# Patient Record
Sex: Male | Born: 1960
Health system: Southern US, Community
[De-identification: ages and names within clinical notes are randomized; demographics above are authoritative.]

## PROBLEM LIST (undated history)

## (undated) ENCOUNTER — Emergency Department: Admission: EM | Payer: BC Managed Care – PPO | Source: Home / Self Care

## (undated) ENCOUNTER — Emergency Department: Admission: EM | Payer: BLUE CROSS/BLUE SHIELD | Source: Home / Self Care

## (undated) DIAGNOSIS — M549 Dorsalgia, unspecified: Secondary | ICD-10-CM

## (undated) DIAGNOSIS — E291 Testicular hypofunction: Secondary | ICD-10-CM

## (undated) DIAGNOSIS — N529 Male erectile dysfunction, unspecified: Secondary | ICD-10-CM

## (undated) DIAGNOSIS — R0602 Shortness of breath: Secondary | ICD-10-CM

## (undated) DIAGNOSIS — J189 Pneumonia, unspecified organism: Secondary | ICD-10-CM

## (undated) DIAGNOSIS — Z9689 Presence of other specified functional implants: Secondary | ICD-10-CM

## (undated) DIAGNOSIS — J45909 Unspecified asthma, uncomplicated: Secondary | ICD-10-CM

## (undated) DIAGNOSIS — I2510 Atherosclerotic heart disease of native coronary artery without angina pectoris: Secondary | ICD-10-CM

## (undated) DIAGNOSIS — F32A Depression, unspecified: Secondary | ICD-10-CM

## (undated) DIAGNOSIS — I739 Peripheral vascular disease, unspecified: Secondary | ICD-10-CM

## (undated) DIAGNOSIS — I10 Essential (primary) hypertension: Secondary | ICD-10-CM

## (undated) DIAGNOSIS — G4700 Insomnia, unspecified: Secondary | ICD-10-CM

## (undated) DIAGNOSIS — G8929 Other chronic pain: Secondary | ICD-10-CM

## (undated) DIAGNOSIS — Z972 Presence of dental prosthetic device (complete) (partial): Secondary | ICD-10-CM

## (undated) DIAGNOSIS — K219 Gastro-esophageal reflux disease without esophagitis: Secondary | ICD-10-CM

## (undated) DIAGNOSIS — F988 Other specified behavioral and emotional disorders with onset usually occurring in childhood and adolescence: Secondary | ICD-10-CM

## (undated) DIAGNOSIS — K635 Polyp of colon: Secondary | ICD-10-CM

## (undated) DIAGNOSIS — E039 Hypothyroidism, unspecified: Secondary | ICD-10-CM

## (undated) DIAGNOSIS — E1142 Type 2 diabetes mellitus with diabetic polyneuropathy: Secondary | ICD-10-CM

## (undated) DIAGNOSIS — K279 Peptic ulcer, site unspecified, unspecified as acute or chronic, without hemorrhage or perforation: Secondary | ICD-10-CM

## (undated) DIAGNOSIS — R7989 Other specified abnormal findings of blood chemistry: Secondary | ICD-10-CM

## (undated) DIAGNOSIS — M1990 Unspecified osteoarthritis, unspecified site: Secondary | ICD-10-CM

## (undated) DIAGNOSIS — M109 Gout, unspecified: Secondary | ICD-10-CM

## (undated) DIAGNOSIS — F1411 Cocaine abuse, in remission: Secondary | ICD-10-CM

## (undated) DIAGNOSIS — F1190 Opioid use, unspecified, uncomplicated: Secondary | ICD-10-CM

## (undated) DIAGNOSIS — G5793 Unspecified mononeuropathy of bilateral lower limbs: Secondary | ICD-10-CM

## (undated) DIAGNOSIS — I209 Angina pectoris, unspecified: Secondary | ICD-10-CM

## (undated) DIAGNOSIS — Z973 Presence of spectacles and contact lenses: Secondary | ICD-10-CM

## (undated) DIAGNOSIS — E785 Hyperlipidemia, unspecified: Secondary | ICD-10-CM

## (undated) DIAGNOSIS — F329 Major depressive disorder, single episode, unspecified: Secondary | ICD-10-CM

## (undated) DIAGNOSIS — G4730 Sleep apnea, unspecified: Secondary | ICD-10-CM

## (undated) DIAGNOSIS — I219 Acute myocardial infarction, unspecified: Secondary | ICD-10-CM

## (undated) DIAGNOSIS — F909 Attention-deficit hyperactivity disorder, unspecified type: Secondary | ICD-10-CM

## (undated) DIAGNOSIS — F419 Anxiety disorder, unspecified: Secondary | ICD-10-CM

## (undated) DIAGNOSIS — Z87442 Personal history of urinary calculi: Secondary | ICD-10-CM

## (undated) DIAGNOSIS — M869 Osteomyelitis, unspecified: Secondary | ICD-10-CM

## (undated) DIAGNOSIS — Z9884 Bariatric surgery status: Secondary | ICD-10-CM

## (undated) DIAGNOSIS — N400 Enlarged prostate without lower urinary tract symptoms: Secondary | ICD-10-CM

## (undated) HISTORY — PX: BACK SURGERY: SHX140

## (undated) HISTORY — PX: FRACTURE SURGERY: SHX138

## (undated) HISTORY — PX: COLONOSCOPY: SHX174

## (undated) HISTORY — PX: KNEE ARTHROSCOPY: SUR90

## (undated) HISTORY — DX: Attention-deficit hyperactivity disorder, unspecified type: F90.9

## (undated) HISTORY — PX: HERNIA REPAIR: SHX51

## (undated) HISTORY — PX: VASECTOMY: SHX75

## (undated) HISTORY — PX: FACIAL FRACTURE SURGERY: SHX1570

## (undated) HISTORY — PX: CORONARY ANGIOPLASTY: SHX604

---

## 2000-04-20 HISTORY — PX: REPAIR TENDONS FOOT: SUR1209

## 2004-03-27 ENCOUNTER — Ambulatory Visit: Payer: Self-pay | Admitting: Podiatry

## 2004-06-18 ENCOUNTER — Inpatient Hospital Stay: Payer: Self-pay | Admitting: Podiatry

## 2006-03-23 ENCOUNTER — Other Ambulatory Visit: Payer: Self-pay

## 2006-03-23 ENCOUNTER — Emergency Department: Payer: Self-pay | Admitting: Emergency Medicine

## 2006-03-29 ENCOUNTER — Ambulatory Visit: Payer: Self-pay | Admitting: Emergency Medicine

## 2006-07-23 ENCOUNTER — Ambulatory Visit: Payer: Self-pay | Admitting: Podiatry

## 2006-12-30 ENCOUNTER — Ambulatory Visit: Payer: Self-pay | Admitting: Internal Medicine

## 2007-04-20 ENCOUNTER — Ambulatory Visit: Payer: Self-pay | Admitting: Internal Medicine

## 2008-05-14 ENCOUNTER — Emergency Department: Payer: Self-pay | Admitting: Emergency Medicine

## 2008-05-15 ENCOUNTER — Ambulatory Visit: Payer: Self-pay | Admitting: Internal Medicine

## 2008-06-13 ENCOUNTER — Ambulatory Visit: Payer: Self-pay | Admitting: Gastroenterology

## 2009-04-20 HISTORY — PX: GASTRIC BYPASS: SHX52

## 2009-07-18 ENCOUNTER — Ambulatory Visit: Payer: Self-pay | Admitting: Unknown Physician Specialty

## 2009-12-26 DIAGNOSIS — E039 Hypothyroidism, unspecified: Secondary | ICD-10-CM | POA: Insufficient documentation

## 2010-01-24 DIAGNOSIS — E119 Type 2 diabetes mellitus without complications: Secondary | ICD-10-CM | POA: Insufficient documentation

## 2010-01-25 DIAGNOSIS — G4733 Obstructive sleep apnea (adult) (pediatric): Secondary | ICD-10-CM | POA: Insufficient documentation

## 2010-04-03 DIAGNOSIS — K5900 Constipation, unspecified: Secondary | ICD-10-CM | POA: Insufficient documentation

## 2010-04-03 DIAGNOSIS — R1115 Cyclical vomiting syndrome unrelated to migraine: Secondary | ICD-10-CM | POA: Insufficient documentation

## 2010-05-09 DIAGNOSIS — Z9884 Bariatric surgery status: Secondary | ICD-10-CM | POA: Insufficient documentation

## 2010-10-26 ENCOUNTER — Observation Stay: Payer: Self-pay | Admitting: Internal Medicine

## 2010-10-27 DIAGNOSIS — I517 Cardiomegaly: Secondary | ICD-10-CM

## 2010-12-24 ENCOUNTER — Ambulatory Visit: Payer: Self-pay | Admitting: Pain Medicine

## 2011-01-26 ENCOUNTER — Ambulatory Visit: Payer: Self-pay | Admitting: Pain Medicine

## 2011-01-29 ENCOUNTER — Ambulatory Visit: Payer: Self-pay | Admitting: Pain Medicine

## 2011-01-30 ENCOUNTER — Inpatient Hospital Stay: Payer: Self-pay | Admitting: *Deleted

## 2011-02-05 ENCOUNTER — Ambulatory Visit: Payer: Self-pay | Admitting: Pain Medicine

## 2011-02-12 ENCOUNTER — Ambulatory Visit: Payer: Self-pay | Admitting: Pain Medicine

## 2011-03-02 ENCOUNTER — Ambulatory Visit: Payer: Self-pay | Admitting: Pain Medicine

## 2011-03-10 ENCOUNTER — Ambulatory Visit: Payer: Self-pay | Admitting: Pain Medicine

## 2011-03-18 ENCOUNTER — Ambulatory Visit: Payer: Self-pay | Admitting: Pain Medicine

## 2011-03-19 ENCOUNTER — Ambulatory Visit: Payer: Self-pay | Admitting: Pain Medicine

## 2011-04-06 ENCOUNTER — Ambulatory Visit: Payer: Self-pay | Admitting: Pain Medicine

## 2011-04-23 ENCOUNTER — Ambulatory Visit: Payer: Self-pay | Admitting: Pain Medicine

## 2011-04-30 ENCOUNTER — Ambulatory Visit: Payer: Self-pay | Admitting: Pain Medicine

## 2011-05-18 ENCOUNTER — Ambulatory Visit: Payer: Self-pay | Admitting: Pain Medicine

## 2011-06-04 ENCOUNTER — Ambulatory Visit: Payer: Self-pay | Admitting: Pain Medicine

## 2011-06-22 ENCOUNTER — Ambulatory Visit: Payer: Self-pay | Admitting: Pain Medicine

## 2011-06-25 ENCOUNTER — Ambulatory Visit: Payer: Self-pay | Admitting: Pain Medicine

## 2011-07-07 ENCOUNTER — Emergency Department: Payer: Self-pay | Admitting: Emergency Medicine

## 2011-07-07 LAB — CBC
HCT: 54.3 % — ABNORMAL HIGH (ref 40.0–52.0)
HGB: 17.7 g/dL (ref 13.0–18.0)
MCH: 26.7 pg (ref 26.0–34.0)
MCHC: 32.5 g/dL (ref 32.0–36.0)
MCV: 82 fL (ref 80–100)
RBC: 6.62 10*6/uL — ABNORMAL HIGH (ref 4.40–5.90)

## 2011-07-07 LAB — URINALYSIS, COMPLETE
Bilirubin,UR: NEGATIVE
Ketone: NEGATIVE
Protein: NEGATIVE
RBC,UR: 1 /HPF (ref 0–5)
Squamous Epithelial: NONE SEEN
WBC UR: 1 /HPF (ref 0–5)

## 2011-07-07 LAB — COMPREHENSIVE METABOLIC PANEL
Alkaline Phosphatase: 68 U/L (ref 50–136)
BUN: 14 mg/dL (ref 7–18)
Bilirubin,Total: 0.6 mg/dL (ref 0.2–1.0)
Chloride: 102 mmol/L (ref 98–107)
Creatinine: 1.33 mg/dL — ABNORMAL HIGH (ref 0.60–1.30)
Osmolality: 276 (ref 275–301)
SGPT (ALT): 25 U/L
Sodium: 137 mmol/L (ref 136–145)
Total Protein: 7 g/dL (ref 6.4–8.2)

## 2011-07-07 LAB — LIPASE, BLOOD: Lipase: 158 U/L (ref 73–393)

## 2011-07-13 ENCOUNTER — Ambulatory Visit: Payer: Self-pay | Admitting: Pain Medicine

## 2011-07-29 ENCOUNTER — Ambulatory Visit: Payer: Self-pay | Admitting: Pain Medicine

## 2011-08-18 ENCOUNTER — Encounter: Payer: Self-pay | Admitting: Pain Medicine

## 2011-08-19 ENCOUNTER — Encounter: Payer: Self-pay | Admitting: Pain Medicine

## 2011-08-20 ENCOUNTER — Ambulatory Visit: Payer: Self-pay | Admitting: Pain Medicine

## 2011-09-02 ENCOUNTER — Ambulatory Visit: Payer: Self-pay | Admitting: Pain Medicine

## 2011-09-13 ENCOUNTER — Emergency Department: Payer: Self-pay | Admitting: *Deleted

## 2011-09-19 ENCOUNTER — Encounter: Payer: Self-pay | Admitting: Pain Medicine

## 2011-09-19 HISTORY — PX: SPINAL CORD STIMULATOR IMPLANT: SHX2422

## 2011-09-24 ENCOUNTER — Ambulatory Visit: Payer: Self-pay

## 2011-10-09 ENCOUNTER — Ambulatory Visit: Payer: Self-pay | Admitting: Pain Medicine

## 2011-10-09 DIAGNOSIS — R9431 Abnormal electrocardiogram [ECG] [EKG]: Secondary | ICD-10-CM

## 2011-10-20 ENCOUNTER — Ambulatory Visit: Payer: Self-pay | Admitting: Pain Medicine

## 2011-11-15 ENCOUNTER — Emergency Department: Payer: Self-pay | Admitting: Emergency Medicine

## 2011-11-30 ENCOUNTER — Ambulatory Visit: Payer: Self-pay | Admitting: Pain Medicine

## 2011-12-18 ENCOUNTER — Encounter (HOSPITAL_BASED_OUTPATIENT_CLINIC_OR_DEPARTMENT_OTHER): Payer: Self-pay | Admitting: *Deleted

## 2011-12-18 NOTE — Progress Notes (Signed)
Pt used cpap-lost 140 lb after gastric bypass-uses 02 at night via n/c Had dr fitzgerald see pt-may need to stay post op-also can send mask home to use with 02 machine To bring all meds-overnight bag Not diabetic or has htn.

## 2011-12-22 ENCOUNTER — Ambulatory Visit (HOSPITAL_BASED_OUTPATIENT_CLINIC_OR_DEPARTMENT_OTHER): Payer: BC Managed Care – PPO | Admitting: Anesthesiology

## 2011-12-22 ENCOUNTER — Encounter (HOSPITAL_BASED_OUTPATIENT_CLINIC_OR_DEPARTMENT_OTHER): Payer: Self-pay

## 2011-12-22 ENCOUNTER — Encounter (HOSPITAL_BASED_OUTPATIENT_CLINIC_OR_DEPARTMENT_OTHER): Payer: Self-pay | Admitting: Anesthesiology

## 2011-12-22 ENCOUNTER — Ambulatory Visit (HOSPITAL_BASED_OUTPATIENT_CLINIC_OR_DEPARTMENT_OTHER)
Admission: RE | Admit: 2011-12-22 | Discharge: 2011-12-22 | Disposition: A | Discharge status: Home / Self Care | Payer: BC Managed Care – PPO | Source: Ambulatory Visit | Attending: Otolaryngology | Admitting: Otolaryngology

## 2011-12-22 ENCOUNTER — Encounter (HOSPITAL_BASED_OUTPATIENT_CLINIC_OR_DEPARTMENT_OTHER)
Admission: RE | Disposition: A | Discharge status: Home / Self Care | Payer: Self-pay | Source: Ambulatory Visit | Attending: Otolaryngology

## 2011-12-22 DIAGNOSIS — E039 Hypothyroidism, unspecified: Secondary | ICD-10-CM | POA: Insufficient documentation

## 2011-12-22 DIAGNOSIS — F3289 Other specified depressive episodes: Secondary | ICD-10-CM | POA: Insufficient documentation

## 2011-12-22 DIAGNOSIS — S022XXA Fracture of nasal bones, initial encounter for closed fracture: Secondary | ICD-10-CM

## 2011-12-22 DIAGNOSIS — K219 Gastro-esophageal reflux disease without esophagitis: Secondary | ICD-10-CM | POA: Insufficient documentation

## 2011-12-22 DIAGNOSIS — F329 Major depressive disorder, single episode, unspecified: Secondary | ICD-10-CM | POA: Insufficient documentation

## 2011-12-22 DIAGNOSIS — Z6839 Body mass index (BMI) 39.0-39.9, adult: Secondary | ICD-10-CM | POA: Insufficient documentation

## 2011-12-22 DIAGNOSIS — X58XXXA Exposure to other specified factors, initial encounter: Secondary | ICD-10-CM | POA: Insufficient documentation

## 2011-12-22 DIAGNOSIS — G4730 Sleep apnea, unspecified: Secondary | ICD-10-CM | POA: Insufficient documentation

## 2011-12-22 DIAGNOSIS — F411 Generalized anxiety disorder: Secondary | ICD-10-CM | POA: Insufficient documentation

## 2011-12-22 HISTORY — DX: Dorsalgia, unspecified: M54.9

## 2011-12-22 HISTORY — DX: Sleep apnea, unspecified: G47.30

## 2011-12-22 HISTORY — DX: Unspecified osteoarthritis, unspecified site: M19.90

## 2011-12-22 HISTORY — DX: Major depressive disorder, single episode, unspecified: F32.9

## 2011-12-22 HISTORY — DX: Peptic ulcer, site unspecified, unspecified as acute or chronic, without hemorrhage or perforation: K27.9

## 2011-12-22 HISTORY — DX: Anxiety disorder, unspecified: F41.9

## 2011-12-22 HISTORY — DX: Shortness of breath: R06.02

## 2011-12-22 HISTORY — PX: CLOSED REDUCTION NASAL FRACTURE: SHX5365

## 2011-12-22 HISTORY — DX: Depression, unspecified: F32.A

## 2011-12-22 HISTORY — DX: Gastro-esophageal reflux disease without esophagitis: K21.9

## 2011-12-22 HISTORY — DX: Other specified abnormal findings of blood chemistry: R79.89

## 2011-12-22 HISTORY — DX: Presence of spectacles and contact lenses: Z97.3

## 2011-12-22 HISTORY — DX: Other chronic pain: G89.29

## 2011-12-22 HISTORY — DX: Insomnia, unspecified: G47.00

## 2011-12-22 HISTORY — DX: Presence of other specified functional implants: Z96.89

## 2011-12-22 HISTORY — DX: Hypothyroidism, unspecified: E03.9

## 2011-12-22 SURGERY — CLOSED REDUCTION, FRACTURE, NASAL BONE
Anesthesia: General | Site: Nose | Wound class: Clean Contaminated

## 2011-12-22 MED ORDER — FENTANYL CITRATE 0.05 MG/ML IJ SOLN
INTRAMUSCULAR | Status: DC | PRN
  Administered 2011-12-22: 100 ug via INTRAVENOUS

## 2011-12-22 MED ORDER — HYDROMORPHONE HCL PF 1 MG/ML IJ SOLN
0.2500 mg | INTRAMUSCULAR | Status: DC | PRN
  Administered 2011-12-22 (×4): 0.5 mg via INTRAVENOUS

## 2011-12-22 MED ORDER — ONDANSETRON HCL 4 MG/2ML IJ SOLN
INTRAMUSCULAR | Status: DC | PRN
  Administered 2011-12-22: 4 mg via INTRAVENOUS

## 2011-12-22 MED ORDER — BSS IO SOLN
INTRAOCULAR | Status: DC | PRN
  Administered 2011-12-22: 1 via INTRAOCULAR

## 2011-12-22 MED ORDER — OXYMETAZOLINE HCL 0.05 % NA SOLN
NASAL | Status: DC | PRN
  Administered 2011-12-22: 1

## 2011-12-22 MED ORDER — MIDAZOLAM HCL 5 MG/5ML IJ SOLN
INTRAMUSCULAR | Status: DC | PRN
  Administered 2011-12-22: 2 mg via INTRAVENOUS

## 2011-12-22 MED ORDER — PROPOFOL 10 MG/ML IV BOLUS
INTRAVENOUS | Status: DC | PRN
  Administered 2011-12-22: 300 mg via INTRAVENOUS

## 2011-12-22 MED ORDER — DEXAMETHASONE SODIUM PHOSPHATE 4 MG/ML IJ SOLN
INTRAMUSCULAR | Status: DC | PRN
  Administered 2011-12-22: 10 mg via INTRAVENOUS

## 2011-12-22 MED ORDER — LACTATED RINGERS IV SOLN
INTRAVENOUS | Status: DC | PRN
  Administered 2011-12-22: 10:00:00 via INTRAVENOUS

## 2011-12-22 MED ORDER — LACTATED RINGERS IV SOLN
INTRAVENOUS | Status: DC
  Administered 2011-12-22: 10:00:00 via INTRAVENOUS
  Administered 2011-12-22: 10 mL/h via INTRAVENOUS

## 2011-12-22 MED ORDER — ONDANSETRON HCL 4 MG/2ML IJ SOLN: 4.0000 mg | Freq: Four times a day (QID) | INTRAMUSCULAR | Status: DC | PRN

## 2011-12-22 MED ORDER — LIDOCAINE HCL (CARDIAC) 20 MG/ML IV SOLN
INTRAVENOUS | Status: DC | PRN
  Administered 2011-12-22: 100 mg via INTRAVENOUS

## 2011-12-22 SURGICAL SUPPLY — 33 items
BENZOIN TINCTURE PRP APPL 2/3 (GAUZE/BANDAGES/DRESSINGS) ×4 IMPLANT
CANISTER SUCTION 1200CC (MISCELLANEOUS) ×2 IMPLANT
CLOTH BEACON ORANGE TIMEOUT ST (SAFETY) ×2 IMPLANT
CONT SPEC 4OZ CLIKSEAL STRL BL (MISCELLANEOUS) IMPLANT
DECANTER SPIKE VIAL GLASS SM (MISCELLANEOUS) IMPLANT
DEPRESSOR TONGUE BLADE STERILE (MISCELLANEOUS) ×2 IMPLANT
DRESSING ADAPTIC 1/2  N-ADH (PACKING) IMPLANT
DRESSING NASAL KENNEDY 3.5X.9 (MISCELLANEOUS) IMPLANT
DRSG NASAL KENNEDY 3.5X.9 (MISCELLANEOUS)
DRSG TELFA 3X8 NADH (GAUZE/BANDAGES/DRESSINGS) IMPLANT
GAUZE SPONGE 4X4 12PLY STRL LF (GAUZE/BANDAGES/DRESSINGS) IMPLANT
GLOVE BIO SURGEON STRL SZ7.5 (GLOVE) ×2 IMPLANT
GLOVE BIOGEL PI IND STRL 7.5 (GLOVE) ×2 IMPLANT
GLOVE BIOGEL PI INDICATOR 7.5 (GLOVE) ×2
KIT SPLINT NASAL DENVER LRG BE (GAUZE/BANDAGES/DRESSINGS) IMPLANT
KIT SPLINT NASAL DENVER MIN BE (GAUZE/BANDAGES/DRESSINGS) IMPLANT
KIT SPLINT NASAL DENVER PET BE (GAUZE/BANDAGES/DRESSINGS) ×4 IMPLANT
KIT SPLINT NASAL DENVER SM BEI (GAUZE/BANDAGES/DRESSINGS) IMPLANT
MARKER SKIN DUAL TIP RULER LAB (MISCELLANEOUS) IMPLANT
NEEDLE 27GAX1X1/2 (NEEDLE) IMPLANT
NS IRRIG 1000ML POUR BTL (IV SOLUTION) IMPLANT
PACK BASIN DAY SURGERY FS (CUSTOM PROCEDURE TRAY) ×2 IMPLANT
SHEET MEDIUM DRAPE 40X70 STRL (DRAPES) ×2 IMPLANT
SPONGE GAUZE 2X2 8PLY STRL LF (GAUZE/BANDAGES/DRESSINGS) IMPLANT
SPONGE NEURO XRAY DETECT 1X3 (DISPOSABLE) ×2 IMPLANT
STRIP SUTURE WOUND CLOSURE 1/2 (SUTURE) IMPLANT
SUT ETHILON 3 0 PS 1 (SUTURE) IMPLANT
SYR CONTROL 10ML LL (SYRINGE) IMPLANT
TAPE PAPER 1/2X10 TAN MEDIPORE (MISCELLANEOUS) ×2 IMPLANT
TOWEL OR 17X24 6PK STRL BLUE (TOWEL DISPOSABLE) ×2 IMPLANT
TUBE CONNECTING 20X1/4 (TUBING) ×2 IMPLANT
TUBE SALEM SUMP 12R W/ARV (TUBING) IMPLANT
YANKAUER SUCT BULB TIP NO VENT (SUCTIONS) IMPLANT

## 2011-12-22 NOTE — H&P (Signed)
  H&P Update  Pt's original H&P dated 12/18/11 reviewed and placed in chart (to be scanned).  I personally examined the patient today.  No change in health. Proceed with closed reduction of nasal fractures.

## 2011-12-22 NOTE — Progress Notes (Signed)
Dr Suszanne Conners informed of pt request for further pain medication. No new script given pt is to use motrin and tylenol alternating for pain management.

## 2011-12-22 NOTE — Transfer of Care (Signed)
Immediate Anesthesia Transfer of Care Note  Patient: Charles [name]  Procedure(s) Performed: Procedure(s) (LRB) with comments: CLOSED REDUCTION NASAL FRACTURE (N/A) - closed reduction of nasal fracture  Patient Location: PACU  Anesthesia Type: General  Level of Consciousness: awake and alert   Airway & Oxygen Therapy: Patient Spontanous Breathing and Patient connected to face mask oxygen  Post-op Assessment: Report given to PACU RN and Post -op Vital signs reviewed and stable  Post vital signs: Reviewed and stable  Complications: No apparent anesthesia complications

## 2011-12-22 NOTE — Anesthesia Preprocedure Evaluation (Signed)
Anesthesia Evaluation  Patient identified by MRN, date of birth, ID band Patient awake    Reviewed: Allergy & Precautions, H&P , NPO status , Patient's Chart, lab work & pertinent test results  Airway Mallampati: II  Neck ROM: full    Dental   Pulmonary sleep apnea ,          Cardiovascular     Neuro/Psych Anxiety Depression    GI/Hepatic PUD, GERD-  ,  Endo/Other  Hypothyroidism Morbid obesity  Renal/GU      Musculoskeletal   Abdominal   Peds  Hematology   Anesthesia Other Findings   Reproductive/Obstetrics                           Anesthesia Physical Anesthesia Plan  ASA: III  Anesthesia Plan: General   Post-op Pain Management:    Induction: Intravenous  Airway Management Planned: LMA  Additional Equipment:   Intra-op Plan:   Post-operative Plan:   Informed Consent: I have reviewed the patients History and Physical, chart, labs and discussed the procedure including the risks, benefits and alternatives for the proposed anesthesia with the patient or authorized representative who has indicated his/her understanding and acceptance.     Plan Discussed with: CRNA and Surgeon  Anesthesia Plan Comments:         Anesthesia Quick Evaluation

## 2011-12-22 NOTE — Brief Op Note (Signed)
12/22/2011  10:33 AM  PATIENT:  Charles [name]  51 y.o. male  PRE-OPERATIVE DIAGNOSIS:  Depressed nasal fracture  POST-OPERATIVE DIAGNOSIS:  Depressed nasal fracture  PROCEDURE:  Closed reduction of nasal fracture with stabilization  SURGEON:  Surgeon(s) and Role:    * Sui W Joy Haegele, MD - Primary  PHYSICIAN ASSISTANT:   ASSISTANTS: none   ANESTHESIA:   general  EBL:  Total I/O In: 700 [I.V.:700] Out: -   BLOOD ADMINISTERED:none  DRAINS: none   LOCAL MEDICATIONS USED:  NONE  SPECIMEN:  No Specimen  DISPOSITION OF SPECIMEN:  N/A  COUNTS:  YES  TOURNIQUET:  * No tourniquets in log *  DICTATION: .Other Dictation: Dictation Number [id number]  PLAN OF CARE: Discharge to home after PACU  PATIENT DISPOSITION:  PACU - hemodynamically stable.   Delay start of Pharmacological VTE agent (>24hrs) due to surgical blood loss or risk of bleeding: not applicable

## 2011-12-22 NOTE — Anesthesia Postprocedure Evaluation (Signed)
Anesthesia Post Note  Patient: Charles [name]  Procedure(s) Performed: Procedure(s) (LRB): CLOSED REDUCTION NASAL FRACTURE (N/A)  Anesthesia type: General  Patient location: PACU  Post pain: Pain level controlled and Adequate analgesia  Post assessment: Post-op Vital signs reviewed, Patient's Cardiovascular Status Stable, Respiratory Function Stable, Patent Airway and Pain level controlled  Last Vitals:  Filed Vitals:   12/22/11 1100  BP: 139/80  Pulse: 69  Temp:   Resp: 13    Post vital signs: Reviewed and stable  Level of consciousness: awake, alert  and oriented  Complications: No apparent anesthesia complications

## 2011-12-22 NOTE — Anesthesia Procedure Notes (Signed)
Procedure Name: LMA Insertion Date/Time: 12/22/2011 10:07 AM Performed by: Caren Macadam Pre-anesthesia Checklist: Patient identified, Emergency Drugs available, Suction available and Patient being monitored Patient Re-evaluated:Patient Re-evaluated prior to inductionOxygen Delivery Method: Circle system utilized Preoxygenation: Pre-oxygenation with 100% oxygen Intubation Type: IV induction Ventilation: Mask ventilation without difficulty LMA: LMA flexible inserted LMA Size: 5.0 Tube type: Oral Number of attempts: 1 Placement Confirmation: breath sounds checked- equal and bilateral and positive ETCO2 Tube secured with: Tape Dental Injury: Teeth and Oropharynx as per pre-operative assessment

## 2011-12-23 ENCOUNTER — Encounter (HOSPITAL_BASED_OUTPATIENT_CLINIC_OR_DEPARTMENT_OTHER): Payer: Self-pay | Admitting: Otolaryngology

## 2011-12-23 ENCOUNTER — Ambulatory Visit: Payer: Self-pay | Admitting: Pain Medicine

## 2011-12-23 NOTE — Op Note (Signed)
Charles [name], Charles [name]              ACCOUNT NO.:  [id number]  MEDICAL RECORD NO.:  [id number]  LOCATION:                                 FACILITY:  PHYSICIAN:  Newman Pies, MD                 DATE OF BIRTH:  DATE OF PROCEDURE:  12/22/2011 DATE OF DISCHARGE:                              OPERATIVE REPORT   SURGEON:  Newman Pies, MD  PREOPERATIVE DIAGNOSIS:  Depressed nasal fracture.  POSTOPERATIVE DIAGNOSIS:  Depressed nasal fracture.  PROCEDURE PERFORMED:  Closed reduction of nasal fracture with stabilization.  ANESTHESIA:  General laryngeal mask anesthesia.  COMPLICATIONS:  None.  ESTIMATED BLOOD LOSS:  Minimal.  INDICATION FOR THE PROCEDURE:  The patient is a 51 year old male with a history of nasal trauma and nasal fracture 2 weeks ago.  It resulted in a depressed left nasal bone fracture.  As a result, the decision was made for the patient to undergo closed reduction of his nasal fracture with stabilization.  The risks, benefits, alternatives, and details of the procedure were discussed with the patient.  Questions were invited and answered.  Informed consent was obtained.  DESCRIPTION:  The patient was taken to the operating room and placed supine on the operating table.  General anesthesia was administered by the anesthesiologist.  Pledgets soaked with Afrin were placed in both nasal cavities.  The pledgets were subsequently removed.  Nasal examination showed depressed left nasal bone fragment with nasal dorsal deviation to the right.  Using a nasal bone elevator, the left nasal bone was carefully elevated.  Manual pressure was applied on the right side to reduce the dorsal deviation.  Good reduction of the of the fracture was noted.  A [place] splint was applied to the nasal dorsum for stabilization.  The care of the patient was turned over to the anesthesiologist.  The patient was awakened from anesthesia without difficulty.  He was transferred to the recovery room in good  condition.  OPERATIVE FINDINGS:  Depressed left nasal bone fracture, resulting in slight nasal dorsal deviation to the right.  SPECIMEN:  None.  FOLLOWUP CARE:  The patient will be discharged home once he is awake and alert.  He will follow up in my office in 1 week.     Newman Pies, MD     ST/MEDQ  D:  12/22/2011  T:  12/23/2011  Job:  [id number]

## 2012-01-27 DIAGNOSIS — N401 Enlarged prostate with lower urinary tract symptoms: Secondary | ICD-10-CM | POA: Insufficient documentation

## 2012-01-27 DIAGNOSIS — Z79899 Other long term (current) drug therapy: Secondary | ICD-10-CM | POA: Insufficient documentation

## 2012-01-27 DIAGNOSIS — N400 Enlarged prostate without lower urinary tract symptoms: Secondary | ICD-10-CM | POA: Insufficient documentation

## 2012-01-27 DIAGNOSIS — R350 Frequency of micturition: Secondary | ICD-10-CM | POA: Insufficient documentation

## 2012-01-27 DIAGNOSIS — E236 Other disorders of pituitary gland: Secondary | ICD-10-CM | POA: Insufficient documentation

## 2012-01-27 DIAGNOSIS — R351 Nocturia: Secondary | ICD-10-CM | POA: Insufficient documentation

## 2012-01-27 DIAGNOSIS — N529 Male erectile dysfunction, unspecified: Secondary | ICD-10-CM | POA: Insufficient documentation

## 2012-04-25 ENCOUNTER — Ambulatory Visit: Payer: Self-pay | Admitting: Pain Medicine

## 2012-04-26 ENCOUNTER — Ambulatory Visit: Payer: Self-pay | Admitting: Pain Medicine

## 2012-05-05 ENCOUNTER — Ambulatory Visit: Payer: Self-pay | Admitting: Pain Medicine

## 2012-05-12 ENCOUNTER — Ambulatory Visit: Payer: Self-pay | Admitting: Pain Medicine

## 2012-05-30 ENCOUNTER — Ambulatory Visit: Payer: Self-pay | Admitting: Pain Medicine

## 2012-07-05 ENCOUNTER — Ambulatory Visit: Payer: Self-pay | Admitting: Pain Medicine

## 2012-08-09 ENCOUNTER — Ambulatory Visit: Payer: Self-pay | Admitting: Pain Medicine

## 2012-08-24 ENCOUNTER — Ambulatory Visit: Payer: Self-pay | Admitting: Pain Medicine

## 2012-08-29 DIAGNOSIS — S2249XA Multiple fractures of ribs, unspecified side, initial encounter for closed fracture: Secondary | ICD-10-CM | POA: Insufficient documentation

## 2012-08-29 DIAGNOSIS — S2239XA Fracture of one rib, unspecified side, initial encounter for closed fracture: Secondary | ICD-10-CM | POA: Insufficient documentation

## 2012-08-29 DIAGNOSIS — J939 Pneumothorax, unspecified: Secondary | ICD-10-CM | POA: Insufficient documentation

## 2012-08-29 DIAGNOSIS — S82209A Unspecified fracture of shaft of unspecified tibia, initial encounter for closed fracture: Secondary | ICD-10-CM | POA: Insufficient documentation

## 2012-08-31 DIAGNOSIS — E119 Type 2 diabetes mellitus without complications: Secondary | ICD-10-CM | POA: Insufficient documentation

## 2012-08-31 DIAGNOSIS — S272XXA Traumatic hemopneumothorax, initial encounter: Secondary | ICD-10-CM | POA: Insufficient documentation

## 2012-08-31 DIAGNOSIS — Z9884 Bariatric surgery status: Secondary | ICD-10-CM | POA: Insufficient documentation

## 2012-08-31 DIAGNOSIS — M6282 Rhabdomyolysis: Secondary | ICD-10-CM | POA: Insufficient documentation

## 2012-08-31 DIAGNOSIS — N179 Acute kidney failure, unspecified: Secondary | ICD-10-CM | POA: Insufficient documentation

## 2012-09-02 DIAGNOSIS — F1410 Cocaine abuse, uncomplicated: Secondary | ICD-10-CM | POA: Insufficient documentation

## 2012-09-06 DIAGNOSIS — S82402A Unspecified fracture of shaft of left fibula, initial encounter for closed fracture: Secondary | ICD-10-CM | POA: Insufficient documentation

## 2012-09-06 DIAGNOSIS — S42002A Fracture of unspecified part of left clavicle, initial encounter for closed fracture: Secondary | ICD-10-CM | POA: Insufficient documentation

## 2012-09-06 DIAGNOSIS — S43396A Dislocation of other parts of unspecified shoulder girdle, initial encounter: Secondary | ICD-10-CM | POA: Insufficient documentation

## 2012-09-06 DIAGNOSIS — S22009A Unspecified fracture of unspecified thoracic vertebra, initial encounter for closed fracture: Secondary | ICD-10-CM | POA: Insufficient documentation

## 2012-09-06 DIAGNOSIS — T797XXA Traumatic subcutaneous emphysema, initial encounter: Secondary | ICD-10-CM | POA: Insufficient documentation

## 2012-09-06 DIAGNOSIS — S022XXA Fracture of nasal bones, initial encounter for closed fracture: Secondary | ICD-10-CM | POA: Insufficient documentation

## 2012-09-06 DIAGNOSIS — S27329A Contusion of lung, unspecified, initial encounter: Secondary | ICD-10-CM | POA: Insufficient documentation

## 2012-09-07 DIAGNOSIS — D62 Acute posthemorrhagic anemia: Secondary | ICD-10-CM | POA: Insufficient documentation

## 2012-09-09 DIAGNOSIS — R0902 Hypoxemia: Secondary | ICD-10-CM | POA: Insufficient documentation

## 2012-09-09 DIAGNOSIS — K255 Chronic or unspecified gastric ulcer with perforation: Secondary | ICD-10-CM | POA: Insufficient documentation

## 2012-09-16 DIAGNOSIS — T1490XA Injury, unspecified, initial encounter: Secondary | ICD-10-CM | POA: Insufficient documentation

## 2012-10-17 ENCOUNTER — Ambulatory Visit: Payer: Self-pay | Admitting: Pain Medicine

## 2012-12-05 ENCOUNTER — Ambulatory Visit: Payer: Self-pay | Admitting: Pain Medicine

## 2013-01-19 DIAGNOSIS — S42009A Fracture of unspecified part of unspecified clavicle, initial encounter for closed fracture: Secondary | ICD-10-CM | POA: Insufficient documentation

## 2013-01-27 ENCOUNTER — Emergency Department: Payer: Self-pay | Admitting: Emergency Medicine

## 2013-01-27 LAB — COMPREHENSIVE METABOLIC PANEL
Albumin: 3.5 g/dL (ref 3.4–5.0)
Alkaline Phosphatase: 129 U/L (ref 50–136)
BUN: 16 mg/dL (ref 7–18)
EGFR (African American): >60
EGFR (Non-African Amer.): >60
Osmolality: 280 (ref 275–301)
Potassium: 3.7 mmol/L (ref 3.5–5.1)
SGOT(AST): 13 U/L — ABNORMAL LOW (ref 15–37)
Sodium: 140 mmol/L (ref 136–145)
Total Protein: 6.9 g/dL (ref 6.4–8.2)

## 2013-01-27 LAB — URINALYSIS, COMPLETE
Bilirubin,UR: NEGATIVE
Nitrite: NEGATIVE
Ph: 6 (ref 4.5–8.0)
Protein: NEGATIVE
RBC,UR: 45 /HPF (ref 0–5)
Specific Gravity: 1.004 (ref 1.003–1.030)
Squamous Epithelial: NONE SEEN
WBC UR: <1 /HPF (ref 0–5)

## 2013-01-27 LAB — CBC
HGB: 11.7 g/dL — ABNORMAL LOW (ref 13.0–18.0)
MCV: 71 fL — ABNORMAL LOW (ref 80–100)
RBC: 5.27 10*6/uL (ref 4.40–5.90)
RDW: 20 % — ABNORMAL HIGH (ref 11.5–14.5)

## 2013-05-18 DIAGNOSIS — M25562 Pain in left knee: Secondary | ICD-10-CM | POA: Insufficient documentation

## 2014-08-12 NOTE — Op Note (Signed)
PATIENT NAME:  Charles [name], Charles [name] MR#:  [id number] DATE OF BIRTH:  06-11-1960 ACCOUNT NUMBER: [id number]  DATE OF PROCEDURE:  04/23/2011  Location: Operating Room Referring Physician: Duard Brady, MD  Procedure by: Kathlen Brunswick. Dossie Arbour, MD  Note: This is the case of a 55 year old obese white male patient who comes into the clinic today for a bilateral lumbar spinal cord stimulator trial implant under fluoroscopic guidance.   Procedure(s):  1. Temporary (Trial) implantation of Lumbar Epidural, Double Percutaneous Neurostimulator Leads (16 electrode array). 2. Fluoroscopic Needle Guidance 3. Intraoperative Analysis and Programming. 4. Postoperative Analysis and Programming. 5. Moderate Conscious Sedation  Surgeon: Aneisha Skyles A. Dossie Arbour, M.D. Side of implant: Bilateral Top electrode tip level: On both sides the top electrodes could be found at the upper portion of the T8 vertebral body. Diagnostic Indications: Chronic bilateral lower extremity pain.  Bilateral knee osteoarthritis. Right foot pain secondary to an ankle fracture and fusion. Diskogenic low back pain. Lumbar degenerative joint disease and degenerative disk disease. Position: Prone.  Prepping solution: DuraPrep Area prepped: The thoracolumbar and sacral areas, were prepped with a broad-spectrum topical antiseptic microbicide. Target area: Lumbar Epidural Space, around the T8-9 vertebral body, for the electrode tip. Insertion site is the L2-3 intervertebral space. Level entered: L1-2. Number of attempts: one  Infection Control: Standard Universal Precautions taken (Respiratory Hygiene/Cough Etiquette; Mouth, nose, eye protection; Hand Hygiene; Personal protective equipment (PPE); safe injection practices; and use of masks and disposable sterile surgical gloves) as recommended by the Department of [place] for Disease Control and Prevention (CDC).  Safety Measures: Allergies were reviewed. Appropriate site, procedure,  and patient were confirmed by following the Joint Commission's Universal Protocol (UP.01.01.01). The patient was asked to confirm marked site and procedure, before commencing. The patient was asked about blood thinners, or active infections, both of which were denied. No attempt was made at seeking any paresthesias. Aspiration looking for blood return was conducted prior to injecting. At no point did we inject any substances, as a needle was being advanced.  Pre-procedure Assessment:  A medical history and physical exam were obtained. Relevant documentation was reviewed and verified. Prior to the procedure, the patient was provided with an Audio CD, as well as written information on the procedure, including side-effects, and possible complications. Under the influence of no sedatives, a verbal, as well as a written informed consent were obtained, after having provided information on the risks and possible complications. To fulfill our ethical and legal obligations, as recommended by the American Medical Association's Code of Ethics, we have provided information to the patient about our clinical impression; the nature and purpose of an available treatment or procedure; the risks and benefits of an available treatment or procedure; alternatives; the risk and benefits of the alternative treatment or procedure; and the risks and benefits of not receiving or undergoing a treatment or procedure. The patient was provided information about the risks and possible complications associated with the procedure. These include, but not limited to, failure to achieve desired goals, infection, bleeding, organ or nerve damage, allergic reactions, paralysis, and death. In addition, the patient was informed that Medicine is not an exact science; therefore, there is also the possibility of unforeseen risks and possible complications that may result in a catastrophic outcome. The patient indicated having understood very clearly.  We  have given the patient no guarantees and we have made no promises. Ample time was given to the patient to ask questions, all of which were answered, to the patient's  satisfaction, before proceeding. The patient understands that by signing our informed consent form, they understand and accept the risks and the fact that it is impossible to predict all possible complications. Baseline vital signs were taken and the medical assessment was completed. Verification of the correct person, correct site (including marking of site), and correct procedure were performed and confirmed by the patient. Baseline vital signs were taken and the initial assessment was completed. Verification of the correct person, correct site (including marking of site), and correct procedure were performed and confirmed by the patient, in the form of a "Time Out".  Monitoring: The patient was monitored in the usual manner, using NIBPM, ECG, and pulse oximetry.  IV Access:  An IV access was obtained and secured.  Analgesia:  Moderate (Conscious) Intravenous sedation: Consent was obtained before administering any sedation. Availability of a responsible, adult driver, and NPO status confirmed. Meaningful verbal contact was maintained, with the patient at all times during the procedure. ASA Sedation Guidelines followed. For specifics on pharmacological type and quantity of sedation, please see nursing chart.  Prophylactic Antibiotics:  Cefazolin (1st generation cephalosporin) 1 gm IVPB.  Local Anesthesia: Lidocaine 1%. The skin over the procedure site were infiltrated using a 3 ml Luer-Lok syringe with a 0.5 inch, 25-G needle. Deeper tissues were infiltrated using a 3.0 inch, 22-G spinal needle, under fluoroscopic guidance.  Fluoroscopy: The patient was taken to the operative suite, where the patient was placed in position for the procedure, over the fluoroscopy compatible table. Fluoroscopy was manipulated, using "Tunnel Vision  Technique", to obtain the best possible view of the target area, on the affected side. Parallax error was corrected before commencing the procedure. Gabor Racz's "Direction-Depth-Direction" technique was used to introduce the procedural needle under continuous pulsed fluoroscopic guidance. Once the target was reached, antero-posterior and lateral fluoroscopic views were taken to confirm needle placement in two planes. Fluoroscopy time: Please see the patient's chart for details.  Description of the procedure: The procedure site was prepped using a broad-spectrum topical antiseptic. The area was then draped in the usual and standard manner. "Time-out" was performed as per JC Universal Protocol (UP.01.01.01).   The skin and deeper tissues over the procedure site were infiltrated using 1% lidocaine, loaded in a 10 ml Luer-Lok syringe with a 0.5 inch, 25-G needle. The procedural needle was then introduced through the skin and deeper tissues, using Gabor Racz's "Direction-Depth-Direction" technique, under pulsed fluoroscopic guidance. No attempt was made at seeking a paresthesia. The paramidline approach was used to enter the posterior lumbar epidural space at a 30 degree angle, using "Loss-of-resistance Technique" with 3 ml of PF-NaCl (0.9% NSS) + 0.5 ml of air, in a 5 ml glass syringe, using a "loss-of-bounce technique", at the desired level. Correct needle placement was confirmed in the  antero-posterior and lateral fluoroscopic views. The epidural lead was gently introduced under real-time fluoroscopy, constantly assessing for pain or paresthesias, until the tip was observed to be at the target level, on the side ipsilateral to the pain. The placement of the second lead was accomplished in a similar fashion. Once the target was thought to have been reached, antero-posterior and lateral fluoroscopic views were taken to confirm electrode placement in two planes. Placement was tested until a comfortable stimulation  pattern was observed over the usual painful area. Once the patient had assured [place] that the stimulation was in the correct pattern, and distribution, we proceeded to remove the 15-G "Tuohy" epidural needle(s). This was done while observing the  electrode tip(s) under real-time fluoroscopy to prevent movement. The leads were then fixed to the skin using silk 2-0 suture. Benzoin was applied to the area, and 6 (six) 9M, 1/2"X4", reinforced, adhesive Steri Strips were used to further secure the lead into place. A sterile transparent dressing was then used to cover the lead, in order to assess any evidence of infection in the future.  The patient tolerated the entire procedure well. A repeat set of vitals were taken after the procedure and the patient was kept under observation until discharge criteria was met. The patient was provided with discharge instructions, including a section on how to identify potential problems. Should any problems arise concerning this procedure, the patient was given instructions to immediately contact us, without hesitation. The neurostimulator representative and I, both provided the patient with our Business cards containing our contact telephone numbers, and instructed the patient to contact either one of [place], at any time, should there be any problems or questions. In any case, we plan to contact the patient by telephone for a follow-up status report regarding this interventional procedure.  EBL: None.  Complications: No heme; no paresthesias.  Disposition: Return to clinics in 5-7 days for removal of trial electrodes and evaluation of trial.  Additional Comments/Plan: None.  Equipment used:  Medtronic Compact Lead on the right side and Subcompact on the left side with eight electrodes each for a total of 16 electrodes.  Disclaimer: Medicine is not an Chief Strategy Officer. The only guarantee in medicine is that nothing is guaranteed. It is important to note that the decision to proceed  with this intervention was based on the information collected from the patient. The Data and conclusions were drawn from the patient's questionnaire, the interview, and the physical examination. Because the information was provided in large part by the patient, it cannot be guaranteed that it has not been purposely or unconsciously manipulated. Every effort has been made to obtain as much relevant data as possible for this evaluation. It is important to note that the conclusions that lead to this procedure are derived in large part from the available data. Always take into account that the treatment will also be dependent on availability of resources and existing treatment guidelines, considered by other Pain Management Practitioners as being common knowledge and practice, at this time. For Medico-Legal purposes, it is also important to point out that variations in procedural techniques and pharmacological choices are the acceptable norm. The indications, contraindications, technique, and results of the above procedure should only be interpreted and judged by a Board-Certified Interventional Pain Specialist with extensive familiarity and expertise in the same exact procedure and technique, doing otherwise would be inappropriate and unethical.   ____________________________ Kathlen Brunswick. Dossie Arbour, MD fan:cbb D: 04/23/2011 16:23:00 ET T: 04/23/2011 16:45:50 ET JOB#: [id number]  cc: Adolphus Hanf A. Dossie Arbour, MD, <Dictator> Gaspar Cola MD ELECTRONICALLY SIGNED 04/30/2011 7:39

## 2014-08-12 NOTE — Op Note (Signed)
PATIENT NAME:  Charles [name], Charles [name] MR#:  [id number] DATE OF BIRTH:  [birth year]  DATE OF PROCEDURE:  10/09/2011  Location: Operating Room Referring Physician: Duard Brady, MD Procedure by: Kathlen Brunswick Dossie Arbour, M.D.  Note: This is the case of a 54 year old morbidly obese white male patient who comes into Same Day Surgery today for the permanent implant of a bilateral lumbar spinal cord stimulator.   Procedure(s):  1. Permanent implantation of Lumbar Epidural, Double Percutaneous Neurostimulator Leads (16 electrode array). 2. Implantation of Epidural Neurostimulator Generator. 3. Fluoroscopic Needle Guidance 4. Intraoperative Analysis and Programming. 5. Postoperative Analysis and Programming. 6. Moderate Conscious Sedation by [employer]est[employer] Team.  Surgeon: Kathlen Brunswick. Dossie Arbour, M.D. Side of implant: Bilateral Top electrode tip level: Right side at bottom of T7 and left side at the top of T8. Diagnostic Indications:  1. Chronic low back pain and bilateral lower extremity pain. 2. Chronic left knee pain. 3. Chronic left hip pain. 4. Lumbar spondylosis and lumbar arthrosis. Position: Prone.  Prepping solution: DuraPrep Area prepped: The thoracolumbar and sacral areas, were prepped with a broad-spectrum topical antiseptic microbicide. Target area: Lumbar Epidural Space, around the T8-9 vertebral body, for the electrode tip. Insertion site is the L2-3 intervertebral space. Level entered: T12-L1 Number of attempts: one  Infection Control: Standard Universal Precautions taken (Respiratory Hygiene/Cough Etiquette; Mouth, nose, eye protection; Hand Hygiene; Personal protective equipment (PPE); safe injection practices; and use of masks and disposable sterile surgical gloves) as recommended by the Department of [place] for Disease Control and Prevention (CDC).  Safety Measures: Allergies were reviewed. Appropriate site, procedure, and patient were confirmed by following the  Joint Commission's Universal Protocol (UP.01.01.01). The patient was asked to confirm marked site and procedure, before commencing. The patient was asked about blood thinners, or active infections, both of which were denied. No attempt was made at seeking any paresthesias. Aspiration looking for blood return was conducted prior to injecting. At no point did we inject any substances, as a needle was being advanced.  Pre-procedure Assessment:  A medical history and physical exam were obtained. Relevant documentation was reviewed and verified. Prior to the procedure, the patient was provided with an Audio CD, as well as written information on the procedure, including side-effects, and possible complications. Under the influence of no sedatives, a verbal, as well as a written informed consent were obtained, after having provided information on the risks and possible complications. To fulfill our ethical and legal obligations, as recommended by the American Medical Association's Code of Ethics, we have provided information to the patient about our clinical impression; the nature and purpose of an available treatment or procedure; the risks and benefits of an available treatment or procedure; alternatives; the risk and benefits of the alternative treatment or procedure; and the risks and benefits of not receiving or undergoing a treatment or procedure. The patient was provided information about the risks and possible complications associated with the procedure. These include, but not limited to, failure to achieve desired goals, infection, bleeding, organ or nerve damage, allergic reactions, paralysis, and death. In addition, the patient was informed that Medicine is not an exact science; therefore, there is also the possibility of unforeseen risks and possible complications that may result in a catastrophic outcome. The patient indicated having understood very clearly.  We have given the patient no guarantees and we  have made no promises. Ample time was given to the patient to ask questions, all of which were answered, to the patient's satisfaction, before  proceeding. The patient understands that by signing our informed consent form, they understand and accept the risks and the fact that it is impossible to predict all possible complications. Baseline vital signs were taken and the medical assessment was completed. Verification of the correct person, correct site (including marking of site), and correct procedure were performed and confirmed by the patient. Baseline vital signs were taken and the initial assessment was completed. Verification of the correct person, correct site (including marking of site), and correct procedure were performed and confirmed by the patient, in the form of a "Time Out".  Monitoring: The patient was monitored in the usual manner, using NIBPM, ECG, and pulse oximetry.  IV Access:  An IV access was obtained and secured.  Analgesia:  Moderate (Conscious) Intravenous sedation: Consent was obtained before administering any sedation. Availability of a responsible, adult driver, and NPO status confirmed. Meaningful verbal contact was maintained, with the patient at all times during the procedure. ASA Sedation Guidelines followed. For specifics on pharmacological type and quantity of sedation, please see nursing chart.  Prophylactic Antibiotics: Cefazolin 1 gm IVPB. Clindamycin 600 mg IVPB.  Local Anesthesia: Lidocaine 1%. The skin over the procedure site were infiltrated using a 3 ml Luer-Lok syringe with a 0.5 inch, 25-G needle. Deeper tissues were infiltrated using a 3.0 inch, 22-G spinal needle, under fluoroscopic guidance.  Fluoroscopy: The patient was taken to the operative suite, where the patient was placed in position for the procedure, over the fluoroscopy compatible table. Fluoroscopy was manipulated, using "Tunnel Vision Technique", to obtain the best possible view of the target  area, on the affected side. Parallax error was corrected before commencing the procedure. Gabor Racz's "Direction-Depth-Direction" technique was used to introduce the procedural needle under continuous pulsed fluoroscopic guidance. Once the target was reached, antero-posterior and lateral fluoroscopic views were taken to confirm needle placement in two planes. Fluoroscopy time: Please see the patient's chart for details.  Description of the procedure: The procedure site was prepped using a broad-spectrum topical antiseptic. The area was then draped in the usual and standard manner. "Time-out" was performed as per JC Universal Protocol (UP.01.01.01).   A midline incision was made over the spinous processes, and hemostasis attained. The procedural needle was then introduced through the incision using Gabor Racz's "Direction-Depth-Direction" technique, under pulsed fluoroscopic guidance. No attempt was made at seeking a paresthesia. The paramidline approach was used to enter the posterior lumbar epidural space at a 30 degree angle, using "Loss-of-resistance Technique" with 3 ml of PF-NaCl (0.9% NSS) + 0.5 ml of air, in a 5 ml glass syringe, using a "loss-of-bounce technique", at the desired level. Correct needle placement was confirmed in the  antero-posterior and lateral fluoroscopic views. The epidural lead was gently introduced under real-time fluoroscopy, constantly assessing for pain or paresthesias, until the tip was observed to be at the target level, on the side ipsilateral to the pain. The placement of the second lead was accomplished in a similar fashion. Once the target was thought to have been reached, antero-posterior and lateral fluoroscopic views were taken to confirm electrode placement in two planes. Placement was tested until a comfortable stimulation pattern was observed over the usual painful area. Once the patient had assured [place] that the stimulation was in the correct pattern, and distribution,  we proceeded to remove the 15-G "Tuohy" epidural needle(s). This was done while observing the electrode tip(s) under real-time fluoroscopy to prevent movement. The patient was sedated again, and 1% lidocaine was infiltrated into the  buttocks area, in order to create the generator pocket. The extension was tunneled and connected to the generator. An impedance check was conducted after connecting all sections of the system. Once hemostasis was confirmed, both wounds were closed with Vicryl 2-0 after cleaning them with a solution containing 50:50 hydrogen peroxide and Betadine. Surgical staples were used to close the skin. The wounds were covered with sterile transparent bio-occlusive dressings, to easily assess any evidence of infection in the future.  The patient tolerated the entire procedure well. A repeat set of vitals were taken after the procedure and the patient was kept under observation until discharge criteria was met. The patient was provided with discharge instructions, including a section on how to identify potential problems. Should any problems arise concerning this procedure, the patient was given instructions to immediately contact us, without hesitation. The neurostimulator representative and I, both provided the patient with our Business cards containing our contact telephone numbers, and instructed the patient to contact either one of [place], at any time, should there be any problems or questions. In any case, we plan to contact the patient by telephone for a follow-up status report regarding this interventional procedure.  EBL: 20 ml  Complications: No heme; no paresthesias.  Disposition: Return to clinics in 10-11 days for removal of staples and postoperative evaluation.  Additional Comments/Plan: None.  Equipment used:  1. Neurostimulator generatorBuyer, retail", Model [id number], Serial [id number]. 2. Medtronic charging system: Model [id number]. Serial [id number].   3. Medtronic patient programmer: Model [id number]. Serial [id number].  4. Medtronic pocket sizer: Model [id number]. Lot #[id number].  5. Medtronic accessory Kit: Model [id number]. Lot #[id number].  6. Medtronic accessory Kit: Model [id number]. Lot #[id number].  7. Medtronic "INJEX" bumpy anchor: Model [id number]. Lot #[id number]. 8. Medtronic lead kit: Model [phone]. Lot # [id number].  9. Medtronic lead kit: Model [id number].  Lot #[id number]. 10. Medtronic accessory kit: Model [id number]. Lot #[id number].  Disclaimer: Medicine is not an Chief Strategy Officer. The only guarantee in medicine is that nothing is guaranteed. It is important to note that the decision to proceed with this intervention was based on the information collected from the patient. The Data and conclusions were drawn from the patient's questionnaire, the interview, and the physical examination. Because the information was provided in large part by the patient, it cannot be guaranteed that it has not been purposely or unconsciously manipulated. Every effort has been made to obtain as much relevant data as possible for this evaluation. It is important to note that the conclusions that lead to this procedure are derived in large part from the available data. Always take into account that the treatment will also be dependent on availability of resources and existing treatment guidelines, considered by other Pain Management Practitioners as being common knowledge and practice, at this time. For Medico-Legal purposes, it is also important to point out that variations in procedural techniques and pharmacological choices are the acceptable norm. The indications, contraindications, technique, and results of the above procedure should only be interpreted and judged by a Board-Certified Interventional Pain Specialist with extensive familiarity and expertise in the same exact procedure and technique, doing otherwise would be inappropriate and  unethical. ____________________________ Kathlen Brunswick. Dossie Arbour, MD fan:slb D: 10/09/2011 14:14:00 ET T: 10/09/2011 14:33:37 ET JOB#: [id number]  cc: Juliane Guest A. Dossie Arbour, MD, <Dictator> Gaspar Cola MD ELECTRONICALLY SIGNED 10/09/2011 15:05

## 2014-09-14 ENCOUNTER — Emergency Department
Admission: EM | Admit: 2014-09-14 | Discharge: 2014-09-14 | Disposition: A | Discharge status: Home / Self Care | Payer: BLUE CROSS/BLUE SHIELD | Attending: Student | Admitting: Student

## 2014-09-14 ENCOUNTER — Emergency Department: Payer: BLUE CROSS/BLUE SHIELD

## 2014-09-14 ENCOUNTER — Encounter: Payer: Self-pay | Admitting: Emergency Medicine

## 2014-09-14 DIAGNOSIS — S8292XK Unspecified fracture of left lower leg, subsequent encounter for closed fracture with nonunion: Secondary | ICD-10-CM | POA: Diagnosis not present

## 2014-09-14 DIAGNOSIS — Z79899 Other long term (current) drug therapy: Secondary | ICD-10-CM | POA: Diagnosis not present

## 2014-09-14 DIAGNOSIS — R600 Localized edema: Secondary | ICD-10-CM

## 2014-09-14 DIAGNOSIS — L03116 Cellulitis of left lower limb: Secondary | ICD-10-CM

## 2014-09-14 DIAGNOSIS — G8929 Other chronic pain: Secondary | ICD-10-CM | POA: Diagnosis not present

## 2014-09-14 DIAGNOSIS — M549 Dorsalgia, unspecified: Secondary | ICD-10-CM | POA: Diagnosis not present

## 2014-09-14 DIAGNOSIS — R2232 Localized swelling, mass and lump, left upper limb: Secondary | ICD-10-CM | POA: Diagnosis present

## 2014-09-14 DIAGNOSIS — S82832K Other fracture of upper and lower end of left fibula, subsequent encounter for closed fracture with nonunion: Secondary | ICD-10-CM | POA: Diagnosis not present

## 2014-09-14 DIAGNOSIS — S82292K Other fracture of shaft of left tibia, subsequent encounter for closed fracture with nonunion: Secondary | ICD-10-CM

## 2014-09-14 LAB — COMPREHENSIVE METABOLIC PANEL
ALK PHOS: 86 U/L (ref 38–126)
ALT: 16 U/L — ABNORMAL LOW (ref 17–63)
ANION GAP: 9 (ref 5–15)
AST: 23 U/L (ref 15–41)
Albumin: 3.6 g/dL (ref 3.5–5.0)
BUN: 9 mg/dL (ref 6–20)
CHLORIDE: 107 mmol/L (ref 101–111)
CO2: 21 mmol/L — AB (ref 22–32)
CREATININE: 0.98 mg/dL (ref 0.61–1.24)
Calcium: 8.6 mg/dL — ABNORMAL LOW (ref 8.9–10.3)
GFR calc Af Amer: >60 mL/min (ref >60)
GLUCOSE: 106 mg/dL — AB (ref 65–99)
POTASSIUM: 4.1 mmol/L (ref 3.5–5.1)
SODIUM: 137 mmol/L (ref 135–145)
Total Bilirubin: 0.5 mg/dL (ref 0.3–1.2)
Total Protein: 6.5 g/dL (ref 6.5–8.1)

## 2014-09-14 LAB — CBC WITH DIFFERENTIAL/PLATELET
BASOS PCT: 1 %
Basophils Absolute: 0.1 10*3/uL (ref 0–0.1)
EOS PCT: 4 %
Eosinophils Absolute: 0.3 10*3/uL (ref 0–0.7)
HCT: 53.7 % — ABNORMAL HIGH (ref 40.0–52.0)
Hemoglobin: 17.8 g/dL (ref 13.0–18.0)
LYMPHS ABS: 2.5 10*3/uL (ref 1.0–3.6)
LYMPHS PCT: 36 %
MCH: 30.9 pg (ref 26.0–34.0)
MCHC: 33.2 g/dL (ref 32.0–36.0)
MCV: 93 fL (ref 80.0–100.0)
MONO ABS: 0.6 10*3/uL (ref 0.2–1.0)
MONOS PCT: 9 %
NEUTROS ABS: 3.5 10*3/uL (ref 1.4–6.5)
NEUTROS PCT: 50 %
Platelets: 212 10*3/uL (ref 150–440)
RBC: 5.77 MIL/uL (ref 4.40–5.90)
RDW: 15.3 % — ABNORMAL HIGH (ref 11.5–14.5)
WBC: 7 10*3/uL (ref 3.8–10.6)

## 2014-09-14 MED ORDER — CLINDAMYCIN PHOSPHATE 600 MG/50ML IV SOLN
600.0000 mg | Freq: Once | INTRAVENOUS | Status: AC
  Administered 2014-09-14: 600 mg via INTRAVENOUS

## 2014-09-14 MED ORDER — BACITRACIN-NEOMYCIN-POLYMYXIN 400-5-5000 EX OINT
TOPICAL_OINTMENT | CUTANEOUS | Status: AC
  Filled 2014-09-14: qty 1

## 2014-09-14 MED ORDER — CLINDAMYCIN PHOSPHATE 600 MG/50ML IV SOLN
INTRAVENOUS | Status: AC
  Administered 2014-09-14: 600 mg via INTRAVENOUS
  Filled 2014-09-14: qty 50

## 2014-09-14 MED ORDER — CLINDAMYCIN HCL 150 MG PO CAPS: 150.0000 mg | ORAL_CAPSULE | Freq: Four times a day (QID) | ORAL | Status: DC

## 2014-09-14 MED ORDER — BACITRACIN-NEOMYCIN-POLYMYXIN 400-5-5000 EX OINT
TOPICAL_OINTMENT | CUTANEOUS | Status: AC
  Filled 2014-09-14: qty 2

## 2014-09-14 MED ORDER — BACITRACIN-NEOMYCIN-POLYMYXIN OINTMENT TUBE
TOPICAL_OINTMENT | Freq: Once | CUTANEOUS | Status: AC
  Administered 2014-09-14: 18:00:00 via TOPICAL
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
## 2014-09-14 NOTE — Discharge Instructions (Signed)
Continue previous medications, start Cleocin, and follow up with Ortho Clinic.

## 2014-09-14 NOTE — ED Notes (Signed)
Pt informed to return if any life threatening symptoms occur.  
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
## 2014-10-20 DIAGNOSIS — F329 Major depressive disorder, single episode, unspecified: Secondary | ICD-10-CM | POA: Insufficient documentation

## 2014-11-26 DIAGNOSIS — Z79899 Other long term (current) drug therapy: Secondary | ICD-10-CM | POA: Insufficient documentation

## 2015-04-21 DIAGNOSIS — I219 Acute myocardial infarction, unspecified: Secondary | ICD-10-CM

## 2015-04-21 HISTORY — DX: Acute myocardial infarction, unspecified: I21.9

## 2015-06-25 ENCOUNTER — Observation Stay
Admission: EM | Admit: 2015-06-25 | Discharge: 2015-06-28 | Disposition: A | Discharge status: Home / Self Care | Payer: BLUE CROSS/BLUE SHIELD | Attending: Internal Medicine | Admitting: Internal Medicine

## 2015-06-25 ENCOUNTER — Encounter: Payer: Self-pay | Admitting: Internal Medicine

## 2015-06-25 ENCOUNTER — Emergency Department: Payer: BLUE CROSS/BLUE SHIELD

## 2015-06-25 DIAGNOSIS — Z79899 Other long term (current) drug therapy: Secondary | ICD-10-CM | POA: Diagnosis not present

## 2015-06-25 DIAGNOSIS — G8929 Other chronic pain: Secondary | ICD-10-CM | POA: Diagnosis not present

## 2015-06-25 DIAGNOSIS — F329 Major depressive disorder, single episode, unspecified: Secondary | ICD-10-CM | POA: Insufficient documentation

## 2015-06-25 DIAGNOSIS — Z981 Arthrodesis status: Secondary | ICD-10-CM | POA: Diagnosis not present

## 2015-06-25 DIAGNOSIS — M1990 Unspecified osteoarthritis, unspecified site: Secondary | ICD-10-CM | POA: Insufficient documentation

## 2015-06-25 DIAGNOSIS — Z888 Allergy status to other drugs, medicaments and biological substances status: Secondary | ICD-10-CM | POA: Diagnosis not present

## 2015-06-25 DIAGNOSIS — M549 Dorsalgia, unspecified: Secondary | ICD-10-CM | POA: Diagnosis not present

## 2015-06-25 DIAGNOSIS — Z7982 Long term (current) use of aspirin: Secondary | ICD-10-CM | POA: Diagnosis not present

## 2015-06-25 DIAGNOSIS — I2510 Atherosclerotic heart disease of native coronary artery without angina pectoris: Secondary | ICD-10-CM | POA: Insufficient documentation

## 2015-06-25 DIAGNOSIS — Z791 Long term (current) use of non-steroidal anti-inflammatories (NSAID): Secondary | ICD-10-CM | POA: Insufficient documentation

## 2015-06-25 DIAGNOSIS — K219 Gastro-esophageal reflux disease without esophagitis: Secondary | ICD-10-CM | POA: Insufficient documentation

## 2015-06-25 DIAGNOSIS — R079 Chest pain, unspecified: Secondary | ICD-10-CM | POA: Insufficient documentation

## 2015-06-25 DIAGNOSIS — E039 Hypothyroidism, unspecified: Secondary | ICD-10-CM | POA: Insufficient documentation

## 2015-06-25 DIAGNOSIS — F419 Anxiety disorder, unspecified: Secondary | ICD-10-CM | POA: Insufficient documentation

## 2015-06-25 DIAGNOSIS — R002 Palpitations: Secondary | ICD-10-CM | POA: Insufficient documentation

## 2015-06-25 DIAGNOSIS — Z6839 Body mass index (BMI) 39.0-39.9, adult: Secondary | ICD-10-CM | POA: Insufficient documentation

## 2015-06-25 DIAGNOSIS — E785 Hyperlipidemia, unspecified: Secondary | ICD-10-CM | POA: Insufficient documentation

## 2015-06-25 DIAGNOSIS — G4730 Sleep apnea, unspecified: Secondary | ICD-10-CM | POA: Insufficient documentation

## 2015-06-25 DIAGNOSIS — I259 Chronic ischemic heart disease, unspecified: Secondary | ICD-10-CM | POA: Diagnosis not present

## 2015-06-25 DIAGNOSIS — E669 Obesity, unspecified: Secondary | ICD-10-CM | POA: Diagnosis not present

## 2015-06-25 DIAGNOSIS — D509 Iron deficiency anemia, unspecified: Secondary | ICD-10-CM | POA: Diagnosis not present

## 2015-06-25 LAB — CBC
HCT: 48.2 % (ref 40.0–52.0)
HEMOGLOBIN: 16.4 g/dL (ref 13.0–18.0)
MCH: 31.8 pg (ref 26.0–34.0)
MCHC: 34 g/dL (ref 32.0–36.0)
MCV: 93.4 fL (ref 80.0–100.0)
Platelets: 245 10*3/uL (ref 150–440)
RBC: 5.16 MIL/uL (ref 4.40–5.90)
RDW: 15.9 % — ABNORMAL HIGH (ref 11.5–14.5)
WBC: 9 10*3/uL (ref 3.8–10.6)

## 2015-06-25 LAB — BASIC METABOLIC PANEL
ANION GAP: 9 (ref 5–15)
BUN: 10 mg/dL (ref 6–20)
CHLORIDE: 107 mmol/L (ref 101–111)
CO2: 26 mmol/L (ref 22–32)
Calcium: 9.1 mg/dL (ref 8.9–10.3)
Creatinine, Ser: 0.8 mg/dL (ref 0.61–1.24)
GFR calc Af Amer: >60 mL/min (ref >60)
GLUCOSE: 84 mg/dL (ref 65–99)
POTASSIUM: 4 mmol/L (ref 3.5–5.1)
Sodium: 142 mmol/L (ref 135–145)

## 2015-06-25 LAB — TROPONIN I: Troponin I: <0.03 ng/mL (ref <0.031)

## 2015-06-25 MED ORDER — NITROGLYCERIN 0.4 MG SL SUBL
0.4000 mg | SUBLINGUAL_TABLET | SUBLINGUAL | Status: DC | PRN
  Administered 2015-06-25 (×2): 0.4 mg via SUBLINGUAL
  Filled 2015-06-25 (×2): qty 1

## 2015-06-25 MED ORDER — PANTOPRAZOLE SODIUM 40 MG PO TBEC
40.0000 mg | DELAYED_RELEASE_TABLET | Freq: Every day | ORAL | Status: DC
  Administered 2015-06-26: 40 mg via ORAL
  Filled 2015-06-25: qty 1

## 2015-06-25 MED ORDER — ONDANSETRON 4 MG PO TBDP
4.0000 mg | ORAL_TABLET | Freq: Three times a day (TID) | ORAL | Status: DC | PRN
  Filled 2015-06-25: qty 1

## 2015-06-25 MED ORDER — ALPRAZOLAM 1 MG PO TABS
1.0000 mg | ORAL_TABLET | Freq: Three times a day (TID) | ORAL | Status: DC
  Administered 2015-06-25 – 2015-06-27 (×6): 1 mg via ORAL
  Filled 2015-06-25 (×6): qty 1

## 2015-06-25 MED ORDER — ALBUTEROL SULFATE (2.5 MG/3ML) 0.083% IN NEBU: 2.5000 mg | INHALATION_SOLUTION | Freq: Four times a day (QID) | RESPIRATORY_TRACT | Status: DC | PRN

## 2015-06-25 MED ORDER — MORPHINE SULFATE ER 15 MG PO TBCR
15.0000 mg | EXTENDED_RELEASE_TABLET | Freq: Every day | ORAL | Status: DC
  Administered 2015-06-26: 15 mg via ORAL
  Filled 2015-06-25: qty 1

## 2015-06-25 MED ORDER — BUSPIRONE HCL 5 MG PO TABS
30.0000 mg | ORAL_TABLET | Freq: Two times a day (BID) | ORAL | Status: DC
  Administered 2015-06-25 – 2015-06-27 (×4): 30 mg via ORAL
  Filled 2015-06-25 (×4): qty 6

## 2015-06-25 MED ORDER — ADULT MULTIVITAMIN W/MINERALS CH
1.0000 | ORAL_TABLET | Freq: Every day | ORAL | Status: DC
  Administered 2015-06-26: 1 via ORAL
  Filled 2015-06-25: qty 1

## 2015-06-25 MED ORDER — NITROGLYCERIN 2 % TD OINT
1.0000 [in_us] | TOPICAL_OINTMENT | Freq: Once | TRANSDERMAL | Status: AC
  Administered 2015-06-25: 1 [in_us] via TOPICAL
  Filled 2015-06-25: qty 1

## 2015-06-25 MED ORDER — ZOLPIDEM TARTRATE 5 MG PO TABS
10.0000 mg | ORAL_TABLET | Freq: Every day | ORAL | Status: DC
  Administered 2015-06-25 – 2015-06-27 (×3): 10 mg via ORAL
  Filled 2015-06-25 (×3): qty 2

## 2015-06-25 MED ORDER — ASPIRIN EC 325 MG PO TBEC
325.0000 mg | DELAYED_RELEASE_TABLET | Freq: Every day | ORAL | Status: DC
Start: 2015-06-25 — End: 2015-06-26
  Administered 2015-06-25 – 2015-06-26 (×2): 325 mg via ORAL
  Filled 2015-06-25 (×2): qty 1

## 2015-06-25 MED ORDER — OXYCODONE HCL 5 MG PO TABS
10.0000 mg | ORAL_TABLET | Freq: Every day | ORAL | Status: DC
  Administered 2015-06-26 – 2015-06-27 (×6): 10 mg via ORAL
  Filled 2015-06-25 (×6): qty 2

## 2015-06-25 MED ORDER — RISAQUAD PO CAPS
1.0000 | ORAL_CAPSULE | Freq: Every day | ORAL | Status: DC
  Administered 2015-06-25 – 2015-06-26 (×2): 1 via ORAL
  Filled 2015-06-25 (×2): qty 1

## 2015-06-25 MED ORDER — GABAPENTIN 300 MG PO CAPS
600.0000 mg | ORAL_CAPSULE | Freq: Every day | ORAL | Status: DC
  Administered 2015-06-25 – 2015-06-28 (×10): 600 mg via ORAL
  Filled 2015-06-25 (×10): qty 2

## 2015-06-25 MED ORDER — MORPHINE SULFATE ER 15 MG PO TBCR
30.0000 mg | EXTENDED_RELEASE_TABLET | Freq: Every morning | ORAL | Status: DC
  Administered 2015-06-26: 30 mg via ORAL
  Filled 2015-06-25: qty 2

## 2015-06-25 MED ORDER — LEVOTHYROXINE SODIUM 25 MCG PO TABS
75.0000 ug | ORAL_TABLET | Freq: Every day | ORAL | Status: DC
  Administered 2015-06-26: 75 ug via ORAL
  Filled 2015-06-25: qty 1

## 2015-06-25 MED ORDER — HEPARIN SODIUM (PORCINE) 5000 UNIT/ML IJ SOLN
5000.0000 [IU] | Freq: Three times a day (TID) | INTRAMUSCULAR | Status: DC
  Administered 2015-06-25 – 2015-06-26 (×2): 5000 [IU] via SUBCUTANEOUS
  Filled 2015-06-25 (×2): qty 1

## 2015-06-25 MED ORDER — DULOXETINE HCL 30 MG PO CPEP
60.0000 mg | ORAL_CAPSULE | Freq: Every day | ORAL | Status: DC
  Administered 2015-06-26: 60 mg via ORAL
  Filled 2015-06-25: qty 2

## 2015-06-25 MED ORDER — NITROGLYCERIN 0.4 MG SL SUBL
0.4000 mg | SUBLINGUAL_TABLET | SUBLINGUAL | Status: DC | PRN
  Administered 2015-06-26: 0.4 mg via SUBLINGUAL

## 2015-06-25 NOTE — ED Provider Notes (Signed)
Time Seen: Approximately 1737  I have reviewed the triage notes  Chief Complaint: Chest Pain   History of Present Illness: Charles [name] is a 55 y.o. male who presents with some left-sided chest pressure that spent occurring now for the last 20 minutes. Patient states he had approximately a 2-5 minute episode earlier today at noon. At that time he also had some right arm and shoulder discomfort. He states he was watching TV tonight when the pain started he has had some mild shortness of breath. He denies any pleuritic component to his pain he denies any radiation currently. Patient states some nausea with no persistent vomiting and no diaphoresis. The patient denies any fever or chills or productive cough. He denies any current risk factors for DVT or pulmonary embolism. He states he took 2 aspirin earlier today. He has history of anxiety with chronic pain issues with spinal fusion in his back and a spinal cord stimulator for chronic pain. Patient denies any left arm or jaw discomfort. Past Medical History  Diagnosis Date  . Shortness of breath   . Sleep apnea     has cpap-has not used since lost 140lb  . GERD (gastroesophageal reflux disease)   . Peptic ulcer   . Chronic back pain   . Spinal cord stimulator status     has a scs  . Depression   . Anxiety   . Arthritis   . Wears glasses   . Hypothyroidism   . Insomnia   . Low testosterone     There are no active problems to display for this patient.   Past Surgical History  Procedure Laterality Date  . Gastric bypass  2011    has lost 140lb  . Spinal cord stimulator implant  6/13  . Repair tendons foot  2002    rt foot  . Facial fracture surgery      face-upper jaw with dental implants  . Closed reduction nasal fracture  12/22/2011    Procedure: CLOSED REDUCTION NASAL FRACTURE;  Surgeon: Ascencion Dike, MD;  Location: [facility];  Service: ENT;  Laterality: N/A;  closed reduction of nasal fracture    Past  Surgical History  Procedure Laterality Date  . Gastric bypass  2011    has lost 140lb  . Spinal cord stimulator implant  6/13  . Repair tendons foot  2002    rt foot  . Facial fracture surgery      face-upper jaw with dental implants  . Closed reduction nasal fracture  12/22/2011    Procedure: CLOSED REDUCTION NASAL FRACTURE;  Surgeon: Ascencion Dike, MD;  Location: [facility];  Service: ENT;  Laterality: N/A;  closed reduction of nasal fracture    Current Outpatient Rx  Name  Route  Sig  Dispense  Refill  . albuterol (PROVENTIL HFA;VENTOLIN HFA) 108 (90 BASE) MCG/ACT inhaler   Inhalation   Inhale 2 puffs into the lungs every 6 (six) hours as needed.         . ALPRAZolam (XANAX) 1 MG tablet   Oral   Take 1 mg by mouth 3 (three) times daily as needed.         . busPIRone (BUSPAR) 15 MG tablet   Oral   Take 15 mg by mouth 3 (three) times daily.         . citalopram (CELEXA) 40 MG tablet   Oral   Take 40 mg by mouth daily.         Marland Kitchen  clindamycin (CLEOCIN) 150 MG capsule   Oral   Take 1 capsule (150 mg total) by mouth 4 (four) times daily.   40 capsule   0   . DULoxetine (CYMBALTA) 20 MG capsule   Oral   Take 20 mg by mouth daily.         . fentaNYL (DURAGESIC - DOSED MCG/HR) 50 MCG/HR   Transdermal   Place 1 patch onto the skin every 3 (three) days.         Marland Kitchen gabapentin (NEURONTIN) 800 MG tablet   Oral   Take 800 mg by mouth 4 (four) times daily.         Marland Kitchen HYDROcodone-acetaminophen (NORCO/VICODIN) 5-325 MG per tablet   Oral   Take 1 tablet by mouth every 6 (six) hours as needed.         Marland Kitchen levothyroxine (SYNTHROID, LEVOTHROID) 75 MCG tablet   Oral   Take 75 mcg by mouth daily.         Marland Kitchen omeprazole (PRILOSEC) 20 MG capsule   Oral   Take 20 mg by mouth 2 (two) times daily.         . sucralfate (CARAFATE) 1 G tablet   Oral   Take 1 g by mouth 4 (four) times daily.         Marland Kitchen testosterone cypionate (DEPOTESTOTERONE CYPIONATE) 100  MG/ML injection   Intramuscular   Inject into the muscle every 14 (fourteen) days. For IM use only         . zolpidem (AMBIEN CR) 12.5 MG CR tablet   Oral   Take 12.5 mg by mouth at bedtime as needed.           Allergies:  Lisinopril  Family History: No family history on file. Patient states his parents had heart disease still much later in life. He has a younger sister that had a stroke but seemed to be more related to unusual causes . Social History: Social History  Substance Use Topics  . Smoking status: Never Smoker   . Smokeless tobacco: Not on file  . Alcohol Use: No     Comment: not in 4 yr     Review of Systems:   10 point review of systems was performed and was otherwise negative:  Constitutional: No fever Eyes: No visual disturbances ENT: No sore throat, ear pain Cardiac: Pain is described as a pressure pain Respiratory: No shortness of breath, wheezing, or stridor Abdomen: No abdominal pain, no vomiting, No diarrhea Endocrine: No weight loss, No night sweats Extremities: No peripheral edema, cyanosis Skin: No rashes, easy bruising Neurologic: No focal weakness, trouble with speech or swollowing Urologic: No dysuria, Hematuria, or urinary frequency   Physical Exam:  ED Triage Vitals  Enc Vitals Group     BP 06/25/15 1625 155/101 mmHg     Pulse Rate 06/25/15 1625 77     Resp 06/25/15 1625 16     Temp 06/25/15 1625 97.9 F (36.6 C)     Temp Source 06/25/15 1625 Oral     SpO2 06/25/15 1625 94 %     Weight 06/25/15 1625 300 lb (136.079 kg)     Height 06/25/15 1625 6\' 2"  (1.88 m)     Head Cir --      Peak Flow --      Pain Score 06/25/15 1626 5     Pain Loc --      Pain Edu? --      Excl. in [place]? --  General: Awake , Alert , and Oriented times 3; GCS 15 Head: Normal cephalic , atraumatic Eyes: Pupils equal , round, reactive to light Nose/Throat: No nasal drainage, patent upper airway without erythema or exudate.  Neck: Supple, Full range  of motion, No anterior adenopathy or palpable thyroid masses Lungs: Clear to ascultation without wheezes , rhonchi, or rales Heart: Regular rate, regular rhythm without murmurs , gallops , or rubs Abdomen: Soft, non tender without rebound, guarding , or rigidity; bowel sounds positive and symmetric in all 4 quadrants. No organomegaly .        Extremities: 2 plus symmetric pulses. No edema, clubbing or cyanosis Neurologic: normal ambulation, Motor symmetric without deficits, sensory intact Skin: warm, dry, no rashes No reproducible chest wall pain  Labs:   All laboratory work was reviewed including any pertinent negatives or positives listed below:  Labs Reviewed  CBC - Abnormal; Notable for the following:    RDW 15.9 (*)    All other components within normal limits  BASIC METABOLIC PANEL  TROPONIN I   reviewed the patient's laboratory work shows no significant findings  EKG: * ED ECG REPORT I, Daymon Larsen, the attending physician, personally viewed and interpreted this ECG.  Date: 06/25/2015 EKG Time: 1624 Rate: 81 Rhythm: normal sinus rhythm occasional PVCs QRS Axis: normal Intervals: normal ST/T Wave abnormalities: normal Conduction Disturbances: none Narrative Interpretation: unremarkable No acute ischemic changes  Radiology:   Final result by Rad Results In Interface (06/25/15 17:03:52)   Narrative:   CLINICAL DATA: Left-sided chest pain, twisting sensation  EXAM: CHEST 2 VIEW  COMPARISON: 10/26/2010  FINDINGS: There is no focal parenchymal opacity. There is no pleural effusion or pneumothorax. The heart and mediastinal contours are unremarkable.  There is posterior spinal fusion hardware present.  IMPRESSION: No active cardiopulmonary disease.   Electronically Signed By: Kathreen Devoid On: 06/25/2015 17:03           I personally reviewed the radiologic studies    ED Course: * Differential includes all life-threatening causes for  chest pain. This includes but is not exclusive to acute coronary syndrome, aortic dissection, pulmonary embolism, cardiac tamponade, community-acquired pneumonia, pericarditis, musculoskeletal chest wall pain, etc. The patient discomfort I felt narrowed down to a differential of either unstable angina or acute coronary syndrome versus possibly esophageal reflux disease. Patient did have some relief with nitroglycerin and has not had any cardiovascular studies of significance in the past. Patient had an inch of nitroglycerin paste applied and had aspirin earlier in the day. Initial EKG and troponins are negative.    Assessment:  Acute unspecified chest pain     Plan: * Inpatient management            Daymon Larsen, MD 06/25/15 1820

## 2015-06-25 NOTE — ED Notes (Addendum)
Pt reports left sided chest pain, twisting sensation since noon today. Reports nausea and shortness of breath with pain. Reports pain radiates into right shoulder and right hand. Pt reports neurostimulator in back, takes chronic pain medication for back, shoulder and leg.
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
## 2015-06-26 ENCOUNTER — Observation Stay
Admit: 2015-06-26 | Discharge: 2015-06-26 | Disposition: A | Discharge status: Home / Self Care | Payer: BLUE CROSS/BLUE SHIELD | Attending: Internal Medicine | Admitting: Internal Medicine

## 2015-06-26 LAB — CBC
HEMATOCRIT: 40.4 % (ref 40.0–52.0)
HEMATOCRIT: 41.9 % (ref 40.0–52.0)
HEMOGLOBIN: 14.4 g/dL (ref 13.0–18.0)
Hemoglobin: 14.1 g/dL (ref 13.0–18.0)
MCH: 32.5 pg (ref 26.0–34.0)
MCH: 32.6 pg (ref 26.0–34.0)
MCHC: 34.9 g/dL (ref 32.0–36.0)
MCHC: 34.4 g/dL (ref 32.0–36.0)
MCV: 93.1 fL (ref 80.0–100.0)
MCV: 94.7 fL (ref 80.0–100.0)
PLATELETS: 202 10*3/uL (ref 150–440)
Platelets: 206 10*3/uL (ref 150–440)
RBC: 4.34 MIL/uL — ABNORMAL LOW (ref 4.40–5.90)
RBC: 4.42 MIL/uL (ref 4.40–5.90)
RDW: 15.6 % — AB (ref 11.5–14.5)
RDW: 15.4 % — AB (ref 11.5–14.5)
WBC: 8 10*3/uL (ref 3.8–10.6)
WBC: 6.9 10*3/uL (ref 3.8–10.6)

## 2015-06-26 LAB — HEMOGLOBIN A1C: HEMOGLOBIN A1C: 5.4 % (ref 4.0–6.0)

## 2015-06-26 LAB — LIPID PANEL
CHOLESTEROL: 193 mg/dL (ref 0–200)
HDL: 25 mg/dL — ABNORMAL LOW (ref >40)
LDL Cholesterol: 108 mg/dL — ABNORMAL HIGH (ref 0–99)
Total CHOL/HDL Ratio: 7.7 RATIO
Triglycerides: 301 mg/dL — ABNORMAL HIGH (ref <150)
VLDL: 60 mg/dL — ABNORMAL HIGH (ref 0–40)

## 2015-06-26 LAB — BASIC METABOLIC PANEL
ANION GAP: 4 — AB (ref 5–15)
BUN: 11 mg/dL (ref 6–20)
CO2: 31 mmol/L (ref 22–32)
Calcium: 8.5 mg/dL — ABNORMAL LOW (ref 8.9–10.3)
Chloride: 108 mmol/L (ref 101–111)
Creatinine, Ser: 0.94 mg/dL (ref 0.61–1.24)
GFR calc Af Amer: >60 mL/min (ref >60)
Glucose, Bld: 94 mg/dL (ref 65–99)
POTASSIUM: 4.5 mmol/L (ref 3.5–5.1)
SODIUM: 143 mmol/L (ref 135–145)

## 2015-06-26 LAB — TROPONIN I: Troponin I: <0.03 ng/mL (ref <0.031)

## 2015-06-26 LAB — CREATININE, SERUM
Creatinine, Ser: 0.89 mg/dL (ref 0.61–1.24)
GFR calc Af Amer: >60 mL/min (ref >60)

## 2015-06-26 LAB — HEPARIN LEVEL (UNFRACTIONATED): Heparin Unfractionated: 0.18 IU/mL — ABNORMAL LOW (ref 0.30–0.70)

## 2015-06-26 LAB — PROTIME-INR
INR: 1.14
PROTHROMBIN TIME: 14.8 s (ref 11.4–15.0)

## 2015-06-26 LAB — APTT: APTT: 32 s (ref 24–36)

## 2015-06-26 MED ORDER — PERFLUTREN LIPID MICROSPHERE
INTRAVENOUS | Status: AC
  Filled 2015-06-26: qty 10

## 2015-06-26 MED ORDER — HEPARIN (PORCINE) IN NACL 100-0.45 UNIT/ML-% IJ SOLN
2100.0000 [IU]/h | INTRAMUSCULAR | Status: DC
  Administered 2015-06-26: 1450 [IU]/h via INTRAVENOUS
  Administered 2015-06-27: 1850 [IU]/h via INTRAVENOUS
  Filled 2015-06-26 (×4): qty 250

## 2015-06-26 MED ORDER — SODIUM CHLORIDE 0.9 % WEIGHT BASED INFUSION: 1.0000 mL/kg/h | INTRAVENOUS | Status: DC

## 2015-06-26 MED ORDER — HEPARIN BOLUS VIA INFUSION
4000.0000 [IU] | Freq: Once | INTRAVENOUS | Status: AC
  Administered 2015-06-26: 4000 [IU] via INTRAVENOUS
  Filled 2015-06-26: qty 4000

## 2015-06-26 MED ORDER — TICAGRELOR 90 MG PO TABS
180.0000 mg | ORAL_TABLET | Freq: Once | ORAL | Status: AC
  Administered 2015-06-26: 180 mg via ORAL
  Filled 2015-06-26: qty 2

## 2015-06-26 MED ORDER — HEPARIN BOLUS VIA INFUSION
3400.0000 [IU] | Freq: Once | INTRAVENOUS | Status: AC
  Administered 2015-06-26: 3400 [IU] via INTRAVENOUS
  Filled 2015-06-26: qty 3400

## 2015-06-26 MED ORDER — SODIUM CHLORIDE 0.9% FLUSH: 3.0000 mL | INTRAVENOUS | Status: DC | PRN

## 2015-06-26 MED ORDER — SODIUM CHLORIDE 0.9% FLUSH: 3.0000 mL | Freq: Two times a day (BID) | INTRAVENOUS | Status: DC

## 2015-06-26 MED ORDER — SODIUM CHLORIDE FLUSH 0.9 % IV SOLN
INTRAVENOUS | Status: AC
  Filled 2015-06-26: qty 30

## 2015-06-26 MED ORDER — ASPIRIN 81 MG PO CHEW
81.0000 mg | CHEWABLE_TABLET | ORAL | Status: AC
  Administered 2015-06-27: 81 mg via ORAL
  Filled 2015-06-26: qty 1

## 2015-06-26 MED ORDER — SIMETHICONE 80 MG PO CHEW
160.0000 mg | CHEWABLE_TABLET | Freq: Three times a day (TID) | ORAL | Status: DC | PRN
  Administered 2015-06-26: 160 mg via ORAL
  Filled 2015-06-26 (×2): qty 2

## 2015-06-26 MED ORDER — SODIUM CHLORIDE 0.9 % WEIGHT BASED INFUSION: 3.0000 mL/kg/h | INTRAVENOUS | Status: AC

## 2015-06-26 MED ORDER — METOPROLOL TARTRATE 25 MG PO TABS
25.0000 mg | ORAL_TABLET | Freq: Two times a day (BID) | ORAL | Status: DC
  Administered 2015-06-26 – 2015-06-27 (×3): 25 mg via ORAL
  Filled 2015-06-26 (×3): qty 1

## 2015-06-26 MED ORDER — TICAGRELOR 90 MG PO TABS
90.0000 mg | ORAL_TABLET | Freq: Two times a day (BID) | ORAL | Status: DC
  Administered 2015-06-26 – 2015-06-27 (×2): 90 mg via ORAL
  Filled 2015-06-26 (×2): qty 1

## 2015-06-26 MED ORDER — ASPIRIN 81 MG PO CHEW
81.0000 mg | CHEWABLE_TABLET | Freq: Every day | ORAL | Status: DC
Start: 2015-06-27 — End: 2015-06-28

## 2015-06-26 MED ORDER — SODIUM CHLORIDE 0.9 % IV SOLN: 250.0000 mL | INTRAVENOUS | Status: DC | PRN

## 2015-06-26 NOTE — Consult Note (Addendum)
ANTICOAGULATION CONSULT NOTE - Initial Consult  Pharmacy Consult for heparin drip Indication: chest pain/ACS  Allergies  Allergen Reactions  . Lisinopril Cough    Patient Measurements: Height: 6\' 2"  (188 cm) Weight: (!) 306 lb 7 oz (139 kg) IBW/kg (Calculated) : 82.2 Heparin Dosing Weight: 113kg  Vital Signs: Temp: 98 F (36.7 C) (03/08 1128) Temp Source: Oral (03/08 1128) BP: 124/61 mmHg (03/08 1128) Pulse Rate: 81 (03/08 1128)  Labs:  Recent Labs  06/25/15 1629 06/25/15 2014 06/26/15 0148 06/26/15 0506  HGB 16.4  --  14.1 14.4  HCT 48.2  --  40.4 41.9  PLT 245  --  202 206  CREATININE 0.80  --  0.89 0.94  TROPONINI <0.03 <0.03 <0.03 <0.03    Estimated Creatinine Clearance: 131.7 mL/min (by C-G formula based on Cr of 0.94).   Medical History: Past Medical History  Diagnosis Date  . Shortness of breath   . Sleep apnea     has cpap-has not used since lost 140lb  . GERD (gastroesophageal reflux disease)   . Peptic ulcer   . Chronic back pain   . Spinal cord stimulator status     has a scs  . Depression   . Anxiety   . Arthritis   . Wears glasses   . Hypothyroidism   . Insomnia   . Low testosterone     Medications:  Scheduled:  . acidophilus  1 capsule Oral Daily  . ALPRAZolam  1 mg Oral TID  . [START ON 06/27/2015] aspirin  81 mg Oral Daily  . busPIRone  30 mg Oral BID  . DULoxetine  60 mg Oral Daily  . gabapentin  600 mg Oral 6 X Daily  . heparin  4,000 Units Intravenous Once  . levothyroxine  75 mcg Oral QAC breakfast  . metoprolol tartrate  25 mg Oral BID  . morphine  15 mg Oral QHS  . morphine  30 mg Oral q morning - 10a  . multivitamin with minerals  1 tablet Oral Daily  . oxyCODONE  10 mg Oral 6 X Daily  . pantoprazole  40 mg Oral Daily  . perflutren lipid microspheres (DEFINITY) IV suspension      . sodium chloride flush      . ticagrelor  180 mg Oral Once  . ticagrelor  90 mg Oral BID  . zolpidem  10 mg Oral QHS     Assessment: Pt is a 55 year old male who presents with chest pain. Cardiology consulted pharmacy to start heparin drip. Pt has been on s/c heparin, however, last dose about 8 hours ago. Med rec dose not show any home anticoagulants. Add on INR and APTT ordered  Goal of Therapy:  Heparin level 0.3-0.7 units/ml Monitor platelets by anticoagulation protocol: Yes   Plan:  Give 4000 units bolus x 1 Start heparin infusion at 1450 units/hr Check anti-Xa level in 6 hours and daily while on heparin Continue to monitor H&H and platelets  Dequan Kindred D Kenyatte Chatmon 06/26/2015,12:53 PM
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
## 2015-06-26 NOTE — Progress Notes (Addendum)
ANTICOAGULATION CONSULT NOTE - Follow Up Consult  Pharmacy Consult for ACS/NSTEMI  Indication: chest pain/ACS  Allergies  Allergen Reactions  . Lisinopril Cough    Patient Measurements: Height: 6\' 2"  (188 cm) Weight: (!) 306 lb 7 oz (139 kg) IBW/kg (Calculated) : 82.2 Heparin Dosing Weight: 113 kg    Vital Signs: Temp: 98.5 F (36.9 C) (03/08 1945) Temp Source: Oral (03/08 1128) BP: 148/74 mmHg (03/08 1945) Pulse Rate: 76 (03/08 1945)  Labs:  Recent Labs  06/25/15 1629 06/25/15 2014 06/26/15 0148 06/26/15 0506 06/26/15 1317 06/26/15 2053  HGB 16.4  --  14.1 14.4  --   --   HCT 48.2  --  40.4 41.9  --   --   PLT 245  --  202 206  --   --   APTT  --   --   --   --  32  --   LABPROT  --   --   --   --  14.8  --   INR  --   --   --   --  1.14  --   HEPARINUNFRC  --   --   --   --   --  0.18*  CREATININE 0.80  --  0.89 0.94  --   --   TROPONINI <0.03 <0.03 <0.03 <0.03  --   --     Estimated Creatinine Clearance: 131.7 mL/min (by C-G formula based on Cr of 0.94).   Medications:  Prescriptions prior to admission  Medication Sig Dispense Refill Last Dose  . acidophilus (RISAQUAD) CAPS capsule Take 1 capsule by mouth daily.   06/25/2015 at Unknown time  . albuterol (PROVENTIL HFA;VENTOLIN HFA) 108 (90 BASE) MCG/ACT inhaler Inhale 2 puffs into the lungs every 6 (six) hours as needed for wheezing or shortness of breath.    Past Month at Unknown time  . ALPRAZolam (XANAX) 1 MG tablet Take 1 mg by mouth 3 (three) times daily.    06/25/2015 at Unknown time  . busPIRone (BUSPAR) 15 MG tablet Take 30 mg by mouth 2 (two) times daily.    06/25/2015 at Unknown time  . diclofenac sodium (VOLTAREN) 1 % GEL Apply 2 g topically 4 (four) times daily as needed (for pain).   06/25/2015 at Unknown time  . DULoxetine (CYMBALTA) 60 MG capsule Take 60 mg by mouth daily.   06/25/2015 at Unknown time  . gabapentin (NEURONTIN) 300 MG capsule Take 600 mg by mouth 6 (six) times daily.   06/25/2015 at  Unknown time  . levothyroxine (SYNTHROID, LEVOTHROID) 75 MCG tablet Take 75 mcg by mouth daily before breakfast.    06/25/2015 at Unknown time  . lidocaine (LIDODERM) 5 % Place 1 patch onto the skin daily. Remove & Discard patch within 12 hours or as directed by MD   06/25/2015 at 0530  . morphine (MS CONTIN) 15 MG 12 hr tablet Take 15-30 mg by mouth every 12 (twelve) hours. Pt takes two tablets in the morning and one at night.   06/25/2015 at 0530  . Multiple Vitamin (MULTIVITAMIN WITH MINERALS) TABS tablet Take 1 tablet by mouth daily.   06/25/2015 at Unknown time  . omeprazole (PRILOSEC) 20 MG capsule Take 20 mg by mouth daily.    06/25/2015 at Unknown time  . ondansetron (ZOFRAN-ODT) 4 MG disintegrating tablet Take 4 mg by mouth every 8 (eight) hours as needed for nausea or vomiting.   06/25/2015 at Unknown time  . Oxycodone HCl 10  MG TABS Take 10 mg by mouth 6 (six) times daily.   06/25/2015 at 1400  . testosterone cypionate (DEPOTESTOSTERONE CYPIONATE) 200 MG/ML injection Inject 150 mg into the muscle every 14 (fourteen) days.   06/21/2015 at unknown   . zolpidem (AMBIEN) 10 MG tablet Take 10 mg by mouth at bedtime.   06/24/2015 at Unknown time    Assessment: CrCl = 131.7 ml/min   Goal of Therapy:  Heparin level 0.3-0.7 units/ml Monitor platelets by anticoagulation protocol: Yes   Plan:  3/08:  HL @ 21:00 = 0.18           Heparin 3400 units IV X 1 bolus and increase drip rate to 1850 units/hr.             Will recheck HL 6 hrs after rate change on 3/9 @ 4:00.   Allia Wiltsey D 06/26/2015,9:50 PM

## 2015-06-26 NOTE — Progress Notes (Signed)
Pt requesting phosphate to "clean him out". MD Dr. Tressia Miners notified. Orders placed for mylicon prn. RN will continue to monitor. Rachael Fee, RN

## 2015-06-26 NOTE — Progress Notes (Signed)
Patient alert and oriented, VSS. Has had some chest pain x1 today.  Nitroglycerin given and pain relieved.  Heparin gtt started and bolus given.  On RA.  NSR on monitor.  Cardiac cath planned for tomorrow at 0730, consent signed.

## 2015-06-26 NOTE — Progress Notes (Signed)
*  PRELIMINARY RESULTS* Echocardiogram 2D Echocardiogram has been performed.  Laqueta Jean Hege 06/26/2015, 8:38 AM

## 2015-06-27 ENCOUNTER — Encounter
Admission: EM | Disposition: A | Discharge status: Home / Self Care | Payer: Self-pay | Source: Home / Self Care | Attending: Emergency Medicine

## 2015-06-27 DIAGNOSIS — I2510 Atherosclerotic heart disease of native coronary artery without angina pectoris: Secondary | ICD-10-CM

## 2015-06-27 HISTORY — PX: CARDIAC CATHETERIZATION: SHX172

## 2015-06-27 HISTORY — DX: Atherosclerotic heart disease of native coronary artery without angina pectoris: I25.10

## 2015-06-27 LAB — CBC
HEMATOCRIT: 46.5 % (ref 40.0–52.0)
HEMOGLOBIN: 15.7 g/dL (ref 13.0–18.0)
MCH: 31.9 pg (ref 26.0–34.0)
MCHC: 33.7 g/dL (ref 32.0–36.0)
MCV: 94.5 fL (ref 80.0–100.0)
Platelets: 226 10*3/uL (ref 150–440)
RBC: 4.92 MIL/uL (ref 4.40–5.90)
RDW: 15.7 % — ABNORMAL HIGH (ref 11.5–14.5)
WBC: 6.6 10*3/uL (ref 3.8–10.6)

## 2015-06-27 LAB — HEPARIN LEVEL (UNFRACTIONATED): Heparin Unfractionated: 0.24 IU/mL — ABNORMAL LOW (ref 0.30–0.70)

## 2015-06-27 LAB — TROPONIN I

## 2015-06-27 LAB — CKMB (ARMC ONLY): CK, MB: 1.7 ng/mL (ref 0.5–5.0)

## 2015-06-27 SURGERY — LEFT HEART CATH AND CORONARY ANGIOGRAPHY
Anesthesia: Moderate Sedation

## 2015-06-27 MED ORDER — SODIUM CHLORIDE 0.9 % IV SOLN: 250.0000 mL | INTRAVENOUS | Status: DC | PRN

## 2015-06-27 MED ORDER — SODIUM CHLORIDE 0.9% FLUSH
3.0000 mL | Freq: Two times a day (BID) | INTRAVENOUS | Status: DC
  Administered 2015-06-27: 3 mL via INTRAVENOUS

## 2015-06-27 MED ORDER — ONDANSETRON HCL 4 MG/2ML IJ SOLN: 4.0000 mg | Freq: Four times a day (QID) | INTRAMUSCULAR | Status: DC | PRN

## 2015-06-27 MED ORDER — MORPHINE SULFATE (PF) 4 MG/ML IV SOLN
INTRAVENOUS | Status: DC | PRN
  Administered 2015-06-27 (×2): 4 mg via INTRAVENOUS

## 2015-06-27 MED ORDER — BIVALIRUDIN BOLUS VIA INFUSION - CUPID
INTRAVENOUS | Status: DC | PRN
  Administered 2015-06-27: 104.25 mg via INTRAVENOUS

## 2015-06-27 MED ORDER — TICAGRELOR 90 MG PO TABS: 90.0000 mg | ORAL_TABLET | Freq: Two times a day (BID) | ORAL | Status: DC

## 2015-06-27 MED ORDER — HYDROMORPHONE HCL 1 MG/ML IJ SOLN
INTRAMUSCULAR | Status: AC
  Filled 2015-06-27: qty 1

## 2015-06-27 MED ORDER — SODIUM CHLORIDE 0.9% FLUSH: 3.0000 mL | Freq: Two times a day (BID) | INTRAVENOUS | Status: DC

## 2015-06-27 MED ORDER — BIVALIRUDIN 250 MG IV SOLR
INTRAVENOUS | Status: AC
  Filled 2015-06-27: qty 250

## 2015-06-27 MED ORDER — HYDROMORPHONE HCL 1 MG/ML IJ SOLN
1.0000 mg | Freq: Once | INTRAMUSCULAR | Status: AC
  Administered 2015-06-27: 1 mg via INTRAVENOUS

## 2015-06-27 MED ORDER — SODIUM CHLORIDE 0.9 % IV BOLUS (SEPSIS)
INTRAVENOUS | Status: DC | PRN
Start: 2015-06-27 — End: 2015-06-27
  Administered 2015-06-27: 250 mL via INTRAVENOUS

## 2015-06-27 MED ORDER — MIDAZOLAM HCL 2 MG/2ML IJ SOLN
INTRAMUSCULAR | Status: AC
  Filled 2015-06-27: qty 2

## 2015-06-27 MED ORDER — IOHEXOL 300 MG/ML  SOLN
INTRAMUSCULAR | Status: DC | PRN
  Administered 2015-06-27: 275 mL via INTRA_ARTERIAL
  Administered 2015-06-27: 130 mL via INTRA_ARTERIAL

## 2015-06-27 MED ORDER — MORPHINE SULFATE (PF) 4 MG/ML IV SOLN
INTRAVENOUS | Status: AC
  Filled 2015-06-27: qty 1

## 2015-06-27 MED ORDER — SODIUM CHLORIDE 0.9 % WEIGHT BASED INFUSION: 1.0000 mL/kg/h | INTRAVENOUS | Status: AC

## 2015-06-27 MED ORDER — ACETAMINOPHEN 325 MG PO TABS: 650.0000 mg | ORAL_TABLET | ORAL | Status: DC | PRN

## 2015-06-27 MED ORDER — FENTANYL CITRATE (PF) 100 MCG/2ML IJ SOLN
INTRAMUSCULAR | Status: AC
  Filled 2015-06-27: qty 2

## 2015-06-27 MED ORDER — TICAGRELOR 90 MG PO TABS
ORAL_TABLET | ORAL | Status: DC | PRN
  Administered 2015-06-27: 180 mg via ORAL

## 2015-06-27 MED ORDER — HEPARIN (PORCINE) IN NACL 2-0.9 UNIT/ML-% IJ SOLN
INTRAMUSCULAR | Status: AC
  Filled 2015-06-27: qty 500

## 2015-06-27 MED ORDER — MIDAZOLAM HCL 2 MG/2ML IJ SOLN
INTRAMUSCULAR | Status: DC | PRN
  Administered 2015-06-27 (×4): 1 mg via INTRAVENOUS

## 2015-06-27 MED ORDER — ASPIRIN 81 MG PO CHEW
CHEWABLE_TABLET | ORAL | Status: DC | PRN
  Administered 2015-06-27: 324 mg via ORAL

## 2015-06-27 MED ORDER — OXYCODONE-ACETAMINOPHEN 5-325 MG PO TABS
1.0000 | ORAL_TABLET | ORAL | Status: DC | PRN
  Administered 2015-06-27: 2 via ORAL
  Filled 2015-06-27: qty 2

## 2015-06-27 MED ORDER — ASPIRIN 81 MG PO CHEW: 81.0000 mg | CHEWABLE_TABLET | Freq: Every day | ORAL | Status: DC

## 2015-06-27 MED ORDER — NITROGLYCERIN IN D5W 200-5 MCG/ML-% IV SOLN: 0.0000 ug/min | INTRAVENOUS | Status: DC

## 2015-06-27 MED ORDER — NITROGLYCERIN 1 MG/10 ML FOR IR/CATH LAB
INTRA_ARTERIAL | Status: DC | PRN
Start: 2015-06-27 — End: 2015-06-27
  Administered 2015-06-27: 200 ug via INTRACORONARY

## 2015-06-27 MED ORDER — SODIUM CHLORIDE 0.9% FLUSH: 3.0000 mL | INTRAVENOUS | Status: DC | PRN

## 2015-06-27 MED ORDER — SODIUM CHLORIDE 0.9 % WEIGHT BASED INFUSION: 3.0000 mL/kg/h | INTRAVENOUS | Status: AC

## 2015-06-27 MED ORDER — ASPIRIN 81 MG PO CHEW
CHEWABLE_TABLET | ORAL | Status: AC
  Filled 2015-06-27: qty 4

## 2015-06-27 MED ORDER — NITROGLYCERIN 5 MG/ML IV SOLN
INTRAVENOUS | Status: AC
  Filled 2015-06-27: qty 10

## 2015-06-27 MED ORDER — SODIUM CHLORIDE 0.9 % IV SOLN
250.0000 mg | INTRAVENOUS | Status: DC | PRN
  Administered 2015-06-27 (×2): 1.75 mg/kg/h via INTRAVENOUS

## 2015-06-27 MED ORDER — TICAGRELOR 90 MG PO TABS
ORAL_TABLET | ORAL | Status: AC
  Filled 2015-06-27: qty 2

## 2015-06-27 MED ORDER — HEPARIN BOLUS VIA INFUSION
1700.0000 [IU] | Freq: Once | INTRAVENOUS | Status: AC
  Administered 2015-06-27: 1700 [IU] via INTRAVENOUS
  Filled 2015-06-27: qty 1700

## 2015-06-27 MED ORDER — ATORVASTATIN CALCIUM 20 MG PO TABS: 40.0000 mg | ORAL_TABLET | Freq: Every day | ORAL | Status: DC

## 2015-06-27 MED ORDER — FENTANYL CITRATE (PF) 100 MCG/2ML IJ SOLN
INTRAMUSCULAR | Status: DC | PRN
  Administered 2015-06-27 (×3): 50 ug via INTRAVENOUS

## 2015-06-27 MED ORDER — NITROGLYCERIN 0.4 MG SL SUBL
SUBLINGUAL_TABLET | SUBLINGUAL | Status: AC
  Filled 2015-06-27: qty 1

## 2015-06-27 SURGICAL SUPPLY — 19 items
BALLN TREK RX 3.0X15 (BALLOONS) ×2
BALLOON TREK RX 3.0X15 (BALLOONS) ×1 IMPLANT
CATH INFINITI 5FR ANG PIGTAIL (CATHETERS) ×2 IMPLANT
CATH INFINITI 5FR JL4 (CATHETERS) ×2 IMPLANT
CATH INFINITI 5FR JL5 (CATHETERS) ×2 IMPLANT
CATH INFINITI JR4 5F (CATHETERS) ×2 IMPLANT
CATH VISTA GUIDE 6FR XB4 SH (CATHETERS) ×2 IMPLANT
DEVICE CLOSURE MYNXGRIP 6/7F (Vascular Products) ×2 IMPLANT
DEVICE INFLAT 30 PLUS (MISCELLANEOUS) ×2 IMPLANT
DEVICE RAD TR BAND REGULAR (VASCULAR PRODUCTS) IMPLANT
DEVICE SAFEGUARD 24CM (GAUZE/BANDAGES/DRESSINGS) IMPLANT
KIT MANI 3VAL PERCEP (MISCELLANEOUS) ×2 IMPLANT
NEEDLE PERC 18GX7CM (NEEDLE) ×2 IMPLANT
PACK CARDIAC CATH (CUSTOM PROCEDURE TRAY) ×2 IMPLANT
SHEATH AVANTI 6FR X 11CM (SHEATH) ×2 IMPLANT
SHEATH PINNACLE 5F 10CM (SHEATH) ×2 IMPLANT
STENT XIENCE ALPINE RX 3.5X18 (Permanent Stent) ×2 IMPLANT
WIRE EMERALD 3MM-J .035X150CM (WIRE) ×2 IMPLANT
WIRE G HI TQ BMW 190 (WIRE) ×2 IMPLANT
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
## 2015-06-27 NOTE — Progress Notes (Signed)
ANTICOAGULATION CONSULT NOTE - Follow Up Consult  Pharmacy Consult for ACS/NSTEMI  Indication: chest pain/ACS  Allergies  Allergen Reactions  . Lisinopril Cough    Patient Measurements: Height: 6\' 2"  (188 cm) Weight: (!) 306 lb 7 oz (139 kg) IBW/kg (Calculated) : 82.2 Heparin Dosing Weight: 113 kg    Vital Signs: Temp: 97.4 F (36.3 C) (03/09 0430) Temp Source: Oral (03/09 0430) BP: 112/52 mmHg (03/09 0430) Pulse Rate: 68 (03/09 0430)  Labs:  Recent Labs  06/25/15 1629 06/25/15 2014 06/26/15 0148 06/26/15 0506 06/26/15 1317 06/26/15 2053 06/27/15 0441  HGB 16.4  --  14.1 14.4  --   --   --   HCT 48.2  --  40.4 41.9  --   --   --   PLT 245  --  202 206  --   --   --   APTT  --   --   --   --  32  --   --   LABPROT  --   --   --   --  14.8  --   --   INR  --   --   --   --  1.14  --   --   HEPARINUNFRC  --   --   --   --   --  0.18* 0.24*  CREATININE 0.80  --  0.89 0.94  --   --   --   TROPONINI <0.03 <0.03 <0.03 <0.03  --   --   --     Estimated Creatinine Clearance: 131.7 mL/min (by C-G formula based on Cr of 0.94).   Medications:  Prescriptions prior to admission  Medication Sig Dispense Refill Last Dose  . acidophilus (RISAQUAD) CAPS capsule Take 1 capsule by mouth daily.   06/25/2015 at Unknown time  . albuterol (PROVENTIL HFA;VENTOLIN HFA) 108 (90 BASE) MCG/ACT inhaler Inhale 2 puffs into the lungs every 6 (six) hours as needed for wheezing or shortness of breath.    Past Month at Unknown time  . ALPRAZolam (XANAX) 1 MG tablet Take 1 mg by mouth 3 (three) times daily.    06/25/2015 at Unknown time  . busPIRone (BUSPAR) 15 MG tablet Take 30 mg by mouth 2 (two) times daily.    06/25/2015 at Unknown time  . diclofenac sodium (VOLTAREN) 1 % GEL Apply 2 g topically 4 (four) times daily as needed (for pain).   06/25/2015 at Unknown time  . DULoxetine (CYMBALTA) 60 MG capsule Take 60 mg by mouth daily.   06/25/2015 at Unknown time  . gabapentin (NEURONTIN) 300 MG capsule  Take 600 mg by mouth 6 (six) times daily.   06/25/2015 at Unknown time  . levothyroxine (SYNTHROID, LEVOTHROID) 75 MCG tablet Take 75 mcg by mouth daily before breakfast.    06/25/2015 at Unknown time  . lidocaine (LIDODERM) 5 % Place 1 patch onto the skin daily. Remove & Discard patch within 12 hours or as directed by MD   06/25/2015 at 0530  . morphine (MS CONTIN) 15 MG 12 hr tablet Take 15-30 mg by mouth every 12 (twelve) hours. Pt takes two tablets in the morning and one at night.   06/25/2015 at 0530  . Multiple Vitamin (MULTIVITAMIN WITH MINERALS) TABS tablet Take 1 tablet by mouth daily.   06/25/2015 at Unknown time  . omeprazole (PRILOSEC) 20 MG capsule Take 20 mg by mouth daily.    06/25/2015 at Unknown time  . ondansetron (ZOFRAN-ODT) 4 MG disintegrating  tablet Take 4 mg by mouth every 8 (eight) hours as needed for nausea or vomiting.   06/25/2015 at Unknown time  . Oxycodone HCl 10 MG TABS Take 10 mg by mouth 6 (six) times daily.   06/25/2015 at 1400  . testosterone cypionate (DEPOTESTOSTERONE CYPIONATE) 200 MG/ML injection Inject 150 mg into the muscle every 14 (fourteen) days.   06/21/2015 at unknown   . zolpidem (AMBIEN) 10 MG tablet Take 10 mg by mouth at bedtime.   06/24/2015 at Unknown time    Assessment: CrCl = 131.7 ml/min   Goal of Therapy:  Heparin level 0.3-0.7 units/ml Monitor platelets by anticoagulation protocol: Yes   Plan:  3/08:  HL @ 21:00 = 0.18           Heparin 3400 units IV X 1 bolus and increase drip rate to 1850 units/hr.             Will recheck HL 6 hrs after rate change on 3/9 @ 4:00.   3/9 0400 heparin level 0.24. 1700 bolus and increase to 2100 units/hr. Recheck in 6 hours.  Fatin Bachicha S 06/27/2015,7:13 AM

## 2015-06-27 NOTE — Progress Notes (Signed)
SUBJECTIVE: Continues to have intermittent chest pain and palpitation   Filed Vitals:   06/26/15 1128 06/26/15 1945 06/27/15 0430 06/27/15 0828  BP: 124/61 148/74 112/52 134/88  Pulse: 81 76 68 72  Temp: 98 F (36.7 C) 98.5 F (36.9 C) 97.4 F (36.3 C) 97.9 F (36.6 C)  TempSrc: Oral  Oral Oral  Resp: 20 20 22 17   Height:      Weight:      SpO2: 98% 95% 95% 98%    Intake/Output Summary (Last 24 hours) at 06/27/15 0912 Last data filed at 06/27/15 [id number]  Gross per 24 hour  Intake 444.05 ml  Output    600 ml  Net -155.95 ml    LABS: Basic Metabolic Panel:  Recent Labs  06/25/15 1629 06/26/15 0148 06/26/15 0506  NA 142  --  143  K 4.0  --  4.5  CL 107  --  108  CO2 26  --  31  GLUCOSE 84  --  94  BUN 10  --  11  CREATININE 0.80 0.89 0.94  CALCIUM 9.1  --  8.5*   Liver Function Tests: No results for input(s): AST, ALT, ALKPHOS, BILITOT, PROT, ALBUMIN in the last 72 hours. No results for input(s): LIPASE, AMYLASE in the last 72 hours. CBC:  Recent Labs  06/26/15 0148 06/26/15 0506  WBC 8.0 6.9  HGB 14.1 14.4  HCT 40.4 41.9  MCV 93.1 94.7  PLT 202 206   Cardiac Enzymes:  Recent Labs  06/25/15 2014 06/26/15 0148 06/26/15 0506  TROPONINI <0.03 <0.03 <0.03   BNP: Invalid input(s): POCBNP D-Dimer: No results for input(s): DDIMER in the last 72 hours. Hemoglobin A1C:  Recent Labs  06/25/15 2014  HGBA1C 5.4   Fasting Lipid Panel:  Recent Labs  06/26/15 0506  CHOL 193  HDL 25*  LDLCALC 108*  TRIG 301*  CHOLHDL 7.7   Thyroid Function Tests: No results for input(s): TSH, T4TOTAL, T3FREE, THYROIDAB in the last 72 hours.  Invalid input(s): FREET3 Anemia Panel: No results for input(s): VITAMINB12, FOLATE, FERRITIN, TIBC, IRON, RETICCTPCT in the last 72 hours.   PHYSICAL EXAM General: Well developed, well nourished, in no acute distress HEENT:  Normocephalic and atramatic Neck:  No JVD.  Lungs: Clear bilaterally to auscultation and  percussion. Heart: HRRR . Normal S1 and S2 without gallops or murmurs.  Abdomen: Bowel sounds are positive, abdomen soft and non-tender  Msk:  Back normal, normal gait. Normal strength and tone for age. Extremities: No clubbing, cyanosis or edema.   Neuro: Alert and oriented X 3. Psych:  Good affect, responds appropriately  TELEMETRY:Sinus rhythm  ASSESSMENT AND PLAN: Unstable angina with acute coronary syndrome plan on doing cardiac catheterization today.  Principal Problem:   Chest pain    Beata Beason A, MD, [facility] 06/27/2015 9:12 AM

## 2015-06-27 NOTE — Progress Notes (Signed)
Pt accepted in transfer from specials. Pt a/o. Stood to void. Painfree. Rt groin site clean. Dressing intact. Pos pedal pulses. Lunch ordered

## 2015-06-28 ENCOUNTER — Encounter: Payer: Self-pay | Admitting: Cardiovascular Disease

## 2015-06-28 MED ORDER — NITROGLYCERIN 0.4 MG SL SUBL: 0.4000 mg | SUBLINGUAL_TABLET | SUBLINGUAL | Status: DC | PRN

## 2015-06-28 MED ORDER — ASPIRIN 81 MG PO CHEW
81.0000 mg | CHEWABLE_TABLET | Freq: Every day | ORAL | Status: DC
Start: 2015-06-28 — End: 2016-06-30

## 2015-06-28 MED ORDER — TICAGRELOR 90 MG PO TABS: 90.0000 mg | ORAL_TABLET | Freq: Two times a day (BID) | ORAL | Status: DC

## 2015-06-28 MED ORDER — METOPROLOL TARTRATE 25 MG PO TABS: 25.0000 mg | ORAL_TABLET | Freq: Two times a day (BID) | ORAL | Status: DC

## 2015-06-28 MED ORDER — ATORVASTATIN CALCIUM 40 MG PO TABS: 40.0000 mg | ORAL_TABLET | Freq: Every day | ORAL | Status: DC

## 2015-06-28 NOTE — Care Management (Signed)
Patient to discharge home today on new medication Brilinta. Patient verbalizes he does have pharmacy coverage on his insurance plan.  Provided patient with Brilinta coupon for 30 days and subsequent copay assist

## 2015-06-28 NOTE — Progress Notes (Cosign Needed)
SUBJECTIVE: Patient is feeling much better no chest pain or shortness of breath   Filed Vitals:   06/27/15 1459 06/27/15 1932 06/27/15 2130 06/28/15 0458  BP: 123/74 94/54 135/99 135/89  Pulse: 71 71 102 76  Temp: 97.7 F (36.5 C) 97.6 F (36.4 C)  97.9 F (36.6 C)  TempSrc: Oral Oral  Oral  Resp: 18 20  20   Height:      Weight:      SpO2: 95% 95%  98%    Intake/Output Summary (Last 24 hours) at 06/28/15 0816 Last data filed at 06/27/15 1339  Gross per 24 hour  Intake    600 ml  Output    975 ml  Net   -375 ml    LABS: Basic Metabolic Panel:  Recent Labs  06/25/15 1629 06/26/15 0148 06/26/15 0506  NA 142  --  143  K 4.0  --  4.5  CL 107  --  108  CO2 26  --  31  GLUCOSE 84  --  94  BUN 10  --  11  CREATININE 0.80 0.89 0.94  CALCIUM 9.1  --  8.5*   Liver Function Tests: No results for input(s): AST, ALT, ALKPHOS, BILITOT, PROT, ALBUMIN in the last 72 hours. No results for input(s): LIPASE, AMYLASE in the last 72 hours. CBC:  Recent Labs  06/26/15 0506 06/27/15 1516  WBC 6.9 6.6  HGB 14.4 15.7  HCT 41.9 46.5  MCV 94.7 94.5  PLT 206 226   Cardiac Enzymes:  Recent Labs  06/26/15 0148 06/26/15 0506 06/27/15 1516  CKMB  --   --  1.7  TROPONINI <0.03 <0.03 <0.03   BNP: Invalid input(s): POCBNP D-Dimer: No results for input(s): DDIMER in the last 72 hours. Hemoglobin A1C:  Recent Labs  06/25/15 2014  HGBA1C 5.4   Fasting Lipid Panel:  Recent Labs  06/26/15 0506  CHOL 193  HDL 25*  LDLCALC 108*  TRIG 301*  CHOLHDL 7.7   Thyroid Function Tests: No results for input(s): TSH, T4TOTAL, T3FREE, THYROIDAB in the last 72 hours.  Invalid input(s): FREET3 Anemia Panel: No results for input(s): VITAMINB12, FOLATE, FERRITIN, TIBC, IRON, RETICCTPCT in the last 72 hours.   PHYSICAL EXAM General: Well developed, well nourished, in no acute distress HEENT:  Normocephalic and atramatic Neck:  No JVD.  Lungs: Clear bilaterally to  auscultation and percussion. Heart: HRRR . Normal S1 and S2 without gallops or murmurs.  Abdomen: Bowel sounds are positive, abdomen soft and non-tender  Msk:  Back normal, normal gait. Normal strength and tone for age. Extremities: No clubbing, cyanosis or edema.   Neuro: Alert and oriented X 3. Psych:  Good affect, responds appropriately  TELEMETRY:Sinus rhythm  ASSESSMENT AND PLAN: High-grade lesion about 70-80% in proximal to mid LAD which required PCI and drug-eluting stent to leading to 0%. Patient is doing very well no chest pain may go home on the length aspirin and statin and beta blocker. He has a follow-up 1:00 on next Thursday in my office. Mode the top Principal Problem:   Chest pain    Selda Jalbert A, MD, [facility] 06/28/2015 8:16 AM
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
## 2015-06-28 NOTE — Progress Notes (Addendum)
Patient discharged via wheelchair and private vehicle. IV removed and catheter intact. All discharge instructions such as importance of taking medications as prescribed, post cath information given and patient verbalizes understanding. Tele removed and returned. Prescriptions given to patient. Right groin no signs of bleeding or hematoma noted. No distress noted at time of discharge

## 2015-06-28 NOTE — Progress Notes (Signed)
Patient rested quietly tonight with no complaints of pain. Post-cath site stable with no complications. A&Ox4, VSS, and NSR on tele. Nursing staff will continue to monitor. Earleen Reaper, RN

## 2015-07-09 ENCOUNTER — Emergency Department: Payer: BLUE CROSS/BLUE SHIELD

## 2015-07-09 ENCOUNTER — Encounter: Payer: Self-pay | Admitting: Emergency Medicine

## 2015-07-09 ENCOUNTER — Observation Stay
Admission: EM | Admit: 2015-07-09 | Discharge: 2015-07-11 | Disposition: A | Discharge status: Home / Self Care | Payer: BLUE CROSS/BLUE SHIELD | Attending: Internal Medicine | Admitting: Internal Medicine

## 2015-07-09 DIAGNOSIS — Z9884 Bariatric surgery status: Secondary | ICD-10-CM | POA: Insufficient documentation

## 2015-07-09 DIAGNOSIS — R079 Chest pain, unspecified: Secondary | ICD-10-CM | POA: Diagnosis present

## 2015-07-09 DIAGNOSIS — I10 Essential (primary) hypertension: Secondary | ICD-10-CM | POA: Diagnosis not present

## 2015-07-09 DIAGNOSIS — R002 Palpitations: Secondary | ICD-10-CM | POA: Insufficient documentation

## 2015-07-09 DIAGNOSIS — Z833 Family history of diabetes mellitus: Secondary | ICD-10-CM | POA: Insufficient documentation

## 2015-07-09 DIAGNOSIS — J42 Unspecified chronic bronchitis: Secondary | ICD-10-CM | POA: Diagnosis not present

## 2015-07-09 DIAGNOSIS — G8929 Other chronic pain: Secondary | ICD-10-CM | POA: Insufficient documentation

## 2015-07-09 DIAGNOSIS — G4730 Sleep apnea, unspecified: Secondary | ICD-10-CM | POA: Diagnosis not present

## 2015-07-09 DIAGNOSIS — Z791 Long term (current) use of non-steroidal anti-inflammatories (NSAID): Secondary | ICD-10-CM | POA: Diagnosis not present

## 2015-07-09 DIAGNOSIS — Z79899 Other long term (current) drug therapy: Secondary | ICD-10-CM | POA: Diagnosis not present

## 2015-07-09 DIAGNOSIS — Z981 Arthrodesis status: Secondary | ICD-10-CM | POA: Diagnosis not present

## 2015-07-09 DIAGNOSIS — E039 Hypothyroidism, unspecified: Secondary | ICD-10-CM | POA: Diagnosis not present

## 2015-07-09 DIAGNOSIS — R0602 Shortness of breath: Secondary | ICD-10-CM | POA: Insufficient documentation

## 2015-07-09 DIAGNOSIS — G4700 Insomnia, unspecified: Secondary | ICD-10-CM | POA: Insufficient documentation

## 2015-07-09 DIAGNOSIS — F329 Major depressive disorder, single episode, unspecified: Secondary | ICD-10-CM | POA: Diagnosis not present

## 2015-07-09 DIAGNOSIS — Z955 Presence of coronary angioplasty implant and graft: Secondary | ICD-10-CM | POA: Insufficient documentation

## 2015-07-09 DIAGNOSIS — K219 Gastro-esophageal reflux disease without esophagitis: Secondary | ICD-10-CM | POA: Diagnosis not present

## 2015-07-09 DIAGNOSIS — R000 Tachycardia, unspecified: Secondary | ICD-10-CM | POA: Insufficient documentation

## 2015-07-09 DIAGNOSIS — Z79890 Hormone replacement therapy (postmenopausal): Secondary | ICD-10-CM | POA: Insufficient documentation

## 2015-07-09 DIAGNOSIS — Z888 Allergy status to other drugs, medicaments and biological substances status: Secondary | ICD-10-CM | POA: Diagnosis not present

## 2015-07-09 DIAGNOSIS — R918 Other nonspecific abnormal finding of lung field: Secondary | ICD-10-CM | POA: Diagnosis not present

## 2015-07-09 DIAGNOSIS — I313 Pericardial effusion (noninflammatory): Secondary | ICD-10-CM | POA: Insufficient documentation

## 2015-07-09 DIAGNOSIS — F419 Anxiety disorder, unspecified: Secondary | ICD-10-CM | POA: Diagnosis not present

## 2015-07-09 DIAGNOSIS — Z79891 Long term (current) use of opiate analgesic: Secondary | ICD-10-CM | POA: Diagnosis not present

## 2015-07-09 DIAGNOSIS — M1990 Unspecified osteoarthritis, unspecified site: Secondary | ICD-10-CM | POA: Diagnosis not present

## 2015-07-09 DIAGNOSIS — Z7982 Long term (current) use of aspirin: Secondary | ICD-10-CM | POA: Diagnosis not present

## 2015-07-09 DIAGNOSIS — I259 Chronic ischemic heart disease, unspecified: Secondary | ICD-10-CM | POA: Diagnosis not present

## 2015-07-09 LAB — COMPREHENSIVE METABOLIC PANEL
ALBUMIN: 3.9 g/dL (ref 3.5–5.0)
ALT: 20 U/L (ref 17–63)
ANION GAP: 9 (ref 5–15)
AST: 28 U/L (ref 15–41)
Alkaline Phosphatase: 100 U/L (ref 38–126)
BILIRUBIN TOTAL: 1.5 mg/dL — AB (ref 0.3–1.2)
BUN: 12 mg/dL (ref 6–20)
CHLORIDE: 103 mmol/L (ref 101–111)
CO2: 21 mmol/L — ABNORMAL LOW (ref 22–32)
Calcium: 8.4 mg/dL — ABNORMAL LOW (ref 8.9–10.3)
Creatinine, Ser: 0.96 mg/dL (ref 0.61–1.24)
GFR calc Af Amer: >60 mL/min (ref >60)
Glucose, Bld: 103 mg/dL — ABNORMAL HIGH (ref 65–99)
POTASSIUM: 3.4 mmol/L — AB (ref 3.5–5.1)
Sodium: 133 mmol/L — ABNORMAL LOW (ref 135–145)
Total Protein: 6.7 g/dL (ref 6.5–8.1)

## 2015-07-09 LAB — CBC
HEMATOCRIT: 44.9 % (ref 40.0–52.0)
Hemoglobin: 15.3 g/dL (ref 13.0–18.0)
MCH: 32.2 pg (ref 26.0–34.0)
MCHC: 34.1 g/dL (ref 32.0–36.0)
MCV: 94.5 fL (ref 80.0–100.0)
PLATELETS: 216 10*3/uL (ref 150–440)
RBC: 4.75 MIL/uL (ref 4.40–5.90)
RDW: 16.3 % — AB (ref 11.5–14.5)
WBC: 7.8 10*3/uL (ref 3.8–10.6)

## 2015-07-09 LAB — RAPID INFLUENZA A&B ANTIGENS (ARMC ONLY)
INFLUENZA A (ARMC): NEGATIVE
INFLUENZA B (ARMC): NEGATIVE

## 2015-07-09 LAB — TROPONIN I

## 2015-07-09 MED ORDER — ONDANSETRON HCL 4 MG/2ML IJ SOLN
4.0000 mg | Freq: Once | INTRAMUSCULAR | Status: AC
  Administered 2015-07-09: 4 mg via INTRAVENOUS
  Filled 2015-07-09: qty 2

## 2015-07-09 MED ORDER — IOHEXOL 350 MG/ML SOLN
100.0000 mL | Freq: Once | INTRAVENOUS | Status: AC | PRN
  Administered 2015-07-09: 100 mL via INTRAVENOUS

## 2015-07-09 MED ORDER — MORPHINE SULFATE (PF) 4 MG/ML IV SOLN
4.0000 mg | Freq: Once | INTRAVENOUS | Status: AC
  Administered 2015-07-09: 4 mg via INTRAVENOUS
  Filled 2015-07-09: qty 1

## 2015-07-09 NOTE — ED Notes (Signed)
Patient transported to CT 

## 2015-07-09 NOTE — ED Provider Notes (Signed)
[facility] Emergency Department Provider Note  ____________________________________________  Time seen: 11:00 PM  I have reviewed the triage vital signs and the nursing notes.   HISTORY  Chief Complaint Chest Pain    HPI Charles [name] is a 55 y.o. male to presents with acute onset of left-sided chest pain with radiation into the left arm at 4:00 PM today. Patient also admits to dyspnea chills and fever. Patient's temperature arrival to emergency department 100.5 with a heart rate 119 O2 saturation 92% on room air      Past Medical History  Diagnosis Date  . Shortness of breath   . Sleep apnea     has cpap-has not used since lost 140lb  . GERD (gastroesophageal reflux disease)   . Peptic ulcer   . Chronic back pain   . Spinal cord stimulator status     has a scs  . Depression   . Anxiety   . Arthritis   . Wears glasses   . Hypothyroidism   . Insomnia   . Low testosterone     Patient Active Problem List   Diagnosis Date Noted  . Chest pain 06/25/2015    Past Surgical History  Procedure Laterality Date  . Gastric bypass  2011    has lost 140lb  . Spinal cord stimulator implant  6/13  . Repair tendons foot  2002    rt foot  . Facial fracture surgery      face-upper jaw with dental implants  . Closed reduction nasal fracture  12/22/2011    Procedure: CLOSED REDUCTION NASAL FRACTURE;  Surgeon: Ascencion Dike, MD;  Location: [facility];  Service: ENT;  Laterality: N/A;  closed reduction of nasal fracture  . Cardiac catheterization N/A 06/27/2015    Procedure: Left Heart Cath and Coronary Angiography;  Surgeon: Dionisio David, MD;  Location: [facility];  Service: Cardiovascular;  Laterality: N/A;  . Cardiac catheterization N/A 06/27/2015    Procedure: Coronary Stent Intervention;  Surgeon: Yolonda Kida, MD;  Location: [facility];  Service: Cardiovascular;  Laterality: N/A;    Current Outpatient Rx  Name   Route  Sig  Dispense  Refill  . acidophilus (RISAQUAD) CAPS capsule   Oral   Take 1 capsule by mouth daily.         Marland Kitchen albuterol (PROVENTIL HFA;VENTOLIN HFA) 108 (90 BASE) MCG/ACT inhaler   Inhalation   Inhale 2 puffs into the lungs every 6 (six) hours as needed for wheezing or shortness of breath.          . ALPRAZolam (XANAX) 1 MG tablet   Oral   Take 1 mg by mouth 3 (three) times daily.          Marland Kitchen aspirin 81 MG chewable tablet   Oral   Chew 1 tablet (81 mg total) by mouth daily.   30 tablet   0   . atorvastatin (LIPITOR) 40 MG tablet   Oral   Take 1 tablet (40 mg total) by mouth daily at 6 PM.   30 tablet   0   . busPIRone (BUSPAR) 15 MG tablet   Oral   Take 30 mg by mouth 2 (two) times daily.          . diclofenac sodium (VOLTAREN) 1 % GEL   Topical   Apply 2 g topically 4 (four) times daily as needed (for pain).         Marland Kitchen  DULoxetine (CYMBALTA) 60 MG capsule   Oral   Take 60 mg by mouth daily.         Marland Kitchen gabapentin (NEURONTIN) 300 MG capsule   Oral   Take 600 mg by mouth 6 (six) times daily.         Marland Kitchen levothyroxine (SYNTHROID, LEVOTHROID) 75 MCG tablet   Oral   Take 75 mcg by mouth daily before breakfast.          . metoprolol tartrate (LOPRESSOR) 25 MG tablet   Oral   Take 1 tablet (25 mg total) by mouth 2 (two) times daily.   60 tablet   0   . morphine (MS CONTIN) 15 MG 12 hr tablet   Oral   Take 15-30 mg by mouth every 12 (twelve) hours. Pt takes two tablets in the morning and one at night.         . Multiple Vitamin (MULTIVITAMIN WITH MINERALS) TABS tablet   Oral   Take 1 tablet by mouth daily.         . nitroGLYCERIN (NITROSTAT) 0.4 MG SL tablet   Sublingual   Place 1 tablet (0.4 mg total) under the tongue every 5 (five) minutes as needed for chest pain. Take max 3 tablets for one episode.   30 tablet   0   . omeprazole (PRILOSEC) 20 MG capsule   Oral   Take 20 mg by mouth daily.          . ondansetron (ZOFRAN-ODT) 4 MG  disintegrating tablet   Oral   Take 4 mg by mouth every 8 (eight) hours as needed for nausea or vomiting.         . Oxycodone HCl 10 MG TABS   Oral   Take 10 mg by mouth 6 (six) times daily.         Marland Kitchen testosterone cypionate (DEPOTESTOSTERONE CYPIONATE) 200 MG/ML injection   Intramuscular   Inject 150 mg into the muscle every 14 (fourteen) days.         . ticagrelor (BRILINTA) 90 MG TABS tablet   Oral   Take 1 tablet (90 mg total) by mouth 2 (two) times daily.   60 tablet   0   . zolpidem (AMBIEN) 10 MG tablet   Oral   Take 10 mg by mouth at bedtime.           Allergies Lisinopril  Family History  Problem Relation Age of Onset  . Diabetes Father     Social History Social History  Substance Use Topics  . Smoking status: Never Smoker   . Smokeless tobacco: None  . Alcohol Use: No     Comment: not in 4 yr    Review of Systems  Constitutional: positivefor fever. Eyes: Negative for visual changes. ENT: Negative for sore throat. Cardiovascular: Negative for chest pain. Respiratory: as a for cough and Gastrointestinal: Negative for abdominal pain, vomiting and diarrhea. Genitourinary: Negative for dysuria. Musculoskeletal: Negative for back pain. Skin: Negative for rash. Neurological: Negative for headaches, focal weakness or numbness.   10-point ROS otherwise negative.  ____________________________________________   PHYSICAL EXAM:  VITAL SIGNS: ED Triage Vitals  Enc Vitals Group     BP --      Pulse Rate 07/09/15 2221 119     Resp 07/09/15 2221 16     Temp 07/09/15 2221 100.5 F (38.1 C)     Temp Source 07/09/15 2221 Oral     SpO2 07/09/15 2217 99 %  Weight 07/09/15 2221 306 lb (138.801 kg)     Height 07/09/15 2221 6\' 2"  (1.88 m)     Head Cir --      Peak Flow --      Pain Score 07/09/15 2224 5     Pain Loc --      Pain Edu? --      Excl. in [place]? --      Constitutional: Alert and oriented. Well appearing and in no distress. Eyes:  Conjunctivae are normal. PERRL. Normal extraocular movements. ENT   Head: Normocephalic and atraumatic.   Nose: No congestion/rhinnorhea.   Mouth/Throat: Mucous membranes are moist.   Neck: No stridor. Hematological/Lymphatic/Immunilogical: No cervical lymphadenopathy. Cardiovascular: Normal rate, regular rhythm. Normal and symmetric distal pulses are present in all extremities. No murmurs, rubs, or gallops. Respiratory: Normal respiratory effort without tachypnea nor retractions. Breath sounds are clear and equal bilaterally. No wheezes/rales/rhonchi. Gastrointestinal: Soft and nontender. No distention. There is no CVA tenderness. Genitourinary: deferred Musculoskeletal: Nontender with normal range of motion in all extremities. No joint effusions.  No lower extremity tenderness nor edema. Neurologic:  Normal speech and language. No gross focal neurologic deficits are appreciated. Speech is normal.  Skin:  Skin is warm, dry and intact. No rash noted. Psychiatric: Mood and affect are normal. Speech and behavior are normal. Patient exhibits appropriate insight and judgment.  ____________________________________________    LABS (pertinent positives/negatives)  Labs Reviewed  CBC - Abnormal; Notable for the following:    RDW 16.3 (*)    All other components within normal limits  COMPREHENSIVE METABOLIC PANEL - Abnormal; Notable for the following:    Sodium 133 (*)    Potassium 3.4 (*)    CO2 21 (*)    Glucose, Bld 103 (*)    Calcium 8.4 (*)    Total Bilirubin 1.5 (*)    All other components within normal limits  RAPID INFLUENZA A&B ANTIGENS ([facility] ONLY)  CULTURE, BLOOD (ROUTINE X 2)  CULTURE, BLOOD (ROUTINE X 2)  TROPONIN I     ____________________________________________   EKG  ED ECG REPORT I, Butner N Marri Mcneff, the attending physician, personally viewed and interpreted this ECG.   Date: 07/09/2015  EKG Time: 10:21 PM  Rate: 117  Rhythm: Sinus tachycardia   Axis: None  Intervals:normal  ST&T Change: none   ____________________________________________    RADIOLOGY  CT Angio Chest PE W/Cm &/Or Wo Cm (Final result) Result time: 07/10/15 00:03:23   Final result by Rad Results In Interface (07/10/15 00:03:23)   Narrative:   CLINICAL DATA: Chest pain today. Stent placed in March.  EXAM: CT ANGIOGRAPHY CHEST WITH CONTRAST  TECHNIQUE: Multidetector CT imaging of the chest was performed using the standard protocol during bolus administration of intravenous contrast. Multiplanar CT image reconstructions and MIPs were obtained to evaluate the vascular anatomy.  CONTRAST: 171mL OMNIPAQUE IOHEXOL 350 MG/ML SOLN  COMPARISON: None.  FINDINGS: Suboptimal contrast bolus precludes evaluation of segmental and distal pulmonary arteries. However, there is no significant filling defect in the central pulmonary arteries to suggest large pulmonary embolus.  Normal heart size. Normal caliber thoracic aorta. Coronary artery stent. Great vessel origins are patent. Esophagus is decompressed. No significant lymphadenopathy in the chest.  Evaluation of lungs is limited due to respiratory motion artifact. There are presumed atelectatic changes in the lung bases. No focal consolidation. Peribronchial thickening may represent chronic bronchitis. No pleural effusions. No pneumothorax.  Included portions of the upper abdominal organs demonstrate postoperative changes consistent with gastric bypass.  Postoperative changes in the thoracolumbar spine with posterior fixation. Bilateral rib deformities consistent with old fractures.  Review of the MIP images confirms the above findings.  IMPRESSION: Suboptimal contrast bolus limits evaluation of the pulmonary arteries but no large central pulmonary emboli are visualized. No evidence of active pulmonary disease. Peribronchial thickening suggesting chronic bronchitis.   Electronically  Signed By: Lucienne Capers M.D. On: 07/10/2015 00:03          DG Chest Portable 1 View (Final result) Result time: 07/09/15 22:58:14   Final result by Rad Results In Interface (07/09/15 22:58:14)   Narrative:   CLINICAL DATA: 55 year old male with chest pain and shortness of breath  EXAM: PORTABLE CHEST 1 VIEW  COMPARISON: Radiograph dated 06/25/2015  FINDINGS: Single-view of the chest demonstrates clear lungs. There is no pleural effusion or pneumothorax. Stable cardiac silhouette. Stable appearing thoracic spine fusion hardware again noted.  IMPRESSION: No active disease.   Electronically Signed By: Anner Crete M.D. On: 07/09/2015 22:58       INITIAL IMPRESSION / ASSESSMENT AND PLAN / ED COURSE  Pertinent labs & imaging results that were available during my care of the patient were reviewed by me and considered in my medical decision making (see chart for details).    ____________________________________________   FINAL CLINICAL IMPRESSION(S) / ED DIAGNOSES  Final diagnoses:  Chest pain, unspecified chest pain type      Gregor Hams, MD 07/12/15 [phone]

## 2015-07-09 NOTE — ED Notes (Signed)
Pt arrived from home by EMS with c/o chest pain that started at 1600 today. EMS reports pt had a stent placed in March, pt under a lot of stress recently, pt had wife committed on Thursday for ETOH rehab. Pt administered 1 Nitro at home, EMS administered 324 Asprin. Pts pain currently 5/10.

## 2015-07-10 DIAGNOSIS — J42 Unspecified chronic bronchitis: Secondary | ICD-10-CM | POA: Diagnosis present

## 2015-07-10 DIAGNOSIS — I10 Essential (primary) hypertension: Secondary | ICD-10-CM | POA: Diagnosis present

## 2015-07-10 DIAGNOSIS — K219 Gastro-esophageal reflux disease without esophagitis: Secondary | ICD-10-CM | POA: Diagnosis present

## 2015-07-10 DIAGNOSIS — F419 Anxiety disorder, unspecified: Secondary | ICD-10-CM | POA: Diagnosis present

## 2015-07-10 LAB — CBC
HCT: 41.2 % (ref 40.0–52.0)
Hemoglobin: 14.2 g/dL (ref 13.0–18.0)
MCH: 32.8 pg (ref 26.0–34.0)
MCHC: 34.6 g/dL (ref 32.0–36.0)
MCV: 94.8 fL (ref 80.0–100.0)
PLATELETS: 190 10*3/uL (ref 150–440)
RBC: 4.34 MIL/uL — AB (ref 4.40–5.90)
RDW: 16.2 % — ABNORMAL HIGH (ref 11.5–14.5)
WBC: 4.7 10*3/uL (ref 3.8–10.6)

## 2015-07-10 LAB — BASIC METABOLIC PANEL
Anion gap: 7 (ref 5–15)
BUN: 9 mg/dL (ref 6–20)
CO2: 24 mmol/L (ref 22–32)
CREATININE: 0.94 mg/dL (ref 0.61–1.24)
Calcium: 8.2 mg/dL — ABNORMAL LOW (ref 8.9–10.3)
Chloride: 104 mmol/L (ref 101–111)
GFR calc Af Amer: >60 mL/min (ref >60)
Glucose, Bld: 144 mg/dL — ABNORMAL HIGH (ref 65–99)
POTASSIUM: 3.2 mmol/L — AB (ref 3.5–5.1)
SODIUM: 135 mmol/L (ref 135–145)

## 2015-07-10 LAB — TROPONIN I
Troponin I: <0.03 ng/mL (ref <0.031)
Troponin I: <0.03 ng/mL (ref <0.031)

## 2015-07-10 MED ORDER — SODIUM CHLORIDE 0.9% FLUSH
3.0000 mL | Freq: Two times a day (BID) | INTRAVENOUS | Status: DC
  Administered 2015-07-10 – 2015-07-11 (×3): 3 mL via INTRAVENOUS

## 2015-07-10 MED ORDER — ASPIRIN 81 MG PO CHEW
81.0000 mg | CHEWABLE_TABLET | Freq: Every day | ORAL | Status: DC
  Administered 2015-07-10 – 2015-07-11 (×2): 81 mg via ORAL
  Filled 2015-07-10 (×2): qty 1

## 2015-07-10 MED ORDER — ONDANSETRON HCL 4 MG PO TABS: 4.0000 mg | ORAL_TABLET | Freq: Four times a day (QID) | ORAL | Status: DC | PRN

## 2015-07-10 MED ORDER — ONDANSETRON HCL 4 MG/2ML IJ SOLN: 4.0000 mg | Freq: Four times a day (QID) | INTRAMUSCULAR | Status: DC | PRN

## 2015-07-10 MED ORDER — GABAPENTIN 300 MG PO CAPS
600.0000 mg | ORAL_CAPSULE | Freq: Four times a day (QID) | ORAL | Status: DC
  Administered 2015-07-10 – 2015-07-11 (×5): 600 mg via ORAL
  Filled 2015-07-10 (×5): qty 2

## 2015-07-10 MED ORDER — ENOXAPARIN SODIUM 40 MG/0.4ML ~~LOC~~ SOLN
40.0000 mg | SUBCUTANEOUS | Status: DC
  Administered 2015-07-10: 21:00:00 40 mg via SUBCUTANEOUS
  Filled 2015-07-10: qty 0.4

## 2015-07-10 MED ORDER — LEVOFLOXACIN IN D5W 750 MG/150ML IV SOLN
750.0000 mg | Freq: Once | INTRAVENOUS | Status: AC
  Administered 2015-07-10: 750 mg via INTRAVENOUS
  Filled 2015-07-10: qty 150

## 2015-07-10 MED ORDER — AZITHROMYCIN 250 MG PO TABS: 250.0000 mg | ORAL_TABLET | Freq: Every day | ORAL | Status: DC

## 2015-07-10 MED ORDER — AZITHROMYCIN 250 MG PO TABS: 500.0000 mg | ORAL_TABLET | Freq: Every day | ORAL | Status: DC

## 2015-07-10 MED ORDER — OXYCODONE HCL 5 MG PO TABS: 10.0000 mg | ORAL_TABLET | Freq: Four times a day (QID) | ORAL | Status: DC | PRN

## 2015-07-10 MED ORDER — ACETAMINOPHEN 650 MG RE SUPP: 650.0000 mg | Freq: Four times a day (QID) | RECTAL | Status: DC | PRN

## 2015-07-10 MED ORDER — BUSPIRONE HCL 10 MG PO TABS
30.0000 mg | ORAL_TABLET | Freq: Two times a day (BID) | ORAL | Status: DC
  Administered 2015-07-10 – 2015-07-11 (×3): 30 mg via ORAL
  Filled 2015-07-10 (×3): qty 3

## 2015-07-10 MED ORDER — AZITHROMYCIN 250 MG PO TABS
500.0000 mg | ORAL_TABLET | Freq: Every day | ORAL | Status: DC
  Administered 2015-07-11: 500 mg via ORAL
  Filled 2015-07-10: qty 2

## 2015-07-10 MED ORDER — TICAGRELOR 90 MG PO TABS
90.0000 mg | ORAL_TABLET | Freq: Two times a day (BID) | ORAL | Status: DC
  Administered 2015-07-10 – 2015-07-11 (×3): 90 mg via ORAL
  Filled 2015-07-10 (×5): qty 1

## 2015-07-10 MED ORDER — PANTOPRAZOLE SODIUM 40 MG PO TBEC
40.0000 mg | DELAYED_RELEASE_TABLET | Freq: Every day | ORAL | Status: DC
  Administered 2015-07-10 – 2015-07-11 (×2): 40 mg via ORAL
  Filled 2015-07-10 (×2): qty 1

## 2015-07-10 MED ORDER — PREDNISONE 20 MG PO TABS
50.0000 mg | ORAL_TABLET | Freq: Every day | ORAL | Status: DC
  Administered 2015-07-10 – 2015-07-11 (×2): 50 mg via ORAL
  Filled 2015-07-10 (×2): qty 2

## 2015-07-10 MED ORDER — MORPHINE SULFATE (PF) 4 MG/ML IV SOLN
INTRAVENOUS | Status: AC
  Filled 2015-07-10: qty 1

## 2015-07-10 MED ORDER — LEVOTHYROXINE SODIUM 75 MCG PO TABS
75.0000 ug | ORAL_TABLET | Freq: Every day | ORAL | Status: DC
  Administered 2015-07-10 – 2015-07-11 (×2): 75 ug via ORAL
  Filled 2015-07-10 (×2): qty 1

## 2015-07-10 MED ORDER — DULOXETINE HCL 60 MG PO CPEP
60.0000 mg | ORAL_CAPSULE | Freq: Every day | ORAL | Status: DC
  Administered 2015-07-10 – 2015-07-11 (×2): 60 mg via ORAL
  Filled 2015-07-10 (×2): qty 1

## 2015-07-10 MED ORDER — GABAPENTIN 600 MG PO TABS
300.0000 mg | ORAL_TABLET | Freq: Once | ORAL | Status: AC
  Administered 2015-07-10: 300 mg via ORAL
  Filled 2015-07-10: qty 1

## 2015-07-10 MED ORDER — ACETAMINOPHEN 325 MG PO TABS: 650.0000 mg | ORAL_TABLET | Freq: Four times a day (QID) | ORAL | Status: DC | PRN

## 2015-07-10 MED ORDER — MORPHINE SULFATE (PF) 2 MG/ML IV SOLN
2.0000 mg | INTRAVENOUS | Status: DC | PRN
  Administered 2015-07-10 (×3): 2 mg via INTRAVENOUS
  Filled 2015-07-10 (×4): qty 1

## 2015-07-10 MED ORDER — MORPHINE SULFATE (PF) 4 MG/ML IV SOLN
4.0000 mg | Freq: Once | INTRAVENOUS | Status: AC
  Administered 2015-07-10: 4 mg via INTRAVENOUS

## 2015-07-10 MED ORDER — ATORVASTATIN CALCIUM 20 MG PO TABS
40.0000 mg | ORAL_TABLET | Freq: Every day | ORAL | Status: DC
  Administered 2015-07-10: 40 mg via ORAL
  Filled 2015-07-10 (×2): qty 2

## 2015-07-10 MED ORDER — MORPHINE SULFATE ER 15 MG PO TBCR
15.0000 mg | EXTENDED_RELEASE_TABLET | Freq: Two times a day (BID) | ORAL | Status: DC
  Administered 2015-07-10 – 2015-07-11 (×3): 15 mg via ORAL
  Filled 2015-07-10 (×3): qty 1

## 2015-07-10 MED ORDER — ALPRAZOLAM 1 MG PO TABS
1.0000 mg | ORAL_TABLET | Freq: Three times a day (TID) | ORAL | Status: DC
  Administered 2015-07-10 – 2015-07-11 (×4): 1 mg via ORAL
  Filled 2015-07-10 (×4): qty 1

## 2015-07-10 MED ORDER — POTASSIUM CHLORIDE CRYS ER 20 MEQ PO TBCR
40.0000 meq | EXTENDED_RELEASE_TABLET | Freq: Once | ORAL | Status: AC
  Administered 2015-07-10: 13:00:00 40 meq via ORAL
  Filled 2015-07-10: qty 2

## 2015-07-10 MED ORDER — PREDNISONE 50 MG PO TABS: 50.0000 mg | ORAL_TABLET | Freq: Every day | ORAL | Status: DC

## 2015-07-10 MED ORDER — METOPROLOL TARTRATE 25 MG PO TABS
25.0000 mg | ORAL_TABLET | Freq: Two times a day (BID) | ORAL | Status: DC
  Administered 2015-07-10 (×2): 25 mg via ORAL
  Filled 2015-07-10 (×3): qty 1

## 2015-07-10 MED ORDER — SODIUM CHLORIDE 0.9 % IV SOLN
INTRAVENOUS | Status: DC
  Administered 2015-07-10: 05:00:00 via INTRAVENOUS

## 2015-07-10 NOTE — Consult Note (Signed)
Charles [name] is a 55 y.o. male  [id number]  Primary Cardiologist: Neoma Laming Reason for Consultation: Chest pain  HPI: Mr Boatner was recently hospitalized for chest pain and subsequently cathed and received a DES to the mid LAD. In follow up he was feeling well and tolerating his medications. Last night he  developed a pounding heart beat and fever and chills. His right groin site of cardiac cath is without redness, tenderness, drainage or signs of infection.    Review of Systems: Negative for edema, orthopnea Positive for shortness of breath related to pounding heart beat, positive for chills.   Past Medical History  Diagnosis Date  . Shortness of breath   . Sleep apnea     has cpap-has not used since lost 140lb  . GERD (gastroesophageal reflux disease)   . Peptic ulcer   . Chronic back pain   . Spinal cord stimulator status     has a scs  . Depression   . Anxiety   . Arthritis   . Wears glasses   . Hypothyroidism   . Insomnia   . Low testosterone     Medications Prior to Admission  Medication Sig Dispense Refill  . acidophilus (RISAQUAD) CAPS capsule Take 1 capsule by mouth daily.    Marland Kitchen albuterol (PROVENTIL HFA;VENTOLIN HFA) 108 (90 BASE) MCG/ACT inhaler Inhale 2 puffs into the lungs every 6 (six) hours as needed for wheezing or shortness of breath.     . ALPRAZolam (XANAX) 1 MG tablet Take 1 mg by mouth 3 (three) times daily.     Marland Kitchen aspirin 81 MG chewable tablet Chew 1 tablet (81 mg total) by mouth daily. 30 tablet 0  . atorvastatin (LIPITOR) 40 MG tablet Take 1 tablet (40 mg total) by mouth daily at 6 PM. 30 tablet 0  . busPIRone (BUSPAR) 15 MG tablet Take 30 mg by mouth 2 (two) times daily.     . diclofenac sodium (VOLTAREN) 1 % GEL Apply 2 g topically 4 (four) times daily as needed (for pain).    . DULoxetine (CYMBALTA) 60 MG capsule Take 60 mg by mouth daily.    Marland Kitchen gabapentin (NEURONTIN) 300 MG capsule Take 600 mg by mouth 6 (six) times daily.    Marland Kitchen levothyroxine  (SYNTHROID, LEVOTHROID) 75 MCG tablet Take 75 mcg by mouth daily before breakfast.     . metoprolol tartrate (LOPRESSOR) 25 MG tablet Take 1 tablet (25 mg total) by mouth 2 (two) times daily. 60 tablet 0  . morphine (MS CONTIN) 15 MG 12 hr tablet Take 15-30 mg by mouth every 12 (twelve) hours. Pt takes two tablets in the morning and one at night.    . Multiple Vitamin (MULTIVITAMIN WITH MINERALS) TABS tablet Take 1 tablet by mouth daily.    . nitroGLYCERIN (NITROSTAT) 0.4 MG SL tablet Place 1 tablet (0.4 mg total) under the tongue every 5 (five) minutes as needed for chest pain. Take max 3 tablets for one episode. 30 tablet 0  . omeprazole (PRILOSEC) 20 MG capsule Take 20 mg by mouth daily.     . ondansetron (ZOFRAN-ODT) 4 MG disintegrating tablet Take 4 mg by mouth every 8 (eight) hours as needed for nausea or vomiting.    . Oxycodone HCl 10 MG TABS Take 10 mg by mouth 6 (six) times daily.    Marland Kitchen testosterone cypionate (DEPOTESTOSTERONE CYPIONATE) 200 MG/ML injection Inject 150 mg into the muscle every 14 (fourteen) days.    . ticagrelor (BRILINTA) 90 MG  TABS tablet Take 1 tablet (90 mg total) by mouth 2 (two) times daily. 60 tablet 0  . zolpidem (AMBIEN) 10 MG tablet Take 10 mg by mouth at bedtime.       . ALPRAZolam  1 mg Oral TID  . aspirin  81 mg Oral Daily  . atorvastatin  40 mg Oral q1800  . [START ON 07/11/2015] azithromycin  500 mg Oral Daily  . busPIRone  30 mg Oral BID  . DULoxetine  60 mg Oral Daily  . enoxaparin (LOVENOX) injection  40 mg Subcutaneous Q24H  . levothyroxine  75 mcg Oral QAC breakfast  . metoprolol tartrate  25 mg Oral BID  . morphine  15 mg Oral Q12H  . pantoprazole  40 mg Oral Daily  . predniSONE  50 mg Oral Q breakfast  . sodium chloride flush  3 mL Intravenous Q12H  . ticagrelor  90 mg Oral BID    Infusions: . sodium chloride 50 mL/hr at 07/10/15 0518    Allergies  Allergen Reactions  . Lisinopril Cough    Social History   Social History  .  Marital Status: Married    Spouse Name: N/A  . Number of Children: N/A  . Years of Education: N/A   Occupational History  . Not on file.   Social History Main Topics  . Smoking status: Never Smoker   . Smokeless tobacco: Not on file  . Alcohol Use: No     Comment: not in 4 yr  . Drug Use: Not on file  . Sexual Activity: Not on file   Other Topics Concern  . Not on file   Social History Narrative    Family History  Problem Relation Age of Onset  . Diabetes Father     PHYSICAL EXAM: Filed Vitals:   07/10/15 0937 07/10/15 0944  BP: 104/71 104/71  Pulse:  106  Temp:    Resp:  18     Intake/Output Summary (Last 24 hours) at 07/10/15 1332 Last data filed at 07/10/15 1317  Gross per 24 hour  Intake    240 ml  Output    700 ml  Net   -460 ml    General:  Well appearing. No respiratory difficulty HEENT: normal Neck: supple. no JVD. Carotids 2+ bilat; no bruits. No lymphadenopathy or thryomegaly appreciated. Cor: PMI nondisplaced. Regular rate & rhythm. No rubs, gallops or murmurs. Lungs: clear Abdomen: soft, nontender, nondistended. No hepatosplenomegaly. No bruits or masses. Good bowel sounds. Extremities: no cyanosis, clubbing, rash, edema Neuro: alert & oriented x 3, cranial nerves grossly intact. moves all 4 extremities w/o difficulty. Affect pleasant.  [id number] tachycardia without acute ischemia  Results for orders placed or performed during the hospital encounter of 07/09/15 (from the past 24 hour(s))  CBC     Status: Abnormal   Collection Time: 07/09/15 10:18 PM  Result Value Ref Range   WBC 7.8 3.8 - 10.6 K/uL   RBC 4.75 4.40 - 5.90 MIL/uL   Hemoglobin 15.3 13.0 - 18.0 g/dL   HCT 44.9 40.0 - 52.0 %   MCV 94.5 80.0 - 100.0 fL   MCH 32.2 26.0 - 34.0 pg   MCHC 34.1 32.0 - 36.0 g/dL   RDW 16.3 (H) 11.5 - 14.5 %   Platelets 216 150 - 440 K/uL  Comprehensive metabolic panel     Status: Abnormal   Collection Time: 07/09/15 10:18 PM  Result Value Ref  Range   Sodium 133 (L) 135 - 145 mmol/L  Potassium 3.4 (L) 3.5 - 5.1 mmol/L   Chloride 103 101 - 111 mmol/L   CO2 21 (L) 22 - 32 mmol/L   Glucose, Bld 103 (H) 65 - 99 mg/dL   BUN 12 6 - 20 mg/dL   Creatinine, Ser 0.96 0.61 - 1.24 mg/dL   Calcium 8.4 (L) 8.9 - 10.3 mg/dL   Total Protein 6.7 6.5 - 8.1 g/dL   Albumin 3.9 3.5 - 5.0 g/dL   AST 28 15 - 41 U/L   ALT 20 17 - 63 U/L   Alkaline Phosphatase 100 38 - 126 U/L   Total Bilirubin 1.5 (H) 0.3 - 1.2 mg/dL   GFR calc non Af Amer >60 >60 mL/min   GFR calc Af Amer >60 >60 mL/min   Anion gap 9 5 - 15  Troponin I     Status: None   Collection Time: 07/09/15 10:18 PM  Result Value Ref Range   Troponin I <0.03 <0.031 ng/mL  Rapid Influenza A&B Antigens ([facility] only)     Status: None   Collection Time: 07/09/15 10:18 PM  Result Value Ref Range   Influenza A ([facility]) NEGATIVE NEGATIVE   Influenza B ([facility]) NEGATIVE NEGATIVE  Blood culture (routine x 2)     Status: None (Preliminary result)   Collection Time: 07/09/15 10:37 PM  Result Value Ref Range   Specimen Description BLOOD LEFT ANTECUBITAL    Special Requests BOTTLES DRAWN AEROBIC AND ANAEROBIC 10ML    Culture NO GROWTH < 12 HOURS    Report Status PENDING   Blood culture (routine x 2)     Status: None (Preliminary result)   Collection Time: 07/09/15 10:41 PM  Result Value Ref Range   Specimen Description BLOOD RIGHT HAND    Special Requests BOTTLES DRAWN AEROBIC AND ANAEROBIC 10ML    Culture NO GROWTH < 12 HOURS    Report Status PENDING   Troponin I     Status: None   Collection Time: 07/10/15  2:59 AM  Result Value Ref Range   Troponin I <0.03 <0.031 ng/mL  CBC     Status: Abnormal   Collection Time: 07/10/15  9:35 AM  Result Value Ref Range   WBC 4.7 3.8 - 10.6 K/uL   RBC 4.34 (L) 4.40 - 5.90 MIL/uL   Hemoglobin 14.2 13.0 - 18.0 g/dL   HCT 41.2 40.0 - 52.0 %   MCV 94.8 80.0 - 100.0 fL   MCH 32.8 26.0 - 34.0 pg   MCHC 34.6 32.0 - 36.0 g/dL   RDW 16.2 (H) 11.5 - 14.5 %    Platelets 190 150 - 440 K/uL  Troponin I     Status: None   Collection Time: 07/10/15  9:35 AM  Result Value Ref Range   Troponin I <0.03 <0.031 ng/mL  Basic metabolic panel     Status: Abnormal   Collection Time: 07/10/15  9:35 AM  Result Value Ref Range   Sodium 135 135 - 145 mmol/L   Potassium 3.2 (L) 3.5 - 5.1 mmol/L   Chloride 104 101 - 111 mmol/L   CO2 24 22 - 32 mmol/L   Glucose, Bld 144 (H) 65 - 99 mg/dL   BUN 9 6 - 20 mg/dL   Creatinine, Ser 0.94 0.61 - 1.24 mg/dL   Calcium 8.2 (L) 8.9 - 10.3 mg/dL   GFR calc non Af Amer >60 >60 mL/min   GFR calc Af Amer >60 >60 mL/min   Anion gap 7 5 - 15  Ct Angio Chest Pe W/cm &/or Wo Cm  07/10/2015  CLINICAL DATA:  Chest pain today.  Stent placed in March. EXAM: CT ANGIOGRAPHY CHEST WITH CONTRAST TECHNIQUE: Multidetector CT imaging of the chest was performed using the standard protocol during bolus administration of intravenous contrast. Multiplanar CT image reconstructions and MIPs were obtained to evaluate the vascular anatomy. CONTRAST:  141mL OMNIPAQUE IOHEXOL 350 MG/ML SOLN COMPARISON:  None. FINDINGS: Suboptimal contrast bolus precludes evaluation of segmental and distal pulmonary arteries. However, there is no significant filling defect in the central pulmonary arteries to suggest large pulmonary embolus. Normal heart size. Normal caliber thoracic aorta. Coronary artery stent. Great vessel origins are patent. Esophagus is decompressed. No significant lymphadenopathy in the chest. Evaluation of lungs is limited due to respiratory motion artifact. There are presumed atelectatic changes in the lung bases. No focal consolidation. Peribronchial thickening may represent chronic bronchitis. No pleural effusions. No pneumothorax. Included portions of the upper abdominal organs demonstrate postoperative changes consistent with gastric bypass. Postoperative changes in the thoracolumbar spine with posterior fixation. Bilateral rib deformities  consistent with old fractures. Review of the MIP images confirms the above findings. IMPRESSION: Suboptimal contrast bolus limits evaluation of the pulmonary arteries but no large central pulmonary emboli are visualized. No evidence of active pulmonary disease. Peribronchial thickening suggesting chronic bronchitis. Electronically Signed   By: Lucienne Capers M.D.   On: 07/10/2015 00:03   Dg Chest Portable 1 View  07/09/2015  CLINICAL DATA:  55 year old male with chest pain and shortness of breath EXAM: PORTABLE CHEST 1 VIEW COMPARISON:  Radiograph dated 06/25/2015 FINDINGS: Single-view of the chest demonstrates clear lungs. There is no pleural effusion or pneumothorax. Stable cardiac silhouette. Stable appearing thoracic spine fusion hardware again noted. IMPRESSION: No active disease. Electronically Signed   By: Anner Crete M.D.   On: 07/09/2015 22:58     ASSESSMENT AND PLAN: SYmptomatic tachycardia, chills, fever. Investigate possible source of infection. Consider increasing metoprolol for heart rate to 50 mg bid. COnsider echo to rule out pericarditis.  Daune Perch, NP 07/10/2015 1:32 PM

## 2015-07-10 NOTE — Plan of Care (Signed)
Problem: Pain Managment: Goal: General experience of comfort will improve Outcome: Progressing Anxious  Med for this  Prn morphine  x1  With improvement started neurontin today

## 2015-07-10 NOTE — H&P (Signed)
[facility] [place] NAME: Charles [name]    MR#:  [id number]  DATE OF BIRTH:  05-19-1960  DATE OF ADMISSION:  07/09/2015  PRIMARY CARE PHYSICIAN: Lavera Guise, MD   REQUESTING/REFERRING PHYSICIAN: Owens Shark, MD  CHIEF COMPLAINT:   Chief Complaint  Patient presents with  . Chest Pain    HISTORY OF PRESENT ILLNESS:  Charles [name]  is a 55 y.o. male who presents with Acute episode of chest pain. Patient was at work when his pain started. 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 It was associated with shortness of breath and "cold chills." Patient went home and continued to have more of these as he called them cold chills. He came to the ED for evaluation. When he arrived here he was febrile with a temperature 100.5. CTA chest showed some bronchitis. Cardiac enzyme was initially negative. Hospitalists were called for further evaluation and admission for workup for his chest pain treatment of his bronchitis.  PAST MEDICAL HISTORY:   Past Medical History  Diagnosis Date  . Shortness of breath   . Sleep apnea     has cpap-has not used since lost 140lb  . GERD (gastroesophageal reflux disease)   . Peptic ulcer   . Chronic back pain   . Spinal cord stimulator status     has a scs  . Depression   . Anxiety   . Arthritis   . Wears glasses   . Hypothyroidism   . Insomnia   . Low testosterone     PAST SURGICAL HISTORY:   Past Surgical History  Procedure Laterality Date  . Gastric bypass  2011    has lost 140lb  . Spinal cord stimulator implant  6/13  . Repair tendons foot  2002    rt foot  . Facial fracture surgery      face-upper jaw with dental implants  . Closed reduction nasal fracture  12/22/2011    Procedure: CLOSED REDUCTION NASAL FRACTURE;  Surgeon: Ascencion Dike, MD;  Location: [facility];  Service: ENT;  Laterality: N/A;  closed reduction of nasal  fracture  . Cardiac catheterization N/A 06/27/2015    Procedure: Left Heart Cath and Coronary Angiography;  Surgeon: Dionisio , MD;  Location: [facility];  Service: Cardiovascular;  Laterality: N/A;  . Cardiac catheterization N/A 06/27/2015    Procedure: Coronary Stent Intervention;  Surgeon: Yolonda Kida, MD;  Location: [facility];  Service: Cardiovascular;  Laterality: N/A;    SOCIAL HISTORY:   Social History  Substance Use Topics  . Smoking status: Never Smoker   . Smokeless tobacco: Not on file  . Alcohol Use: No     Comment: not in 4 yr    FAMILY HISTORY:   Family History  Problem Relation Age of Onset  . Diabetes Father     DRUG ALLERGIES:   Allergies  Allergen Reactions  . Lisinopril Cough    MEDICATIONS AT HOME:   Prior to Admission medications   Medication Sig Start Date End Date Taking? Authorizing Provider  acidophilus (RISAQUAD) CAPS capsule Take 1 capsule by mouth daily.   Yes Historical Provider, MD  albuterol (PROVENTIL HFA;VENTOLIN HFA) 108 (90 BASE) MCG/ACT inhaler Inhale 2 puffs into the lungs every 6 (six) hours as needed for wheezing or shortness of breath.    Yes Historical Provider, MD  ALPRAZolam Duanne Moron)  1 MG tablet Take 1 mg by mouth 3 (three) times daily.    Yes Historical Provider, MD  aspirin 81 MG chewable tablet Chew 1 tablet (81 mg total) by mouth daily. 06/28/15  Yes Vaughan Basta, MD  atorvastatin (LIPITOR) 40 MG tablet Take 1 tablet (40 mg total) by mouth daily at 6 PM. 06/28/15  Yes Vaughan Basta, MD  busPIRone (BUSPAR) 15 MG tablet Take 30 mg by mouth 2 (two) times daily.    Yes Historical Provider, MD  diclofenac sodium (VOLTAREN) 1 % GEL Apply 2 g topically 4 (four) times daily as needed (for pain).   Yes Historical Provider, MD  DULoxetine (CYMBALTA) 60 MG capsule Take 60 mg by mouth daily.   Yes Historical Provider, MD  gabapentin (NEURONTIN) 300 MG capsule Take 600 mg by mouth 6 (six) times  daily.   Yes Historical Provider, MD  levothyroxine (SYNTHROID, LEVOTHROID) 75 MCG tablet Take 75 mcg by mouth daily before breakfast.    Yes Historical Provider, MD  metoprolol tartrate (LOPRESSOR) 25 MG tablet Take 1 tablet (25 mg total) by mouth 2 (two) times daily. 06/28/15  Yes Vaughan Basta, MD  morphine (MS CONTIN) 15 MG 12 hr tablet Take 15-30 mg by mouth every 12 (twelve) hours. Pt takes two tablets in the morning and one at night.   Yes Historical Provider, MD  Multiple Vitamin (MULTIVITAMIN WITH MINERALS) TABS tablet Take 1 tablet by mouth daily.   Yes Historical Provider, MD  nitroGLYCERIN (NITROSTAT) 0.4 MG SL tablet Place 1 tablet (0.4 mg total) under the tongue every 5 (five) minutes as needed for chest pain. Take max 3 tablets for one episode. 06/28/15  Yes Vaughan Basta, MD  omeprazole (PRILOSEC) 20 MG capsule Take 20 mg by mouth daily.    Yes Historical Provider, MD  ondansetron (ZOFRAN-ODT) 4 MG disintegrating tablet Take 4 mg by mouth every 8 (eight) hours as needed for nausea or vomiting.   Yes Historical Provider, MD  Oxycodone HCl 10 MG TABS Take 10 mg by mouth 6 (six) times daily.   Yes Historical Provider, MD  testosterone cypionate (DEPOTESTOSTERONE CYPIONATE) 200 MG/ML injection Inject 150 mg into the muscle every 14 (fourteen) days.   Yes Historical Provider, MD  ticagrelor (BRILINTA) 90 MG TABS tablet Take 1 tablet (90 mg total) by mouth 2 (two) times daily. 06/28/15  Yes Vaughan Basta, MD  zolpidem (AMBIEN) 10 MG tablet Take 10 mg by mouth at bedtime.   Yes Historical Provider, MD    REVIEW OF SYSTEMS:  Review of Systems  Constitutional: Positive for fever and chills. Negative for weight loss and malaise/fatigue.  HENT: Negative for ear pain, hearing loss and tinnitus.   Eyes: Negative for blurred vision, double vision, pain and redness.  Respiratory: Negative for cough, hemoptysis and shortness of breath.   Cardiovascular: Positive for chest  pain. Negative for palpitations, orthopnea and leg swelling.  Gastrointestinal: Negative for nausea, vomiting, abdominal pain, diarrhea and constipation.  Genitourinary: Negative for dysuria, frequency and hematuria.  Musculoskeletal: Negative for back pain, joint pain and neck pain.  Skin:       No acne, rash, or lesions  Neurological: Negative for dizziness, tremors, focal weakness and weakness.  Endo/Heme/Allergies: Negative for polydipsia. Does not bruise/bleed easily.  Psychiatric/Behavioral: Negative for depression. The patient is not nervous/anxious and does not have insomnia.      VITAL SIGNS:   Filed Vitals:   07/09/15 2300 07/09/15 2323 07/09/15 2330 07/10/15 0100  BP: 125/64  103/61  112/68  Pulse: 113  115 109  Temp:  99.6 F (37.6 C)    TempSrc:      Resp:   13 15  Height:      Weight:      SpO2: 91%  95% 96%   Wt Readings from Last 3 Encounters:  07/09/15 138.801 kg (306 lb)  06/25/15 139 kg (306 lb 7 oz)  09/14/14 136.079 kg (300 lb)    PHYSICAL EXAMINATION:  Physical Exam  Vitals reviewed. Constitutional: He is oriented to person, place, and time. He appears well-developed and well-nourished. No distress.  HENT:  Head: Normocephalic and atraumatic.  Mouth/Throat: Oropharynx is clear and moist.  Eyes: Conjunctivae and EOM are normal. Pupils are equal, round, and reactive to light. No scleral icterus.  Neck: Normal range of motion. Neck supple. No JVD present. No thyromegaly present.  Cardiovascular: Regular rhythm and intact distal pulses.  Exam reveals no gallop and no friction rub.   No murmur heard. Tachycardic  Respiratory: Effort normal and breath sounds normal. No respiratory distress. He has no wheezes. He has no rales.  GI: Soft. Bowel sounds are normal. He exhibits no distension. There is no tenderness.  Musculoskeletal: Normal range of motion. He exhibits no edema.  No arthritis, no gout  Lymphadenopathy:    He has no cervical adenopathy.   Neurological: He is alert and oriented to person, place, and time. No cranial nerve deficit.  No dysarthria, no aphasia  Skin: Skin is warm and dry. No rash noted. No erythema.  Psychiatric: He has a normal mood and affect. His behavior is normal. Judgment and thought content normal.    LABORATORY PANEL:   CBC  Recent Labs Lab 07/09/15 2218  WBC 7.8  HGB 15.3  HCT 44.9  PLT 216   ------------------------------------------------------------------------------------------------------------------  Chemistries   Recent Labs Lab 07/09/15 2218  NA 133*  K 3.4*  CL 103  CO2 21*  GLUCOSE 103*  BUN 12  CREATININE 0.96  CALCIUM 8.4*  AST 28  ALT 20  ALKPHOS 100  BILITOT 1.5*   ------------------------------------------------------------------------------------------------------------------  Cardiac Enzymes  Recent Labs Lab 07/09/15 2218  TROPONINI <0.03   ------------------------------------------------------------------------------------------------------------------  RADIOLOGY:  Ct Angio Chest Pe W/cm &/or Wo Cm  07/10/2015  CLINICAL DATA:  Chest pain today.  Stent placed in March. EXAM: CT ANGIOGRAPHY CHEST WITH CONTRAST TECHNIQUE: Multidetector CT imaging of the chest was performed using the standard protocol during bolus administration of intravenous contrast. Multiplanar CT image reconstructions and MIPs were obtained to evaluate the vascular anatomy. CONTRAST:  164mL OMNIPAQUE IOHEXOL 350 MG/ML SOLN COMPARISON:  None. FINDINGS: Suboptimal contrast bolus precludes evaluation of segmental and distal pulmonary arteries. However, there is no significant filling defect in the central pulmonary arteries to suggest large pulmonary embolus. Normal heart size. Normal caliber thoracic aorta. Coronary artery stent. Great vessel origins are patent. Esophagus is decompressed. No significant lymphadenopathy in the chest. Evaluation of lungs is limited due to respiratory motion  artifact. There are presumed atelectatic changes in the lung bases. No focal consolidation. Peribronchial thickening may represent chronic bronchitis. No pleural effusions. No pneumothorax. Included portions of the upper abdominal organs demonstrate postoperative changes consistent with gastric bypass. Postoperative changes in the thoracolumbar spine with posterior fixation. Bilateral rib deformities consistent with old fractures. Review of the MIP images confirms the above findings. IMPRESSION: Suboptimal contrast bolus limits evaluation of the pulmonary arteries but no large central pulmonary emboli are visualized. No evidence of active pulmonary disease. Peribronchial thickening suggesting  chronic bronchitis. Electronically Signed   By: Lucienne Capers M.D.   On: 07/10/2015 00:03   Dg Chest Portable 1 View  07/09/2015  CLINICAL DATA:  55 year old male with chest pain and shortness of breath EXAM: PORTABLE CHEST 1 VIEW COMPARISON:  Radiograph dated 06/25/2015 FINDINGS: Single-view of the chest demonstrates clear lungs. There is no pleural effusion or pneumothorax. Stable cardiac silhouette. Stable appearing thoracic spine fusion hardware again noted. IMPRESSION: No active disease. Electronically Signed   By: Anner Crete M.D.   On: 07/09/2015 22:58    EKG:   Orders placed or performed during the hospital encounter of 07/09/15  . ED EKG  . ED EKG  . EKG 12-Lead  . EKG 12-Lead    IMPRESSION AND PLAN:  Principal Problem:   Chest pain - first set of cardiac enzymes negative. EKG without significant ischemic changes. Patient's description of his chest pain is fairly typical for cardiac pain. He does have a previous cardiac history. We will admit him with telemetry, trend his enzymes, get an echocardiogram and cardiology consult. Active Problems:   Chronic bronchitis ([place]) - suggested on CT scan. Given his fever we will treat him with azithromycin for bronchitis.   Anxiety - continue home  anxiolytics   HTN (hypertension) - continue home meds   GERD (gastroesophageal reflux disease) - home dose PPI  All the records are reviewed and case discussed with ED provider. Management plans discussed with the patient and/or family.  DVT PROPHYLAXIS: SubQ lovenox  GI PROPHYLAXIS: PPI  ADMISSION STATUS: Observation  CODE STATUS: Full Code Status History    Date Active Date Inactive Code Status Order ID Comments User Context   06/27/2015  1:59 PM 06/28/2015 12:13 PM Full Code [id number]  Yolonda Kida, MD Inpatient   06/25/2015  8:10 PM 06/27/2015  1:59 PM Full Code [id number]  Vaughan Basta, MD Inpatient      TOTAL TIME TAKING CARE OF THIS PATIENT: 40 minutes.    Albert Devaul Barnett 07/10/2015, 2:14 AM  [facility]  Office  [phone]  CC: Primary care physician; Lavera Guise, MD

## 2015-07-11 ENCOUNTER — Observation Stay
Admit: 2015-07-11 | Discharge: 2015-07-11 | Disposition: A | Discharge status: Home / Self Care | Payer: BLUE CROSS/BLUE SHIELD | Attending: Cardiology | Admitting: Cardiology

## 2015-07-11 ENCOUNTER — Observation Stay: Admit: 2015-07-11 | Payer: BLUE CROSS/BLUE SHIELD

## 2015-07-11 LAB — ECHOCARDIOGRAM COMPLETE
HEIGHTINCHES: 74 in
WEIGHTICAEL: 5003.2 [oz_av]

## 2015-07-11 MED ORDER — PERFLUTREN LIPID MICROSPHERE
INTRAVENOUS | Status: AC
  Administered 2015-07-11: 08:00:00
  Filled 2015-07-11: qty 10

## 2015-07-11 MED ORDER — ALBUTEROL SULFATE (2.5 MG/3ML) 0.083% IN NEBU
3.0000 mL | INHALATION_SOLUTION | Freq: Four times a day (QID) | RESPIRATORY_TRACT | Status: DC | PRN
  Administered 2015-07-11: 3 mL via RESPIRATORY_TRACT
  Filled 2015-07-11: qty 3

## 2015-07-11 MED ORDER — SODIUM CHLORIDE FLUSH 0.9 % IV SOLN
INTRAVENOUS | Status: AC
  Filled 2015-07-11: qty 20

## 2015-07-11 MED ORDER — METOPROLOL TARTRATE 50 MG PO TABS
50.0000 mg | ORAL_TABLET | Freq: Two times a day (BID) | ORAL | Status: DC
  Administered 2015-07-11: 09:00:00 50 mg via ORAL

## 2015-07-11 MED ORDER — METOPROLOL TARTRATE 50 MG PO TABS
50.0000 mg | ORAL_TABLET | Freq: Two times a day (BID) | ORAL | Status: DC
Start: 2015-07-11 — End: 2016-06-25

## 2015-07-11 MED ORDER — ALBUTEROL SULFATE HFA 108 (90 BASE) MCG/ACT IN AERS: 2.0000 | INHALATION_SPRAY | Freq: Four times a day (QID) | RESPIRATORY_TRACT | Status: DC | PRN

## 2015-07-11 NOTE — Discharge Summary (Signed)
[facility] [place] NAME: Charles [name]    MR#:  [id number]  DATE OF BIRTH:  Nov 11, 1960  DATE OF ADMISSION:  07/09/2015 ADMITTING PHYSICIAN: Lance Coon, MD  DATE OF DISCHARGE: 07/11/2015  1:56 PM  PRIMARY CARE PHYSICIAN: Lavera Guise, MD   ADMISSION DIAGNOSIS:  Chest pain, unspecified chest pain type [R07.9]  DISCHARGE DIAGNOSIS:  Principal Problem:   Chest pain Active Problems:   Chronic bronchitis (HCC)   GERD (gastroesophageal reflux disease)   Anxiety   HTN (hypertension)   SECONDARY DIAGNOSIS:   Past Medical History  Diagnosis Date  . Shortness of breath   . Sleep apnea     has cpap-has not used since lost 140lb  . GERD (gastroesophageal reflux disease)   . Peptic ulcer   . Chronic back pain   . Spinal cord stimulator status     has a scs  . Depression   . Anxiety   . Arthritis   . Wears glasses   . Hypothyroidism   . Insomnia   . Low testosterone      ADMITTING HISTORY  Charles [name] is a 55 y.o. male who presents with Acute episode of chest pain. Patient was at work when his pain started. 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 It was associated with shortness of breath and "cold chills." Patient went home and continued to have more of these as he called them cold chills. He came to the ED for evaluation. When he arrived here he was febrile with a temperature 100.5. CTA chest showed some bronchitis. Cardiac enzyme was initially negative. Hospitalists were called for further evaluation and admission for workup for his chest pain treatment of his bronchitis.  HOSPITAL COURSE:   Patient was admitted onto medical floor with telemetry to rule out acute coronary syndrome. Patient's troponin 3 was normal. EKG showed no acute changes. Echocardiogram was done which showed no wall motion abnormalities or pericardial effusion or significant bowel  problems. Was seen by Dr. Humphrey Rolls of cardiology who advised outpatient follow-up. Metoprolol increased from 25 mg twice a day to 50 mg twice a day.  Patient had mild wheezing on expiration with cough and was started on prednisone and albuterol inhaler with a chief feels significantly better. His chest pain was most likely due to acute bronchitis. He has been given albuterol prescription along with prednisone prescription and azithromycin at discharge.  Stable for discharge and follow-up with primary care physician or Dr. Humphrey Rolls of cardiology.   CONSULTS OBTAINED:  Treatment Team:  Dionisio David, MD  DRUG ALLERGIES:   Allergies  Allergen Reactions  . Lisinopril Cough    DISCHARGE MEDICATIONS:   Discharge Medication List as of 07/11/2015  1:05 PM    START taking these medications   Details  !! albuterol (PROVENTIL HFA;VENTOLIN HFA) 108 (90 Base) MCG/ACT inhaler Inhale 2 puffs into the lungs every 6 (six) hours as needed for wheezing or shortness of breath., Starting 07/11/2015, Until Discontinued, Normal    azithromycin (ZITHROMAX) 250 MG tablet Take 1 tablet (250 mg total) by mouth daily., Starting 07/10/2015, Until Discontinued, Print    predniSONE (DELTASONE) 50 MG tablet Take 1 tablet (50 mg total) by mouth daily with breakfast., Starting 07/11/2015, Until Discontinued, Print     !! - Potential duplicate medications found. Please discuss with provider.    CONTINUE these medications which have CHANGED   Details  metoprolol tartrate (LOPRESSOR) 50 MG tablet Take 1 tablet (50 mg total) by mouth 2 (two) times daily., Starting 07/11/2015, Until Discontinued, Print      CONTINUE these medications which have NOT CHANGED   Details  acidophilus (RISAQUAD) CAPS capsule Take 1 capsule by mouth daily., Until Discontinued, Historical Med    !! albuterol (PROVENTIL HFA;VENTOLIN HFA) 108 (90 BASE) MCG/ACT inhaler Inhale 2 puffs into the lungs every 6 (six) hours as needed for wheezing or shortness  of breath. , Until Discontinued, Historical Med    ALPRAZolam (XANAX) 1 MG tablet Take 1 mg by mouth 3 (three) times daily. , Until Discontinued, Historical Med    aspirin 81 MG chewable tablet Chew 1 tablet (81 mg total) by mouth daily., Starting 06/28/2015, Until Discontinued, Print    atorvastatin (LIPITOR) 40 MG tablet Take 1 tablet (40 mg total) by mouth daily at 6 PM., Starting 06/28/2015, Until Discontinued, Print    busPIRone (BUSPAR) 15 MG tablet Take 30 mg by mouth 2 (two) times daily. , Until Discontinued, Historical Med    diclofenac sodium (VOLTAREN) 1 % GEL Apply 2 g topically 4 (four) times daily as needed (for pain)., Until Discontinued, Historical Med    DULoxetine (CYMBALTA) 60 MG capsule Take 60 mg by mouth daily., Until Discontinued, Historical Med    gabapentin (NEURONTIN) 300 MG capsule Take 600 mg by mouth 6 (six) times daily., Until Discontinued, Historical Med    levothyroxine (SYNTHROID, LEVOTHROID) 75 MCG tablet Take 75 mcg by mouth daily before breakfast. , Until Discontinued, Historical Med    morphine (MS CONTIN) 15 MG 12 hr tablet Take 15-30 mg by mouth every 12 (twelve) hours. Pt takes two tablets in the morning and one at night., Until Discontinued, Historical Med    Multiple Vitamin (MULTIVITAMIN WITH MINERALS) TABS tablet Take 1 tablet by mouth daily., Until Discontinued, Historical Med    nitroGLYCERIN (NITROSTAT) 0.4 MG SL tablet Place 1 tablet (0.4 mg total) under the tongue every 5 (five) minutes as needed for chest pain. Take max 3 tablets for one episode., Starting 06/28/2015, Until Discontinued, Print    omeprazole (PRILOSEC) 20 MG capsule Take 20 mg by mouth daily. , Until Discontinued, Historical Med    ondansetron (ZOFRAN-ODT) 4 MG disintegrating tablet Take 4 mg by mouth every 8 (eight) hours as needed for nausea or vomiting., Until Discontinued, Historical Med    Oxycodone HCl 10 MG TABS Take 10 mg by mouth 6 (six) times daily., Until  Discontinued, Historical Med    testosterone cypionate (DEPOTESTOSTERONE CYPIONATE) 200 MG/ML injection Inject 150 mg into the muscle every 14 (fourteen) days., Until Discontinued, Historical Med    ticagrelor (BRILINTA) 90 MG TABS tablet Take 1 tablet (90 mg total) by mouth 2 (two) times daily., Starting 06/28/2015, Until Discontinued, Print    zolpidem (AMBIEN) 10 MG tablet Take 10 mg by mouth at bedtime., Until Discontinued, Historical Med     !! - Potential duplicate medications found. Please discuss with provider.      Today   VITAL SIGNS:  Blood pressure 134/76, pulse 85, temperature 98.5 F (36.9 C), temperature source Oral, resp. rate 18, height 6\' 2"  (1.88 m), weight 141.84 kg (312 lb 11.2 oz), SpO2 95 %.  I/O:   Intake/Output Summary (Last 24 hours) at 07/11/15 1443 Last data filed at 07/11/15 0900  Gross per 24 hour  Intake    608 ml  Output   2200 ml  Net  -1592 ml    PHYSICAL  EXAMINATION:  Physical Exam  GENERAL:  55 y.o.-year-old patient lying in the bed with no acute distress.  LUNGS: Normal breath sounds bilaterally, no wheezing, rales,rhonchi or crepitation. No use of accessory muscles of respiration.  CARDIOVASCULAR: S1, S2 normal. No murmurs, rubs, or gallops.  ABDOMEN: Soft, non-tender, non-distended. Bowel sounds present. No organomegaly or mass.  NEUROLOGIC: Moves all 4 extremities. PSYCHIATRIC: The patient is alert and oriented x 3.  SKIN: No obvious rash, lesion, or ulcer.   DATA REVIEW:   CBC  Recent Labs Lab 07/10/15 0935  WBC 4.7  HGB 14.2  HCT 41.2  PLT 190    Chemistries   Recent Labs Lab 07/09/15 2218 07/10/15 0935  NA 133* 135  K 3.4* 3.2*  CL 103 104  CO2 21* 24  GLUCOSE 103* 144*  BUN 12 9  CREATININE 0.96 0.94  CALCIUM 8.4* 8.2*  AST 28  --   ALT 20  --   ALKPHOS 100  --   BILITOT 1.5*  --     Cardiac Enzymes  Recent Labs Lab 07/10/15 2112  TROPONINI <0.03    Microbiology Results  Results for orders  placed or performed during the hospital encounter of 07/09/15  Rapid Influenza A&B Antigens ([facility] only)     Status: None   Collection Time: 07/09/15 10:18 PM  Result Value Ref Range Status   Influenza A ([facility]) NEGATIVE NEGATIVE Final   Influenza B ([facility]) NEGATIVE NEGATIVE Final  Blood culture (routine x 2)     Status: None (Preliminary result)   Collection Time: 07/09/15 10:37 PM  Result Value Ref Range Status   Specimen Description BLOOD LEFT ANTECUBITAL  Final   Special Requests BOTTLES DRAWN AEROBIC AND ANAEROBIC 10ML  Final   Culture NO GROWTH 2 DAYS  Final   Report Status PENDING  Incomplete  Blood culture (routine x 2)     Status: None (Preliminary result)   Collection Time: 07/09/15 10:41 PM  Result Value Ref Range Status   Specimen Description BLOOD RIGHT HAND  Final   Special Requests BOTTLES DRAWN AEROBIC AND ANAEROBIC 10ML  Final   Culture NO GROWTH 2 DAYS  Final   Report Status PENDING  Incomplete    RADIOLOGY:  Ct Angio Chest Pe W/cm &/or Wo Cm  07/10/2015  CLINICAL DATA:  Chest pain today.  Stent placed in March. EXAM: CT ANGIOGRAPHY CHEST WITH CONTRAST TECHNIQUE: Multidetector CT imaging of the chest was performed using the standard protocol during bolus administration of intravenous contrast. Multiplanar CT image reconstructions and MIPs were obtained to evaluate the vascular anatomy. CONTRAST:  161mL OMNIPAQUE IOHEXOL 350 MG/ML SOLN COMPARISON:  None. FINDINGS: Suboptimal contrast bolus precludes evaluation of segmental and distal pulmonary arteries. However, there is no significant filling defect in the central pulmonary arteries to suggest large pulmonary embolus. Normal heart size. Normal caliber thoracic aorta. Coronary artery stent. Great vessel origins are patent. Esophagus is decompressed. No significant lymphadenopathy in the chest. Evaluation of lungs is limited due to respiratory motion artifact. There are presumed atelectatic changes in the lung bases. No focal  consolidation. Peribronchial thickening may represent chronic bronchitis. No pleural effusions. No pneumothorax. Included portions of the upper abdominal organs demonstrate postoperative changes consistent with gastric bypass. Postoperative changes in the thoracolumbar spine with posterior fixation. Bilateral rib deformities consistent with old fractures. Review of the MIP images confirms the above findings. IMPRESSION: Suboptimal contrast bolus limits evaluation of the pulmonary arteries but no large central pulmonary emboli are visualized. No evidence  of active pulmonary disease. Peribronchial thickening suggesting chronic bronchitis. Electronically Signed   By: Lucienne Capers M.D.   On: 07/10/2015 00:03   Dg Chest Portable 1 View  07/09/2015  CLINICAL DATA:  55 year old male with chest pain and shortness of breath EXAM: PORTABLE CHEST 1 VIEW COMPARISON:  Radiograph dated 06/25/2015 FINDINGS: Single-view of the chest demonstrates clear lungs. There is no pleural effusion or pneumothorax. Stable cardiac silhouette. Stable appearing thoracic spine fusion hardware again noted. IMPRESSION: No active disease. Electronically Signed   By: Anner Crete M.D.   On: 07/09/2015 22:58    Follow up with PCP in 1 week.  Management plans discussed with the patient, family and they are in agreement.  CODE STATUS:     Code Status Orders        Start     Ordered   07/10/15 0501  Full code   Continuous     07/10/15 0500    Code Status History    Date Active Date Inactive Code Status Order ID Comments User Context   06/27/2015  1:59 PM 06/28/2015 12:13 PM Full Code [id number]  Yolonda Kida, MD Inpatient   06/25/2015  8:10 PM 06/27/2015  1:59 PM Full Code [id number]  Vaughan Basta, MD Inpatient      TOTAL TIME TAKING CARE OF THIS PATIENT ON DAY OF DISCHARGE: more than 30 minutes.   Hillary Bow R M.D on 07/11/2015 at 2:43 PM  Between 7am to 6pm - Pager - [phone]  After 6pm go to  [email or web address] - password [email or web address] [facility]  Office  [phone]  CC: Primary care physician; Lavera Guise, MD  Note: This dictation was prepared with Dragon dictation along with smaller phrase technology. Any transcriptional errors that result from this process are unintentional.

## 2015-07-11 NOTE — Progress Notes (Signed)
SUBJECTIVE: Patient was admitted to the hospital with palpitations and chest pain which appears to be atypical but had low-grade temperature also.   Filed Vitals:   07/10/15 0937 07/10/15 0944 07/10/15 2054 07/11/15 0526  BP: 104/71 104/71 119/60 105/66  Pulse:  106 102 80  Temp:   98.6 F (37 C) 98.5 F (36.9 C)  TempSrc:   Oral Oral  Resp:  18 20 18   Height:      Weight:      SpO2:   94% 95%    Intake/Output Summary (Last 24 hours) at 07/11/15 0832 Last data filed at 07/11/15 0557  Gross per 24 hour  Intake    605 ml  Output   2700 ml  Net  -2095 ml    LABS: Basic Metabolic Panel:  Recent Labs  07/09/15 2218 07/10/15 0935  NA 133* 135  K 3.4* 3.2*  CL 103 104  CO2 21* 24  GLUCOSE 103* 144*  BUN 12 9  CREATININE 0.96 0.94  CALCIUM 8.4* 8.2*   Liver Function Tests:  Recent Labs  07/09/15 2218  AST 28  ALT 20  ALKPHOS 100  BILITOT 1.5*  PROT 6.7  ALBUMIN 3.9   No results for input(s): LIPASE, AMYLASE in the last 72 hours. CBC:  Recent Labs  07/09/15 2218 07/10/15 0935  WBC 7.8 4.7  HGB 15.3 14.2  HCT 44.9 41.2  MCV 94.5 94.8  PLT 216 190   Cardiac Enzymes:  Recent Labs  07/10/15 0935 07/10/15 1517 07/10/15 2112  TROPONINI <0.03 <0.03 <0.03   BNP: Invalid input(s): POCBNP D-Dimer: No results for input(s): DDIMER in the last 72 hours. Hemoglobin A1C: No results for input(s): HGBA1C in the last 72 hours. Fasting Lipid Panel: No results for input(s): CHOL, HDL, LDLCALC, TRIG, CHOLHDL, LDLDIRECT in the last 72 hours. Thyroid Function Tests: No results for input(s): TSH, T4TOTAL, T3FREE, THYROIDAB in the last 72 hours.  Invalid input(s): FREET3 Anemia Panel: No results for input(s): VITAMINB12, FOLATE, FERRITIN, TIBC, IRON, RETICCTPCT in the last 72 hours.   PHYSICAL EXAM General: Well developed, well nourished, in no acute distress HEENT:  Normocephalic and atramatic Neck:  No JVD.  Lungs: Clear bilaterally to auscultation and  percussion. Heart: HRRR . Normal S1 and S2 without gallops or murmurs.  Abdomen: Bowel sounds are positive, abdomen soft and non-tender  Msk:  Back normal, normal gait. Normal strength and tone for age. Extremities: No clubbing, cyanosis or edema.   Neuro: Alert and oriented X 3. Psych:  Good affect, responds appropriately  TELEMETRY:Sinus rhythm, EKG shows sinus tachycardia with changes  ASSESSMENT AND PLAN: Atypical chest pain with sinus tachycardia associated with low grade temperature. Patient had a stent in the mid LAD deployed last week . He had 70-80% lesion in mid LAD successfully treated with drug-eluting stent. He ruled out for myocardial infarction and EKG only shows sinus tachycardia no acute changes. Echocardiogram done today showed left ventricular ejection fraction 52% with mild pericardial effusion. He may have pericarditis as the cause of low-grade fever and chest pain and palpitation. Advise metoprolol 50 mg twice a day. He may go home with follow-up in the office next week on Tuesday at 2 PM.  Principal Problem:   Chest pain Active Problems:   Chronic bronchitis (HCC)   GERD (gastroesophageal reflux disease)   Anxiety   HTN (hypertension)    Dionisio David, MD, [facility] 07/11/2015 8:32 AM

## 2015-07-11 NOTE — Discharge Instructions (Signed)

## 2015-07-11 NOTE — Progress Notes (Signed)
Patient discharged home per MD orders. Prescriptions given to patient. All discharge instructions given and all questions answered. Escorted by friend and aux.

## 2015-07-11 NOTE — Progress Notes (Signed)
*  PRELIMINARY RESULTS* Echocardiogram 2D Echocardiogram has been performed.  Charles [name] 07/11/2015, 7:53 AM

## 2015-07-24 DIAGNOSIS — Z79891 Long term (current) use of opiate analgesic: Secondary | ICD-10-CM | POA: Diagnosis not present

## 2015-07-24 DIAGNOSIS — G4733 Obstructive sleep apnea (adult) (pediatric): Secondary | ICD-10-CM | POA: Diagnosis not present

## 2015-07-24 DIAGNOSIS — G890 Central pain syndrome: Secondary | ICD-10-CM | POA: Diagnosis not present

## 2015-07-25 ENCOUNTER — Ambulatory Visit: Payer: BLUE CROSS/BLUE SHIELD

## 2015-07-31 ENCOUNTER — Emergency Department: Payer: BLUE CROSS/BLUE SHIELD

## 2015-07-31 ENCOUNTER — Emergency Department
Admission: EM | Admit: 2015-07-31 | Discharge: 2015-07-31 | Disposition: A | Discharge status: Home / Self Care | Payer: BLUE CROSS/BLUE SHIELD | Attending: Emergency Medicine | Admitting: Emergency Medicine

## 2015-07-31 ENCOUNTER — Encounter: Payer: Self-pay | Admitting: *Deleted

## 2015-07-31 DIAGNOSIS — G8929 Other chronic pain: Secondary | ICD-10-CM | POA: Diagnosis not present

## 2015-07-31 DIAGNOSIS — Z79891 Long term (current) use of opiate analgesic: Secondary | ICD-10-CM | POA: Insufficient documentation

## 2015-07-31 DIAGNOSIS — Z79899 Other long term (current) drug therapy: Secondary | ICD-10-CM | POA: Insufficient documentation

## 2015-07-31 DIAGNOSIS — I209 Angina pectoris, unspecified: Secondary | ICD-10-CM | POA: Diagnosis not present

## 2015-07-31 DIAGNOSIS — J42 Unspecified chronic bronchitis: Secondary | ICD-10-CM | POA: Diagnosis not present

## 2015-07-31 DIAGNOSIS — F329 Major depressive disorder, single episode, unspecified: Secondary | ICD-10-CM | POA: Diagnosis not present

## 2015-07-31 DIAGNOSIS — Z7952 Long term (current) use of systemic steroids: Secondary | ICD-10-CM | POA: Diagnosis not present

## 2015-07-31 DIAGNOSIS — E039 Hypothyroidism, unspecified: Secondary | ICD-10-CM | POA: Insufficient documentation

## 2015-07-31 DIAGNOSIS — Z7982 Long term (current) use of aspirin: Secondary | ICD-10-CM | POA: Insufficient documentation

## 2015-07-31 DIAGNOSIS — M549 Dorsalgia, unspecified: Secondary | ICD-10-CM | POA: Diagnosis not present

## 2015-07-31 DIAGNOSIS — I10 Essential (primary) hypertension: Secondary | ICD-10-CM | POA: Insufficient documentation

## 2015-07-31 DIAGNOSIS — Z792 Long term (current) use of antibiotics: Secondary | ICD-10-CM | POA: Diagnosis not present

## 2015-07-31 DIAGNOSIS — R0789 Other chest pain: Secondary | ICD-10-CM | POA: Diagnosis not present

## 2015-07-31 DIAGNOSIS — I2584 Coronary atherosclerosis due to calcified coronary lesion: Secondary | ICD-10-CM | POA: Diagnosis not present

## 2015-07-31 DIAGNOSIS — R079 Chest pain, unspecified: Secondary | ICD-10-CM | POA: Diagnosis not present

## 2015-07-31 LAB — BASIC METABOLIC PANEL
ANION GAP: 4 — AB (ref 5–15)
BUN: 8 mg/dL (ref 6–20)
CALCIUM: 8.7 mg/dL — AB (ref 8.9–10.3)
CO2: 27 mmol/L (ref 22–32)
CREATININE: 0.96 mg/dL (ref 0.61–1.24)
Chloride: 106 mmol/L (ref 101–111)
Glucose, Bld: 124 mg/dL — ABNORMAL HIGH (ref 65–99)
Potassium: 4.1 mmol/L (ref 3.5–5.1)
SODIUM: 137 mmol/L (ref 135–145)

## 2015-07-31 LAB — CBC
HCT: 48.6 % (ref 40.0–52.0)
HEMOGLOBIN: 16.2 g/dL (ref 13.0–18.0)
MCH: 31.7 pg (ref 26.0–34.0)
MCHC: 33.4 g/dL (ref 32.0–36.0)
MCV: 94.8 fL (ref 80.0–100.0)
PLATELETS: 287 10*3/uL (ref 150–440)
RBC: 5.12 MIL/uL (ref 4.40–5.90)
RDW: 15.5 % — ABNORMAL HIGH (ref 11.5–14.5)
WBC: 10.1 10*3/uL (ref 3.8–10.6)

## 2015-07-31 LAB — TROPONIN I

## 2015-07-31 MED ORDER — MORPHINE SULFATE (PF) 4 MG/ML IV SOLN
4.0000 mg | Freq: Once | INTRAVENOUS | Status: AC
  Administered 2015-07-31: 4 mg via INTRAVENOUS
  Filled 2015-07-31: qty 1

## 2015-07-31 MED ORDER — ONDANSETRON HCL 4 MG/2ML IJ SOLN
4.0000 mg | Freq: Once | INTRAMUSCULAR | Status: AC
  Administered 2015-07-31: 4 mg via INTRAVENOUS
  Filled 2015-07-31: qty 2

## 2015-07-31 MED ORDER — ASPIRIN 81 MG PO CHEW
324.0000 mg | CHEWABLE_TABLET | Freq: Once | ORAL | Status: AC
  Administered 2015-07-31: 324 mg via ORAL
  Filled 2015-07-31: qty 4

## 2015-07-31 NOTE — Discharge Instructions (Signed)

## 2015-07-31 NOTE — ED Provider Notes (Signed)
[facility] Emergency Department Provider Note  ____________________________________________  Time seen: Approximately 2:21 PM  I have reviewed the triage vital signs and the nursing notes.   HISTORY  Chief Complaint Chest Pain    HPI Charles [name] is a 55 y.o. male with history of CAD status post stent placed about a month ago.  His cardiologist is Dr. Humphrey Rolls.  He has had another recent ED visit for chest pain, about 3 weeks ago, that was similar to the current presentation.  He reports the chest pain began acutely last night, is severe, and located in his anterior chest.  It is a sensation of chest pressure and tightness and accompanied by mild shortness of breath.  He also states that his shortness of breath gets worse with exertion.  He took 2 of his nitroglycerin last night and then another one today which helped somewhat.  He went to his primary care doctor today who told him to go to the emergency department for further evaluation.   Past Medical History  Diagnosis Date  . Shortness of breath   . Sleep apnea     has cpap-has not used since lost 140lb  . GERD (gastroesophageal reflux disease)   . Peptic ulcer   . Chronic back pain   . Spinal cord stimulator status     has a scs  . Depression   . Anxiety   . Arthritis   . Wears glasses   . Hypothyroidism   . Insomnia   . Low testosterone     Patient Active Problem List   Diagnosis Date Noted  . Chronic bronchitis ([place]) 07/10/2015  . GERD (gastroesophageal reflux disease) 07/10/2015  . Anxiety 07/10/2015  . HTN (hypertension) 07/10/2015  . Chest pain 06/25/2015    Past Surgical History  Procedure Laterality Date  . Gastric bypass  2011    has lost 140lb  . Spinal cord stimulator implant  6/13  . Repair tendons foot  2002    rt foot  . Facial fracture surgery      face-upper jaw with dental implants  . Closed reduction nasal fracture  12/22/2011    Procedure: CLOSED REDUCTION NASAL  FRACTURE;  Surgeon: Ascencion Dike, MD;  Location: [place];  Service: ENT;  Laterality: N/A;  closed reduction of nasal fracture  . Cardiac catheterization N/A 06/27/2015    Procedure: Left Heart Cath and Coronary Angiography;  Surgeon: Dionisio David, MD;  Location: [place] [facility];  Service: Cardiovascular;  Laterality: N/A;  . Cardiac catheterization N/A 06/27/2015    Procedure: Coronary Stent Intervention;  Surgeon: Yolonda Kida, MD;  Location: [place] [facility];  Service: Cardiovascular;  Laterality: N/A;    Current Outpatient Rx  Name  Route  Sig  Dispense  Refill  . acidophilus (RISAQUAD) CAPS capsule   Oral   Take 1 capsule by mouth daily.         Marland Kitchen albuterol (PROVENTIL HFA;VENTOLIN HFA) 108 (90 BASE) MCG/ACT inhaler   Inhalation   Inhale 2 puffs into the lungs every 6 (six) hours as needed for wheezing or shortness of breath.          Marland Kitchen albuterol (PROVENTIL HFA;VENTOLIN HFA) 108 (90 Base) MCG/ACT inhaler   Inhalation   Inhale 2 puffs into the lungs every 6 (six) hours as needed for wheezing or shortness of breath.   1 Inhaler   0   . ALPRAZolam (XANAX) 1 MG tablet   Oral  Take 1 mg by mouth 3 (three) times daily.          Marland Kitchen aspirin 81 MG chewable tablet   Oral   Chew 1 tablet (81 mg total) by mouth daily.   30 tablet   0   . atorvastatin (LIPITOR) 40 MG tablet   Oral   Take 1 tablet (40 mg total) by mouth daily at 6 PM.   30 tablet   0   . azithromycin (ZITHROMAX) 250 MG tablet   Oral   Take 1 tablet (250 mg total) by mouth daily.   4 each   0   . busPIRone (BUSPAR) 15 MG tablet   Oral   Take 30 mg by mouth 2 (two) times daily.          . diclofenac sodium (VOLTAREN) 1 % GEL   Topical   Apply 2 g topically 4 (four) times daily as needed (for pain).         . DULoxetine (CYMBALTA) 60 MG capsule   Oral   Take 60 mg by mouth daily.         Marland Kitchen gabapentin (NEURONTIN) 300 MG capsule   Oral   Take 600 mg by mouth 6 (six)  times daily.         Marland Kitchen levothyroxine (SYNTHROID, LEVOTHROID) 75 MCG tablet   Oral   Take 75 mcg by mouth daily before breakfast.          . metoprolol tartrate (LOPRESSOR) 50 MG tablet   Oral   Take 1 tablet (50 mg total) by mouth 2 (two) times daily.   60 tablet   0   . morphine (MS CONTIN) 15 MG 12 hr tablet   Oral   Take 15-30 mg by mouth every 12 (twelve) hours. Pt takes two tablets in the morning and one at night.         . Multiple Vitamin (MULTIVITAMIN WITH MINERALS) TABS tablet   Oral   Take 1 tablet by mouth daily.         . nitroGLYCERIN (NITROSTAT) 0.4 MG SL tablet   Sublingual   Place 1 tablet (0.4 mg total) under the tongue every 5 (five) minutes as needed for chest pain. Take max 3 tablets for one episode.   30 tablet   0   . omeprazole (PRILOSEC) 20 MG capsule   Oral   Take 20 mg by mouth daily.          . ondansetron (ZOFRAN-ODT) 4 MG disintegrating tablet   Oral   Take 4 mg by mouth every 8 (eight) hours as needed for nausea or vomiting.         . Oxycodone HCl 10 MG TABS   Oral   Take 10 mg by mouth 6 (six) times daily.         . predniSONE (DELTASONE) 50 MG tablet   Oral   Take 1 tablet (50 mg total) by mouth daily with breakfast.   4 tablet   0   . testosterone cypionate (DEPOTESTOSTERONE CYPIONATE) 200 MG/ML injection   Intramuscular   Inject 150 mg into the muscle every 14 (fourteen) days.         . ticagrelor (BRILINTA) 90 MG TABS tablet   Oral   Take 1 tablet (90 mg total) by mouth 2 (two) times daily.   60 tablet   0   . zolpidem (AMBIEN) 10 MG tablet   Oral   Take 10 mg by mouth  at bedtime.           Allergies Lisinopril  Family History  Problem Relation Age of Onset  . Diabetes Father     Social History Social History  Substance Use Topics  . Smoking status: Never Smoker   . Smokeless tobacco: None  . Alcohol Use: No     Comment: not in 4 yr    Review of Systems Constitutional: No  fever/chills Eyes: No visual changes. ENT: No sore throat. Cardiovascular: +chest pain. Respiratory: +shortness of breath w/ exertion Gastrointestinal: No abdominal pain.  Nausea, no vomiting.  No diarrhea.  No constipation. Genitourinary: Negative for dysuria. Musculoskeletal: Negative for back pain. Skin: Negative for rash. Neurological: Negative for headaches, focal weakness or numbness.  10-point ROS otherwise negative.  ____________________________________________   PHYSICAL EXAM:  VITAL SIGNS: ED Triage Vitals  Enc Vitals Group     BP 07/31/15 1330 120/74 mmHg     Pulse Rate 07/31/15 1330 76     Resp 07/31/15 1330 18     Temp 07/31/15 1330 98.4 F (36.9 C)     Temp Source 07/31/15 1330 Oral     SpO2 07/31/15 1330 93 %     Weight 07/31/15 1330 300 lb (136.079 kg)     Height 07/31/15 1330 6\' 2"  (1.88 m)     Head Cir --      Peak Flow --      Pain Score 07/31/15 1331 6     Pain Loc --      Pain Edu? --      Excl. in [place]? --     Constitutional: Alert and oriented. Well appearing and in no acute distress. Eyes: Conjunctivae are normal. PERRL. EOMI. Head: Atraumatic. Nose: No congestion/rhinnorhea. Mouth/Throat: Mucous membranes are moist.  Oropharynx non-erythematous. Neck: No stridor.  No meningeal signs.   Cardiovascular: Normal rate, regular rhythm. Good peripheral circulation. Grossly normal heart sounds.   Respiratory: Normal respiratory effort.  No retractions. Lungs CTAB. Gastrointestinal: Obese. Soft and nontender. No distention.  Musculoskeletal: No lower extremity tenderness nor edema. No gross deformities of extremities. Neurologic:  Normal speech and language. No gross focal neurologic deficits are appreciated.  Skin:  Skin is warm, dry and intact. No rash noted. Psychiatric: Mood and affect are somewhat depressed. Speech is normal.  ____________________________________________   LABS (all labs ordered are listed, but only abnormal results are  displayed)  Labs Reviewed  BASIC METABOLIC PANEL - Abnormal; Notable for the following:    Glucose, Bld 124 (*)    Calcium 8.7 (*)    Anion gap 4 (*)    All other components within normal limits  CBC - Abnormal; Notable for the following:    RDW 15.5 (*)    All other components within normal limits  TROPONIN I  TROPONIN I   ____________________________________________  EKG  ED ECG REPORT I, Zahava Quant, the attending physician, personally viewed and interpreted this ECG.  Date: 07/31/2015 EKG Time: 13:29 Rate: 74 Rhythm: normal sinus rhythm QRS Axis: normal Intervals: normal ST/T Wave abnormalities: normal Conduction Disturbances: none Narrative Interpretation: unremarkable  ED ECG REPORT I, Paulina Muchmore, the attending physician, personally viewed and interpreted this ECG.  Date: 07/31/2015 EKG Time: 14:23 Rate: 70 Rhythm: normal sinus rhythm QRS Axis: normal Intervals: normal ST/T Wave abnormalities: normal Conduction Disturbances: none Narrative Interpretation: unremarkable.  Artifact is present on the EKG which made the computer think it was atrial fibrillation, but I still see the P waves embedded in the  artifact.   ____________________________________________  RADIOLOGY   Dg Chest 2 View  07/31/2015  CLINICAL DATA:  Chest pain that began last night EXAM: CHEST  2 VIEW COMPARISON:  07/09/2015 FINDINGS: Chronic bronchitic markings. There is no edema, consolidation, effusion, or pneumothorax. Normal heart size and stable mediastinal contours. Thoracic spine posterior fixation with dorsal column stimulator. IMPRESSION: Stable.  No evidence of acute disease. Electronically Signed   By: Monte Fantasia M.D.   On: 07/31/2015 14:31    ____________________________________________   PROCEDURES  Procedure(s) performed: None  Critical Care performed: No ____________________________________________   INITIAL IMPRESSION / ASSESSMENT AND PLAN / ED  COURSE  Pertinent labs & imaging results that were available during my care of the patient were reviewed by me and considered in my medical decision making (see chart for details).  The patient is likely having anginal/ischemic chest pain, but this seems to be roughly his baseline.  He has had a recent visit where he had a CT angiogram of his chest which was unremarkable.  He has close follow-up available with Dr. Humphrey Rolls and he had stents placed just about one month ago.  I will obtain 2 sets of troponins and attempt to control his pain and anticipate discharge for outpatient follow-up.  ----------------------------------------- 5:23 PM on 07/31/2015 -----------------------------------------  Patient is resting comfortably and in no acute distress.  He said his chest pain is better.  His second troponin is normal and the rest of his workup is unremarkable.  I encouraged him to follow up first thing in the morning with Dr. Humphrey Rolls, his cardiologist, for additional workup.  At this point in given his recent intervention I do not think he would benefit from hospitalization but I did stress to him the importance of close follow-up.  He understands and agrees.  I gave my usual and customary return precautions.     ____________________________________________  FINAL CLINICAL IMPRESSION(S) / ED DIAGNOSES  Final diagnoses:  Ischemic chest pain ([place])      NEW MEDICATIONS STARTED DURING THIS VISIT:  New Prescriptions   No medications on file      Note:  This document was prepared using Dragon voice recognition software and may include unintentional dictation errors.   Hinda Kehr, MD 07/31/15 1726

## 2015-07-31 NOTE — ED Notes (Addendum)
States chest pain that began last night, SOB and chest tightness, states he took 2 nitro last night and 1 nitro today with relief, states hx of cardiac stents placed in march, pt has neuro stimulator in place

## 2015-08-01 LAB — CULTURE, BLOOD (ROUTINE X 2)
Culture: NO GROWTH
Culture: NO GROWTH

## 2015-08-14 ENCOUNTER — Emergency Department (HOSPITAL_COMMUNITY): Payer: BLUE CROSS/BLUE SHIELD

## 2015-08-14 ENCOUNTER — Encounter (HOSPITAL_COMMUNITY): Payer: Self-pay | Admitting: Family Medicine

## 2015-08-14 ENCOUNTER — Emergency Department (HOSPITAL_COMMUNITY)
Admission: EM | Admit: 2015-08-14 | Discharge: 2015-08-14 | Disposition: A | Discharge status: Home / Self Care | Payer: BLUE CROSS/BLUE SHIELD | Attending: Emergency Medicine | Admitting: Emergency Medicine

## 2015-08-14 DIAGNOSIS — M79662 Pain in left lower leg: Secondary | ICD-10-CM | POA: Diagnosis not present

## 2015-08-14 DIAGNOSIS — Z8711 Personal history of peptic ulcer disease: Secondary | ICD-10-CM | POA: Diagnosis not present

## 2015-08-14 DIAGNOSIS — F329 Major depressive disorder, single episode, unspecified: Secondary | ICD-10-CM | POA: Diagnosis not present

## 2015-08-14 DIAGNOSIS — Z791 Long term (current) use of non-steroidal anti-inflammatories (NSAID): Secondary | ICD-10-CM | POA: Insufficient documentation

## 2015-08-14 DIAGNOSIS — Z79899 Other long term (current) drug therapy: Secondary | ICD-10-CM | POA: Diagnosis not present

## 2015-08-14 DIAGNOSIS — R079 Chest pain, unspecified: Secondary | ICD-10-CM

## 2015-08-14 DIAGNOSIS — G4700 Insomnia, unspecified: Secondary | ICD-10-CM | POA: Diagnosis not present

## 2015-08-14 DIAGNOSIS — Z7951 Long term (current) use of inhaled steroids: Secondary | ICD-10-CM | POA: Insufficient documentation

## 2015-08-14 DIAGNOSIS — E039 Hypothyroidism, unspecified: Secondary | ICD-10-CM | POA: Insufficient documentation

## 2015-08-14 DIAGNOSIS — K219 Gastro-esophageal reflux disease without esophagitis: Secondary | ICD-10-CM | POA: Insufficient documentation

## 2015-08-14 DIAGNOSIS — F419 Anxiety disorder, unspecified: Secondary | ICD-10-CM | POA: Diagnosis not present

## 2015-08-14 DIAGNOSIS — Z7982 Long term (current) use of aspirin: Secondary | ICD-10-CM | POA: Insufficient documentation

## 2015-08-14 LAB — CBC
HEMATOCRIT: 51 % (ref 39.0–52.0)
HEMOGLOBIN: 17 g/dL (ref 13.0–17.0)
MCH: 31.3 pg (ref 26.0–34.0)
MCHC: 33.3 g/dL (ref 30.0–36.0)
MCV: 93.9 fL (ref 78.0–100.0)
Platelets: 249 10*3/uL (ref 150–400)
RBC: 5.43 MIL/uL (ref 4.22–5.81)
RDW: 13.8 % (ref 11.5–15.5)
WBC: 9 10*3/uL (ref 4.0–10.5)

## 2015-08-14 LAB — BASIC METABOLIC PANEL
ANION GAP: 10 (ref 5–15)
BUN: 7 mg/dL (ref 6–20)
CO2: 28 mmol/L (ref 22–32)
Calcium: 9.5 mg/dL (ref 8.9–10.3)
Chloride: 103 mmol/L (ref 101–111)
Creatinine, Ser: 1.01 mg/dL (ref 0.61–1.24)
GFR calc Af Amer: >60 mL/min (ref >60)
GLUCOSE: 128 mg/dL — AB (ref 65–99)
POTASSIUM: 4.4 mmol/L (ref 3.5–5.1)
Sodium: 141 mmol/L (ref 135–145)

## 2015-08-14 LAB — I-STAT TROPONIN, ED
Troponin i, poc: 0.01 ng/mL (ref 0.00–0.08)
Troponin i, poc: 0 ng/mL (ref 0.00–0.08)

## 2015-08-14 MED ORDER — NITROGLYCERIN 0.4 MG SL SUBL
0.4000 mg | SUBLINGUAL_TABLET | SUBLINGUAL | Status: DC | PRN
  Administered 2015-08-14 (×2): 0.4 mg via SUBLINGUAL
  Filled 2015-08-14: qty 1

## 2015-08-14 MED ORDER — OXYCODONE HCL 10 MG PO TABS: 10.0000 mg | ORAL_TABLET | Freq: Every day | ORAL | Status: DC

## 2015-08-14 MED ORDER — OXYCODONE HCL 5 MG PO TABS
10.0000 mg | ORAL_TABLET | Freq: Every day | ORAL | Status: DC
  Administered 2015-08-14: 10 mg via ORAL
  Filled 2015-08-14: qty 2
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
## 2015-08-14 NOTE — ED Notes (Signed)
EDP at bedside  

## 2015-08-14 NOTE — Discharge Instructions (Signed)
Chest Wall Pain Chest wall pain is pain in or around the bones and muscles of your chest. Sometimes, an injury causes this pain. Sometimes, the cause may not be known. This pain may take several weeks or longer to get better. HOME CARE INSTRUCTIONS  Pay attention to any changes in your symptoms. Take these actions to help with your pain:   Rest as told by your health care provider.   Avoid activities that cause pain. These include any activities that use your chest muscles or your abdominal and side muscles to lift heavy items.   If directed, apply ice to the painful area:  Put ice in a plastic bag.  Place a towel between your skin and the bag.  Leave the ice on for 20 minutes, 2-3 times per day.  Take over-the-counter and prescription medicines only as told by your health care provider.  Do not use tobacco products, including cigarettes, chewing tobacco, and e-cigarettes. If you need help quitting, ask your health care provider.  Keep all follow-up visits as told by your health care provider. This is important. SEEK MEDICAL CARE IF:  You have a fever.  Your chest pain becomes worse.  You have new symptoms. SEEK IMMEDIATE MEDICAL CARE IF:  You have nausea or vomiting.  You feel sweaty or light-headed.  You have a cough with phlegm (sputum) or you cough up blood.  You develop shortness of breath.   This information is not intended to replace advice given to you by your health care provider. Make sure you discuss any questions you have with your health care provider.   Take ibuprofen daily for continued chest pain to help with inflammation. Keep scheduled appointment with cardiology on Monday. Return to the ED if you experience severe worsening of your symptoms, loss of consciousness, dizziness, difficulty breathing.

## 2015-08-14 NOTE — ED Notes (Signed)
Pt here for ongoing chest pain, weakness since he had stent placed march 6th. sts he has been taking multiple nitro without relief.

## 2015-08-19 DIAGNOSIS — I10 Essential (primary) hypertension: Secondary | ICD-10-CM | POA: Diagnosis not present

## 2015-08-19 DIAGNOSIS — E782 Mixed hyperlipidemia: Secondary | ICD-10-CM | POA: Diagnosis not present

## 2015-08-19 DIAGNOSIS — R079 Chest pain, unspecified: Secondary | ICD-10-CM | POA: Diagnosis not present

## 2015-08-19 DIAGNOSIS — I2510 Atherosclerotic heart disease of native coronary artery without angina pectoris: Secondary | ICD-10-CM | POA: Diagnosis not present

## 2015-08-21 DIAGNOSIS — M545 Low back pain: Secondary | ICD-10-CM | POA: Diagnosis not present

## 2015-08-21 DIAGNOSIS — M25562 Pain in left knee: Secondary | ICD-10-CM | POA: Diagnosis not present

## 2015-08-21 DIAGNOSIS — M79605 Pain in left leg: Secondary | ICD-10-CM | POA: Diagnosis not present

## 2015-08-21 DIAGNOSIS — G890 Central pain syndrome: Secondary | ICD-10-CM | POA: Diagnosis not present

## 2015-08-26 DIAGNOSIS — R079 Chest pain, unspecified: Secondary | ICD-10-CM | POA: Diagnosis not present

## 2015-08-28 DIAGNOSIS — I2510 Atherosclerotic heart disease of native coronary artery without angina pectoris: Secondary | ICD-10-CM | POA: Diagnosis not present

## 2015-08-28 DIAGNOSIS — R9430 Abnormal result of cardiovascular function study, unspecified: Secondary | ICD-10-CM | POA: Diagnosis not present

## 2015-08-28 DIAGNOSIS — R079 Chest pain, unspecified: Secondary | ICD-10-CM | POA: Diagnosis not present

## 2015-08-29 DIAGNOSIS — I10 Essential (primary) hypertension: Secondary | ICD-10-CM | POA: Diagnosis not present

## 2015-08-29 DIAGNOSIS — I2510 Atherosclerotic heart disease of native coronary artery without angina pectoris: Secondary | ICD-10-CM | POA: Diagnosis not present

## 2015-08-29 DIAGNOSIS — R079 Chest pain, unspecified: Secondary | ICD-10-CM | POA: Diagnosis not present

## 2015-08-29 DIAGNOSIS — E782 Mixed hyperlipidemia: Secondary | ICD-10-CM | POA: Diagnosis not present

## 2015-09-03 DIAGNOSIS — I2584 Coronary atherosclerosis due to calcified coronary lesion: Secondary | ICD-10-CM | POA: Diagnosis not present

## 2015-09-03 DIAGNOSIS — G4700 Insomnia, unspecified: Secondary | ICD-10-CM | POA: Diagnosis not present

## 2015-09-03 DIAGNOSIS — E119 Type 2 diabetes mellitus without complications: Secondary | ICD-10-CM | POA: Diagnosis not present

## 2015-09-05 DIAGNOSIS — N401 Enlarged prostate with lower urinary tract symptoms: Secondary | ICD-10-CM | POA: Diagnosis not present

## 2015-09-10 ENCOUNTER — Ambulatory Visit: Payer: BLUE CROSS/BLUE SHIELD | Admitting: Cardiovascular Disease

## 2015-09-13 DIAGNOSIS — M7989 Other specified soft tissue disorders: Secondary | ICD-10-CM | POA: Diagnosis not present

## 2015-09-13 DIAGNOSIS — M79605 Pain in left leg: Secondary | ICD-10-CM | POA: Diagnosis not present

## 2015-09-13 DIAGNOSIS — X58XXXD Exposure to other specified factors, subsequent encounter: Secondary | ICD-10-CM | POA: Diagnosis not present

## 2015-09-13 DIAGNOSIS — S82102D Unspecified fracture of upper end of left tibia, subsequent encounter for closed fracture with routine healing: Secondary | ICD-10-CM | POA: Diagnosis not present

## 2015-09-16 DIAGNOSIS — I10 Essential (primary) hypertension: Secondary | ICD-10-CM | POA: Diagnosis not present

## 2015-09-16 DIAGNOSIS — E782 Mixed hyperlipidemia: Secondary | ICD-10-CM | POA: Diagnosis not present

## 2015-09-16 DIAGNOSIS — I2510 Atherosclerotic heart disease of native coronary artery without angina pectoris: Secondary | ICD-10-CM | POA: Diagnosis not present

## 2015-09-18 DIAGNOSIS — G4733 Obstructive sleep apnea (adult) (pediatric): Secondary | ICD-10-CM | POA: Diagnosis not present

## 2015-09-20 DIAGNOSIS — M542 Cervicalgia: Secondary | ICD-10-CM | POA: Diagnosis not present

## 2015-09-20 DIAGNOSIS — M545 Low back pain: Secondary | ICD-10-CM | POA: Diagnosis not present

## 2015-09-20 DIAGNOSIS — G894 Chronic pain syndrome: Secondary | ICD-10-CM | POA: Diagnosis not present

## 2015-09-21 DIAGNOSIS — Z79891 Long term (current) use of opiate analgesic: Secondary | ICD-10-CM | POA: Diagnosis not present

## 2015-10-07 DIAGNOSIS — Z6839 Body mass index (BMI) 39.0-39.9, adult: Secondary | ICD-10-CM | POA: Diagnosis not present

## 2015-10-07 DIAGNOSIS — Z79899 Other long term (current) drug therapy: Secondary | ICD-10-CM | POA: Diagnosis not present

## 2015-10-07 DIAGNOSIS — E236 Other disorders of pituitary gland: Secondary | ICD-10-CM | POA: Diagnosis not present

## 2015-10-07 DIAGNOSIS — N401 Enlarged prostate with lower urinary tract symptoms: Secondary | ICD-10-CM | POA: Diagnosis not present

## 2015-10-07 DIAGNOSIS — N529 Male erectile dysfunction, unspecified: Secondary | ICD-10-CM | POA: Diagnosis not present

## 2015-10-17 DIAGNOSIS — G890 Central pain syndrome: Secondary | ICD-10-CM | POA: Diagnosis not present

## 2015-10-17 DIAGNOSIS — G894 Chronic pain syndrome: Secondary | ICD-10-CM | POA: Diagnosis not present

## 2015-10-17 DIAGNOSIS — G541 Lumbosacral plexus disorders: Secondary | ICD-10-CM | POA: Diagnosis not present

## 2015-10-17 DIAGNOSIS — Z79891 Long term (current) use of opiate analgesic: Secondary | ICD-10-CM | POA: Diagnosis not present

## 2015-11-05 DIAGNOSIS — G8929 Other chronic pain: Secondary | ICD-10-CM | POA: Diagnosis not present

## 2015-11-05 DIAGNOSIS — Z969 Presence of functional implant, unspecified: Secondary | ICD-10-CM | POA: Diagnosis not present

## 2015-11-05 DIAGNOSIS — M25562 Pain in left knee: Secondary | ICD-10-CM | POA: Diagnosis not present

## 2015-12-13 DIAGNOSIS — M545 Low back pain: Secondary | ICD-10-CM | POA: Diagnosis not present

## 2015-12-13 DIAGNOSIS — G894 Chronic pain syndrome: Secondary | ICD-10-CM | POA: Diagnosis not present

## 2015-12-13 DIAGNOSIS — M25572 Pain in left ankle and joints of left foot: Secondary | ICD-10-CM | POA: Diagnosis not present

## 2015-12-13 DIAGNOSIS — Z79891 Long term (current) use of opiate analgesic: Secondary | ICD-10-CM | POA: Diagnosis not present

## 2015-12-17 DIAGNOSIS — Z79891 Long term (current) use of opiate analgesic: Secondary | ICD-10-CM | POA: Diagnosis not present

## 2015-12-17 DIAGNOSIS — M545 Low back pain: Secondary | ICD-10-CM | POA: Diagnosis not present

## 2015-12-30 DIAGNOSIS — F331 Major depressive disorder, recurrent, moderate: Secondary | ICD-10-CM | POA: Diagnosis not present

## 2015-12-30 DIAGNOSIS — I10 Essential (primary) hypertension: Secondary | ICD-10-CM | POA: Diagnosis not present

## 2015-12-30 DIAGNOSIS — E1140 Type 2 diabetes mellitus with diabetic neuropathy, unspecified: Secondary | ICD-10-CM | POA: Diagnosis not present

## 2016-01-03 DIAGNOSIS — Z23 Encounter for immunization: Secondary | ICD-10-CM | POA: Diagnosis not present

## 2016-01-08 DIAGNOSIS — T8484XA Pain due to internal orthopedic prosthetic devices, implants and grafts, initial encounter: Secondary | ICD-10-CM | POA: Diagnosis not present

## 2016-01-08 DIAGNOSIS — Z969 Presence of functional implant, unspecified: Secondary | ICD-10-CM | POA: Diagnosis not present

## 2016-01-08 DIAGNOSIS — G8929 Other chronic pain: Secondary | ICD-10-CM | POA: Diagnosis not present

## 2016-01-08 DIAGNOSIS — R350 Frequency of micturition: Secondary | ICD-10-CM | POA: Diagnosis not present

## 2016-01-08 DIAGNOSIS — G8918 Other acute postprocedural pain: Secondary | ICD-10-CM | POA: Diagnosis not present

## 2016-01-08 DIAGNOSIS — F329 Major depressive disorder, single episode, unspecified: Secondary | ICD-10-CM | POA: Diagnosis not present

## 2016-01-08 DIAGNOSIS — T85848D Pain due to other internal prosthetic devices, implants and grafts, subsequent encounter: Secondary | ICD-10-CM | POA: Diagnosis not present

## 2016-01-08 DIAGNOSIS — M79605 Pain in left leg: Secondary | ICD-10-CM | POA: Diagnosis not present

## 2016-01-08 DIAGNOSIS — Z472 Encounter for removal of internal fixation device: Secondary | ICD-10-CM | POA: Diagnosis not present

## 2016-01-08 DIAGNOSIS — Z9884 Bariatric surgery status: Secondary | ICD-10-CM | POA: Diagnosis not present

## 2016-01-08 DIAGNOSIS — K219 Gastro-esophageal reflux disease without esophagitis: Secondary | ICD-10-CM | POA: Diagnosis not present

## 2016-01-08 DIAGNOSIS — Y831 Surgical operation with implant of artificial internal device as the cause of abnormal reaction of the patient, or of later complication, without mention of misadventure at the time of the procedure: Secondary | ICD-10-CM | POA: Diagnosis not present

## 2016-01-08 DIAGNOSIS — R202 Paresthesia of skin: Secondary | ICD-10-CM | POA: Diagnosis not present

## 2016-01-10 DIAGNOSIS — G894 Chronic pain syndrome: Secondary | ICD-10-CM | POA: Diagnosis not present

## 2016-01-10 DIAGNOSIS — M25562 Pain in left knee: Secondary | ICD-10-CM | POA: Diagnosis not present

## 2016-01-10 DIAGNOSIS — Z79891 Long term (current) use of opiate analgesic: Secondary | ICD-10-CM | POA: Diagnosis not present

## 2016-01-10 DIAGNOSIS — M79605 Pain in left leg: Secondary | ICD-10-CM | POA: Diagnosis not present

## 2016-01-23 DIAGNOSIS — M25562 Pain in left knee: Secondary | ICD-10-CM | POA: Diagnosis not present

## 2016-01-23 DIAGNOSIS — Z472 Encounter for removal of internal fixation device: Secondary | ICD-10-CM | POA: Diagnosis not present

## 2016-01-23 DIAGNOSIS — M1712 Unilateral primary osteoarthritis, left knee: Secondary | ICD-10-CM | POA: Diagnosis not present

## 2016-02-07 DIAGNOSIS — Z79891 Long term (current) use of opiate analgesic: Secondary | ICD-10-CM | POA: Diagnosis not present

## 2016-03-06 DIAGNOSIS — Z79891 Long term (current) use of opiate analgesic: Secondary | ICD-10-CM | POA: Diagnosis not present

## 2016-04-03 DIAGNOSIS — M25562 Pain in left knee: Secondary | ICD-10-CM | POA: Diagnosis not present

## 2016-04-03 DIAGNOSIS — G894 Chronic pain syndrome: Secondary | ICD-10-CM | POA: Diagnosis not present

## 2016-04-03 DIAGNOSIS — Z79891 Long term (current) use of opiate analgesic: Secondary | ICD-10-CM | POA: Diagnosis not present

## 2016-04-03 DIAGNOSIS — M545 Low back pain: Secondary | ICD-10-CM | POA: Diagnosis not present

## 2016-04-03 DIAGNOSIS — M79605 Pain in left leg: Secondary | ICD-10-CM | POA: Diagnosis not present

## 2016-04-07 DIAGNOSIS — N529 Male erectile dysfunction, unspecified: Secondary | ICD-10-CM | POA: Diagnosis not present

## 2016-04-07 DIAGNOSIS — E291 Testicular hypofunction: Secondary | ICD-10-CM | POA: Diagnosis not present

## 2016-04-07 DIAGNOSIS — H25813 Combined forms of age-related cataract, bilateral: Secondary | ICD-10-CM | POA: Diagnosis not present

## 2016-04-07 DIAGNOSIS — N401 Enlarged prostate with lower urinary tract symptoms: Secondary | ICD-10-CM | POA: Diagnosis not present

## 2016-04-28 DIAGNOSIS — E039 Hypothyroidism, unspecified: Secondary | ICD-10-CM | POA: Diagnosis not present

## 2016-04-28 DIAGNOSIS — F331 Major depressive disorder, recurrent, moderate: Secondary | ICD-10-CM | POA: Diagnosis not present

## 2016-04-28 DIAGNOSIS — F411 Generalized anxiety disorder: Secondary | ICD-10-CM | POA: Diagnosis not present

## 2016-04-28 DIAGNOSIS — E119 Type 2 diabetes mellitus without complications: Secondary | ICD-10-CM | POA: Diagnosis not present

## 2016-05-01 DIAGNOSIS — G894 Chronic pain syndrome: Secondary | ICD-10-CM | POA: Diagnosis not present

## 2016-05-01 DIAGNOSIS — M25562 Pain in left knee: Secondary | ICD-10-CM | POA: Diagnosis not present

## 2016-05-01 DIAGNOSIS — Z79891 Long term (current) use of opiate analgesic: Secondary | ICD-10-CM | POA: Diagnosis not present

## 2016-05-01 DIAGNOSIS — M545 Low back pain: Secondary | ICD-10-CM | POA: Diagnosis not present

## 2016-05-01 DIAGNOSIS — M79605 Pain in left leg: Secondary | ICD-10-CM | POA: Diagnosis not present

## 2016-05-19 DIAGNOSIS — E236 Other disorders of pituitary gland: Secondary | ICD-10-CM | POA: Diagnosis not present

## 2016-05-27 ENCOUNTER — Encounter (HOSPITAL_COMMUNITY): Payer: Self-pay

## 2016-05-29 DIAGNOSIS — M79605 Pain in left leg: Secondary | ICD-10-CM | POA: Diagnosis not present

## 2016-05-29 DIAGNOSIS — M25562 Pain in left knee: Secondary | ICD-10-CM | POA: Diagnosis not present

## 2016-05-29 DIAGNOSIS — M545 Low back pain: Secondary | ICD-10-CM | POA: Diagnosis not present

## 2016-05-29 DIAGNOSIS — Z79891 Long term (current) use of opiate analgesic: Secondary | ICD-10-CM | POA: Diagnosis not present

## 2016-05-29 DIAGNOSIS — G894 Chronic pain syndrome: Secondary | ICD-10-CM | POA: Diagnosis not present

## 2016-06-25 ENCOUNTER — Other Ambulatory Visit: Payer: Self-pay | Admitting: Internal Medicine

## 2016-06-25 ENCOUNTER — Ambulatory Visit
Admission: RE | Admit: 2016-06-25 | Discharge: 2016-06-25 | Disposition: A | Discharge status: Home / Self Care | Payer: BLUE CROSS/BLUE SHIELD | Source: Ambulatory Visit | Attending: Internal Medicine | Admitting: Internal Medicine

## 2016-06-25 ENCOUNTER — Encounter: Payer: Self-pay | Admitting: *Deleted

## 2016-06-25 ENCOUNTER — Encounter
Admission: RE | Disposition: A | Discharge status: Home / Self Care | Payer: Self-pay | Source: Ambulatory Visit | Attending: Internal Medicine

## 2016-06-25 DIAGNOSIS — I208 Other forms of angina pectoris: Secondary | ICD-10-CM | POA: Diagnosis not present

## 2016-06-25 DIAGNOSIS — E669 Obesity, unspecified: Secondary | ICD-10-CM | POA: Insufficient documentation

## 2016-06-25 DIAGNOSIS — R079 Chest pain, unspecified: Secondary | ICD-10-CM

## 2016-06-25 DIAGNOSIS — Z6838 Body mass index (BMI) 38.0-38.9, adult: Secondary | ICD-10-CM | POA: Insufficient documentation

## 2016-06-25 DIAGNOSIS — I25110 Atherosclerotic heart disease of native coronary artery with unstable angina pectoris: Secondary | ICD-10-CM | POA: Diagnosis not present

## 2016-06-25 DIAGNOSIS — E785 Hyperlipidemia, unspecified: Secondary | ICD-10-CM | POA: Diagnosis not present

## 2016-06-25 DIAGNOSIS — Z79899 Other long term (current) drug therapy: Secondary | ICD-10-CM | POA: Insufficient documentation

## 2016-06-25 DIAGNOSIS — Z955 Presence of coronary angioplasty implant and graft: Secondary | ICD-10-CM | POA: Insufficient documentation

## 2016-06-25 DIAGNOSIS — I200 Unstable angina: Secondary | ICD-10-CM | POA: Diagnosis present

## 2016-06-25 DIAGNOSIS — R0789 Other chest pain: Secondary | ICD-10-CM | POA: Diagnosis not present

## 2016-06-25 DIAGNOSIS — I2510 Atherosclerotic heart disease of native coronary artery without angina pectoris: Secondary | ICD-10-CM | POA: Insufficient documentation

## 2016-06-25 DIAGNOSIS — I10 Essential (primary) hypertension: Secondary | ICD-10-CM | POA: Diagnosis not present

## 2016-06-25 DIAGNOSIS — E7800 Pure hypercholesterolemia, unspecified: Secondary | ICD-10-CM | POA: Insufficient documentation

## 2016-06-25 HISTORY — PX: LEFT HEART CATH AND CORONARY ANGIOGRAPHY: CATH118249

## 2016-06-25 HISTORY — DX: Essential (primary) hypertension: I10

## 2016-06-25 HISTORY — DX: Angina pectoris, unspecified: I20.9

## 2016-06-25 HISTORY — DX: Acute myocardial infarction, unspecified: I21.9

## 2016-06-25 HISTORY — DX: Atherosclerotic heart disease of native coronary artery without angina pectoris: I25.10

## 2016-06-25 LAB — BASIC METABOLIC PANEL
ANION GAP: 7 (ref 5–15)
BUN: 12 mg/dL (ref 6–20)
CALCIUM: 8.7 mg/dL — AB (ref 8.9–10.3)
CO2: 25 mmol/L (ref 22–32)
Chloride: 107 mmol/L (ref 101–111)
Creatinine, Ser: 0.96 mg/dL (ref 0.61–1.24)
Glucose, Bld: 131 mg/dL — ABNORMAL HIGH (ref 65–99)
POTASSIUM: 3.7 mmol/L (ref 3.5–5.1)
Sodium: 139 mmol/L (ref 135–145)

## 2016-06-25 LAB — CBC WITH DIFFERENTIAL/PLATELET
BASOS ABS: 0.1 10*3/uL (ref 0–0.1)
BASOS PCT: 1 %
Eosinophils Absolute: 0.2 10*3/uL (ref 0–0.7)
Eosinophils Relative: 3 %
HEMATOCRIT: 46.8 % (ref 40.0–52.0)
Hemoglobin: 15.7 g/dL (ref 13.0–18.0)
LYMPHS PCT: 33 %
Lymphs Abs: 2.7 10*3/uL (ref 1.0–3.6)
MCH: 28 pg (ref 26.0–34.0)
MCHC: 33.5 g/dL (ref 32.0–36.0)
MCV: 83.6 fL (ref 80.0–100.0)
Monocytes Absolute: 0.6 10*3/uL (ref 0.2–1.0)
Monocytes Relative: 8 %
NEUTROS ABS: 4.8 10*3/uL (ref 1.4–6.5)
Neutrophils Relative %: 57 %
PLATELETS: 203 10*3/uL (ref 150–440)
RBC: 5.6 MIL/uL (ref 4.40–5.90)
RDW: 15.1 % — ABNORMAL HIGH (ref 11.5–14.5)
WBC: 8.4 10*3/uL (ref 3.8–10.6)

## 2016-06-25 SURGERY — LEFT HEART CATH AND CORONARY ANGIOGRAPHY
Anesthesia: Moderate Sedation

## 2016-06-25 MED ORDER — IOPAMIDOL (ISOVUE-300) INJECTION 61%
INTRAVENOUS | Status: DC | PRN
  Administered 2016-06-25: 130 mL via INTRA_ARTERIAL

## 2016-06-25 MED ORDER — SODIUM CHLORIDE 0.9% FLUSH: 3.0000 mL | INTRAVENOUS | Status: DC | PRN

## 2016-06-25 MED ORDER — ACETAMINOPHEN 325 MG PO TABS
650.0000 mg | ORAL_TABLET | Freq: Once | ORAL | Status: AC
  Administered 2016-06-25: 650 mg via ORAL

## 2016-06-25 MED ORDER — SODIUM CHLORIDE 0.9 % WEIGHT BASED INFUSION
1.0000 mL/kg/h | INTRAVENOUS | Status: DC
Start: 2016-06-26 — End: 2016-06-25
  Administered 2016-06-25: 1 mL/kg/h via INTRAVENOUS

## 2016-06-25 MED ORDER — NITROGLYCERIN 2 % TD OINT
1.0000 [in_us] | TOPICAL_OINTMENT | Freq: Once | TRANSDERMAL | Status: AC
  Administered 2016-06-25: 1 [in_us] via TOPICAL

## 2016-06-25 MED ORDER — NITROGLYCERIN 2 % TD OINT
TOPICAL_OINTMENT | TRANSDERMAL | Status: AC
  Administered 2016-06-25: 1 [in_us] via TOPICAL
  Filled 2016-06-25: qty 1

## 2016-06-25 MED ORDER — MIDAZOLAM HCL 2 MG/2ML IJ SOLN
INTRAMUSCULAR | Status: AC
  Filled 2016-06-25: qty 2

## 2016-06-25 MED ORDER — MIDAZOLAM HCL 2 MG/2ML IJ SOLN
INTRAMUSCULAR | Status: DC | PRN
  Administered 2016-06-25: 1.5 mg via INTRAVENOUS

## 2016-06-25 MED ORDER — NITROGLYCERIN 0.4 MG SL SUBL
0.4000 mg | SUBLINGUAL_TABLET | SUBLINGUAL | Status: DC | PRN
  Administered 2016-06-25 (×3): 0.4 mg via SUBLINGUAL

## 2016-06-25 MED ORDER — FENTANYL CITRATE (PF) 100 MCG/2ML IJ SOLN
INTRAMUSCULAR | Status: AC
  Filled 2016-06-25: qty 2

## 2016-06-25 MED ORDER — NITROGLYCERIN 0.4 MG SL SUBL
SUBLINGUAL_TABLET | SUBLINGUAL | Status: AC
  Administered 2016-06-25: 0.4 mg via SUBLINGUAL
  Filled 2016-06-25: qty 3

## 2016-06-25 MED ORDER — ASPIRIN 81 MG PO CHEW
CHEWABLE_TABLET | ORAL | Status: AC
  Filled 2016-06-25: qty 1

## 2016-06-25 MED ORDER — SODIUM CHLORIDE 0.9 % WEIGHT BASED INFUSION
3.0000 mL/kg/h | INTRAVENOUS | Status: DC
Start: 2016-06-26 — End: 2016-06-25

## 2016-06-25 MED ORDER — SODIUM CHLORIDE 0.9 % IV SOLN: 250.0000 mL | INTRAVENOUS | Status: DC | PRN

## 2016-06-25 MED ORDER — ACETAMINOPHEN 325 MG PO TABS
ORAL_TABLET | ORAL | Status: AC
  Administered 2016-06-25: 650 mg via ORAL
  Filled 2016-06-25: qty 2

## 2016-06-25 MED ORDER — ASPIRIN 81 MG PO CHEW
81.0000 mg | CHEWABLE_TABLET | Freq: Once | ORAL | Status: AC
  Administered 2016-06-25: 81 mg via ORAL

## 2016-06-25 MED ORDER — SODIUM CHLORIDE 0.9% FLUSH: 3.0000 mL | Freq: Two times a day (BID) | INTRAVENOUS | Status: DC

## 2016-06-25 MED ORDER — ASPIRIN 81 MG PO CHEW: 81.0000 mg | CHEWABLE_TABLET | ORAL | Status: DC

## 2016-06-25 MED ORDER — FENTANYL CITRATE (PF) 100 MCG/2ML IJ SOLN
INTRAMUSCULAR | Status: DC | PRN
  Administered 2016-06-25: 50 ug via INTRAVENOUS

## 2016-06-25 SURGICAL SUPPLY — 9 items
CATH 5FR PIGTAIL DIAGNOSTIC (CATHETERS) ×2 IMPLANT
CATH INFINITI 5FR JL4 (CATHETERS) ×2 IMPLANT
CATH INFINITI JR4 5F (CATHETERS) ×2 IMPLANT
DEVICE CLOSURE MYNXGRIP 5F (Vascular Products) ×2 IMPLANT
KIT MANI 3VAL PERCEP (MISCELLANEOUS) ×2 IMPLANT
NEEDLE PERC 18GX7CM (NEEDLE) ×2 IMPLANT
PACK CARDIAC CATH (CUSTOM PROCEDURE TRAY) ×2 IMPLANT
SHEATH PINNACLE 5F 10CM (SHEATH) ×2 IMPLANT
WIRE EMERALD 3MM-J .035X150CM (WIRE) ×2 IMPLANT

## 2016-06-25 NOTE — OR Nursing (Signed)
Dr Nehemiah Massed notified that since pt cath delayed for emergency he was requesting narcotic to help with his chest pain, which he has had for 4 days. Dr Nehemiah Massed ordered NTG paste, sl nitroglycern and ASA. Informed him that the pt had stopped asa 6 months ago due to bleeding ulcer, instructed to give asa now. Pt instructed on therapy plan and agreeable.

## 2016-06-25 NOTE — Discharge Instructions (Signed)

## 2016-06-26 ENCOUNTER — Encounter: Payer: Self-pay | Admitting: Internal Medicine

## 2016-06-26 DIAGNOSIS — M25512 Pain in left shoulder: Secondary | ICD-10-CM | POA: Diagnosis not present

## 2016-06-26 DIAGNOSIS — Z79891 Long term (current) use of opiate analgesic: Secondary | ICD-10-CM | POA: Diagnosis not present

## 2016-06-26 DIAGNOSIS — M79605 Pain in left leg: Secondary | ICD-10-CM | POA: Diagnosis not present

## 2016-06-26 DIAGNOSIS — M545 Low back pain: Secondary | ICD-10-CM | POA: Diagnosis not present

## 2016-06-30 ENCOUNTER — Encounter: Payer: Self-pay | Admitting: Psychiatry

## 2016-06-30 ENCOUNTER — Ambulatory Visit (INDEPENDENT_AMBULATORY_CARE_PROVIDER_SITE_OTHER): Payer: BLUE CROSS/BLUE SHIELD | Admitting: Psychiatry

## 2016-06-30 DIAGNOSIS — E782 Mixed hyperlipidemia: Secondary | ICD-10-CM | POA: Diagnosis not present

## 2016-06-30 DIAGNOSIS — F329 Major depressive disorder, single episode, unspecified: Secondary | ICD-10-CM | POA: Diagnosis not present

## 2016-06-30 DIAGNOSIS — F418 Other specified anxiety disorders: Secondary | ICD-10-CM

## 2016-06-30 DIAGNOSIS — I2510 Atherosclerotic heart disease of native coronary artery without angina pectoris: Secondary | ICD-10-CM | POA: Diagnosis not present

## 2016-06-30 DIAGNOSIS — R079 Chest pain, unspecified: Secondary | ICD-10-CM | POA: Diagnosis not present

## 2016-06-30 DIAGNOSIS — I10 Essential (primary) hypertension: Secondary | ICD-10-CM | POA: Diagnosis not present
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
## 2016-07-02 ENCOUNTER — Telehealth: Payer: Self-pay

## 2016-07-02 NOTE — Telephone Encounter (Signed)
called pt to check and make sure that he had contact his pcp in regards to his high bp when he was seen on 06-30-16.  pt states that when he left our office he went to see dr. Nehemiah Massed at [facility] clinic and he gave him a blood pressure medication to take.  Pt was thankful that I called to check on him and making sure that he had seen someone about bp.

## 2016-07-03 DIAGNOSIS — E291 Testicular hypofunction: Secondary | ICD-10-CM | POA: Diagnosis not present

## 2016-07-09 ENCOUNTER — Ambulatory Visit: Payer: BLUE CROSS/BLUE SHIELD | Admitting: Licensed Clinical Social Worker

## 2016-07-24 DIAGNOSIS — G894 Chronic pain syndrome: Secondary | ICD-10-CM | POA: Diagnosis not present

## 2016-07-24 DIAGNOSIS — M25562 Pain in left knee: Secondary | ICD-10-CM | POA: Diagnosis not present

## 2016-07-24 DIAGNOSIS — M79605 Pain in left leg: Secondary | ICD-10-CM | POA: Diagnosis not present

## 2016-07-24 DIAGNOSIS — M545 Low back pain: Secondary | ICD-10-CM | POA: Diagnosis not present

## 2016-08-06 DIAGNOSIS — G8929 Other chronic pain: Secondary | ICD-10-CM | POA: Insufficient documentation

## 2016-08-06 DIAGNOSIS — F1410 Cocaine abuse, uncomplicated: Secondary | ICD-10-CM | POA: Diagnosis not present

## 2016-08-06 DIAGNOSIS — M25562 Pain in left knee: Secondary | ICD-10-CM | POA: Diagnosis not present

## 2016-08-06 DIAGNOSIS — I200 Unstable angina: Secondary | ICD-10-CM | POA: Diagnosis not present

## 2016-08-06 DIAGNOSIS — Z9884 Bariatric surgery status: Secondary | ICD-10-CM | POA: Diagnosis not present

## 2016-08-06 DIAGNOSIS — S82102S Unspecified fracture of upper end of left tibia, sequela: Secondary | ICD-10-CM | POA: Diagnosis not present

## 2016-08-17 DIAGNOSIS — M1712 Unilateral primary osteoarthritis, left knee: Secondary | ICD-10-CM | POA: Diagnosis not present

## 2016-08-17 DIAGNOSIS — S82202A Unspecified fracture of shaft of left tibia, initial encounter for closed fracture: Secondary | ICD-10-CM | POA: Diagnosis not present

## 2016-08-17 DIAGNOSIS — M170 Bilateral primary osteoarthritis of knee: Secondary | ICD-10-CM | POA: Diagnosis not present

## 2016-08-17 DIAGNOSIS — Z6838 Body mass index (BMI) 38.0-38.9, adult: Secondary | ICD-10-CM | POA: Diagnosis not present

## 2016-08-20 DIAGNOSIS — F411 Generalized anxiety disorder: Secondary | ICD-10-CM | POA: Diagnosis not present

## 2016-08-20 DIAGNOSIS — I10 Essential (primary) hypertension: Secondary | ICD-10-CM | POA: Diagnosis not present

## 2016-08-20 DIAGNOSIS — E1140 Type 2 diabetes mellitus with diabetic neuropathy, unspecified: Secondary | ICD-10-CM | POA: Diagnosis not present

## 2016-08-20 DIAGNOSIS — F331 Major depressive disorder, recurrent, moderate: Secondary | ICD-10-CM | POA: Diagnosis not present

## 2016-08-21 DIAGNOSIS — M5417 Radiculopathy, lumbosacral region: Secondary | ICD-10-CM | POA: Diagnosis not present

## 2016-08-21 DIAGNOSIS — M25562 Pain in left knee: Secondary | ICD-10-CM | POA: Diagnosis not present

## 2016-08-21 DIAGNOSIS — G894 Chronic pain syndrome: Secondary | ICD-10-CM | POA: Diagnosis not present

## 2016-08-21 DIAGNOSIS — M79605 Pain in left leg: Secondary | ICD-10-CM | POA: Diagnosis not present

## 2016-08-31 DIAGNOSIS — M545 Low back pain: Secondary | ICD-10-CM | POA: Diagnosis not present

## 2016-09-18 DIAGNOSIS — G894 Chronic pain syndrome: Secondary | ICD-10-CM | POA: Diagnosis not present

## 2016-09-18 DIAGNOSIS — M79605 Pain in left leg: Secondary | ICD-10-CM | POA: Diagnosis not present

## 2016-09-18 DIAGNOSIS — M25572 Pain in left ankle and joints of left foot: Secondary | ICD-10-CM | POA: Diagnosis not present

## 2016-09-18 DIAGNOSIS — M25562 Pain in left knee: Secondary | ICD-10-CM | POA: Diagnosis not present

## 2016-09-30 DIAGNOSIS — F411 Generalized anxiety disorder: Secondary | ICD-10-CM | POA: Diagnosis not present

## 2016-09-30 DIAGNOSIS — K5900 Constipation, unspecified: Secondary | ICD-10-CM | POA: Diagnosis not present

## 2016-09-30 DIAGNOSIS — F331 Major depressive disorder, recurrent, moderate: Secondary | ICD-10-CM | POA: Diagnosis not present

## 2016-09-30 DIAGNOSIS — I10 Essential (primary) hypertension: Secondary | ICD-10-CM | POA: Diagnosis not present

## 2016-10-05 DIAGNOSIS — M545 Low back pain: Secondary | ICD-10-CM | POA: Diagnosis not present

## 2016-10-12 DIAGNOSIS — E291 Testicular hypofunction: Secondary | ICD-10-CM | POA: Diagnosis not present

## 2016-10-12 DIAGNOSIS — N401 Enlarged prostate with lower urinary tract symptoms: Secondary | ICD-10-CM | POA: Diagnosis not present

## 2016-10-12 DIAGNOSIS — Z6838 Body mass index (BMI) 38.0-38.9, adult: Secondary | ICD-10-CM | POA: Diagnosis not present

## 2016-10-13 ENCOUNTER — Telehealth: Payer: Self-pay | Admitting: Gastroenterology

## 2016-10-13 NOTE — Telephone Encounter (Signed)
Patient LVM that  He received a letter from his insurance company that he is due for a colonoscopy.

## 2016-10-14 ENCOUNTER — Other Ambulatory Visit: Payer: Self-pay

## 2016-10-14 DIAGNOSIS — Z1211 Encounter for screening for malignant neoplasm of colon: Secondary | ICD-10-CM

## 2016-10-14 NOTE — Telephone Encounter (Signed)
Pts insurance company sent him a letter indicating time for colonoscopy.  He has been scheduled screening colonoscopy with Dr. Allen Norris on 12/22/16.  See triage below.  Gastroenterology Pre-Procedure Review  Request Date: 12/22/16 Requesting Physician: Dr. Allen Norris  PATIENT REVIEW QUESTIONS: The patient responded to the following health history questions as indicated:    1. Are you having any GI issues? no 2. Do you have a personal history of Polyps? yes (2010) 3. Do you have a family history of Colon Cancer or Polyps? no 4. Diabetes Mellitus? no 5. Joint replacements in the past 12 months?no 6. Major health problems in the past 3 months?no 7. Any artificial heart valves, MVP, or defibrillator?no    MEDICATIONS & ALLERGIES:    Patient reports the following regarding taking any anticoagulation/antiplatelet therapy:   Plavix, Coumadin, Eliquis, Xarelto, Lovenox, Pradaxa, Brilinta, or Effient? no Aspirin? no  Patient confirms/reports the following medications:  Current Outpatient Prescriptions  Medication Sig Dispense Refill  . acidophilus (RISAQUAD) CAPS capsule Take 1 capsule by mouth daily.    Marland Kitchen albuterol (PROVENTIL HFA;VENTOLIN HFA) 108 (90 Base) MCG/ACT inhaler Inhale 2 puffs into the lungs every 6 (six) hours as needed for wheezing or shortness of breath. 1 Inhaler 0  . ALPRAZolam (XANAX) 1 MG tablet Take 1 mg by mouth 3 (three) times daily.     Marland Kitchen atorvastatin (LIPITOR) 40 MG tablet Take 1 tablet (40 mg total) by mouth daily at 6 PM. 30 tablet 0  . busPIRone (BUSPAR) 15 MG tablet Take 30 mg by mouth 2 (two) times daily.     . Cyanocobalamin (B-12 PO) Take 1 tablet by mouth daily.    . DULoxetine (CYMBALTA) 60 MG capsule Take 60 mg by mouth daily.    Marland Kitchen gabapentin (NEURONTIN) 300 MG capsule Take 600 mg by mouth 6 (six) times daily.    Marland Kitchen levothyroxine (SYNTHROID, LEVOTHROID) 75 MCG tablet Take 75 mcg by mouth daily before breakfast.     . morphine (MS CONTIN) 15 MG 12 hr tablet Take 15-30  mg by mouth every 12 (twelve) hours. Pt takes two tablets in the morning and one at night.    . Multiple Vitamin (MULTIVITAMIN WITH MINERALS) TABS tablet Take 1 tablet by mouth daily.    Marland Kitchen omeprazole (PRILOSEC) 20 MG capsule Take 20 mg by mouth daily.     . Oxycodone HCl 10 MG TABS Take 10 mg by mouth 6 (six) times daily.    . sildenafil (REVATIO) 20 MG tablet Take 20 mg by mouth 3 (three) times daily.    Marland Kitchen testosterone cypionate (DEPOTESTOSTERONE CYPIONATE) 200 MG/ML injection Inject 150 mg into the muscle every 14 (fourteen) days.    . ticagrelor (BRILINTA) 90 MG TABS tablet Take by mouth 2 (two) times daily.    Marland Kitchen zolpidem (AMBIEN) 10 MG tablet Take 10 mg by mouth at bedtime.     No current facility-administered medications for this visit.     Patient confirms/reports the following allergies:  Allergies  Allergen Reactions  . Lisinopril Cough    No orders of the defined types were placed in this encounter.   AUTHORIZATION INFORMATION Primary Insurance: 1D#: Group #:  Secondary Insurance: 1D#: Group #:  SCHEDULE INFORMATION: Date: 12/22/16 Time: Location:[facility]

## 2016-10-15 ENCOUNTER — Other Ambulatory Visit: Payer: Self-pay

## 2016-10-15 DIAGNOSIS — Z1211 Encounter for screening for malignant neoplasm of colon: Secondary | ICD-10-CM

## 2016-10-19 DIAGNOSIS — G894 Chronic pain syndrome: Secondary | ICD-10-CM | POA: Diagnosis not present

## 2016-10-19 DIAGNOSIS — M25562 Pain in left knee: Secondary | ICD-10-CM | POA: Diagnosis not present

## 2016-10-19 DIAGNOSIS — M545 Low back pain: Secondary | ICD-10-CM | POA: Diagnosis not present

## 2016-10-19 DIAGNOSIS — M79605 Pain in left leg: Secondary | ICD-10-CM | POA: Diagnosis not present

## 2016-11-04 IMAGING — CT CT ANGIO CHEST
1 of 2 series · 18 of 30 positions shown · IV contrast (APPLIED)
Comparison: None.

CLINICAL DATA: Chest pain today.  Stent placed in [REDACTED].

EXAM:
CT ANGIOGRAPHY CHEST WITH CONTRAST
TECHNIQUE: Multidetector CT imaging of the chest was performed using the
standard protocol during bolus administration of intravenous
contrast. Multiplanar CT image reconstructions and MIPs were
obtained to evaluate the vascular anatomy.
CONTRAST:  100mL OMNIPAQUE IOHEXOL 350 MG/ML SOLN

[Series 5: pe 1.0 thins · axial · 0.89mm/px · z∈[-332,-92]mm · 18 of 270 slices shown]
[im 15/270  lung]
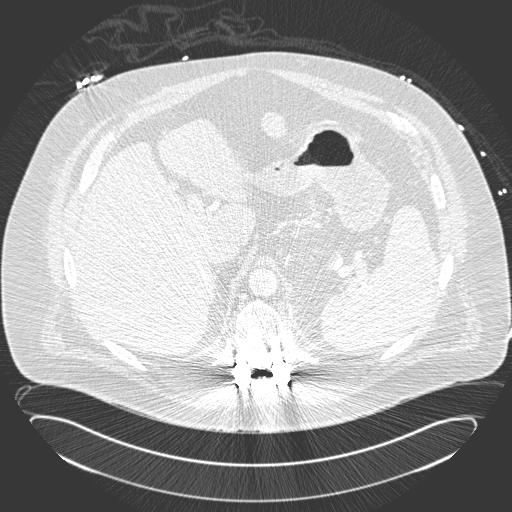
[im 30/270  mediastinal]
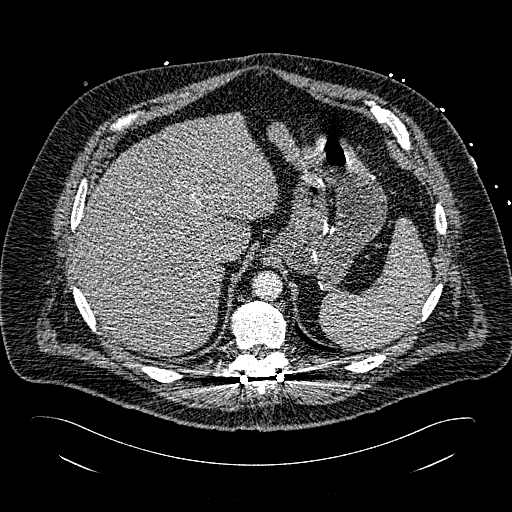
[im 45/270  lung]
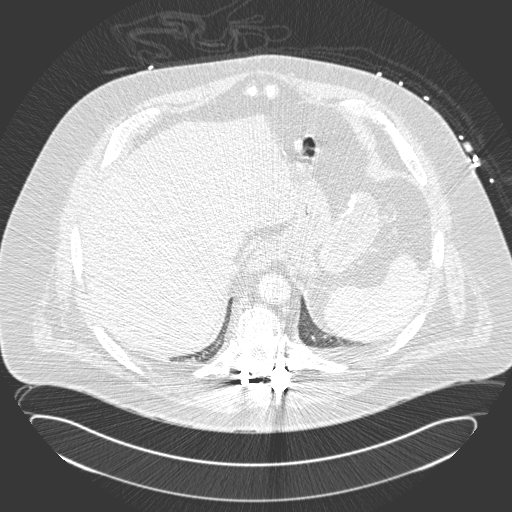
[im 60/270  mediastinal]
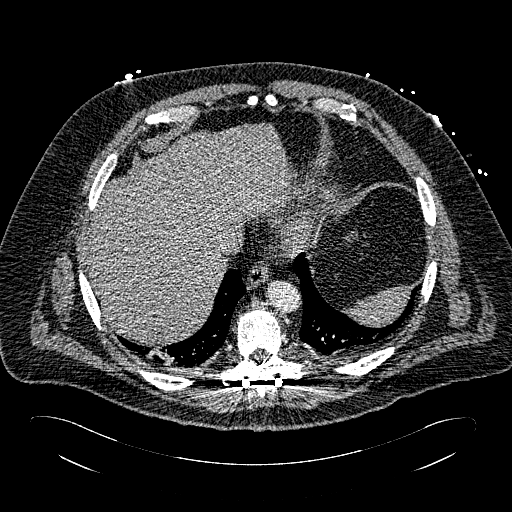
[im 75/270  lung]
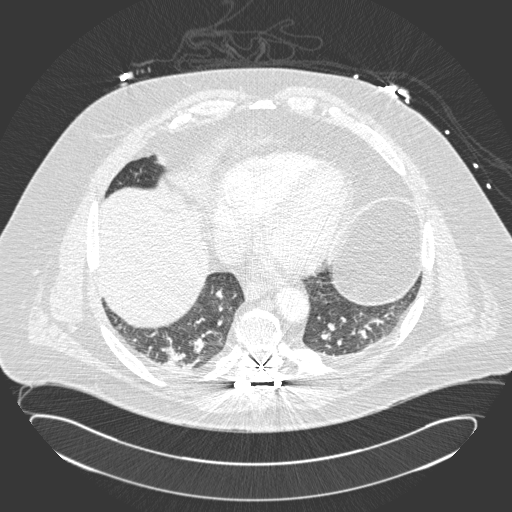
[im 90/270  mediastinal]
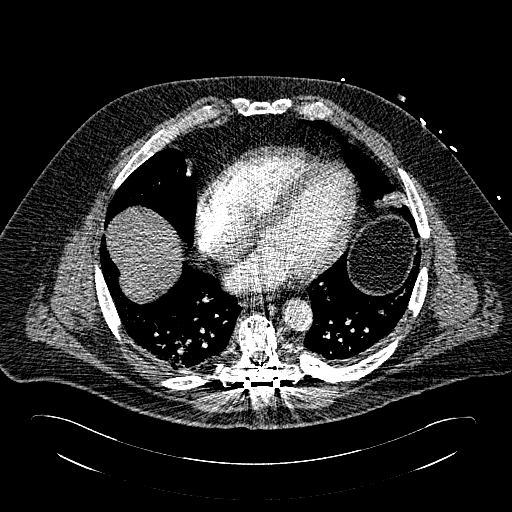
[im 105/270  lung]
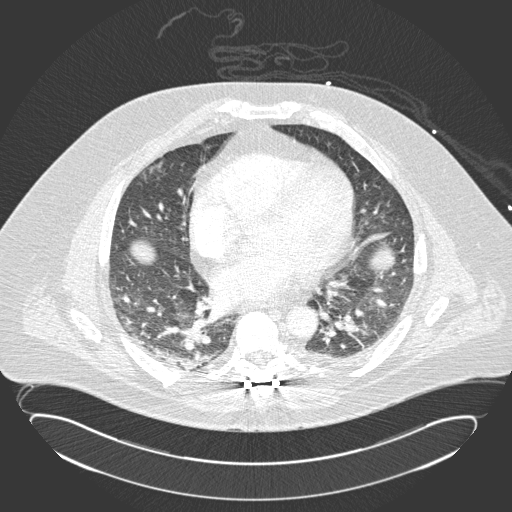
[im 120/270  mediastinal]
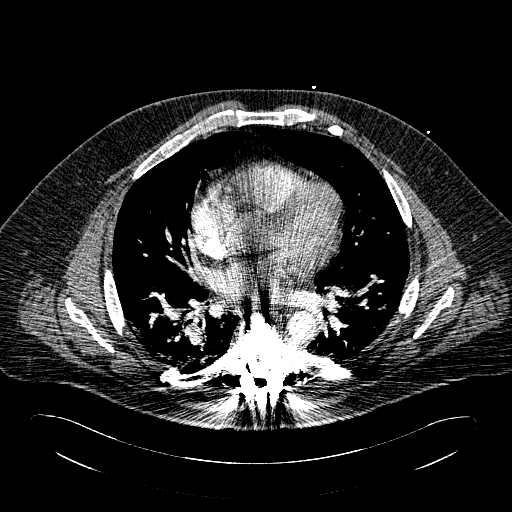
[im 126/270  lung]
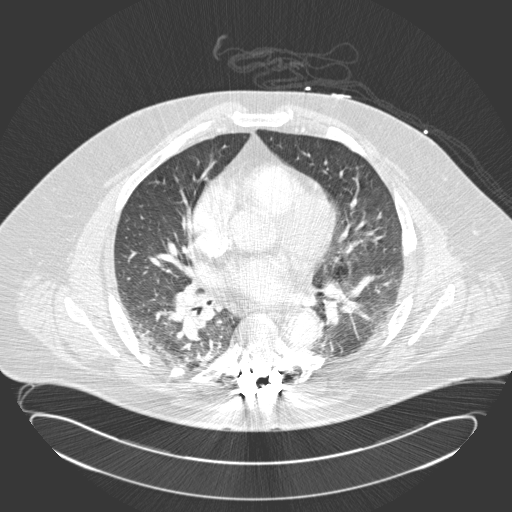
[im 135/270  mediastinal]
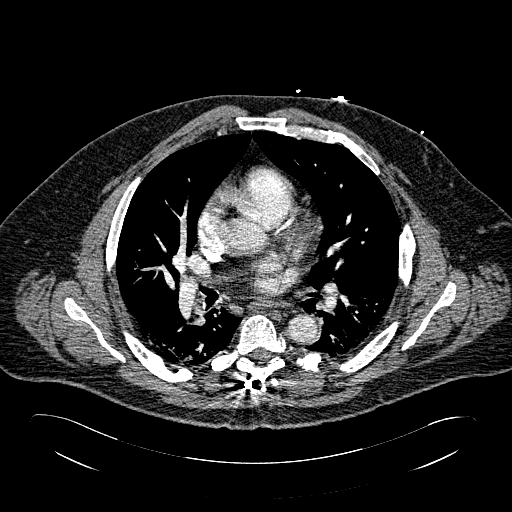
[im 150/270  lung]
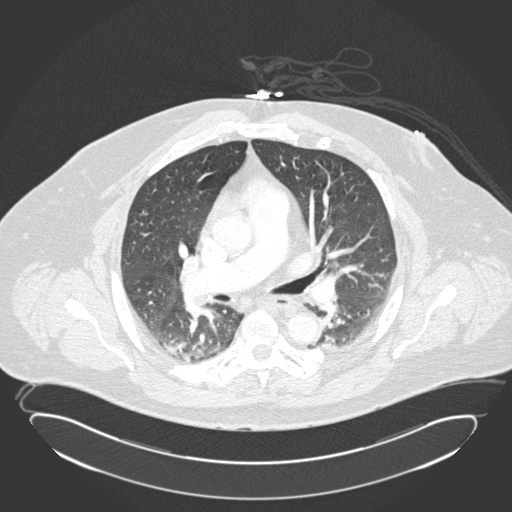
[im 165/270  mediastinal]
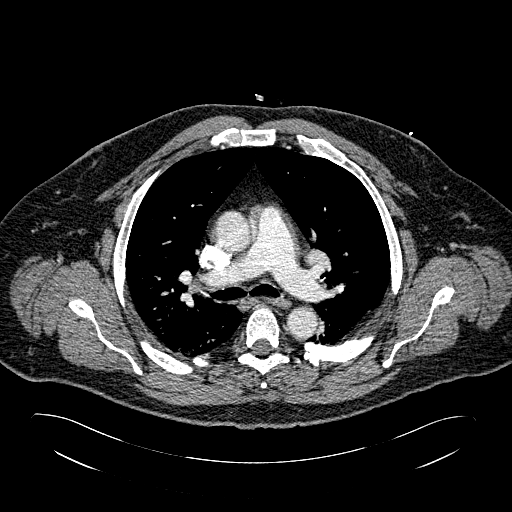
[im 180/270  lung]
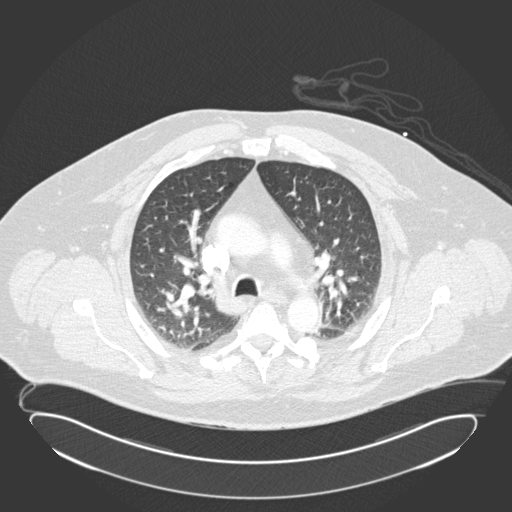
[im 195/270  mediastinal]
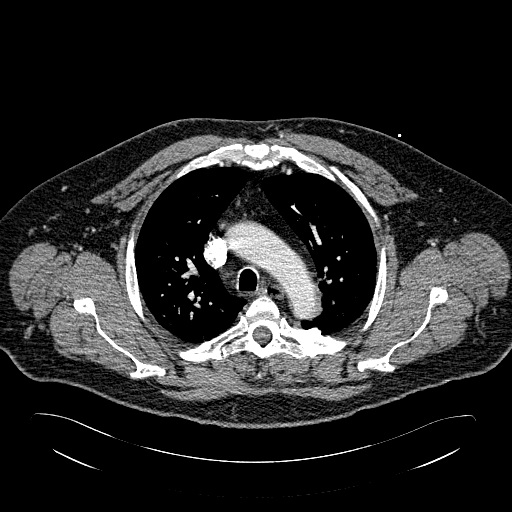
[im 210/270  lung]
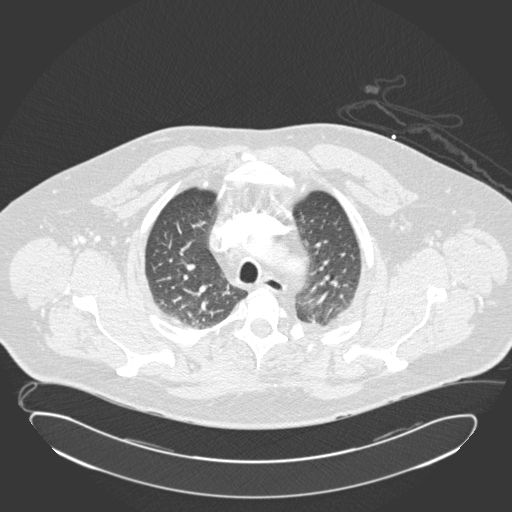
[im 225/270  mediastinal]
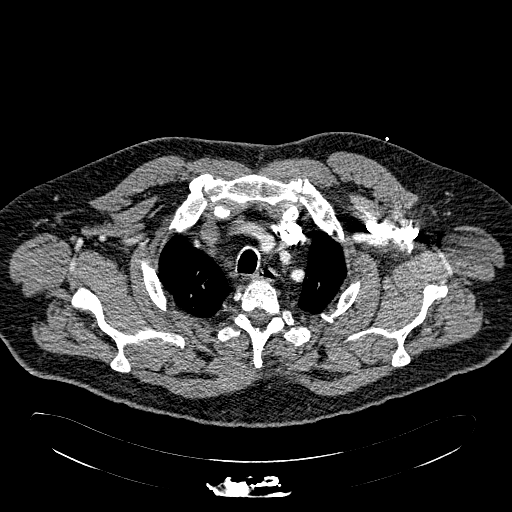
[im 240/270  lung]
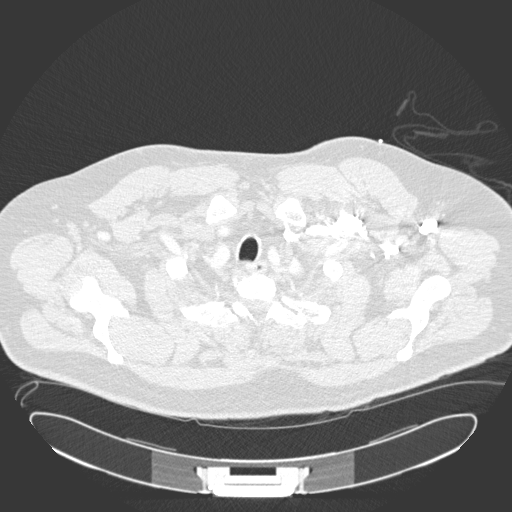
[im 255/270  mediastinal]
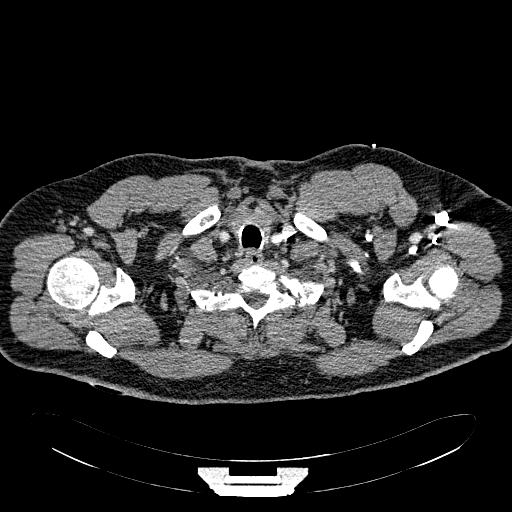

[18 of 30 positions shown; findings below may reference images not displayed]

FINDINGS: Suboptimal contrast bolus precludes evaluation of segmental and
distal pulmonary arteries. However, there is no significant filling
defect in the central pulmonary arteries to suggest large pulmonary
embolus.

Normal heart size. Normal caliber thoracic aorta. Coronary artery
stent. Great vessel origins are patent. Esophagus is decompressed.
No significant lymphadenopathy in the chest.

Evaluation of lungs is limited due to respiratory motion artifact.
There are presumed atelectatic changes in the lung bases. No focal
consolidation. Peribronchial thickening may represent chronic
bronchitis. No pleural effusions. No pneumothorax.

Included portions of the upper abdominal organs demonstrate
postoperative changes consistent with gastric bypass. Postoperative
changes in the thoracolumbar spine with posterior fixation.
Bilateral rib deformities consistent with old fractures.

Review of the MIP images confirms the above findings.
IMPRESSION: Suboptimal contrast bolus limits evaluation of the pulmonary
arteries but no large central pulmonary emboli are visualized. No
evidence of active pulmonary disease. Peribronchial thickening
suggesting chronic bronchitis.

## 2016-11-06 DIAGNOSIS — L089 Local infection of the skin and subcutaneous tissue, unspecified: Secondary | ICD-10-CM | POA: Diagnosis not present

## 2016-11-06 DIAGNOSIS — E1140 Type 2 diabetes mellitus with diabetic neuropathy, unspecified: Secondary | ICD-10-CM | POA: Diagnosis not present

## 2016-11-06 DIAGNOSIS — M21611 Bunion of right foot: Secondary | ICD-10-CM | POA: Diagnosis not present

## 2016-11-06 DIAGNOSIS — I10 Essential (primary) hypertension: Secondary | ICD-10-CM | POA: Diagnosis not present

## 2016-11-09 DIAGNOSIS — G894 Chronic pain syndrome: Secondary | ICD-10-CM | POA: Diagnosis not present

## 2016-11-09 DIAGNOSIS — M545 Low back pain: Secondary | ICD-10-CM | POA: Diagnosis not present

## 2016-11-17 ENCOUNTER — Emergency Department: Payer: BLUE CROSS/BLUE SHIELD

## 2016-11-17 ENCOUNTER — Observation Stay
Admission: EM | Admit: 2016-11-17 | Discharge: 2016-11-19 | Disposition: A | Discharge status: Home / Self Care | Payer: BLUE CROSS/BLUE SHIELD | Attending: Internal Medicine | Admitting: Internal Medicine

## 2016-11-17 ENCOUNTER — Encounter: Payer: Self-pay | Admitting: Emergency Medicine

## 2016-11-17 DIAGNOSIS — F419 Anxiety disorder, unspecified: Secondary | ICD-10-CM | POA: Insufficient documentation

## 2016-11-17 DIAGNOSIS — Z79899 Other long term (current) drug therapy: Secondary | ICD-10-CM | POA: Insufficient documentation

## 2016-11-17 DIAGNOSIS — I10 Essential (primary) hypertension: Secondary | ICD-10-CM | POA: Diagnosis not present

## 2016-11-17 DIAGNOSIS — I2510 Atherosclerotic heart disease of native coronary artery without angina pectoris: Secondary | ICD-10-CM | POA: Diagnosis not present

## 2016-11-17 DIAGNOSIS — Z7982 Long term (current) use of aspirin: Secondary | ICD-10-CM | POA: Insufficient documentation

## 2016-11-17 DIAGNOSIS — Z9989 Dependence on other enabling machines and devices: Secondary | ICD-10-CM | POA: Diagnosis not present

## 2016-11-17 DIAGNOSIS — E039 Hypothyroidism, unspecified: Secondary | ICD-10-CM | POA: Diagnosis not present

## 2016-11-17 DIAGNOSIS — M1990 Unspecified osteoarthritis, unspecified site: Secondary | ICD-10-CM | POA: Diagnosis not present

## 2016-11-17 DIAGNOSIS — R0789 Other chest pain: Secondary | ICD-10-CM | POA: Diagnosis not present

## 2016-11-17 DIAGNOSIS — Z981 Arthrodesis status: Secondary | ICD-10-CM | POA: Diagnosis not present

## 2016-11-17 DIAGNOSIS — Z6839 Body mass index (BMI) 39.0-39.9, adult: Secondary | ICD-10-CM | POA: Insufficient documentation

## 2016-11-17 DIAGNOSIS — Z1211 Encounter for screening for malignant neoplasm of colon: Secondary | ICD-10-CM

## 2016-11-17 DIAGNOSIS — R079 Chest pain, unspecified: Secondary | ICD-10-CM | POA: Diagnosis not present

## 2016-11-17 DIAGNOSIS — G4733 Obstructive sleep apnea (adult) (pediatric): Secondary | ICD-10-CM | POA: Diagnosis not present

## 2016-11-17 DIAGNOSIS — G894 Chronic pain syndrome: Secondary | ICD-10-CM | POA: Diagnosis not present

## 2016-11-17 DIAGNOSIS — I252 Old myocardial infarction: Secondary | ICD-10-CM | POA: Diagnosis not present

## 2016-11-17 DIAGNOSIS — G8929 Other chronic pain: Secondary | ICD-10-CM | POA: Diagnosis not present

## 2016-11-17 DIAGNOSIS — Z955 Presence of coronary angioplasty implant and graft: Secondary | ICD-10-CM | POA: Insufficient documentation

## 2016-11-17 DIAGNOSIS — G4700 Insomnia, unspecified: Secondary | ICD-10-CM | POA: Insufficient documentation

## 2016-11-17 DIAGNOSIS — Z888 Allergy status to other drugs, medicaments and biological substances status: Secondary | ICD-10-CM | POA: Diagnosis not present

## 2016-11-17 DIAGNOSIS — I25119 Atherosclerotic heart disease of native coronary artery with unspecified angina pectoris: Secondary | ICD-10-CM | POA: Diagnosis not present

## 2016-11-17 DIAGNOSIS — F329 Major depressive disorder, single episode, unspecified: Secondary | ICD-10-CM | POA: Diagnosis not present

## 2016-11-17 DIAGNOSIS — Z9884 Bariatric surgery status: Secondary | ICD-10-CM | POA: Diagnosis not present

## 2016-11-17 DIAGNOSIS — K219 Gastro-esophageal reflux disease without esophagitis: Secondary | ICD-10-CM | POA: Insufficient documentation

## 2016-11-17 DIAGNOSIS — Z8711 Personal history of peptic ulcer disease: Secondary | ICD-10-CM | POA: Insufficient documentation

## 2016-11-17 LAB — TROPONIN I
Troponin I: <0.03 ng/mL (ref <0.03)
Troponin I: <0.03 ng/mL (ref <0.03)

## 2016-11-17 LAB — CBC WITH DIFFERENTIAL/PLATELET
BASOS PCT: 1 %
Basophils Absolute: 0.1 10*3/uL (ref 0–0.1)
Eosinophils Absolute: 0.1 10*3/uL (ref 0–0.7)
Eosinophils Relative: 1 %
HEMATOCRIT: 51.8 % (ref 40.0–52.0)
HEMOGLOBIN: 17.6 g/dL (ref 13.0–18.0)
Lymphocytes Relative: 27 %
Lymphs Abs: 2.3 10*3/uL (ref 1.0–3.6)
MCH: 29.2 pg (ref 26.0–34.0)
MCHC: 33.9 g/dL (ref 32.0–36.0)
MCV: 86.2 fL (ref 80.0–100.0)
MONOS PCT: 6 %
Monocytes Absolute: 0.5 10*3/uL (ref 0.2–1.0)
NEUTROS ABS: 5.4 10*3/uL (ref 1.4–6.5)
NEUTROS PCT: 65 %
Platelets: 273 10*3/uL (ref 150–440)
RBC: 6.02 MIL/uL — ABNORMAL HIGH (ref 4.40–5.90)
RDW: 16.4 % — ABNORMAL HIGH (ref 11.5–14.5)
WBC: 8.4 10*3/uL (ref 3.8–10.6)

## 2016-11-17 LAB — BASIC METABOLIC PANEL
Anion gap: 9 (ref 5–15)
BUN: 14 mg/dL (ref 6–20)
CHLORIDE: 103 mmol/L (ref 101–111)
CO2: 26 mmol/L (ref 22–32)
CREATININE: 0.9 mg/dL (ref 0.61–1.24)
Calcium: 9.1 mg/dL (ref 8.9–10.3)
GFR calc non Af Amer: >60 mL/min (ref >60)
Glucose, Bld: 109 mg/dL — ABNORMAL HIGH (ref 65–99)
Potassium: 4.1 mmol/L (ref 3.5–5.1)
Sodium: 138 mmol/L (ref 135–145)

## 2016-11-17 MED ORDER — LEVOTHYROXINE SODIUM 50 MCG PO TABS
75.0000 ug | ORAL_TABLET | Freq: Every day | ORAL | Status: DC
  Administered 2016-11-18 – 2016-11-19 (×2): 75 ug via ORAL
  Filled 2016-11-17 (×2): qty 1

## 2016-11-17 MED ORDER — ADULT MULTIVITAMIN W/MINERALS CH
1.0000 | ORAL_TABLET | Freq: Every day | ORAL | Status: DC
  Administered 2016-11-18 – 2016-11-19 (×2): 1 via ORAL
  Filled 2016-11-17 (×2): qty 1

## 2016-11-17 MED ORDER — ONDANSETRON HCL 4 MG/2ML IJ SOLN
4.0000 mg | Freq: Four times a day (QID) | INTRAMUSCULAR | Status: DC | PRN
  Administered 2016-11-17: 4 mg via INTRAVENOUS
  Filled 2016-11-17: qty 2

## 2016-11-17 MED ORDER — BUSPIRONE HCL 10 MG PO TABS
30.0000 mg | ORAL_TABLET | Freq: Two times a day (BID) | ORAL | Status: DC
  Administered 2016-11-17 – 2016-11-19 (×5): 30 mg via ORAL
  Filled 2016-11-17: qty 3
  Filled 2016-11-17: qty 6
  Filled 2016-11-17 (×3): qty 3
  Filled 2016-11-17: qty 6
  Filled 2016-11-17: qty 3
  Filled 2016-11-17: qty 6

## 2016-11-17 MED ORDER — TICAGRELOR 90 MG PO TABS
90.0000 mg | ORAL_TABLET | Freq: Two times a day (BID) | ORAL | Status: DC
  Administered 2016-11-17 – 2016-11-19 (×4): 90 mg via ORAL
  Filled 2016-11-17 (×4): qty 1

## 2016-11-17 MED ORDER — ONDANSETRON HCL 4 MG/2ML IJ SOLN
4.0000 mg | Freq: Once | INTRAMUSCULAR | Status: AC
  Administered 2016-11-17: 4 mg via INTRAVENOUS
  Filled 2016-11-17: qty 2

## 2016-11-17 MED ORDER — ATORVASTATIN CALCIUM 20 MG PO TABS
20.0000 mg | ORAL_TABLET | Freq: Every day | ORAL | Status: DC
  Administered 2016-11-17 – 2016-11-18 (×2): 20 mg via ORAL
  Filled 2016-11-17 (×2): qty 1

## 2016-11-17 MED ORDER — ZOLPIDEM TARTRATE 5 MG PO TABS
10.0000 mg | ORAL_TABLET | Freq: Every day | ORAL | Status: DC
  Administered 2016-11-17 – 2016-11-18 (×2): 10 mg via ORAL
  Filled 2016-11-17 (×2): qty 2

## 2016-11-17 MED ORDER — ALPRAZOLAM 1 MG PO TABS
1.0000 mg | ORAL_TABLET | Freq: Three times a day (TID) | ORAL | Status: DC
  Administered 2016-11-17 – 2016-11-19 (×4): 1 mg via ORAL
  Filled 2016-11-17 (×4): qty 1

## 2016-11-17 MED ORDER — ALBUTEROL SULFATE HFA 108 (90 BASE) MCG/ACT IN AERS: 2.0000 | INHALATION_SPRAY | Freq: Four times a day (QID) | RESPIRATORY_TRACT | Status: DC | PRN

## 2016-11-17 MED ORDER — PANTOPRAZOLE SODIUM 40 MG PO TBEC
40.0000 mg | DELAYED_RELEASE_TABLET | Freq: Every day | ORAL | Status: DC
  Administered 2016-11-17 – 2016-11-19 (×3): 40 mg via ORAL
  Filled 2016-11-17 (×3): qty 1

## 2016-11-17 MED ORDER — MORPHINE SULFATE (PF) 2 MG/ML IV SOLN
2.0000 mg | INTRAVENOUS | Status: DC | PRN
  Administered 2016-11-17 – 2016-11-18 (×2): 2 mg via INTRAVENOUS
  Filled 2016-11-17 (×2): qty 1

## 2016-11-17 MED ORDER — NITROGLYCERIN 0.4 MG SL SUBL
0.4000 mg | SUBLINGUAL_TABLET | SUBLINGUAL | Status: DC | PRN
  Administered 2016-11-17 (×2): 0.4 mg via SUBLINGUAL

## 2016-11-17 MED ORDER — ACETAMINOPHEN 325 MG PO TABS: 650.0000 mg | ORAL_TABLET | Freq: Four times a day (QID) | ORAL | Status: DC | PRN

## 2016-11-17 MED ORDER — MORPHINE SULFATE ER 30 MG PO TBCR
30.0000 mg | EXTENDED_RELEASE_TABLET | Freq: Every morning | ORAL | Status: DC
  Administered 2016-11-18 – 2016-11-19 (×2): 30 mg via ORAL
  Filled 2016-11-17 (×2): qty 1

## 2016-11-17 MED ORDER — TESTOSTERONE UNDECANOATE 750 MG/3ML IM SOLN
750.0000 mg | INTRAMUSCULAR | Status: DC
Start: 2016-11-17 — End: 2016-11-17

## 2016-11-17 MED ORDER — DULOXETINE HCL 30 MG PO CPEP
60.0000 mg | ORAL_CAPSULE | Freq: Every day | ORAL | Status: DC
Start: 2016-11-18 — End: 2016-11-19
  Administered 2016-11-18 – 2016-11-19 (×2): 60 mg via ORAL
  Filled 2016-11-17 (×2): qty 2

## 2016-11-17 MED ORDER — ONDANSETRON HCL 4 MG PO TABS: 4.0000 mg | ORAL_TABLET | Freq: Four times a day (QID) | ORAL | Status: DC | PRN

## 2016-11-17 MED ORDER — ENOXAPARIN SODIUM 40 MG/0.4ML ~~LOC~~ SOLN
40.0000 mg | SUBCUTANEOUS | Status: DC
  Administered 2016-11-17 – 2016-11-18 (×2): 40 mg via SUBCUTANEOUS
  Filled 2016-11-17 (×2): qty 0.4

## 2016-11-17 MED ORDER — MORPHINE SULFATE ER 15 MG PO TBCR
15.0000 mg | EXTENDED_RELEASE_TABLET | Freq: Every day | ORAL | Status: DC
  Administered 2016-11-17 – 2016-11-18 (×2): 15 mg via ORAL
  Filled 2016-11-17 (×2): qty 1

## 2016-11-17 MED ORDER — ACETAMINOPHEN 650 MG RE SUPP: 650.0000 mg | Freq: Four times a day (QID) | RECTAL | Status: DC | PRN

## 2016-11-17 MED ORDER — AMLODIPINE BESYLATE 5 MG PO TABS
5.0000 mg | ORAL_TABLET | Freq: Every day | ORAL | Status: DC
  Administered 2016-11-18 – 2016-11-19 (×2): 5 mg via ORAL
  Filled 2016-11-17 (×3): qty 1

## 2016-11-17 MED ORDER — MORPHINE SULFATE (PF) 4 MG/ML IV SOLN
4.0000 mg | Freq: Once | INTRAVENOUS | Status: AC
  Administered 2016-11-17: 4 mg via INTRAVENOUS
  Filled 2016-11-17: qty 1

## 2016-11-17 MED ORDER — OXYCODONE HCL 5 MG PO TABS
10.0000 mg | ORAL_TABLET | Freq: Every day | ORAL | Status: DC
  Administered 2016-11-17 – 2016-11-19 (×9): 10 mg via ORAL
  Filled 2016-11-17 (×9): qty 2

## 2016-11-17 MED ORDER — RISAQUAD PO CAPS
1.0000 | ORAL_CAPSULE | Freq: Every day | ORAL | Status: DC
  Administered 2016-11-18 – 2016-11-19 (×2): 1 via ORAL
  Filled 2016-11-17 (×2): qty 1

## 2016-11-17 MED ORDER — SODIUM CHLORIDE 0.9 % IV SOLN
INTRAVENOUS | Status: DC
  Administered 2016-11-17: 17:00:00 via INTRAVENOUS

## 2016-11-17 MED ORDER — SENNOSIDES-DOCUSATE SODIUM 8.6-50 MG PO TABS: 1.0000 | ORAL_TABLET | Freq: Every evening | ORAL | Status: DC | PRN

## 2016-11-17 MED ORDER — GABAPENTIN 300 MG PO CAPS
600.0000 mg | ORAL_CAPSULE | Freq: Every day | ORAL | Status: DC
  Administered 2016-11-17 – 2016-11-19 (×9): 600 mg via ORAL
  Filled 2016-11-17 (×9): qty 2

## 2016-11-17 MED ORDER — NITROGLYCERIN 0.4 MG SL SUBL
SUBLINGUAL_TABLET | SUBLINGUAL | Status: AC
  Administered 2016-11-17
  Filled 2016-11-17: qty 1

## 2016-11-17 MED ORDER — MORPHINE SULFATE ER 15 MG PO TBCR: 15.0000 mg | EXTENDED_RELEASE_TABLET | Freq: Two times a day (BID) | ORAL | Status: DC

## 2016-11-17 MED ORDER — ALBUTEROL SULFATE (2.5 MG/3ML) 0.083% IN NEBU: 2.5000 mg | INHALATION_SOLUTION | Freq: Four times a day (QID) | RESPIRATORY_TRACT | Status: DC | PRN

## 2016-11-17 NOTE — ED Triage Notes (Signed)
Pt to ed via ems from work, with c/o chest pain acute onset at 63.  Pt states chest pain in center of chest and left side of chest, non radiating.  Pt states pain feel like someone is sitting on his chest.  Pt reports pain worse with palpation of chest, but does not get worse with movement of left arm or chest.  Pt denies sob, denies diaphoresis, denies n/v.

## 2016-11-17 NOTE — Consult Note (Signed)
[facility] Cardiology  CARDIOLOGY CONSULT NOTE  Patient ID: Charles [name] MRN: [id number] DOB/AGE: Jun 24, 1960 56 y.o.  Admit date: 11/17/2016 Referring Physician Mody Primary Physician [facility] Cardiologist Nehemiah Massed Reason for Consultation Chest pain  HPI: 56 year old gentleman referred for evaluation of chest pain. Patient has known coronary artery disease, status post stent proximal LAD. Cardiac catheterization 06/25/2016 revealed patent stent proximal LAD. The patient reports intermittent episodes of left-sided chest pain. He also reports that during the past 2-3 weeks has had intermittent episodes of elevated blood pressure. Today, the patient experienced left-sided chest discomfort, rated 7 out of 10. He presented to [facility] emergency room where ECG was nondiagnostic. He ruled out for myocardial infarction with negative troponin 2.  Review of systems complete and found to be negative unless listed above     Past Medical History:  Diagnosis Date  . Anginal pain ([place])   . Anxiety   . Arthritis   . Chronic back pain   . Coronary artery disease   . Depression   . GERD (gastroesophageal reflux disease)   . Hypertension   . Hypothyroidism   . Insomnia   . Low testosterone   . Myocardial infarction ([place])   . Peptic ulcer   . Shortness of breath   . Sleep apnea    has cpap-has not used since lost 140lb  . Spinal cord stimulator status    has a scs  . Wears glasses     Past Surgical History:  Procedure Laterality Date  . BACK SURGERY    . CARDIAC CATHETERIZATION N/A 06/27/2015   Procedure: Left Heart Cath and Coronary Angiography;  Surgeon: Dionisio David, MD;  Location: [place] [facility];  Service: Cardiovascular;  Laterality: N/A;  . CARDIAC CATHETERIZATION N/A 06/27/2015   Procedure: Coronary Stent Intervention;  Surgeon: Yolonda Kida, MD;  Location: [place] [facility];  Service: Cardiovascular;  Laterality: N/A;  . CLOSED REDUCTION NASAL FRACTURE  12/22/2011   Procedure:  CLOSED REDUCTION NASAL FRACTURE;  Surgeon: Ascencion Dike, MD;  Location: [place];  Service: ENT;  Laterality: N/A;  closed reduction of nasal fracture  . FACIAL FRACTURE SURGERY     face-upper jaw with dental implants  . GASTRIC BYPASS  2011   has lost 140lb  . LEFT HEART CATH AND CORONARY ANGIOGRAPHY N/A 06/25/2016   Procedure: Left Heart Cath and Coronary Angiography;  Surgeon: Corey Skains, MD;  Location: [place] [facility];  Service: Cardiovascular;  Laterality: N/A;  . REPAIR TENDONS FOOT  2002   rt foot  . SPINAL CORD STIMULATOR IMPLANT  6/13    Prescriptions Prior to Admission  Medication Sig Dispense Refill Last Dose  . acidophilus (RISAQUAD) CAPS capsule Take 1 capsule by mouth daily.   11/17/2016 at Unknown time  . albuterol (PROVENTIL HFA;VENTOLIN HFA) 108 (90 Base) MCG/ACT inhaler Inhale 2 puffs into the lungs every 6 (six) hours as needed for wheezing or shortness of breath. 1 Inhaler 0 PRN at PRN  . amLODipine (NORVASC) 5 MG tablet Take 5 mg by mouth daily.   11/17/2016 at 0800  . atorvastatin (LIPITOR) 40 MG tablet Take 1 tablet (40 mg total) by mouth daily at 6 PM. (Patient taking differently: Take 20 mg by mouth daily at 6 PM. ) 30 tablet 0 11/16/2016 at 2000  . busPIRone (BUSPAR) 15 MG tablet Take 30 mg by mouth 2 (two) times daily.    11/17/2016 at 0700  . Cyanocobalamin (B-12 PO) Take 1  tablet by mouth daily.   11/17/2016 at Unknown time  . DULoxetine (CYMBALTA) 60 MG capsule Take 60 mg by mouth daily.   11/17/2016 at 0700  . gabapentin (NEURONTIN) 300 MG capsule Take 600 mg by mouth 6 (six) times daily.   11/17/2016 at 0700  . levothyroxine (SYNTHROID, LEVOTHROID) 75 MCG tablet Take 75 mcg by mouth daily before breakfast.    11/17/2016 at 0700  . morphine (MS CONTIN) 15 MG 12 hr tablet Take 15-30 mg by mouth every 12 (twelve) hours. Pt takes two tablets in the morning and one at night.   11/17/2016 at 0700  . Multiple Vitamin (MULTIVITAMIN WITH MINERALS) TABS  tablet Take 1 tablet by mouth daily.   11/17/2016 at 0700  . omeprazole (PRILOSEC) 20 MG capsule Take 20 mg by mouth daily.    11/17/2016 at 0700  . Oxycodone HCl 10 MG TABS Take 10 mg by mouth 6 (six) times daily.   11/17/2016 at 0700  . sildenafil (REVATIO) 20 MG tablet Take 20 mg by mouth 3 (three) times daily.   PRN at PRN  . Testosterone Undecanoate 750 MG/3ML SOLN Inject 750 mg into the muscle once a week.   Past Month at Unknown time  . zolpidem (AMBIEN) 10 MG tablet Take 10 mg by mouth at bedtime.   11/16/2016 at 2000  . ALPRAZolam (XANAX) 1 MG tablet Take 1 mg by mouth 3 (three) times daily.    Not Taking at Unknown time  . ticagrelor (BRILINTA) 90 MG TABS tablet Take by mouth 2 (two) times daily.   Not Taking at Unknown time   Social History   Social History  . Marital status: Married    Spouse name: N/A  . Number of children: N/A  . Years of education: N/A   Occupational History  . Not on file.   Social History Main Topics  . Smoking status: Never Smoker  . Smokeless tobacco: Never Used  . Alcohol use No     Comment: not in 4 yr  . Drug use: No  . Sexual activity: No   Other Topics Concern  . Not on file   Social History Narrative  . No narrative on file    Family History  Problem Relation Age of Onset  . Diabetes Father       Review of systems complete and found to be negative unless listed above      PHYSICAL EXAM  General: Well developed, well nourished, in no acute distress HEENT:  Normocephalic and atramatic Neck:  No JVD.  Lungs: Clear bilaterally to auscultation and percussion. Heart: HRRR . Normal S1 and S2 without gallops or murmurs.  Abdomen: Bowel sounds are positive, abdomen soft and non-tender  Msk:  Back normal, normal gait. Normal strength and tone for age. Extremities: No clubbing, cyanosis or edema.   Neuro: Alert and oriented X 3. Psych:  Good affect, responds appropriately  Labs:   Lab Results  Component Value Date   WBC 8.4  11/17/2016   HGB 17.6 11/17/2016   HCT 51.8 11/17/2016   MCV 86.2 11/17/2016   PLT 273 11/17/2016    Recent Labs Lab 11/17/16 1018  NA 138  K 4.1  CL 103  CO2 26  BUN 14  CREATININE 0.90  CALCIUM 9.1  GLUCOSE 109*   Lab Results  Component Value Date   CKMB 1.7 06/27/2015   TROPONINI <0.03 11/17/2016    Lab Results  Component Value Date   CHOL 193 06/26/2015  Lab Results  Component Value Date   HDL 25 (L) 06/26/2015   Lab Results  Component Value Date   LDLCALC 108 (H) 06/26/2015   Lab Results  Component Value Date   TRIG 301 (H) 06/26/2015   Lab Results  Component Value Date   CHOLHDL 7.7 06/26/2015   No results found for: LDLDIRECT    Radiology: Dg Chest 1 View  Result Date: 11/17/2016 CLINICAL DATA:  Chest pain. EXAM: CHEST 1 VIEW COMPARISON:  08/14/2015 FINDINGS: The heart size and pulmonary vascularity are normal and the lungs are clear. No acute bone abnormality. Thoracic spinal fusion with spinal cord stimulator in place, stable. Old bilateral rib fractures. Congenital bifid anterior left fourth rib. Old left clavicle fracture. IMPRESSION: No active disease. Electronically Signed   By: Lorriane Shire M.D.   On: 11/17/2016 10:38    EKG: Normal sinus rhythm without acute ischemic ST-T wave changes  ASSESSMENT AND PLAN:   1. Chest pain, nondiagnostic ECG, negative troponin 2, with recent cardiac catheterization 06/25/2016 revealing patent stent proximal LAD 2. Essential hypertension  Recommendations  1. Agree with current therapy 2. Defer for this anticoagulation 3. Lexiscan Myoview in a.m.  Signed: Isaias Cowman MD,PhD, [facility] 11/17/2016, 5:16 PM
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
## 2016-11-17 NOTE — ED Notes (Signed)
Family at bedside. 

## 2016-11-17 NOTE — H&P (Signed)
[facility] [place] NAME: Charles [name]    MR#:  [id number]  DATE OF BIRTH:  Jun 06, 1960  DATE OF ADMISSION:  11/17/2016  PRIMARY CARE PHYSICIAN: Lavera Guise, MD   REQUESTING/REFERRING PHYSICIAN: dr Dineen Kid  CHIEF COMPLAINT:   Chest pain HISTORY OF PRESENT ILLNESS:  Charles [name]  is a 56 y.o. male with a known history of CAD, Chronic pain syndrome and depression who presents with above complaint. Patient reports that this morning when he was driving to work he developed sudden onset of left substernal chest pain associated with shortness of breath and palpitations. He currently says his pain is 7 out of 10. He has a nitroglycerin patch now placed in the emergency room. His first set of troponins are negative. EKG shows no acute changes. He does report that his chest pain is similar to his previous heart attack in which she had stents placed. He denies any trauma or musculoskeletal issues.  PAST MEDICAL HISTORY:   Past Medical History:  Diagnosis Date  . Anginal pain ([place])   . Anxiety   . Arthritis   . Chronic back pain   . Coronary artery disease   . Depression   . GERD (gastroesophageal reflux disease)   . Hypertension   . Hypothyroidism   . Insomnia   . Low testosterone   . Myocardial infarction ([place])   . Peptic ulcer   . Shortness of breath   . Sleep apnea    has cpap-has not used since lost 140lb  . Spinal cord stimulator status    has a scs  . Wears glasses     PAST SURGICAL HISTORY:   Past Surgical History:  Procedure Laterality Date  . BACK SURGERY    . CARDIAC CATHETERIZATION N/A 06/27/2015   Procedure: Left Heart Cath and Coronary Angiography;  Surgeon: Dionisio David, MD;  Location: [place] [facility];  Service: Cardiovascular;  Laterality: N/A;  . CARDIAC CATHETERIZATION N/A 06/27/2015   Procedure: Coronary Stent Intervention;  Surgeon: Yolonda Kida, MD;  Location: [place] [facility];  Service:  Cardiovascular;  Laterality: N/A;  . CLOSED REDUCTION NASAL FRACTURE  12/22/2011   Procedure: CLOSED REDUCTION NASAL FRACTURE;  Surgeon: Ascencion Dike, MD;  Location: [place];  Service: ENT;  Laterality: N/A;  closed reduction of nasal fracture  . FACIAL FRACTURE SURGERY     face-upper jaw with dental implants  . GASTRIC BYPASS  2011   has lost 140lb  . LEFT HEART CATH AND CORONARY ANGIOGRAPHY N/A 06/25/2016   Procedure: Left Heart Cath and Coronary Angiography;  Surgeon: Corey Skains, MD;  Location: [place] [facility];  Service: Cardiovascular;  Laterality: N/A;  . REPAIR TENDONS FOOT  2002   rt foot  . SPINAL CORD STIMULATOR IMPLANT  6/13    SOCIAL HISTORY:   Social History  Substance Use Topics  . Smoking status: Never Smoker  . Smokeless tobacco: Never Used  . Alcohol use No     Comment: not in 4 yr    FAMILY HISTORY:   Family History  Problem Relation Age of Onset  . Diabetes Father     DRUG ALLERGIES:   Allergies  Allergen Reactions  . Lisinopril Cough    REVIEW OF SYSTEMS:   Review of Systems  Constitutional: Negative.  Negative for chills, fever and malaise/fatigue.  HENT: Negative.  Negative for ear discharge, ear pain, hearing loss, nosebleeds and sore throat.  Eyes: Negative.  Negative for blurred vision and pain.  Respiratory: Negative.  Negative for cough, hemoptysis, shortness of breath and wheezing.   Cardiovascular: Positive for chest pain. Negative for palpitations and leg swelling.  Gastrointestinal: Negative.  Negative for abdominal pain, blood in stool, diarrhea, nausea and vomiting.  Genitourinary: Negative.  Negative for dysuria.  Musculoskeletal: Negative.  Negative for back pain.  Skin: Negative.   Neurological: Negative for dizziness, tremors, speech change, focal weakness, seizures and headaches.  Endo/Heme/Allergies: Negative.  Does not bruise/bleed easily.  Psychiatric/Behavioral: Negative.  Negative for depression,  hallucinations and suicidal ideas.    MEDICATIONS AT HOME:   Prior to Admission medications   Medication Sig Start Date End Date Taking? Authorizing Provider  acidophilus (RISAQUAD) CAPS capsule Take 1 capsule by mouth daily.   Yes [provider]  albuterol (PROVENTIL HFA;VENTOLIN HFA) 108 (90 Base) MCG/ACT inhaler Inhale 2 puffs into the lungs every 6 (six) hours as needed for wheezing or shortness of breath. 07/11/15  Yes Sudini, Srikar, MD  amLODipine (NORVASC) 5 MG tablet Take 5 mg by mouth daily.   Yes [provider]  atorvastatin (LIPITOR) 40 MG tablet Take 1 tablet (40 mg total) by mouth daily at 6 PM. Patient taking differently: Take 20 mg by mouth daily at 6 PM.  06/28/15  Yes Vaughan Basta, MD  busPIRone (BUSPAR) 15 MG tablet Take 30 mg by mouth 2 (two) times daily.    Yes [provider]  Cyanocobalamin (B-12 PO) Take 1 tablet by mouth daily.   Yes [provider]  DULoxetine (CYMBALTA) 60 MG capsule Take 60 mg by mouth daily.   Yes [provider]  gabapentin (NEURONTIN) 300 MG capsule Take 600 mg by mouth 6 (six) times daily.   Yes [provider]  levothyroxine (SYNTHROID, LEVOTHROID) 75 MCG tablet Take 75 mcg by mouth daily before breakfast.    Yes [provider]  morphine (MS CONTIN) 15 MG 12 hr tablet Take 15-30 mg by mouth every 12 (twelve) hours. Pt takes two tablets in the morning and one at night.   Yes [provider]  Multiple Vitamin (MULTIVITAMIN WITH MINERALS) TABS tablet Take 1 tablet by mouth daily.   Yes [provider]  omeprazole (PRILOSEC) 20 MG capsule Take 20 mg by mouth daily.    Yes [provider]  Oxycodone HCl 10 MG TABS Take 10 mg by mouth 6 (six) times daily.   Yes [provider]  sildenafil (REVATIO) 20 MG tablet Take 20 mg by mouth 3 (three) times daily.   Yes [provider]  Testosterone Undecanoate 750 MG/3ML SOLN Inject 750 mg into  the muscle once a week.   Yes [provider]  zolpidem (AMBIEN) 10 MG tablet Take 10 mg by mouth at bedtime.   Yes [provider]  ALPRAZolam Duanne Moron) 1 MG tablet Take 1 mg by mouth 3 (three) times daily.     [provider]  ticagrelor (BRILINTA) 90 MG TABS tablet Take by mouth 2 (two) times daily.    [provider]      VITAL SIGNS:  Blood pressure (!) 138/95, pulse 94, temperature 98.8 F (37.1 C), temperature source Oral, resp. rate 10, height 6\' 2"  (1.88 m), weight 136.1 kg (300 lb), SpO2 96 %.  PHYSICAL EXAMINATION:   Physical Exam  Constitutional: He is oriented to person, place, and time and well-developed, well-nourished, and in no distress. No distress.  HENT:  Head: Normocephalic.  Eyes:  No scleral icterus.  Neck: Normal range of motion. Neck supple. No JVD present. No tracheal deviation present.  Cardiovascular: Normal rate, regular rhythm and normal heart sounds.  Exam reveals no gallop and no friction rub.   No murmur heard. Pulmonary/Chest: Effort normal and breath sounds normal. No respiratory distress. He has no wheezes. He has no rales. He exhibits tenderness.  Abdominal: Soft. Bowel sounds are normal. He exhibits no distension and no mass. There is no tenderness. There is no rebound and no guarding.  Musculoskeletal: Normal range of motion. He exhibits no edema.  Neurological: He is alert and oriented to person, place, and time.  Skin: Skin is warm. No rash noted. No erythema.  Psychiatric: Affect and judgment normal.      LABORATORY PANEL:   CBC  Recent Labs Lab 11/17/16 1018  WBC 8.4  HGB 17.6  HCT 51.8  PLT 273   ------------------------------------------------------------------------------------------------------------------  Chemistries   Recent Labs Lab 11/17/16 1018  NA 138  K 4.1  CL 103  CO2 26  GLUCOSE 109*  BUN 14  CREATININE 0.90  CALCIUM 9.1    ------------------------------------------------------------------------------------------------------------------  Cardiac Enzymes  Recent Labs Lab 11/17/16 1018  TROPONINI <0.03   ------------------------------------------------------------------------------------------------------------------  RADIOLOGY:  Dg Chest 1 View  Result Date: 11/17/2016 CLINICAL DATA:  Chest pain. EXAM: CHEST 1 VIEW COMPARISON:  08/14/2015 FINDINGS: The heart size and pulmonary vascularity are normal and the lungs are clear. No acute bone abnormality. Thoracic spinal fusion with spinal cord stimulator in place, stable. Old bilateral rib fractures. Congenital bifid anterior left fourth rib. Old left clavicle fracture. IMPRESSION: No active disease. Electronically Signed   By: Lorriane Shire M.D.   On: 11/17/2016 10:38    EKG:   Sinus rhythm no ST elevation or depression  IMPRESSION AND PLAN:   57 year old male with history of CAD who presents with chest pain.  1. Chest pain: Patient reports his symptoms are similar previous episode of chest pain in which she had stents placed. He also has musculoskeletal chest pain tenderness on palpation. Follow telemetry Follow cardiac enzymes Myoview in a.m. if troponins negative Cardiology consult Continue nitroglycerin and when necessary morphine  2. Chronic pain syndrome: Continue patient medications 3. CAD: Continue Lipitor, brillanta, and atorvastatin  4. Hypothyroid: Continue Synthroid  5. Depression: Continue Cymbalta and BuSpar  6. Essential hypertension: Continue Norvasc     All the records are reviewed and case discussed with ED provider. Management plans discussed with the patient and he is in agreement  CODE STATUS: FULL  TOTAL TIME TAKING CARE OF THIS PATIENT: 45 minutes.    Makyle Eslick M.D on 11/17/2016 at 12:18 PM  Between 7am to 6pm - Pager - [phone]  After 6pm go to [email or web address] - password [email or web address]  Office  [phone]  CC: Primary care physician; Lavera Guise, MD

## 2016-11-17 NOTE — ED Notes (Signed)
Pt given blanket and pillow. Family at bedside.

## 2016-11-18 ENCOUNTER — Observation Stay: Payer: BLUE CROSS/BLUE SHIELD

## 2016-11-18 DIAGNOSIS — E039 Hypothyroidism, unspecified: Secondary | ICD-10-CM | POA: Diagnosis not present

## 2016-11-18 DIAGNOSIS — I2510 Atherosclerotic heart disease of native coronary artery without angina pectoris: Secondary | ICD-10-CM | POA: Diagnosis not present

## 2016-11-18 DIAGNOSIS — G8929 Other chronic pain: Secondary | ICD-10-CM | POA: Diagnosis not present

## 2016-11-18 DIAGNOSIS — R079 Chest pain, unspecified: Secondary | ICD-10-CM | POA: Diagnosis not present

## 2016-11-18 LAB — BASIC METABOLIC PANEL
ANION GAP: 7 (ref 5–15)
BUN: 17 mg/dL (ref 6–20)
CALCIUM: 8.9 mg/dL (ref 8.9–10.3)
CO2: 27 mmol/L (ref 22–32)
Chloride: 104 mmol/L (ref 101–111)
Creatinine, Ser: 0.96 mg/dL (ref 0.61–1.24)
GFR calc Af Amer: >60 mL/min (ref >60)
Glucose, Bld: 120 mg/dL — ABNORMAL HIGH (ref 65–99)
POTASSIUM: 3.9 mmol/L (ref 3.5–5.1)
SODIUM: 138 mmol/L (ref 135–145)

## 2016-11-18 LAB — CBC
HEMATOCRIT: 52 % (ref 40.0–52.0)
HEMOGLOBIN: 17.3 g/dL (ref 13.0–18.0)
MCH: 29.4 pg (ref 26.0–34.0)
MCHC: 33.2 g/dL (ref 32.0–36.0)
MCV: 88.7 fL (ref 80.0–100.0)
Platelets: 267 10*3/uL (ref 150–440)
RBC: 5.87 MIL/uL (ref 4.40–5.90)
RDW: 16.3 % — AB (ref 11.5–14.5)
WBC: 9.2 10*3/uL (ref 3.8–10.6)

## 2016-11-18 LAB — HEMOGLOBIN A1C
Hgb A1c MFr Bld: 5.8 % — ABNORMAL HIGH (ref 4.8–5.6)
MEAN PLASMA GLUCOSE: 120 mg/dL

## 2016-11-18 LAB — TROPONIN I: Troponin I: <0.03 ng/mL (ref <0.03)

## 2016-11-18 LAB — HIV ANTIBODY (ROUTINE TESTING W REFLEX): HIV Screen 4th Generation wRfx: NONREACTIVE

## 2016-11-18 MED ORDER — ASPIRIN EC 81 MG PO TBEC: 81.0000 mg | DELAYED_RELEASE_TABLET | Freq: Every day | ORAL | 0 refills | Status: DC

## 2016-11-18 MED ORDER — CARVEDILOL 6.25 MG PO TABS
6.2500 mg | ORAL_TABLET | Freq: Two times a day (BID) | ORAL | Status: DC
  Administered 2016-11-18 – 2016-11-19 (×3): 6.25 mg via ORAL
  Filled 2016-11-18 (×3): qty 1

## 2016-11-18 MED ORDER — CARVEDILOL 6.25 MG PO TABS: 6.2500 mg | ORAL_TABLET | Freq: Two times a day (BID) | ORAL | 0 refills | Status: DC

## 2016-11-18 MED ORDER — REGADENOSON 0.4 MG/5ML IV SOLN
0.4000 mg | Freq: Once | INTRAVENOUS | Status: AC
  Administered 2016-11-18: 0.4 mg via INTRAVENOUS

## 2016-11-18 MED ORDER — TECHNETIUM TC 99M TETROFOSMIN IV KIT
32.9600 | PACK | Freq: Once | INTRAVENOUS | Status: AC | PRN
  Administered 2016-11-18: 32.96 via INTRAVENOUS

## 2016-11-18 NOTE — Progress Notes (Signed)
Notified Dr. Jannifer Franklin of patient's renewed chest pain. Nitroglycerin ordered and given. Will continue to monitor.

## 2016-11-18 NOTE — Discharge Instructions (Signed)
Resume diet and activity as before ° ° °

## 2016-11-19 ENCOUNTER — Observation Stay: Payer: BLUE CROSS/BLUE SHIELD

## 2016-11-19 DIAGNOSIS — G8929 Other chronic pain: Secondary | ICD-10-CM | POA: Diagnosis not present

## 2016-11-19 DIAGNOSIS — E039 Hypothyroidism, unspecified: Secondary | ICD-10-CM | POA: Diagnosis not present

## 2016-11-19 DIAGNOSIS — M25562 Pain in left knee: Secondary | ICD-10-CM | POA: Diagnosis not present

## 2016-11-19 DIAGNOSIS — I2510 Atherosclerotic heart disease of native coronary artery without angina pectoris: Secondary | ICD-10-CM | POA: Diagnosis not present

## 2016-11-19 DIAGNOSIS — M545 Low back pain: Secondary | ICD-10-CM | POA: Diagnosis not present

## 2016-11-19 DIAGNOSIS — R079 Chest pain, unspecified: Secondary | ICD-10-CM | POA: Diagnosis not present

## 2016-11-19 DIAGNOSIS — G894 Chronic pain syndrome: Secondary | ICD-10-CM | POA: Diagnosis not present

## 2016-11-19 DIAGNOSIS — M79605 Pain in left leg: Secondary | ICD-10-CM | POA: Diagnosis not present

## 2016-11-19 LAB — NM MYOCAR MULTI W/SPECT W/WALL MOTION / EF
CHL CUP NUCLEAR SSS: 1
CSEPPHR: 93 {beats}/min
Estimated workload: 1 METS
Exercise duration (min): 1 min
Exercise duration (sec): 0 s
LVDIAVOL: 144 mL (ref 62–150)
LVSYSVOL: 83 mL
NUC STRESS TID: 1.04
Rest HR: 81 {beats}/min
SDS: 0
SRS: 9

## 2016-11-19 MED ORDER — TECHNETIUM TC 99M TETROFOSMIN IV KIT
30.0000 | PACK | Freq: Once | INTRAVENOUS | Status: AC | PRN
  Administered 2016-11-19: 28.71 via INTRAVENOUS

## 2016-11-19 NOTE — Discharge Summary (Signed)
[place] [facility] [place] NAME: Charles [name]    MR#:  [id number]  DATE OF BIRTH:  03-21-1961  DATE OF ADMISSION:  11/17/2016 ADMITTING PHYSICIAN: Bettey Costa, MD  DATE OF DISCHARGE: 11/19/2016 11:31 AM  PRIMARY CARE PHYSICIAN: Lavera Guise, MD   ADMISSION DIAGNOSIS:  Chest pain, unspecified type [R07.9]  DISCHARGE DIAGNOSIS:  Active Problems:   Chest pain   SECONDARY DIAGNOSIS:   Past Medical History:  Diagnosis Date  . Anginal pain (Charles [name])   . Anxiety   . Arthritis   . Chronic back pain   . Coronary artery disease   . Depression   . GERD (gastroesophageal reflux disease)   . Hypertension   . Hypothyroidism   . Insomnia   . Low testosterone   . Myocardial infarction (Charles [name])   . Peptic ulcer   . Shortness of breath   . Sleep apnea    has cpap-has not used since lost 140lb  . Spinal cord stimulator status    has a scs  . Wears glasses      ADMITTING HISTORY  HISTORY OF PRESENT ILLNESS:  Charles [name]  is a 56 y.o. male with a known history of CAD, Chronic pain syndrome and depression who presents with above complaint. Patient reports that this morning when he was driving to work he developed sudden onset of left substernal chest pain associated with shortness of breath and palpitations. He currently says his pain is 7 out of 10. He has a nitroglycerin patch now placed in the emergency room. His first set of troponins are negative. EKG shows no acute changes. He does report that his chest pain is similar to his previous heart attack in which she had stents placed. He denies any trauma or musculoskeletal issues.   HOSPITAL COURSE:   56 year old male with history of CAD who presents with chest pain.  1. Chest pain: Atypical but does have risk factors. Patient was admitted to telemetry floor. EKG showed no acute changes. Troponin checked 3 times was normal. No arrhythmias on telemetry. Patient was seen by cardiology Dr. Tomasita Crumble shows  an scheduled for a stress test. Due to his obesity patient had a 2 day stress test. Had small nonreversible apical scarring on stress test. Low-risk study. Patient is chest pain-free. His pain was likely due to uncontrolled hypertension. He is being discharged home on aspirin, beta blocker, ACE inhibitor and statin. Follow-up with cardiology in 1 week.  2. Chronic pain syndrome: Continue patient medications  3. CAD: Continue Lipitor, Asa,and atorvastatin. Add coreg  4. Hypothyroid: Continue Synthroid  5. Depression: Continue Cymbalta and BuSpar  6. Essential hypertension: Continue Norvasc.  uncontrolled. Patient continued on his home medications. Added Coreg.  Stable for discharge home.  CONSULTS OBTAINED:  Treatment Team:  Isaias Cowman, MD  DRUG ALLERGIES:   Allergies  Allergen Reactions  . Lisinopril Cough    DISCHARGE MEDICATIONS:   Discharge Medication List as of 11/19/2016 11:20 AM    START taking these medications   Details  aspirin EC 81 MG tablet Take 1 tablet (81 mg total) by mouth daily., Starting Wed 11/18/2016, Normal    carvedilol (COREG) 6.25 MG tablet Take 1 tablet (6.25 mg total) by mouth 2 (two) times daily with a meal., Starting Wed 11/18/2016, Normal      CONTINUE these medications which have NOT CHANGED   Details  acidophilus (RISAQUAD) CAPS capsule Take 1 capsule by mouth daily., Until Discontinued, Historical Med  albuterol (PROVENTIL HFA;VENTOLIN HFA) 108 (90 Base) MCG/ACT inhaler Inhale 2 puffs into the lungs every 6 (six) hours as needed for wheezing or shortness of breath., Starting 07/11/2015, Until Discontinued, Normal    amLODipine (NORVASC) 5 MG tablet Take 5 mg by mouth daily., Historical Med    atorvastatin (LIPITOR) 40 MG tablet Take 1 tablet (40 mg total) by mouth daily at 6 PM., Starting 06/28/2015, Until Discontinued, Print    busPIRone (BUSPAR) 15 MG tablet Take 30 mg by mouth 2 (two) times daily. , Until Discontinued,  Historical Med    Cyanocobalamin (B-12 PO) Take 1 tablet by mouth daily., Until Discontinued, Historical Med    DULoxetine (CYMBALTA) 60 MG capsule Take 60 mg by mouth daily., Until Discontinued, Historical Med    gabapentin (NEURONTIN) 300 MG capsule Take 600 mg by mouth 6 (six) times daily., Until Discontinued, Historical Med    levothyroxine (SYNTHROID, LEVOTHROID) 75 MCG tablet Take 75 mcg by mouth daily before breakfast. , Until Discontinued, Historical Med    morphine (MS CONTIN) 15 MG 12 hr tablet Take 15-30 mg by mouth every 12 (twelve) hours. Pt takes two tablets in the morning and one at night., Until Discontinued, Historical Med    Multiple Vitamin (MULTIVITAMIN WITH MINERALS) TABS tablet Take 1 tablet by mouth daily., Until Discontinued, Historical Med    omeprazole (PRILOSEC) 20 MG capsule Take 20 mg by mouth daily. , Until Discontinued, Historical Med    Oxycodone HCl 10 MG TABS Take 10 mg by mouth 6 (six) times daily., Until Discontinued, Historical Med    sildenafil (REVATIO) 20 MG tablet Take 20 mg by mouth 3 (three) times daily., Historical Med    Testosterone Undecanoate 750 MG/3ML SOLN Inject 750 mg into the muscle once a week., Historical Med    zolpidem (AMBIEN) 10 MG tablet Take 10 mg by mouth at bedtime., Until Discontinued, Historical Med    ALPRAZolam (XANAX) 1 MG tablet Take 1 mg by mouth 3 (three) times daily. , Until Discontinued, Historical Med    ticagrelor (BRILINTA) 90 MG TABS tablet Take by mouth 2 (two) times daily., Historical Med        Today   VITAL SIGNS:  Blood pressure (!) 151/97, pulse 98, temperature 98.5 F (36.9 C), temperature source Oral, resp. rate 18, height 6\' 2"  (1.88 m), weight (!) 138.3 kg (305 lb), SpO2 95 %.  I/O:   Intake/Output Summary (Last 24 hours) at 11/19/16 1245 Last data filed at 11/19/16 0900  Gross per 24 hour  Intake             1200 ml  Output                0 ml  Net             1200 ml    PHYSICAL  EXAMINATION:  Physical Exam  GENERAL:  56 y.o.-year-old patient lying in the bed with no acute distress.  LUNGS: Normal breath sounds bilaterally, no wheezing, rales,rhonchi or crepitation. No use of accessory muscles of respiration.  CARDIOVASCULAR: S1, S2 normal. No murmurs, rubs, or gallops.  ABDOMEN: Soft, non-tender, non-distended. Bowel sounds present. No organomegaly or mass.  NEUROLOGIC: Moves all 4 extremities. PSYCHIATRIC: The patient is alert and oriented x 3.  SKIN: No obvious rash, lesion, or ulcer.   DATA REVIEW:   CBC  Recent Labs Lab 11/18/16 0313  WBC 9.2  HGB 17.3  HCT 52.0  PLT 267    Chemistries  Recent Labs Lab 11/18/16 0313  NA 138  K 3.9  CL 104  CO2 27  GLUCOSE 120*  BUN 17  CREATININE 0.96  CALCIUM 8.9    Cardiac Enzymes  Recent Labs Lab 11/18/16 0313  TROPONINI <0.03    Microbiology Results  Results for orders placed or performed during the hospital encounter of 07/09/15  Rapid Influenza A&B Antigens ([place] only)     Status: None   Collection Time: 07/09/15 10:18 PM  Result Value Ref Range Status   Influenza A ([facility]) NEGATIVE NEGATIVE Final   Influenza B ([facility]) NEGATIVE NEGATIVE Final  Blood culture (routine x 2)     Status: None   Collection Time: 07/09/15 10:37 PM  Result Value Ref Range Status   Specimen Description BLOOD LEFT ANTECUBITAL  Final   Special Requests BOTTLES DRAWN AEROBIC AND ANAEROBIC 10ML  Final   Culture NO GROWTH 23 DAYS  Final   Report Status 08/01/2015 FINAL  Final  Blood culture (routine x 2)     Status: None   Collection Time: 07/09/15 10:41 PM  Result Value Ref Range Status   Specimen Description BLOOD RIGHT HAND  Final   Special Requests BOTTLES DRAWN AEROBIC AND ANAEROBIC 10ML  Final   Culture NO GROWTH 23 DAYS  Final   Report Status 08/01/2015 FINAL  Final    RADIOLOGY:  Nm Myocar Multi W/spect W/wall Motion / Ef  Result Date: 11/19/2016  Blood pressure demonstrated a normal response to  exercise.  There was no ST segment deviation noted during stress.  Defect 1: There is a small defect of mild severity present in the apex location.  This is a low risk study.  The left ventricular ejection fraction is moderately decreased (30-44%).     Follow up with PCP in 1 week.  Management plans discussed with the patient, family and they are in agreement.  CODE STATUS:     Code Status Orders        Start     Ordered   11/17/16 1516  Full code  Continuous     11/17/16 1515    Code Status History    Date Active Date Inactive Code Status Order ID Comments User Context   07/10/2015  5:00 AM 07/11/2015  5:14 PM Full Code [id number]  Lance Coon, MD Inpatient   06/27/2015  1:59 PM 06/28/2015 12:13 PM Full Code [id number]  Yolonda Kida, MD Inpatient   06/25/2015  8:10 PM 06/27/2015  1:59 PM Full Code [id number]  Vaughan Basta, MD Inpatient    Advance Directive Documentation     Most Recent Value  Type of Advance Directive  Living will  Pre-existing out of facility DNR order (yellow form or pink MOST form)  -  "MOST" Form in Place?  -      TOTAL TIME TAKING CARE OF THIS PATIENT ON DAY OF DISCHARGE: more than 30 minutes.   Hillary Bow R M.D on 11/19/2016 at 12:45 PM  Between 7am to 6pm - Pager - [phone]  After 6pm go to [email or web address] - password [email or web address] [facility]  Office  [phone]  CC: Primary care physician; Lavera Guise, MD  Note: This dictation was prepared with Dragon dictation along with smaller phrase technology. Any transcriptional errors that result from this process are unintentional.

## 2016-11-19 NOTE — Progress Notes (Signed)
[facility] [facility] [facility] [place] NAME: Charles [name]    MR#:  [id number]  DATE OF BIRTH:  04-20-1961  SUBJECTIVE:  CHIEF COMPLAINT:   Chief Complaint  Patient presents with  . Chest Pain   No CP today. BP elevated  REVIEW OF SYSTEMS:    Review of Systems  Constitutional: Negative for chills and fever.  HENT: Negative for sore throat.   Eyes: Negative for blurred vision, double vision and pain.  Respiratory: Negative for cough, hemoptysis, shortness of breath and wheezing.   Cardiovascular: Positive for chest pain. Negative for palpitations, orthopnea and leg swelling.  Gastrointestinal: Negative for abdominal pain, constipation, diarrhea, heartburn, nausea and vomiting.  Genitourinary: Negative for dysuria and hematuria.  Musculoskeletal: Negative for back pain and joint pain.  Skin: Negative for rash.  Neurological: Negative for sensory change, speech change, focal weakness and headaches.  Endo/Heme/Allergies: Does not bruise/bleed easily.  Psychiatric/Behavioral: Negative for depression. The patient is not nervous/anxious.     DRUG ALLERGIES:   Allergies  Allergen Reactions  . Lisinopril Cough    VITALS:  Blood pressure (!) 146/91, pulse 64, temperature 97.8 F (36.6 C), temperature source Oral, resp. rate 18, height 6\' 2"  (1.88 m), weight (!) 138.3 kg (305 lb), SpO2 93 %.  PHYSICAL EXAMINATION:   Physical Exam  GENERAL:  56 y.o.-year-old patient lying in the bed with no acute distress.Obese  EYES: Pupils equal, round, reactive to light and accommodation. No scleral icterus. Extraocular muscles intact.  HEENT: Head atraumatic, normocephalic. Oropharynx and nasopharynx clear.  NECK:  Supple, no jugular venous distention. No thyroid enlargement, no tenderness.  LUNGS: Normal breath sounds bilaterally, no wheezing, rales, rhonchi. No use of accessory muscles of respiration.  CARDIOVASCULAR: S1, S2 normal. No murmurs, rubs, or gallops.   ABDOMEN: Soft, nontender, nondistended. Bowel sounds present. No organomegaly or mass.  EXTREMITIES: No cyanosis, clubbing or edema b/l.    NEUROLOGIC: Cranial nerves II through XII are intact. No focal Motor or sensory deficits b/l.   PSYCHIATRIC: The patient is alert and oriented x 3.  SKIN: No obvious rash, lesion, or ulcer.   LABORATORY PANEL:   CBC  Recent Labs Lab 11/18/16 0313  WBC 9.2  HGB 17.3  HCT 52.0  PLT 267   ------------------------------------------------------------------------------------------------------------------ Chemistries   Recent Labs Lab 11/18/16 0313  NA 138  K 3.9  CL 104  CO2 27  GLUCOSE 120*  BUN 17  CREATININE 0.96  CALCIUM 8.9   ------------------------------------------------------------------------------------------------------------------  Cardiac Enzymes  Recent Labs Lab 11/18/16 0313  TROPONINI <0.03   ------------------------------------------------------------------------------------------------------------------  RADIOLOGY:  Dg Chest 1 View  Result Date: 11/17/2016 CLINICAL DATA:  Chest pain. EXAM: CHEST 1 VIEW COMPARISON:  08/14/2015 FINDINGS: The heart size and pulmonary vascularity are normal and the lungs are clear. No acute bone abnormality. Thoracic spinal fusion with spinal cord stimulator in place, stable. Old bilateral rib fractures. Congenital bifid anterior left fourth rib. Old left clavicle fracture. IMPRESSION: No active disease. Electronically Signed   By: Lorriane Shire M.D.   On: 11/17/2016 10:38     ASSESSMENT AND PLAN:   56 year old male with history of CAD who presents with chest pain.  1. Chest pain: Atypical but does have risk factors. Stress test over 2 days. Discussed with Dr. Glynis Smiles. Wait for stress images tomorrow AM  2. Chronic pain syndrome: Continue patient medications  3. CAD: Continue Lipitor, Asa, and atorvastatin. Add coreg  4. Hypothyroid: Continue Synthroid  5.  Depression: Continue Cymbalta and BuSpar  6. Essential hypertension: Continue Norvasc. Add coreg  All the records are reviewed and case discussed with Care Management/Social Workerr. Management plans discussed with the patient, family and they are in agreement.  CODE STATUS: FULL CODE  DVT Prophylaxis: SCDs  TOTAL TIME TAKING CARE OF THIS PATIENT: 30 minutes.   POSSIBLE D/C IN 1-2 DAYS, DEPENDING ON CLINICAL CONDITION.  Hillary Bow R M.D on 11/19/2016 at 7:50 AM  Between 7am to 6pm - Pager - [phone]  After 6pm go to [email or web address] - password [email or web address] [facility]  Office  [phone]  CC: Primary care physician; Lavera Guise, MD  Note: This dictation was prepared with Dragon dictation along with smaller phrase technology. Any transcriptional errors that result from this process are unintentional.

## 2016-11-19 NOTE — Progress Notes (Signed)
Patient was discharge home as per order.

## 2016-11-19 NOTE — Progress Notes (Signed)
Patient alert an oriented. Denies any pain at this time, vss, patient returned to floor stress test no distress note

## 2016-11-19 NOTE — Progress Notes (Signed)
Patient is being discharge home, discharge instruction provided,  Tele removed

## 2016-11-26 DIAGNOSIS — E782 Mixed hyperlipidemia: Secondary | ICD-10-CM | POA: Diagnosis not present

## 2016-11-26 DIAGNOSIS — I10 Essential (primary) hypertension: Secondary | ICD-10-CM | POA: Diagnosis not present

## 2016-11-26 DIAGNOSIS — G4733 Obstructive sleep apnea (adult) (pediatric): Secondary | ICD-10-CM | POA: Diagnosis not present

## 2016-11-26 DIAGNOSIS — I200 Unstable angina: Secondary | ICD-10-CM | POA: Diagnosis not present

## 2016-11-27 DIAGNOSIS — R079 Chest pain, unspecified: Secondary | ICD-10-CM | POA: Diagnosis not present

## 2016-11-27 DIAGNOSIS — M21611 Bunion of right foot: Secondary | ICD-10-CM | POA: Diagnosis not present

## 2016-11-27 DIAGNOSIS — L089 Local infection of the skin and subcutaneous tissue, unspecified: Secondary | ICD-10-CM | POA: Diagnosis not present

## 2016-11-27 DIAGNOSIS — I10 Essential (primary) hypertension: Secondary | ICD-10-CM | POA: Diagnosis not present

## 2016-12-02 DIAGNOSIS — F331 Major depressive disorder, recurrent, moderate: Secondary | ICD-10-CM | POA: Diagnosis not present

## 2016-12-02 DIAGNOSIS — F411 Generalized anxiety disorder: Secondary | ICD-10-CM | POA: Diagnosis not present

## 2016-12-02 DIAGNOSIS — D509 Iron deficiency anemia, unspecified: Secondary | ICD-10-CM | POA: Diagnosis not present

## 2016-12-02 DIAGNOSIS — L089 Local infection of the skin and subcutaneous tissue, unspecified: Secondary | ICD-10-CM | POA: Diagnosis not present

## 2016-12-18 ENCOUNTER — Encounter: Payer: Self-pay | Admitting: *Deleted

## 2016-12-18 DIAGNOSIS — M25572 Pain in left ankle and joints of left foot: Secondary | ICD-10-CM | POA: Diagnosis not present

## 2016-12-18 DIAGNOSIS — M79605 Pain in left leg: Secondary | ICD-10-CM | POA: Diagnosis not present

## 2016-12-18 DIAGNOSIS — M25562 Pain in left knee: Secondary | ICD-10-CM | POA: Diagnosis not present

## 2016-12-18 DIAGNOSIS — G894 Chronic pain syndrome: Secondary | ICD-10-CM | POA: Diagnosis not present

## 2016-12-22 ENCOUNTER — Ambulatory Visit
Admission: RE | Admit: 2016-12-22 | Payer: BLUE CROSS/BLUE SHIELD | Source: Ambulatory Visit | Admitting: Gastroenterology

## 2016-12-22 ENCOUNTER — Encounter: Admission: RE | Payer: Self-pay | Source: Ambulatory Visit

## 2016-12-22 SURGERY — COLONOSCOPY WITH PROPOFOL
Anesthesia: General

## 2017-01-07 DIAGNOSIS — G5793 Unspecified mononeuropathy of bilateral lower limbs: Secondary | ICD-10-CM | POA: Diagnosis not present

## 2017-01-07 DIAGNOSIS — M2011 Hallux valgus (acquired), right foot: Secondary | ICD-10-CM | POA: Diagnosis not present

## 2017-01-07 DIAGNOSIS — L97511 Non-pressure chronic ulcer of other part of right foot limited to breakdown of skin: Secondary | ICD-10-CM | POA: Diagnosis not present

## 2017-01-15 DIAGNOSIS — G894 Chronic pain syndrome: Secondary | ICD-10-CM | POA: Diagnosis not present

## 2017-01-15 DIAGNOSIS — M79605 Pain in left leg: Secondary | ICD-10-CM | POA: Diagnosis not present

## 2017-01-15 DIAGNOSIS — M25562 Pain in left knee: Secondary | ICD-10-CM | POA: Diagnosis not present

## 2017-01-15 DIAGNOSIS — M25572 Pain in left ankle and joints of left foot: Secondary | ICD-10-CM | POA: Diagnosis not present

## 2017-01-21 DIAGNOSIS — G5793 Unspecified mononeuropathy of bilateral lower limbs: Secondary | ICD-10-CM | POA: Diagnosis not present

## 2017-01-21 DIAGNOSIS — M2011 Hallux valgus (acquired), right foot: Secondary | ICD-10-CM | POA: Diagnosis not present

## 2017-02-01 DIAGNOSIS — M545 Low back pain: Secondary | ICD-10-CM | POA: Diagnosis not present

## 2017-02-02 ENCOUNTER — Encounter: Payer: Self-pay | Admitting: *Deleted

## 2017-02-02 DIAGNOSIS — M2011 Hallux valgus (acquired), right foot: Secondary | ICD-10-CM | POA: Diagnosis not present

## 2017-02-03 ENCOUNTER — Other Ambulatory Visit: Payer: Self-pay | Admitting: Podiatry

## 2017-02-03 DIAGNOSIS — I10 Essential (primary) hypertension: Secondary | ICD-10-CM | POA: Diagnosis not present

## 2017-02-03 DIAGNOSIS — G4733 Obstructive sleep apnea (adult) (pediatric): Secondary | ICD-10-CM | POA: Diagnosis not present

## 2017-02-03 DIAGNOSIS — I2510 Atherosclerotic heart disease of native coronary artery without angina pectoris: Secondary | ICD-10-CM | POA: Diagnosis not present

## 2017-02-03 DIAGNOSIS — E782 Mixed hyperlipidemia: Secondary | ICD-10-CM | POA: Diagnosis not present

## 2017-02-03 NOTE — Anesthesia Preprocedure Evaluation (Signed)
Anesthesia Evaluation    Airway        Dental   Pulmonary           Cardiovascular hypertension,      Neuro/Psych    GI/Hepatic   Endo/Other    Renal/GU      Musculoskeletal   Abdominal   Peds  Hematology   Anesthesia Other Findings   Reproductive/Obstetrics                             Anesthesia Physical Anesthesia Plan Anesthesia Quick Evaluation  Per telephone note in computer on 01/25/17 - pt "low risk" for surgery per Dr. Nehemiah Massed (Cardiology)

## 2017-02-08 NOTE — Discharge Instructions (Signed)
[facility] °[facility] ° °POST OPERATIVE INSTRUCTIONS FOR DR. TROXLER AND DR. FOWLER °[facility] ° ° °1. Take your medication as prescribed.  Pain medication should be taken only as needed. ° °2. Keep the dressing clean, dry and intact. ° °3. Keep your foot elevated above the heart level for the first 48 hours. ° °4. Walking to the bathroom and brief periods of walking are acceptable, unless we have instructed you to be non-weight bearing. ° °5. Always wear your post-op shoe when walking.  Always use your crutches if you are to be non-weight bearing. ° °6. Do not take a shower. Baths are permissible as long as the foot is kept out of the water.  ° °7. Every hour you are awake:  °- Bend your knee 15 times. °- Flex foot 15 times °- Massage calf 15 times ° °8. Call [facility] ([phone]) if any of the following problems occur: °- You develop a temperature or fever. °- The bandage becomes saturated with blood. °- Medication does not stop your pain. °- Injury of the foot occurs. °- Any symptoms of infection including redness, odor, or red streaks running from wound. ° ° °General Anesthesia, Adult, Care After °These instructions provide you with information about caring for yourself after your procedure. Your health care provider may also give you more specific instructions. Your treatment has been planned according to current medical practices, but problems sometimes occur. Call your health care provider if you have any problems or questions after your procedure. °What can I expect after the procedure? °After the procedure, it is common to have: °· Vomiting. °· A sore throat. °· Mental slowness. ° °It is common to feel: °· Nauseous. °· Cold or shivery. °· Sleepy. °· Tired. °· Sore or achy, even in parts of your body where you did not have surgery. ° °Follow these instructions at home: °For at least 24 hours after the procedure: °· Do not: °? Participate in  activities where you could fall or become injured. °? Drive. °? Use heavy machinery. °? Drink alcohol. °? Take sleeping pills or medicines that cause drowsiness. °? Make important decisions or sign legal documents. °? Take care of children on your own. °· Rest. °Eating and drinking °· If you vomit, drink water, juice, or soup when you can drink without vomiting. °· Drink enough fluid to keep your urine clear or pale yellow. °· Make sure you have little or no nausea before eating solid foods. °· Follow the diet recommended by your health care provider. °General instructions °· Have a responsible adult stay with you until you are awake and alert. °· Return to your normal activities as told by your health care provider. Ask your health care provider what activities are safe for you. °· Take over-the-counter and prescription medicines only as told by your health care provider. °· If you smoke, do not smoke without supervision. °· Keep all follow-up visits as told by your health care provider. This is important. °Contact a health care provider if: °· You continue to have nausea or vomiting at home, and medicines are not helpful. °· You cannot drink fluids or start eating again. °· You cannot urinate after 8-12 hours. °· You develop a skin rash. °· You have fever. °· You have increasing redness at the site of your procedure. °Get help right away if: °· You have difficulty breathing. °· You have chest pain. °· You have unexpected bleeding. °· You feel that you   are having a life-threatening or urgent problem. °This information is not intended to replace advice given to you by your health care provider. Make sure you discuss any questions you have with your health care provider. °Document Released: 07/13/2000 Document Revised: 09/09/2015 Document Reviewed: 03/21/2015 °Elsevier Interactive Patient Education © 2018 [employer] ° °

## 2017-02-10 ENCOUNTER — Ambulatory Visit: Payer: BLUE CROSS/BLUE SHIELD | Admitting: Anesthesiology

## 2017-02-10 ENCOUNTER — Ambulatory Visit
Admission: RE | Admit: 2017-02-10 | Discharge: 2017-02-10 | Disposition: A | Discharge status: Home / Self Care | Payer: BLUE CROSS/BLUE SHIELD | Source: Ambulatory Visit | Attending: Podiatry | Admitting: Podiatry

## 2017-02-10 ENCOUNTER — Encounter
Admission: RE | Disposition: A | Discharge status: Home / Self Care | Payer: Self-pay | Source: Ambulatory Visit | Attending: Podiatry

## 2017-02-10 DIAGNOSIS — E785 Hyperlipidemia, unspecified: Secondary | ICD-10-CM | POA: Insufficient documentation

## 2017-02-10 DIAGNOSIS — Z888 Allergy status to other drugs, medicaments and biological substances status: Secondary | ICD-10-CM | POA: Diagnosis not present

## 2017-02-10 DIAGNOSIS — F1410 Cocaine abuse, uncomplicated: Secondary | ICD-10-CM | POA: Diagnosis not present

## 2017-02-10 DIAGNOSIS — F329 Major depressive disorder, single episode, unspecified: Secondary | ICD-10-CM | POA: Insufficient documentation

## 2017-02-10 DIAGNOSIS — Z79899 Other long term (current) drug therapy: Secondary | ICD-10-CM | POA: Diagnosis not present

## 2017-02-10 DIAGNOSIS — Z9884 Bariatric surgery status: Secondary | ICD-10-CM | POA: Diagnosis not present

## 2017-02-10 DIAGNOSIS — M79671 Pain in right foot: Secondary | ICD-10-CM | POA: Diagnosis not present

## 2017-02-10 DIAGNOSIS — M109 Gout, unspecified: Secondary | ICD-10-CM | POA: Insufficient documentation

## 2017-02-10 DIAGNOSIS — M25562 Pain in left knee: Secondary | ICD-10-CM | POA: Diagnosis not present

## 2017-02-10 DIAGNOSIS — G8918 Other acute postprocedural pain: Secondary | ICD-10-CM | POA: Diagnosis not present

## 2017-02-10 DIAGNOSIS — G8929 Other chronic pain: Secondary | ICD-10-CM | POA: Insufficient documentation

## 2017-02-10 DIAGNOSIS — M2011 Hallux valgus (acquired), right foot: Secondary | ICD-10-CM | POA: Diagnosis not present

## 2017-02-10 DIAGNOSIS — Z7982 Long term (current) use of aspirin: Secondary | ICD-10-CM | POA: Insufficient documentation

## 2017-02-10 DIAGNOSIS — I10 Essential (primary) hypertension: Secondary | ICD-10-CM | POA: Insufficient documentation

## 2017-02-10 DIAGNOSIS — G4733 Obstructive sleep apnea (adult) (pediatric): Secondary | ICD-10-CM | POA: Insufficient documentation

## 2017-02-10 DIAGNOSIS — I25110 Atherosclerotic heart disease of native coronary artery with unstable angina pectoris: Secondary | ICD-10-CM | POA: Insufficient documentation

## 2017-02-10 HISTORY — PX: METATARSAL OSTEOTOMY: SHX1641

## 2017-02-10 SURGERY — OSTEOTOMY, METATARSAL BONE
Anesthesia: General | Laterality: Right | Wound class: Clean

## 2017-02-10 MED ORDER — MIDAZOLAM HCL 5 MG/5ML IJ SOLN
INTRAMUSCULAR | Status: DC | PRN
  Administered 2017-02-10: 2 mg via INTRAVENOUS

## 2017-02-10 MED ORDER — PROPOFOL 10 MG/ML IV BOLUS
INTRAVENOUS | Status: DC | PRN
  Administered 2017-02-10: 40 mg via INTRAVENOUS
  Administered 2017-02-10: 200 mg via INTRAVENOUS

## 2017-02-10 MED ORDER — ONDANSETRON HCL 4 MG/2ML IJ SOLN
INTRAMUSCULAR | Status: DC | PRN
  Administered 2017-02-10: 4 mg via INTRAVENOUS

## 2017-02-10 MED ORDER — CEFAZOLIN SODIUM-DEXTROSE 2-4 GM/100ML-% IV SOLN
2.0000 g | INTRAVENOUS | Status: AC
  Administered 2017-02-10: 2 g via INTRAVENOUS

## 2017-02-10 MED ORDER — ONDANSETRON HCL 4 MG/2ML IJ SOLN: 4.0000 mg | Freq: Four times a day (QID) | INTRAMUSCULAR | Status: DC | PRN

## 2017-02-10 MED ORDER — ONDANSETRON HCL 4 MG/2ML IJ SOLN: 4.0000 mg | Freq: Once | INTRAMUSCULAR | Status: DC | PRN

## 2017-02-10 MED ORDER — OXYCODONE HCL 5 MG PO TABS: 5.0000 mg | ORAL_TABLET | Freq: Once | ORAL | Status: DC | PRN

## 2017-02-10 MED ORDER — BUPIVACAINE HCL (PF) 0.25 % IJ SOLN
INTRAMUSCULAR | Status: DC | PRN
  Administered 2017-02-10: 10 mL

## 2017-02-10 MED ORDER — FENTANYL CITRATE (PF) 100 MCG/2ML IJ SOLN
INTRAMUSCULAR | Status: DC | PRN
  Administered 2017-02-10: 50 ug via INTRAVENOUS

## 2017-02-10 MED ORDER — ACETAMINOPHEN 325 MG PO TABS: 325.0000 mg | ORAL_TABLET | ORAL | Status: DC | PRN

## 2017-02-10 MED ORDER — LACTATED RINGERS IV SOLN
INTRAVENOUS | Status: DC
  Administered 2017-02-10: 13:00:00 via INTRAVENOUS

## 2017-02-10 MED ORDER — FENTANYL CITRATE (PF) 100 MCG/2ML IJ SOLN: 25.0000 ug | INTRAMUSCULAR | Status: DC | PRN

## 2017-02-10 MED ORDER — BUPIVACAINE LIPOSOME 1.3 % IJ SUSP
INTRAMUSCULAR | Status: DC | PRN
  Administered 2017-02-10: 10 mL

## 2017-02-10 MED ORDER — ONDANSETRON HCL 4 MG PO TABS: 4.0000 mg | ORAL_TABLET | Freq: Four times a day (QID) | ORAL | Status: DC | PRN

## 2017-02-10 MED ORDER — POVIDONE-IODINE 7.5 % EX SOLN: Freq: Once | CUTANEOUS | Status: DC

## 2017-02-10 MED ORDER — LIDOCAINE HCL (CARDIAC) 20 MG/ML IV SOLN
INTRAVENOUS | Status: DC | PRN
  Administered 2017-02-10: 50 mg via INTRATRACHEAL

## 2017-02-10 MED ORDER — ACETAMINOPHEN 160 MG/5ML PO SOLN: 325.0000 mg | ORAL | Status: DC | PRN

## 2017-02-10 MED ORDER — GLYCOPYRROLATE 0.2 MG/ML IJ SOLN
INTRAMUSCULAR | Status: DC | PRN
  Administered 2017-02-10: 0.2 mg via INTRAVENOUS

## 2017-02-10 MED ORDER — DEXAMETHASONE SODIUM PHOSPHATE 4 MG/ML IJ SOLN
INTRAMUSCULAR | Status: DC | PRN
Start: 2017-02-10 — End: 2017-02-10
  Administered 2017-02-10: 8 mg via INTRAVENOUS

## 2017-02-10 MED ORDER — LACTATED RINGERS IV SOLN: INTRAVENOUS | Status: DC

## 2017-02-10 MED ORDER — OXYCODONE HCL 5 MG/5ML PO SOLN: 5.0000 mg | Freq: Once | ORAL | Status: DC | PRN

## 2017-02-10 SURGICAL SUPPLY — 48 items
BANDAGE ELASTIC 4 VELCRO NS (GAUZE/BANDAGES/DRESSINGS) ×2 IMPLANT
BENZOIN TINCTURE PRP APPL 2/3 (GAUZE/BANDAGES/DRESSINGS) ×2 IMPLANT
BLADE MED AGGRESSIVE (BLADE) ×2 IMPLANT
BLADE OSC/SAGITTAL MD 5.5X18 (BLADE) IMPLANT
BLADE SURG 15 STRL LF DISP TIS (BLADE) ×2 IMPLANT
BLADE SURG 15 STRL SS (BLADE) ×2
BNDG COHESIVE 4X5 TAN STRL (GAUZE/BANDAGES/DRESSINGS) ×2 IMPLANT
BNDG ESMARK 4X12 TAN STRL LF (GAUZE/BANDAGES/DRESSINGS) ×2 IMPLANT
BNDG GAUZE 4.5X4.1 6PLY STRL (MISCELLANEOUS) ×2 IMPLANT
BNDG STRETCH 4X75 STRL LF (GAUZE/BANDAGES/DRESSINGS) ×2 IMPLANT
CANISTER SUCT 1200ML W/VALVE (MISCELLANEOUS) ×2 IMPLANT
COVER LIGHT HANDLE UNIVERSAL (MISCELLANEOUS) ×4 IMPLANT
CUFF TOURN SGL QUICK 18 (TOURNIQUET CUFF) ×2 IMPLANT
DRAPE FLUOR MINI C-ARM 54X84 (DRAPES) ×2 IMPLANT
DURAPREP 26ML APPLICATOR (WOUND CARE) ×2 IMPLANT
GAUZE PETRO XEROFOAM 1X8 (MISCELLANEOUS) ×2 IMPLANT
GAUZE SPONGE 4X4 12PLY STRL (GAUZE/BANDAGES/DRESSINGS) ×2 IMPLANT
GLOVE BIO SURGEON STRL SZ7.5 (GLOVE) ×2 IMPLANT
GLOVE INDICATOR 8.0 STRL GRN (GLOVE) ×2 IMPLANT
GOWN STRL REUS W/ TWL LRG LVL3 (GOWN DISPOSABLE) ×2 IMPLANT
GOWN STRL REUS W/TWL LRG LVL3 (GOWN DISPOSABLE) ×2
K-WIRE DBL END TROCAR 6X.045 (WIRE)
K-WIRE DBL END TROCAR 6X.062 (WIRE)
K-WIRE DBL TROCAR .062X4 ×2 IMPLANT
KIT ROOM TURNOVER OR (KITS) ×2 IMPLANT
KWIRE DBL END TROCAR 6X.045 (WIRE) IMPLANT
KWIRE DBL END TROCAR 6X.062 (WIRE) IMPLANT
KWIRE DBL TROCAR .062X4 ×1 IMPLANT
NS IRRIG 500ML POUR BTL (IV SOLUTION) ×2 IMPLANT
PACK EXTREMITY ARMC (MISCELLANEOUS) ×2 IMPLANT
PAD GROUND ADULT SPLIT (MISCELLANEOUS) ×2 IMPLANT
PIN BALLS 3/8 F/.045 WIRE (MISCELLANEOUS) ×2 IMPLANT
RASP SM TEAR CROSS CUT (RASP) ×2 IMPLANT
STAPLE SPR MET 9X9X9 1.5 (Staple) ×2 IMPLANT
STOCKINETTE IMPERVIOUS LG (DRAPES) ×2 IMPLANT
STRAP BODY AND KNEE 60X3 (MISCELLANEOUS) ×2 IMPLANT
STRIP CLOSURE SKIN 1/4X4 (GAUZE/BANDAGES/DRESSINGS) ×2 IMPLANT
SUT ETHILON 4-0 (SUTURE)
SUT ETHILON 4-0 FS2 18XMFL BLK (SUTURE)
SUT ETHILON 5-0 FS-2 18 BLK (SUTURE) IMPLANT
SUT MNCRL 5-0+ PC-1 (SUTURE) IMPLANT
SUT MONOCRYL 5-0 (SUTURE)
SUT VIC AB 2-0 SH 27 (SUTURE)
SUT VIC AB 2-0 SH 27XBRD (SUTURE) IMPLANT
SUT VIC AB 3-0 SH 27 (SUTURE)
SUT VIC AB 3-0 SH 27X BRD (SUTURE) IMPLANT
SUT VIC AB 4-0 FS2 27 (SUTURE) IMPLANT
SUTURE ETHLN 4-0 FS2 18XMF BLK (SUTURE) IMPLANT

## 2017-02-10 NOTE — Anesthesia Preprocedure Evaluation (Addendum)
Anesthesia Evaluation  Patient identified by MRN, date of birth, ID band Patient awake    Reviewed: Allergy & Precautions, H&P , NPO status , Patient's Chart, lab work & pertinent test results  Airway Mallampati: III  TM Distance: >3 FB Neck ROM: full    Dental no notable dental hx.    Pulmonary shortness of breath, sleep apnea ,    Pulmonary exam normal breath sounds clear to auscultation       Cardiovascular hypertension, + CAD and + Past MI  Normal cardiovascular exam Rhythm:regular Rate:Normal     Neuro/Psych PSYCHIATRIC DISORDERS  Neuromuscular disease    GI/Hepatic GERD  ,  Endo/Other  diabetesHypothyroidism Morbid obesity  Renal/GU Renal disease     Musculoskeletal   Abdominal   Peds  Hematology   Anesthesia Other Findings   Reproductive/Obstetrics                            Anesthesia Physical Anesthesia Plan  ASA: III  Anesthesia Plan: General   Post-op Pain Management:    Induction:   PONV Risk Score and Plan: 2 and Ondansetron, Dexamethasone and Midazolam  Airway Management Planned:   Additional Equipment:   Intra-op Plan:   Post-operative Plan:   Informed Consent: I have reviewed the patients History and Physical, chart, labs and discussed the procedure including the risks, benefits and alternatives for the proposed anesthesia with the patient or authorized representative who has indicated his/her understanding and acceptance.     Plan Discussed with: CRNA  Anesthesia Plan Comments:         Anesthesia Quick Evaluation

## 2017-02-10 NOTE — Op Note (Signed)
Operative note   Surgeon:Ritesh Opara Lawyer: None    Preop diagnosis: Right foot hallux valgus deformity    Postop diagnosis: Same    Procedure: Austin with Akin hallux valgus correction right foot    EBL: Minimal    Anesthesia:local and general    Hemostasis: Mid calf tourniquet inflated to 200 mmHg for approximately 57 minutes    Specimen: None    Complications: None    Operative indications:Charles [name] is an 56 y.o. that presents today for surgical intervention.  The risks/benefits/alternatives/complications have been discussed and consent has been given.    Procedure:  Patient was brought into the OR and placed on the operating table in thesupine position. After anesthesia was obtained theright lower extremity was prepped and draped in usual sterile fashion.  Attention was directed to the first MTPJ where a dorsal medial incision was performed.  Sharp and blunt dissection carried down to the capsule.  A T capsulotomy was then performed.  The prominent dorsomedial eminence was exposed.  Of note chronic gout was noted embedded within the articular cartilage on the dorsal medial eminence.  This was then transected with a power saw and smoothed with a power rasp.  Next a V osteotomy was created.  The capital fragment was translocated laterally.  There was still noted to be quite a bit of stiffness of the first MTPJ.  The intermetatarsal space was then entered.  The DTI L was transected.  The conjoined tendon of the abductor was noted and transected.  Better range of motion was noted.  The capital fragment was translocated back into a lateral position.  This was stabilized with a 0.062 K wire.  The ensuing overhanging ledge was then transected.  Residual valgus deformity of the great toe was noted.  Attention was directed to the base of the proximal phalanx.  A V osteotomy with the apex laterally was created.  The great toe was placed in a better rectus aligned position.  This  was stabilized with a small compression staple.  All wounds were flushed with copious amounts of irrigation.  A combination of 3-0 Vicryl for the capsular layer 4-0 Vicryl subcutaneous tissue and a 5-0 Monocryl in the subcuticular region was used to close the wound.  The area was infiltrated prior to surgical intervention with a 50:50 x  20 cc mixture of 0.25% bupivacaine and Exparel long-acting Marcaine.      Patient tolerated the procedure and anesthesia well.  Was transported from the OR to the PACU with all vital signs stable and vascular status intact. To be discharged per routine protocol.  Will follow up in approximately 1 week in the outpatient clinic.

## 2017-02-10 NOTE — Anesthesia Procedure Notes (Signed)
Procedure Name: Intubation Date/Time: 02/10/2017 1:48 PM Performed by: Londell Moh Pre-anesthesia Checklist: Patient identified, Emergency Drugs available, Suction available, Patient being monitored and Timeout performed Patient Re-evaluated:Patient Re-evaluated prior to induction Oxygen Delivery Method: Circle system utilized Preoxygenation: Pre-oxygenation with 100% oxygen Induction Type: IV induction Ventilation: Oral airway inserted - appropriate to patient size and Two handed mask ventilation required Grade View: Grade III Tube type: Oral Tube size: 7.5 mm Number of attempts: 1 Airway Equipment and Method: Rigid stylet,  Video-laryngoscopy and Oral airway Placement Confirmation: ETT inserted through vocal cords under direct vision,  positive ETCO2 and breath sounds checked- equal and bilateral Tube secured with: Tape Dental Injury: Teeth and Oropharynx as per pre-operative assessment  Difficulty Due To: Difficulty was anticipated

## 2017-02-10 NOTE — Transfer of Care (Signed)
Immediate Anesthesia Transfer of Care Note  Patient: Charles [name]  Procedure(s) Performed: METATARSAL OSTEOTOMY-GREAT TOE AND 1ST METATARSAL (Right )  Patient Location: PACU  Anesthesia Type: General  Level of Consciousness: awake, alert  and patient cooperative  Airway and Oxygen Therapy: Patient Spontanous Breathing and Patient connected to supplemental oxygen  Post-op Assessment: Post-op Vital signs reviewed, Patient's Cardiovascular Status Stable, Respiratory Function Stable, Patent Airway and No signs of Nausea or vomiting  Post-op Vital Signs: Reviewed and stable  Complications: No apparent anesthesia complications

## 2017-02-10 NOTE — Anesthesia Postprocedure Evaluation (Signed)
Anesthesia Post Note  Patient: Charles [name]  Procedure(s) Performed: METATARSAL OSTEOTOMY-GREAT TOE AND 1ST METATARSAL (Right )  Patient location during evaluation: PACU Anesthesia Type: General Level of consciousness: awake and alert and oriented Pain management: satisfactory to patient Vital Signs Assessment: post-procedure vital signs reviewed and stable Respiratory status: spontaneous breathing, nonlabored ventilation and respiratory function stable Cardiovascular status: blood pressure returned to baseline and stable Postop Assessment: Adequate PO intake and No signs of nausea or vomiting Anesthetic complications: no    Raliegh Ip

## 2017-02-10 NOTE — Transfer of Care (Deleted)
Immediate Anesthesia Transfer of Care Note  Patient: Charles [name]  Procedure(s) Performed: METATARSAL OSTEOTOMY-GREAT TOE AND 1ST METATARSAL (Right )  Patient Location: PACU  Anesthesia Type: General  Level of Consciousness: awake, alert  and patient cooperative  Airway and Oxygen Therapy: Patient Spontanous Breathing and Patient connected to supplemental oxygen  Post-op Assessment: Post-op Vital signs reviewed, Patient's Cardiovascular Status Stable, Respiratory Function Stable, Patent Airway and No signs of Nausea or vomiting  Post-op Vital Signs: Reviewed and stable  Complications: No apparent anesthesia complications

## 2017-02-10 NOTE — H&P (Signed)
HISTORY AND PHYSICAL INTERVAL NOTE:  02/10/2017  1:19 PM  Charles [name]  has presented today for surgery, with the diagnosis of Hallux Valgus-Right foot.  The various methods of treatment have been discussed with the patient.  No guarantees were given.  After consideration of risks, benefits and other options for treatment, the patient has consented to surgery.  I have reviewed the patients' chart and labs.    Patient Vitals for the past 24 hrs:  BP Temp Pulse Resp SpO2 Height Weight  02/10/17 1258 (!) 159/91 98.1 F (36.7 C) 86 16 97 % 6\' 2"  (1.88 m) (!) 140.2 kg (309 lb)    A history and physical examination was performed in my office.  The patient was reexamined.  There have been no changes to this history and physical examination.  Samara Deist A

## 2017-02-11 ENCOUNTER — Encounter: Payer: Self-pay | Admitting: Podiatry

## 2017-02-12 DIAGNOSIS — G894 Chronic pain syndrome: Secondary | ICD-10-CM | POA: Diagnosis not present

## 2017-02-12 DIAGNOSIS — M79674 Pain in right toe(s): Secondary | ICD-10-CM | POA: Diagnosis not present

## 2017-02-12 DIAGNOSIS — M25512 Pain in left shoulder: Secondary | ICD-10-CM | POA: Diagnosis not present

## 2017-02-12 DIAGNOSIS — M25572 Pain in left ankle and joints of left foot: Secondary | ICD-10-CM | POA: Diagnosis not present

## 2017-02-15 DIAGNOSIS — Z23 Encounter for immunization: Secondary | ICD-10-CM | POA: Diagnosis not present

## 2017-02-16 ENCOUNTER — Other Ambulatory Visit: Payer: Self-pay | Admitting: Podiatry

## 2017-02-16 ENCOUNTER — Encounter: Payer: Self-pay | Admitting: *Deleted

## 2017-02-16 NOTE — Anesthesia Preprocedure Evaluation (Addendum)
Anesthesia Evaluation  Patient identified by MRN, date of birth, ID band Patient awake    Reviewed: Allergy & Precautions, NPO status   Airway Mallampati: III  TM Distance: >3 FB    Comment: Large neck circumference Dental   Pulmonary sleep apnea ,    breath sounds clear to auscultation       Cardiovascular hypertension, + CAD and + Past MI   Rhythm:Regular Rate:Normal     Neuro/Psych Anxiety Depression  Neuromuscular disease    GI/Hepatic GERD  ,  Endo/Other  diabetes, Type 2Hypothyroidism Morbid obesity (BMI 41)  Renal/GU      Musculoskeletal  (+) Arthritis ,   Abdominal   Peds  Hematology  (+) anemia ,   Anesthesia Other Findings   Reproductive/Obstetrics                                                            Anesthesia Evaluation  Patient identified by MRN, date of birth, ID band Patient awake    Reviewed: Allergy & Precautions, H&P , NPO status , Patient's Chart, lab work & pertinent test results  Airway Mallampati: III  TM Distance: >3 FB Neck ROM: full    Dental no notable dental hx.    Pulmonary shortness of breath, sleep apnea ,    Pulmonary exam normal breath sounds clear to auscultation       Cardiovascular hypertension, + CAD and + Past MI  Normal cardiovascular exam Rhythm:regular Rate:Normal     Neuro/Psych PSYCHIATRIC DISORDERS  Neuromuscular disease    GI/Hepatic GERD  ,  Endo/Other  diabetesHypothyroidism Morbid obesity  Renal/GU Renal disease     Musculoskeletal   Abdominal   Peds  Hematology   Anesthesia Other Findings   Reproductive/Obstetrics                            Anesthesia Physical Anesthesia Plan  ASA: III  Anesthesia Plan: General   Post-op Pain Management:    Induction:   PONV Risk Score and Plan: 2 and Ondansetron, Dexamethasone and Midazolam  Airway Management Planned:    Additional Equipment:   Intra-op Plan:   Post-operative Plan:   Informed Consent: I have reviewed the patients History and Physical, chart, labs and discussed the procedure including the risks, benefits and alternatives for the proposed anesthesia with the patient or authorized representative who has indicated his/her understanding and acceptance.     Plan Discussed with: CRNA  Anesthesia Plan Comments:         Anesthesia Quick Evaluation  Anesthesia Physical Anesthesia Plan  ASA: III  Anesthesia Plan: General and Regional   Post-op Pain Management: GA combined w/ Regional for post-op pain   Induction: Intravenous  PONV Risk Score and Plan:   Airway Management Planned: Nasal Cannula and Natural Airway  Additional Equipment:   Intra-op Plan:   Post-operative Plan:   Informed Consent: I have reviewed the patients History and Physical, chart, labs and discussed the procedure including the risks, benefits and alternatives for the proposed anesthesia with the patient or authorized representative who has indicated his/her understanding and acceptance.     Plan Discussed with: CRNA  Anesthesia Plan Comments:        Anesthesia Quick Evaluation

## 2017-02-17 ENCOUNTER — Ambulatory Visit
Admission: RE | Admit: 2017-02-17 | Discharge: 2017-02-17 | Disposition: A | Discharge status: Home / Self Care | Payer: BLUE CROSS/BLUE SHIELD | Source: Ambulatory Visit | Attending: Podiatry | Admitting: Podiatry

## 2017-02-17 ENCOUNTER — Ambulatory Visit: Payer: BLUE CROSS/BLUE SHIELD | Admitting: Certified Registered"

## 2017-02-17 ENCOUNTER — Encounter
Admission: RE | Disposition: A | Discharge status: Home / Self Care | Payer: Self-pay | Source: Ambulatory Visit | Attending: Podiatry

## 2017-02-17 DIAGNOSIS — M1990 Unspecified osteoarthritis, unspecified site: Secondary | ICD-10-CM | POA: Insufficient documentation

## 2017-02-17 DIAGNOSIS — I10 Essential (primary) hypertension: Secondary | ICD-10-CM | POA: Insufficient documentation

## 2017-02-17 DIAGNOSIS — S92311P Displaced fracture of first metatarsal bone, right foot, subsequent encounter for fracture with malunion: Secondary | ICD-10-CM | POA: Diagnosis not present

## 2017-02-17 DIAGNOSIS — M2011 Hallux valgus (acquired), right foot: Secondary | ICD-10-CM | POA: Insufficient documentation

## 2017-02-17 DIAGNOSIS — K219 Gastro-esophageal reflux disease without esophagitis: Secondary | ICD-10-CM | POA: Insufficient documentation

## 2017-02-17 DIAGNOSIS — G4733 Obstructive sleep apnea (adult) (pediatric): Secondary | ICD-10-CM | POA: Insufficient documentation

## 2017-02-17 DIAGNOSIS — E782 Mixed hyperlipidemia: Secondary | ICD-10-CM | POA: Insufficient documentation

## 2017-02-17 DIAGNOSIS — Z7982 Long term (current) use of aspirin: Secondary | ICD-10-CM | POA: Insufficient documentation

## 2017-02-17 DIAGNOSIS — Z9884 Bariatric surgery status: Secondary | ICD-10-CM | POA: Diagnosis not present

## 2017-02-17 DIAGNOSIS — S92311A Displaced fracture of first metatarsal bone, right foot, initial encounter for closed fracture: Secondary | ICD-10-CM | POA: Diagnosis not present

## 2017-02-17 DIAGNOSIS — M84374A Stress fracture, right foot, initial encounter for fracture: Secondary | ICD-10-CM | POA: Diagnosis not present

## 2017-02-17 DIAGNOSIS — W19XXXA Unspecified fall, initial encounter: Secondary | ICD-10-CM | POA: Diagnosis not present

## 2017-02-17 DIAGNOSIS — F419 Anxiety disorder, unspecified: Secondary | ICD-10-CM | POA: Insufficient documentation

## 2017-02-17 DIAGNOSIS — Z888 Allergy status to other drugs, medicaments and biological substances status: Secondary | ICD-10-CM | POA: Insufficient documentation

## 2017-02-17 DIAGNOSIS — Z8249 Family history of ischemic heart disease and other diseases of the circulatory system: Secondary | ICD-10-CM | POA: Diagnosis not present

## 2017-02-17 DIAGNOSIS — I2510 Atherosclerotic heart disease of native coronary artery without angina pectoris: Secondary | ICD-10-CM | POA: Insufficient documentation

## 2017-02-17 DIAGNOSIS — Z79899 Other long term (current) drug therapy: Secondary | ICD-10-CM | POA: Diagnosis not present

## 2017-02-17 DIAGNOSIS — F329 Major depressive disorder, single episode, unspecified: Secondary | ICD-10-CM | POA: Diagnosis not present

## 2017-02-17 DIAGNOSIS — Z6841 Body Mass Index (BMI) 40.0 and over, adult: Secondary | ICD-10-CM | POA: Diagnosis not present

## 2017-02-17 DIAGNOSIS — I252 Old myocardial infarction: Secondary | ICD-10-CM | POA: Diagnosis not present

## 2017-02-17 HISTORY — PX: ORIF TOE FRACTURE: SHX5032

## 2017-02-17 SURGERY — OPEN REDUCTION INTERNAL FIXATION (ORIF) METATARSAL (TOE) FRACTURE
Anesthesia: Regional | Laterality: Right | Wound class: Clean Contaminated

## 2017-02-17 MED ORDER — LACTATED RINGERS IV SOLN
10.0000 mL/h | INTRAVENOUS | Status: DC
  Administered 2017-02-17: 10 mL/h via INTRAVENOUS

## 2017-02-17 MED ORDER — FENTANYL CITRATE (PF) 100 MCG/2ML IJ SOLN: 25.0000 ug | INTRAMUSCULAR | Status: DC | PRN

## 2017-02-17 MED ORDER — ONDANSETRON HCL 4 MG PO TABS: 4.0000 mg | ORAL_TABLET | Freq: Four times a day (QID) | ORAL | Status: DC | PRN

## 2017-02-17 MED ORDER — LIDOCAINE HCL (CARDIAC) 20 MG/ML IV SOLN
INTRAVENOUS | Status: DC | PRN
  Administered 2017-02-17: 50 mg via INTRAVENOUS

## 2017-02-17 MED ORDER — ACETAMINOPHEN 160 MG/5ML PO SOLN: 325.0000 mg | ORAL | Status: DC | PRN

## 2017-02-17 MED ORDER — OXYCODONE HCL 5 MG PO TABS: 5.0000 mg | ORAL_TABLET | Freq: Once | ORAL | Status: DC | PRN

## 2017-02-17 MED ORDER — LIDOCAINE HCL (PF) 1 % IJ SOLN: INTRAMUSCULAR | Status: DC | PRN

## 2017-02-17 MED ORDER — PROMETHAZINE HCL 25 MG/ML IJ SOLN: 6.2500 mg | INTRAMUSCULAR | Status: DC | PRN

## 2017-02-17 MED ORDER — LIDOCAINE HCL 2 % IJ SOLN
INTRAMUSCULAR | Status: DC | PRN
  Administered 2017-02-17: 5 mL

## 2017-02-17 MED ORDER — FENTANYL CITRATE (PF) 100 MCG/2ML IJ SOLN
INTRAMUSCULAR | Status: DC | PRN
  Administered 2017-02-17 (×2): 50 ug via INTRAVENOUS

## 2017-02-17 MED ORDER — MIDAZOLAM HCL 2 MG/2ML IJ SOLN
INTRAMUSCULAR | Status: DC | PRN
  Administered 2017-02-17: 2 mg via INTRAVENOUS

## 2017-02-17 MED ORDER — ROPIVACAINE HCL 5 MG/ML IJ SOLN
INTRAMUSCULAR | Status: DC | PRN
  Administered 2017-02-17: 20 mL via PERINEURAL
  Administered 2017-02-17: 40 mL via PERINEURAL

## 2017-02-17 MED ORDER — CEFAZOLIN SODIUM-DEXTROSE 2-4 GM/100ML-% IV SOLN
2.0000 g | INTRAVENOUS | Status: AC
  Administered 2017-02-17: 2 g via INTRAVENOUS

## 2017-02-17 MED ORDER — PROPOFOL 500 MG/50ML IV EMUL
INTRAVENOUS | Status: DC | PRN
  Administered 2017-02-17: 100 ug/kg/min via INTRAVENOUS

## 2017-02-17 MED ORDER — POVIDONE-IODINE 7.5 % EX SOLN: Freq: Once | CUTANEOUS | Status: DC

## 2017-02-17 MED ORDER — OXYCODONE HCL 5 MG/5ML PO SOLN: 5.0000 mg | Freq: Once | ORAL | Status: DC | PRN

## 2017-02-17 MED ORDER — ONDANSETRON HCL 4 MG/2ML IJ SOLN: 4.0000 mg | Freq: Four times a day (QID) | INTRAMUSCULAR | Status: DC | PRN

## 2017-02-17 MED ORDER — BUPIVACAINE HCL (PF) 0.25 % IJ SOLN
INTRAMUSCULAR | Status: DC | PRN
  Administered 2017-02-17: 5 mL

## 2017-02-17 MED ORDER — ACETAMINOPHEN 325 MG PO TABS: 325.0000 mg | ORAL_TABLET | ORAL | Status: DC | PRN

## 2017-02-17 SURGICAL SUPPLY — 45 items
BANDAGE ELASTIC 4 LF NS (GAUZE/BANDAGES/DRESSINGS) ×2 IMPLANT
BIT DRILL 1.6MM (DRILL) ×1 IMPLANT
BNDG COHESIVE 4X5 TAN STRL (GAUZE/BANDAGES/DRESSINGS) ×2 IMPLANT
BNDG ESMARK 4X12 TAN STRL LF (GAUZE/BANDAGES/DRESSINGS) ×2 IMPLANT
BNDG GAUZE 4.5X4.1 6PLY STRL (MISCELLANEOUS) ×2 IMPLANT
BNDG STRETCH 4X75 STRL LF (GAUZE/BANDAGES/DRESSINGS) ×2 IMPLANT
CANISTER SUCT 1200ML W/VALVE (MISCELLANEOUS) ×2 IMPLANT
CUFF TOURN SGL QUICK 18 (TOURNIQUET CUFF) ×2 IMPLANT
DRAPE FLUOR MINI C-ARM 54X84 (DRAPES) ×2 IMPLANT
DRILL 1.6MM (DRILL) ×2
DURAPREP 26ML APPLICATOR (WOUND CARE) ×2 IMPLANT
GAUZE PETRO XEROFOAM 1X8 (MISCELLANEOUS) ×2 IMPLANT
GAUZE SPONGE 4X4 12PLY STRL (GAUZE/BANDAGES/DRESSINGS) ×2 IMPLANT
GLOVE BIO SURGEON STRL SZ7.5 (GLOVE) ×2 IMPLANT
GLOVE INDICATOR 8.0 STRL GRN (GLOVE) ×2 IMPLANT
GOWN STRL REUS W/ TWL LRG LVL3 (GOWN DISPOSABLE) ×2 IMPLANT
GOWN STRL REUS W/TWL LRG LVL3 (GOWN DISPOSABLE) ×2
K-WIRE, 1.2MM X 100MM ×4 IMPLANT
KIT ROOM TURNOVER OR (KITS) ×2 IMPLANT
Locking plate screw (Screw) ×2 IMPLANT
NEEDLE FILTER BLUNT 18X 1/2SAF (NEEDLE) ×1
NEEDLE FILTER BLUNT 18X1 1/2 (NEEDLE) ×1 IMPLANT
NS IRRIG 500ML POUR BTL (IV SOLUTION) ×2 IMPLANT
Nonlocking plate screw 2.5 x 24mm (Screw) ×2 IMPLANT
Nonlocking plate screw 2.5 x 26mm (Screw) ×2 IMPLANT
Nonlocking plate screw 2.5 x 30mm (Screw) ×2 IMPLANT
Oblique L Plate, compression, 7-hole (Plate) ×2 IMPLANT
PACK EXTREMITY ARMC (MISCELLANEOUS) ×2 IMPLANT
PAD GROUND ADULT SPLIT (MISCELLANEOUS) ×2 IMPLANT
PUTTY DBM OPTIUM 1CC (Bone Implant) ×2 IMPLANT
SCREW LOCKING 2.5X16 (Screw) ×4 IMPLANT
SCREW LOCKING 2.5X18 (Screw) ×2 IMPLANT
SCREW NON LOCKING 2.5X18 (Screw) ×2 IMPLANT
STOCKINETTE IMPERVIOUS 9X36 MD (GAUZE/BANDAGES/DRESSINGS) ×2 IMPLANT
STOCKINETTE IMPERVIOUS LG (DRAPES) ×2 IMPLANT
STRAP BODY AND KNEE 60X3 (MISCELLANEOUS) ×2 IMPLANT
STRIP CLOSURE SKIN 1/4X4 (GAUZE/BANDAGES/DRESSINGS) ×2 IMPLANT
SUT ETHILON 4-0 (SUTURE) ×2
SUT ETHILON 4-0 FS2 18XMFL BLK (SUTURE) ×2
SUT VIC AB 4-0 FS2 27 (SUTURE) ×2 IMPLANT
SUT VICRYL AB 3-0 FS1 BRD 27IN (SUTURE) ×2 IMPLANT
SUTURE ETHLN 4-0 FS2 18XMF BLK (SUTURE) ×2 IMPLANT
SYRINGE 10CC LL (SYRINGE) ×2 IMPLANT
locking plate screw 2.5 x 20 (Screw) ×8 IMPLANT
oblique 8 hole t-plate (Plate) ×2 IMPLANT

## 2017-02-17 NOTE — Op Note (Signed)
Operative note   Surgeon:Macon Lesesne Lawyer: None    Preop diagnosis: Displaced first metatarsal osteotomy with fracture osteotomy site right foot    Postop diagnosis: Same    Procedure: Open reduction with internal fixation displaced osteotomy and fracture first metatarsal    EBL: Minimal    Anesthesia:regional and IV sedation    Hemostasis: Mid calf tourniquet inflated to 200 mmHg for 90 minutes    Specimen: None    Complications: Displaced comminuted fracture osteotomy first metatarsal    Operative indications:Charles [name] is an 56 y.o. that presents today for surgical intervention.  The risks/benefits/alternatives/complications have been discussed and consent has been given.    Procedure:  Patient was brought into the OR and placed on the operating table in thesupine position. After anesthesia was obtained theright lower extremity was prepped and draped in usual sterile fashion.  Attention was directed to the previous surgical site where a dorsal medial incision had previously been performed.  All sutures were removed.  Blunt dissection carried down to the capsule.  The capsule was then entered.  At this time the head of the metatarsal was then exposed.  This showed a laterally and dorsally and distal fragment of a first metatarsal osteotomy previously created by myself last week.  There was some comminution at the proximal aspect of the dorsal arm of the fracture site as well as within the medullary area of the osteotomy site.  The dorsal medial and lateral capsule was fully released at this time.  This exposed the entire metatarsophalangeal joint.  Removal of comminution and soft tissue from the fracture site was performed.  With distal distraction I was able to finally realigned and reduce the osteotomy back to previous good line position.  At this time 2 plates were used to stabilize the fracture.  A dorsal and medial plate was used.  Multiple 2.0 millimeter screws in  the 2.0 mm locking plate was used proximal and distal to the fracture site.  Excellent alignment was visualized on fluoroscopy.  This came from the [facility] 28 small plate set.  There was an area of comminution at the fracture site that after plating I placed 1 mL of DBM putty.  Prior to this wound was infiltrated with saline.  Bone grafting was placed at the osteotomy site where the comminution had been noted.  Layer closure was performed with a 3-0 Vicryl the capsule 4-0 Vicryl for the subcutaneous tissue and a 4-0 nylon for skin.  Patient was then placed in a posterior splint for nonweightbearing.    Patient tolerated the procedure and anesthesia well.  Was transported from the OR to the PACU with all vital signs stable and vascular status intact. To be discharged per routine protocol.  Will follow up in approximately 1 week in the outpatient clinic.

## 2017-02-17 NOTE — Progress Notes (Signed)
Assisted Vernard Gambles with right, ultrasound guided, popliteal/saphenous block. Side rails up, monitors on throughout procedure. See vital signs in flow sheet. Tolerated Procedure well.

## 2017-02-17 NOTE — H&P (Signed)
  HISTORY AND PHYSICAL INTERVAL NOTE:  02/17/2017  2:47 PM  Charles [name]  has presented today for surgery, with the diagnosis of Displaced bone right great toe joint.  The various methods of treatment have been discussed with the patient.  No guarantees were given.  After consideration of risks, benefits and other options for treatment, the patient has consented to surgery.  I have reviewed the patients' chart and labs.    Patient Vitals for the past 24 hrs:  BP Temp Temp src Pulse Resp SpO2 Height Weight  02/17/17 1425 129/85 - - 79 10 98 % - -  02/17/17 1420 129/77 - - 81 (!) 8 98 % - -  02/17/17 1415 138/85 - - 82 10 98 % - -  02/17/17 1410 (!) 144/85 - - 83 (!) 8 98 % - -  02/17/17 1355 114/70 - - 77 13 97 % - -  02/17/17 1350 122/71 - - 76 (!) 8 96 % - -  02/17/17 1345 - - - 79 13 97 % - -  02/17/17 1307 126/87 98.8 F (37.1 C) Temporal 80 16 95 % 6\' 2"  (1.88 m) (!) 143.8 kg (317 lb)    A history and physical examination was performed in my office.  The patient was reexamined.  There have been no changes to this history and physical examination.  Samara Deist A

## 2017-02-17 NOTE — Anesthesia Procedure Notes (Signed)
Anesthesia Regional Block: Adductor canal block   Pre-Anesthetic Checklist: ,, timeout performed, Correct Patient, Correct Site, Correct Laterality, Correct Procedure, Correct Position, site marked, Risks and benefits discussed,  Surgical consent,  Pre-op evaluation,  At surgeon's request and post-op pain management  Laterality: Right  Prep: chloraprep       Needles:  Injection technique: Single-shot  Needle Type: Stimiplex     Needle Length: 10cm  Needle Gauge: 21     Additional Needles:   Procedures:,,,, ultrasound used (permanent image in chart),,,,  Narrative:  Start time: 02/17/2017 1:58 PM End time: 02/17/2017 2:02 PM Injection made incrementally with aspirations every 5 mL.  Events: blood aspirated,,,,,,,,,,  Performed by: Personally  Anesthesiologist: Veda Canning  Additional Notes: 61mL of 0.5% ropivacaine injected incrementally under ultrasound guidance. No immediate complications. Veda Canning, MD

## 2017-02-17 NOTE — Discharge Instructions (Signed)
[place] DR. Bee   1. Take your medication as prescribed.  Pain medication should be taken only as needed.  2. Keep the dressing clean, dry and intact.  3. Keep your foot elevated above the heart level for the first 48 hours.  4. Walking to the bathroom and brief periods of walking are acceptable, unless we have instructed you to be non-weight bearing.  5.   Always use your crutches, you are to be non-weight bearing.  6. Do not take a shower. Baths are permissible as long as the foot is kept out of the water.   7. Every hour you are awake:  - Bend your knee 15 times.   8. Call [facility] [phone]) if any of the following problems occur: - You develop a temperature or fever. - The bandage becomes saturated with blood. - Medication does not stop your pain. - Injury of the foot occurs. - Any symptoms of infection including redness, odor, or red streaks running from wound.   General Anesthesia, Adult, Care After These instructions provide you with information about caring for yourself after your procedure. Your health care provider may also give you more specific instructions. Your treatment has been planned according to current medical practices, but problems sometimes occur. Call your health care provider if you have any problems or questions after your procedure. What can I expect after the procedure? After the procedure, it is common to have:  Vomiting.  A sore throat.  Mental slowness.  It is common to feel:  Nauseous.  Cold or shivery.  Sleepy.  Tired.  Sore or achy, even in parts of your body where you did not have surgery.  Follow these instructions at home: For at least 24 hours after the procedure:  Do not: ? Participate in activities where you could fall or become injured. ? Drive. ? Use heavy  machinery. ? Drink alcohol. ? Take sleeping pills or medicines that cause drowsiness. ? Make important decisions or sign legal documents. ? Take care of children on your own.  Rest. Eating and drinking  If you vomit, drink water, juice, or soup when you can drink without vomiting.  Drink enough fluid to keep your urine clear or pale yellow.  Make sure you have little or no nausea before eating solid foods.  Follow the diet recommended by your health care provider. General instructions  Have a responsible adult stay with you until you are awake and alert.  Return to your normal activities as told by your health care provider. Ask your health care provider what activities are safe for you.  Take over-the-counter and prescription medicines only as told by your health care provider.  If you smoke, do not smoke without supervision.  Keep all follow-up visits as told by your health care provider. This is important. Contact a health care provider if:  You continue to have nausea or vomiting at home, and medicines are not helpful.  You cannot drink fluids or start eating again.  You cannot urinate after 8-12 hours.  You develop a skin rash.  You have fever.  You have increasing redness at the site of your procedure. Get help right away if:  You have difficulty breathing.  You have chest pain.  You have unexpected bleeding.  You feel that you are having a life-threatening or urgent problem. This information is not intended to replace advice given  to you by your health care provider. Make sure you discuss any questions you have with your health care provider. Document Released: 07/13/2000 Document Revised: 09/09/2015 Document Reviewed: 03/21/2015 Elsevier Interactive Patient Education  [employer].

## 2017-02-17 NOTE — Anesthesia Procedure Notes (Signed)
Anesthesia Regional Block: Popliteal block   Pre-Anesthetic Checklist: ,, timeout performed, Correct Patient, Correct Site, Correct Laterality, Correct Procedure, Correct Position, site marked, Risks and benefits discussed,  Surgical consent,  Pre-op evaluation,  At surgeon's request and post-op pain management  Laterality: Right  Prep: chloraprep       Needles:  Injection technique: Single-shot  Needle Type: Stimiplex     Needle Length: 10cm  Needle Gauge: 21     Additional Needles:   Procedures:,,,, ultrasound used (permanent image in chart),,,,  Narrative:  Start time: 02/17/2017 1:50 PM End time: 02/17/2017 1:56 PM Injection made incrementally with aspirations every 5 mL.  Performed by: Personally  Anesthesiologist: Veda Canning  Additional Notes: 33mL of 0.5% ropivacaine injected incrementally under ultrasound guidance. No immediate complications. Veda Canning, MD

## 2017-02-17 NOTE — Anesthesia Postprocedure Evaluation (Signed)
Anesthesia Post Note  Patient: Charles [name]  Procedure(s) Performed: Open reduction with internal fixation displaced osteotomy and fracture first metatarsal (Right )  Patient location during evaluation: PACU Anesthesia Type: Regional Level of consciousness: awake Pain management: pain level controlled Vital Signs Assessment: post-procedure vital signs reviewed and stable Respiratory status: respiratory function stable Cardiovascular status: stable Postop Assessment: no signs of nausea or vomiting Anesthetic complications: no    Veda Canning

## 2017-02-17 NOTE — Anesthesia Procedure Notes (Signed)
Performed by: Londell Moh Pre-anesthesia Checklist: Patient identified, Emergency Drugs available, Suction available, Timeout performed and Patient being monitored Patient Re-evaluated:Patient Re-evaluated prior to induction Oxygen Delivery Method: Simple face mask Placement Confirmation: positive ETCO2

## 2017-02-17 NOTE — Transfer of Care (Signed)
Immediate Anesthesia Transfer of Care Note  Patient: Charles [name]  Procedure(s) Performed: OPEN REDUCTION INTERNAL FIXATION (ORIF) METATARSAL (TOE) FRACTURE (Right )  Patient Location: PACU  Anesthesia Type: General, Regional  Level of Consciousness: awake, alert  and patient cooperative  Airway and Oxygen Therapy: Patient Spontanous Breathing and Patient connected to supplemental oxygen  Post-op Assessment: Post-op Vital signs reviewed, Patient's Cardiovascular Status Stable, Respiratory Function Stable, Patent Airway and No signs of Nausea or vomiting  Post-op Vital Signs: Reviewed and stable  Complications: No apparent anesthesia complications

## 2017-02-19 ENCOUNTER — Inpatient Hospital Stay
Admission: AD | Admit: 2017-02-19 | Discharge: 2017-02-26 | DRG: 857 | Disposition: A | Discharge status: Home Health | Payer: BLUE CROSS/BLUE SHIELD | Source: Ambulatory Visit | Attending: Internal Medicine | Admitting: Internal Medicine

## 2017-02-19 ENCOUNTER — Other Ambulatory Visit
Admission: RE | Admit: 2017-02-19 | Discharge: 2017-02-19 | Disposition: A | Discharge status: Home / Self Care | Payer: BLUE CROSS/BLUE SHIELD | Source: Ambulatory Visit | Attending: Podiatry | Admitting: Podiatry

## 2017-02-19 ENCOUNTER — Encounter: Payer: Self-pay | Admitting: Podiatry

## 2017-02-19 DIAGNOSIS — T8149XA Infection following a procedure, other surgical site, initial encounter: Secondary | ICD-10-CM | POA: Diagnosis not present

## 2017-02-19 DIAGNOSIS — L089 Local infection of the skin and subcutaneous tissue, unspecified: Secondary | ICD-10-CM | POA: Diagnosis present

## 2017-02-19 DIAGNOSIS — E1169 Type 2 diabetes mellitus with other specified complication: Secondary | ICD-10-CM

## 2017-02-19 DIAGNOSIS — E039 Hypothyroidism, unspecified: Secondary | ICD-10-CM | POA: Diagnosis not present

## 2017-02-19 DIAGNOSIS — L03115 Cellulitis of right lower limb: Secondary | ICD-10-CM | POA: Diagnosis not present

## 2017-02-19 DIAGNOSIS — G4730 Sleep apnea, unspecified: Secondary | ICD-10-CM | POA: Diagnosis present

## 2017-02-19 DIAGNOSIS — Z6841 Body Mass Index (BMI) 40.0 and over, adult: Secondary | ICD-10-CM | POA: Diagnosis not present

## 2017-02-19 DIAGNOSIS — G4700 Insomnia, unspecified: Secondary | ICD-10-CM | POA: Diagnosis not present

## 2017-02-19 DIAGNOSIS — F419 Anxiety disorder, unspecified: Secondary | ICD-10-CM | POA: Diagnosis not present

## 2017-02-19 DIAGNOSIS — K219 Gastro-esophageal reflux disease without esophagitis: Secondary | ICD-10-CM | POA: Diagnosis present

## 2017-02-19 DIAGNOSIS — T8140XA Infection following a procedure, unspecified, initial encounter: Secondary | ICD-10-CM | POA: Insufficient documentation

## 2017-02-19 DIAGNOSIS — Z9884 Bariatric surgery status: Secondary | ICD-10-CM

## 2017-02-19 DIAGNOSIS — F329 Major depressive disorder, single episode, unspecified: Secondary | ICD-10-CM | POA: Diagnosis present

## 2017-02-19 DIAGNOSIS — E291 Testicular hypofunction: Secondary | ICD-10-CM | POA: Diagnosis present

## 2017-02-19 DIAGNOSIS — L02619 Cutaneous abscess of unspecified foot: Secondary | ICD-10-CM

## 2017-02-19 DIAGNOSIS — I2510 Atherosclerotic heart disease of native coronary artery without angina pectoris: Secondary | ICD-10-CM | POA: Diagnosis not present

## 2017-02-19 DIAGNOSIS — I10 Essential (primary) hypertension: Secondary | ICD-10-CM | POA: Diagnosis not present

## 2017-02-19 DIAGNOSIS — M545 Low back pain: Secondary | ICD-10-CM | POA: Diagnosis present

## 2017-02-19 DIAGNOSIS — Z8711 Personal history of peptic ulcer disease: Secondary | ICD-10-CM | POA: Diagnosis not present

## 2017-02-19 DIAGNOSIS — I25119 Atherosclerotic heart disease of native coronary artery with unspecified angina pectoris: Secondary | ICD-10-CM | POA: Diagnosis not present

## 2017-02-19 DIAGNOSIS — E1165 Type 2 diabetes mellitus with hyperglycemia: Secondary | ICD-10-CM | POA: Diagnosis present

## 2017-02-19 DIAGNOSIS — I252 Old myocardial infarction: Secondary | ICD-10-CM

## 2017-02-19 DIAGNOSIS — Z7982 Long term (current) use of aspirin: Secondary | ICD-10-CM

## 2017-02-19 DIAGNOSIS — Y838 Other surgical procedures as the cause of abnormal reaction of the patient, or of later complication, without mention of misadventure at the time of the procedure: Secondary | ICD-10-CM | POA: Diagnosis present

## 2017-02-19 DIAGNOSIS — M869 Osteomyelitis, unspecified: Secondary | ICD-10-CM | POA: Diagnosis not present

## 2017-02-19 DIAGNOSIS — M79671 Pain in right foot: Secondary | ICD-10-CM | POA: Diagnosis present

## 2017-02-19 DIAGNOSIS — G894 Chronic pain syndrome: Secondary | ICD-10-CM | POA: Diagnosis present

## 2017-02-19 DIAGNOSIS — Z79899 Other long term (current) drug therapy: Secondary | ICD-10-CM | POA: Diagnosis not present

## 2017-02-19 DIAGNOSIS — L03119 Cellulitis of unspecified part of limb: Secondary | ICD-10-CM

## 2017-02-19 DIAGNOSIS — M7989 Other specified soft tissue disorders: Secondary | ICD-10-CM | POA: Diagnosis not present

## 2017-02-19 DIAGNOSIS — T8131XA Disruption of external operation (surgical) wound, not elsewhere classified, initial encounter: Secondary | ICD-10-CM | POA: Diagnosis present

## 2017-02-19 DIAGNOSIS — E669 Obesity, unspecified: Secondary | ICD-10-CM | POA: Diagnosis present

## 2017-02-19 DIAGNOSIS — R5381 Other malaise: Secondary | ICD-10-CM | POA: Diagnosis not present

## 2017-02-19 DIAGNOSIS — T8469XA Infection and inflammatory reaction due to internal fixation device of other site, initial encounter: Secondary | ICD-10-CM | POA: Diagnosis not present

## 2017-02-19 DIAGNOSIS — M549 Dorsalgia, unspecified: Secondary | ICD-10-CM | POA: Diagnosis not present

## 2017-02-19 DIAGNOSIS — S99921A Unspecified injury of right foot, initial encounter: Secondary | ICD-10-CM | POA: Diagnosis not present

## 2017-02-19 LAB — BASIC METABOLIC PANEL
Anion gap: 9 (ref 5–15)
BUN: 13 mg/dL (ref 6–20)
CALCIUM: 8.9 mg/dL (ref 8.9–10.3)
CO2: 25 mmol/L (ref 22–32)
CREATININE: 1.02 mg/dL (ref 0.61–1.24)
Chloride: 99 mmol/L — ABNORMAL LOW (ref 101–111)
GFR calc Af Amer: >60 mL/min (ref >60)
Glucose, Bld: 146 mg/dL — ABNORMAL HIGH (ref 65–99)
Potassium: 3.6 mmol/L (ref 3.5–5.1)
SODIUM: 133 mmol/L — AB (ref 135–145)

## 2017-02-19 LAB — CBC
HCT: 46.2 % (ref 40.0–52.0)
Hemoglobin: 15.4 g/dL (ref 13.0–18.0)
MCH: 30.2 pg (ref 26.0–34.0)
MCHC: 33.3 g/dL (ref 32.0–36.0)
MCV: 90.7 fL (ref 80.0–100.0)
PLATELETS: 177 10*3/uL (ref 150–440)
RBC: 5.09 MIL/uL (ref 4.40–5.90)
RDW: 15.8 % — AB (ref 11.5–14.5)
WBC: 11.2 10*3/uL — ABNORMAL HIGH (ref 3.8–10.6)

## 2017-02-19 MED ORDER — OXYCODONE HCL 5 MG PO TABS
10.0000 mg | ORAL_TABLET | Freq: Every day | ORAL | Status: DC
  Administered 2017-02-19 – 2017-02-23 (×13): 10 mg via ORAL
  Filled 2017-02-19 (×13): qty 2

## 2017-02-19 MED ORDER — BUSPIRONE HCL 10 MG PO TABS
30.0000 mg | ORAL_TABLET | Freq: Two times a day (BID) | ORAL | Status: DC
  Administered 2017-02-19 – 2017-02-26 (×13): 30 mg via ORAL
  Filled 2017-02-19 (×13): qty 3

## 2017-02-19 MED ORDER — RISAQUAD PO CAPS
1.0000 | ORAL_CAPSULE | Freq: Every day | ORAL | Status: DC
  Administered 2017-02-20 – 2017-02-26 (×6): 1 via ORAL
  Filled 2017-02-19 (×8): qty 1

## 2017-02-19 MED ORDER — BISACODYL 5 MG PO TBEC: 5.0000 mg | DELAYED_RELEASE_TABLET | Freq: Every day | ORAL | Status: DC | PRN

## 2017-02-19 MED ORDER — ACETAMINOPHEN 325 MG PO TABS: 650.0000 mg | ORAL_TABLET | Freq: Four times a day (QID) | ORAL | Status: DC | PRN

## 2017-02-19 MED ORDER — VANCOMYCIN HCL 10 G IV SOLR
1250.0000 mg | Freq: Once | INTRAVENOUS | Status: AC
  Administered 2017-02-19: 1250 mg via INTRAVENOUS
  Filled 2017-02-19: qty 1250

## 2017-02-19 MED ORDER — ADULT MULTIVITAMIN W/MINERALS CH
1.0000 | ORAL_TABLET | Freq: Every day | ORAL | Status: DC
  Administered 2017-02-20 – 2017-02-25 (×5): 1 via ORAL
  Filled 2017-02-19 (×6): qty 1

## 2017-02-19 MED ORDER — ATORVASTATIN CALCIUM 20 MG PO TABS
40.0000 mg | ORAL_TABLET | Freq: Every day | ORAL | Status: DC
  Administered 2017-02-20 – 2017-02-25 (×6): 40 mg via ORAL
  Filled 2017-02-19 (×5): qty 2

## 2017-02-19 MED ORDER — VANCOMYCIN HCL 10 G IV SOLR
1250.0000 mg | Freq: Three times a day (TID) | INTRAVENOUS | Status: DC
  Administered 2017-02-20 – 2017-02-21 (×4): 1250 mg via INTRAVENOUS
  Filled 2017-02-19 (×7): qty 1250

## 2017-02-19 MED ORDER — TICAGRELOR 90 MG PO TABS
90.0000 mg | ORAL_TABLET | Freq: Two times a day (BID) | ORAL | Status: DC
  Administered 2017-02-19 – 2017-02-26 (×13): 90 mg via ORAL
  Filled 2017-02-19 (×15): qty 1

## 2017-02-19 MED ORDER — ZOLPIDEM TARTRATE 5 MG PO TABS
10.0000 mg | ORAL_TABLET | Freq: Every day | ORAL | Status: DC
  Administered 2017-02-19 – 2017-02-25 (×7): 10 mg via ORAL
  Filled 2017-02-19 (×7): qty 2

## 2017-02-19 MED ORDER — ONDANSETRON HCL 4 MG/2ML IJ SOLN: 4.0000 mg | Freq: Four times a day (QID) | INTRAMUSCULAR | Status: DC | PRN

## 2017-02-19 MED ORDER — SODIUM CHLORIDE 0.9 % IV SOLN
3.0000 g | Freq: Four times a day (QID) | INTRAVENOUS | Status: DC
  Administered 2017-02-19 – 2017-02-22 (×10): 3 g via INTRAVENOUS
  Filled 2017-02-19 (×14): qty 3

## 2017-02-19 MED ORDER — MORPHINE SULFATE ER 15 MG PO TBCR
15.0000 mg | EXTENDED_RELEASE_TABLET | Freq: Every day | ORAL | Status: DC
  Administered 2017-02-19 – 2017-02-25 (×7): 15 mg via ORAL
  Filled 2017-02-19 (×7): qty 1

## 2017-02-19 MED ORDER — ONDANSETRON HCL 4 MG PO TABS: 4.0000 mg | ORAL_TABLET | Freq: Four times a day (QID) | ORAL | Status: DC | PRN

## 2017-02-19 MED ORDER — MORPHINE SULFATE ER 30 MG PO TBCR
30.0000 mg | EXTENDED_RELEASE_TABLET | Freq: Every morning | ORAL | Status: DC
  Administered 2017-02-20 – 2017-02-26 (×6): 30 mg via ORAL
  Filled 2017-02-19: qty 2
  Filled 2017-02-19 (×4): qty 1
  Filled 2017-02-19: qty 2
  Filled 2017-02-19: qty 1

## 2017-02-19 MED ORDER — GABAPENTIN 300 MG PO CAPS
600.0000 mg | ORAL_CAPSULE | Freq: Every day | ORAL | Status: DC
  Administered 2017-02-19 – 2017-02-22 (×13): 600 mg via ORAL
  Filled 2017-02-19 (×13): qty 2

## 2017-02-19 MED ORDER — LEVOMILNACIPRAN HCL ER 40 MG PO CP24: 40.0000 mg | ORAL_CAPSULE | Freq: Every day | ORAL | Status: DC

## 2017-02-19 MED ORDER — AMLODIPINE BESYLATE 5 MG PO TABS
5.0000 mg | ORAL_TABLET | Freq: Every day | ORAL | Status: DC
  Administered 2017-02-19 – 2017-02-26 (×7): 5 mg via ORAL
  Filled 2017-02-19 (×7): qty 1

## 2017-02-19 MED ORDER — HEPARIN SODIUM (PORCINE) 5000 UNIT/ML IJ SOLN
5000.0000 [IU] | Freq: Three times a day (TID) | INTRAMUSCULAR | Status: DC
  Administered 2017-02-19 – 2017-02-26 (×19): 5000 [IU] via SUBCUTANEOUS
  Filled 2017-02-19 (×18): qty 1

## 2017-02-19 MED ORDER — SODIUM CHLORIDE 0.9 % IV SOLN
INTRAVENOUS | Status: DC
  Administered 2017-02-19: 20:00:00 via INTRAVENOUS

## 2017-02-19 MED ORDER — HYDROCODONE-ACETAMINOPHEN 5-325 MG PO TABS
1.0000 | ORAL_TABLET | ORAL | Status: DC | PRN
  Administered 2017-02-19 – 2017-02-25 (×5): 2 via ORAL
  Filled 2017-02-19 (×5): qty 2

## 2017-02-19 MED ORDER — SILDENAFIL CITRATE 20 MG PO TABS
20.0000 mg | ORAL_TABLET | Freq: Three times a day (TID) | ORAL | Status: DC
  Administered 2017-02-22 – 2017-02-26 (×8): 20 mg via ORAL
  Filled 2017-02-19 (×20): qty 1

## 2017-02-19 MED ORDER — ONDANSETRON HCL 4 MG PO TABS: 8.0000 mg | ORAL_TABLET | Freq: Three times a day (TID) | ORAL | Status: DC | PRN

## 2017-02-19 MED ORDER — ALPRAZOLAM 1 MG PO TABS
1.0000 mg | ORAL_TABLET | Freq: Three times a day (TID) | ORAL | Status: DC
  Administered 2017-02-20 – 2017-02-26 (×18): 1 mg via ORAL
  Filled 2017-02-19 (×18): qty 1

## 2017-02-19 MED ORDER — DOCUSATE SODIUM 100 MG PO CAPS
100.0000 mg | ORAL_CAPSULE | Freq: Two times a day (BID) | ORAL | Status: DC
  Administered 2017-02-19: 100 mg via ORAL
  Filled 2017-02-19: qty 1

## 2017-02-19 MED ORDER — TESTOSTERONE UNDECANOATE 750 MG/3ML IM SOLN: 750.0000 mg | INTRAMUSCULAR | Status: DC

## 2017-02-19 MED ORDER — ASPIRIN EC 81 MG PO TBEC
81.0000 mg | DELAYED_RELEASE_TABLET | Freq: Every day | ORAL | Status: DC
  Administered 2017-02-20 – 2017-02-26 (×6): 81 mg via ORAL
  Filled 2017-02-19 (×7): qty 1

## 2017-02-19 MED ORDER — CARVEDILOL 3.125 MG PO TABS
6.2500 mg | ORAL_TABLET | Freq: Two times a day (BID) | ORAL | Status: DC
  Administered 2017-02-20 – 2017-02-26 (×13): 6.25 mg via ORAL
  Filled 2017-02-19 (×12): qty 2

## 2017-02-19 MED ORDER — ACETAMINOPHEN 650 MG RE SUPP: 650.0000 mg | Freq: Four times a day (QID) | RECTAL | Status: DC | PRN

## 2017-02-19 MED ORDER — PANTOPRAZOLE SODIUM 40 MG PO TBEC
40.0000 mg | DELAYED_RELEASE_TABLET | Freq: Every day | ORAL | Status: DC
  Administered 2017-02-19 – 2017-02-26 (×7): 40 mg via ORAL
  Filled 2017-02-19 (×7): qty 1

## 2017-02-19 MED ORDER — DULOXETINE HCL 60 MG PO CPEP
60.0000 mg | ORAL_CAPSULE | Freq: Every day | ORAL | Status: DC
  Administered 2017-02-20 – 2017-02-26 (×6): 60 mg via ORAL
  Filled 2017-02-19 (×8): qty 1

## 2017-02-19 MED ORDER — ALBUTEROL SULFATE (2.5 MG/3ML) 0.083% IN NEBU: 2.5000 mg | INHALATION_SOLUTION | Freq: Four times a day (QID) | RESPIRATORY_TRACT | Status: DC | PRN

## 2017-02-19 MED ORDER — VANCOMYCIN HCL IN DEXTROSE 1-5 GM/200ML-% IV SOLN: 1000.0000 mg | INTRAVENOUS | Status: DC

## 2017-02-19 MED ORDER — LEVOTHYROXINE SODIUM 50 MCG PO TABS
75.0000 ug | ORAL_TABLET | Freq: Every day | ORAL | Status: DC
  Administered 2017-02-20 – 2017-02-26 (×6): 75 ug via ORAL
  Filled 2017-02-19 (×6): qty 1

## 2017-02-19 NOTE — Consult Note (Signed)
Reason for Consult: Postoperative infection right foot Referring Physician: Matty Vanroekel is an 56 y.o. male.  HPI: This is a 56 year old male who recently underwent hallux valgus correction about a week and a half ago and subsequently had to have revision surgery for fracture and displacement at the surgical site. Noticed significant bleeding on the bandaging and was evaluated outpatient today and noted to have significant cellulitis and evidence for postoperative infection and decision was made for hospitalization for IV antibiotics and observation.  Past Medical History:  Diagnosis Date  . Anginal pain ([place])   . Anxiety   . Arthritis   . Chronic back pain   . Coronary artery disease   . Depression   . GERD (gastroesophageal reflux disease)   . Hypertension   . Hypothyroidism   . Insomnia   . Low testosterone   . Myocardial infarction ([place])   . Peptic ulcer   . Shortness of breath   . Sleep apnea    has cpap-has not used since lost 140lb  . Spinal cord stimulator status    has a scs  . Wears glasses     Past Surgical History:  Procedure Laterality Date  . BACK SURGERY    . CARDIAC CATHETERIZATION N/A 06/27/2015   Procedure: Left Heart Cath and Coronary Angiography;  Surgeon: Dionisio David, MD;  Location: [place] [facility];  Service: Cardiovascular;  Laterality: N/A;  . CARDIAC CATHETERIZATION N/A 06/27/2015   Procedure: Coronary Stent Intervention;  Surgeon: Yolonda Kida, MD;  Location: [place] [facility];  Service: Cardiovascular;  Laterality: N/A;  . CLOSED REDUCTION NASAL FRACTURE  12/22/2011   Procedure: CLOSED REDUCTION NASAL FRACTURE;  Surgeon: Ascencion Dike, MD;  Location: [place];  Service: ENT;  Laterality: N/A;  closed reduction of nasal fracture  . FACIAL FRACTURE SURGERY     face-upper jaw with dental implants  . GASTRIC BYPASS  2011   has lost 140lb  . LEFT HEART CATH AND CORONARY ANGIOGRAPHY N/A 06/25/2016   Procedure: Left Heart  Cath and Coronary Angiography;  Surgeon: Corey Skains, MD;  Location: [place] [facility];  Service: Cardiovascular;  Laterality: N/A;  . METATARSAL OSTEOTOMY Right 02/10/2017   Procedure: METATARSAL OSTEOTOMY-GREAT TOE AND 1ST METATARSAL;  Surgeon: Samara Deist, DPM;  Location: [place];  Service: Podiatry;  Laterality: Right;  . ORIF TOE FRACTURE Right 02/17/2017   Procedure: Open reduction with internal fixation displaced osteotomy and fracture first metatarsal;  Surgeon: Samara Deist, DPM;  Location: [facility];  Service: Podiatry;  Laterality: Right;  IVA / POPLITEAL  . REPAIR TENDONS FOOT  2002   rt foot  . SPINAL CORD STIMULATOR IMPLANT  6/13    Family History  Problem Relation Age of Onset  . Diabetes Father     Social History:  reports that he has never smoked. He has never used smokeless tobacco. He reports that he does not drink alcohol or use drugs.  Allergies:  Allergies  Allergen Reactions  . Lisinopril Cough    Medications:  Scheduled: . acidophilus  1 capsule Oral Daily  . ALPRAZolam  1 mg Oral TID  . amLODipine  5 mg Oral Daily  . aspirin EC  81 mg Oral Daily  . [START ON 02/20/2017] atorvastatin  40 mg Oral q1800  . busPIRone  30 mg Oral BID  . [START ON 02/20/2017] carvedilol  6.25 mg Oral BID WC  . docusate sodium  100 mg  Oral BID  . DULoxetine  60 mg Oral Daily  . gabapentin  600 mg Oral 6 X Daily  . heparin  5,000 Units Subcutaneous Q8H  . Levomilnacipran HCl ER  40 mg Oral Daily  . [START ON 02/20/2017] levothyroxine  75 mcg Oral QAC breakfast  . morphine  15 mg Oral QHS  . [START ON 02/20/2017] morphine  30 mg Oral q morning - 10a  . multivitamin with minerals  1 tablet Oral Daily  . oxyCODONE  10 mg Oral 6 X Daily  . pantoprazole  40 mg Oral Daily  . sildenafil  20 mg Oral TID  . Testosterone Undecanoate  750 mg Intramuscular Weekly  . ticagrelor  90 mg Oral BID  . zolpidem  10 mg Oral QHS    No results found for this  or any previous visit (from the past 48 hour(s)).  No results found.  Review of Systems  Constitutional: Negative for chills and fever.  HENT: Negative.   Eyes: Negative.   Respiratory: Negative.   Cardiovascular: Negative.   Gastrointestinal: Negative for nausea and vomiting.  Genitourinary: Negative.   Musculoskeletal:       Pain in his right foot which is his surgical foot  Skin:       Recent surgery with bleeding and some redness and swelling in the right foot  Neurological: Negative.   Endo/Heme/Allergies: Negative.   Psychiatric/Behavioral: Negative.    Blood pressure (!) 141/75, pulse 98, temperature 99.3 F (37.4 C), temperature source Oral, resp. rate 19, SpO2 95 %. Physical Exam  Cardiovascular:  Pedal pulses are intact and palpable.  Musculoskeletal:  Limited exam due to recent surgery on the right foot  Neurological:  Epicritic sensations grossly intact and symmetric.  Skin:  Significant erythema and edema in the right foot with some mild dehiscence at the incision site from the surgery. Some active bleeding with scant signs of purulence    Assessment/Plan: Assessment: Postoperative infection status post hallux valgus correction.  Plan: Admitted for IV antibiotics and close observation. Discussed with the patient that depending on how he responds to the antibiotics he may or may not need an irrigation of the surgical site. We will evaluate daily and assess for the need for any further surgical intervention.  Charles [name] 02/19/2017, 7:57 PM

## 2017-02-19 NOTE — Progress Notes (Signed)
ANTIBIOTIC CONSULT NOTE - INITIAL  Pharmacy Consult for Vancomycin  Indication: foot infection  Allergies  Allergen Reactions  . Lisinopril Cough    Patient Measurements:   Adjusted Body Weight: 93 kg   Vital Signs: Temp: 99.3 F (37.4 C) (11/02 1919) Temp Source: Oral (11/02 1919) BP: 141/75 (11/02 1919) Pulse Rate: 98 (11/02 1919) Intake/Output from previous day: No intake/output data recorded. Intake/Output from this shift: No intake/output data recorded.  Labs: No results for input(s): WBC, HGB, PLT, LABCREA, CREATININE in the last 72 hours. CrCl cannot be calculated (Patient's most recent lab result is older than the maximum 21 days allowed.). No results for input(s): VANCOTROUGH, VANCOPEAK, VANCORANDOM, GENTTROUGH, GENTPEAK, GENTRANDOM, TOBRATROUGH, TOBRAPEAK, TOBRARND, AMIKACINPEAK, AMIKACINTROU, AMIKACIN in the last 72 hours.   Microbiology: No results found for this or any previous visit (from the past 720 hour(s)).  Medical History: Past Medical History:  Diagnosis Date  . Anginal pain ([place])   . Anxiety   . Arthritis   . Chronic back pain   . Coronary artery disease   . Depression   . GERD (gastroesophageal reflux disease)   . Hypertension   . Hypothyroidism   . Insomnia   . Low testosterone   . Myocardial infarction ([place])   . Peptic ulcer   . Shortness of breath   . Sleep apnea    has cpap-has not used since lost 140lb  . Spinal cord stimulator status    has a scs  . Wears glasses     Medications:  Scheduled:  . acidophilus  1 capsule Oral Daily  . ALPRAZolam  1 mg Oral TID  . amLODipine  5 mg Oral Daily  . aspirin EC  81 mg Oral Daily  . [START ON 02/20/2017] atorvastatin  40 mg Oral q1800  . busPIRone  30 mg Oral BID  . [START ON 02/20/2017] carvedilol  6.25 mg Oral BID WC  . docusate sodium  100 mg Oral BID  . DULoxetine  60 mg Oral Daily  . gabapentin  600 mg Oral 6 X Daily  . heparin  5,000 Units Subcutaneous Q8H  . Levomilnacipran  HCl ER  40 mg Oral Daily  . [START ON 02/20/2017] levothyroxine  75 mcg Oral QAC breakfast  . morphine  15 mg Oral QHS  . [START ON 02/20/2017] morphine  30 mg Oral q morning - 10a  . multivitamin with minerals  1 tablet Oral Daily  . oxyCODONE  10 mg Oral 6 X Daily  . pantoprazole  40 mg Oral Daily  . sildenafil  20 mg Oral TID  . Testosterone Undecanoate  750 mg Intramuscular Weekly  . ticagrelor  90 mg Oral BID  . zolpidem  10 mg Oral QHS   Assessment: CrCl = 110 ml/min Ke = 0.1 hr-1 T 1/2 = 7.2 hrs Vd = 65.1 L   Goal of Therapy:  Vancomycin trough level 15-20 mcg/ml  Plan:  Expected duration 7 days with resolution of temperature and/or normalization of WBC   Vancomycin 1250 mg IV X 1 ordered to be given on 11/2 @ 20:00. Vancomycin 1250 mg IV Q8H ordered to start on 11/3 @ 0200, ~ 6 hrs after 1st dose (stacked dosing).  This pt will reach [facility] by 11/4 @ 0800 . Will draw 1st trough on 11/4 @ 0930, which will be at Css.   Charles [name] D 02/19/2017,7:25 PM

## 2017-02-19 NOTE — H&P (Addendum)
[facility] at [place] NAME: Charles [name]    MR#:  [id number]  DATE OF BIRTH:  1961/03/14  DATE OF ADMISSION:  02/19/2017  PRIMARY CARE PHYSICIAN: Lavera Guise, MD   REQUESTING/REFERRING PHYSICIAN: Dr Sharlotte Alamo  CHIEF COMPLAINT:  No chief complaint on file. Foot pain  HISTORY OF PRESENT ILLNESS:  Charles [name]  is a 56 y.o. male with a known history of attention, hypothyroidism, GERD, depression,  coronary artery disease has been direct admitted per request of Dr. Cleda Mccreedy for right foot surgery.  Patient has had surgery on the same foot but continues to get infected and Dr. Caryl Comes requested him to be admitted for revision surgery likely on Monday. PAST MEDICAL HISTORY:   Past Medical History:  Diagnosis Date  . Anginal pain ([place])   . Anxiety   . Arthritis   . Chronic back pain   . Coronary artery disease   . Depression   . GERD (gastroesophageal reflux disease)   . Hypertension   . Hypothyroidism   . Insomnia   . Low testosterone   . Myocardial infarction ([place])   . Peptic ulcer   . Shortness of breath   . Sleep apnea    has cpap-has not used since lost 140lb  . Spinal cord stimulator status    has a scs  . Wears glasses     PAST SURGICAL HISTORY:   Past Surgical History:  Procedure Laterality Date  . BACK SURGERY    . CARDIAC CATHETERIZATION N/A 06/27/2015   Procedure: Left Heart Cath and Coronary Angiography;  Surgeon: Dionisio David, MD;  Location: [facility];  Service: Cardiovascular;  Laterality: N/A;  . CARDIAC CATHETERIZATION N/A 06/27/2015   Procedure: Coronary Stent Intervention;  Surgeon: Yolonda Kida, MD;  Location: [facility];  Service: Cardiovascular;  Laterality: N/A;  . CLOSED REDUCTION NASAL FRACTURE  12/22/2011   Procedure: CLOSED REDUCTION NASAL FRACTURE;  Surgeon: Ascencion Dike, MD;  Location: [facility];  Service: ENT;  Laterality: N/A;  closed reduction of nasal fracture   . FACIAL FRACTURE SURGERY     face-upper jaw with dental implants  . GASTRIC BYPASS  2011   has lost 140lb  . LEFT HEART CATH AND CORONARY ANGIOGRAPHY N/A 06/25/2016   Procedure: Left Heart Cath and Coronary Angiography;  Surgeon: Corey Skains, MD;  Location: [facility];  Service: Cardiovascular;  Laterality: N/A;  . METATARSAL OSTEOTOMY Right 02/10/2017   Procedure: METATARSAL OSTEOTOMY-GREAT TOE AND 1ST METATARSAL;  Surgeon: Samara Deist, DPM;  Location: [facility];  Service: Podiatry;  Laterality: Right;  . ORIF TOE FRACTURE Right 02/17/2017   Procedure: Open reduction with internal fixation displaced osteotomy and fracture first metatarsal;  Surgeon: Samara Deist, DPM;  Location: [facility];  Service: Podiatry;  Laterality: Right;  IVA / POPLITEAL  . REPAIR TENDONS FOOT  2002   rt foot  . SPINAL CORD STIMULATOR IMPLANT  6/13    SOCIAL HISTORY:   Social History  Substance Use Topics  . Smoking status: Never Smoker  . Smokeless tobacco: Never Used  . Alcohol use No     Comment: not in 4 yr    FAMILY HISTORY:   Family History  Problem Relation Age of Onset  . Diabetes Father     DRUG ALLERGIES:   Allergies  Allergen Reactions  . Lisinopril Cough    REVIEW OF SYSTEMS:   Review  of Systems  Constitutional: Negative for chills, fever and weight loss.  HENT: Negative for nosebleeds and sore throat.   Eyes: Negative for blurred vision.  Respiratory: Negative for cough, shortness of breath and wheezing.   Cardiovascular: Negative for chest pain, orthopnea, leg swelling and PND.  Gastrointestinal: Negative for abdominal pain, constipation, diarrhea, heartburn, nausea and vomiting.  Genitourinary: Negative for dysuria and urgency.  Musculoskeletal: Positive for joint pain. Negative for back pain.  Skin: Negative for rash.  Neurological: Negative for dizziness, speech change, focal weakness and headaches.  Endo/Heme/Allergies: Does not  bruise/bleed easily.  Psychiatric/Behavioral: Negative for depression.   MEDICATIONS AT HOME:   Prior to Admission medications   Medication Sig Start Date End Date Taking? Authorizing Provider  acidophilus (RISAQUAD) CAPS capsule Take 1 capsule by mouth daily.    [provider]  albuterol (PROVENTIL HFA;VENTOLIN HFA) 108 (90 Base) MCG/ACT inhaler Inhale 2 puffs into the lungs every 6 (six) hours as needed for wheezing or shortness of breath. 07/11/15   Hillary Bow, MD  ALPRAZolam Duanne Moron) 1 MG tablet Take 1 mg by mouth 3 (three) times daily.     [provider]  amLODipine (NORVASC) 5 MG tablet Take 5 mg by mouth daily.    [provider]  aspirin EC 81 MG tablet Take 1 tablet (81 mg total) by mouth daily. 11/18/16   Hillary Bow, MD  atorvastatin (LIPITOR) 40 MG tablet Take 1 tablet (40 mg total) by mouth daily at 6 PM. Patient taking differently: Take 20 mg by mouth daily at 6 PM.  06/28/15   Vaughan Basta, MD  busPIRone (BUSPAR) 15 MG tablet Take 30 mg by mouth 2 (two) times daily.     [provider]  carvedilol (COREG) 6.25 MG tablet Take 1 tablet (6.25 mg total) by mouth 2 (two) times daily with a meal. 11/18/16   Sudini, Srikar, MD  Cyanocobalamin (B-12 PO) Take 1 tablet by mouth daily.    [provider]  DULoxetine (CYMBALTA) 60 MG capsule Take 60 mg by mouth daily.    [provider]  gabapentin (NEURONTIN) 300 MG capsule Take 600 mg by mouth 6 (six) times daily.    [provider]  Levomilnacipran HCl ER 40 MG CP24 Take 40 mg by mouth daily.    [provider]  levothyroxine (SYNTHROID, LEVOTHROID) 75 MCG tablet Take 75 mcg by mouth daily before breakfast.     [provider]  Lido-Capsaicin-Men-Methyl Sal (MEDI-PATCH-LIDOCAINE EX) Apply topically.    [provider]  morphine (MS CONTIN) 15 MG 12 hr tablet Take 15-30 mg by mouth every 12 (twelve) hours. Pt takes two tablets in the  morning and one at night.    [provider]  Multiple Vitamin (MULTIVITAMIN WITH MINERALS) TABS tablet Take 1 tablet by mouth daily.    [provider]  naloxegol oxalate (MOVANTIK) 25 MG TABS tablet Take 25 mg by mouth daily.    [provider]  omeprazole (PRILOSEC) 20 MG capsule Take 20 mg by mouth daily.     [provider]  ondansetron (ZOFRAN) 8 MG tablet Take 8 mg by mouth every 8 (eight) hours as needed for nausea or vomiting.    [provider]  Oxycodone HCl 10 MG TABS Take 10 mg by mouth 6 (six) times daily.    [provider]  sildenafil (REVATIO) 20 MG tablet Take 20 mg by mouth 3 (three) times daily.    [provider]  Testosterone  Undecanoate 750 MG/3ML SOLN Inject 750 mg into the muscle once a week.    [provider]  ticagrelor (BRILINTA) 90 MG TABS tablet Take by mouth 2 (two) times daily.    [provider]  zolpidem (AMBIEN) 10 MG tablet Take 10 mg by mouth at bedtime.    [provider]      VITAL SIGNS:  Blood pressure (!) 141/81, pulse 88, temperature 99 F (37.2 C), temperature source Oral, resp. rate 18, SpO2 96 %.  PHYSICAL EXAMINATION:  Physical Exam  Constitutional: He is oriented to person, place, and time and well-developed, well-nourished, and in no distress.  HENT:  Head: Normocephalic and atraumatic.  Eyes: Pupils are equal, round, and reactive to light. Conjunctivae and EOM are normal.  Neck: Normal range of motion. Neck supple. No tracheal deviation present. No thyromegaly present.  Cardiovascular: Normal rate, regular rhythm and normal heart sounds.   Pulmonary/Chest: Effort normal and breath sounds normal. No respiratory distress. He has no wheezes. He exhibits no tenderness.  Abdominal: Soft. Bowel sounds are normal. He exhibits no distension. There is no tenderness.  Musculoskeletal: Normal range of motion.  Right foot has an Ace wrap bandage wrapped.  Please  note I did not open the dressing  Neurological: He is alert and oriented to person, place, and time. No cranial nerve deficit.  Skin: Skin is warm and dry. No rash noted.  Psychiatric: Mood and affect normal.   LABORATORY PANEL:   CBC No results for input(s): WBC, HGB, HCT, PLT in the last 168 hours. ------------------------------------------------------------------------------------------------------------------  Chemistries  No results for input(s): NA, K, CL, CO2, GLUCOSE, BUN, CREATININE, CALCIUM, MG, AST, ALT, ALKPHOS, BILITOT in the last 168 hours.  Invalid input(s): GFRCGP ------------------------------------------------------------------------------------------------------------------  Cardiac Enzymes No results for input(s): TROPONINI in the last 168 hours. ------------------------------------------------------------------------------------------------------------------  RADIOLOGY:  No results found.    IMPRESSION AND PLAN:  56 year old male with history of known history of attention, hypothyroidism, GERD, depression,  coronary artery disease has been direct admitted per request of Dr. Cleda Mccreedy for right foot surgery.  1.  Right foot infection/Postoperative infection status post hallux valgus correction -We will start IV vancomycin and Unasyn -Podiatry consultation -Order routine labs  2. Chronic pain syndrome: Continue pain medications  3. CAD: Continue Lipitor, brillanta, and atorvastatin  4. Hypothyroid: Continue Synthroid  5. Depression: Continue Cymbalta and BuSpar  6. Essential hypertension: Continue Norvasc    All the records are reviewed and case discussed with ED provider. Management plans discussed with the patient, Dr Cleda Mccreedy and they are in agreement.  CODE STATUS: Full Code  TOTAL TIME TAKING CARE OF THIS PATIENT: 35 minutes.    Max Sane M.D on 02/19/2017 at 7:00 PM  Between 7am to 6pm - Pager - [phone]  After 6pm go to  [email or web address] - password [email or web address] [facility]  [facility]  Office  [phone]  CC: Primary care physician; Lavera Guise, MD   Note: This dictation was prepared with Dragon dictation along with smaller phrase technology. Any transcriptional errors that result from this process are unintentional.

## 2017-02-20 ENCOUNTER — Inpatient Hospital Stay: Payer: BLUE CROSS/BLUE SHIELD

## 2017-02-20 LAB — BASIC METABOLIC PANEL
ANION GAP: 4 — AB (ref 5–15)
BUN: 15 mg/dL (ref 6–20)
CHLORIDE: 105 mmol/L (ref 101–111)
CO2: 30 mmol/L (ref 22–32)
Calcium: 8.4 mg/dL — ABNORMAL LOW (ref 8.9–10.3)
Creatinine, Ser: 1.05 mg/dL (ref 0.61–1.24)
GFR calc Af Amer: >60 mL/min (ref >60)
Glucose, Bld: 197 mg/dL — ABNORMAL HIGH (ref 65–99)
POTASSIUM: 4.2 mmol/L (ref 3.5–5.1)
Sodium: 139 mmol/L (ref 135–145)

## 2017-02-20 LAB — CBC
HEMATOCRIT: 43.2 % (ref 40.0–52.0)
HEMOGLOBIN: 14.6 g/dL (ref 13.0–18.0)
MCH: 30.8 pg (ref 26.0–34.0)
MCHC: 33.7 g/dL (ref 32.0–36.0)
MCV: 91.3 fL (ref 80.0–100.0)
Platelets: 160 10*3/uL (ref 150–440)
RBC: 4.73 MIL/uL (ref 4.40–5.90)
RDW: 15.9 % — AB (ref 11.5–14.5)
WBC: 8.1 10*3/uL (ref 3.8–10.6)

## 2017-02-20 MED ORDER — POLYETHYLENE GLYCOL 3350 17 G PO PACK
17.0000 g | PACK | Freq: Every day | ORAL | Status: DC
  Administered 2017-02-20: 17 g via ORAL
  Filled 2017-02-20: qty 1

## 2017-02-20 MED ORDER — HYDROMORPHONE HCL 1 MG/ML IJ SOLN
2.0000 mg | Freq: Four times a day (QID) | INTRAMUSCULAR | Status: DC | PRN
  Administered 2017-02-20 – 2017-02-21 (×3): 2 mg via INTRAVENOUS
  Filled 2017-02-20 (×3): qty 2

## 2017-02-20 MED ORDER — SENNOSIDES-DOCUSATE SODIUM 8.6-50 MG PO TABS
1.0000 | ORAL_TABLET | Freq: Two times a day (BID) | ORAL | Status: DC
  Administered 2017-02-20 – 2017-02-26 (×12): 1 via ORAL
  Filled 2017-02-20 (×12): qty 1
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
## 2017-02-20 NOTE — Progress Notes (Signed)
   Subjective/Chief Complaint: Patient seen. States the right foot feels maybe a little bit better today. Not throbbing as bad.   Objective: Vital signs in last 24 hours: Temp:  [97.8 F (36.6 C)-99.3 F (37.4 C)] 97.8 F (36.6 C) (11/03 0718) Pulse Rate:  [85-98] 85 (11/03 0718) Resp:  [16-19] 16 (11/03 0718) BP: (137-141)/(75-81) 137/79 (11/03 0718) SpO2:  [95 %-96 %] 96 % (11/03 0718) Weight:  [141.1 kg (311 lb)] 141.1 kg (311 lb) (11/03 0500)    Intake/Output from previous day: 11/02 0701 - 11/03 0700 In: 1768.8 [P.O.:480; I.V.:688.8; IV Piggyback:600] Out: 6294 [Urine:1580] Intake/Output this shift: Total I/O In: -  Out: 250 [Urine:250]  Edema and erythema in the right foot has improved some but still significant. Some increased purulent drainage is noted on the bandaging today with some increased maceration and dehiscence of the incision area over the great toe joint. Still some scant expressible purulence in the bleeding.  Lab Results:   Recent Labs  02/19/17 1952 02/20/17 0339  WBC 11.2* 8.1  HGB 15.4 14.6  HCT 46.2 43.2  PLT 177 160   BMET  Recent Labs  02/19/17 1952 02/20/17 0339  NA 133* 139  K 3.6 4.2  CL 99* 105  CO2 25 30  GLUCOSE 146* 197*  BUN 13 15  CREATININE 1.02 1.05  CALCIUM 8.9 8.4*   PT/INR No results for input(s): LABPROT, INR in the last 72 hours. ABG No results for input(s): PHART, HCO3 in the last 72 hours.  Invalid input(s): PCO2, PO2  Studies/Results: No results found.  Anti-infectives: Anti-infectives    Start     Dose/Rate Route Frequency Ordered Stop   02/20/17 0200  vancomycin (VANCOCIN) 1,250 mg in sodium chloride 0.9 % 250 mL IVPB     1,250 mg 166.7 mL/hr over 90 Minutes Intravenous Every 8 hours 02/19/17 1922     02/19/17 2000  vancomycin (VANCOCIN) 1,250 mg in sodium chloride 0.9 % 250 mL IVPB     1,250 mg 166.7 mL/hr over 90 Minutes Intravenous  Once 02/19/17 1919 02/19/17 2233   02/19/17 1900   Ampicillin-Sulbactam (UNASYN) 3 g in sodium chloride 0.9 % 100 mL IVPB     3 g 200 mL/hr over 30 Minutes Intravenous Every 6 hours 02/19/17 1841     02/19/17 1845  vancomycin (VANCOCIN) IVPB 1000 mg/200 mL premix  Status:  Discontinued     1,000 mg 200 mL/hr over 60 Minutes Intravenous Every 24 hours 02/19/17 1841 02/19/17 1919      Assessment/Plan: s/p * No surgery found * Assessment: Postoperative infection status post hallux valgus correction   Plan: Betadine and a sterile bandage reapplied to the right foot. Discussed with the patient that at this point I think we should proceed with opening the wound up and irrigating to remove any obvious infection. At this point I think we will try to plan for this tomorrow morning. For now we will continue on the IV antibiotics. We will obtain consent for irrigation and debridement right foot. Nothing by mouth after midnight.  LOS: 1 day    Durward Fortes 02/20/2017

## 2017-02-20 NOTE — H&P (View-Only) (Signed)
   Subjective/Chief Complaint: Patient seen. States the right foot feels maybe a little bit better today. Not throbbing as bad.   Objective: Vital signs in last 24 hours: Temp:  [97.8 F (36.6 C)-99.3 F (37.4 C)] 97.8 F (36.6 C) (11/03 0718) Pulse Rate:  [85-98] 85 (11/03 0718) Resp:  [16-19] 16 (11/03 0718) BP: (137-141)/(75-81) 137/79 (11/03 0718) SpO2:  [95 %-96 %] 96 % (11/03 0718) Weight:  [141.1 kg (311 lb)] 141.1 kg (311 lb) (11/03 0500)    Intake/Output from previous day: 11/02 0701 - 11/03 0700 In: 1768.8 [P.O.:480; I.V.:688.8; IV Piggyback:600] Out: 1324 [Urine:1580] Intake/Output this shift: Total I/O In: -  Out: 250 [Urine:250]  Edema and erythema in the right foot has improved some but still significant. Some increased purulent drainage is noted on the bandaging today with some increased maceration and dehiscence of the incision area over the great toe joint. Still some scant expressible purulence in the bleeding.  Lab Results:   Recent Labs  02/19/17 1952 02/20/17 0339  WBC 11.2* 8.1  HGB 15.4 14.6  HCT 46.2 43.2  PLT 177 160   BMET  Recent Labs  02/19/17 1952 02/20/17 0339  NA 133* 139  K 3.6 4.2  CL 99* 105  CO2 25 30  GLUCOSE 146* 197*  BUN 13 15  CREATININE 1.02 1.05  CALCIUM 8.9 8.4*   PT/INR No results for input(s): LABPROT, INR in the last 72 hours. ABG No results for input(s): PHART, HCO3 in the last 72 hours.  Invalid input(s): PCO2, PO2  Studies/Results: No results found.  Anti-infectives: Anti-infectives    Start     Dose/Rate Route Frequency Ordered Stop   02/20/17 0200  vancomycin (VANCOCIN) 1,250 mg in sodium chloride 0.9 % 250 mL IVPB     1,250 mg 166.7 mL/hr over 90 Minutes Intravenous Every 8 hours 02/19/17 1922     02/19/17 2000  vancomycin (VANCOCIN) 1,250 mg in sodium chloride 0.9 % 250 mL IVPB     1,250 mg 166.7 mL/hr over 90 Minutes Intravenous  Once 02/19/17 1919 02/19/17 2233   02/19/17 1900   Ampicillin-Sulbactam (UNASYN) 3 g in sodium chloride 0.9 % 100 mL IVPB     3 g 200 mL/hr over 30 Minutes Intravenous Every 6 hours 02/19/17 1841     02/19/17 1845  vancomycin (VANCOCIN) IVPB 1000 mg/200 mL premix  Status:  Discontinued     1,000 mg 200 mL/hr over 60 Minutes Intravenous Every 24 hours 02/19/17 1841 02/19/17 1919      Assessment/Plan: s/p * No surgery found * Assessment: Postoperative infection status post hallux valgus correction   Plan: Betadine and a sterile bandage reapplied to the right foot. Discussed with the patient that at this point I think we should proceed with opening the wound up and irrigating to remove any obvious infection. At this point I think we will try to plan for this tomorrow morning. For now we will continue on the IV antibiotics. We will obtain consent for irrigation and debridement right foot. Nothing by mouth after midnight.  LOS: 1 day    Durward Fortes 02/20/2017

## 2017-02-21 ENCOUNTER — Inpatient Hospital Stay: Payer: BLUE CROSS/BLUE SHIELD | Admitting: Anesthesiology

## 2017-02-21 ENCOUNTER — Encounter: Payer: Self-pay | Admitting: Anesthesiology

## 2017-02-21 ENCOUNTER — Encounter
Admission: AD | Disposition: A | Discharge status: Home Health | Payer: Self-pay | Source: Ambulatory Visit | Attending: Internal Medicine

## 2017-02-21 HISTORY — PX: IRRIGATION AND DEBRIDEMENT FOOT: SHX6602

## 2017-02-21 LAB — BASIC METABOLIC PANEL
ANION GAP: 6 (ref 5–15)
BUN: 13 mg/dL (ref 6–20)
CO2: 28 mmol/L (ref 22–32)
Calcium: 8.6 mg/dL — ABNORMAL LOW (ref 8.9–10.3)
Chloride: 104 mmol/L (ref 101–111)
Creatinine, Ser: 0.94 mg/dL (ref 0.61–1.24)
GFR calc Af Amer: >60 mL/min (ref >60)
GFR calc non Af Amer: >60 mL/min (ref >60)
GLUCOSE: 133 mg/dL — AB (ref 65–99)
POTASSIUM: 3.9 mmol/L (ref 3.5–5.1)
Sodium: 138 mmol/L (ref 135–145)

## 2017-02-21 LAB — GLUCOSE, CAPILLARY: Glucose-Capillary: 335 mg/dL — ABNORMAL HIGH (ref 65–99)

## 2017-02-21 LAB — VANCOMYCIN, TROUGH: Vancomycin Tr: 5 ug/mL — ABNORMAL LOW (ref 15–20)

## 2017-02-21 SURGERY — IRRIGATION AND DEBRIDEMENT FOOT
Anesthesia: General | Laterality: Right

## 2017-02-21 MED ORDER — ONDANSETRON HCL 4 MG/2ML IJ SOLN
INTRAMUSCULAR | Status: DC | PRN
  Administered 2017-02-21: 4 mg via INTRAVENOUS

## 2017-02-21 MED ORDER — BUPIVACAINE HCL (PF) 0.5 % IJ SOLN
INTRAMUSCULAR | Status: AC
  Filled 2017-02-21: qty 30

## 2017-02-21 MED ORDER — FENTANYL CITRATE (PF) 250 MCG/5ML IJ SOLN
INTRAMUSCULAR | Status: AC
Start: 2017-02-21 — End: ?
  Filled 2017-02-21: qty 5

## 2017-02-21 MED ORDER — PROPOFOL 10 MG/ML IV BOLUS
INTRAVENOUS | Status: DC | PRN
  Administered 2017-02-21: 180 mg via INTRAVENOUS
  Administered 2017-02-21: 70 mg via INTRAVENOUS

## 2017-02-21 MED ORDER — SUCCINYLCHOLINE CHLORIDE 20 MG/ML IJ SOLN
INTRAMUSCULAR | Status: AC
  Filled 2017-02-21: qty 1

## 2017-02-21 MED ORDER — HYDROMORPHONE HCL 1 MG/ML IJ SOLN
2.0000 mg | INTRAMUSCULAR | Status: DC | PRN
  Administered 2017-02-21 – 2017-02-25 (×4): 2 mg via INTRAVENOUS
  Filled 2017-02-21 (×4): qty 2

## 2017-02-21 MED ORDER — FENTANYL CITRATE (PF) 100 MCG/2ML IJ SOLN: 25.0000 ug | INTRAMUSCULAR | Status: DC | PRN

## 2017-02-21 MED ORDER — PROPOFOL 500 MG/50ML IV EMUL
INTRAVENOUS | Status: AC
  Filled 2017-02-21: qty 50

## 2017-02-21 MED ORDER — FENTANYL CITRATE (PF) 100 MCG/2ML IJ SOLN
INTRAMUSCULAR | Status: DC | PRN
  Administered 2017-02-21 (×3): 50 ug via INTRAVENOUS
  Administered 2017-02-21: 100 ug via INTRAVENOUS

## 2017-02-21 MED ORDER — LIDOCAINE HCL (CARDIAC) 20 MG/ML IV SOLN
INTRAVENOUS | Status: DC | PRN
  Administered 2017-02-21: 100 mg via INTRAVENOUS

## 2017-02-21 MED ORDER — MIDAZOLAM HCL 2 MG/2ML IJ SOLN
INTRAMUSCULAR | Status: DC | PRN
  Administered 2017-02-21: 3 mg via INTRAVENOUS
  Administered 2017-02-21: 2 mg via INTRAVENOUS

## 2017-02-21 MED ORDER — DEXAMETHASONE SODIUM PHOSPHATE 10 MG/ML IJ SOLN
INTRAMUSCULAR | Status: DC | PRN
  Administered 2017-02-21: 10 mg via INTRAVENOUS

## 2017-02-21 MED ORDER — ONDANSETRON HCL 4 MG/2ML IJ SOLN
INTRAMUSCULAR | Status: AC
  Filled 2017-02-21: qty 2

## 2017-02-21 MED ORDER — LIDOCAINE HCL (PF) 2 % IJ SOLN
INTRAMUSCULAR | Status: AC
  Filled 2017-02-21: qty 10

## 2017-02-21 MED ORDER — VANCOMYCIN HCL 1000 MG IV SOLR
INTRAVENOUS | Status: AC
  Filled 2017-02-21: qty 1000

## 2017-02-21 MED ORDER — BUPIVACAINE HCL (PF) 0.5 % IJ SOLN
INTRAMUSCULAR | Status: DC | PRN
  Administered 2017-02-21: 10 mL

## 2017-02-21 MED ORDER — LACTATED RINGERS IV SOLN
INTRAVENOUS | Status: DC | PRN
  Administered 2017-02-21: 08:00:00 via INTRAVENOUS

## 2017-02-21 MED ORDER — ONDANSETRON HCL 4 MG/2ML IJ SOLN: 4.0000 mg | Freq: Once | INTRAMUSCULAR | Status: DC | PRN

## 2017-02-21 MED ORDER — NEOMYCIN-POLYMYXIN B GU 40-200000 IR SOLN
Status: AC
  Filled 2017-02-21: qty 2

## 2017-02-21 MED ORDER — LIDOCAINE HCL (PF) 1 % IJ SOLN
INTRAMUSCULAR | Status: AC
Start: 2017-02-21 — End: ?
  Filled 2017-02-21: qty 30

## 2017-02-21 MED ORDER — SODIUM CHLORIDE 0.9 % IJ SOLN
INTRAMUSCULAR | Status: AC
  Filled 2017-02-21: qty 10

## 2017-02-21 MED ORDER — SUCCINYLCHOLINE CHLORIDE 20 MG/ML IJ SOLN
INTRAMUSCULAR | Status: DC | PRN
  Administered 2017-02-21: 140 mg via INTRAVENOUS

## 2017-02-21 MED ORDER — VANCOMYCIN HCL 10 G IV SOLR
1500.0000 mg | Freq: Three times a day (TID) | INTRAVENOUS | Status: DC
  Administered 2017-02-21 – 2017-02-22 (×4): 1500 mg via INTRAVENOUS
  Filled 2017-02-21 (×5): qty 1500

## 2017-02-21 MED ORDER — DEXAMETHASONE SODIUM PHOSPHATE 10 MG/ML IJ SOLN
INTRAMUSCULAR | Status: AC
  Filled 2017-02-21: qty 1

## 2017-02-21 MED ORDER — MIDAZOLAM HCL 5 MG/5ML IJ SOLN
INTRAMUSCULAR | Status: AC
Start: 2017-02-21 — End: ?
  Filled 2017-02-21: qty 5

## 2017-02-21 SURGICAL SUPPLY — 52 items
BANDAGE ELASTIC 4 LF NS (GAUZE/BANDAGES/DRESSINGS) ×2 IMPLANT
BLADE OSCILLATING/SAGITTAL (BLADE)
BLADE SURG 15 STRL LF DISP TIS (BLADE) ×1 IMPLANT
BLADE SURG 15 STRL SS (BLADE) ×1
BLADE SW THK.38XMED LNG THN (BLADE) IMPLANT
BNDG COHESIVE 4X5 WHT NS (GAUZE/BANDAGES/DRESSINGS) ×2 IMPLANT
BNDG ESMARK 4X12 TAN STRL LF (GAUZE/BANDAGES/DRESSINGS) ×2 IMPLANT
BNDG GAUZE 4.5X4.1 6PLY STRL (MISCELLANEOUS) ×2 IMPLANT
CANISTER SUCT 1200ML W/VALVE (MISCELLANEOUS) ×2 IMPLANT
CUFF TOURN 18 STER (MISCELLANEOUS) ×2 IMPLANT
CUFF TOURN DUAL PL 12 NO SLV (MISCELLANEOUS) IMPLANT
DRAPE FLUOR MINI C-ARM 54X84 (DRAPES) IMPLANT
DURAPREP 26ML APPLICATOR (WOUND CARE) ×2 IMPLANT
ELECT REM PT RETURN 9FT ADLT (ELECTROSURGICAL) ×2
ELECTRODE REM PT RTRN 9FT ADLT (ELECTROSURGICAL) ×1 IMPLANT
GAUZE PETRO XEROFOAM 1X8 (MISCELLANEOUS) ×2 IMPLANT
GAUZE SPONGE 4X4 12PLY STRL (GAUZE/BANDAGES/DRESSINGS) ×2 IMPLANT
GAUZE STRETCH 2X75IN STRL (MISCELLANEOUS) ×2 IMPLANT
GAUZE XEROFORM 4X4 STRL (GAUZE/BANDAGES/DRESSINGS) ×2 IMPLANT
GLOVE BIO SURGEON STRL SZ7.5 (GLOVE) ×2 IMPLANT
GLOVE INDICATOR 8.0 STRL GRN (GLOVE) ×2 IMPLANT
GOWN STRL REUS W/ TWL LRG LVL3 (GOWN DISPOSABLE) ×2 IMPLANT
GOWN STRL REUS W/TWL LRG LVL3 (GOWN DISPOSABLE) ×2
HANDPIECE VERSAJET DEBRIDEMENT (MISCELLANEOUS) ×2 IMPLANT
KIT STIMULAN RAPID CURE 5CC (Orthopedic Implant) ×2 IMPLANT
LABEL OR SOLS (LABEL) ×2 IMPLANT
NEEDLE FILTER BLUNT 18X 1/2SAF (NEEDLE) ×1
NEEDLE FILTER BLUNT 18X1 1/2 (NEEDLE) ×1 IMPLANT
NEEDLE HYPO 25X1 1.5 SAFETY (NEEDLE) ×6 IMPLANT
NS IRRIG 500ML POUR BTL (IV SOLUTION) ×2 IMPLANT
PACK EXTREMITY ARMC (MISCELLANEOUS) ×2 IMPLANT
PAD ABD DERMACEA PRESS 5X9 (GAUZE/BANDAGES/DRESSINGS) ×4 IMPLANT
PENCIL ELECTRO HAND CTR (MISCELLANEOUS) ×2 IMPLANT
PULSAVAC PLUS IRRIG FAN TIP (DISPOSABLE) ×2
RASP SM TEAR CROSS CUT (RASP) IMPLANT
SOL .9 NS 3000ML IRR  AL (IV SOLUTION) ×1
SOL .9 NS 3000ML IRR UROMATIC (IV SOLUTION) ×1 IMPLANT
SOL PREP PVP 2OZ (MISCELLANEOUS) ×2
SOLUTION PREP PVP 2OZ (MISCELLANEOUS) ×1 IMPLANT
SPLINT CAST 1 STEP 4X30 (MISCELLANEOUS) ×2 IMPLANT
STOCKINETTE STRL 4IN 9604848 (GAUZE/BANDAGES/DRESSINGS) ×4 IMPLANT
STOCKINETTE STRL 6IN 960660 (GAUZE/BANDAGES/DRESSINGS) ×4 IMPLANT
SUT ETHILON 2 0 FS 18 (SUTURE) ×4 IMPLANT
SUT ETHILON 4-0 (SUTURE) ×1
SUT ETHILON 4-0 FS2 18XMFL BLK (SUTURE) ×1
SUT VIC AB 3-0 SH 27 (SUTURE) ×1
SUT VIC AB 3-0 SH 27X BRD (SUTURE) ×1 IMPLANT
SUT VIC AB 4-0 FS2 27 (SUTURE) ×2 IMPLANT
SUTURE ETHLN 4-0 FS2 18XMF BLK (SUTURE) ×1 IMPLANT
SYR 3ML LL SCALE MARK (SYRINGE) ×2 IMPLANT
SYRINGE 10CC LL (SYRINGE) ×4 IMPLANT
TIP FAN IRRIG PULSAVAC PLUS (DISPOSABLE) ×1 IMPLANT

## 2017-02-21 NOTE — Transfer of Care (Signed)
Immediate Anesthesia Transfer of Care Note  Patient: Charles [name]  Procedure(s) Performed: IRRIGATION AND DEBRIDEMENT FOOT (Right )  Patient Location: PACU  Anesthesia Type:General  Level of Consciousness: awake, alert  and oriented  Airway & Oxygen Therapy: Patient Spontanous Breathing and Patient connected to face mask oxygen  Post-op Assessment: Report given to RN and Post -op Vital signs reviewed and stable  Post vital signs: Reviewed and stable  Last Vitals:  Vitals:   02/20/17 1938 02/21/17 0710  BP: 138/71 135/87  Pulse: 83 77  Resp: 18 17  Temp: 37.1 C 36.5 C  SpO2: 98% 95%    Last Pain:  Vitals:   02/21/17 0710  TempSrc: Oral  PainSc: 4       Patients Stated Pain Goal: 4 (91/91/66 0600)  Complications: No apparent anesthesia complications

## 2017-02-21 NOTE — Consult Note (Signed)
Pharmacy Antibiotic Note  Charles [name] is a 56 y.o. male admitted on 02/19/2017 with foot infection s/p prcedure.  Pharmacy has been consulted for vancomycin dosing.  Plan: vancomycin trough resulted at 5. All does given on time. level drawn prior to the 5th dose. Due to the risk of accumulation i will increase the dose to 1500mg  q 8 hours. follow up cx for the need for vancomycin  Height: 6\' 2"  (188 cm) Weight: (!) 311 lb 9.6 oz (141.3 kg) IBW/kg (Calculated) : 82.2  Temp (24hrs), Avg:98.3 F (36.8 C), Min:97.7 F (36.5 C), Max:99.1 F (37.3 C)  Recent Labs  Lab 02/19/17 1952 02/20/17 0339 02/21/17 0424 02/21/17 1025  WBC 11.2* 8.1  --   --   CREATININE 1.02 1.05 0.94  --   VANCOTROUGH  --   --   --  5*    Estimated Creatinine Clearance: 131.3 mL/min (by C-G formula based on SCr of 0.94 mg/dL).    Allergies  Allergen Reactions  . Lisinopril Cough    Antimicrobials this admission: vancomycin 11/2 >>  unasyn 11/2 >>   Dose adjustments this admission:   Microbiology results: 11/2 wound: stain, GNR and CPG, culture GNR   Thank you for allowing pharmacy to be a part of this patient's care.  Ramond Dial, Pharm.D, BCPS Clinical Pharmacist  02/21/2017 11:12 AM

## 2017-02-21 NOTE — Anesthesia Procedure Notes (Signed)
Procedure Name: LMA Insertion Date/Time: 02/21/2017 8:18 AM Performed by: Clinton Sawyer, CRNA Pre-anesthesia Checklist: Patient identified, Emergency Drugs available, Suction available, Patient being monitored and Timeout performed Patient Re-evaluated:Patient Re-evaluated prior to induction Oxygen Delivery Method: Circle system utilized Preoxygenation: Pre-oxygenation with 100% oxygen Induction Type: IV induction LMA: LMA inserted LMA Size: 5.0 Number of attempts: 1 Tube secured with: Tape Dental Injury: Teeth and Oropharynx as per pre-operative assessment

## 2017-02-21 NOTE — Progress Notes (Signed)
Pt stated that he would not wear CPAP tonight

## 2017-02-21 NOTE — Interval H&P Note (Signed)
History and Physical Interval Note:  02/21/2017 7:54 AM  Charles [name]  has presented today for surgery, with the diagnosis of postoperative infection status post hallux valgus correction right foot   The various methods of treatment have been discussed with the patient and family. After consideration of risks, benefits and other options for treatment, the patient has consented to  Procedure(s): IRRIGATION AND DEBRIDEMENT FOOT (Right) as a surgical intervention .  The patient's history has been reviewed, patient examined, no change in status, stable for surgery.  I have reviewed the patient's chart and labs.  Questions were answered to the patient's satisfaction.     Durward Fortes

## 2017-02-21 NOTE — Anesthesia Postprocedure Evaluation (Signed)
Anesthesia Post Note  Patient: Charles [name]  Procedure(s) Performed: IRRIGATION AND DEBRIDEMENT FOOT (Right )  Patient location during evaluation: PACU Anesthesia Type: General Level of consciousness: awake and alert and oriented Pain management: pain level controlled Vital Signs Assessment: post-procedure vital signs reviewed and stable Respiratory status: spontaneous breathing Cardiovascular status: blood pressure returned to baseline Anesthetic complications: no     Last Vitals:  Vitals:   02/21/17 1018 02/21/17 1106  BP: 140/86 (!) 152/81  Pulse: 87 98  Resp: 18 18  Temp: 37 C 36.7 C  SpO2: 93% 91%    Last Pain:  Vitals:   02/21/17 1106  TempSrc: Oral  PainSc:                  Irina Okelly
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
## 2017-02-21 NOTE — Op Note (Signed)
Date of operation: 02/21/2017.  Surgeon: Durward Fortes DPM.  Preoperative diagnosis: Postoperative infection status post hallux valgus correction.  Postoperative diagnosis: Same.  Procedure: Wound irrigation  Anesthesia: LMA.  Hemostasis: Pneumatic tourniquet right ankle 250 mmHg.  Estimated blood loss: 5 cc.  Implants: Stimulan rapid cure antibiotic beads impregnated with vancomycin.  Injectables: 10 cc 0.5% bupivacaine plain.  Complications: None apparent.  Operative indications: This is a 56 year old male with recent hallux valgus surgery and subsequent repair due to a postoperative fracture at the surgical site. The patient developed some significant cellulitis in the foot with some drainage and decision was made for hospitalization and IV antibiotics and subsequent irrigation of the wound.  Operative procedure: Patient was taken to the operating room and placed on the table in the supine position. Following satisfactory LMA anesthesia a pneumatic tourniquet was applied at the level of the right ankle and the foot was prepped and draped in the usual sterile fashion. The foot was elevationaly exsanguinated and the tourniquet inflated to 250 mmHg.     Attention was directed to the dorsal aspect of the right foot where the previous sutures were removed from the first metatarsal area. Deeper Vicryl sutures were also removed and the wound was opened and exposed down to the level of the bone and joint. No frank pus could be expressed from the surrounding tissues. The deeper and superficial tissues were then thoroughly flushed using a versa jet on a setting of 2. The wound was then flushed with pulse irrigation using 3 L of saline. Good healthy appearance was noted at the surgical site. Stimulan rapid cure antibiotic beads impregnated with vancomycin were then placed in to the deeper tissues around the bone and joint areas and then the wound was closed using 2-0 nylon vertical mattress sutures  for deep closure followed by skin closure using 3-0 nylon simple interrupted sutures. A small area over the distal aspect of the incision at the joint was left open for drainage. 10 cc of 0.5% bupivacaine plain was then injected for postoperative analgesia followed by Xeroform 4 x 4's conformer ABDs and Kerlix. A fiberglass posterior plan was then applied to the right foot and leg with the foot at 90 relative to the leg followed by an Ace wrap. The patient was awakened and transported to the PACU with vital signs stable and in good condition.

## 2017-02-21 NOTE — Anesthesia Procedure Notes (Signed)
Procedure Name: Intubation Date/Time: 02/21/2017 8:25 AM Performed by: Clinton Sawyer, CRNA Pre-anesthesia Checklist: Patient identified, Emergency Drugs available, Suction available, Patient being monitored and Timeout performed Patient Re-evaluated:Patient Re-evaluated prior to induction Oxygen Delivery Method: Circle system utilized Preoxygenation: Pre-oxygenation with 100% oxygen Induction Type: IV induction Laryngoscope Size: Mac and 4 Grade View: Grade I Tube type: Oral Tube size: 7.5 mm Number of attempts: 1 Airway Equipment and Method: Stylet Placement Confirmation: ETT inserted through vocal cords under direct vision,  positive ETCO2,  CO2 detector and breath sounds checked- equal and bilateral Secured at: 23 cm Tube secured with: Tape Dental Injury: Teeth and Oropharynx as per pre-operative assessment

## 2017-02-21 NOTE — Plan of Care (Signed)
  Progressing Education: Knowledge of [facility] Education information/materials will improve 02/21/2017 1547 - Progressing by Montel Culver, RN Safety: Ability to remain free from injury will improve 02/21/2017 1547 - Progressing by Montel Culver, RN Health Behavior/Discharge Planning: Ability to manage health-related needs will improve 02/21/2017 1547 - Progressing by Montel Culver, RN Physical Regulation: Ability to maintain clinical measurements within normal limits will improve 02/21/2017 1547 - Progressing by Montel Culver, RN Will remain free from infection 02/21/2017 1547 - Progressing by Montel Culver, RN Skin Integrity: Risk for impaired skin integrity will decrease 02/21/2017 1547 - Progressing by Montel Culver, RN Tissue Perfusion: Risk factors for ineffective tissue perfusion will decrease 02/21/2017 1547 - Progressing by Montel Culver, RN Activity: Risk for activity intolerance will decrease 02/21/2017 1547 - Progressing by Montel Culver, RN

## 2017-02-21 NOTE — Anesthesia Post-op Follow-up Note (Signed)
Anesthesia QCDR form completed.        

## 2017-02-21 NOTE — Anesthesia Preprocedure Evaluation (Addendum)
Anesthesia Evaluation  Patient identified by MRN, date of birth, ID band Patient awake    Reviewed: Allergy & Precautions, H&P , NPO status , Patient's Chart, lab work & pertinent test results, reviewed documented beta blocker date and time   Airway Mallampati: II  TM Distance: >3 FB Neck ROM: full    Dental  (+) Partial Upper   Pulmonary shortness of breath and with exertion, sleep apnea ,    Pulmonary exam normal        Cardiovascular hypertension, Pt. on medications and Pt. on home beta blockers + angina with exertion + CAD and + Past MI  Normal cardiovascular exam     Neuro/Psych PSYCHIATRIC DISORDERS Anxiety Depression  Neuromuscular disease    GI/Hepatic PUD, GERD  ,  Endo/Other  diabetes, Well Controlled, Type 2Hypothyroidism Morbid obesity  Renal/GU      Musculoskeletal  (+) Arthritis , Osteoarthritis,    Abdominal (+) + obese,   Peds  Hematology  (+) anemia ,   Anesthesia Other Findings   Reproductive/Obstetrics                           Anesthesia Physical  Anesthesia Plan  ASA: III and emergent  Anesthesia Plan: General   Post-op Pain Management:    Induction: Intravenous  PONV Risk Score and Plan:   Airway Management Planned: Oral ETT and LMA  Additional Equipment:   Intra-op Plan:   Post-operative Plan: Extubation in OR  Informed Consent: I have reviewed the patients History and Physical, chart, labs and discussed the procedure including the risks, benefits and alternatives for the proposed anesthesia with the patient or authorized representative who has indicated his/her understanding and acceptance.     Plan Discussed with: CRNA and Surgeon  Anesthesia Plan Comments:        Anesthesia Quick Evaluation

## 2017-02-22 ENCOUNTER — Encounter: Payer: Self-pay | Admitting: Podiatry

## 2017-02-22 LAB — BASIC METABOLIC PANEL
ANION GAP: 7 (ref 5–15)
BUN: 14 mg/dL (ref 6–20)
CALCIUM: 8.7 mg/dL — AB (ref 8.9–10.3)
CO2: 28 mmol/L (ref 22–32)
Chloride: 102 mmol/L (ref 101–111)
Creatinine, Ser: 0.82 mg/dL (ref 0.61–1.24)
GFR calc Af Amer: >60 mL/min (ref >60)
GFR calc non Af Amer: >60 mL/min (ref >60)
GLUCOSE: 255 mg/dL — AB (ref 65–99)
Potassium: 3.9 mmol/L (ref 3.5–5.1)
Sodium: 137 mmol/L (ref 135–145)

## 2017-02-22 LAB — CBC WITH DIFFERENTIAL/PLATELET
BASOS ABS: 0 10*3/uL (ref 0–0.1)
Basophils Relative: 0 %
Eosinophils Absolute: 0 10*3/uL (ref 0–0.7)
Eosinophils Relative: 0 %
HEMATOCRIT: 42.4 % (ref 40.0–52.0)
Hemoglobin: 14.4 g/dL (ref 13.0–18.0)
LYMPHS PCT: 13 %
Lymphs Abs: 1 10*3/uL (ref 1.0–3.6)
MCH: 30.7 pg (ref 26.0–34.0)
MCHC: 33.9 g/dL (ref 32.0–36.0)
MCV: 90.7 fL (ref 80.0–100.0)
MONO ABS: 0.4 10*3/uL (ref 0.2–1.0)
MONOS PCT: 5 %
NEUTROS ABS: 6.6 10*3/uL — AB (ref 1.4–6.5)
Neutrophils Relative %: 82 %
Platelets: 209 10*3/uL (ref 150–440)
RBC: 4.68 MIL/uL (ref 4.40–5.90)
RDW: 15.1 % — AB (ref 11.5–14.5)
WBC: 8 10*3/uL (ref 3.8–10.6)

## 2017-02-22 LAB — GLUCOSE, CAPILLARY
GLUCOSE-CAPILLARY: 167 mg/dL — AB (ref 65–99)
GLUCOSE-CAPILLARY: 124 mg/dL — AB (ref 65–99)
GLUCOSE-CAPILLARY: 267 mg/dL — AB (ref 65–99)
GLUCOSE-CAPILLARY: 148 mg/dL — AB (ref 65–99)
GLUCOSE-CAPILLARY: 253 mg/dL — AB (ref 65–99)
Glucose-Capillary: 228 mg/dL — ABNORMAL HIGH (ref 65–99)
Glucose-Capillary: 251 mg/dL — ABNORMAL HIGH (ref 65–99)

## 2017-02-22 LAB — HEMOGLOBIN A1C
Hgb A1c MFr Bld: 6.5 % — ABNORMAL HIGH (ref 4.8–5.6)
MEAN PLASMA GLUCOSE: 139.85 mg/dL

## 2017-02-22 MED ORDER — LEVOFLOXACIN 750 MG PO TABS
750.0000 mg | ORAL_TABLET | Freq: Every day | ORAL | Status: DC
  Administered 2017-02-22: 750 mg via ORAL
  Filled 2017-02-22 (×2): qty 1

## 2017-02-22 MED ORDER — INSULIN ASPART 100 UNIT/ML ~~LOC~~ SOLN
0.0000 [IU] | Freq: Every day | SUBCUTANEOUS | Status: DC
  Administered 2017-02-22: 3 [IU] via SUBCUTANEOUS
  Administered 2017-02-25: 2 [IU] via SUBCUTANEOUS
  Filled 2017-02-22 (×2): qty 1

## 2017-02-22 MED ORDER — GABAPENTIN 300 MG PO CAPS
600.0000 mg | ORAL_CAPSULE | Freq: Three times a day (TID) | ORAL | Status: DC
  Administered 2017-02-22 – 2017-02-26 (×11): 600 mg via ORAL
  Filled 2017-02-22 (×11): qty 2

## 2017-02-22 MED ORDER — POLYETHYLENE GLYCOL 3350 17 G PO PACK
17.0000 g | PACK | Freq: Every day | ORAL | Status: DC
  Administered 2017-02-22 – 2017-02-26 (×5): 17 g via ORAL
  Filled 2017-02-22 (×5): qty 1

## 2017-02-22 MED ORDER — DOCUSATE SODIUM 100 MG PO CAPS
100.0000 mg | ORAL_CAPSULE | Freq: Two times a day (BID) | ORAL | Status: DC
  Administered 2017-02-22 – 2017-02-26 (×9): 100 mg via ORAL
  Filled 2017-02-22 (×10): qty 1

## 2017-02-22 MED ORDER — INSULIN ASPART 100 UNIT/ML ~~LOC~~ SOLN
0.0000 [IU] | Freq: Three times a day (TID) | SUBCUTANEOUS | Status: DC
  Administered 2017-02-22: 8 [IU] via SUBCUTANEOUS
  Administered 2017-02-22 – 2017-02-23 (×2): 2 [IU] via SUBCUTANEOUS
  Administered 2017-02-23: 8 [IU] via SUBCUTANEOUS
  Administered 2017-02-24 (×2): 3 [IU] via SUBCUTANEOUS
  Administered 2017-02-25: 2 [IU] via SUBCUTANEOUS
  Administered 2017-02-25: 3 [IU] via SUBCUTANEOUS
  Administered 2017-02-26: 2 [IU] via SUBCUTANEOUS
  Filled 2017-02-22 (×9): qty 1
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
## 2017-02-22 NOTE — Progress Notes (Signed)
1 Day Post-Op   Subjective/Chief Complaint: Patient seen. Still complains of significant pain in his right foot. Still concerned about his pain medication schedule. States that he did pull off his toenail and has had some bleeding. States he did have to get up on the foot to go to the bathroom.   Objective: Vital signs in last 24 hours: Temp:  [97.7 F (36.5 C)-98.2 F (36.8 C)] 97.9 F (36.6 C) (11/05 0752) Pulse Rate:  [87-113] 87 (11/05 0752) Resp:  [18] 18 (11/04 2015) BP: (146-164)/(76-93) 159/89 (11/05 0752) SpO2:  [94 %-98 %] 97 % (11/05 0752) Weight:  [142.2 kg (313 lb 9.6 oz)] 142.2 kg (313 lb 9.6 oz) (11/05 0553)    Intake/Output from previous day: 11/04 0701 - 11/05 0700 In: 2380 [P.O.:530; I.V.:700; IV Piggyback:1150] Out: 1601 [Urine:3350; Blood:30] Intake/Output this shift: Total I/O In: 240 [P.O.:240] Out: 400 [Urine:400]  The bandages dry and intact. No evidence of any bleeding or strikethrough at the surgical site area although there is some bleeding noted from the right third toenail area. Toenail has been traumatically avulsed  Lab Results:  Recent Labs    02/20/17 0339 02/22/17 0449  WBC 8.1 8.0  HGB 14.6 14.4  HCT 43.2 42.4  PLT 160 209   BMET Recent Labs    02/21/17 0424 02/22/17 0449  NA 138 137  K 3.9 3.9  CL 104 102  CO2 28 28  GLUCOSE 133* 255*  BUN 13 14  CREATININE 0.94 0.82  CALCIUM 8.6* 8.7*   PT/INR No results for input(s): LABPROT, INR in the last 72 hours. ABG No results for input(s): PHART, HCO3 in the last 72 hours.  Invalid input(s): PCO2, PO2  Studies/Results: No results found.  Anti-infectives: Anti-infectives (From admission, onward)   Start     Dose/Rate Route Frequency Ordered Stop   02/21/17 1130  vancomycin (VANCOCIN) 1,500 mg in sodium chloride 0.9 % 500 mL IVPB     1,500 mg 250 mL/hr over 120 Minutes Intravenous Every 8 hours 02/21/17 1111     02/20/17 0200  vancomycin (VANCOCIN) 1,250 mg in sodium  chloride 0.9 % 250 mL IVPB  Status:  Discontinued     1,250 mg 166.7 mL/hr over 90 Minutes Intravenous Every 8 hours 02/19/17 1922 02/21/17 1111   02/19/17 2000  vancomycin (VANCOCIN) 1,250 mg in sodium chloride 0.9 % 250 mL IVPB     1,250 mg 166.7 mL/hr over 90 Minutes Intravenous  Once 02/19/17 1919 02/19/17 2233   02/19/17 1900  Ampicillin-Sulbactam (UNASYN) 3 g in sodium chloride 0.9 % 100 mL IVPB     3 g 200 mL/hr over 30 Minutes Intravenous Every 6 hours 02/19/17 1841     02/19/17 1845  vancomycin (VANCOCIN) IVPB 1000 mg/200 mL premix  Status:  Discontinued     1,000 mg 200 mL/hr over 60 Minutes Intravenous Every 24 hours 02/19/17 1841 02/19/17 1919      Assessment/Plan: s/p Procedure(s): IRRIGATION AND DEBRIDEMENT FOOT (Right) Assessment: Stable status post irrigation right foot   Plan: Dressing was left intact today. Strongly encouraged the patient that he cannot put any weight on his forefoot. At this point we will leave his pain medication management up to internal medicine. For now we will continue IV antibiotics and switch the patient prior to discharge to orals. Plan on reassessing the wound tomorrow. Possibility of another irrigation is still not ruled out.  LOS: 3 days    Durward Fortes 02/22/2017

## 2017-02-22 NOTE — Progress Notes (Signed)
Patient refused bed alarm.    

## 2017-02-22 NOTE — Progress Notes (Signed)
Inpatient Diabetes Program Recommendations  AACE/ADA: New Consensus Statement on Inpatient Glycemic Control (2015)  Target Ranges:  Prepandial:   less than 140 mg/dL      Peak postprandial:   less than 180 mg/dL (1-2 hours)      Critically ill patients:  140 - 180 mg/dL   Results for Charles [name], Charles S "SCOTT" (MRN [id number]) as of 02/22/2017 09:58  Ref. Range 11/17/2016 15:42  Hemoglobin A1C Latest Ref Range: 4.8 - 5.6 % 5.8 (H)   Results for Charles [name], Charles S "SCOTT" (MRN [id number]) as of 02/22/2017 09:58  Ref. Range 02/21/2017 07:24 02/21/2017 21:12 02/22/2017 08:08  Glucose-Capillary Latest Ref Range: 65 - 99 mg/dL 124 (H) 335 (H) 251 (H)   Admit with: R Foot Infection S/P hallux valgus correction  History: No History of DM noted  Current Insulin Orders: Novolog Moderate Correction Scale/ SSI (0-15 units) TID AC + HS       MD- Note patient received 10 mg Decadron X 1 dose yesterday at 8:30am perioperatively.  CBGs elevated last PM and again this AM.  Note Novolog SSI to start today at 12pm.  Last A1c back in July was OK.  May consider ordering a current Hemoglobin A1c to check glucose average for the past 3 months.       --Will follow patient during hospitalization--  Wyn Quaker RN, MSN, CDE Diabetes Coordinator Inpatient Glycemic Control Team Team Pager: [phone] (8a-5p)

## 2017-02-23 LAB — GLUCOSE, CAPILLARY
GLUCOSE-CAPILLARY: 135 mg/dL — AB (ref 65–99)
GLUCOSE-CAPILLARY: 141 mg/dL — AB (ref 65–99)
Glucose-Capillary: 260 mg/dL — ABNORMAL HIGH (ref 65–99)
Glucose-Capillary: 116 mg/dL — ABNORMAL HIGH (ref 65–99)

## 2017-02-23 MED ORDER — SODIUM CHLORIDE 0.9% FLUSH
10.0000 mL | Freq: Two times a day (BID) | INTRAVENOUS | Status: DC
  Administered 2017-02-24 – 2017-02-26 (×5): 10 mL

## 2017-02-23 MED ORDER — LIVING WELL WITH DIABETES BOOK
Freq: Once | Status: AC
  Administered 2017-02-23: 20:00:00
  Filled 2017-02-23 (×2): qty 1

## 2017-02-23 MED ORDER — OXYCODONE HCL 5 MG PO TABS
15.0000 mg | ORAL_TABLET | Freq: Every day | ORAL | Status: DC
  Administered 2017-02-23 – 2017-02-26 (×17): 15 mg via ORAL
  Filled 2017-02-23 (×17): qty 3

## 2017-02-23 MED ORDER — SODIUM CHLORIDE 0.9% FLUSH
10.0000 mL | INTRAVENOUS | Status: DC | PRN
  Administered 2017-02-23: 10 mL
  Filled 2017-02-23: qty 40

## 2017-02-23 MED ORDER — CIPROFLOXACIN HCL 500 MG PO TABS
500.0000 mg | ORAL_TABLET | Freq: Two times a day (BID) | ORAL | Status: DC
  Administered 2017-02-23 – 2017-02-26 (×6): 500 mg via ORAL
  Filled 2017-02-23 (×7): qty 1

## 2017-02-23 MED ORDER — SODIUM CHLORIDE 0.9 % IV SOLN
3.0000 g | Freq: Four times a day (QID) | INTRAVENOUS | Status: DC
  Administered 2017-02-23 – 2017-02-26 (×12): 3 g via INTRAVENOUS
  Filled 2017-02-23 (×16): qty 3

## 2017-02-23 NOTE — Progress Notes (Signed)
Inpatient Diabetes Program Recommendations  AACE/ADA: New Consensus Statement on Inpatient Glycemic Control (2015)  Target Ranges:  Prepandial:   less than 140 mg/dL      Peak postprandial:   less than 180 mg/dL (1-2 hours)      Critically ill patients:  140 - 180 mg/dL   Review of Glycemic Control  Diabetes history: DM 2 prior to Gastric Bypass surgery over 10 years ago. Patient has gained some weight back since then. Was over 400 pounds.  Reviewed spoke with patent about diabetes diagnosis.  Discussed A1C results (6.5%) and explained what an A1C is. Discussed basic pathophysiology of DM Type 2, basic home care, importance of checking CBGs and maintaining good CBG control to prevent long-term and short-term complications. Reviewed glucose and A1C goals. Reviewed signs and symptoms of hyperglycemia and hypoglycemia along with treatment for both. Discussed impact of nutrition, exercise, stress, sickness, and medications on diabetes control. Emphasized alterations in diet for now since patient has limited mobility ordered for his right foot. Ordered Living Well with diabetes booklet and encouraged patient to read through entire book. Patient drinks a lot of gatorade and eats a lot of bread.  Patient also likes crystal light and coke zero which are better options. Asked patient to check his glucose 1-2 times per day. Patient reports having a glucometer and will contact insurance for testing strips. RNs to provide ongoing basic DM education at bedside with this patient.   Note patient with Gatorade at bedside. Instructed patient and wife to not drink. Patient has a stash of coke zero sugar free creamer and a bottle of salt in his bedside table.  Thanks, Tama Headings RN, MSN, [facility] Inpatient Diabetes Coordinator Team Pager [phone] (8a-5p)

## 2017-02-23 NOTE — Progress Notes (Signed)
2 Days Post-Op   Subjective/Chief Complaint: Patient seen. Still some pain in the right foot   Objective: Vital signs in last 24 hours: Temp:  [97.7 F (36.5 C)] 97.7 F (36.5 C) (11/05 1936) Pulse Rate:  [70-81] 81 (11/05 1936) Resp:  [18] 18 (11/05 1936) BP: (138-145)/(83-86) 142/86 (11/06 0847) SpO2:  [96 %-97 %] 96 % (11/05 1936) Weight:  [142.1 kg (313 lb 3.2 oz)] 142.1 kg (313 lb 3.2 oz) (11/06 0505) Last BM Date: 02/22/17  Intake/Output from previous day: 11/05 0701 - 11/06 0700 In: 1680 [P.O.:1680] Out: 3500 [Urine:3500] Intake/Output this shift: Total I/O In: 240 [P.O.:240] Out: 700 [Urine:700]  Moderate bleeding on the bandage. Upon removal there is some mild drainage and purulence. Significant reduction in the erythema and edema in the right foot. Distal aspect of the incision has dehisced open some with some antibiotic beads protruding through the wound.  Lab Results:  Recent Labs    02/22/17 0449  WBC 8.0  HGB 14.4  HCT 42.4  PLT 209   BMET Recent Labs    02/21/17 0424 02/22/17 0449  NA 138 137  K 3.9 3.9  CL 104 102  CO2 28 28  GLUCOSE 133* 255*  BUN 13 14  CREATININE 0.94 0.82  CALCIUM 8.6* 8.7*   PT/INR No results for input(s): LABPROT, INR in the last 72 hours. ABG No results for input(s): PHART, HCO3 in the last 72 hours.  Invalid input(s): PCO2, PO2  Studies/Results: No results found.  Anti-infectives: Anti-infectives (From admission, onward)   Start     Dose/Rate Route Frequency Ordered Stop   02/22/17 1630  levofloxacin (LEVAQUIN) tablet 750 mg     750 mg Oral Daily 02/22/17 1601     02/21/17 1130  vancomycin (VANCOCIN) 1,500 mg in sodium chloride 0.9 % 500 mL IVPB  Status:  Discontinued     1,500 mg 250 mL/hr over 120 Minutes Intravenous Every 8 hours 02/21/17 1111 02/22/17 1601   02/20/17 0200  vancomycin (VANCOCIN) 1,250 mg in sodium chloride 0.9 % 250 mL IVPB  Status:  Discontinued     1,250 mg 166.7 mL/hr over 90  Minutes Intravenous Every 8 hours 02/19/17 1922 02/21/17 1111   02/19/17 2000  vancomycin (VANCOCIN) 1,250 mg in sodium chloride 0.9 % 250 mL IVPB     1,250 mg 166.7 mL/hr over 90 Minutes Intravenous  Once 02/19/17 1919 02/19/17 2233   02/19/17 1900  Ampicillin-Sulbactam (UNASYN) 3 g in sodium chloride 0.9 % 100 mL IVPB  Status:  Discontinued     3 g 200 mL/hr over 30 Minutes Intravenous Every 6 hours 02/19/17 1841 02/22/17 1601   02/19/17 1845  vancomycin (VANCOCIN) IVPB 1000 mg/200 mL premix  Status:  Discontinued     1,000 mg 200 mL/hr over 60 Minutes Intravenous Every 24 hours 02/19/17 1841 02/19/17 1919      Assessment/Plan: s/p Procedure(s): IRRIGATION AND DEBRIDEMENT FOOT (Right) Assessment: Improving status post irrigation postoperative infection   Plan: The wound was flushed with 50 cc of sterile saline. A saline wet-to-dry gauze was placed into the wound followed by a bulky dry sterile bandage. Awaiting infectious disease consult but would recommend that the patient goes back on IV antibiotics due to the plates and screws in the wound and recent infection. Recent infection. Most likely would recommend a couple of weeks worth of IV antibiotics. Repeat CBC for tomorrow morning. At this point we will plan on keeping him in for at least another couple of days  and reassessing the wound on Thursday as he may still need an additional debridement if still signs of infection.  LOS: 4 days    Durward Fortes 02/23/2017
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
## 2017-02-23 NOTE — Care Management (Addendum)
Met with patient at bedside to discuss discharge planning. Patient lives at home with his wife that use to be a CNA. He has a daughter that lives close that is a CNA. He states his daughter or wife can do daily dressing changes as needed at home. Also discussed home health for IV abx. He has no agency preference. Heads up referral to [facility] for long term IV antibiotics. PICC line planned for 11/07. Following progression

## 2017-02-23 NOTE — Plan of Care (Signed)
Patient refusing bed alarm or assistance with mobility. Will not use Foot pumps/teds or  comply with NWB restrictions.

## 2017-02-23 NOTE — Progress Notes (Signed)
Rodman Key, RN updated that IV team will place PICC 02-24-2017.  Carolee Rota, RN VAST

## 2017-02-23 NOTE — Progress Notes (Signed)
Inpatient Diabetes Program Recommendations  AACE/ADA: New Consensus Statement on Inpatient Glycemic Control (2015)  Target Ranges:  Prepandial:   less than 140 mg/dL      Peak postprandial:   less than 180 mg/dL (1-2 hours)      Critically ill patients:  140 - 180 mg/dL   Results for Geesey, Pasco S "SCOTT" (MRN [id number]) as of 02/23/2017 09:27  Ref. Range 02/22/2017 08:08 02/22/2017 12:15 02/22/2017 16:34 02/22/2017 21:06  Glucose-Capillary Latest Ref Range: 65 - 99 mg/dL 251 (H) 267 (H) 148 (H) 253 (H)   Results for THOMLEY, Shanard S "SCOTT" (MRN [id number]) as of 02/23/2017 09:27  Ref. Range 11/17/2016 15:42 02/22/2017 04:49  Hemoglobin A1C Latest Ref Range: 4.8 - 5.6 % 5.8 (H) 6.5 (H)    Admit with: R Foot Infection S/P hallux valgus correction  History: No History of DM noted  Current Insulin Orders: Novolog Moderate Correction Scale/ SSI (0-15 units) TID AC + HS      MD- Note current Hemoglobin A1c level elevated to 6.5%.  Last A1c back in July was WNL at 5.8%.  Is this a new diagnosis of DM for patient?  Per ADA Guidelines, Hemoglobin A1c of 6.5% or higher is indicative of a positive diagnosis of DM.  Please address with patient.  If this is a new diagnosis, patient will need basic diabetes survival skills education prior to d/c home.      --Will follow patient during hospitalization--  Wyn Quaker RN, MSN, CDE Diabetes Coordinator Inpatient Glycemic Control Team Team Pager: [phone] (8a-5p)

## 2017-02-23 NOTE — Progress Notes (Signed)
Infectious Disease Long Term IV Antibiotic Orders ZAVIOR THOMASON 1960/11/08  Diagnosis: R foot infection with hardware in place  Culture results Specimen Description WOUND   Special Requests RIGHT FOOT   Gram Stain RARE WBC PRESENT, PREDOMINANTLY PMN  FEW GRAM NEGATIVE RODS  FEW GRAM POSITIVE COCCI IN PAIRS     Culture MODERATE ENTEROBACTER CLOACAE  FEW ENTEROCOCCUS FAECALIS  NO ANAEROBES ISOLATED; CULTURE IN PROGRESS FOR 5 DAYS     Report Status PENDING   Organism ID, Bacteria ENTEROBACTER CLOACAE   Organism ID, Bacteria ENTEROCOCCUS FAECALIS   Resulting Agency [facility]  Susceptibility    Enterobacter cloacae Enterococcus faecalis    MIC MIC    AMPICILLIN   <=2 SENSITIVE  Sensitive    CEFAZOLIN >=64 RESIST... Resistant      CEFEPIME <=1 SENSITIVE  Sensitive      CEFTAZIDIME <=1 SENSITIVE  Sensitive      CEFTRIAXONE <=1 SENSITIVE  Sensitive      CIPROFLOXACIN <=0.25 SENS... Sensitive      GENTAMICIN <=1 SENSITIVE  Sensitive      GENTAMICIN SYNERGY    Resistant1    IMIPENEM <=0.25 SENS... Sensitive      LEVOFLOXACIN   1 SENSITIVE  Sensitive    PIP/TAZO <=4 SENSITIVE  Sensitive      TRIMETH/SULFA <=20 SENSIT... Sensitive      VANCOMYCIN   1 SENSITIVE  Sensitive           1 (Enterococcus faecalis/GENTAMICIN SYNERGY)   RESISTANT     LABS Lab Results  Component Value Date   CREATININE 0.82 02/22/2017   Lab Results  Component Value Date   WBC 8.0 02/22/2017   HGB 14.4 02/22/2017   HCT 42.4 02/22/2017   MCV 90.7 02/22/2017   PLT 209 02/22/2017   No results found for: ESRSEDRATE, POCTSEDRATE No results found for: CRP  Allergies:  Allergies  Allergen Reactions  . Lisinopril Cough    Discharge antibiotics Unasyn 12  gm q 24 hours continuous infusion  Ciprofloxacin oral 500 mg bid  PICC Care per protocol Labs weekly while on IV antibiotics -FAX weekly labs to [phone] CBC w diff   Comprehensive met panel CRP   Planned duration of  antibiotics  Stop date 03/09/17   Follow up clinic date 1-2 weeks   Leonel Ramsay, MD

## 2017-02-23 NOTE — Consult Note (Signed)
[facility] Infectious Disease     Reason for Consult:R foot post op infection    Referring Physician: Kalman Jewels Date of Admission:  02/19/2017   Active Problems:   Foot infection   HPI: Charles [name] is a 56 y.o. male admitted with R foot darinage and redness. He underwent surgery for bunion initiatlly on 10/24 with correction of R foot hallus valgus.  However he injured it and had displaced R great toe joint and patient had to undergo on 10/31  ORI of the displace osteotomy with placement of metal hardware. He developed increased bleeding and redness and was admitted with infection. Dr Cleda Mccreedy did on 11/4 an I and D with abx bead placement. Cx with enterobacter and enterococcus.  On admission wbc was 11.1, now down to 8.  Newly dxed DM but A1c only 6.5.    Past Medical History:  Diagnosis Date  . Anginal pain ([place])   . Anxiety   . Arthritis   . Chronic back pain   . Coronary artery disease   . Depression   . GERD (gastroesophageal reflux disease)   . Hypertension   . Hypothyroidism   . Insomnia   . Low testosterone   . Myocardial infarction ([place])   . Peptic ulcer   . Shortness of breath   . Sleep apnea    has cpap-has not used since lost 140lb  . Spinal cord stimulator status    has a scs  . Wears glasses    Past Surgical History:  Procedure Laterality Date  . BACK SURGERY    . FACIAL FRACTURE SURGERY     face-upper jaw with dental implants  . GASTRIC BYPASS  2011   has lost 140lb  . REPAIR TENDONS FOOT  2002   rt foot  . SPINAL CORD STIMULATOR IMPLANT  6/13   Social History   Tobacco Use  . Smoking status: Never Smoker  . Smokeless tobacco: Never Used  Substance Use Topics  . Alcohol use: No    Alcohol/week: 0.0 oz    Comment: not in 4 yr  . Drug use: No   Family History  Problem Relation Age of Onset  . Diabetes Father     Allergies:  Allergies  Allergen Reactions  . Lisinopril Cough    Current antibiotics: Antibiotics Given (last 72 hours)     Date/Time Action Medication Dose Rate   02/20/17 1349 New Bag/Given   Ampicillin-Sulbactam (UNASYN) 3 g in sodium chloride 0.9 % 100 mL IVPB 3 g 200 mL/hr   02/20/17 1820 New Bag/Given   vancomycin (VANCOCIN) 1,250 mg in sodium chloride 0.9 % 250 mL IVPB 1,250 mg 166.7 mL/hr   02/20/17 2100 New Bag/Given   Ampicillin-Sulbactam (UNASYN) 3 g in sodium chloride 0.9 % 100 mL IVPB 3 g 200 mL/hr   02/21/17 0300 New Bag/Given   Ampicillin-Sulbactam (UNASYN) 3 g in sodium chloride 0.9 % 100 mL IVPB 3 g 200 mL/hr   02/21/17 1131 New Bag/Given   vancomycin (VANCOCIN) 1,500 mg in sodium chloride 0.9 % 500 mL IVPB 1,500 mg 250 mL/hr   02/21/17 1540 New Bag/Given   Ampicillin-Sulbactam (UNASYN) 3 g in sodium chloride 0.9 % 100 mL IVPB 3 g 200 mL/hr   02/21/17 2038 New Bag/Given   Ampicillin-Sulbactam (UNASYN) 3 g in sodium chloride 0.9 % 100 mL IVPB 3 g 200 mL/hr   02/21/17 2123 New Bag/Given   vancomycin (VANCOCIN) 1,500 mg in sodium chloride 0.9 % 500 mL IVPB 1,500 mg  250 mL/hr   02/22/17 0200 New Bag/Given   Ampicillin-Sulbactam (UNASYN) 3 g in sodium chloride 0.9 % 100 mL IVPB 3 g 200 mL/hr   02/22/17 0510 New Bag/Given   vancomycin (VANCOCIN) 1,500 mg in sodium chloride 0.9 % 500 mL IVPB 1,500 mg 250 mL/hr   02/22/17 1003 New Bag/Given   Ampicillin-Sulbactam (UNASYN) 3 g in sodium chloride 0.9 % 100 mL IVPB 3 g 200 mL/hr   02/22/17 1137 New Bag/Given   vancomycin (VANCOCIN) 1,500 mg in sodium chloride 0.9 % 500 mL IVPB 1,500 mg 250 mL/hr   02/22/17 1634 Given   levofloxacin (LEVAQUIN) tablet 750 mg 750 mg       MEDICATIONS: . acidophilus  1 capsule Oral Daily  . ALPRAZolam  1 mg Oral TID  . amLODipine  5 mg Oral Daily  . aspirin EC  81 mg Oral Daily  . atorvastatin  40 mg Oral q1800  . busPIRone  30 mg Oral BID  . carvedilol  6.25 mg Oral BID WC  . docusate sodium  100 mg Oral BID  . DULoxetine  60 mg Oral Daily  . gabapentin  600 mg Oral TID  . heparin  5,000 Units Subcutaneous  Q8H  . insulin aspart  0-15 Units Subcutaneous TID WC  . insulin aspart  0-5 Units Subcutaneous QHS  . levofloxacin  750 mg Oral Daily  . Levomilnacipran HCl ER  40 mg Oral Daily  . levothyroxine  75 mcg Oral QAC breakfast  . living well with diabetes book   Does not apply Once  . morphine  15 mg Oral QHS  . morphine  30 mg Oral q morning - 10a  . multivitamin with minerals  1 tablet Oral Daily  . oxyCODONE  10 mg Oral 6 X Daily  . pantoprazole  40 mg Oral Daily  . polyethylene glycol  17 g Oral Daily  . senna-docusate  1 tablet Oral BID  . sildenafil  20 mg Oral TID  . Testosterone Undecanoate  750 mg Intramuscular Weekly  . ticagrelor  90 mg Oral BID  . zolpidem  10 mg Oral QHS    Review of Systems - 11 systems reviewed and negative per HPI   OBJECTIVE: Temp:  [97.7 F (36.5 C)] 97.7 F (36.5 C) (11/05 1936) Pulse Rate:  [70-81] 81 (11/05 1936) Resp:  [18] 18 (11/05 1936) BP: (138-145)/(83-86) 142/86 (11/06 0847) SpO2:  [96 %-97 %] 96 % (11/05 1936) Weight:  [142.1 kg (313 lb 3.2 oz)] 142.1 kg (313 lb 3.2 oz) (11/06 0505) Physical Exam  Constitutional: He is oriented to person, place, and time. obese HENT: anicteric Mouth/Throat: Oropharynx is clear and moist. No oropharyngeal exudate.  Cardiovascular: Normal rate, regular rhythm and normal heart sounds. Exam reveals no gallop and no friction rub.  No murmur heard.  Pulmonary/Chest: Effort normal and breath sounds normal. No respiratory distress. He has no wheezes.  Abdominal: Soft. Bowel sounds are normal. He exhibits no distension. There is no tenderness.  Lymphadenopathy:  He has no cervical adenopathy.  Neurological: He is alert and oriented to person, place, and time.  Skin: Skin is warm and dry. No rash noted. No erythema.  Psychiatric: He has a normal mood and affect. His behavior is normal.     LABS: Results for orders placed or performed during the hospital encounter of 02/19/17 (from the past 48 hour(s))   Glucose, capillary     Status: Abnormal   Collection Time: 02/21/17  9:12 PM  Result Value Ref Range   Glucose-Capillary 335 (H) 65 - 99 mg/dL   Comment 1 Notify RN   CBC with Differential/Platelet     Status: Abnormal   Collection Time: 02/22/17  4:49 AM  Result Value Ref Range   WBC 8.0 3.8 - 10.6 K/uL   RBC 4.68 4.40 - 5.90 MIL/uL   Hemoglobin 14.4 13.0 - 18.0 g/dL   HCT 42.4 40.0 - 52.0 %   MCV 90.7 80.0 - 100.0 fL   MCH 30.7 26.0 - 34.0 pg   MCHC 33.9 32.0 - 36.0 g/dL   RDW 15.1 (H) 11.5 - 14.5 %   Platelets 209 150 - 440 K/uL   Neutrophils Relative % 82 %   Neutro Abs 6.6 (H) 1.4 - 6.5 K/uL   Lymphocytes Relative 13 %   Lymphs Abs 1.0 1.0 - 3.6 K/uL   Monocytes Relative 5 %   Monocytes Absolute 0.4 0.2 - 1.0 K/uL   Eosinophils Relative 0 %   Eosinophils Absolute 0.0 0 - 0.7 K/uL   Basophils Relative 0 %   Basophils Absolute 0.0 0 - 0.1 K/uL  Basic metabolic panel     Status: Abnormal   Collection Time: 02/22/17  4:49 AM  Result Value Ref Range   Sodium 137 135 - 145 mmol/L   Potassium 3.9 3.5 - 5.1 mmol/L   Chloride 102 101 - 111 mmol/L   CO2 28 22 - 32 mmol/L   Glucose, Bld 255 (H) 65 - 99 mg/dL   BUN 14 6 - 20 mg/dL   Creatinine, Ser 0.82 0.61 - 1.24 mg/dL   Calcium 8.7 (L) 8.9 - 10.3 mg/dL   GFR calc non Af Amer >60 >60 mL/min   GFR calc Af Amer >60 >60 mL/min    Comment: (NOTE) The eGFR has been calculated using the CKD EPI equation. This calculation has not been validated in all clinical situations. eGFR's persistently <60 mL/min signify possible Chronic Kidney Disease.    Anion gap 7 5 - 15  Hemoglobin A1c     Status: Abnormal   Collection Time: 02/22/17  4:49 AM  Result Value Ref Range   Hgb A1c MFr Bld 6.5 (H) 4.8 - 5.6 %    Comment: (NOTE) Pre diabetes:          5.7%-6.4% Diabetes:              >6.4% Glycemic control for   <7.0% adults with diabetes    Mean Plasma Glucose 139.85 mg/dL    Comment: Performed at [facility]  [address] [place], [place] [address]  Glucose, capillary     Status: Abnormal   Collection Time: 02/22/17  8:08 AM  Result Value Ref Range   Glucose-Capillary 251 (H) 65 - 99 mg/dL   Comment 1 Notify RN   Glucose, capillary     Status: Abnormal   Collection Time: 02/22/17 12:15 PM  Result Value Ref Range   Glucose-Capillary 267 (H) 65 - 99 mg/dL   Comment 1 Notify RN   Glucose, capillary     Status: Abnormal   Collection Time: 02/22/17  4:34 PM  Result Value Ref Range   Glucose-Capillary 148 (H) 65 - 99 mg/dL  Glucose, capillary     Status: Abnormal   Collection Time: 02/22/17  9:06 PM  Result Value Ref Range   Glucose-Capillary 253 (H) 65 - 99 mg/dL   Comment 1 Notify RN   Glucose, capillary     Status: Abnormal  Collection Time: 02/23/17  7:29 AM  Result Value Ref Range   Glucose-Capillary 135 (H) 65 - 99 mg/dL  Glucose, capillary     Status: Abnormal   Collection Time: 02/23/17 11:32 AM  Result Value Ref Range   Glucose-Capillary 260 (H) 65 - 99 mg/dL   No components found for: ESR, C REACTIVE PROTEIN MICRO: Recent Results (from the past 720 hour(s))  Aerobic/Anaerobic Culture (surgical/deep wound)     Status: None (Preliminary result)   Collection Time: 02/19/17  5:00 PM  Result Value Ref Range Status   Specimen Description WOUND  Final   Special Requests RIGHT FOOT  Final   Gram Stain   Final    RARE WBC PRESENT, PREDOMINANTLY PMN FEW GRAM NEGATIVE RODS FEW GRAM POSITIVE COCCI IN PAIRS    Culture   Final    MODERATE ENTEROBACTER CLOACAE FEW ENTEROCOCCUS FAECALIS NO ANAEROBES ISOLATED; CULTURE IN PROGRESS FOR 5 DAYS    Report Status PENDING  Incomplete   Organism ID, Bacteria ENTEROBACTER CLOACAE  Final   Organism ID, Bacteria ENTEROCOCCUS FAECALIS  Final      Susceptibility   Enterobacter cloacae - MIC*    CEFAZOLIN >=64 RESISTANT Resistant     CEFEPIME <=1 SENSITIVE Sensitive     CEFTAZIDIME <=1 SENSITIVE Sensitive     CEFTRIAXONE <=1 SENSITIVE Sensitive      CIPROFLOXACIN <=0.25 SENSITIVE Sensitive     GENTAMICIN <=1 SENSITIVE Sensitive     IMIPENEM <=0.25 SENSITIVE Sensitive     TRIMETH/SULFA <=20 SENSITIVE Sensitive     PIP/TAZO <=4 SENSITIVE Sensitive     * MODERATE ENTEROBACTER CLOACAE   Enterococcus faecalis - MIC*    AMPICILLIN <=2 SENSITIVE Sensitive     LEVOFLOXACIN 1 SENSITIVE Sensitive     VANCOMYCIN 1 SENSITIVE Sensitive     GENTAMICIN SYNERGY Value in next row Resistant      RESISTANTPerformed at [facility], [address] [place], [place] [address]    * FEW ENTEROCOCCUS FAECALIS    IMAGING: Dg Foot Complete Right  Result Date: 02/20/2017 CLINICAL DATA:  56 year old male with a history of increasing pain. EXAM: RIGHT FOOT COMPLETE - 3+ VIEW COMPARISON:  09/24/2011 FINDINGS: Overlying casting material somewhat obscures bony details, however, postsurgical changes at the first metatarsal are evident with changes of osteotomy without significant healing at the surgical site. Plate screw fixation at the osteotomy site without hardware fracture evident. Soft tissue swelling is present along the medial forefoot. Lateral view demonstrates gas within the soft tissues. Surgical changes of prior calcaneal fixation again evident. No fracture lines identified. IMPRESSION: Surgical changes of right first metatarsal osteotomy and fixation without significant bony callus at the osteotomy site. Soft tissue swelling along the medial forefoot at the surgical site with gas in the soft tissues, nonspecific. Electronically Signed   By: Corrie Mckusick D.O.   On: 02/20/2017 12:08    Assessment:   Charles [name] is a 56 y.o. male with post op infection of R foot at site of previous bunion repair 10/24 which was then fractured and required ORIF and plate placement 12/19. The site has now had a wash out and bead placement. WBC was 11, now down to 8. New dx DM but A1c only 6.5.  Cx with enterococcus, enterobacter.   Recommendations Discussed with Dr  Cleda Mccreedy and with patient.  Will need PICC Line and likely 2 weeks IV abx to aggressively treat this infection given the presence of exposed hardware. Ordered PICC See IV  abx order sheet Thank you very much for allowing me to participate in the care of this patient. Please call with questions.   Cheral Marker. Ola Spurr, MD

## 2017-02-24 LAB — GLUCOSE, CAPILLARY
GLUCOSE-CAPILLARY: 116 mg/dL — AB (ref 65–99)
GLUCOSE-CAPILLARY: 163 mg/dL — AB (ref 65–99)
Glucose-Capillary: 182 mg/dL — ABNORMAL HIGH (ref 65–99)
Glucose-Capillary: 171 mg/dL — ABNORMAL HIGH (ref 65–99)

## 2017-02-24 LAB — BASIC METABOLIC PANEL
ANION GAP: 6 (ref 5–15)
BUN: 15 mg/dL (ref 6–20)
CALCIUM: 8.5 mg/dL — AB (ref 8.9–10.3)
CHLORIDE: 103 mmol/L (ref 101–111)
CO2: 30 mmol/L (ref 22–32)
Creatinine, Ser: 1.09 mg/dL (ref 0.61–1.24)
GFR calc Af Amer: >60 mL/min (ref >60)
GFR calc non Af Amer: >60 mL/min (ref >60)
GLUCOSE: 146 mg/dL — AB (ref 65–99)
Potassium: 3.9 mmol/L (ref 3.5–5.1)
Sodium: 139 mmol/L (ref 135–145)

## 2017-02-24 LAB — AEROBIC/ANAEROBIC CULTURE W GRAM STAIN (SURGICAL/DEEP WOUND)

## 2017-02-24 LAB — AEROBIC/ANAEROBIC CULTURE (SURGICAL/DEEP WOUND)

## 2017-02-24 LAB — C-REACTIVE PROTEIN: CRP: 1.1 mg/dL — ABNORMAL HIGH (ref <1.0)

## 2017-02-24 LAB — SEDIMENTATION RATE: SED RATE: 32 mm/h — AB (ref 0–20)

## 2017-02-24 NOTE — Progress Notes (Signed)
3 Days Post-Op   Subjective/Chief Complaint: Patient seen. Pain improving some.   Objective: Vital signs in last 24 hours: Temp:  [97.6 F (36.4 C)-98.2 F (36.8 C)] 97.9 F (36.6 C) (11/07 0735) Pulse Rate:  [72-76] 72 (11/07 0735) Resp:  [18-20] 18 (11/06 1952) BP: (127-135)/(73-96) 134/82 (11/07 1004) SpO2:  [94 %-96 %] 96 % (11/07 0735) Weight:  [142.5 kg (314 lb 3.2 oz)] 142.5 kg (314 lb 3.2 oz) (11/07 0432) Last BM Date: 02/23/17  Intake/Output from previous day: 11/06 0701 - 11/07 0700 In: 940 [P.O.:840; IV Piggyback:100] Out: 2050 [Urine:2050] Intake/Output this shift: Total I/O In: 480 [P.O.:480] Out: 500 [Urine:500]  The bandage is dry and intact. No evidence of any strikethrough on the bandaging.  Lab Results:  Recent Labs    02/22/17 0449  WBC 8.0  HGB 14.4  HCT 42.4  PLT 209   BMET Recent Labs    02/22/17 0449 02/24/17 0627  NA 137 139  K 3.9 3.9  CL 102 103  CO2 28 30  GLUCOSE 255* 146*  BUN 14 15  CREATININE 0.82 1.09  CALCIUM 8.7* 8.5*   PT/INR No results for input(s): LABPROT, INR in the last 72 hours. ABG No results for input(s): PHART, HCO3 in the last 72 hours.  Invalid input(s): PCO2, PO2  Studies/Results: No results found.  Anti-infectives: Anti-infectives (From admission, onward)   Start     Dose/Rate Route Frequency Ordered Stop   02/23/17 2000  ciprofloxacin (CIPRO) tablet 500 mg     500 mg Oral 2 times daily 02/23/17 1420     02/23/17 1430  Ampicillin-Sulbactam (UNASYN) 3 g in sodium chloride 0.9 % 100 mL IVPB     3 g 200 mL/hr over 30 Minutes Intravenous Every 6 hours 02/23/17 1420     02/22/17 1630  levofloxacin (LEVAQUIN) tablet 750 mg  Status:  Discontinued     750 mg Oral Daily 02/22/17 1601 02/23/17 1420   02/21/17 1130  vancomycin (VANCOCIN) 1,500 mg in sodium chloride 0.9 % 500 mL IVPB  Status:  Discontinued     1,500 mg 250 mL/hr over 120 Minutes Intravenous Every 8 hours 02/21/17 1111 02/22/17 1601   02/20/17 0200  vancomycin (VANCOCIN) 1,250 mg in sodium chloride 0.9 % 250 mL IVPB  Status:  Discontinued     1,250 mg 166.7 mL/hr over 90 Minutes Intravenous Every 8 hours 02/19/17 1922 02/21/17 1111   02/19/17 2000  vancomycin (VANCOCIN) 1,250 mg in sodium chloride 0.9 % 250 mL IVPB     1,250 mg 166.7 mL/hr over 90 Minutes Intravenous  Once 02/19/17 1919 02/19/17 2233   02/19/17 1900  Ampicillin-Sulbactam (UNASYN) 3 g in sodium chloride 0.9 % 100 mL IVPB  Status:  Discontinued     3 g 200 mL/hr over 30 Minutes Intravenous Every 6 hours 02/19/17 1841 02/22/17 1601   02/19/17 1845  vancomycin (VANCOCIN) IVPB 1000 mg/200 mL premix  Status:  Discontinued     1,000 mg 200 mL/hr over 60 Minutes Intravenous Every 24 hours 02/19/17 1841 02/19/17 1919      Assessment/Plan: s/p Procedure(s): IRRIGATION AND DEBRIDEMENT FOOT (Right) Assessment: Stable status post irrigation from postoperative infection.   Plan: Dressing left intact. We will reassess the wound tomorrow. Repeat CBC with differential for tomorrow as well. Discussed with the patient being discharged on IV antibiotics versus the possibility of needing to have the foot flushed out one more time depending on how it looks tomorrow.  LOS: 5 days  Durward Fortes 02/24/2017

## 2017-02-24 NOTE — Progress Notes (Signed)
[facility] PROGRESS NOTE Date of Admission:  02/19/2017     ID: BOSTEN NEWSTROM is a 56 y.o. male with foot infection Active Problems:   Foot infection   Subjective: No fevers, following with podiatry.   ROS  Eleven systems are reviewed and negative except per hpi  Medications:  Antibiotics Given (last 72 hours)    Date/Time Action Medication Dose Rate   02/21/17 1540 New Bag/Given   Ampicillin-Sulbactam (UNASYN) 3 g in sodium chloride 0.9 % 100 mL IVPB 3 g 200 mL/hr   02/21/17 2038 New Bag/Given   Ampicillin-Sulbactam (UNASYN) 3 g in sodium chloride 0.9 % 100 mL IVPB 3 g 200 mL/hr   02/21/17 2123 New Bag/Given   vancomycin (VANCOCIN) 1,500 mg in sodium chloride 0.9 % 500 mL IVPB 1,500 mg 250 mL/hr   02/22/17 0200 New Bag/Given   Ampicillin-Sulbactam (UNASYN) 3 g in sodium chloride 0.9 % 100 mL IVPB 3 g 200 mL/hr   02/22/17 0510 New Bag/Given   vancomycin (VANCOCIN) 1,500 mg in sodium chloride 0.9 % 500 mL IVPB 1,500 mg 250 mL/hr   02/22/17 1003 New Bag/Given   Ampicillin-Sulbactam (UNASYN) 3 g in sodium chloride 0.9 % 100 mL IVPB 3 g 200 mL/hr   02/22/17 1137 New Bag/Given   vancomycin (VANCOCIN) 1,500 mg in sodium chloride 0.9 % 500 mL IVPB 1,500 mg 250 mL/hr   02/22/17 1634 Given   levofloxacin (LEVAQUIN) tablet 750 mg 750 mg    02/23/17 1526 New Bag/Given   Ampicillin-Sulbactam (UNASYN) 3 g in sodium chloride 0.9 % 100 mL IVPB 3 g 200 mL/hr   02/23/17 2019 Given   ciprofloxacin (CIPRO) tablet 500 mg 500 mg    02/23/17 2332 New Bag/Given   Ampicillin-Sulbactam (UNASYN) 3 g in sodium chloride 0.9 % 100 mL IVPB 3 g 200 mL/hr   02/24/17 5784 New Bag/Given   Ampicillin-Sulbactam (UNASYN) 3 g in sodium chloride 0.9 % 100 mL IVPB 3 g 200 mL/hr   02/24/17 1004 New Bag/Given   Ampicillin-Sulbactam (UNASYN) 3 g in sodium chloride 0.9 % 100 mL IVPB 3 g 200 mL/hr   02/24/17 1009 Given   ciprofloxacin (CIPRO) tablet 500 mg 500 mg      . acidophilus  1  capsule Oral Daily  . ALPRAZolam  1 mg Oral TID  . amLODipine  5 mg Oral Daily  . aspirin EC  81 mg Oral Daily  . atorvastatin  40 mg Oral q1800  . busPIRone  30 mg Oral BID  . carvedilol  6.25 mg Oral BID WC  . ciprofloxacin  500 mg Oral BID  . docusate sodium  100 mg Oral BID  . DULoxetine  60 mg Oral Daily  . gabapentin  600 mg Oral TID  . heparin  5,000 Units Subcutaneous Q8H  . insulin aspart  0-15 Units Subcutaneous TID WC  . insulin aspart  0-5 Units Subcutaneous QHS  . levothyroxine  75 mcg Oral QAC breakfast  . morphine  15 mg Oral QHS  . morphine  30 mg Oral q morning - 10a  . multivitamin with minerals  1 tablet Oral Daily  . oxyCODONE  15 mg Oral 6 X Daily  . pantoprazole  40 mg Oral Daily  . polyethylene glycol  17 g Oral Daily  . senna-docusate  1 tablet Oral BID  . sildenafil  20 mg Oral TID  . sodium chloride flush  10-40 mL Intracatheter Q12H  . ticagrelor  90 mg Oral BID  .  zolpidem  10 mg Oral QHS    Objective: Vital signs in last 24 hours: Temp:  [97.6 F (36.4 C)-98.2 F (36.8 C)] 97.9 F (36.6 C) (11/07 0735) Pulse Rate:  [72-76] 72 (11/07 0735) Resp:  [18-20] 18 (11/06 1952) BP: (127-135)/(73-96) 134/82 (11/07 1004) SpO2:  [94 %-96 %] 96 % (11/07 0735) Weight:  [142.5 kg (314 lb 3.2 oz)] 142.5 kg (314 lb 3.2 oz) (11/07 0432) Constitutional: He is oriented to person, place, and time. obese HENT: anicteric Mouth/Throat: Oropharynx is clear and moist. No oropharyngeal exudate.  Cardiovascular: Normal rate, regular rhythm and normal heart sounds. Exam reveals no gallop and no friction rub.  No murmur heard.  Pulmonary/Chest: Effort normal and breath sounds normal. No respiratory distress. He has no wheezes.  Abdominal: Soft. Bowel sounds are normal. He exhibits no distension. There is no tenderness.  Lymphadenopathy:  He has no cervical adenopathy.  Neurological: He is alert and oriented to person, place, and time.  Skin: Skin is warm and dry.Foot  wrapped post op   Psychiatric: He has a normal mood and affect. His behavior is normal.     Lab Results Recent Labs    02/22/17 0449 02/24/17 0627  WBC 8.0  --   HGB 14.4  --   HCT 42.4  --   NA 137 139  K 3.9 3.9  CL 102 103  CO2 28 30  BUN 14 15  CREATININE 0.82 1.09    Microbiology: Results for orders placed or performed during the hospital encounter of 02/19/17  Aerobic/Anaerobic Culture (surgical/deep wound)     Status: None   Collection Time: 02/19/17  5:00 PM  Result Value Ref Range Status   Specimen Description WOUND  Final   Special Requests RIGHT FOOT  Final   Gram Stain   Final    RARE WBC PRESENT, PREDOMINANTLY PMN FEW GRAM NEGATIVE RODS FEW GRAM POSITIVE COCCI IN PAIRS    Culture   Final    MODERATE ENTEROBACTER CLOACAE FEW ENTEROCOCCUS FAECALIS NO ANAEROBES ISOLATED Performed at [facility], [place] [address], [place], [place] [address]    Report Status 02/24/2017 FINAL  Final   Organism ID, Bacteria ENTEROBACTER CLOACAE  Final   Organism ID, Bacteria ENTEROCOCCUS FAECALIS  Final      Susceptibility   Enterobacter cloacae - MIC*    CEFAZOLIN >=64 RESISTANT Resistant     CEFEPIME <=1 SENSITIVE Sensitive     CEFTAZIDIME <=1 SENSITIVE Sensitive     CEFTRIAXONE <=1 SENSITIVE Sensitive     CIPROFLOXACIN <=0.25 SENSITIVE Sensitive     GENTAMICIN <=1 SENSITIVE Sensitive     IMIPENEM <=0.25 SENSITIVE Sensitive     TRIMETH/SULFA <=20 SENSITIVE Sensitive     PIP/TAZO <=4 SENSITIVE Sensitive     * MODERATE ENTEROBACTER CLOACAE   Enterococcus faecalis - MIC*    AMPICILLIN <=2 SENSITIVE Sensitive     LEVOFLOXACIN 1 SENSITIVE Sensitive     VANCOMYCIN 1 SENSITIVE Sensitive     GENTAMICIN SYNERGY RESISTANT Resistant     * FEW ENTEROCOCCUS FAECALIS    Studies/Results: No results found.  Assessment/Plan: PIKE SCANTLEBURY is a 56 y.o. male with post op infection of R foot at site of previous bunion repair 10/24 which was then fractured and required  ORIF and plate placement 47/09. The site has now had a wash out and bead placement. WBC was 11, now down to 8. New dx DM but A1c only 6.5.  Cx with enterococcus, enterobacter.  ESR  46.    May need further debridement Recommendations  Will need  2 weeks IV abx to aggressively treat this infection given the presence of exposed hardware. See IV abx order sheet for unasyn and cipro dosing.  I can see in 2 weeks.  Thank you very much for the consult. Will follow with you.  Leonel Ramsay   02/24/2017, 2:41 PM
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
## 2017-02-24 NOTE — Consult Note (Signed)
PHARMACY CONSULT NOTE FOR:  OUTPATIENT  PARENTERAL ANTIBIOTIC THERAPY (OPAT)  Indication: foot infection Regimen: unasyn 12g continuous infusion  End date: 03/09/17  IV antibiotic discharge orders are pended. To discharging provider:  please sign these orders via discharge navigator,  Select New Orders & click on the button choice - Manage This Unsigned Work.     Thank you for allowing pharmacy to be a part of this patient's care.  Ramond Dial 02/24/2017, 8:54 AM

## 2017-02-25 ENCOUNTER — Other Ambulatory Visit: Payer: Self-pay

## 2017-02-25 LAB — CBC WITH DIFFERENTIAL/PLATELET
BASOS ABS: 0 10*3/uL (ref 0–0.1)
BASOS PCT: 1 %
EOS ABS: 0.3 10*3/uL (ref 0–0.7)
EOS PCT: 4 %
HCT: 43.4 % (ref 40.0–52.0)
Hemoglobin: 14.3 g/dL (ref 13.0–18.0)
Lymphocytes Relative: 26 %
Lymphs Abs: 1.9 10*3/uL (ref 1.0–3.6)
MCH: 30 pg (ref 26.0–34.0)
MCHC: 32.8 g/dL (ref 32.0–36.0)
MCV: 91.5 fL (ref 80.0–100.0)
MONO ABS: 0.5 10*3/uL (ref 0.2–1.0)
Monocytes Relative: 6 %
Neutro Abs: 4.6 10*3/uL (ref 1.4–6.5)
Neutrophils Relative %: 63 %
PLATELETS: 254 10*3/uL (ref 150–440)
RBC: 4.75 MIL/uL (ref 4.40–5.90)
RDW: 15.3 % — AB (ref 11.5–14.5)
WBC: 7.2 10*3/uL (ref 3.8–10.6)

## 2017-02-25 LAB — GLUCOSE, CAPILLARY
GLUCOSE-CAPILLARY: 142 mg/dL — AB (ref 65–99)
GLUCOSE-CAPILLARY: 156 mg/dL — AB (ref 65–99)
GLUCOSE-CAPILLARY: 120 mg/dL — AB (ref 65–99)
GLUCOSE-CAPILLARY: 219 mg/dL — AB (ref 65–99)

## 2017-02-25 MED ORDER — AMPICILLIN-SULBACTAM IV (FOR PTA / DISCHARGE USE ONLY): 12.0000 g | INTRAVENOUS | 0 refills | Status: DC

## 2017-02-25 MED ORDER — POLYETHYLENE GLYCOL 3350 17 G PO PACK: 17.0000 g | PACK | Freq: Every day | ORAL | 0 refills | Status: DC | PRN

## 2017-02-25 MED ORDER — CIPROFLOXACIN HCL 500 MG PO TABS: 500.0000 mg | ORAL_TABLET | Freq: Two times a day (BID) | ORAL | 0 refills | Status: DC
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
## 2017-02-25 NOTE — Progress Notes (Signed)
Daily Progress Note   Subjective  - 4 Days Post-Op  Right foot I&D status post right first MTPJ surgery.  He is doing fairly well.  Just some soreness.  Objective Vitals:   02/24/17 1004 02/24/17 1639 02/24/17 1951 02/25/17 0311  BP: 134/82 129/72 112/70   Pulse:  73 84   Resp:  16 19   Temp:   97.7 F (36.5 C)   TempSrc:   Oral   SpO2:  95% 92%   Weight:    (!) 146 kg (321 lb 14.4 oz)  Height:        Physical Exam: Bleeding on the bandage as expected.  No purulence today.  Distal aspect of the incision remains open at this time as expected.  Erythema continues to improve.  Laboratory CBC    Component Value Date/Time   WBC 7.2 02/25/2017 0359   HGB 14.3 02/25/2017 0359   HGB 11.7 (L) 01/27/2013 2020   HCT 43.4 02/25/2017 0359   HCT 37.7 (L) 01/27/2013 2020   PLT 254 02/25/2017 0359   PLT 355 01/27/2013 2020    BMET    Component Value Date/Time   NA 139 02/24/2017 0627   NA 140 01/27/2013 2020   K 3.9 02/24/2017 0627   K 3.7 01/27/2013 2020   CL 103 02/24/2017 0627   CL 107 01/27/2013 2020   CO2 30 02/24/2017 0627   CO2 26 01/27/2013 2020   GLUCOSE 146 (H) 02/24/2017 0627   GLUCOSE 85 01/27/2013 2020   BUN 15 02/24/2017 0627   BUN 16 01/27/2013 2020   CREATININE 1.09 02/24/2017 0627   CREATININE 0.98 01/27/2013 2020   CALCIUM 8.5 (L) 02/24/2017 0627   CALCIUM 8.6 01/27/2013 2020   GFRNONAA >60 02/24/2017 0627   GFRNONAA >60 01/27/2013 2020   GFRAA >60 02/24/2017 0627   GFRAA >60 01/27/2013 2020    Assessment/Planning: Postoperative infection right foot status post ORIF first metatarsal.   Antibiotics have been recommended by infectious disease including Unasyn and Cipro.  Appreciate ID input.  He was down IV antibiotics at least 2 weeks.  Dressing was changed today.  I recommend every other day dressing changes to the right foot.  This can be performed by home health.  Follow-up with me in 1 week.  Remain completely nonweightbearing to the right  foot.  Patient has a rolling knee scooter.  Dressing changes: Remove splint and dressing.  Flush wound with sterile saline.  Applied bulky dry dressing 3 times a week.  Samara Deist A  02/25/2017, 12:42 PM

## 2017-02-25 NOTE — Progress Notes (Signed)
Home health IV antibiotics cannot be set up until tomorrow. Patient will be discharged tomorrow. -Everything else is stable and no changes today
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
## 2017-02-25 NOTE — Progress Notes (Signed)
Offered CPAP for sleep to patient and he declined. Pt encouraged to wear CPAP and benefits of wearing CPAP discussed but patient continues to refuse. Patient states that he sleeps in a recliner at home and doesn't use a CPAP at home so it's no need to wear CPAP here at the hospital. Patient encouraged to call should he change his mind.

## 2017-02-25 NOTE — Discharge Instructions (Signed)
Dressing changes: Remove splint and dressing.  Flush wound with sterile saline.  Apply bulky dry dressing 3 times a week

## 2017-02-25 NOTE — Care Management (Signed)
Per MD patient will go home today. I have notified his wife and she is ready for him.  He will need a walker which has been requested from [facility].  Next IV dose at 1700 today. [facility] notified of this need and that patient is being discharged.

## 2017-02-25 NOTE — Care Management (Signed)
RNCM spoke with patient and wife regarding discharge planning needs. She/he was not aware that home health had been arranged or what agency however neither had choice of agency.  [facility] previously contacted for home health and IV infusion of antibiotics and they can accept patient. Both patient and wife agree.  He will need a rolling walker for assistance with non-weighbearing. I have requested this from [place] with [facility]. He has a scooter in the room- not sure if this will impact payer for walker.

## 2017-02-25 NOTE — Care Management (Signed)
RNCM has been notified by liaison with [facility] that patient is on continuous IV infusion and they will not be able to set up equipment today. I have paged MD to discuss. Patient is aware and will update his wife. Patient is concerned with discharge today. Patient agrees with discharge tomorrow however it could take up to 5 hours to coordinate IV equipment at his home per [place].

## 2017-02-25 NOTE — Progress Notes (Signed)
Inpatient Diabetes Program Recommendations  AACE/ADA: New Consensus Statement on Inpatient Glycemic Control (2015)  Target Ranges:  Prepandial:   less than 140 mg/dL      Peak postprandial:   less than 180 mg/dL (1-2 hours)      Critically ill patients:  140 - 180 mg/dL   Lab Results  Component Value Date   GLUCAP 156 (H) 02/25/2017   HGBA1C 6.5 (H) 02/22/2017    Met with patient at the bedside. After a short discussion, patient reveals he has had diabetes since the early 2000's and had gastric bypass when he was taking insulin, 40 units twice a day and Metformin at lunch. His blood sugars were than normal after surgery.  He has continued to check his blood sugars; in fact he has 3 meters (currently out of testing strips and does not know the names of the meters) and has been checking once a week.  Would like a referral to the Diabetes and Nutrition clinic since "I don't remember anything".  I discussed the importance of getting blood sugars under control to improve healing.  He tells me his father had a leg amputated and he needed a second one but was "too old to have it done".  Discussed the possibility of exercising at the [facility] where they have a machine that could accommodate his size and where he would be supervised- he was open to this.  Please order glucometer test strips at discharge.   Gentry Fitz, RN, BA, MHA, CDE Diabetes Coordinator Inpatient Diabetes Program  [phone] (Team Pager) [phone] ([place]) 02/25/2017 12:53 PM

## 2017-02-26 DIAGNOSIS — F419 Anxiety disorder, unspecified: Secondary | ICD-10-CM | POA: Diagnosis not present

## 2017-02-26 DIAGNOSIS — Z9884 Bariatric surgery status: Secondary | ICD-10-CM | POA: Diagnosis not present

## 2017-02-26 DIAGNOSIS — S99921A Unspecified injury of right foot, initial encounter: Secondary | ICD-10-CM | POA: Diagnosis not present

## 2017-02-26 DIAGNOSIS — L089 Local infection of the skin and subcutaneous tissue, unspecified: Secondary | ICD-10-CM | POA: Diagnosis not present

## 2017-02-26 DIAGNOSIS — T8140XA Infection following a procedure, unspecified, initial encounter: Secondary | ICD-10-CM | POA: Diagnosis not present

## 2017-02-26 DIAGNOSIS — R5381 Other malaise: Secondary | ICD-10-CM | POA: Diagnosis not present

## 2017-02-26 DIAGNOSIS — T8469XA Infection and inflammatory reaction due to internal fixation device of other site, initial encounter: Secondary | ICD-10-CM | POA: Diagnosis not present

## 2017-02-26 LAB — GLUCOSE, CAPILLARY: GLUCOSE-CAPILLARY: 122 mg/dL — AB (ref 65–99)
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
## 2017-02-26 NOTE — Care Management (Addendum)
Message sent to [facility] with [facility] to notify this RNCM when equipment has been set up in the home for IV transfusion. RNCM will update MD, patient and nurse ASAP for discharge to home this morning. [id number]: I spoke with [facility] and patient can leave any time this AM- they will be ready for him.  No other RNCM needs.

## 2017-02-26 NOTE — Progress Notes (Signed)
D/C instructions given to patient with teach back. Script given. Spouse here to transport patient home.

## 2017-03-01 DIAGNOSIS — L089 Local infection of the skin and subcutaneous tissue, unspecified: Secondary | ICD-10-CM | POA: Diagnosis not present

## 2017-03-01 DIAGNOSIS — F419 Anxiety disorder, unspecified: Secondary | ICD-10-CM | POA: Diagnosis not present

## 2017-03-01 DIAGNOSIS — R5381 Other malaise: Secondary | ICD-10-CM | POA: Diagnosis not present

## 2017-03-01 DIAGNOSIS — Z792 Long term (current) use of antibiotics: Secondary | ICD-10-CM | POA: Diagnosis not present

## 2017-03-01 DIAGNOSIS — S99921A Unspecified injury of right foot, initial encounter: Secondary | ICD-10-CM | POA: Diagnosis not present

## 2017-03-01 DIAGNOSIS — Z9884 Bariatric surgery status: Secondary | ICD-10-CM | POA: Diagnosis not present

## 2017-03-01 DIAGNOSIS — T8469XA Infection and inflammatory reaction due to internal fixation device of other site, initial encounter: Secondary | ICD-10-CM | POA: Diagnosis not present

## 2017-03-02 DIAGNOSIS — I10 Essential (primary) hypertension: Secondary | ICD-10-CM | POA: Diagnosis not present

## 2017-03-02 DIAGNOSIS — G4700 Insomnia, unspecified: Secondary | ICD-10-CM | POA: Diagnosis not present

## 2017-03-02 DIAGNOSIS — E1140 Type 2 diabetes mellitus with diabetic neuropathy, unspecified: Secondary | ICD-10-CM | POA: Diagnosis not present

## 2017-03-02 DIAGNOSIS — A499 Bacterial infection, unspecified: Secondary | ICD-10-CM | POA: Diagnosis not present

## 2017-03-02 DIAGNOSIS — E039 Hypothyroidism, unspecified: Secondary | ICD-10-CM | POA: Diagnosis not present

## 2017-03-03 DIAGNOSIS — Z5181 Encounter for therapeutic drug level monitoring: Secondary | ICD-10-CM | POA: Diagnosis not present

## 2017-03-03 DIAGNOSIS — I2510 Atherosclerotic heart disease of native coronary artery without angina pectoris: Secondary | ICD-10-CM | POA: Diagnosis not present

## 2017-03-03 DIAGNOSIS — T8469XA Infection and inflammatory reaction due to internal fixation device of other site, initial encounter: Secondary | ICD-10-CM | POA: Diagnosis not present

## 2017-03-03 DIAGNOSIS — M545 Low back pain: Secondary | ICD-10-CM | POA: Diagnosis not present

## 2017-03-03 DIAGNOSIS — B952 Enterococcus as the cause of diseases classified elsewhere: Secondary | ICD-10-CM | POA: Diagnosis not present

## 2017-03-03 DIAGNOSIS — Z9884 Bariatric surgery status: Secondary | ICD-10-CM | POA: Diagnosis not present

## 2017-03-03 DIAGNOSIS — F419 Anxiety disorder, unspecified: Secondary | ICD-10-CM | POA: Diagnosis not present

## 2017-03-03 DIAGNOSIS — F329 Major depressive disorder, single episode, unspecified: Secondary | ICD-10-CM | POA: Diagnosis not present

## 2017-03-03 DIAGNOSIS — K219 Gastro-esophageal reflux disease without esophagitis: Secondary | ICD-10-CM | POA: Diagnosis not present

## 2017-03-03 DIAGNOSIS — Z452 Encounter for adjustment and management of vascular access device: Secondary | ICD-10-CM | POA: Diagnosis not present

## 2017-03-03 DIAGNOSIS — G894 Chronic pain syndrome: Secondary | ICD-10-CM | POA: Diagnosis not present

## 2017-03-03 DIAGNOSIS — L03115 Cellulitis of right lower limb: Secondary | ICD-10-CM | POA: Diagnosis not present

## 2017-03-03 DIAGNOSIS — G4730 Sleep apnea, unspecified: Secondary | ICD-10-CM | POA: Diagnosis not present

## 2017-03-04 DIAGNOSIS — L97512 Non-pressure chronic ulcer of other part of right foot with fat layer exposed: Secondary | ICD-10-CM | POA: Diagnosis not present

## 2017-03-04 DIAGNOSIS — M2011 Hallux valgus (acquired), right foot: Secondary | ICD-10-CM | POA: Diagnosis not present

## 2017-03-05 DIAGNOSIS — F419 Anxiety disorder, unspecified: Secondary | ICD-10-CM | POA: Diagnosis not present

## 2017-03-05 DIAGNOSIS — Z9884 Bariatric surgery status: Secondary | ICD-10-CM | POA: Diagnosis not present

## 2017-03-05 DIAGNOSIS — T8469XA Infection and inflammatory reaction due to internal fixation device of other site, initial encounter: Secondary | ICD-10-CM | POA: Diagnosis not present

## 2017-03-06 DIAGNOSIS — S99921A Unspecified injury of right foot, initial encounter: Secondary | ICD-10-CM | POA: Diagnosis not present

## 2017-03-06 DIAGNOSIS — R5381 Other malaise: Secondary | ICD-10-CM | POA: Diagnosis not present

## 2017-03-06 DIAGNOSIS — L089 Local infection of the skin and subcutaneous tissue, unspecified: Secondary | ICD-10-CM | POA: Diagnosis not present

## 2017-03-08 DIAGNOSIS — T8469XA Infection and inflammatory reaction due to internal fixation device of other site, initial encounter: Secondary | ICD-10-CM | POA: Diagnosis not present

## 2017-03-08 DIAGNOSIS — R5381 Other malaise: Secondary | ICD-10-CM | POA: Diagnosis not present

## 2017-03-08 DIAGNOSIS — M86171 Other acute osteomyelitis, right ankle and foot: Secondary | ICD-10-CM | POA: Diagnosis not present

## 2017-03-08 DIAGNOSIS — T847XXD Infection and inflammatory reaction due to other internal orthopedic prosthetic devices, implants and grafts, subsequent encounter: Secondary | ICD-10-CM | POA: Diagnosis not present

## 2017-03-08 DIAGNOSIS — Z9884 Bariatric surgery status: Secondary | ICD-10-CM | POA: Diagnosis not present

## 2017-03-08 DIAGNOSIS — S99921A Unspecified injury of right foot, initial encounter: Secondary | ICD-10-CM | POA: Diagnosis not present

## 2017-03-08 DIAGNOSIS — F419 Anxiety disorder, unspecified: Secondary | ICD-10-CM | POA: Diagnosis not present

## 2017-03-08 DIAGNOSIS — Z792 Long term (current) use of antibiotics: Secondary | ICD-10-CM | POA: Diagnosis not present

## 2017-03-08 DIAGNOSIS — L089 Local infection of the skin and subcutaneous tissue, unspecified: Secondary | ICD-10-CM | POA: Diagnosis not present

## 2017-03-09 DIAGNOSIS — T847XXD Infection and inflammatory reaction due to other internal orthopedic prosthetic devices, implants and grafts, subsequent encounter: Secondary | ICD-10-CM | POA: Diagnosis not present

## 2017-03-09 DIAGNOSIS — Z792 Long term (current) use of antibiotics: Secondary | ICD-10-CM | POA: Diagnosis not present

## 2017-03-09 DIAGNOSIS — M86171 Other acute osteomyelitis, right ankle and foot: Secondary | ICD-10-CM | POA: Diagnosis not present

## 2017-03-10 DIAGNOSIS — E782 Mixed hyperlipidemia: Secondary | ICD-10-CM | POA: Diagnosis not present

## 2017-03-10 DIAGNOSIS — R5381 Other malaise: Secondary | ICD-10-CM | POA: Diagnosis not present

## 2017-03-10 DIAGNOSIS — L089 Local infection of the skin and subcutaneous tissue, unspecified: Secondary | ICD-10-CM | POA: Diagnosis not present

## 2017-03-10 DIAGNOSIS — G8929 Other chronic pain: Secondary | ICD-10-CM | POA: Diagnosis not present

## 2017-03-10 DIAGNOSIS — I10 Essential (primary) hypertension: Secondary | ICD-10-CM | POA: Diagnosis not present

## 2017-03-10 DIAGNOSIS — Z79891 Long term (current) use of opiate analgesic: Secondary | ICD-10-CM | POA: Diagnosis not present

## 2017-03-10 DIAGNOSIS — F1120 Opioid dependence, uncomplicated: Secondary | ICD-10-CM | POA: Diagnosis not present

## 2017-03-10 DIAGNOSIS — R269 Unspecified abnormalities of gait and mobility: Secondary | ICD-10-CM | POA: Diagnosis not present

## 2017-03-10 DIAGNOSIS — M25572 Pain in left ankle and joints of left foot: Secondary | ICD-10-CM | POA: Diagnosis not present

## 2017-03-10 DIAGNOSIS — T8189XA Other complications of procedures, not elsewhere classified, initial encounter: Secondary | ICD-10-CM | POA: Diagnosis not present

## 2017-03-10 DIAGNOSIS — M79604 Pain in right leg: Secondary | ICD-10-CM | POA: Diagnosis not present

## 2017-03-10 DIAGNOSIS — M545 Low back pain: Secondary | ICD-10-CM | POA: Diagnosis not present

## 2017-03-10 DIAGNOSIS — I2510 Atherosclerotic heart disease of native coronary artery without angina pectoris: Secondary | ICD-10-CM | POA: Diagnosis not present

## 2017-03-10 DIAGNOSIS — G894 Chronic pain syndrome: Secondary | ICD-10-CM | POA: Diagnosis not present

## 2017-03-10 DIAGNOSIS — M25512 Pain in left shoulder: Secondary | ICD-10-CM | POA: Diagnosis not present

## 2017-03-12 DIAGNOSIS — T8469XA Infection and inflammatory reaction due to internal fixation device of other site, initial encounter: Secondary | ICD-10-CM | POA: Diagnosis not present

## 2017-03-12 DIAGNOSIS — R269 Unspecified abnormalities of gait and mobility: Secondary | ICD-10-CM | POA: Diagnosis not present

## 2017-03-12 DIAGNOSIS — L089 Local infection of the skin and subcutaneous tissue, unspecified: Secondary | ICD-10-CM | POA: Diagnosis not present

## 2017-03-12 DIAGNOSIS — F419 Anxiety disorder, unspecified: Secondary | ICD-10-CM | POA: Diagnosis not present

## 2017-03-12 DIAGNOSIS — Z9884 Bariatric surgery status: Secondary | ICD-10-CM | POA: Diagnosis not present

## 2017-03-12 DIAGNOSIS — S99921A Unspecified injury of right foot, initial encounter: Secondary | ICD-10-CM | POA: Diagnosis not present

## 2017-03-12 DIAGNOSIS — T8189XA Other complications of procedures, not elsewhere classified, initial encounter: Secondary | ICD-10-CM | POA: Diagnosis not present

## 2017-03-15 DIAGNOSIS — F419 Anxiety disorder, unspecified: Secondary | ICD-10-CM | POA: Diagnosis not present

## 2017-03-15 DIAGNOSIS — T8469XA Infection and inflammatory reaction due to internal fixation device of other site, initial encounter: Secondary | ICD-10-CM | POA: Diagnosis not present

## 2017-03-15 DIAGNOSIS — Z9884 Bariatric surgery status: Secondary | ICD-10-CM | POA: Diagnosis not present

## 2017-03-16 DIAGNOSIS — M79671 Pain in right foot: Secondary | ICD-10-CM | POA: Diagnosis not present

## 2017-03-17 ENCOUNTER — Ambulatory Visit: Payer: BLUE CROSS/BLUE SHIELD | Admitting: Urology

## 2017-03-17 VITALS — BP 134/85 | HR 85 | Ht 74.0 in | Wt 322.1 lb

## 2017-03-17 DIAGNOSIS — R350 Frequency of micturition: Secondary | ICD-10-CM

## 2017-03-17 DIAGNOSIS — F419 Anxiety disorder, unspecified: Secondary | ICD-10-CM | POA: Diagnosis not present

## 2017-03-17 DIAGNOSIS — N401 Enlarged prostate with lower urinary tract symptoms: Secondary | ICD-10-CM | POA: Diagnosis not present

## 2017-03-17 DIAGNOSIS — T8469XA Infection and inflammatory reaction due to internal fixation device of other site, initial encounter: Secondary | ICD-10-CM | POA: Diagnosis not present

## 2017-03-17 DIAGNOSIS — E291 Testicular hypofunction: Secondary | ICD-10-CM | POA: Diagnosis not present

## 2017-03-17 DIAGNOSIS — Z9884 Bariatric surgery status: Secondary | ICD-10-CM | POA: Diagnosis not present

## 2017-03-17 LAB — BLADDER SCAN AMB NON-IMAGING: Scan Result: 0

## 2017-03-17 MED ORDER — TESTOSTERONE CYPIONATE 200 MG/ML IM SOLN: 140.0000 mg | INTRAMUSCULAR | 0 refills | Status: DC

## 2017-03-17 NOTE — Progress Notes (Signed)
03/17/2017 1:59 PM   Charles [name] 06/25/1960 [id number]  Referring provider: Lavera Guise, MD [address] [place], [place] [address]   Chief complaint: Follow-up  HPI: 56 year old male presents for follow-up of hypogonadism.  I last saw him at [facility] in June 2018.  He is injecting 140 mg testosterone cypionate every Wednesday.  He has good energy level and libido.  Denies breast tenderness/enlargement or lower extremity edema.  Last week he states he was having urinary frequency, urgency and had intermittent urinary stream however this has resolved.  Denies dysuria or gross hematuria.  Denies flank, abdominal, pelvic or scrotal pain.     PMH: Past Medical History:  Diagnosis Date  . Anginal pain ([place])   . Anxiety   . Arthritis   . Chronic back pain   . Coronary artery disease   . Depression   . GERD (gastroesophageal reflux disease)   . Hypertension   . Hypothyroidism   . Insomnia   . Low testosterone   . Myocardial infarction ([place])   . Peptic ulcer   . Shortness of breath   . Sleep apnea    has cpap-has not used since lost 140lb  . Spinal cord stimulator status    has a scs  . Wears glasses     Surgical History: Past Surgical History:  Procedure Laterality Date  . BACK SURGERY    . CARDIAC CATHETERIZATION N/A 06/27/2015   Procedure: Left Heart Cath and Coronary Angiography;  Surgeon: Dionisio David, MD;  Location: [facility];  Service: Cardiovascular;  Laterality: N/A;  . CARDIAC CATHETERIZATION N/A 06/27/2015   Procedure: Coronary Stent Intervention;  Surgeon: Yolonda Kida, MD;  Location: [facility];  Service: Cardiovascular;  Laterality: N/A;  . CLOSED REDUCTION NASAL FRACTURE  12/22/2011   Procedure: CLOSED REDUCTION NASAL FRACTURE;  Surgeon: Ascencion Dike, MD;  Location: [facility];  Service: ENT;  Laterality: N/A;  closed reduction of nasal fracture  . FACIAL FRACTURE SURGERY     face-upper jaw with dental implants  .  GASTRIC BYPASS  2011   has lost 140lb  . IRRIGATION AND DEBRIDEMENT FOOT Right 02/21/2017   Procedure: IRRIGATION AND DEBRIDEMENT FOOT;  Surgeon: Sharlotte Alamo, DPM;  Location: [facility];  Service: Podiatry;  Laterality: Right;  . LEFT HEART CATH AND CORONARY ANGIOGRAPHY N/A 06/25/2016   Procedure: Left Heart Cath and Coronary Angiography;  Surgeon: Corey Skains, MD;  Location: [facility];  Service: Cardiovascular;  Laterality: N/A;  . METATARSAL OSTEOTOMY Right 02/10/2017   Procedure: METATARSAL OSTEOTOMY-GREAT TOE AND 1ST METATARSAL;  Surgeon: Samara Deist, DPM;  Location: [facility];  Service: Podiatry;  Laterality: Right;  . ORIF TOE FRACTURE Right 02/17/2017   Procedure: Open reduction with internal fixation displaced osteotomy and fracture first metatarsal;  Surgeon: Samara Deist, DPM;  Location: [facility];  Service: Podiatry;  Laterality: Right;  IVA / POPLITEAL  . REPAIR TENDONS FOOT  2002   rt foot  . SPINAL CORD STIMULATOR IMPLANT  6/13    Home Medications:  Allergies as of 03/17/2017      Reactions   Lisinopril Cough      Medication List        Accurate as of 03/17/17  1:59 PM. Always use your most recent med list.          acidophilus Caps capsule Take 1 capsule by mouth daily.   albuterol 108 (90 Base) MCG/ACT inhaler  Commonly known as:  PROVENTIL HFA;VENTOLIN HFA Inhale 2 puffs into the lungs every 6 (six) hours as needed for wheezing or shortness of breath.   ALPRAZolam 1 MG tablet Commonly known as:  XANAX Take 1 mg by mouth 3 (three) times daily.   amLODipine 5 MG tablet Commonly known as:  NORVASC Take 5 mg by mouth daily.   ampicillin-sulbactam IVPB Commonly known as:  UNASYN Inject 12 g continuous into the vein. Indication:  Foot infection Last Day of Therapy:  11/20 Labs - Once weekly:  CBC/D and BMP, Labs - Every other week:  ESR and CRP   aspirin EC 81 MG tablet Take 1 tablet (81 mg total) by mouth daily.     atorvastatin 40 MG tablet Commonly known as:  LIPITOR Take 1 tablet (40 mg total) by mouth daily at 6 PM.   B-12 PO Take 1 tablet by mouth daily.   BRILINTA 90 MG Tabs tablet Generic drug:  ticagrelor Take by mouth 2 (two) times daily.   busPIRone 15 MG tablet Commonly known as:  BUSPAR Take 30 mg by mouth 2 (two) times daily.   carvedilol 6.25 MG tablet Commonly known as:  COREG Take 1 tablet (6.25 mg total) by mouth 2 (two) times daily with a meal.   ciprofloxacin 500 MG tablet Commonly known as:  CIPRO Take 1 tablet (500 mg total) 2 (two) times daily by mouth. X 2 weeks   DULoxetine 60 MG capsule Commonly known as:  CYMBALTA Take 60 mg by mouth daily.   Levomilnacipran HCl ER 40 MG Cp24 Take 40 mg by mouth daily.   levothyroxine 75 MCG tablet Commonly known as:  SYNTHROID, LEVOTHROID Take 75 mcg by mouth daily before breakfast.   LYRICA 75 MG capsule Generic drug:  pregabalin Take 75 mg by mouth 3 (three) times daily.   MEDI-PATCH-LIDOCAINE EX Apply topically.   morphine 15 MG 12 hr tablet Commonly known as:  MS CONTIN Take 15-30 mg by mouth every 12 (twelve) hours. Pt takes two tablets in the morning and one at night.   MOVANTIK 25 MG Tabs tablet Generic drug:  naloxegol oxalate Take 25 mg by mouth daily.   multivitamin with minerals Tabs tablet Take 1 tablet by mouth daily.   omeprazole 20 MG capsule Commonly known as:  PRILOSEC Take 20 mg by mouth daily.   ondansetron 8 MG tablet Commonly known as:  ZOFRAN Take 8 mg by mouth every 8 (eight) hours as needed for nausea or vomiting.   Oxycodone HCl 10 MG Tabs Take 10 mg by mouth 6 (six) times daily.   polyethylene glycol packet Commonly known as:  MIRALAX / GLYCOLAX Take 17 g daily as needed by mouth for mild constipation.   sildenafil 20 MG tablet Commonly known as:  REVATIO Take 20 mg by mouth 3 (three) times daily.   Testosterone Undecanoate 750 MG/3ML Soln Inject 750 mg into the muscle  once a week.   zolpidem 10 MG tablet Commonly known as:  AMBIEN Take 10 mg by mouth at bedtime.       Allergies:  Allergies  Allergen Reactions  . Lisinopril Cough    Family History: Family History  Problem Relation Age of Onset  . Diabetes Father     Social History:  reports that  has never smoked. he has never used smokeless tobacco. He reports that he does not drink alcohol or use drugs.  ROS: UROLOGY Frequent Urination?: Yes Hard to postpone urination?: Yes Burning/pain with  urination?: Yes Get up at night to urinate?: Yes Leakage of urine?: No Urine stream starts and stops?: Yes Trouble starting stream?: Yes Do you have to strain to urinate?: No Blood in urine?: No Urinary tract infection?: No Sexually transmitted disease?: No Injury to kidneys or bladder?: No Painful intercourse?: No Weak stream?: No Erection problems?: Yes Penile pain?: No  Gastrointestinal Nausea?: No Vomiting?: No Indigestion/heartburn?: No Diarrhea?: No Constipation?: No  Constitutional Fever: No Night sweats?: No Weight loss?: No Fatigue?: Yes  Skin Skin rash/lesions?: No Itching?: No  Eyes Blurred vision?: No Double vision?: No  Ears/Nose/Throat Sore throat?: No Sinus problems?: No  Hematologic/Lymphatic Swollen glands?: No Easy bruising?: No  Cardiovascular Leg swelling?: No Chest pain?: No  Respiratory Cough?: No Shortness of breath?: No  Endocrine Excessive thirst?: No  Musculoskeletal Back pain?: Yes Joint pain?: Yes  Neurological Headaches?: No Dizziness?: No  Psychologic Depression?: Yes Anxiety?: Yes  Physical Exam: BP 134/85   Pulse 85   Ht _0  (1.88 m)   Wt (!) 322 lb 1.6 oz (146.1 kg)   BMI 41.36 kg/m   Constitutional:  Alert and oriented, No acute distress. HEENT: [place] AT, moist mucus membranes.  Trachea midline, no masses. Cardiovascular: No clubbing, cyanosis, or edema. Respiratory: Normal respiratory effort, no increased  work of breathing. GI: Abdomen is soft, nontender, nondistended, no abdominal masses GU: No CVA tenderness.  Skin: No rashes, bruises or suspicious lesions. Lymph: No cervical or inguinal adenopathy. Neurologic: Grossly intact, no focal deficits, moving all 4 extremities. Psychiatric: Normal mood and affect.  Laboratory Data: Lab Results  Component Value Date   WBC 7.2 02/25/2017   HGB 14.3 02/25/2017   HCT 43.4 02/25/2017   MCV 91.5 02/25/2017   PLT 254 02/25/2017    Lab Results  Component Value Date   CREATININE 1.09 02/24/2017    Lab Results  Component Value Date   HGBA1C 6.5 (H) 02/22/2017    Pertinent Imaging: N/A  Assessment & Plan:   1. Hypogonadism in male Stable.  PSA performed in June remained low and stable.  Hematocrit performed earlier this month was 43%.  A testosterone level was drawn and he will be notified with the results.  Testosterone was refilled.  Follow-up 6 months.  - Testosterone  2. Benign prostatic hyperplasia with urinary frequency His voiding symptoms have resolved.  PVR by bladder scan was 4 mL.  He was instructed to call for worsening lower urinary tract symptoms. - Bladder Scan (Post Void Residual) in office    Abbie Sons, MD  [facility] [address] [place], [place] [address] [phone]

## 2017-03-18 ENCOUNTER — Telehealth: Payer: Self-pay

## 2017-03-18 LAB — TESTOSTERONE: TESTOSTERONE: 649 ng/dL (ref 264–916)

## 2017-03-18 NOTE — Telephone Encounter (Signed)
-----   Message from Abbie Sons, MD sent at 03/18/2017  9:55 AM EST ----- Testosterone level looks good at 649

## 2017-03-18 NOTE — Telephone Encounter (Signed)
Will send a letter

## 2017-03-19 DIAGNOSIS — F419 Anxiety disorder, unspecified: Secondary | ICD-10-CM | POA: Diagnosis not present

## 2017-03-19 DIAGNOSIS — Z9884 Bariatric surgery status: Secondary | ICD-10-CM | POA: Diagnosis not present

## 2017-03-19 DIAGNOSIS — T8469XA Infection and inflammatory reaction due to internal fixation device of other site, initial encounter: Secondary | ICD-10-CM | POA: Diagnosis not present

## 2017-03-21 DIAGNOSIS — E291 Testicular hypofunction: Secondary | ICD-10-CM | POA: Insufficient documentation

## 2017-03-22 DIAGNOSIS — T8469XA Infection and inflammatory reaction due to internal fixation device of other site, initial encounter: Secondary | ICD-10-CM | POA: Diagnosis not present

## 2017-03-22 DIAGNOSIS — F419 Anxiety disorder, unspecified: Secondary | ICD-10-CM | POA: Diagnosis not present

## 2017-03-22 DIAGNOSIS — Z9884 Bariatric surgery status: Secondary | ICD-10-CM | POA: Diagnosis not present

## 2017-03-24 DIAGNOSIS — T8469XA Infection and inflammatory reaction due to internal fixation device of other site, initial encounter: Secondary | ICD-10-CM | POA: Diagnosis not present

## 2017-03-24 DIAGNOSIS — Z9884 Bariatric surgery status: Secondary | ICD-10-CM | POA: Diagnosis not present

## 2017-03-24 DIAGNOSIS — F419 Anxiety disorder, unspecified: Secondary | ICD-10-CM | POA: Diagnosis not present

## 2017-03-26 DIAGNOSIS — T8469XA Infection and inflammatory reaction due to internal fixation device of other site, initial encounter: Secondary | ICD-10-CM | POA: Diagnosis not present

## 2017-03-26 DIAGNOSIS — Z9884 Bariatric surgery status: Secondary | ICD-10-CM | POA: Diagnosis not present

## 2017-03-26 DIAGNOSIS — F419 Anxiety disorder, unspecified: Secondary | ICD-10-CM | POA: Diagnosis not present

## 2017-03-30 DIAGNOSIS — M86171 Other acute osteomyelitis, right ankle and foot: Secondary | ICD-10-CM | POA: Diagnosis not present

## 2017-04-02 DIAGNOSIS — T8469XA Infection and inflammatory reaction due to internal fixation device of other site, initial encounter: Secondary | ICD-10-CM | POA: Diagnosis not present

## 2017-04-02 DIAGNOSIS — F419 Anxiety disorder, unspecified: Secondary | ICD-10-CM | POA: Diagnosis not present

## 2017-04-02 DIAGNOSIS — Z9884 Bariatric surgery status: Secondary | ICD-10-CM | POA: Diagnosis not present

## 2017-04-07 DIAGNOSIS — G8929 Other chronic pain: Secondary | ICD-10-CM | POA: Diagnosis not present

## 2017-04-07 DIAGNOSIS — G894 Chronic pain syndrome: Secondary | ICD-10-CM | POA: Diagnosis not present

## 2017-04-07 DIAGNOSIS — M25512 Pain in left shoulder: Secondary | ICD-10-CM | POA: Diagnosis not present

## 2017-04-07 DIAGNOSIS — M79605 Pain in left leg: Secondary | ICD-10-CM | POA: Diagnosis not present

## 2017-04-07 DIAGNOSIS — M545 Low back pain: Secondary | ICD-10-CM | POA: Diagnosis not present

## 2017-04-07 DIAGNOSIS — M79674 Pain in right toe(s): Secondary | ICD-10-CM | POA: Diagnosis not present

## 2017-04-07 DIAGNOSIS — Z79891 Long term (current) use of opiate analgesic: Secondary | ICD-10-CM | POA: Diagnosis not present

## 2017-04-07 DIAGNOSIS — F1120 Opioid dependence, uncomplicated: Secondary | ICD-10-CM | POA: Diagnosis not present

## 2017-04-07 DIAGNOSIS — M25562 Pain in left knee: Secondary | ICD-10-CM | POA: Diagnosis not present

## 2017-04-07 DIAGNOSIS — M542 Cervicalgia: Secondary | ICD-10-CM | POA: Diagnosis not present

## 2017-05-05 DIAGNOSIS — M542 Cervicalgia: Secondary | ICD-10-CM | POA: Diagnosis not present

## 2017-05-05 DIAGNOSIS — Z79891 Long term (current) use of opiate analgesic: Secondary | ICD-10-CM | POA: Diagnosis not present

## 2017-05-05 DIAGNOSIS — G8929 Other chronic pain: Secondary | ICD-10-CM | POA: Diagnosis not present

## 2017-05-05 DIAGNOSIS — M545 Low back pain: Secondary | ICD-10-CM | POA: Diagnosis not present

## 2017-05-05 DIAGNOSIS — M79605 Pain in left leg: Secondary | ICD-10-CM | POA: Diagnosis not present

## 2017-05-05 DIAGNOSIS — G894 Chronic pain syndrome: Secondary | ICD-10-CM | POA: Diagnosis not present

## 2017-05-05 DIAGNOSIS — F1120 Opioid dependence, uncomplicated: Secondary | ICD-10-CM | POA: Diagnosis not present

## 2017-05-11 DIAGNOSIS — M79671 Pain in right foot: Secondary | ICD-10-CM | POA: Diagnosis not present

## 2017-05-20 ENCOUNTER — Ambulatory Visit: Payer: BLUE CROSS/BLUE SHIELD | Admitting: Nurse Practitioner

## 2017-05-20 ENCOUNTER — Encounter: Payer: Self-pay | Admitting: Nurse Practitioner

## 2017-05-20 VITALS — BP 152/98 | HR 102 | Temp 97.8°F | Resp 16 | Ht 74.0 in | Wt 311.0 lb

## 2017-05-20 DIAGNOSIS — J019 Acute sinusitis, unspecified: Secondary | ICD-10-CM

## 2017-05-20 DIAGNOSIS — R059 Cough, unspecified: Secondary | ICD-10-CM

## 2017-05-20 DIAGNOSIS — M792 Neuralgia and neuritis, unspecified: Secondary | ICD-10-CM

## 2017-05-20 DIAGNOSIS — F5101 Primary insomnia: Secondary | ICD-10-CM | POA: Diagnosis not present

## 2017-05-20 DIAGNOSIS — R05 Cough: Secondary | ICD-10-CM

## 2017-05-20 MED ORDER — PREGABALIN 75 MG PO CAPS: 75.0000 mg | ORAL_CAPSULE | Freq: Three times a day (TID) | ORAL | 3 refills | Status: DC

## 2017-05-20 MED ORDER — ZOLPIDEM TARTRATE 10 MG PO TABS: 10.0000 mg | ORAL_TABLET | Freq: Every day | ORAL | 3 refills | Status: DC

## 2017-05-20 MED ORDER — PROMETHAZINE HCL 6.25 MG/5ML PO SOLN: 5.0000 mL | Freq: Two times a day (BID) | ORAL | 0 refills | Status: DC | PRN

## 2017-05-20 MED ORDER — SULFAMETHOXAZOLE-TRIMETHOPRIM 800-160 MG PO TABS: 1.0000 | ORAL_TABLET | Freq: Two times a day (BID) | ORAL | 0 refills | Status: DC

## 2017-05-20 NOTE — Progress Notes (Addendum)
[facility] [place], [place] [address]  Internal MEDICINE  Office Visit Note  Patient Name: Charles [name]  [id number]  [id number]  Date of Service: 05/20/2017  Chief Complaint  Patient presents with  . Sinusitis  . Cough  . Sore Throat     Sinusitis  This is a new problem. The current episode started in the past 7 days. The problem is unchanged. The pain is mild. Associated symptoms include chills, congestion, coughing, headaches, a hoarse voice, sinus pressure, sneezing and a sore throat. The treatment provided mild relief.  Cough  Associated symptoms include chills, headaches, postnasal drip, rhinorrhea, a sore throat and wheezing. Pertinent negatives include no fever. His past medical history is significant for environmental allergies.  Sore Throat   Associated symptoms include congestion, coughing, headaches and a hoarse voice. Pertinent negatives include no abdominal pain, diarrhea or vomiting.   Pt is here for a sick visit.     Current Medication:  Outpatient Encounter Medications as of 05/20/2017  Medication Sig Note  . albuterol (PROVENTIL HFA;VENTOLIN HFA) 108 (90 Base) MCG/ACT inhaler Inhale 2 puffs into the lungs every 6 (six) hours as needed for wheezing or shortness of breath.   . ALPRAZolam (XANAX) 1 MG tablet Take 1 mg by mouth 3 (three) times daily.    Marland Kitchen amLODipine (NORVASC) 5 MG tablet Take 5 mg by mouth daily.   Marland Kitchen aspirin EC 81 MG tablet Take 1 tablet (81 mg total) by mouth daily.   Marland Kitchen atorvastatin (LIPITOR) 40 MG tablet Take 1 tablet (40 mg total) by mouth daily at 6 PM. (Patient taking differently: Take 20 mg by mouth daily at 6 PM. )   . busPIRone (BUSPAR) 15 MG tablet Take 30 mg by mouth 2 (two) times daily.    . carvedilol (COREG) 6.25 MG tablet Take 1 tablet (6.25 mg total) by mouth 2 (two) times daily with a meal.   . Cyanocobalamin (B-12 PO) Take 1 tablet by mouth daily.   . DULoxetine (CYMBALTA) 60 MG capsule Take 60 mg  by mouth daily.   Marland Kitchen levothyroxine (SYNTHROID, LEVOTHROID) 75 MCG tablet Take 75 mcg by mouth daily before breakfast.    . morphine (MS CONTIN) 15 MG 12 hr tablet Take 15-30 mg by mouth every 12 (twelve) hours. Pt takes two tablets in the morning and one at night.   . Multiple Vitamin (MULTIVITAMIN WITH MINERALS) TABS tablet Take 1 tablet by mouth daily.   . naloxegol oxalate (MOVANTIK) 25 MG TABS tablet Take 25 mg by mouth daily.   Marland Kitchen omeprazole (PRILOSEC) 20 MG capsule Take 20 mg by mouth daily.    . ondansetron (ZOFRAN) 8 MG tablet Take 8 mg by mouth every 8 (eight) hours as needed for nausea or vomiting.   . Oxycodone HCl 10 MG TABS Take 10 mg by mouth 6 (six) times daily. 08/14/2015: 6 times daily  . polyethylene glycol (MIRALAX / GLYCOLAX) packet Take 17 g daily as needed by mouth for mild constipation.   . pregabalin (LYRICA) 75 MG capsule Take 1 capsule (75 mg total) by mouth 3 (three) times daily.   . sildenafil (REVATIO) 20 MG tablet Take 20 mg by mouth 3 (three) times daily.   Marland Kitchen testosterone cypionate (DEPOTESTOSTERONE CYPIONATE) 200 MG/ML injection Inject 0.7 mLs (140 mg total) into the muscle once a week.   . ticagrelor (BRILINTA) 90 MG TABS tablet Take by mouth 2 (two) times daily.   Marland Kitchen zolpidem (AMBIEN) 10 MG tablet  Take 1 tablet (10 mg total) by mouth at bedtime.   . [DISCONTINUED] pregabalin (LYRICA) 75 MG capsule Take 75 mg by mouth 3 (three) times daily.   . [DISCONTINUED] zolpidem (AMBIEN) 10 MG tablet Take 10 mg by mouth at bedtime.   Marland Kitchen acidophilus (RISAQUAD) CAPS capsule Take 1 capsule by mouth daily.   Marland Kitchen ampicillin-sulbactam (UNASYN) IVPB Inject 12 g continuous into the vein. Indication:  Foot infection Last Day of Therapy:  11/20 Labs - Once weekly:  CBC/D and BMP, Labs - Every other week:  ESR and CRP   . Levomilnacipran HCl ER 40 MG CP24 Take 40 mg by mouth daily.   . Lido-Capsaicin-Men-Methyl Sal (MEDI-PATCH-LIDOCAINE EX) Apply topically.   . Promethazine HCl 6.25 MG/5ML  SOLN Take 5 mLs (6.25 mg total) by mouth 2 (two) times daily as needed.   . sulfamethoxazole-trimethoprim (BACTRIM DS,SEPTRA DS) 800-160 MG tablet Take 1 tablet by mouth 2 (two) times daily.   . Testosterone Undecanoate 750 MG/3ML SOLN Inject 750 mg into the muscle once a week.   . [DISCONTINUED] ciprofloxacin (CIPRO) 500 MG tablet Take 1 tablet (500 mg total) 2 (two) times daily by mouth. X 2 weeks    No facility-administered encounter medications on file as of 05/20/2017.       Medical History: Past Medical History:  Diagnosis Date  . Anginal pain ([place])   . Anxiety   . Arthritis   . Chronic back pain   . Coronary artery disease   . Depression   . GERD (gastroesophageal reflux disease)   . Hypertension   . Hypothyroidism   . Insomnia   . Low testosterone   . Myocardial infarction ([place])   . Peptic ulcer   . Shortness of breath   . Sleep apnea    has cpap-has not used since lost 140lb  . Spinal cord stimulator status    has a scs  . Wears glasses      Vital Signs: BP (!) 152/98   Pulse (!) 102   Temp 97.8 F (36.6 C)   Resp 16   Ht '6\' 2"'$  (1.88 m)   Wt (!) 311 lb (141.1 kg)   SpO2 97%   BMI 39.93 kg/m    Review of Systems  Constitutional: Positive for appetite change, chills and fatigue. Negative for fever.  HENT: Positive for congestion, hoarse voice, postnasal drip, rhinorrhea, sinus pressure, sneezing and sore throat.   Eyes: Negative.   Respiratory: Positive for cough and wheezing.   Gastrointestinal: Negative for abdominal pain, diarrhea, nausea and vomiting.  Endocrine: Negative for cold intolerance, heat intolerance, polydipsia, polyphagia and polyuria.  Genitourinary: Negative.   Allergic/Immunologic: Positive for environmental allergies.  Neurological: Positive for headaches.  Hematological: Negative for adenopathy.  Psychiatric/Behavioral: Positive for sleep disturbance.    Physical Exam  Constitutional: He is oriented to person, place, and time.  He appears well-developed and well-nourished.  HENT:  Head: Normocephalic and atraumatic.  Right Ear: Tympanic membrane is erythematous and bulging.  Left Ear: Tympanic membrane is erythematous and bulging.  Nose: Rhinorrhea present. Right sinus exhibits frontal sinus tenderness. Left sinus exhibits frontal sinus tenderness.  Mouth/Throat: Posterior oropharyngeal edema present.  Eyes: Conjunctivae and EOM are normal. Pupils are equal, round, and reactive to light.  Neck: Normal range of motion. Neck supple.  Cardiovascular: Normal rate, regular rhythm and normal heart sounds.  Pulmonary/Chest: Effort normal. He has wheezes.  Congested, non-productive cough present  Abdominal: Soft. Bowel sounds are normal. There is no tenderness.  Musculoskeletal: Normal range of motion.  Lymphadenopathy:    He has cervical adenopathy.  Neurological: He is alert and oriented to person, place, and time.  Skin: Skin is warm and dry.  Psychiatric: He has a normal mood and affect. His behavior is normal. Judgment and thought content normal.   Assessment/Plan:  1. Acute sinusitis, recurrence not specified, unspecified location - sulfamethoxazole-trimethoprim (BACTRIM DS,SEPTRA DS) 800-160 MG tablet; Take 1 tablet by mouth 2 (two) times daily.  Dispense: 20 tablet; Refill: 0 OTC medication to help relieve symptoms. 2. Cough - Promethazine HCl 6.25 MG/5ML SOLN; Take 5 mLs (6.25 mg total) by mouth 2 (two) times daily as needed.  Dispense: 240 mL; Refill: 0 May also use Delsym OTC twice daily as needed for cough  3. Primary insomnia - zolpidem (AMBIEN) 10 MG tablet; Take 1 tablet (10 mg total) by mouth at bedtime.  Dispense: 30 tablet; Refill: 3  4. Neuralgia and neuritis - pregabalin (LYRICA) 75 MG capsule; Take 1 capsule (75 mg total) by mouth 3 (three) times daily.  Dispense: 90 capsule; Refill: 3  General Counseling: Charles [name] verbalizes understanding of the findings of todays visit and agrees with plan  of treatment. I have discussed any further diagnostic evaluation that may be needed or ordered today. We also reviewed his medications today. he has been encouraged to call the office with any questions or concerns that should arise related to todays visit.   This patient was seen by Leretha Pol, FNP- C in Collaboration with Dr Lavera Guise as a part of collaborative care agreement  Time spent: 15  Minutes

## 2017-06-04 ENCOUNTER — Other Ambulatory Visit (INDEPENDENT_AMBULATORY_CARE_PROVIDER_SITE_OTHER): Payer: Self-pay

## 2017-06-07 ENCOUNTER — Other Ambulatory Visit (INDEPENDENT_AMBULATORY_CARE_PROVIDER_SITE_OTHER): Payer: Self-pay

## 2017-06-07 ENCOUNTER — Ambulatory Visit: Payer: BLUE CROSS/BLUE SHIELD | Admitting: Nurse Practitioner

## 2017-06-07 ENCOUNTER — Telehealth: Payer: Self-pay

## 2017-06-07 ENCOUNTER — Encounter: Payer: Self-pay | Admitting: Nurse Practitioner

## 2017-06-07 VITALS — BP 150/99 | HR 85 | Resp 16 | Ht 74.0 in | Wt 316.0 lb

## 2017-06-07 DIAGNOSIS — E039 Hypothyroidism, unspecified: Secondary | ICD-10-CM

## 2017-06-07 DIAGNOSIS — F5101 Primary insomnia: Secondary | ICD-10-CM | POA: Diagnosis not present

## 2017-06-07 DIAGNOSIS — J4520 Mild intermittent asthma, uncomplicated: Secondary | ICD-10-CM

## 2017-06-07 DIAGNOSIS — L089 Local infection of the skin and subcutaneous tissue, unspecified: Secondary | ICD-10-CM

## 2017-06-07 DIAGNOSIS — I10 Essential (primary) hypertension: Secondary | ICD-10-CM | POA: Diagnosis not present

## 2017-06-07 MED ORDER — ALBUTEROL SULFATE HFA 108 (90 BASE) MCG/ACT IN AERS: 2.0000 | INHALATION_SPRAY | Freq: Four times a day (QID) | RESPIRATORY_TRACT | 0 refills | Status: DC | PRN

## 2017-06-07 NOTE — Progress Notes (Signed)
[facility] [place], [place] [address]  Internal MEDICINE  Office Visit Note  Patient Name: Charles [name]  [id number]  [id number]  Date of Service: 06/16/2017  Chief Complaint  Patient presents with  . Hypertension    The patient was treated for moderate URI at his most recent visit. He has completed his antibiotics. He reports resolution of infection today.    Hypertension  This is a chronic problem. The current episode started more than 1 year ago. The problem is unchanged. The problem is resistant. Associated symptoms include anxiety, headaches and malaise/fatigue. Pertinent negatives include no shortness of breath. Agents associated with hypertension include thyroid hormones and steroids. Risk factors for coronary artery disease include dyslipidemia, family history, obesity, sedentary lifestyle and stress. Past treatments include ACE inhibitors, beta blockers and diuretics. The current treatment provides moderate improvement. Compliance problems include exercise.  Hypertensive end-organ damage includes CAD/MI. Identifiable causes of hypertension include sleep apnea and a thyroid problem.    Pt is here for routine follow up.    Current Medication: Outpatient Encounter Medications as of 06/07/2017  Medication Sig Note  . ALPRAZolam (XANAX) 1 MG tablet Take 1 mg by mouth 3 (three) times daily.    Marland Kitchen amLODipine (NORVASC) 5 MG tablet Take 5 mg by mouth daily.   Marland Kitchen aspirin EC 81 MG tablet Take 1 tablet (81 mg total) by mouth daily.   Marland Kitchen atorvastatin (LIPITOR) 40 MG tablet Take 1 tablet (40 mg total) by mouth daily at 6 PM. (Patient taking differently: Take 20 mg by mouth daily at 6 PM. )   . busPIRone (BUSPAR) 15 MG tablet Take 30 mg by mouth 2 (two) times daily.    . carvedilol (COREG) 6.25 MG tablet Take 1 tablet (6.25 mg total) by mouth 2 (two) times daily with a meal.   . Cyanocobalamin (B-12 PO) Take 1 tablet by mouth daily.   . DULoxetine (CYMBALTA) 60  MG capsule Take 60 mg by mouth daily.   Marland Kitchen levothyroxine (SYNTHROID, LEVOTHROID) 75 MCG tablet Take 75 mcg by mouth daily before breakfast.    . Lido-Capsaicin-Men-Methyl Sal (MEDI-PATCH-LIDOCAINE EX) Apply topically.   Marland Kitchen morphine (MS CONTIN) 15 MG 12 hr tablet Take 15-30 mg by mouth every 12 (twelve) hours. Pt takes two tablets in the morning and one at night.   . Multiple Vitamin (MULTIVITAMIN WITH MINERALS) TABS tablet Take 1 tablet by mouth daily.   . naloxegol oxalate (MOVANTIK) 25 MG TABS tablet Take 25 mg by mouth daily.   Marland Kitchen omeprazole (PRILOSEC) 20 MG capsule Take 20 mg by mouth daily.    . ondansetron (ZOFRAN) 8 MG tablet Take 8 mg by mouth every 8 (eight) hours as needed for nausea or vomiting.   . Oxycodone HCl 10 MG TABS Take 10 mg by mouth 6 (six) times daily. 08/14/2015: 6 times daily  . polyethylene glycol (MIRALAX / GLYCOLAX) packet Take 17 g daily as needed by mouth for mild constipation.   . pregabalin (LYRICA) 75 MG capsule Take 1 capsule (75 mg total) by mouth 3 (three) times daily.   . sildenafil (REVATIO) 20 MG tablet Take 20 mg by mouth 3 (three) times daily.   . ticagrelor (BRILINTA) 90 MG TABS tablet Take by mouth 2 (two) times daily.   Marland Kitchen zolpidem (AMBIEN) 10 MG tablet Take 1 tablet (10 mg total) by mouth at bedtime.   . [DISCONTINUED] albuterol (PROVENTIL HFA;VENTOLIN HFA) 108 (90 Base) MCG/ACT inhaler Inhale 2 puffs into the  lungs every 6 (six) hours as needed for wheezing or shortness of breath.   . [DISCONTINUED] albuterol (PROVENTIL HFA;VENTOLIN HFA) 108 (90 Base) MCG/ACT inhaler Inhale 2 puffs into the lungs every 6 (six) hours as needed for wheezing or shortness of breath.   . [DISCONTINUED] testosterone cypionate (DEPOTESTOSTERONE CYPIONATE) 200 MG/ML injection Inject 0.7 mLs (140 mg total) into the muscle once a week.   Marland Kitchen acidophilus (RISAQUAD) CAPS capsule Take 1 capsule by mouth daily.   Marland Kitchen ampicillin-sulbactam (UNASYN) IVPB Inject 12 g continuous into the vein.  Indication:  Foot infection Last Day of Therapy:  11/20 Labs - Once weekly:  CBC/D and BMP, Labs - Every other week:  ESR and CRP (Patient not taking: Reported on 06/07/2017)   . Levomilnacipran HCl ER 40 MG CP24 Take 40 mg by mouth daily.   Marland Kitchen sulfamethoxazole-trimethoprim (BACTRIM DS,SEPTRA DS) 800-160 MG tablet Take 1 tablet by mouth 2 (two) times daily. (Patient not taking: Reported on 06/07/2017)   . Testosterone Undecanoate 750 MG/3ML SOLN Inject 750 mg into the muscle once a week.   . [DISCONTINUED] Promethazine HCl 6.25 MG/5ML SOLN Take 5 mLs (6.25 mg total) by mouth 2 (two) times daily as needed.    No facility-administered encounter medications on file as of 06/07/2017.     Surgical History: Past Surgical History:  Procedure Laterality Date  . BACK SURGERY    . CARDIAC CATHETERIZATION N/A 06/27/2015   Procedure: Left Heart Cath and Coronary Angiography;  Surgeon: Dionisio David, MD;  Location: [place] [facility];  Service: Cardiovascular;  Laterality: N/A;  . CARDIAC CATHETERIZATION N/A 06/27/2015   Procedure: Coronary Stent Intervention;  Surgeon: Yolonda Kida, MD;  Location: [place] [facility];  Service: Cardiovascular;  Laterality: N/A;  . CLOSED REDUCTION NASAL FRACTURE  12/22/2011   Procedure: CLOSED REDUCTION NASAL FRACTURE;  Surgeon: Ascencion Dike, MD;  Location: [place];  Service: ENT;  Laterality: N/A;  closed reduction of nasal fracture  . FACIAL FRACTURE SURGERY     face-upper jaw with dental implants  . GASTRIC BYPASS  2011   has lost 140lb  . IRRIGATION AND DEBRIDEMENT FOOT Right 02/21/2017   Procedure: IRRIGATION AND DEBRIDEMENT FOOT;  Surgeon: Sharlotte Alamo, DPM;  Location: [facility];  Service: Podiatry;  Laterality: Right;  . LEFT HEART CATH AND CORONARY ANGIOGRAPHY N/A 06/25/2016   Procedure: Left Heart Cath and Coronary Angiography;  Surgeon: Corey Skains, MD;  Location: [place] [facility];  Service: Cardiovascular;  Laterality: N/A;  .  METATARSAL OSTEOTOMY Right 02/10/2017   Procedure: METATARSAL OSTEOTOMY-GREAT TOE AND 1ST METATARSAL;  Surgeon: Samara Deist, DPM;  Location: [place];  Service: Podiatry;  Laterality: Right;  . ORIF TOE FRACTURE Right 02/17/2017   Procedure: Open reduction with internal fixation displaced osteotomy and fracture first metatarsal;  Surgeon: Samara Deist, DPM;  Location: [place];  Service: Podiatry;  Laterality: Right;  IVA / POPLITEAL  . REPAIR TENDONS FOOT  2002   rt foot  . SPINAL CORD STIMULATOR IMPLANT  6/13    Medical History: Past Medical History:  Diagnosis Date  . Anginal pain ([place])   . Anxiety   . Arthritis   . Chronic back pain   . Coronary artery disease   . Depression   . GERD (gastroesophageal reflux disease)   . Hypertension   . Hypothyroidism   . Insomnia   . Low testosterone   . Myocardial infarction ([place])   . Peptic ulcer   .  Shortness of breath   . Sleep apnea    has cpap-has not used since lost 140lb  . Spinal cord stimulator status    has a scs  . Wears glasses     Family History: Family History  Problem Relation Age of Onset  . Diabetes Father     Social History   Socioeconomic History  . Marital status: Married    Spouse name: Not on file  . Number of children: Not on file  . Years of education: Not on file  . Highest education level: Not on file  Social Needs  . Financial resource strain: Not on file  . Food insecurity - worry: Not on file  . Food insecurity - inability: Not on file  . Transportation needs - medical: Not on file  . Transportation needs - non-medical: Not on file  Occupational History  . Not on file  Tobacco Use  . Smoking status: Never Smoker  . Smokeless tobacco: Never Used  Substance and Sexual Activity  . Alcohol use: No    Alcohol/week: 0.0 oz    Comment: not in 4 yr  . Drug use: No  . Sexual activity: No  Other Topics Concern  . Not on file  Social History Narrative  . Not on file       Review of Systems  Constitutional: Positive for malaise/fatigue. Negative for appetite change, chills, fatigue and fever.  HENT: Positive for postnasal drip. Negative for congestion, rhinorrhea, sinus pressure, sneezing and sore throat.   Eyes: Negative.   Respiratory: Positive for cough. Negative for shortness of breath and wheezing.   Cardiovascular:       Bp slightly elevated today  Gastrointestinal: Positive for constipation. Negative for abdominal pain, diarrhea, nausea and vomiting.  Endocrine: Negative for cold intolerance, heat intolerance, polydipsia, polyphagia and polyuria.  Genitourinary: Negative.   Musculoskeletal: Positive for arthralgias and back pain.       Chronic back pain and joint issues. Sees pain management regularly.  Skin: Negative for rash.  Allergic/Immunologic: Positive for environmental allergies.  Neurological: Positive for headaches.  Hematological: Negative for adenopathy.  Psychiatric/Behavioral: Positive for sleep disturbance.    Today's Vitals   06/07/17 1224  BP: (!) 150/99  Pulse: 85  Resp: 16  SpO2: 97%  Weight: (!) 316 lb (143.3 kg)  Height: _0  (1.88 m)    Physical Exam  Constitutional: He is oriented to person, place, and time. He appears well-developed and well-nourished.  HENT:  Head: Normocephalic and atraumatic.  Right Ear: Tympanic membrane is erythematous and bulging.  Left Ear: Tympanic membrane is erythematous and bulging.  Nose: Rhinorrhea present. Right sinus exhibits frontal sinus tenderness. Left sinus exhibits frontal sinus tenderness.  Mouth/Throat: Posterior oropharyngeal edema present.  Eyes: Conjunctivae and EOM are normal. Pupils are equal, round, and reactive to light.  Neck: Normal range of motion. Neck supple. No thyromegaly present.  Cardiovascular: Normal rate, regular rhythm and normal heart sounds.  Pulmonary/Chest: Effort normal and breath sounds normal. He has no wheezes.  Congested,  non-productive cough present  Abdominal: Soft. Bowel sounds are normal. There is no tenderness.  Musculoskeletal: Normal range of motion.  Lymphadenopathy:    He has no cervical adenopathy.  Neurological: He is alert and oriented to person, place, and time.  Skin: Skin is warm and dry.  Psychiatric: He has a normal mood and affect. His behavior is normal. Judgment and thought content normal.  Nursing note and vitals reviewed.  Assessment/Plan: 1. Mild intermittent  asthma without complication Doing well. Continue inhalers and respiratory medications as prescribed   2. Essential hypertension Generally well controlled. Continue bp medication as prescribed.  3. Acquired hypothyroidism Stable. Continue levothyroxine as prescribed.   4. Primary insomnia May conitnue zolpidem as needed and as prescribed   5. Foot infection Continue regular visits with podiatry for management.   General Counseling: alandis bluemel understanding of the findings of todays visit and agrees with plan of treatment. I have discussed any further diagnostic evaluation that may be needed or ordered today. We also reviewed his medications today. he has been encouraged to call the office with any questions or concerns that should arise related to todays visit.   This patient was seen by Leretha Pol, FNP- C in Collaboration with Dr Lavera Guise as a part of collaborative care agreement   Meds ordered this encounter  Medications  . DISCONTD: albuterol (PROVENTIL HFA;VENTOLIN HFA) 108 (90 Base) MCG/ACT inhaler    Sig: Inhale 2 puffs into the lungs every 6 (six) hours as needed for wheezing or shortness of breath.    Dispense:  1 Inhaler    Refill:  0    Order Specific Question:   Supervising Provider    Answer:   Lavera Guise [[id number]]    Time spent: 63 Minutes   Dr Lavera Guise Internal medicine

## 2017-06-07 NOTE — Telephone Encounter (Signed)
[facility], called stating pt transferred rx to them after [facility]. Charles [name] stated that [facility] will not transfer controlled substance scripts therefore they are needing a new script. Gave v.o. For testosterone cypionate.

## 2017-06-08 ENCOUNTER — Other Ambulatory Visit: Payer: Self-pay | Admitting: Nurse Practitioner

## 2017-06-08 ENCOUNTER — Other Ambulatory Visit (INDEPENDENT_AMBULATORY_CARE_PROVIDER_SITE_OTHER): Payer: Self-pay

## 2017-06-08 DIAGNOSIS — J4520 Mild intermittent asthma, uncomplicated: Secondary | ICD-10-CM

## 2017-06-08 MED ORDER — ALBUTEROL SULFATE HFA 108 (90 BASE) MCG/ACT IN AERS
2.0000 | INHALATION_SPRAY | Freq: Four times a day (QID) | RESPIRATORY_TRACT | 0 refills | Status: DC | PRN
Start: 2017-06-08 — End: 2017-08-06

## 2017-06-09 MED ORDER — TESTOSTERONE CYPIONATE 200 MG/ML IM SOLN: 140.0000 mg | INTRAMUSCULAR | 0 refills | Status: DC

## 2017-06-16 DIAGNOSIS — G4700 Insomnia, unspecified: Secondary | ICD-10-CM | POA: Insufficient documentation

## 2017-06-16 DIAGNOSIS — J4520 Mild intermittent asthma, uncomplicated: Secondary | ICD-10-CM | POA: Insufficient documentation

## 2017-07-20 ENCOUNTER — Ambulatory Visit: Payer: BLUE CROSS/BLUE SHIELD | Admitting: Nurse Practitioner

## 2017-07-20 ENCOUNTER — Encounter: Payer: Self-pay | Admitting: Nurse Practitioner

## 2017-07-20 VITALS — BP 163/101 | HR 76 | Temp 96.6°F | Resp 16 | Ht 74.0 in | Wt 315.8 lb

## 2017-07-20 DIAGNOSIS — J4520 Mild intermittent asthma, uncomplicated: Secondary | ICD-10-CM | POA: Diagnosis not present

## 2017-07-20 DIAGNOSIS — J029 Acute pharyngitis, unspecified: Secondary | ICD-10-CM | POA: Diagnosis not present

## 2017-07-20 DIAGNOSIS — J069 Acute upper respiratory infection, unspecified: Secondary | ICD-10-CM

## 2017-07-20 DIAGNOSIS — I10 Essential (primary) hypertension: Secondary | ICD-10-CM

## 2017-07-20 MED ORDER — FIRST-DUKES MOUTHWASH MT SUSP: OROMUCOSAL | 0 refills | Status: DC

## 2017-07-20 MED ORDER — AMOXICILLIN-POT CLAVULANATE 875-125 MG PO TABS: 1.0000 | ORAL_TABLET | Freq: Two times a day (BID) | ORAL | 0 refills | Status: DC

## 2017-07-20 NOTE — Progress Notes (Signed)
[facility] [place],  [address]  Internal MEDICINE  Office Visit Note  Patient Name: Charles [name]  [id number]  [id number]  Date of Service: 07/21/2017  Chief Complaint  Patient presents with  . Respiratory Distress  . Generalized Body Aches  . Fatigue  . URI    going on for about 2 1/2 weeks and some drainage     URI   This is a recurrent problem. The current episode started in the past 7 days. The problem has been gradually worsening. There has been no fever. Associated symptoms include congestion, coughing, ear pain, headaches, nausea, a plugged ear sensation, rhinorrhea, sinus pain, sneezing, a sore throat and swollen glands. Pertinent negatives include no rash or vomiting. He has tried acetaminophen for the symptoms. The treatment provided no relief.   Pt is here for a sick visit.     Current Medication:  Outpatient Encounter Medications as of 07/20/2017  Medication Sig Note  . acidophilus (RISAQUAD) CAPS capsule Take 1 capsule by mouth daily.   Marland Kitchen albuterol (PROVENTIL HFA;VENTOLIN HFA) 108 (90 Base) MCG/ACT inhaler Inhale 2 puffs into the lungs every 6 (six) hours as needed for wheezing or shortness of breath.   . ALPRAZolam (XANAX) 1 MG tablet Take 1 mg by mouth 3 (three) times daily.    Marland Kitchen amLODipine (NORVASC) 5 MG tablet Take 5 mg by mouth daily.   Marland Kitchen amoxicillin-clavulanate (AUGMENTIN) 875-125 MG tablet Take 1 tablet by mouth 2 (two) times daily.   Marland Kitchen ampicillin-sulbactam (UNASYN) IVPB Inject 12 g continuous into the vein. Indication:  Foot infection Last Day of Therapy:  11/20 Labs - Once weekly:  CBC/D and BMP, Labs - Every other week:  ESR and CRP (Patient not taking: Reported on 06/07/2017)   . aspirin EC 81 MG tablet Take 1 tablet (81 mg total) by mouth daily.   Marland Kitchen atorvastatin (LIPITOR) 40 MG tablet Take 1 tablet (40 mg total) by mouth daily at 6 PM. (Patient taking differently: Take 20 mg by mouth daily at 6 PM. )   . busPIRone  (BUSPAR) 15 MG tablet Take 30 mg by mouth 2 (two) times daily.    . carvedilol (COREG) 6.25 MG tablet Take 1 tablet (6.25 mg total) by mouth 2 (two) times daily with a meal.   . Cyanocobalamin (B-12 PO) Take 1 tablet by mouth daily.   . Diphenhyd-Hydrocort-Nystatin (FIRST-DUKES MOUTHWASH) SUSP Swish and swallow with 36ms QID prn sore throat   . DULoxetine (CYMBALTA) 60 MG capsule Take 60 mg by mouth daily.   . Levomilnacipran HCl ER 40 MG CP24 Take 40 mg by mouth daily.   .Marland Kitchenlevothyroxine (SYNTHROID, LEVOTHROID) 75 MCG tablet Take 75 mcg by mouth daily before breakfast.    . Lido-Capsaicin-Men-Methyl Sal (MEDI-PATCH-LIDOCAINE EX) Apply topically.   .Marland Kitchenmorphine (MS CONTIN) 15 MG 12 hr tablet Take 15-30 mg by mouth every 12 (twelve) hours. Pt takes two tablets in the morning and one at night.   . Multiple Vitamin (MULTIVITAMIN WITH MINERALS) TABS tablet Take 1 tablet by mouth daily.   . naloxegol oxalate (MOVANTIK) 25 MG TABS tablet Take 25 mg by mouth daily.   .Marland Kitchenomeprazole (PRILOSEC) 20 MG capsule Take 20 mg by mouth daily.    . ondansetron (ZOFRAN) 8 MG tablet Take 8 mg by mouth every 8 (eight) hours as needed for nausea or vomiting.   . Oxycodone HCl 10 MG TABS Take 10 mg by mouth 6 (six) times daily. 08/14/2015: 6  times daily  . polyethylene glycol (MIRALAX / GLYCOLAX) packet Take 17 g daily as needed by mouth for mild constipation.   . pregabalin (LYRICA) 75 MG capsule Take 1 capsule (75 mg total) by mouth 3 (three) times daily.   . sildenafil (REVATIO) 20 MG tablet Take 20 mg by mouth 3 (three) times daily.   Marland Kitchen sulfamethoxazole-trimethoprim (BACTRIM DS,SEPTRA DS) 800-160 MG tablet Take 1 tablet by mouth 2 (two) times daily. (Patient not taking: Reported on 06/07/2017)   . testosterone cypionate (DEPOTESTOSTERONE CYPIONATE) 200 MG/ML injection Inject 0.7 mLs (140 mg total) into the muscle once a week.   . testosterone cypionate (DEPOTESTOSTERONE CYPIONATE) 200 MG/ML injection Inject 0.7 mLs (140 mg  total) into the muscle once a week.   . Testosterone Undecanoate 750 MG/3ML SOLN Inject 750 mg into the muscle once a week.   . ticagrelor (BRILINTA) 90 MG TABS tablet Take by mouth 2 (two) times daily.   Marland Kitchen zolpidem (AMBIEN) 10 MG tablet Take 1 tablet (10 mg total) by mouth at bedtime.    No facility-administered encounter medications on file as of 07/20/2017.       Medical History: Past Medical History:  Diagnosis Date  . Anginal pain ([place])   . Anxiety   . Arthritis   . Chronic back pain   . Coronary artery disease   . Depression   . GERD (gastroesophageal reflux disease)   . Hypertension   . Hypothyroidism   . Insomnia   . Low testosterone   . Myocardial infarction ([place])   . Peptic ulcer   . Shortness of breath   . Sleep apnea    has cpap-has not used since lost 140lb  . Spinal cord stimulator status    has a scs  . Wears glasses      Today's Vitals   07/20/17 1029  BP: (!) 163/101  Pulse: 76  Resp: 16  Temp: (!) 96.6 F (35.9 C)  SpO2: 98%  Weight: (!) 315 lb 12.8 oz (143.2 kg)  Height: _0  (1.88 m)    Review of Systems  Constitutional: Positive for chills and fatigue. Negative for fever.  HENT: Positive for congestion, ear pain, rhinorrhea, sinus pain, sneezing, sore throat and voice change.   Eyes: Negative.   Respiratory: Positive for cough.   Gastrointestinal: Positive for nausea. Negative for vomiting.  Endocrine: Negative for cold intolerance, heat intolerance, polydipsia, polyphagia and polyuria.  Genitourinary: Negative.   Musculoskeletal: Positive for arthralgias and back pain.  Skin: Negative for rash.  Allergic/Immunologic: Positive for environmental allergies.  Neurological: Positive for dizziness and headaches.  Psychiatric/Behavioral: Negative.     Physical Exam  Constitutional: He is oriented to person, place, and time. He appears well-developed and well-nourished.  HENT:  Head: Normocephalic and atraumatic.  Right Ear: Tympanic  membrane is erythematous and bulging.  Left Ear: Tympanic membrane is erythematous and bulging.  Nose: Rhinorrhea present. Right sinus exhibits frontal sinus tenderness. Left sinus exhibits frontal sinus tenderness.  Mouth/Throat: Posterior oropharyngeal edema present.  Eyes: Pupils are equal, round, and reactive to light. Conjunctivae and EOM are normal.  Neck: Normal range of motion. Neck supple.  Cardiovascular: Normal rate, regular rhythm and normal heart sounds.  Pulmonary/Chest: Effort normal. He has wheezes.  Congested, non-productive cough present  Abdominal: Soft. Bowel sounds are normal. There is no tenderness.  Musculoskeletal: Normal range of motion.  Lymphadenopathy:    He has cervical adenopathy.  Neurological: He is alert and oriented to person, place, and time.  Skin: Skin is warm and dry.  Psychiatric: He has a normal mood and affect. His behavior is normal. Judgment and thought content normal.  Nursing note and vitals reviewed.  Assessment/Plan: 1. Sore throat - Diphenhyd-Hydrocort-Nystatin (FIRST-DUKES MOUTHWASH) SUSP; Swish and swallow with 66ms QID prn sore throat  Dispense: 200 mL; Refill: 0  2. Acute upper respiratory infection - amoxicillin-clavulanate (AUGMENTIN) 875-125 MG tablet; Take 1 tablet by mouth 2 (two) times daily.  Dispense: 20 tablet; Refill: 0  3. Essential hypertension Generally stable. Continue bp medication as prescribed. Reassess at next visit.   4. Mild intermittent asthma without complication Use inhalers and respiratory medications as prescribed   General Counseling: JSioneverbalizes understanding of the findings of todays visit and agrees with plan of treatment. I have discussed any further diagnostic evaluation that may be needed or ordered today. We also reviewed his medications today. he has been encouraged to call the office with any questions or concerns that should arise related to todays visit.   This patient was seen by HLeretha Pol FNP- C in Collaboration with Dr FLavera Guiseas a part of collaborative care agreement   Meds ordered this encounter  Medications  . amoxicillin-clavulanate (AUGMENTIN) 875-125 MG tablet    Sig: Take 1 tablet by mouth 2 (two) times daily.    Dispense:  20 tablet    Refill:  0    Order Specific Question:   Supervising Provider    Answer:   KLavera Guise[[[id number]] . Diphenhyd-Hydrocort-Nystatin (FIRST-DUKES MOUTHWASH) SUSP    Sig: Swish and swallow with 536m QID prn sore throat    Dispense:  200 mL    Refill:  0    Order Specific Question:   Supervising Provider    Answer:   KHLavera Guise1[[id number]]  Time spent: 15 Minutes

## 2017-07-21 DIAGNOSIS — J069 Acute upper respiratory infection, unspecified: Secondary | ICD-10-CM | POA: Insufficient documentation

## 2017-07-21 DIAGNOSIS — J029 Acute pharyngitis, unspecified: Secondary | ICD-10-CM | POA: Insufficient documentation

## 2017-08-02 ENCOUNTER — Other Ambulatory Visit: Payer: Self-pay | Admitting: Urology

## 2017-08-03 NOTE — Telephone Encounter (Signed)
Seems early. Please review and advise.

## 2017-08-04 NOTE — Telephone Encounter (Signed)
It looks like he has an appointment tomorrow.  Will send an Rx at that time as he will need a trough testosterone level.

## 2017-08-05 ENCOUNTER — Encounter: Payer: Self-pay | Admitting: Urology

## 2017-08-05 ENCOUNTER — Ambulatory Visit: Payer: BLUE CROSS/BLUE SHIELD | Admitting: Urology

## 2017-08-05 VITALS — BP 166/102 | HR 93 | Ht 73.0 in | Wt 295.0 lb

## 2017-08-05 DIAGNOSIS — N138 Other obstructive and reflux uropathy: Secondary | ICD-10-CM

## 2017-08-05 DIAGNOSIS — N401 Enlarged prostate with lower urinary tract symptoms: Secondary | ICD-10-CM | POA: Diagnosis not present

## 2017-08-05 DIAGNOSIS — R3912 Poor urinary stream: Secondary | ICD-10-CM

## 2017-08-05 LAB — MICROSCOPIC EXAMINATION
EPITHELIAL CELLS (NON RENAL): NONE SEEN /HPF (ref 0–10)
RBC MICROSCOPIC, UA: NONE SEEN /HPF (ref 0–2)
WBC UA: NONE SEEN /HPF (ref 0–5)

## 2017-08-05 LAB — BLADDER SCAN AMB NON-IMAGING

## 2017-08-05 LAB — URINALYSIS, COMPLETE
Bilirubin, UA: NEGATIVE
Glucose, UA: NEGATIVE
Ketones, UA: NEGATIVE
Leukocytes, UA: NEGATIVE
Nitrite, UA: NEGATIVE
RBC, UA: NEGATIVE
Specific Gravity, UA: >1.03 — ABNORMAL HIGH (ref 1.005–1.030)
Urobilinogen, Ur: 0.2 mg/dL (ref 0.2–1.0)
pH, UA: 5 (ref 5.0–7.5)

## 2017-08-05 MED ORDER — TESTOSTERONE CYPIONATE 200 MG/ML IM SOLN: 140.0000 mg | INTRAMUSCULAR | 0 refills | Status: DC

## 2017-08-05 MED ORDER — TAMSULOSIN HCL 0.4 MG PO CAPS: 0.4000 mg | ORAL_CAPSULE | Freq: Every day | ORAL | 2 refills | Status: DC

## 2017-08-05 NOTE — Progress Notes (Signed)
08/05/2017 2:54 PM   Charles [name] November 16, 1960 [id number]  Referring provider: Lavera Guise, MD [address] [place], [place] [address]  Chief Complaint  Patient presents with  . weak stream    HPI: 57 year old male presents with bothersome lower urinary tract symptoms for the past 2 months.  He complains of a weak urinary stream, straining to urinate, urgency, intermittency, frequency and sensation of incomplete emptying.  He has nocturia x4.  IPSS is 28/35 with a quality of life rated 5/6.  He denies dysuria or gross hematuria.  Denies flank, abdominal, pelvic or scrotal pain.  He is followed for hypogonadism and is on testosterone replacement therapy.  His last injection was 2 days ago.  He is due for monitoring blood work next month.   PMH: Past Medical History:  Diagnosis Date  . Anginal pain ([place])   . Anxiety   . Arthritis   . Chronic back pain   . Coronary artery disease   . Depression   . GERD (gastroesophageal reflux disease)   . Hypertension   . Hypothyroidism   . Insomnia   . Low testosterone   . Myocardial infarction ([place])   . Peptic ulcer   . Shortness of breath   . Sleep apnea    has cpap-has not used since lost 140lb  . Spinal cord stimulator status    has a scs  . Wears glasses     Surgical History: Past Surgical History:  Procedure Laterality Date  . BACK SURGERY    . CARDIAC CATHETERIZATION N/A 06/27/2015   Procedure: Left Heart Cath and Coronary Angiography;  Surgeon: Dionisio David, MD;  Location: [place] [facility];  Service: Cardiovascular;  Laterality: N/A;  . CARDIAC CATHETERIZATION N/A 06/27/2015   Procedure: Coronary Stent Intervention;  Surgeon: Yolonda Kida, MD;  Location: [place] [facility];  Service: Cardiovascular;  Laterality: N/A;  . CLOSED REDUCTION NASAL FRACTURE  12/22/2011   Procedure: CLOSED REDUCTION NASAL FRACTURE;  Surgeon: Ascencion Dike, MD;  Location: [facility];  Service: ENT;  Laterality: N/A;  closed  reduction of nasal fracture  . FACIAL FRACTURE SURGERY     face-upper jaw with dental implants  . GASTRIC BYPASS  2011   has lost 140lb  . IRRIGATION AND DEBRIDEMENT FOOT Right 02/21/2017   Procedure: IRRIGATION AND DEBRIDEMENT FOOT;  Surgeon: Sharlotte Alamo, DPM;  Location: [facility];  Service: Podiatry;  Laterality: Right;  . LEFT HEART CATH AND CORONARY ANGIOGRAPHY N/A 06/25/2016   Procedure: Left Heart Cath and Coronary Angiography;  Surgeon: Corey Skains, MD;  Location: [place] [facility];  Service: Cardiovascular;  Laterality: N/A;  . METATARSAL OSTEOTOMY Right 02/10/2017   Procedure: METATARSAL OSTEOTOMY-GREAT TOE AND 1ST METATARSAL;  Surgeon: Samara Deist, DPM;  Location: [facility];  Service: Podiatry;  Laterality: Right;  . ORIF TOE FRACTURE Right 02/17/2017   Procedure: Open reduction with internal fixation displaced osteotomy and fracture first metatarsal;  Surgeon: Samara Deist, DPM;  Location: [facility];  Service: Podiatry;  Laterality: Right;  IVA / POPLITEAL  . REPAIR TENDONS FOOT  2002   rt foot  . SPINAL CORD STIMULATOR IMPLANT  6/13    Home Medications:  Allergies as of 08/05/2017      Reactions   Lisinopril Cough      Medication List        Accurate as of 08/05/17  2:54 PM. Always use your most recent med list.  acidophilus Caps capsule Take 1 capsule by mouth daily.   albuterol 108 (90 Base) MCG/ACT inhaler Commonly known as:  PROVENTIL HFA;VENTOLIN HFA Inhale 2 puffs into the lungs every 6 (six) hours as needed for wheezing or shortness of breath.   ALPRAZolam 1 MG tablet Commonly known as:  XANAX Take 1 mg by mouth 3 (three) times daily.   amLODipine 5 MG tablet Commonly known as:  NORVASC Take 5 mg by mouth daily.   aspirin EC 81 MG tablet Take 1 tablet (81 mg total) by mouth daily.   atorvastatin 40 MG tablet Commonly known as:  LIPITOR Take 1 tablet (40 mg total) by mouth daily at 6 PM.   B-12 PO Take 1  tablet by mouth daily.   BRILINTA 90 MG Tabs tablet Generic drug:  ticagrelor Take by mouth 2 (two) times daily.   busPIRone 15 MG tablet Commonly known as:  BUSPAR Take 30 mg by mouth 2 (two) times daily.   carvedilol 6.25 MG tablet Commonly known as:  COREG Take 1 tablet (6.25 mg total) by mouth 2 (two) times daily with a meal.   DULoxetine 60 MG capsule Commonly known as:  CYMBALTA Take 60 mg by mouth daily.   FIRST-DUKES MOUTHWASH Susp Swish and swallow with 39mls QID prn sore throat   Levomilnacipran HCl ER 40 MG Cp24 Take 40 mg by mouth daily.   levothyroxine 75 MCG tablet Commonly known as:  SYNTHROID, LEVOTHROID Take 75 mcg by mouth daily before breakfast.   MEDI-PATCH-LIDOCAINE EX Apply topically.   morphine 15 MG 12 hr tablet Commonly known as:  MS CONTIN Take 15-30 mg by mouth every 12 (twelve) hours. Pt takes two tablets in the morning and one at night.   MOVANTIK 25 MG Tabs tablet Generic drug:  naloxegol oxalate Take 25 mg by mouth daily.   multivitamin with minerals Tabs tablet Take 1 tablet by mouth daily.   omeprazole 20 MG capsule Commonly known as:  PRILOSEC Take 20 mg by mouth daily.   ondansetron 8 MG tablet Commonly known as:  ZOFRAN Take 8 mg by mouth every 8 (eight) hours as needed for nausea or vomiting.   Oxycodone HCl 10 MG Tabs Take 10 mg by mouth 6 (six) times daily.   polyethylene glycol packet Commonly known as:  MIRALAX / GLYCOLAX Take 17 g daily as needed by mouth for mild constipation.   pregabalin 75 MG capsule Commonly known as:  LYRICA Take 1 capsule (75 mg total) by mouth 3 (three) times daily.   sildenafil 20 MG tablet Commonly known as:  REVATIO Take 20 mg by mouth 3 (three) times daily.   testosterone cypionate 200 MG/ML injection Commonly known as:  DEPOTESTOSTERONE CYPIONATE Inject 0.7 mLs (140 mg total) into the muscle once a week.   zolpidem 10 MG tablet Commonly known as:  AMBIEN Take 1 tablet (10 mg  total) by mouth at bedtime.       Allergies:  Allergies  Allergen Reactions  . Lisinopril Cough    Family History: Family History  Problem Relation Age of Onset  . Diabetes Father     Social History:  reports that he has never smoked. He has never used smokeless tobacco. He reports that he does not drink alcohol or use drugs.  ROS: UROLOGY Frequent Urination?: Yes Hard to postpone urination?: Yes Burning/pain with urination?: No Get up at night to urinate?: Yes Leakage of urine?: Yes Urine stream starts and stops?: Yes Trouble starting stream?: Yes  Do you have to strain to urinate?: No Blood in urine?: No Urinary tract infection?: No Sexually transmitted disease?: No Injury to kidneys or bladder?: No Painful intercourse?: No Weak stream?: No Erection problems?: Yes Penile pain?: No  Gastrointestinal Nausea?: No Vomiting?: No Indigestion/heartburn?: No Diarrhea?: No Constipation?: Yes  Constitutional Fever: No Night sweats?: No Weight loss?: No Fatigue?: Yes  Skin Skin rash/lesions?: No Itching?: No  Eyes Blurred vision?: No Double vision?: No  Ears/Nose/Throat Sore throat?: No Sinus problems?: No  Hematologic/Lymphatic Swollen glands?: No Easy bruising?: No  Cardiovascular Leg swelling?: Yes Chest pain?: No  Respiratory Cough?: No Shortness of breath?: No  Endocrine Excessive thirst?: No  Musculoskeletal Back pain?: Yes Joint pain?: Yes  Neurological Headaches?: No Dizziness?: No  Psychologic Depression?: Yes Anxiety?: Yes  Physical Exam: BP (!) 166/102   Pulse 93   Ht 6\' 1"  (1.854 m)   Wt 295 lb (133.8 kg)   BMI 38.92 kg/m   Constitutional:  Alert and oriented, No acute distress. HEENT: [place] AT, moist mucus membranes.  Trachea midline, no masses. Cardiovascular: No clubbing, cyanosis, or edema. Respiratory: Normal respiratory effort, no increased work of breathing. GI: Abdomen is soft, nontender, nondistended, no  abdominal masses GU: No CVA tenderness.  Prostate 50 g, smooth without nodules Lymph: No cervical or inguinal lymphadenopathy. Skin: No rashes, bruises or suspicious lesions. Neurologic: Grossly intact, no focal deficits, moving all 4 extremities. Psychiatric: Normal mood and affect.  Laboratory Data: Lab Results  Component Value Date   WBC 7.2 02/25/2017   HGB 14.3 02/25/2017   HCT 43.4 02/25/2017   MCV 91.5 02/25/2017   PLT 254 02/25/2017    Lab Results  Component Value Date   CREATININE 1.09 02/24/2017    Lab Results  Component Value Date   TESTOSTERONE 649 03/17/2017    Lab Results  Component Value Date   HGBA1C 6.5 (H) 02/22/2017    Urinalysis Dipstick/microscopy negative  Assessment & Plan:   57 year old male with 35-month history of bothersome lower urinary tract symptoms.  PVR by bladder scan today was 0 mL.  Urinalysis was unremarkable.  We discussed the most common cause of his voiding symptoms is prostate enlargement.  His symptoms are bothersome enough that he would like to try medications.  Rx tamsulosin was sent to his pharmacy.  Follow-up 1 month for symptom reassessment.  His testosterone was refilled.  Monitoring blood work was drawn including PSA, hematocrit and testosterone level.   Abbie Sons, [place] [address] [place], [place] [address] [phone]

## 2017-08-06 ENCOUNTER — Other Ambulatory Visit: Payer: Self-pay | Admitting: Nurse Practitioner

## 2017-08-06 DIAGNOSIS — J4520 Mild intermittent asthma, uncomplicated: Secondary | ICD-10-CM

## 2017-08-06 LAB — HEMATOCRIT: HEMATOCRIT: 48.6 % (ref 37.5–51.0)

## 2017-08-06 LAB — PSA: Prostate Specific Ag, Serum: 1.5 ng/mL (ref 0.0–4.0)

## 2017-08-06 LAB — TESTOSTERONE: Testosterone: 752 ng/dL (ref 264–916)

## 2017-08-08 ENCOUNTER — Encounter: Payer: Self-pay | Admitting: Urology

## 2017-08-09 ENCOUNTER — Telehealth: Payer: Self-pay

## 2017-08-09 NOTE — Telephone Encounter (Signed)
Patient notified

## 2017-08-09 NOTE — Telephone Encounter (Signed)
-----   Message from Abbie Sons, MD sent at 08/08/2017  9:52 AM EDT ----- Testosterone level looks good at 752.  Hematocrit normal at 48.6.  PSA stable at 1.5.

## 2017-08-10 ENCOUNTER — Other Ambulatory Visit: Payer: Self-pay | Admitting: Nurse Practitioner

## 2017-08-10 ENCOUNTER — Other Ambulatory Visit: Payer: Self-pay | Admitting: Urology

## 2017-08-10 DIAGNOSIS — J4520 Mild intermittent asthma, uncomplicated: Secondary | ICD-10-CM

## 2017-08-10 MED ORDER — ALBUTEROL SULFATE HFA 108 (90 BASE) MCG/ACT IN AERS: 2.0000 | INHALATION_SPRAY | Freq: Four times a day (QID) | RESPIRATORY_TRACT | 0 refills | Status: DC | PRN

## 2017-08-17 DIAGNOSIS — Z1211 Encounter for screening for malignant neoplasm of colon: Secondary | ICD-10-CM | POA: Diagnosis not present

## 2017-08-17 DIAGNOSIS — Z7901 Long term (current) use of anticoagulants: Secondary | ICD-10-CM | POA: Diagnosis not present

## 2017-08-20 ENCOUNTER — Inpatient Hospital Stay
Admission: EM | Admit: 2017-08-20 | Discharge: 2017-08-25 | DRG: 501 | Disposition: A | Discharge status: Home Health | Payer: BLUE CROSS/BLUE SHIELD | Attending: Internal Medicine | Admitting: Internal Medicine

## 2017-08-20 ENCOUNTER — Emergency Department: Payer: BLUE CROSS/BLUE SHIELD

## 2017-08-20 ENCOUNTER — Other Ambulatory Visit: Payer: Self-pay

## 2017-08-20 ENCOUNTER — Encounter: Payer: Self-pay | Admitting: Emergency Medicine

## 2017-08-20 DIAGNOSIS — K5900 Constipation, unspecified: Secondary | ICD-10-CM | POA: Diagnosis not present

## 2017-08-20 DIAGNOSIS — Z955 Presence of coronary angioplasty implant and graft: Secondary | ICD-10-CM

## 2017-08-20 DIAGNOSIS — M205X1 Other deformities of toe(s) (acquired), right foot: Secondary | ICD-10-CM | POA: Diagnosis not present

## 2017-08-20 DIAGNOSIS — M869 Osteomyelitis, unspecified: Secondary | ICD-10-CM | POA: Diagnosis present

## 2017-08-20 DIAGNOSIS — E291 Testicular hypofunction: Secondary | ICD-10-CM | POA: Diagnosis not present

## 2017-08-20 DIAGNOSIS — Z8711 Personal history of peptic ulcer disease: Secondary | ICD-10-CM

## 2017-08-20 DIAGNOSIS — G4700 Insomnia, unspecified: Secondary | ICD-10-CM | POA: Diagnosis present

## 2017-08-20 DIAGNOSIS — Z9689 Presence of other specified functional implants: Secondary | ICD-10-CM | POA: Diagnosis present

## 2017-08-20 DIAGNOSIS — I252 Old myocardial infarction: Secondary | ICD-10-CM

## 2017-08-20 DIAGNOSIS — B952 Enterococcus as the cause of diseases classified elsewhere: Secondary | ICD-10-CM | POA: Diagnosis present

## 2017-08-20 DIAGNOSIS — T8469XA Infection and inflammatory reaction due to internal fixation device of other site, initial encounter: Principal | ICD-10-CM | POA: Diagnosis present

## 2017-08-20 DIAGNOSIS — E039 Hypothyroidism, unspecified: Secondary | ICD-10-CM | POA: Diagnosis present

## 2017-08-20 DIAGNOSIS — Z79890 Hormone replacement therapy (postmenopausal): Secondary | ICD-10-CM

## 2017-08-20 DIAGNOSIS — M86171 Other acute osteomyelitis, right ankle and foot: Secondary | ICD-10-CM | POA: Diagnosis not present

## 2017-08-20 DIAGNOSIS — Z136 Encounter for screening for cardiovascular disorders: Secondary | ICD-10-CM | POA: Diagnosis not present

## 2017-08-20 DIAGNOSIS — F419 Anxiety disorder, unspecified: Secondary | ICD-10-CM | POA: Diagnosis present

## 2017-08-20 DIAGNOSIS — Z79891 Long term (current) use of opiate analgesic: Secondary | ICD-10-CM

## 2017-08-20 DIAGNOSIS — N400 Enlarged prostate without lower urinary tract symptoms: Secondary | ICD-10-CM | POA: Diagnosis not present

## 2017-08-20 DIAGNOSIS — K219 Gastro-esophageal reflux disease without esophagitis: Secondary | ICD-10-CM | POA: Diagnosis present

## 2017-08-20 DIAGNOSIS — I25119 Atherosclerotic heart disease of native coronary artery with unspecified angina pectoris: Secondary | ICD-10-CM | POA: Diagnosis not present

## 2017-08-20 DIAGNOSIS — Z9884 Bariatric surgery status: Secondary | ICD-10-CM

## 2017-08-20 DIAGNOSIS — M79671 Pain in right foot: Secondary | ICD-10-CM | POA: Diagnosis present

## 2017-08-20 DIAGNOSIS — R7303 Prediabetes: Secondary | ICD-10-CM | POA: Diagnosis present

## 2017-08-20 DIAGNOSIS — Z833 Family history of diabetes mellitus: Secondary | ICD-10-CM

## 2017-08-20 DIAGNOSIS — M21619 Bunion of unspecified foot: Secondary | ICD-10-CM | POA: Diagnosis not present

## 2017-08-20 DIAGNOSIS — G4730 Sleep apnea, unspecified: Secondary | ICD-10-CM | POA: Diagnosis present

## 2017-08-20 DIAGNOSIS — I10 Essential (primary) hypertension: Secondary | ICD-10-CM | POA: Diagnosis not present

## 2017-08-20 DIAGNOSIS — T847XXA Infection and inflammatory reaction due to other internal orthopedic prosthetic devices, implants and grafts, initial encounter: Secondary | ICD-10-CM | POA: Diagnosis not present

## 2017-08-20 DIAGNOSIS — M868X7 Other osteomyelitis, ankle and foot: Secondary | ICD-10-CM | POA: Diagnosis not present

## 2017-08-20 DIAGNOSIS — G894 Chronic pain syndrome: Secondary | ICD-10-CM | POA: Diagnosis present

## 2017-08-20 DIAGNOSIS — Y838 Other surgical procedures as the cause of abnormal reaction of the patient, or of later complication, without mention of misadventure at the time of the procedure: Secondary | ICD-10-CM | POA: Diagnosis present

## 2017-08-20 DIAGNOSIS — Z888 Allergy status to other drugs, medicaments and biological substances status: Secondary | ICD-10-CM

## 2017-08-20 DIAGNOSIS — M1990 Unspecified osteoarthritis, unspecified site: Secondary | ICD-10-CM | POA: Diagnosis not present

## 2017-08-20 DIAGNOSIS — T84018A Broken internal joint prosthesis, other site, initial encounter: Secondary | ICD-10-CM | POA: Diagnosis present

## 2017-08-20 DIAGNOSIS — Z472 Encounter for removal of internal fixation device: Secondary | ICD-10-CM | POA: Diagnosis not present

## 2017-08-20 DIAGNOSIS — R262 Difficulty in walking, not elsewhere classified: Secondary | ICD-10-CM | POA: Diagnosis present

## 2017-08-20 DIAGNOSIS — M86179 Other acute osteomyelitis, unspecified ankle and foot: Secondary | ICD-10-CM | POA: Diagnosis not present

## 2017-08-20 DIAGNOSIS — R52 Pain, unspecified: Secondary | ICD-10-CM | POA: Diagnosis not present

## 2017-08-20 DIAGNOSIS — Z79899 Other long term (current) drug therapy: Secondary | ICD-10-CM

## 2017-08-20 DIAGNOSIS — F329 Major depressive disorder, single episode, unspecified: Secondary | ICD-10-CM | POA: Diagnosis present

## 2017-08-20 DIAGNOSIS — L03115 Cellulitis of right lower limb: Secondary | ICD-10-CM

## 2017-08-20 DIAGNOSIS — Z7982 Long term (current) use of aspirin: Secondary | ICD-10-CM

## 2017-08-20 DIAGNOSIS — I2510 Atherosclerotic heart disease of native coronary artery without angina pectoris: Secondary | ICD-10-CM | POA: Diagnosis not present

## 2017-08-20 DIAGNOSIS — M7989 Other specified soft tissue disorders: Secondary | ICD-10-CM | POA: Diagnosis not present

## 2017-08-20 LAB — CBC WITH DIFFERENTIAL/PLATELET
Basophils Absolute: 0.1 10*3/uL (ref 0–0.1)
Basophils Relative: 1 %
Eosinophils Absolute: 0.1 10*3/uL (ref 0–0.7)
Eosinophils Relative: 1 %
HEMATOCRIT: 44 % (ref 40.0–52.0)
Hemoglobin: 14.6 g/dL (ref 13.0–18.0)
Lymphocytes Relative: 19 %
Lymphs Abs: 1.9 10*3/uL (ref 1.0–3.6)
MCH: 28.3 pg (ref 26.0–34.0)
MCHC: 33.3 g/dL (ref 32.0–36.0)
MCV: 85.1 fL (ref 80.0–100.0)
MONO ABS: 0.9 10*3/uL (ref 0.2–1.0)
Monocytes Relative: 9 %
NEUTROS ABS: 7.1 10*3/uL — AB (ref 1.4–6.5)
Neutrophils Relative %: 70 %
PLATELETS: 185 10*3/uL (ref 150–440)
RBC: 5.17 MIL/uL (ref 4.40–5.90)
RDW: 19.3 % — AB (ref 11.5–14.5)
WBC: 10.1 10*3/uL (ref 3.8–10.6)

## 2017-08-20 LAB — COMPREHENSIVE METABOLIC PANEL
ALBUMIN: 3.6 g/dL (ref 3.5–5.0)
ALK PHOS: 78 U/L (ref 38–126)
ALT: 19 U/L (ref 17–63)
ANION GAP: 6 (ref 5–15)
AST: 19 U/L (ref 15–41)
BUN: 9 mg/dL (ref 6–20)
CO2: 27 mmol/L (ref 22–32)
Calcium: 8.3 mg/dL — ABNORMAL LOW (ref 8.9–10.3)
Chloride: 100 mmol/L — ABNORMAL LOW (ref 101–111)
Creatinine, Ser: 1.06 mg/dL (ref 0.61–1.24)
GFR calc non Af Amer: >60 mL/min (ref >60)
GLUCOSE: 160 mg/dL — AB (ref 65–99)
Potassium: 3.6 mmol/L (ref 3.5–5.1)
Sodium: 133 mmol/L — ABNORMAL LOW (ref 135–145)
TOTAL PROTEIN: 7.2 g/dL (ref 6.5–8.1)
Total Bilirubin: 1.1 mg/dL (ref 0.3–1.2)

## 2017-08-20 LAB — CBC
HEMATOCRIT: 42 % (ref 40.0–52.0)
HEMOGLOBIN: 14.4 g/dL (ref 13.0–18.0)
MCH: 28.9 pg (ref 26.0–34.0)
MCHC: 34.2 g/dL (ref 32.0–36.0)
MCV: 84.7 fL (ref 80.0–100.0)
Platelets: 200 10*3/uL (ref 150–440)
RBC: 4.96 MIL/uL (ref 4.40–5.90)
RDW: 19.2 % — ABNORMAL HIGH (ref 11.5–14.5)
WBC: 10.7 10*3/uL — AB (ref 3.8–10.6)

## 2017-08-20 LAB — CREATININE, SERUM: Creatinine, Ser: 1.05 mg/dL (ref 0.61–1.24)

## 2017-08-20 LAB — SEDIMENTATION RATE: SED RATE: 15 mm/h (ref 0–20)

## 2017-08-20 LAB — LACTIC ACID, PLASMA: Lactic Acid, Venous: 1.4 mmol/L (ref 0.5–1.9)

## 2017-08-20 MED ORDER — ENOXAPARIN SODIUM 40 MG/0.4ML ~~LOC~~ SOLN
40.0000 mg | SUBCUTANEOUS | Status: DC
  Administered 2017-08-20 – 2017-08-24 (×5): 40 mg via SUBCUTANEOUS
  Filled 2017-08-20 (×5): qty 0.4

## 2017-08-20 MED ORDER — ALBUTEROL SULFATE (2.5 MG/3ML) 0.083% IN NEBU: 2.5000 mg | INHALATION_SOLUTION | Freq: Four times a day (QID) | RESPIRATORY_TRACT | Status: DC | PRN

## 2017-08-20 MED ORDER — BUSPIRONE HCL 10 MG PO TABS
30.0000 mg | ORAL_TABLET | Freq: Three times a day (TID) | ORAL | Status: DC
  Administered 2017-08-20 – 2017-08-23 (×8): 30 mg via ORAL
  Filled 2017-08-20 (×4): qty 6
  Filled 2017-08-20 (×2): qty 3
  Filled 2017-08-20: qty 6
  Filled 2017-08-20: qty 3

## 2017-08-20 MED ORDER — DULOXETINE HCL 60 MG PO CPEP
60.0000 mg | ORAL_CAPSULE | Freq: Every day | ORAL | Status: DC
  Administered 2017-08-21 – 2017-08-25 (×4): 60 mg via ORAL
  Filled 2017-08-20 (×5): qty 1

## 2017-08-20 MED ORDER — ADULT MULTIVITAMIN W/MINERALS CH
1.0000 | ORAL_TABLET | Freq: Every day | ORAL | Status: DC
  Administered 2017-08-21 – 2017-08-25 (×4): 1 via ORAL
  Filled 2017-08-20 (×4): qty 1

## 2017-08-20 MED ORDER — HYDROMORPHONE HCL 1 MG/ML IJ SOLN
1.0000 mg | Freq: Once | INTRAMUSCULAR | Status: AC
  Administered 2017-08-20: 1 mg via INTRAVENOUS
  Filled 2017-08-20: qty 1

## 2017-08-20 MED ORDER — RISAQUAD PO CAPS
1.0000 | ORAL_CAPSULE | Freq: Every day | ORAL | Status: DC
  Administered 2017-08-21 – 2017-08-25 (×4): 1 via ORAL
  Filled 2017-08-20 (×5): qty 1

## 2017-08-20 MED ORDER — ACETAMINOPHEN 650 MG RE SUPP: 650.0000 mg | Freq: Four times a day (QID) | RECTAL | Status: DC | PRN

## 2017-08-20 MED ORDER — ASPIRIN EC 81 MG PO TBEC
81.0000 mg | DELAYED_RELEASE_TABLET | Freq: Every day | ORAL | Status: DC
  Administered 2017-08-21 – 2017-08-25 (×4): 81 mg via ORAL
  Filled 2017-08-20 (×4): qty 1

## 2017-08-20 MED ORDER — VANCOMYCIN HCL IN DEXTROSE 1-5 GM/200ML-% IV SOLN
1000.0000 mg | Freq: Once | INTRAVENOUS | Status: AC
  Administered 2017-08-20: 1000 mg via INTRAVENOUS
  Filled 2017-08-20: qty 200

## 2017-08-20 MED ORDER — OXYCODONE HCL 5 MG PO TABS
10.0000 mg | ORAL_TABLET | Freq: Every day | ORAL | Status: DC
  Administered 2017-08-20 – 2017-08-25 (×28): 10 mg via ORAL
  Filled 2017-08-20 (×28): qty 2

## 2017-08-20 MED ORDER — TAMSULOSIN HCL 0.4 MG PO CAPS
0.4000 mg | ORAL_CAPSULE | Freq: Every day | ORAL | Status: DC
  Administered 2017-08-21 – 2017-08-25 (×4): 0.4 mg via ORAL
  Filled 2017-08-20 (×4): qty 1

## 2017-08-20 MED ORDER — ONDANSETRON HCL 4 MG/2ML IJ SOLN: 4.0000 mg | Freq: Four times a day (QID) | INTRAMUSCULAR | Status: DC | PRN

## 2017-08-20 MED ORDER — LEVOTHYROXINE SODIUM 50 MCG PO TABS
75.0000 ug | ORAL_TABLET | Freq: Every day | ORAL | Status: DC
  Administered 2017-08-22 – 2017-08-25 (×4): 75 ug via ORAL
  Filled 2017-08-20 (×3): qty 1
  Filled 2017-08-20: qty 2

## 2017-08-20 MED ORDER — POLYETHYLENE GLYCOL 3350 17 G PO PACK
17.0000 g | PACK | Freq: Every day | ORAL | Status: DC | PRN
Start: 2017-08-20 — End: 2017-08-25
  Administered 2017-08-23 – 2017-08-24 (×2): 17 g via ORAL
  Filled 2017-08-20 (×2): qty 1

## 2017-08-20 MED ORDER — ONDANSETRON HCL 4 MG/2ML IJ SOLN
4.0000 mg | Freq: Once | INTRAMUSCULAR | Status: AC
  Administered 2017-08-20: 4 mg via INTRAVENOUS
  Filled 2017-08-20: qty 2

## 2017-08-20 MED ORDER — VANCOMYCIN HCL 10 G IV SOLR
1250.0000 mg | Freq: Three times a day (TID) | INTRAVENOUS | Status: DC
  Administered 2017-08-20 – 2017-08-21 (×3): 1250 mg via INTRAVENOUS
  Filled 2017-08-20 (×6): qty 1250

## 2017-08-20 MED ORDER — ZOLPIDEM TARTRATE 5 MG PO TABS
10.0000 mg | ORAL_TABLET | Freq: Every day | ORAL | Status: DC
  Administered 2017-08-20 – 2017-08-24 (×5): 10 mg via ORAL
  Filled 2017-08-20 (×5): qty 2

## 2017-08-20 MED ORDER — AMLODIPINE BESYLATE 5 MG PO TABS
5.0000 mg | ORAL_TABLET | Freq: Every day | ORAL | Status: DC
  Administered 2017-08-21 – 2017-08-25 (×4): 5 mg via ORAL
  Filled 2017-08-20 (×4): qty 1

## 2017-08-20 MED ORDER — DOCUSATE SODIUM 100 MG PO CAPS
100.0000 mg | ORAL_CAPSULE | Freq: Two times a day (BID) | ORAL | Status: DC
  Administered 2017-08-20: 100 mg via ORAL
  Filled 2017-08-20: qty 1

## 2017-08-20 MED ORDER — TESTOSTERONE CYPIONATE 200 MG/ML IM SOLN: 140.0000 mg | INTRAMUSCULAR | Status: DC

## 2017-08-20 MED ORDER — BISACODYL 5 MG PO TBEC
5.0000 mg | DELAYED_RELEASE_TABLET | Freq: Every day | ORAL | Status: DC | PRN
  Filled 2017-08-20: qty 1

## 2017-08-20 MED ORDER — MORPHINE SULFATE ER 15 MG PO TBCR
15.0000 mg | EXTENDED_RELEASE_TABLET | Freq: Two times a day (BID) | ORAL | Status: DC
  Administered 2017-08-20: 15 mg via ORAL
  Administered 2017-08-21 – 2017-08-22 (×3): 30 mg via ORAL
  Administered 2017-08-23: 15 mg via ORAL
  Administered 2017-08-23 – 2017-08-24 (×2): 30 mg via ORAL
  Administered 2017-08-24: 15 mg via ORAL
  Administered 2017-08-25: 30 mg via ORAL
  Filled 2017-08-20 (×6): qty 2
  Filled 2017-08-20 (×2): qty 1
  Filled 2017-08-20: qty 2

## 2017-08-20 MED ORDER — CARVEDILOL 3.125 MG PO TABS
6.2500 mg | ORAL_TABLET | Freq: Two times a day (BID) | ORAL | Status: DC
  Administered 2017-08-20 – 2017-08-25 (×10): 6.25 mg via ORAL
  Filled 2017-08-20: qty 1
  Filled 2017-08-20 (×3): qty 2
  Filled 2017-08-20: qty 1
  Filled 2017-08-20 (×4): qty 2
  Filled 2017-08-20 (×2): qty 1
  Filled 2017-08-20 (×2): qty 2

## 2017-08-20 MED ORDER — PIPERACILLIN-TAZOBACTAM 3.375 G IVPB
3.3750 g | Freq: Three times a day (TID) | INTRAVENOUS | Status: DC
  Administered 2017-08-20 – 2017-08-25 (×15): 3.375 g via INTRAVENOUS
  Filled 2017-08-20 (×15): qty 50

## 2017-08-20 MED ORDER — VITAMIN B-12 1000 MCG PO TABS
ORAL_TABLET | Freq: Every day | ORAL | Status: DC
  Administered 2017-08-20: 500 ug via ORAL
  Administered 2017-08-21 – 2017-08-25 (×4): 1000 ug via ORAL
  Filled 2017-08-20 (×6): qty 1

## 2017-08-20 MED ORDER — PREGABALIN 75 MG PO CAPS
75.0000 mg | ORAL_CAPSULE | Freq: Three times a day (TID) | ORAL | Status: DC
  Administered 2017-08-20 – 2017-08-25 (×15): 75 mg via ORAL
  Filled 2017-08-20 (×15): qty 1

## 2017-08-20 MED ORDER — LEVOMILNACIPRAN HCL ER 40 MG PO CP24
40.0000 mg | ORAL_CAPSULE | Freq: Every day | ORAL | Status: DC
  Administered 2017-08-23 – 2017-08-25 (×3): 40 mg via ORAL
  Filled 2017-08-20 (×3): qty 1

## 2017-08-20 MED ORDER — ACETAMINOPHEN 325 MG PO TABS: 650.0000 mg | ORAL_TABLET | Freq: Four times a day (QID) | ORAL | Status: DC | PRN

## 2017-08-20 MED ORDER — ONDANSETRON HCL 4 MG PO TABS: 4.0000 mg | ORAL_TABLET | Freq: Four times a day (QID) | ORAL | Status: DC | PRN

## 2017-08-20 MED ORDER — PIPERACILLIN-TAZOBACTAM 3.375 G IVPB 30 MIN
3.3750 g | Freq: Once | INTRAVENOUS | Status: AC
  Administered 2017-08-20: 3.375 g via INTRAVENOUS
  Filled 2017-08-20: qty 50

## 2017-08-20 MED ORDER — ATORVASTATIN CALCIUM 20 MG PO TABS
40.0000 mg | ORAL_TABLET | Freq: Every day | ORAL | Status: DC
  Administered 2017-08-20 – 2017-08-24 (×5): 40 mg via ORAL
  Filled 2017-08-20 (×6): qty 2

## 2017-08-20 MED ORDER — MORPHINE SULFATE (PF) 4 MG/ML IV SOLN
4.0000 mg | Freq: Once | INTRAVENOUS | Status: AC
  Administered 2017-08-20: 4 mg via INTRAVENOUS
  Filled 2017-08-20: qty 1

## 2017-08-20 NOTE — Progress Notes (Signed)
Pharmacy Antibiotic Note  Charles [name] is a 57 y.o. male admitted on 08/20/2017 with osteomyelitis.  Pharmacy has been consulted for vancomycin dosing.  Plan: Vancomycin 1000mg  given in ED. Stacked dose calculated at 6 hours. Will follow with 1250mg  IV vancomycin every 8 hours. Estimated levels at steady state are 30/15 mcg/ml.  Goal trough 15-20 mcg/mL. Vt will be ordered prior to the 5th scheduled dose.  Ke: 0.096 T1/2: 7h Vd: 71L  Zosyn dose is 3.375g EI q8h  Height: 6\' 1"  (185.4 cm) Weight: 295 lb (133.8 kg) IBW/kg (Calculated) : 79.9  Temp (24hrs), Avg:98.5 F (36.9 C), Min:98.5 F (36.9 C), Max:98.5 F (36.9 C)  Recent Labs  Lab 08/20/17 1418  WBC 10.1  CREATININE 1.06  LATICACIDVEN 1.4    Estimated Creatinine Clearance: 110.4 mL/min (by C-G formula based on SCr of 1.06 mg/dL).    Allergies  Allergen Reactions  . Lisinopril Cough    Antimicrobials this admission: Vancomycin 5/3 >>  Zosyn 5/3 >>   Microbiology results: Wound 5/3  Thank you for allowing pharmacy to be a part of this patient's care.  Dallie Piles 08/20/2017 3:52 PM

## 2017-08-20 NOTE — ED Triage Notes (Signed)
Had surgery on right foot last year and had additional surgeries and infection.  The foot is now swollen to twice size of usual and is reddened for about 4 days.
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
## 2017-08-20 NOTE — H&P (Signed)
[facility] [place] NAME: Charles [name]    MR#:  [id number]  DATE OF BIRTH:  1961-02-20  DATE OF ADMISSION:  08/20/2017  PRIMARY CARE PHYSICIAN: Lavera Guise, MD   REQUESTING/REFERRING PHYSICIAN: Dr. Kelli Hope  CHIEF COMPLAINT: Worsening swelling of right foot   Chief Complaint  Patient presents with  . Foot Pain    HISTORY OF PRESENT ILLNESS:  Charles [name]  is a 57 y.o. male with a known history multiple right foot surgeries and because of foot pain, swelling and redness starts getting worse for the past 3 4 days.  P patient had bunion surgery for the right great toe in 8299 complicated by fracture, hardware placement, infection before.  Followed by Dr. Vickki Muff.  Patient has been having swelling and redness since Monday now today he is having worsening swelling, unable to walk, patient redness extended to lower part of the right leg.  Patient has redness of the right foot and also lower part of right leg now.  No fever.  Patient is unable to walk today so he came to hospital.  Has pain 10 out of 10 in severity mainly along great toe and also the pain extending all the way to heel and also ankle area. PAST MEDICAL HISTORY:   Past Medical History:  Diagnosis Date  . Anginal pain ([place])   . Anxiety   . Arthritis   . Chronic back pain   . Coronary artery disease   . Depression   . GERD (gastroesophageal reflux disease)   . Hypertension   . Hypothyroidism   . Insomnia   . Low testosterone   . Myocardial infarction ([place])   . Peptic ulcer   . Shortness of breath   . Sleep apnea    has cpap-has not used since lost 140lb  . Spinal cord stimulator status    has a scs  . Wears glasses     PAST SURGICAL HISTOIRY:   Past Surgical History:  Procedure Laterality Date  . BACK SURGERY    . CARDIAC CATHETERIZATION N/A 06/27/2015   Procedure: Left Heart Cath and Coronary Angiography;  Surgeon: Dionisio David, MD;  Location: [facility];  Service: Cardiovascular;  Laterality: N/A;  . CARDIAC CATHETERIZATION N/A 06/27/2015   Procedure: Coronary Stent Intervention;  Surgeon: Yolonda Kida, MD;  Location: [facility];  Service: Cardiovascular;  Laterality: N/A;  . CLOSED REDUCTION NASAL FRACTURE  12/22/2011   Procedure: CLOSED REDUCTION NASAL FRACTURE;  Surgeon: Ascencion Dike, MD;  Location: [facility];  Service: ENT;  Laterality: N/A;  closed reduction of nasal fracture  . FACIAL FRACTURE SURGERY     face-upper jaw with dental implants  . GASTRIC BYPASS  2011   has lost 140lb  . IRRIGATION AND DEBRIDEMENT FOOT Right 02/21/2017   Procedure: IRRIGATION AND DEBRIDEMENT FOOT;  Surgeon: Sharlotte Alamo, DPM;  Location: [facility];  Service: Podiatry;  Laterality: Right;  . LEFT HEART CATH AND CORONARY ANGIOGRAPHY N/A 06/25/2016   Procedure: Left Heart Cath and Coronary Angiography;  Surgeon: Corey Skains, MD;  Location: [facility];  Service: Cardiovascular;  Laterality: N/A;  . METATARSAL OSTEOTOMY Right 02/10/2017   Procedure: METATARSAL OSTEOTOMY-GREAT TOE AND 1ST METATARSAL;  Surgeon: Samara Deist, DPM;  Location: [facility];  Service: Podiatry;  Laterality: Right;  . ORIF TOE FRACTURE Right 02/17/2017   Procedure: Open reduction with internal fixation displaced osteotomy and fracture  first metatarsal;  Surgeon: Samara Deist, DPM;  Location: [facility];  Service: Podiatry;  Laterality: Right;  IVA / POPLITEAL  . REPAIR TENDONS FOOT  2002   rt foot  . SPINAL CORD STIMULATOR IMPLANT  6/13    SOCIAL HISTORY:   Social History   Tobacco Use  . Smoking status: Never Smoker  . Smokeless tobacco: Never Used  Substance Use Topics  . Alcohol use: No    Alcohol/week: 0.0 oz    Comment: not in 4 yr    FAMILY HISTORY:   Family History  Problem Relation Age of Onset  . Diabetes Father     DRUG ALLERGIES:   Allergies  Allergen Reactions  . Lisinopril Cough     REVIEW OF SYSTEMS:  CONSTITUTIONAL: No fever, fatigue or weakness.  EYES: No blurred or double vision.  EARS, NOSE, AND THROAT: No tinnitus or ear pain.  RESPIRATORY: No cough, shortness of breath, wheezing or hemoptysis.  CARDIOVASCULAR: No chest pain, orthopnea, edema.  GASTROINTESTINAL: No nausea, vomiting, diarrhea or abdominal pain.  GENITOURINARY: No dysuria, hematuria.  ENDOCRINE: No polyuria, nocturia,  HEMATOLOGY: No anemia, easy bruising or bleeding SKIN: No rash or lesion. MUSCULOSKELETAL: Redness, swelling, pain of the right foot.   NEUROLOGIC: No tingling, numbness, weakness.  PSYCHIATRY: No anxiety or depression.   MEDICATIONS AT HOME:   Prior to Admission medications   Medication Sig Start Date End Date Taking? Authorizing Provider  acidophilus (RISAQUAD) CAPS capsule Take 1 capsule by mouth daily.   Yes [provider]  amLODipine (NORVASC) 5 MG tablet Take 5 mg by mouth daily.   Yes [provider]  aspirin EC 81 MG tablet Take 1 tablet (81 mg total) by mouth daily. 11/18/16  Yes Hillary Bow, MD  atorvastatin (LIPITOR) 40 MG tablet Take 1 tablet (40 mg total) by mouth daily at 6 PM. Patient taking differently: Take 20 mg by mouth daily at 6 PM.  06/28/15  Yes Vaughan Basta, MD  busPIRone (BUSPAR) 15 MG tablet Take 30 mg by mouth 3 (three) times daily.    Yes [provider]  carvedilol (COREG) 6.25 MG tablet Take 1 tablet (6.25 mg total) by mouth 2 (two) times daily with a meal. 11/18/16  Yes Sudini, Srikar, MD  Cyanocobalamin (B-12 PO) Take 1 tablet by mouth daily.   Yes [provider]  DULoxetine (CYMBALTA) 60 MG capsule Take 60 mg by mouth daily.   Yes [provider]  Levomilnacipran HCl ER 40 MG CP24 Take 40 mg by mouth daily.   Yes [provider]  levothyroxine (SYNTHROID, LEVOTHROID) 75 MCG tablet Take 75 mcg by mouth daily before breakfast.    Yes [provider]  lidocaine (LIDODERM)  5 % Place 1 patch onto the skin daily. Remove & Discard patch within 12 hours or as directed by MD   Yes [provider]  morphine (MS CONTIN) 15 MG 12 hr tablet Take 15-30 mg by mouth every 12 (twelve) hours. Pt takes two tablets in the morning and one at night.   Yes [provider]  Multiple Vitamin (MULTIVITAMIN WITH MINERALS) TABS tablet Take 1 tablet by mouth daily.   Yes [provider]  naloxegol oxalate (MOVANTIK) 25 MG TABS tablet Take 25 mg by mouth daily.   Yes [provider]  ondansetron (ZOFRAN) 8 MG tablet Take 8 mg by mouth 3 (three) times daily.    Yes [provider]  Oxycodone HCl 10 MG TABS Take 10  mg by mouth 6 (six) times daily.   Yes [provider]  pregabalin (LYRICA) 75 MG capsule Take 1 capsule (75 mg total) by mouth 3 (three) times daily. 05/20/17  Yes Boscia, Greer Ee, NP  tamsulosin (FLOMAX) 0.4 MG CAPS capsule Take 1 capsule (0.4 mg total) by mouth daily. 08/05/17  Yes Stoioff, Ronda Fairly, MD  testosterone cypionate (DEPOTESTOSTERONE CYPIONATE) 200 MG/ML injection Inject 0.7 mLs (140 mg total) into the muscle once a week. 08/05/17  Yes Stoioff, Ronda Fairly, MD  zolpidem (AMBIEN) 10 MG tablet Take 1 tablet (10 mg total) by mouth at bedtime. 05/20/17  Yes Boscia, Greer Ee, NP  albuterol (PROVENTIL HFA;VENTOLIN HFA) 108 (90 Base) MCG/ACT inhaler Inhale 2 puffs into the lungs every 6 (six) hours as needed for wheezing or shortness of breath. 08/10/17   Ronnell Freshwater, NP  Diphenhyd-Hydrocort-Nystatin (FIRST-DUKES MOUTHWASH) SUSP Swish and swallow with 47mls QID prn sore throat Patient not taking: Reported on 08/20/2017 07/20/17   Ronnell Freshwater, NP  polyethylene glycol (MIRALAX / GLYCOLAX) packet Take 17 g daily as needed by mouth for mild constipation. Patient not taking: Reported on 08/20/2017 02/25/17   Gladstone Lighter, MD  sildenafil (REVATIO) 20 MG tablet Take 20 mg by mouth 3 (three) times daily.    [provider]       VITAL SIGNS:  Blood pressure (!) 142/86, pulse 95, temperature 98.5 F (36.9 C), temperature source Oral, resp. rate 16, height 6\' 1"  (1.854 m), weight 133.8 kg (295 lb), SpO2 96 %.  PHYSICAL EXAMINATION:  GENERAL:  58 y.o.-year-old patient lying in the bed with no acute distress.  EYES: Pupils equal, round, reactive to light and accommodation. No scleral icterus. Extraocular muscles intact.  HEENT: Head atraumatic, normocephalic. Oropharynx and nasopharynx clear.  NECK:  Supple, no jugular venous distention. No thyroid enlargement, no tenderness.  LUNGS: Normal breath sounds bilaterally, no wheezing, rales,rhonchi or crepitation. No use of accessory muscles of respiration.  CARDIOVASCULAR: S1, S2 normal. No murmurs, rubs, or gallops.  ABDOMEN: Soft, nontender, nondistended. Bowel sounds present. No organomegaly or mass.  EXTREMITIES: Patient noted to have significant swelling, warmth and erythema of the right foot and right leg, no crepitus, no open bullae.  No obvious abscess.  Intact dorsalis pedis pulse.  Patient has tenderness around right ankle and also right great toe.  Noted to have deformity of the right great toe due to bunion operation.  And obvious scar present medial side of right foot. NEUROLOGIC: Cranial nerves II through XII are intact. Muscle strength 5/5 in all extremities. Sensation intact. Gait not checked.  PSYCHIATRIC: The patient is alert and oriented x 3.  SKIN: No obvious rash, lesion, or ulcer.   LABORATORY PANEL:   CBC Recent Labs  Lab 08/20/17 1418  WBC 10.1  HGB 14.6  HCT 44.0  PLT 185   ------------------------------------------------------------------------------------------------------------------  Chemistries  Recent Labs  Lab 08/20/17 1418  NA 133*  K 3.6  CL 100*  CO2 27  GLUCOSE 160*  BUN 9  CREATININE 1.06  CALCIUM 8.3*  AST 19  ALT 19  ALKPHOS 78  BILITOT 1.1    ------------------------------------------------------------------------------------------------------------------  Cardiac Enzymes No results for input(s): TROPONINI in the last 168 hours. ------------------------------------------------------------------------------------------------------------------  RADIOLOGY:  Dg Foot Complete Right  Result Date: 08/20/2017 CLINICAL DATA:  Foot swelling and redness. EXAM: RIGHT FOOT COMPLETE - 3+ VIEW COMPARISON:  Right foot x-rays dated February 20, 2017. FINDINGS: Prior first metatarsal and first proximal phalanx osteotomies with prominent lucency  about the first metatarsal hardware and osseous resorption along the prior osteotomy site. There is a fracture of the dorsal plate and backing out of several screws. No acute fracture or dislocation. Prior calcaneal osteotomy with single screw fixation. Mild midfoot osteoarthritis. Severe soft tissue swelling along the dorsal and medial forefoot. No subcutaneous emphysema. IMPRESSION: 1. Prior first metatarsal osteotomy with hardware fracture and loosening, probably related to infection given osseous resorption along the osteotomy site, concerning for osteomyelitis, and prominent medial and dorsal forefoot soft tissue swelling. Electronically Signed   By: Titus Dubin M.D.   On: 08/20/2017 14:22    EKG:   Orders placed or performed during the hospital encounter of 11/17/16  . ED EKG  . ED EKG  . EKG 12-Lead  . EKG 12-Lead    IMPRESSION AND PLAN:  57 year old male patient with multiple medical problems of hypertension, hypothyroidism chronic pain secondary to multiple right foot surgeries,operations comes in with worsening swelling, redness and pain of the right foot and unable to ambulate for past for 5 days. Right foot pain, swelling: Patient had a right foot cellulitis, x-ray of the right foot concerning for osteomyelitis of the right first metatarsal.  Patient labs are within normal range.  No fever.   Patient followed by Dr. Samara Deist and followed with him, patient will be given empiric IV vancomycin, Zosyn.  No signs of sepsis at this time.  Patient afebrile. #2 essential hypertension: Controlled, continue Coreg, amlodipine. 3.  Depression: Continue Cymbalta, BuSpar. 3.  #4 chronic pain patient is on MS Contin 15 mg p.o. twice daily, oxycodone 10 mg 6 times daily so restarted them. 4.  History of BPH, testosterone deficiency: Patient is followed by Dr. Bernardo Heater.  Continue Flomax, testosterone injection once a week.  Constipation: Patient is on daily MiraLAX.   All the records are reviewed and case discussed with ED provider. Management plans discussed with the patient, family and they are in agreement.  CODE STATUS: Full code  TOTAL TIME TAKING CARE OF THIS PATIENT: 55 minutes.    Epifanio Lesches M.D on 08/20/2017 at 3:31 PM  Between 7am to 6pm - Pager - [phone]  After 6pm go to [email or web address] - password [email or web address] [facility]  Office  [phone]  CC: Primary care physician; Lavera Guise, MD  Note: This dictation was prepared with Dragon dictation along with smaller phrase technology. Any transcriptional errors that result from this process are unintentional.

## 2017-08-21 ENCOUNTER — Encounter: Payer: Self-pay | Admitting: Anesthesiology

## 2017-08-21 LAB — CBC
HEMATOCRIT: 41.5 % (ref 40.0–52.0)
Hemoglobin: 14.1 g/dL (ref 13.0–18.0)
MCH: 29 pg (ref 26.0–34.0)
MCHC: 34 g/dL (ref 32.0–36.0)
MCV: 85.3 fL (ref 80.0–100.0)
Platelets: 183 10*3/uL (ref 150–440)
RBC: 4.86 MIL/uL (ref 4.40–5.90)
RDW: 19.7 % — ABNORMAL HIGH (ref 11.5–14.5)
WBC: 7.1 10*3/uL (ref 3.8–10.6)

## 2017-08-21 LAB — BASIC METABOLIC PANEL
Anion gap: 6 (ref 5–15)
BUN: 11 mg/dL (ref 6–20)
CHLORIDE: 104 mmol/L (ref 101–111)
CO2: 29 mmol/L (ref 22–32)
Calcium: 8.3 mg/dL — ABNORMAL LOW (ref 8.9–10.3)
Creatinine, Ser: 0.96 mg/dL (ref 0.61–1.24)
GFR calc non Af Amer: >60 mL/min (ref >60)
Glucose, Bld: 149 mg/dL — ABNORMAL HIGH (ref 65–99)
POTASSIUM: 3.3 mmol/L — AB (ref 3.5–5.1)
SODIUM: 139 mmol/L (ref 135–145)

## 2017-08-21 LAB — GLUCOSE, CAPILLARY: Glucose-Capillary: 135 mg/dL — ABNORMAL HIGH (ref 65–99)

## 2017-08-21 LAB — VANCOMYCIN, TROUGH: Vancomycin Tr: 12 ug/mL — ABNORMAL LOW (ref 15–20)

## 2017-08-21 LAB — C-REACTIVE PROTEIN: CRP: 12.6 mg/dL — ABNORMAL HIGH (ref <1.0)

## 2017-08-21 LAB — MRSA PCR SCREENING: MRSA by PCR: POSITIVE — AB

## 2017-08-21 MED ORDER — POTASSIUM CHLORIDE CRYS ER 20 MEQ PO TBCR
40.0000 meq | EXTENDED_RELEASE_TABLET | ORAL | Status: AC
  Administered 2017-08-21 (×2): 40 meq via ORAL
  Filled 2017-08-21 (×2): qty 2

## 2017-08-21 MED ORDER — CHLORHEXIDINE GLUCONATE CLOTH 2 % EX PADS
6.0000 | MEDICATED_PAD | Freq: Every day | CUTANEOUS | Status: DC
  Administered 2017-08-22 – 2017-08-25 (×4): 6 via TOPICAL

## 2017-08-21 MED ORDER — CHLORHEXIDINE GLUCONATE 4 % EX LIQD: 60.0000 mL | Freq: Once | CUTANEOUS | Status: DC

## 2017-08-21 MED ORDER — HYDROMORPHONE HCL 1 MG/ML IJ SOLN
1.0000 mg | INTRAMUSCULAR | Status: DC | PRN
  Administered 2017-08-21 – 2017-08-23 (×9): 1 mg via INTRAVENOUS
  Filled 2017-08-21 (×9): qty 1

## 2017-08-21 MED ORDER — VANCOMYCIN HCL 10 G IV SOLR: 1500.0000 mg | Freq: Three times a day (TID) | INTRAVENOUS | Status: DC

## 2017-08-21 MED ORDER — SODIUM CHLORIDE 0.9 % IV SOLN
1500.0000 mg | Freq: Three times a day (TID) | INTRAVENOUS | Status: DC
  Administered 2017-08-21 – 2017-08-25 (×12): 1500 mg via INTRAVENOUS
  Filled 2017-08-21 (×16): qty 1500

## 2017-08-21 MED ORDER — TRAMADOL HCL 50 MG PO TABS: 50.0000 mg | ORAL_TABLET | Freq: Four times a day (QID) | ORAL | Status: DC | PRN

## 2017-08-21 MED ORDER — DOCUSATE SODIUM 100 MG PO CAPS
100.0000 mg | ORAL_CAPSULE | Freq: Two times a day (BID) | ORAL | Status: DC
  Administered 2017-08-21 – 2017-08-25 (×8): 100 mg via ORAL
  Filled 2017-08-21 (×8): qty 1

## 2017-08-21 MED ORDER — MUPIROCIN 2 % EX OINT
1.0000 "application " | TOPICAL_OINTMENT | Freq: Two times a day (BID) | CUTANEOUS | Status: DC
  Administered 2017-08-21 – 2017-08-25 (×8): 1 via NASAL
  Filled 2017-08-21: qty 22

## 2017-08-21 NOTE — Progress Notes (Signed)
Patient is AxOx4. Complaints of pain to right foot due to swelling and nerve pain. Scheduled pain meds given as ordered, prn given x 2 this shift. Procedure tentative for 5/5. EKG performed, consent signed and placed in chart.

## 2017-08-21 NOTE — Progress Notes (Signed)
[facility] at [place] NAME: Charles [name]    MR#:  [id number]  DATE OF BIRTH:  [birth year]  SUBJECTIVE:  CHIEF COMPLAINT: Patient is reporting his right leg pain is very severe 9 out of 10 but the redness is improving.  Seen by podiatry and scheduled for surgery tomorrow  REVIEW OF SYSTEMS:  CONSTITUTIONAL: No fever, fatigue or weakness.  EYES: No blurred or double vision.  EARS, NOSE, AND THROAT: No tinnitus or ear pain.  RESPIRATORY: No cough, shortness of breath, wheezing or hemoptysis.  CARDIOVASCULAR: No chest pain, orthopnea, edema.  GASTROINTESTINAL: No nausea, vomiting, diarrhea or abdominal pain.  GENITOURINARY: No dysuria, hematuria.  ENDOCRINE: No polyuria, nocturia,  HEMATOLOGY: No anemia, easy bruising or bleeding SKIN: No rash or lesion. MUSCULOSKELETAL: Right lower leg with less redness but more pain NEUROLOGIC: No tingling, numbness, weakness.  PSYCHIATRY: No anxiety or depression.   DRUG ALLERGIES:   Allergies  Allergen Reactions  . Lisinopril Cough    VITALS:  Blood pressure 140/90, pulse 84, temperature 98.2 F (36.8 C), temperature source Oral, resp. rate 20, height 6\' 1"  (1.854 m), weight 133.8 kg (295 lb), SpO2 95 %.  PHYSICAL EXAMINATION:  GENERAL:  57 y.o.-year-old patient lying in the bed with no acute distress.  EYES: Pupils equal, round, reactive to light and accommodation. No scleral icterus. Extraocular muscles intact.  HEENT: Head atraumatic, normocephalic. Oropharynx and nasopharynx clear.  NECK:  Supple, no jugular venous distention. No thyroid enlargement, no tenderness.  LUNGS: Normal breath sounds bilaterally, no wheezing, rales,rhonchi or crepitation. No use of accessory muscles of respiration.  CARDIOVASCULAR: S1, S2 normal. No murmurs, rubs, or gallops.  ABDOMEN: Soft, nontender, nondistended. Bowel sounds present. No organomegaly or mass.  EXTREMITIES: Right lower extremity and foot with  minimal erythema no discharge but very tender, status post multiple surgeries in the past no pedal edema, cyanosis, or clubbing.  NEUROLOGIC: Cranial nerves II through XII are intact. Muscle strength 5/5 in all extremities except right lower extremity. Sensation intact. Gait not checked.  PSYCHIATRIC: The patient is alert and oriented x 3.  SKIN: No obvious rash, lesion, or ulcer.    LABORATORY PANEL:   CBC Recent Labs  Lab 08/21/17 0402  WBC 7.1  HGB 14.1  HCT 41.5  PLT 183   ------------------------------------------------------------------------------------------------------------------  Chemistries  Recent Labs  Lab 08/20/17 1418  08/21/17 0402  NA 133*  --  139  K 3.6  --  3.3*  CL 100*  --  104  CO2 27  --  29  GLUCOSE 160*  --  149*  BUN 9  --  11  CREATININE 1.06   < > 0.96  CALCIUM 8.3*  --  8.3*  AST 19  --   --   ALT 19  --   --   ALKPHOS 78  --   --   BILITOT 1.1  --   --    < > = values in this interval not displayed.   ------------------------------------------------------------------------------------------------------------------  Cardiac Enzymes No results for input(s): TROPONINI in the last 168 hours. ------------------------------------------------------------------------------------------------------------------  RADIOLOGY:  Dg Foot Complete Right  Result Date: 08/20/2017 CLINICAL DATA:  Foot swelling and redness. EXAM: RIGHT FOOT COMPLETE - 3+ VIEW COMPARISON:  Right foot x-rays dated February 20, 2017. FINDINGS: Prior first metatarsal and first proximal phalanx osteotomies with prominent lucency about the first metatarsal hardware and osseous resorption along the prior osteotomy site. There is a fracture of the dorsal  plate and backing out of several screws. No acute fracture or dislocation. Prior calcaneal osteotomy with single screw fixation. Mild midfoot osteoarthritis. Severe soft tissue swelling along the dorsal and medial forefoot. No  subcutaneous emphysema. IMPRESSION: 1. Prior first metatarsal osteotomy with hardware fracture and loosening, probably related to infection given osseous resorption along the osteotomy site, concerning for osteomyelitis, and prominent medial and dorsal forefoot soft tissue swelling. Electronically Signed   By: Titus Dubin M.D.   On: 08/20/2017 14:22    EKG:   Orders placed or performed during the hospital encounter of 11/17/16  . ED EKG  . ED EKG  . EKG 12-Lead  . EKG 12-Lead    ASSESSMENT AND PLAN:     57 year old male patient with multiple medical problems of hypertension, hypothyroidism chronic pain secondary to multiple right foot surgeries,operations comes in with worsening swelling, redness and pain of the right foot and unable to ambulate for past for 5 days.  #Right foot cellulitis with possible underlying osteomyelitis versus a vascular necrosis given the history of multiple surgeries with hardware in the past Continue IV antibiotics Zosyn and vancomycin Seen by podiatry Dr. Vickki Muff scheduled for surgery tomorrow N.p.o. after midnight Check a.m. Labs Pain management as needed.  Continue home medication MS Contin and oxycodone, in addition to the Dilaudid IV as needed for severe pain and tramadol as needed for mild to moderate pain  # essential hypertension: Controlled, continue Coreg, amlodipine.  #.  Depression: Continue Cymbalta, BuSpar.   #chronic pain patient is on MS Contin 15 mg p.o. twice daily, oxycodone 10 mg 6 times daily so restarted them.  #.  History of BPH, testosterone deficiency: Patient is followed by Dr. Bernardo Heater.  Continue Flomax, testosterone injection once a week.  #Constipation: Patient is on daily MiraLAX.,  Add Colace      All the records are reviewed and case discussed with Care Management/Social Workerr. Management plans discussed with the patient, family and they are in agreement.  CODE STATUS: fc  TOTAL TIME TAKING CARE OF THIS  PATIENT: 35  minutes.   POSSIBLE D/C IN 2-3DAYS, DEPENDING ON CLINICAL CONDITION.  Note: This dictation was prepared with Dragon dictation along with smaller phrase technology. Any transcriptional errors that result from this process are unintentional.   Nicholes Mango M.D on 08/21/2017 at 9:30 AM  Between 7am to 6pm - Pager - [phone] After 6pm go to [email or web address] - password [email or web address] [facility]  [facility]  Office  [phone]  CC: Primary care physician; Lavera Guise, MD

## 2017-08-21 NOTE — Consult Note (Signed)
ORTHOPAEDIC CONSULTATION  REQUESTING PHYSICIAN: Nicholes Mango, MD  Chief Complaint: Right foot pain  HPI: Charles [name] is a 57 y.o. male who complains of worsening pain to his right foot.  Over the last week his pain has progressed to the point he was having severe pain 10 out of 10.  He is well-known to me.  He underwent hallux valgus corrective surgery initially back in the summer.  This displaced and underwent second surgery for stabilization with double plating.  Continued to have worsening effects redness swelling fractured and infection then formed around his surgical site.  Underwent debridement and washout of the wound.  Eventually the wound and closed.  He did have noted malunion of the osteotomy site with displacement.  He is been ambulatory since that time.  He states the pain has definitely progressed at this point.  He states at the time of admission yesterday he had severe redness with cellulitis up his leg.  This is markedly improved today on evaluation.  Past Medical History:  Diagnosis Date  . Anginal pain ([place])   . Anxiety   . Arthritis   . Chronic back pain   . Coronary artery disease   . Depression   . GERD (gastroesophageal reflux disease)   . Hypertension   . Hypothyroidism   . Insomnia   . Low testosterone   . Myocardial infarction ([place])   . Peptic ulcer   . Shortness of breath   . Sleep apnea    has cpap-has not used since lost 140lb  . Spinal cord stimulator status    has a scs  . Wears glasses    Past Surgical History:  Procedure Laterality Date  . BACK SURGERY    . CARDIAC CATHETERIZATION N/A 06/27/2015   Procedure: Left Heart Cath and Coronary Angiography;  Surgeon: Dionisio David, MD;  Location: [facility];  Service: Cardiovascular;  Laterality: N/A;  . CARDIAC CATHETERIZATION N/A 06/27/2015   Procedure: Coronary Stent Intervention;  Surgeon: Yolonda Kida, MD;  Location: [facility];  Service: Cardiovascular;  Laterality: N/A;   . CLOSED REDUCTION NASAL FRACTURE  12/22/2011   Procedure: CLOSED REDUCTION NASAL FRACTURE;  Surgeon: Ascencion Dike, MD;  Location: [facility];  Service: ENT;  Laterality: N/A;  closed reduction of nasal fracture  . FACIAL FRACTURE SURGERY     face-upper jaw with dental implants  . GASTRIC BYPASS  2011   has lost 140lb  . IRRIGATION AND DEBRIDEMENT FOOT Right 02/21/2017   Procedure: IRRIGATION AND DEBRIDEMENT FOOT;  Surgeon: Sharlotte Alamo, DPM;  Location: [facility];  Service: Podiatry;  Laterality: Right;  . LEFT HEART CATH AND CORONARY ANGIOGRAPHY N/A 06/25/2016   Procedure: Left Heart Cath and Coronary Angiography;  Surgeon: Corey Skains, MD;  Location: [facility];  Service: Cardiovascular;  Laterality: N/A;  . METATARSAL OSTEOTOMY Right 02/10/2017   Procedure: METATARSAL OSTEOTOMY-GREAT TOE AND 1ST METATARSAL;  Surgeon: Samara Deist, DPM;  Location: [facility];  Service: Podiatry;  Laterality: Right;  . ORIF TOE FRACTURE Right 02/17/2017   Procedure: Open reduction with internal fixation displaced osteotomy and fracture first metatarsal;  Surgeon: Samara Deist, DPM;  Location: [facility];  Service: Podiatry;  Laterality: Right;  IVA / POPLITEAL  . REPAIR TENDONS FOOT  2002   rt foot  . SPINAL CORD STIMULATOR IMPLANT  6/13   Social History   Socioeconomic History  . Marital status: Married    Spouse name:  Not on file  . Number of children: Not on file  . Years of education: Not on file  . Highest education level: Not on file  Occupational History  . Not on file  Social Needs  . Financial resource strain: Not on file  . Food insecurity:    Worry: Not on file    Inability: Not on file  . Transportation needs:    Medical: Not on file    Non-medical: Not on file  Tobacco Use  . Smoking status: Never Smoker  . Smokeless tobacco: Never Used  Substance and Sexual Activity  . Alcohol use: No    Alcohol/week: 0.0 oz    Comment: not in 4 yr   . Drug use: No  . Sexual activity: Never  Lifestyle  . Physical activity:    Days per week: Not on file    Minutes per session: Not on file  . Stress: Not on file  Relationships  . Social connections:    Talks on phone: Not on file    Gets together: Not on file    Attends religious service: Not on file    Active member of club or organization: Not on file    Attends meetings of clubs or organizations: Not on file    Relationship status: Not on file  Other Topics Concern  . Not on file  Social History Narrative  . Not on file   Family History  Problem Relation Age of Onset  . Diabetes Father    Allergies  Allergen Reactions  . Lisinopril Cough   Prior to Admission medications   Medication Sig Start Date End Date Taking? Authorizing Provider  acidophilus (RISAQUAD) CAPS capsule Take 1 capsule by mouth daily.   Yes [provider]  amLODipine (NORVASC) 5 MG tablet Take 5 mg by mouth daily.   Yes [provider]  aspirin EC 81 MG tablet Take 1 tablet (81 mg total) by mouth daily. 11/18/16  Yes Hillary Bow, MD  atorvastatin (LIPITOR) 40 MG tablet Take 1 tablet (40 mg total) by mouth daily at 6 PM. Patient taking differently: Take 20 mg by mouth daily at 6 PM.  06/28/15  Yes Vaughan Basta, MD  busPIRone (BUSPAR) 15 MG tablet Take 30 mg by mouth 3 (three) times daily.    Yes [provider]  carvedilol (COREG) 6.25 MG tablet Take 1 tablet (6.25 mg total) by mouth 2 (two) times daily with a meal. 11/18/16  Yes Sudini, Srikar, MD  Cyanocobalamin (B-12 PO) Take 1 tablet by mouth daily.   Yes [provider]  DULoxetine (CYMBALTA) 60 MG capsule Take 60 mg by mouth daily.   Yes [provider]  Levomilnacipran HCl ER 40 MG CP24 Take 40 mg by mouth daily.   Yes [provider]  levothyroxine (SYNTHROID, LEVOTHROID) 75 MCG tablet Take 75 mcg by mouth daily before breakfast.    Yes [provider]  lidocaine (LIDODERM)  5 % Place 1 patch onto the skin daily. Remove & Discard patch within 12 hours or as directed by MD   Yes [provider]  morphine (MS CONTIN) 15 MG 12 hr tablet Take 15-30 mg by mouth every 12 (twelve) hours. Pt takes two tablets in the morning and one at night.   Yes [provider]  Multiple Vitamin (MULTIVITAMIN WITH MINERALS) TABS tablet Take 1 tablet by mouth daily.   Yes [provider]  naloxegol oxalate (MOVANTIK) 25 MG TABS tablet Take 25 mg  by mouth daily.   Yes [provider]  ondansetron (ZOFRAN) 8 MG tablet Take 8 mg by mouth 3 (three) times daily.    Yes [provider]  Oxycodone HCl 10 MG TABS Take 10 mg by mouth 6 (six) times daily.   Yes [provider]  pregabalin (LYRICA) 75 MG capsule Take 1 capsule (75 mg total) by mouth 3 (three) times daily. 05/20/17  Yes Boscia, Greer Ee, NP  tamsulosin (FLOMAX) 0.4 MG CAPS capsule Take 1 capsule (0.4 mg total) by mouth daily. 08/05/17  Yes Stoioff, Ronda Fairly, MD  testosterone cypionate (DEPOTESTOSTERONE CYPIONATE) 200 MG/ML injection Inject 0.7 mLs (140 mg total) into the muscle once a week. 08/05/17  Yes Stoioff, Ronda Fairly, MD  zolpidem (AMBIEN) 10 MG tablet Take 1 tablet (10 mg total) by mouth at bedtime. 05/20/17  Yes Boscia, Greer Ee, NP  albuterol (PROVENTIL HFA;VENTOLIN HFA) 108 (90 Base) MCG/ACT inhaler Inhale 2 puffs into the lungs every 6 (six) hours as needed for wheezing or shortness of breath. 08/10/17   Ronnell Freshwater, NP  Diphenhyd-Hydrocort-Nystatin (FIRST-DUKES MOUTHWASH) SUSP Swish and swallow with 35mls QID prn sore throat Patient not taking: Reported on 08/20/2017 07/20/17   Ronnell Freshwater, NP  polyethylene glycol (MIRALAX / GLYCOLAX) packet Take 17 g daily as needed by mouth for mild constipation. Patient not taking: Reported on 08/20/2017 02/25/17   Gladstone Lighter, MD  sildenafil (REVATIO) 20 MG tablet Take 20 mg by mouth 3 (three) times daily.    [provider]    Dg Foot Complete Right  Result Date: 08/20/2017 CLINICAL DATA:  Foot swelling and redness. EXAM: RIGHT FOOT COMPLETE - 3+ VIEW COMPARISON:  Right foot x-rays dated February 20, 2017. FINDINGS: Prior first metatarsal and first proximal phalanx osteotomies with prominent lucency about the first metatarsal hardware and osseous resorption along the prior osteotomy site. There is a fracture of the dorsal plate and backing out of several screws. No acute fracture or dislocation. Prior calcaneal osteotomy with single screw fixation. Mild midfoot osteoarthritis. Severe soft tissue swelling along the dorsal and medial forefoot. No subcutaneous emphysema. IMPRESSION: 1. Prior first metatarsal osteotomy with hardware fracture and loosening, probably related to infection given osseous resorption along the osteotomy site, concerning for osteomyelitis, and prominent medial and dorsal forefoot soft tissue swelling. Electronically Signed   By: Titus Dubin M.D.   On: 08/20/2017 14:22    Positive ROS: All other systems have been reviewed and were otherwise negative with the exception of those mentioned in the HPI and as above.  12 point ROS was performed.  Physical Exam: General: Alert and oriented.  No apparent distress.  Vascular:  Left foot:Dorsalis Pedis:  present Posterior Tibial:  present  Right foot: Dorsalis Pedis:  present Posterior Tibial:  present  Neuro:absent protective sensation  Derm: This point the incision site is closed without open draining.  There is some mild erythema along the previous surgical site.  Cellulitis that was marked has dissipated at this point as well.  Ortho/MS: He has severe noted edema to the right foot.  He has noted dorsal dislocation of the right great toe.  No market instability.  Some pain with range of motion.  X-rays:IMPRESSION: 1. Prior first metatarsal osteotomy with hardware fracture and loosening, probably related to infection given osseous  resorption along the osteotomy site, concerning for osteomyelitis, and prominent medial and dorsal forefoot soft tissue swelling.    Assessment: Acute worsening pain right foot secondary to possible malunion  nonunion with possible osteomyelitis versus avascular necrosis  Plan: He presented with cellulitis that is improving.  I am concerned there may be osteomyelitis to his right foot surgical site.  At this point I recommended removal of the hardware that has fractured throughout the foot.  We will then perform a bone biopsy and bone culture and debridement of any nonviable bone in the region.  He understands this may include marked removal of the first metatarsal head and any of the surrounding joint to this region.  He understands the risks associated with surgery.  We will plan to perform this tomorrow.    Elesa Hacker, DPM Cell [phone]   08/21/2017 9:05 AM

## 2017-08-21 NOTE — Progress Notes (Signed)
Pharmacy Antibiotic Note  Charles [name] is a 57 y.o. male admitted on 08/20/2017 with osteomyelitis.  Pharmacy has been consulted for vancomycin dosing.  Plan: 5/4 1947 VT: 12. Level is subtherapeutic. Will increase dose to Vancomycin 1500 IV every 8 hours. Calculated trough at Css is 15. Trough level prior to 4th dose.   New: Ke: 0.097     T1/2: 7.15  Vd: 93  Continue Zosyn dose is 3.375g EI q8h  Height: 6\' 1"  (185.4 cm) Weight: 295 lb (133.8 kg) IBW/kg (Calculated) : 79.9  Temp (24hrs), Avg:98.3 F (36.8 C), Min:97.8 F (36.6 C), Max:99 F (37.2 C)  Recent Labs  Lab 08/20/17 1418 08/20/17 1556 08/21/17 0402 08/21/17 1947  WBC 10.1 10.7* 7.1  --   CREATININE 1.06 1.05 0.96  --   LATICACIDVEN 1.4  --   --   --   VANCOTROUGH  --   --   --  12*    Estimated Creatinine Clearance: 121.9 mL/min (by C-G formula based on SCr of 0.96 mg/dL).    Allergies  Allergen Reactions  . Lisinopril Cough    Antimicrobials this admission: Vancomycin 5/3 >>  Zosyn 5/3 >>   Microbiology results: Wound 5/3  Thank you for allowing pharmacy to be a part of this patient's care.  Pernell Dupre, PharmD, BCPS Clinical Pharmacist 08/21/2017 8:28 PM

## 2017-08-21 NOTE — Progress Notes (Signed)
Family Meeting Note  Advance Directive:yes  Today a meeting took place with the Patient.    The following clinical team members were present during this meeting:MD  The following were discussed:Patient's diagnosis: Right foot vascular necrosis versus osteomyelitis with cellulitis and other comorbidities  . Anginal pain ([place])   . Anxiety   . Arthritis   . Chronic back pain   . Coronary artery disease   . Depression   . GERD (gastroesophageal reflux disease)   . Hypertension   . Hypothyroidism   . Insomnia   . Low testosterone   . Myocardial infarction ([place])   . Peptic ulcer   . Shortness of breath   . Sleep apnea    has cpap-has not used since lost 140lb  . Spinal cord stimulator status    has a scs  . Wears glasses        , Patient's progosis: Unable to determine and Goals for treatment: Full Code, wife is HCPOA  Additional follow-up to be provided: HOSPITALIST,  Podiatry Time spent during discussion:18 min  Nicholes Mango, MD

## 2017-08-22 ENCOUNTER — Inpatient Hospital Stay: Payer: Self-pay

## 2017-08-22 ENCOUNTER — Inpatient Hospital Stay: Payer: BLUE CROSS/BLUE SHIELD | Admitting: Anesthesiology

## 2017-08-22 ENCOUNTER — Encounter
Admission: EM | Disposition: A | Discharge status: Home Health | Payer: Self-pay | Source: Home / Self Care | Attending: Internal Medicine

## 2017-08-22 HISTORY — PX: IRRIGATION AND DEBRIDEMENT FOOT: SHX6602

## 2017-08-22 LAB — CBC
HCT: 44.2 % (ref 40.0–52.0)
Hemoglobin: 14.6 g/dL (ref 13.0–18.0)
MCH: 28.4 pg (ref 26.0–34.0)
MCHC: 33.1 g/dL (ref 32.0–36.0)
MCV: 85.7 fL (ref 80.0–100.0)
Platelets: 204 10*3/uL (ref 150–440)
RBC: 5.16 MIL/uL (ref 4.40–5.90)
RDW: 19.3 % — ABNORMAL HIGH (ref 11.5–14.5)
WBC: 4.6 10*3/uL (ref 3.8–10.6)

## 2017-08-22 LAB — GLUCOSE, CAPILLARY
Glucose-Capillary: 92 mg/dL (ref 65–99)
Glucose-Capillary: 91 mg/dL (ref 65–99)

## 2017-08-22 SURGERY — IRRIGATION AND DEBRIDEMENT FOOT
Anesthesia: General

## 2017-08-22 MED ORDER — FENTANYL CITRATE (PF) 100 MCG/2ML IJ SOLN
INTRAMUSCULAR | Status: AC
  Filled 2017-08-22: qty 2

## 2017-08-22 MED ORDER — ONDANSETRON HCL 4 MG/2ML IJ SOLN
INTRAMUSCULAR | Status: AC
  Filled 2017-08-22: qty 2

## 2017-08-22 MED ORDER — ONDANSETRON HCL 4 MG/2ML IJ SOLN
INTRAMUSCULAR | Status: DC | PRN
  Administered 2017-08-22: 4 mg via INTRAVENOUS

## 2017-08-22 MED ORDER — VANCOMYCIN HCL 1000 MG IV SOLR
INTRAVENOUS | Status: AC
  Filled 2017-08-22: qty 1000

## 2017-08-22 MED ORDER — PROPOFOL 10 MG/ML IV BOLUS
INTRAVENOUS | Status: DC | PRN
  Administered 2017-08-22: 200 mg via INTRAVENOUS

## 2017-08-22 MED ORDER — LIDOCAINE HCL (PF) 2 % IJ SOLN
INTRAMUSCULAR | Status: AC
  Filled 2017-08-22: qty 10

## 2017-08-22 MED ORDER — LIDOCAINE-EPINEPHRINE 1 %-1:100000 IJ SOLN
INTRAMUSCULAR | Status: AC
  Filled 2017-08-22: qty 1

## 2017-08-22 MED ORDER — ONDANSETRON HCL 4 MG/2ML IJ SOLN: 4.0000 mg | Freq: Once | INTRAMUSCULAR | Status: DC | PRN

## 2017-08-22 MED ORDER — FENTANYL CITRATE (PF) 100 MCG/2ML IJ SOLN
INTRAMUSCULAR | Status: DC | PRN
  Administered 2017-08-22 (×2): 50 ug via INTRAVENOUS

## 2017-08-22 MED ORDER — BUPIVACAINE HCL (PF) 0.5 % IJ SOLN
INTRAMUSCULAR | Status: AC
  Filled 2017-08-22: qty 30

## 2017-08-22 MED ORDER — PROPOFOL 10 MG/ML IV BOLUS
INTRAVENOUS | Status: AC
  Filled 2017-08-22: qty 20

## 2017-08-22 MED ORDER — MIDAZOLAM HCL 2 MG/2ML IJ SOLN
INTRAMUSCULAR | Status: AC
  Filled 2017-08-22: qty 2

## 2017-08-22 MED ORDER — BUPIVACAINE HCL (PF) 0.25 % IJ SOLN
INTRAMUSCULAR | Status: AC
  Filled 2017-08-22: qty 30

## 2017-08-22 MED ORDER — METHYLENE BLUE 0.5 % INJ SOLN
INTRAVENOUS | Status: AC
  Filled 2017-08-22: qty 10

## 2017-08-22 MED ORDER — FENTANYL CITRATE (PF) 100 MCG/2ML IJ SOLN
25.0000 ug | INTRAMUSCULAR | Status: DC | PRN
  Administered 2017-08-22 (×4): 25 ug via INTRAVENOUS

## 2017-08-22 MED ORDER — GLYCOPYRROLATE 0.2 MG/ML IJ SOLN
INTRAMUSCULAR | Status: AC
  Filled 2017-08-22: qty 1

## 2017-08-22 MED ORDER — GLYCOPYRROLATE 0.2 MG/ML IJ SOLN
INTRAMUSCULAR | Status: DC | PRN
  Administered 2017-08-22: 0.2 mg via INTRAVENOUS

## 2017-08-22 MED ORDER — LIDOCAINE HCL (CARDIAC) PF 100 MG/5ML IV SOSY
PREFILLED_SYRINGE | INTRAVENOUS | Status: DC | PRN
  Administered 2017-08-22: 100 mg via INTRAVENOUS

## 2017-08-22 MED ORDER — LACTATED RINGERS IV SOLN
INTRAVENOUS | Status: DC | PRN
  Administered 2017-08-22: 09:00:00 via INTRAVENOUS

## 2017-08-22 MED ORDER — MIDAZOLAM HCL 2 MG/2ML IJ SOLN
INTRAMUSCULAR | Status: DC | PRN
  Administered 2017-08-22: 2 mg via INTRAVENOUS

## 2017-08-22 SURGICAL SUPPLY — 61 items
BANDAGE ACE 4X5 VEL STRL LF (GAUZE/BANDAGES/DRESSINGS) ×2 IMPLANT
BLADE OSC/SAGITTAL MD 5.5X18 (BLADE) ×2 IMPLANT
BLADE OSCILLATING/SAGITTAL (BLADE)
BLADE SW THK.38XMED LNG THN (BLADE) IMPLANT
BNDG COHESIVE 4X5 TAN STRL (GAUZE/BANDAGES/DRESSINGS) ×2 IMPLANT
BNDG COHESIVE 6X5 TAN STRL LF (GAUZE/BANDAGES/DRESSINGS) ×2 IMPLANT
BNDG CONFORM 3 STRL LF (GAUZE/BANDAGES/DRESSINGS) ×2 IMPLANT
BNDG ESMARK 4X12 TAN STRL LF (GAUZE/BANDAGES/DRESSINGS) ×2 IMPLANT
BNDG GAUZE 4.5X4.1 6PLY STRL (MISCELLANEOUS) ×2 IMPLANT
CANISTER SUCT 1200ML W/VALVE (MISCELLANEOUS) ×2 IMPLANT
CANISTER SUCT 3000ML PPV (MISCELLANEOUS) ×2 IMPLANT
CUFF TOURN 18 STER (MISCELLANEOUS) IMPLANT
CUFF TOURN DUAL PL 12 NO SLV (MISCELLANEOUS) ×2 IMPLANT
DRAPE FLUOR MINI C-ARM 54X84 (DRAPES) ×2 IMPLANT
DRAPE XRAY CASSETTE 23X24 (DRAPES) IMPLANT
DRESSING ALLEVYN 4X4 (MISCELLANEOUS) IMPLANT
DURAPREP 26ML APPLICATOR (WOUND CARE) ×2 IMPLANT
ELECT REM PT RETURN 9FT ADLT (ELECTROSURGICAL) ×2
ELECTRODE REM PT RTRN 9FT ADLT (ELECTROSURGICAL) ×1 IMPLANT
GAUZE PACKING 1/4 X5 YD (GAUZE/BANDAGES/DRESSINGS) ×2 IMPLANT
GAUZE PACKING IODOFORM 1/2 (PACKING) ×2 IMPLANT
GAUZE PACKING IODOFORM 1X5 (MISCELLANEOUS) ×2 IMPLANT
GAUZE PETRO XEROFOAM 1X8 (MISCELLANEOUS) ×2 IMPLANT
GAUZE SPONGE 4X4 12PLY STRL (GAUZE/BANDAGES/DRESSINGS) ×2 IMPLANT
GAUZE STRETCH 2X75IN STRL (MISCELLANEOUS) ×2 IMPLANT
GLOVE BIO SURGEON STRL SZ7.5 (GLOVE) ×6 IMPLANT
GLOVE INDICATOR 8.0 STRL GRN (GLOVE) ×2 IMPLANT
GOWN STRL REUS W/ TWL LRG LVL3 (GOWN DISPOSABLE) ×2 IMPLANT
GOWN STRL REUS W/TWL LRG LVL3 (GOWN DISPOSABLE) ×2
GOWN STRL REUS W/TWL MED LVL3 (GOWN DISPOSABLE) ×4 IMPLANT
HANDPIECE VERSAJET DEBRIDEMENT (MISCELLANEOUS) IMPLANT
IV NS 1000ML (IV SOLUTION)
IV NS 1000ML BAXH (IV SOLUTION) IMPLANT
KIT STIMULAN RAPID CURE 5CC (Orthopedic Implant) ×2 IMPLANT
KIT TURNOVER KIT A (KITS) ×2 IMPLANT
LABEL OR SOLS (LABEL) ×2 IMPLANT
NEEDLE FILTER BLUNT 18X 1/2SAF (NEEDLE) ×1
NEEDLE FILTER BLUNT 18X1 1/2 (NEEDLE) ×1 IMPLANT
NEEDLE HYPO 25X1 1.5 SAFETY (NEEDLE) ×2 IMPLANT
NS IRRIG 500ML POUR BTL (IV SOLUTION) ×4 IMPLANT
PACK EXTREMITY ARMC (MISCELLANEOUS) ×2 IMPLANT
PAD ABD DERMACEA PRESS 5X9 (GAUZE/BANDAGES/DRESSINGS) ×2 IMPLANT
PULSAVAC PLUS IRRIG FAN TIP (DISPOSABLE) ×2
RASP SM TEAR CROSS CUT (RASP) IMPLANT
SHIELD FULL FACE ANTIFOG 7M (MISCELLANEOUS) ×2 IMPLANT
SOL .9 NS 3000ML IRR  AL (IV SOLUTION)
SOL .9 NS 3000ML IRR UROMATIC (IV SOLUTION) IMPLANT
SOL PREP PVP 2OZ (MISCELLANEOUS) ×2
SOLUTION PREP PVP 2OZ (MISCELLANEOUS) ×1 IMPLANT
STOCKINETTE IMPERVIOUS 9X36 MD (GAUZE/BANDAGES/DRESSINGS) ×2 IMPLANT
SUT ETHILON 2 0 FS 18 (SUTURE) ×4 IMPLANT
SUT ETHILON 4-0 (SUTURE)
SUT ETHILON 4-0 FS2 18XMFL BLK (SUTURE)
SUT VIC AB 3-0 SH 27 (SUTURE) ×1
SUT VIC AB 3-0 SH 27X BRD (SUTURE) ×1 IMPLANT
SUT VIC AB 4-0 FS2 27 (SUTURE) IMPLANT
SUTURE ETHLN 4-0 FS2 18XMF BLK (SUTURE) IMPLANT
SWAB CULTURE AMIES ANAERIB BLU (MISCELLANEOUS) ×2 IMPLANT
SYR 10ML LL (SYRINGE) ×4 IMPLANT
SYR 3ML LL SCALE MARK (SYRINGE) ×2 IMPLANT
TIP FAN IRRIG PULSAVAC PLUS (DISPOSABLE) ×1 IMPLANT

## 2017-08-22 NOTE — Progress Notes (Signed)
Debridment performed today. Will need IV abx for likely osteomyelitis. Will order PICC and consult ID.

## 2017-08-22 NOTE — Progress Notes (Signed)
[facility] NAME: Charles [name]    MR#:  [id number]  DATE OF BIRTH:  March 02, 1961  SUBJECTIVE:  CHIEF COMPLAINT: Patient had debridment today, seen prior to surgery  REVIEW OF SYSTEMS:  CONSTITUTIONAL: No fever, fatigue or weakness.  EYES: No blurred or double vision.  EARS, NOSE, AND THROAT: No tinnitus or ear pain.  RESPIRATORY: No cough, shortness of breath, wheezing or hemoptysis.  CARDIOVASCULAR: No chest pain, orthopnea, edema.  GASTROINTESTINAL: No nausea, vomiting, diarrhea or abdominal pain.  GENITOURINARY: No dysuria, hematuria.  ENDOCRINE: No polyuria, nocturia,  HEMATOLOGY: No anemia, easy bruising or bleeding SKIN: No rash or lesion. MUSCULOSKELETAL: Right lower leg with redness NEUROLOGIC: No tingling, numbness, weakness.  PSYCHIATRY: No anxiety or depression.   DRUG ALLERGIES:   Allergies  Allergen Reactions  . Lisinopril Cough    VITALS:  Blood pressure (!) 150/88, pulse 88, temperature 98.9 F (37.2 C), temperature source Oral, resp. rate 20, height 6\' 1"  (1.854 m), weight 132.5 kg (292 lb), SpO2 96 %.  PHYSICAL EXAMINATION:  GENERAL:  57 y.o.-year-old patient lying in the bed with no acute distress.  EYES: Pupils equal, round, reactive to light and accommodation. No scleral icterus. Extraocular muscles intact.  HEENT: Head atraumatic, normocephalic. Oropharynx and nasopharynx clear.  NECK:  Supple, no jugular venous distention. No thyroid enlargement, no tenderness.  LUNGS: Normal breath sounds bilaterally, no wheezing, rales,rhonchi or crepitation. No use of accessory muscles of respiration.  CARDIOVASCULAR: S1, S2 normal. No murmurs, rubs, or gallops.  ABDOMEN: Soft, nontender, nondistended. Bowel sounds present. No organomegaly or mass.  EXTREMITIES: Right lower extremity and foot with minimal erythema no discharge but very tender, status post multiple surgeries in the past no pedal edema,  cyanosis, or clubbing.  NEUROLOGIC: Cranial nerves II through XII are intact. Muscle strength 5/5 in all extremities except right lower extremity. Sensation intact. Gait not checked.  PSYCHIATRIC: The patient is alert and oriented x 3.  SKIN: No obvious rash, lesion, or ulcer.    LABORATORY PANEL:   CBC Recent Labs  Lab 08/22/17 0332  WBC 4.6  HGB 14.6  HCT 44.2  PLT 204   ------------------------------------------------------------------------------------------------------------------  Chemistries  Recent Labs  Lab 08/20/17 1418  08/21/17 0402  NA 133*  --  139  K 3.6  --  3.3*  CL 100*  --  104  CO2 27  --  29  GLUCOSE 160*  --  149*  BUN 9  --  11  CREATININE 1.06   < > 0.96  CALCIUM 8.3*  --  8.3*  AST 19  --   --   ALT 19  --   --   ALKPHOS 78  --   --   BILITOT 1.1  --   --    < > = values in this interval not displayed.   ------------------------------------------------------------------------------------------------------------------  Cardiac Enzymes No results for input(s): TROPONINI in the last 168 hours. ------------------------------------------------------------------------------------------------------------------  RADIOLOGY:  [place] Ekg Site Rite  Result Date: 08/22/2017 If Site Rite image not attached, placement could not be confirmed due to current cardiac rhythm.   EKG:   Orders placed or performed during the hospital encounter of 08/20/17  . EKG 12-Lead  . EKG 12-Lead  . EKG 12-Lead  . EKG 12-Lead  . EKG 12-Lead  . EKG 12-Lead    ASSESSMENT AND PLAN:     57 year old male patient with multiple medical problems of hypertension, hypothyroidism chronic pain secondary  to multiple right foot surgeries,operations comes in with worsening swelling, redness and pain of the right foot and unable to ambulate for past for 5 days.  #Right foot cellulitis with possible underlying osteomyelitis versus a vascular necrosis given the history of multiple  surgeries with hardware in the past Continue IV antibiotics Zosyn and vancomycin Seen by podiatry Dr. Vickki Muff did debridment today: 1.  I&D with excision of distal first metatarsal head 2.  Removal of retained hardware right foot 3.  Extensor hallucis longus tendon lengthening with capsulotomy right first MTPJ  All right foot PICC line ordered and ID consult placed  Pain management as needed.  Continue home medication MS Contin and oxycodone, in addition to the Dilaudid IV as needed for severe pain and tramadol as needed for mild to moderate pain  # essential hypertension: Controlled, continue Coreg, amlodipine.  #.  Depression: Continue Cymbalta, BuSpar.   #chronic pain patient is on MS Contin 15 mg p.o. twice daily, oxycodone 10 mg 6 times daily so restarted them.  #.  History of BPH, testosterone deficiency: Patient is followed by Dr. Bernardo Heater.  Continue Flomax, testosterone injection once a week.  #Constipation: Patient is on daily MiraLAX.,  Add Colace      All the records are reviewed and case discussed with Care Management/Social Workerr. Management plans discussed with the patient, family and they are in agreement.  CODE STATUS: fc  TOTAL TIME TAKING CARE OF THIS PATIENT: 35  minutes.   POSSIBLE D/C IN 2-3DAYS, DEPENDING ON CLINICAL CONDITION.  Note: This dictation was prepared with Dragon dictation along with smaller phrase technology. Any transcriptional errors that result from this process are unintentional.   Nicholes Mango M.D on 08/22/2017 at 5:10 PM  Between 7am to 6pm - Pager - [phone] After 6pm go to [email or web address] - password [email or web address] [facility]  [facility]  Office  [phone]  CC: Primary care physician; Lavera Guise, MD

## 2017-08-22 NOTE — Progress Notes (Signed)
Spoke with RN re PICC placement for d/c home.  No d/c plans as of this time. ID to follow.  PICC not to be inserted today.

## 2017-08-22 NOTE — Anesthesia Postprocedure Evaluation (Signed)
Anesthesia Post Note  Patient: Charles [name]  Procedure(s) Performed: IRRIGATION AND DEBRIDEMENT FOOT and hardware removal (N/A )  Patient location during evaluation: PACU Anesthesia Type: General Level of consciousness: awake and alert Pain management: pain level controlled Vital Signs Assessment: post-procedure vital signs reviewed and stable Respiratory status: spontaneous breathing, nonlabored ventilation, respiratory function stable and patient connected to nasal cannula oxygen Cardiovascular status: blood pressure returned to baseline and stable Postop Assessment: no apparent nausea or vomiting Anesthetic complications: no     Last Vitals:  Vitals:   08/22/17 1254 08/22/17 1452  BP: 137/89 139/77  Pulse: 93 92  Resp: 19 18  Temp: 37 C 36.9 C  SpO2: 96% 96%    Last Pain:  Vitals:   08/22/17 1453  TempSrc:   PainSc: 10-Worst pain ever                 Sheriff Rodenberg S

## 2017-08-22 NOTE — Anesthesia Post-op Follow-up Note (Signed)
Anesthesia QCDR form completed.        

## 2017-08-22 NOTE — Transfer of Care (Signed)
Immediate Anesthesia Transfer of Care Note  Patient: JANIS CUFFE  Procedure(s) Performed: IRRIGATION AND DEBRIDEMENT FOOT and hardware removal (N/A )  Patient Location: PACU  Anesthesia Type:General  Level of Consciousness: awake and alert   Airway & Oxygen Therapy: Patient Spontanous Breathing and Patient connected to face mask oxygen  Post-op Assessment: Report given to RN and Post -op Vital signs reviewed and stable  Post vital signs: Reviewed and stable  Last Vitals:  Vitals Value Taken Time  BP 141/88 08/22/2017 11:09 AM  Temp 37.3 C 08/22/2017 11:08 AM  Pulse 89 08/22/2017 11:09 AM  Resp 14 08/22/2017 11:09 AM  SpO2 99 % 08/22/2017 11:09 AM    Last Pain:  Vitals:   08/22/17 0756  TempSrc: Oral  PainSc:       Patients Stated Pain Goal: 4 (84/78/41 2820)  Complications: No apparent anesthesia complications

## 2017-08-22 NOTE — Anesthesia Preprocedure Evaluation (Signed)
Anesthesia Evaluation  Patient identified by MRN, date of birth, ID band Patient awake    Reviewed: Allergy & Precautions, NPO status , Patient's Chart, lab work & pertinent test results, reviewed documented beta blocker date and time   Airway Mallampati: III  TM Distance: >3 FB     Dental  (+) Chipped, Partial Upper   Pulmonary shortness of breath, asthma , sleep apnea ,           Cardiovascular hypertension, Pt. on medications and Pt. on home beta blockers + angina + CAD, + Past MI and + Cardiac Stents       Neuro/Psych PSYCHIATRIC DISORDERS Anxiety Depression  Neuromuscular disease    GI/Hepatic PUD, GERD  Controlled,  Endo/Other  diabetes, Type 2Hypothyroidism   Renal/GU Renal disease     Musculoskeletal   Abdominal   Peds  Hematology  (+) anemia ,   Anesthesia Other Findings Obese. He has turned off his spinal cord stim. Has had a gastric bypass. Does not have problems with reflux. He states he does not need to take prilosec. EKG no change. Echo in the past showed 50%.  Reproductive/Obstetrics                             Anesthesia Physical Anesthesia Plan  ASA: III  Anesthesia Plan: General   Post-op Pain Management:    Induction: Intravenous  PONV Risk Score and Plan:   Airway Management Planned: LMA and Oral ETT  Additional Equipment:   Intra-op Plan:   Post-operative Plan:   Informed Consent: I have reviewed the patients History and Physical, chart, labs and discussed the procedure including the risks, benefits and alternatives for the proposed anesthesia with the patient or authorized representative who has indicated his/her understanding and acceptance.     Plan Discussed with: CRNA  Anesthesia Plan Comments:         Anesthesia Quick Evaluation

## 2017-08-22 NOTE — Anesthesia Procedure Notes (Signed)
Procedure Name: LMA Insertion Performed by: Jaymere Alen, CRNA Pre-anesthesia Checklist: Patient identified, Patient being monitored, Timeout performed, Emergency Drugs available and Suction available Patient Re-evaluated:Patient Re-evaluated prior to induction Oxygen Delivery Method: Circle system utilized Preoxygenation: Pre-oxygenation with 100% oxygen Induction Type: IV induction LMA: LMA inserted LMA Size: 4.5 Tube type: Oral Number of attempts: 1 Placement Confirmation: positive ETCO2 and breath sounds checked- equal and bilateral Tube secured with: Tape Dental Injury: Teeth and Oropharynx as per pre-operative assessment        

## 2017-08-22 NOTE — Op Note (Addendum)
Operative note   Surgeon:Ermon Sagan Lawyer: None    Preop diagnosis: 1 right foot infection with retained hardware first metatarsal    Postop diagnosis: 1.  Abscess with osteomyelitis first metatarsal head 2.  Retained fractured hardware right metatarsal 3.  Dislocated first metatarsophalangeal joint    Procedure: 1.  I&D with excision of distal first metatarsal head 2.  Removal of retained hardware right foot 3.  Extensor hallucis longus tendon lengthening with capsulotomy right first MTPJ  All right foot.    EBL: Minimal    Anesthesia:general    Hemostasis: Ankle tourniquet inflated to 200 mmHg for approximately 90 minutes    Specimen: Deep wound culture with swab.  Bone culture.  Bone for pathology    Complications: None    Operative indications:Wood S Qu is an 57 y.o. that presents today for surgical intervention.  The risks/benefits/alternatives/complications have been discussed and consent has been given.    Procedure:  Patient was brought into the OR and placed on the operating table in thesupine position. After anesthesia was obtained theright lower extremity was prepped and draped in usual sterile fashion.  A dorsal medial incision was made along the first metatarsal to the metatarsophalangeal joint.  Upon initial incision through the superficial skin there was noted purulent drainage.  This was flushed and a deep wound culture was performed with a swab.  Further dissection carried down to the bone.  Subperiosteal dissection was then undertaken.  At this point there was noted to be hardware failure of the residual dorsal and medial plates.  The dorsal plate was fractured through and through.  There was marketed instability of the metatarsal head with erosive changes.  The purulence was noted from the nonunion site with fibrotic residual tissue in this area.  All hardware was then removed from the surgical field in toto.  The residual metatarsal head was noted to be  eroded and at this time I was able to remove the distal metatarsal head.  Noted fibrotic tissue along the metatarsal shaft was found as well as plates had eroded into the mid shaft area.  This was debrided with a curette into the bone.  This was taken down to good healthy bleeding bone.  A small staple was found in the proximal phalanx and this was removed as well.  Proximal phalanx seems firm without any erosive changes.  At this time the distal aspect of the first metatarsal was cut with a power soft to good healthy bleeding bone.  The wound was flushed with copious amounts of irrigation at this time.  There was still noted to be dorsal dislocation of the great toe.  The capsule was fully released with residual dorsal dislocation.  At this time the long extensor tendon was noted and a extensor hallucis longus lengthening procedure was performed.  The toe was brought back into a better position with just mild dorsal subluxation.  After final flushing of the wound the metatarsal and shaft of the metatarsal was packed with stimulant and bone packing that was impregnated with vancomycin.  This adhered into the bone and medullary canal quite well.  The extensor tendon was repaired with a 3-0 Vicryl.  This was placed back into its sheath.  The skin was then closed with a 2-0 nylon.  The proximal aspect was left open and packed with iodoform impregnated packing strip.  A large bulky sterile dressing was then applied.    Patient tolerated the procedure and anesthesia well.  Was transported from the OR to the PACU with all vital signs stable and vascular status intact. To be discharged per routine protocol.  Will follow up in approximately 1 week in the outpatient clinic.

## 2017-08-23 ENCOUNTER — Encounter: Payer: Self-pay | Admitting: Podiatry

## 2017-08-23 LAB — CBC
HCT: 42.1 % (ref 40.0–52.0)
Hemoglobin: 14.3 g/dL (ref 13.0–18.0)
MCH: 29.2 pg (ref 26.0–34.0)
MCHC: 34 g/dL (ref 32.0–36.0)
MCV: 85.8 fL (ref 80.0–100.0)
Platelets: 215 10*3/uL (ref 150–440)
RBC: 4.91 MIL/uL (ref 4.40–5.90)
RDW: 19.2 % — ABNORMAL HIGH (ref 11.5–14.5)
WBC: 4.6 10*3/uL (ref 3.8–10.6)

## 2017-08-23 LAB — BASIC METABOLIC PANEL
Anion gap: 9 (ref 5–15)
BUN: 8 mg/dL (ref 6–20)
CHLORIDE: 101 mmol/L (ref 101–111)
CO2: 26 mmol/L (ref 22–32)
CREATININE: 1.02 mg/dL (ref 0.61–1.24)
Calcium: 8.1 mg/dL — ABNORMAL LOW (ref 8.9–10.3)
GFR calc Af Amer: >60 mL/min (ref >60)
GFR calc non Af Amer: >60 mL/min (ref >60)
GLUCOSE: 163 mg/dL — AB (ref 65–99)
Potassium: 3.5 mmol/L (ref 3.5–5.1)
SODIUM: 136 mmol/L (ref 135–145)

## 2017-08-23 LAB — GLUCOSE, CAPILLARY: Glucose-Capillary: 112 mg/dL — ABNORMAL HIGH (ref 65–99)

## 2017-08-23 LAB — VANCOMYCIN, TROUGH: VANCOMYCIN TR: 16 ug/mL (ref 15–20)

## 2017-08-23 MED ORDER — SODIUM CHLORIDE 0.9% FLUSH: 10.0000 mL | INTRAVENOUS | Status: DC | PRN

## 2017-08-23 MED ORDER — SODIUM CHLORIDE 0.9% FLUSH
10.0000 mL | Freq: Two times a day (BID) | INTRAVENOUS | Status: DC
  Administered 2017-08-23 – 2017-08-24 (×2): 30 mL
  Administered 2017-08-25: 40 mL

## 2017-08-23 MED ORDER — BUSPIRONE HCL 15 MG PO TABS
30.0000 mg | ORAL_TABLET | Freq: Three times a day (TID) | ORAL | Status: DC
  Administered 2017-08-23 – 2017-08-25 (×6): 30 mg via ORAL
  Filled 2017-08-23 (×8): qty 2

## 2017-08-23 MED ORDER — HYDROMORPHONE HCL 1 MG/ML IJ SOLN
2.0000 mg | INTRAMUSCULAR | Status: DC | PRN
  Administered 2017-08-23 – 2017-08-24 (×3): 2 mg via INTRAVENOUS
  Filled 2017-08-23 (×3): qty 2

## 2017-08-23 NOTE — Consult Note (Signed)
[facility]     Reason for Consult: Osteomyelitis   Referring Physician: Gouru, A Date of Admission:  08/20/2017   Active Problems:   Osteomyelitis ([place])   HPI: Charles [name] is a 57 y.o. male admitted with R foot pain and swelling over last 3-4 days. He was found to have infection  He was seen in Nov 2018 with infection of the post op site of R great toe. He originally had undergone surgery on October 24 for correction of her right foot hallux varus. However he injured the surgical site and displaced his right great toe joint. He had to undergo repeat surgery October 31 with ORIF of the displaced osteotomy and placement of metal hardware. He was admitted on November 2 with increased bleeding and redness and infection at the site. On November 4 Dr. Caryl Comes performed an I&D with antibiotic bead placement. Cultures grew enterococcus and Enterobacter. He was also found to have borderline diabetes with A1c of 6.1. He did well post op after treatment with IV unasyn and oral ciprofloxacin. After 2 weeks IV abx he was changed to augmentin and cipro fro another 3 weeks and seemed to heal up.  He however did have continued swelling in the foot and has hardware in place.  On this admission wbc was 10 and no fevers. ESR 15, CRp 12.6 MRSA PCR +.   He underwent surgery 5/5 with abscess with OM of 1st MT head and retained fxed hardware.  Cx pending.     Past Medical History:  Diagnosis Date  . Anginal pain ([place])   . Anxiety   . Arthritis   . Chronic back pain   . Coronary artery disease   . Depression   . GERD (gastroesophageal reflux disease)   . Hypertension   . Hypothyroidism   . Insomnia   . Low testosterone   . Myocardial infarction ([place])   . Peptic ulcer   . Shortness of breath   . Sleep apnea    has cpap-has not used since lost 140lb  . Spinal cord stimulator status    has a scs  . Wears glasses    Past Surgical History:  Procedure Laterality Date  . BACK  SURGERY    . CARDIAC CATHETERIZATION N/A 06/27/2015   Procedure: Left Heart Cath and Coronary Angiography;  Surgeon: Dionisio , MD;  Location: [facility];  Service: Cardiovascular;  Laterality: N/A;  . CARDIAC CATHETERIZATION N/A 06/27/2015   Procedure: Coronary Stent Intervention;  Surgeon: Yolonda Kida, MD;  Location: [facility];  Service: Cardiovascular;  Laterality: N/A;  . CLOSED REDUCTION NASAL FRACTURE  12/22/2011   Procedure: CLOSED REDUCTION NASAL FRACTURE;  Surgeon: Ascencion Dike, MD;  Location: [place];  Service: ENT;  Laterality: N/A;  closed reduction of nasal fracture  . FACIAL FRACTURE SURGERY     face-upper jaw with dental implants  . GASTRIC BYPASS  2011   has lost 140lb  . IRRIGATION AND DEBRIDEMENT FOOT Right 02/21/2017   Procedure: IRRIGATION AND DEBRIDEMENT FOOT;  Surgeon: Sharlotte Alamo, DPM;  Location: [facility];  Service: Podiatry;  Laterality: Right;  . IRRIGATION AND DEBRIDEMENT FOOT N/A 08/22/2017   Procedure: IRRIGATION AND DEBRIDEMENT FOOT and hardware removal;  Surgeon: Samara Deist, DPM;  Location: [facility];  Service: Podiatry;  Laterality: N/A;  . LEFT HEART CATH AND CORONARY ANGIOGRAPHY N/A 06/25/2016   Procedure: Left Heart Cath and Coronary Angiography;  Surgeon: Corey Skains, MD;  Location: [place] [facility];  Service: Cardiovascular;  Laterality: N/A;  . METATARSAL OSTEOTOMY Right 02/10/2017   Procedure: METATARSAL OSTEOTOMY-GREAT TOE AND 1ST METATARSAL;  Surgeon: Samara Deist, DPM;  Location: [place];  Service: Podiatry;  Laterality: Right;  . ORIF TOE FRACTURE Right 02/17/2017   Procedure: Open reduction with internal fixation displaced osteotomy and fracture first metatarsal;  Surgeon: Samara Deist, DPM;  Location: [place];  Service: Podiatry;  Laterality: Right;  IVA / POPLITEAL  . REPAIR TENDONS FOOT  2002   rt foot  . SPINAL CORD STIMULATOR IMPLANT  6/13   Social History   Tobacco  Use  . Smoking status: Never Smoker  . Smokeless tobacco: Never Used  Substance Use Topics  . Alcohol use: No    Alcohol/week: 0.0 oz    Comment: not in 4 yr  . Drug use: No   Family History  Problem Relation Age of Onset  . Diabetes Father     Allergies:  Allergies  Allergen Reactions  . Lisinopril Cough    Current antibiotics: Antibiotics Given (last 72 hours)    Date/Time Action Medication Dose Rate   08/20/17 2000 New Bag/Given   vancomycin (VANCOCIN) 1,250 mg in sodium chloride 0.9 % 250 mL IVPB 1,250 mg 166.7 mL/hr   08/20/17 2109 New Bag/Given   piperacillin-tazobactam (ZOSYN) IVPB 3.375 g 3.375 g 12.5 mL/hr   08/21/17 0344 New Bag/Given   vancomycin (VANCOCIN) 1,250 mg in sodium chloride 0.9 % 250 mL IVPB 1,250 mg 166.7 mL/hr   08/21/17 0528 New Bag/Given   piperacillin-tazobactam (ZOSYN) IVPB 3.375 g 3.375 g 12.5 mL/hr   08/21/17 1233 New Bag/Given   vancomycin (VANCOCIN) 1,250 mg in sodium chloride 0.9 % 250 mL IVPB 1,250 mg 166.7 mL/hr   08/21/17 1543 New Bag/Given   piperacillin-tazobactam (ZOSYN) IVPB 3.375 g 3.375 g 12.5 mL/hr   08/21/17 2029 New Bag/Given   vancomycin (VANCOCIN) 1,500 mg in sodium chloride 0.9 % 500 mL IVPB 1,500 mg 250 mL/hr   08/21/17 2242 New Bag/Given   piperacillin-tazobactam (ZOSYN) IVPB 3.375 g 3.375 g 12.5 mL/hr   08/22/17 0443 New Bag/Given   vancomycin (VANCOCIN) 1,500 mg in sodium chloride 0.9 % 500 mL IVPB 1,500 mg 250 mL/hr   08/22/17 0606 New Bag/Given   piperacillin-tazobactam (ZOSYN) IVPB 3.375 g 3.375 g 12.5 mL/hr   08/22/17 1259 New Bag/Given   vancomycin (VANCOCIN) 1,500 mg in sodium chloride 0.9 % 500 mL IVPB 1,500 mg 250 mL/hr   08/22/17 1453 New Bag/Given   piperacillin-tazobactam (ZOSYN) IVPB 3.375 g 3.375 g 12.5 mL/hr   08/22/17 2007 New Bag/Given   vancomycin (VANCOCIN) 1,500 mg in sodium chloride 0.9 % 500 mL IVPB 1,500 mg 250 mL/hr   08/22/17 2118 New Bag/Given   piperacillin-tazobactam (ZOSYN) IVPB 3.375 g  3.375 g 12.5 mL/hr   08/23/17 0459 New Bag/Given   vancomycin (VANCOCIN) 1,500 mg in sodium chloride 0.9 % 500 mL IVPB 1,500 mg 250 mL/hr   08/23/17 0538 New Bag/Given   piperacillin-tazobactam (ZOSYN) IVPB 3.375 g 3.375 g 12.5 mL/hr   08/23/17 1217 New Bag/Given   vancomycin (VANCOCIN) 1,500 mg in sodium chloride 0.9 % 500 mL IVPB 1,500 mg 250 mL/hr   08/23/17 1404 New Bag/Given   piperacillin-tazobactam (ZOSYN) IVPB 3.375 g 3.375 g 12.5 mL/hr      MEDICATIONS: . acidophilus  1 capsule Oral Daily  . amLODipine  5 mg Oral Daily  . aspirin EC  81 mg Oral Daily  . atorvastatin  40 mg Oral q1800  . busPIRone  30 mg Oral TID  . carvedilol  6.25 mg Oral BID WC  . Chlorhexidine Gluconate Cloth  6 each Topical Q0600  . docusate sodium  100 mg Oral BID  . DULoxetine  60 mg Oral Daily  . enoxaparin (LOVENOX) injection  40 mg Subcutaneous Q24H  . Levomilnacipran HCl ER  40 mg Oral Daily  . levothyroxine  75 mcg Oral QAC breakfast  . morphine  15-30 mg Oral Q12H  . multivitamin with minerals  1 tablet Oral Daily  . mupirocin ointment  1 application Nasal BID  . oxyCODONE  10 mg Oral 6 X Daily  . pregabalin  75 mg Oral TID  . tamsulosin  0.4 mg Oral Daily  . [START ON 08/25/2017] testosterone cypionate  140 mg Intramuscular Weekly  . vitamin B-12   Oral Daily  . zolpidem  10 mg Oral QHS    Review of Systems - 11 systems reviewed and negative per HPI   OBJECTIVE: Temp:  [98 F (36.7 C)-99.4 F (37.4 C)] 98.4 F (36.9 C) (05/06 1139) Pulse Rate:  [78-97] 78 (05/06 1139) Resp:  [19-20] 19 (05/06 0007) BP: (109-150)/(71-97) 122/71 (05/06 1139) SpO2:  [91 %-96 %] 95 % (05/06 1139) Weight:  [141.7 kg (312 lb 4.8 oz)] 141.7 kg (312 lb 4.8 oz) (05/06 0540) Physical Exam  Constitutional: He is oriented to person, place, and time. He appears well-developed and well-nourished. No distress.  HENT:  Mouth/Throat: Oropharynx is clear and moist. No oropharyngeal exudate.  Cardiovascular:  Normal rate, regular rhythm and normal heart sounds. Exam reveals no gallop and no friction rub.  No murmur heard.  Pulmonary/Chest: Effort normal and breath sounds normal. No respiratory distress. He has no wheezes.  Abdominal: Soft. Bowel sounds are normal. He exhibits no distension. There is no tenderness.  Lymphadenopathy:  He has no cervical adenopathy.  Neurological: He is alert and oriented to person, place, and time.  Skin: r foot wrapped Psychiatric: He has a normal mood and affect. His behavior is normal.     LABS: Results for orders placed or performed during the hospital encounter of 08/20/17 (from the past 48 hour(s))  Vancomycin, trough     Status: Abnormal   Collection Time: 08/21/17  7:47 PM  Result Value Ref Range   Vancomycin Tr 12 (L) 15 - 20 ug/mL    Comment: Performed at [facility], [address] [place], [place] [address]  CBC     Status: Abnormal   Collection Time: 08/22/17  3:32 AM  Result Value Ref Range   WBC 4.6 3.8 - 10.6 K/uL   RBC 5.16 4.40 - 5.90 MIL/uL   Hemoglobin 14.6 13.0 - 18.0 g/dL   HCT 44.2 40.0 - 52.0 %   MCV 85.7 80.0 - 100.0 fL   MCH 28.4 26.0 - 34.0 pg   MCHC 33.1 32.0 - 36.0 g/dL   RDW 19.3 (H) 11.5 - 14.5 %   Platelets 204 150 - 440 K/uL    Comment: Performed at [facility], [address] [place], [place] [address]  Glucose, capillary     Status: None   Collection Time: 08/22/17  8:30 AM  Result Value Ref Range   Glucose-Capillary 92 65 - 99 mg/dL  Aerobic/Anaerobic Culture (surgical/deep wound)     Status: None (Preliminary result)   Collection Time: 08/22/17  9:30 AM  Result Value Ref Range   Specimen Description WOUND RIGHT FOOT FIRST METATARSAL  Special Requests PATIENT ON FOLLOWING VANC AND ZOSYN    Gram Stain      MODERATE WBC PRESENT,BOTH PMN AND MONONUCLEAR NO ORGANISMS SEEN    Culture      CULTURE REINCUBATED FOR BETTER GROWTH Performed at [facility], [place] [address],  [place], [place] [address]    Report Status PENDING   Aerobic/Anaerobic Culture (surgical/deep wound)     Status: None (Preliminary result)   Collection Time: 08/22/17  9:54 AM  Result Value Ref Range   Specimen Description BONE RIGHT FOOT    Special Requests PATIENT ON FOLLOWING ZOSYN AND VANC    Gram Stain      FEW WBC PRESENT,BOTH PMN AND MONONUCLEAR NO ORGANISMS SEEN    Culture      CULTURE REINCUBATED FOR BETTER GROWTH Performed at [facility], [place] [address] [place], [place] [address]    Report Status PENDING   Glucose, capillary     Status: None   Collection Time: 08/22/17 11:17 AM  Result Value Ref Range   Glucose-Capillary 91 65 - 99 mg/dL  CBC     Status: Abnormal   Collection Time: 08/23/17  3:56 AM  Result Value Ref Range   WBC 4.6 3.8 - 10.6 K/uL   RBC 4.91 4.40 - 5.90 MIL/uL   Hemoglobin 14.3 13.0 - 18.0 g/dL   HCT 42.1 40.0 - 52.0 %   MCV 85.8 80.0 - 100.0 fL   MCH 29.2 26.0 - 34.0 pg   MCHC 34.0 32.0 - 36.0 g/dL   RDW 19.2 (H) 11.5 - 14.5 %   Platelets 215 150 - 440 K/uL    Comment: Performed at [facility], [address], [place], [place] [address]  Basic metabolic panel     Status: Abnormal   Collection Time: 08/23/17  3:56 AM  Result Value Ref Range   Sodium 136 135 - 145 mmol/L   Potassium 3.5 3.5 - 5.1 mmol/L   Chloride 101 101 - 111 mmol/L   CO2 26 22 - 32 mmol/L   Glucose, Bld 163 (H) 65 - 99 mg/dL   BUN 8 6 - 20 mg/dL   Creatinine, Ser 1.02 0.61 - 1.24 mg/dL   Calcium 8.1 (L) 8.9 - 10.3 mg/dL   GFR calc non Af Amer >60 >60 mL/min   GFR calc Af Amer >60 >60 mL/min    Comment: (NOTE) The eGFR has been calculated using the CKD EPI equation. This calculation has not been validated in all clinical situations. eGFR's persistently <60 mL/min signify possible Chronic Kidney Disease.    Anion gap 9 5 - 15    Comment: Performed at [facility], [address] [place], [place] [address]  Vancomycin, trough     Status: None    Collection Time: 08/23/17  3:56 AM  Result Value Ref Range   Vancomycin Tr 16 15 - 20 ug/mL    Comment: Performed at [facility], [address] [place], [place] [address]  Glucose, capillary     Status: Abnormal   Collection Time: 08/23/17  8:17 AM  Result Value Ref Range   Glucose-Capillary 112 (H) 65 - 99 mg/dL   No components found for: ESR, C REACTIVE PROTEIN MICRO: Recent Results (from the past 720 hour(s))  Microscopic Examination     Status: Abnormal   Collection Time: 08/05/17  2:38 PM  Result Value Ref Range Status   WBC, UA None seen 0 - 5 /hpf Final  RBC, UA None seen 0 - 2 /hpf Final   Epithelial Cells (non renal) None seen 0 - 10 /hpf Final   Casts Present (A) None seen /lpf Final   Cast Type Hyaline casts N/A Final   Mucus, UA Present (A) Not Estab. Final   Bacteria, UA Few (A) None seen/Few Final  Blood culture (routine x 2)     Status: None (Preliminary result)   Collection Time: 08/20/17  3:56 PM  Result Value Ref Range Status   Specimen Description BLOOD LEFT ANTECUBITAL  Final   Special Requests   Final    BOTTLES DRAWN AEROBIC AND ANAEROBIC Blood Culture adequate volume   Culture   Final    NO GROWTH 3 DAYS Performed at [facility], [address], [place], [place] [address]    Report Status PENDING  Incomplete  Blood culture (routine x 2)     Status: None (Preliminary result)   Collection Time: 08/20/17  3:56 PM  Result Value Ref Range Status   Specimen Description BLOOD RIGHT ANTECUBITAL  Final   Special Requests   Final    BOTTLES DRAWN AEROBIC AND ANAEROBIC Blood Culture adequate volume   Culture   Final    NO GROWTH 3 DAYS Performed at [facility], [address] [place], [place] [address]    Report Status PENDING  Incomplete  MRSA PCR Screening     Status: Abnormal   Collection Time: 08/21/17 10:51 AM  Result Value Ref Range Status   MRSA by PCR POSITIVE (A) NEGATIVE Final    Comment:        The  GeneXpert MRSA Assay (FDA approved for NASAL specimens only), is one component of a comprehensive MRSA colonization surveillance program. It is not intended to diagnose MRSA infection nor to guide or monitor treatment for MRSA infections. RESULT CALLED TO, READ BACK BY AND VERIFIED WITH: JENNIFER BOLE 08/21/17 @ 1336  [place] Performed at [facility], [address] [place], [place] [address]   Aerobic/Anaerobic Culture (surgical/deep wound)     Status: None (Preliminary result)   Collection Time: 08/22/17  9:30 AM  Result Value Ref Range Status   Specimen Description WOUND RIGHT FOOT FIRST METATARSAL  Final   Special Requests PATIENT ON FOLLOWING VANC AND ZOSYN  Final   Gram Stain   Final    MODERATE WBC PRESENT,BOTH PMN AND MONONUCLEAR NO ORGANISMS SEEN    Culture   Final    CULTURE REINCUBATED FOR BETTER GROWTH Performed at [facility], [place] [address], [place],  [address]    Report Status PENDING  Incomplete  Aerobic/Anaerobic Culture (surgical/deep wound)     Status: None (Preliminary result)   Collection Time: 08/22/17  9:54 AM  Result Value Ref Range Status   Specimen Description BONE RIGHT FOOT  Final   Special Requests PATIENT ON FOLLOWING ZOSYN AND VANC  Final   Gram Stain   Final    FEW WBC PRESENT,BOTH PMN AND MONONUCLEAR NO ORGANISMS SEEN    Culture   Final    CULTURE REINCUBATED FOR BETTER GROWTH Performed at [facility], [place] [address], [place],  [address]    Report Status PENDING  Incomplete   11/18  Enterobacter cloacae    MIC    CEFAZOLIN >=64 RESIST... Resistant    CEFEPIME <=1 SENSITIVE  Sensitive    CEFTAZIDIME <=1 SENSITIVE  Sensitive    CEFTRIAXONE <=1 SENSITIVE  Sensitive    CIPROFLOXACIN <=0.25 SENS.Marland KitchenMarland Kitchen  Sensitive    GENTAMICIN <=1 SENSITIVE  Sensitive    IMIPENEM <=0.25 SENS... Sensitive    PIP/TAZO <=4 SENSITIVE  Sensitive    TRIMETH/SULFA <=20 SENSIT... Sensitive         Susceptibility    Enterococcus  faecalis    MIC    AMPICILLIN <=2 SENSITIVE  Sensitive    GENTAMICIN SYNERGY RESISTANT  Resistant    LEVOFLOXACIN 1 SENSITIVE  Sensitive    VANCOMYCIN 1 SENSITIVE  Sensitive         Susceptibility Comments    IMAGING: Dg Foot Complete Right  Result Date: 08/20/2017 CLINICAL DATA:  Foot swelling and redness. EXAM: RIGHT FOOT COMPLETE - 3+ VIEW COMPARISON:  Right foot x-rays dated February 20, 2017. FINDINGS: Prior first metatarsal and first proximal phalanx osteotomies with prominent lucency about the first metatarsal hardware and osseous resorption along the prior osteotomy site. There is a fracture of the dorsal plate and backing out of several screws. No acute fracture or dislocation. Prior calcaneal osteotomy with single screw fixation. Mild midfoot osteoarthritis. Severe soft tissue swelling along the dorsal and medial forefoot. No subcutaneous emphysema. IMPRESSION: 1. Prior first metatarsal osteotomy with hardware fracture and loosening, probably related to infection given osseous resorption along the osteotomy site, concerning for osteomyelitis, and prominent medial and dorsal forefoot soft tissue swelling. Electronically Signed   By: Titus Dubin M.D.   On: 08/20/2017 14:22   [place] Ekg Site Rite  Result Date: 08/22/2017 If Site Rite image not attached, placement could not be confirmed due to current cardiac rhythm.   Assessment:   Charles [name] is a 57 y.o. male with recurrent osteomyelitis of R great toe and 1st MT after repeat surgery and presence of hardware.  Prior cx with enterobacter and Enterococcus. Treated with 2 weeks IV unasyn, oral cipro then prolonged augmentin and cipro finishing mid Dec 2018. Wound had healed over but still with toe deformity and swelling.  Acute redness and Infection recurred with hardware in place over last few days. CRP up but ESR ony 15. Now s/p removal of hardware. Cx pending Will need 6 weeks IV abx and close followup.  Recommendations Will need  IV abx for 6 weeks.  Cont vanco and zosyn pending cultures.  Will place PICC today Thank you very much for allowing me to participate in the care of this patient. Please call with questions.   Cheral Marker. Ola Spurr, MD

## 2017-08-23 NOTE — Progress Notes (Signed)
Daily Progress Note   Subjective  - 1 Day Post-Op  F/u right foot debridment.  Some pain  Objective Vitals:   08/22/17 1719 08/22/17 1936 08/23/17 0007 08/23/17 0540  BP: 140/80 131/84 140/85   Pulse: 97 93 92   Resp: 20 20 19    Temp: 98 F (36.7 C) 99.4 F (37.4 C) 98.3 F (36.8 C)   TempSrc: Oral Oral Oral   SpO2: 93% 91% 96%   Weight:    (!) 141.7 kg (312 lb 4.8 oz)  Height:        Physical Exam: Incision stable.  No stress to incision.  Erythema improved.  Packing removed and no purulence.  Packing replaced.    Laboratory CBC    Component Value Date/Time   WBC 4.6 08/23/2017 0356   HGB 14.3 08/23/2017 0356   HGB 11.7 (L) 01/27/2013 2020   HCT 42.1 08/23/2017 0356   HCT 48.6 08/05/2017 1459   PLT 215 08/23/2017 0356   PLT 355 01/27/2013 2020    BMET    Component Value Date/Time   NA 136 08/23/2017 0356   NA 140 01/27/2013 2020   K 3.5 08/23/2017 0356   K 3.7 01/27/2013 2020   CL 101 08/23/2017 0356   CL 107 01/27/2013 2020   CO2 26 08/23/2017 0356   CO2 26 01/27/2013 2020   GLUCOSE 163 (H) 08/23/2017 0356   GLUCOSE 85 01/27/2013 2020   BUN 8 08/23/2017 0356   BUN 16 01/27/2013 2020   CREATININE 1.02 08/23/2017 0356   CREATININE 0.98 01/27/2013 2020   CALCIUM 8.1 (L) 08/23/2017 0356   CALCIUM 8.6 01/27/2013 2020   GFRNONAA >60 08/23/2017 0356   GFRNONAA >60 01/27/2013 2020   GFRAA >60 08/23/2017 0356   GFRAA >60 01/27/2013 2020    Assessment/Planning: Abscess/infected hardware/osteomyelitis   Dressing changed.  Bone and swab sent for culture intra-op.  ID consulted.  Will likely need 4-6 weeks IV.  C/W NWB  Will f/u tomorrow.    Samara Deist A  08/23/2017, 6:40 AM \

## 2017-08-23 NOTE — Progress Notes (Signed)
[facility] [place] NAME: Charles [name]    MR#:  [id number]  DATE OF BIRTH:  February 04, 1961  SUBJECTIVE:  CHIEF COMPLAINT: Patient had debridment 5/5/, pain is worse today  REVIEW OF SYSTEMS:  CONSTITUTIONAL: No fever, fatigue or weakness.  EYES: No blurred or double vision.  EARS, NOSE, AND THROAT: No tinnitus or ear pain.  RESPIRATORY: No cough, shortness of breath, wheezing or hemoptysis.  CARDIOVASCULAR: No chest pain, orthopnea, edema.  GASTROINTESTINAL: No nausea, vomiting, diarrhea or abdominal pain.  GENITOURINARY: No dysuria, hematuria.  ENDOCRINE: No polyuria, nocturia,  HEMATOLOGY: No anemia, easy bruising or bleeding SKIN: No rash or lesion. MUSCULOSKELETAL: Right lower leg with dressing NEUROLOGIC: No tingling, numbness, weakness.  PSYCHIATRY: No anxiety or depression.   DRUG ALLERGIES:   Allergies  Allergen Reactions  . Lisinopril Cough    VITALS:  Blood pressure 122/71, pulse 78, temperature 98.4 F (36.9 C), temperature source Oral, resp. rate 19, height 6\' 1"  (1.854 m), weight (!) 141.7 kg (312 lb 4.8 oz), SpO2 95 %.  PHYSICAL EXAMINATION:  GENERAL:  57 y.o.-year-old patient lying in the bed with no acute distress.  EYES: Pupils equal, round, reactive to light and accommodation. No scleral icterus. Extraocular muscles intact.  HEENT: Head atraumatic, normocephalic. Oropharynx and nasopharynx clear.  NECK:  Supple, no jugular venous distention. No thyroid enlargement, no tenderness.  LUNGS: Normal breath sounds bilaterally, no wheezing, rales,rhonchi or crepitation. No use of accessory muscles of respiration.  CARDIOVASCULAR: S1, S2 normal. No murmurs, rubs, or gallops.  ABDOMEN: Soft, nontender, nondistended. Bowel sounds present. No organomegaly or mass.  EXTREMITIES: Right lower extremity and foot with minimal erythema no discharge but very tender, status post multiple surgeries in the past no pedal edema,  cyanosis, or clubbing.  NEUROLOGIC: Cranial nerves II through XII are intact. Muscle strength 5/5 in all extremities except right lower extremity,s/p debridement sensation intact. Gait not checked.  PSYCHIATRIC: The patient is alert and oriented x 3.  SKIN: No obvious rash, lesion, or ulcer.    LABORATORY PANEL:   CBC Recent Labs  Lab 08/23/17 0356  WBC 4.6  HGB 14.3  HCT 42.1  PLT 215   ------------------------------------------------------------------------------------------------------------------  Chemistries  Recent Labs  Lab 08/20/17 1418  08/23/17 0356  NA 133*   < > 136  K 3.6   < > 3.5  CL 100*   < > 101  CO2 27   < > 26  GLUCOSE 160*   < > 163*  BUN 9   < > 8  CREATININE 1.06   < > 1.02  CALCIUM 8.3*   < > 8.1*  AST 19  --   --   ALT 19  --   --   ALKPHOS 78  --   --   BILITOT 1.1  --   --    < > = values in this interval not displayed.   ------------------------------------------------------------------------------------------------------------------  Cardiac Enzymes No results for input(s): TROPONINI in the last 168 hours. ------------------------------------------------------------------------------------------------------------------  RADIOLOGY:  [place] Ekg Site Rite  Result Date: 08/22/2017 If Site Rite image not attached, placement could not be confirmed due to current cardiac rhythm.   EKG:   Orders placed or performed during the hospital encounter of 08/20/17  . EKG 12-Lead  . EKG 12-Lead  . EKG 12-Lead  . EKG 12-Lead  . EKG 12-Lead  . EKG 12-Lead    ASSESSMENT AND PLAN:     57 year old male patient  with multiple medical problems of hypertension, hypothyroidism chronic pain secondary to multiple right foot surgeries,operations comes in with worsening swelling, redness and pain of the right foot and unable to ambulate for past for 5 days.  #Right foot cellulitis with possible underlying osteomyelitis versus a vascular necrosis given the  history of multiple surgeries with hardware in the past Continue IV antibiotics Zosyn and vancomycin Seen by podiatry Dr. Vickki Muff did debridment today: 1.  I&D with excision of distal first metatarsal head 2.  Removal of retained hardware right foot 3.  Extensor hallucis longus tendon lengthening with capsulotomy right first MTPJ  All right foot Wound cultures with no growth so far Continue with nonweightbearing on the right foot PICC line ordered and ID consult placed  Pain management as needed.  Continue home medication MS Contin and oxycodone, Dilaudid dose increased as patient's pain is worse today and tramadol as needed for mild to moderate pain  # essential hypertension: Controlled, continue Coreg, amlodipine.  #.  Depression: Continue Cymbalta, BuSpar.   #chronic pain patient is on MS Contin 15 mg p.o. twice daily, oxycodone 10 mg 6 times daily so restarted them.  #.  History of BPH, testosterone deficiency: Patient is followed by Dr. Bernardo Heater.  Continue Flomax, testosterone injection once a week.  #Constipation: Patient is on daily MiraLAX.,  Add Colace      All the records are reviewed and case discussed with Care Management/Social Workerr. Management plans discussed with the patient, family and they are in agreement.  CODE STATUS: fc  TOTAL TIME TAKING CARE OF THIS PATIENT: 35  minutes.   POSSIBLE D/C IN 2-3DAYS, DEPENDING ON CLINICAL CONDITION.  Note: This dictation was prepared with Dragon dictation along with smaller phrase technology. Any transcriptional errors that result from this process are unintentional.   Nicholes Mango M.D on 08/23/2017 at 3:03 PM  Between 7am to 6pm - Pager - [phone] After 6pm go to [email or web address] - password [email or web address] [facility]  [facility]  Office  [phone]  CC: Primary care physician; Lavera Guise, MD

## 2017-08-23 NOTE — Progress Notes (Signed)
Peripherally Inserted Central Catheter/Midline Placement  The IV Nurse has discussed with the patient and/or persons authorized to consent for the patient, the purpose of this procedure and the potential benefits and risks involved with this procedure.  The benefits include less needle sticks, lab draws from the catheter, and the patient may be discharged home with the catheter. Risks include, but not limited to, infection, bleeding, blood clot (thrombus formation), and puncture of an artery; nerve damage and irregular heartbeat and possibility to perform a PICC exchange if needed/ordered by physician.  Alternatives to this procedure were also discussed.  Bard Power PICC patient education guide, fact sheet on infection prevention and patient information card has been provided to patient /or left at bedside.    PICC/Midline Placement Documentation  PICC Single Lumen 02/23/17 PICC Right Cephalic 44 cm 0 cm (Active)     PICC Single Lumen 68/34/19 PICC Right Basilic 47 cm 0 cm (Active)  Indication for Insertion or Continuance of Line Home intravenous therapies (PICC only) 08/23/2017  5:00 PM  Exposed Catheter (cm) 0 cm 08/23/2017  5:00 PM  Site Assessment Clean;Dry;Intact 08/23/2017  5:00 PM  Line Status Flushed;Saline locked;Blood return noted 08/23/2017  5:00 PM  Dressing Type Transparent;Securing device 08/23/2017  5:00 PM  Dressing Status Clean;Dry;Intact;Antimicrobial disc in place 08/23/2017  5:00 PM  Dressing Change Due 08/30/17 08/23/2017  5:00 PM       Frances Maywood 08/23/2017, 5:23 PM

## 2017-08-23 NOTE — Care Management (Signed)
POD # 1 right foot debridement. Cx sent ID consulted. Case discussed in progression rounds. May need 4-6 weeks home IV abx. Corene Cornea with [facility] made aware.

## 2017-08-23 NOTE — Progress Notes (Signed)
Pharmacy Antibiotic Note  Charles [name] is a 57 y.o. male admitted on 08/20/2017 with osteomyelitis.  Pharmacy has been consulted for vancomycin dosing.  Plan: 5/4 1947 VT: 12. Level is subtherapeutic. Will increase dose to Vancomycin 1500 IV every 8 hours. Calculated trough at Css is 15. Trough level prior to 4th dose.   5/6 AM vanc level 16. Continue current regimen.  New: Ke: 0.097     T1/2: 7.15  Vd: 93  Continue Zosyn dose is 3.375g EI q8h  Height: 6\' 1"  (185.4 cm) Weight: 292 lb (132.5 kg) IBW/kg (Calculated) : 79.9  Temp (24hrs), Avg:98.8 F (37.1 C), Min:98 F (36.7 C), Max:100 F (37.8 C)  Recent Labs  Lab 08/20/17 1418 08/20/17 1556 08/21/17 0402 08/21/17 1947 08/22/17 0332 08/23/17 0356  WBC 10.1 10.7* 7.1  --  4.6 4.6  CREATININE 1.06 1.05 0.96  --   --  1.02  LATICACIDVEN 1.4  --   --   --   --   --   VANCOTROUGH  --   --   --  12*  --  16    Estimated Creatinine Clearance: 114 mL/min (by C-G formula based on SCr of 1.02 mg/dL).    Allergies  Allergen Reactions  . Lisinopril Cough    Antimicrobials this admission: Vancomycin 5/3 >>  Zosyn 5/3 >>   Microbiology results: Wound 5/3  Thank you for allowing pharmacy to be a part of this patient's care.  Eloise Harman, PharmD, BCPS Clinical Pharmacist 08/23/2017 5:01 AM

## 2017-08-24 LAB — CBC
HCT: 43.6 % (ref 40.0–52.0)
Hemoglobin: 14.2 g/dL (ref 13.0–18.0)
MCH: 28.1 pg (ref 26.0–34.0)
MCHC: 32.5 g/dL (ref 32.0–36.0)
MCV: 86.3 fL (ref 80.0–100.0)
Platelets: 225 10*3/uL (ref 150–440)
RBC: 5.05 MIL/uL (ref 4.40–5.90)
RDW: 19.5 % — ABNORMAL HIGH (ref 11.5–14.5)
WBC: 4.1 10*3/uL (ref 3.8–10.6)

## 2017-08-24 LAB — GLUCOSE, CAPILLARY: Glucose-Capillary: 100 mg/dL — ABNORMAL HIGH (ref 65–99)

## 2017-08-24 MED ORDER — HYDROMORPHONE HCL 2 MG PO TABS
2.0000 mg | ORAL_TABLET | ORAL | Status: DC | PRN
  Administered 2017-08-24 (×2): 2 mg via ORAL
  Filled 2017-08-24 (×2): qty 1

## 2017-08-24 NOTE — Progress Notes (Signed)
[facility] [place] NAME: Charles [name]    MR#:  [id number]  DATE OF BIRTH:  [birth year]  SUBJECTIVE:  CHIEF COMPLAINT: Patient had debridment 5/5/, pain is okay and agreeable to change IV to p.o. Dilaudid today.  Patient sees pain management and next appointment is this Friday  REVIEW OF SYSTEMS:  CONSTITUTIONAL: No fever, fatigue or weakness.  EYES: No blurred or double vision.  EARS, NOSE, AND THROAT: No tinnitus or ear pain.  RESPIRATORY: No cough, shortness of breath, wheezing or hemoptysis.  CARDIOVASCULAR: No chest pain, orthopnea, edema.  GASTROINTESTINAL: No nausea, vomiting, diarrhea or abdominal pain.  GENITOURINARY: No dysuria, hematuria.  ENDOCRINE: No polyuria, nocturia,  HEMATOLOGY: No anemia, easy bruising or bleeding SKIN: No rash or lesion. MUSCULOSKELETAL: Right lower leg with dressing NEUROLOGIC: No tingling, numbness, weakness.  PSYCHIATRY: No anxiety or depression.   DRUG ALLERGIES:   Allergies  Allergen Reactions  . Lisinopril Cough    VITALS:  Blood pressure (!) 136/91, pulse 77, temperature 98.2 F (36.8 C), temperature source Oral, resp. rate 18, height 6\' 1"  (1.854 m), weight (!) 137.9 kg (304 lb 0.2 oz), SpO2 100 %.  PHYSICAL EXAMINATION:  GENERAL:  57 y.o.-year-old patient lying in the bed with no acute distress.  EYES: Pupils equal, round, reactive to light and accommodation. No scleral icterus. Extraocular muscles intact.  HEENT: Head atraumatic, normocephalic. Oropharynx and nasopharynx clear.  NECK:  Supple, no jugular venous distention. No thyroid enlargement, no tenderness.  LUNGS: Normal breath sounds bilaterally, no wheezing, rales,rhonchi or crepitation. No use of accessory muscles of respiration.  CARDIOVASCULAR: S1, S2 normal. No murmurs, rubs, or gallops.  ABDOMEN: Soft, nontender, nondistended. Bowel sounds present. No organomegaly or mass.  EXTREMITIES: Right lower extremity and  foot with minimal erythema no discharge but very tender, status post multiple surgeries in the past no pedal edema, cyanosis, or clubbing.  NEUROLOGIC: Cranial nerves II through XII are intact. Muscle strength 5/5 in all extremities except right lower extremity,s/p debridement sensation intact. Gait not checked.  PSYCHIATRIC: The patient is alert and oriented x 3.  SKIN: No obvious rash, lesion, or ulcer.    LABORATORY PANEL:   CBC Recent Labs  Lab 08/24/17 0458  WBC 4.1  HGB 14.2  HCT 43.6  PLT 225   ------------------------------------------------------------------------------------------------------------------  Chemistries  Recent Labs  Lab 08/20/17 1418  08/23/17 0356  NA 133*   < > 136  K 3.6   < > 3.5  CL 100*   < > 101  CO2 27   < > 26  GLUCOSE 160*   < > 163*  BUN 9   < > 8  CREATININE 1.06   < > 1.02  CALCIUM 8.3*   < > 8.1*  AST 19  --   --   ALT 19  --   --   ALKPHOS 78  --   --   BILITOT 1.1  --   --    < > = values in this interval not displayed.   ------------------------------------------------------------------------------------------------------------------  Cardiac Enzymes No results for input(s): TROPONINI in the last 168 hours. ------------------------------------------------------------------------------------------------------------------  RADIOLOGY:  No results found.  EKG:   Orders placed or performed during the hospital encounter of 08/20/17  . EKG 12-Lead  . EKG 12-Lead  . EKG 12-Lead  . EKG 12-Lead  . EKG 12-Lead  . EKG 12-Lead    ASSESSMENT AND PLAN:     57 year old male patient with multiple  medical problems of hypertension, hypothyroidism chronic pain secondary to multiple right foot surgeries,operations comes in with worsening swelling, redness and pain of the right foot and unable to ambulate for past for 5 days.  #Right foot cellulitis with possible underlying osteomyelitis versus a vascular necrosis given the history  of multiple surgeries with hardware in the past Continue IV antibiotics Zosyn and vancomycin Seen by podiatry Dr. Vickki Muff did debridment : 1.  I&D with excision of distal first metatarsal head 2.  Removal of retained hardware right foot 3.  Extensor hallucis longus tendon lengthening with capsulotomy right first MTPJ  All right foot Wound cultures with rare enterococcus sensitive to vancomycin Seen by infectious disease who is recommending IV antibiotics for 6 weeks For now plan is to continue vancomycin and Zosyn pending cultures, anticipating to discharge patient tomorrow with home health and IV antibiotics Continue with nonweightbearing on the right foot PICC line placed   podiatrist recommending dressing changes 3 times a week and outpatient follow-up  #Chronic pain  Syndrome  on MS Contin 15 mg p.o. twice daily, oxycodone 10 mg 6 times daily Outpatient follow-up with pain management clinic on Friday as scheduled Pain management as needed.  Continue home medication MS Contin and oxycodone  # essential hypertension: Controlled, continue Coreg, amlodipine.  #.  Depression: Continue Cymbalta, BuSpar.  #.  History of BPH, testosterone deficiency: Patient is followed by Dr. Bernardo Heater.  Continue Flomax, testosterone injection once a week.  #Constipation: Patient is on daily MiraLAX.,  Add Colace   Disposition Home with home health.  Case management consulted regarding IV antibiotics and dressing changes at home   All the records are reviewed and case discussed with Care Management/Social Workerr. Management plans discussed with the patient, he is in agreement CODE STATUS: fc  TOTAL TIME TAKING CARE OF THIS PATIENT: 35  minutes.   POSSIBLE D/C IN 1DAYS, DEPENDING ON CLINICAL CONDITION.  Note: This dictation was prepared with Dragon dictation along with smaller phrase technology. Any transcriptional errors that result from this process are unintentional.   Nicholes Mango M.D on 08/24/2017  at 1:19 PM  Between 7am to 6pm - Pager - [phone] After 6pm go to [email or web address] - password [email or web address] [facility]  [facility]  Office  [phone]  CC: Primary care physician; Lavera Guise, MD

## 2017-08-24 NOTE — Progress Notes (Signed)
[facility] INFECTIOUS DISEASE PROGRESS NOTE Date of Admission:  08/20/2017     ID: Charles [name] is a 57 y.o. male with recurrent OM R great toe and 1st MT Active Problems:   Osteomyelitis (HCC)   Subjective: No fevers, pain improving.   ROS  Eleven systems are reviewed and negative except per hpi  Medications:  Antibiotics Given (last 72 hours)    Date/Time Action Medication Dose Rate   08/21/17 2029 New Bag/Given   vancomycin (VANCOCIN) 1,500 mg in sodium chloride 0.9 % 500 mL IVPB 1,500 mg 250 mL/hr   08/21/17 2242 New Bag/Given   piperacillin-tazobactam (ZOSYN) IVPB 3.375 g 3.375 g 12.5 mL/hr   08/22/17 0443 New Bag/Given   vancomycin (VANCOCIN) 1,500 mg in sodium chloride 0.9 % 500 mL IVPB 1,500 mg 250 mL/hr   08/22/17 0606 New Bag/Given   piperacillin-tazobactam (ZOSYN) IVPB 3.375 g 3.375 g 12.5 mL/hr   08/22/17 1259 New Bag/Given   vancomycin (VANCOCIN) 1,500 mg in sodium chloride 0.9 % 500 mL IVPB 1,500 mg 250 mL/hr   08/22/17 1453 New Bag/Given   piperacillin-tazobactam (ZOSYN) IVPB 3.375 g 3.375 g 12.5 mL/hr   08/22/17 2007 New Bag/Given   vancomycin (VANCOCIN) 1,500 mg in sodium chloride 0.9 % 500 mL IVPB 1,500 mg 250 mL/hr   08/22/17 2118 New Bag/Given   piperacillin-tazobactam (ZOSYN) IVPB 3.375 g 3.375 g 12.5 mL/hr   08/23/17 0459 New Bag/Given   vancomycin (VANCOCIN) 1,500 mg in sodium chloride 0.9 % 500 mL IVPB 1,500 mg 250 mL/hr   08/23/17 0538 New Bag/Given   piperacillin-tazobactam (ZOSYN) IVPB 3.375 g 3.375 g 12.5 mL/hr   08/23/17 1217 New Bag/Given   vancomycin (VANCOCIN) 1,500 mg in sodium chloride 0.9 % 500 mL IVPB 1,500 mg 250 mL/hr   08/23/17 1404 New Bag/Given   piperacillin-tazobactam (ZOSYN) IVPB 3.375 g 3.375 g 12.5 mL/hr   08/23/17 2030 New Bag/Given   vancomycin (VANCOCIN) 1,500 mg in sodium chloride 0.9 % 500 mL IVPB 1,500 mg 250 mL/hr   08/23/17 2152 New Bag/Given   piperacillin-tazobactam (ZOSYN) IVPB 3.375 g 3.375 g 12.5 mL/hr   08/24/17 0448 New Bag/Given   vancomycin (VANCOCIN) 1,500 mg in sodium chloride 0.9 % 500 mL IVPB 1,500 mg 250 mL/hr   08/24/17 7425 New Bag/Given   piperacillin-tazobactam (ZOSYN) IVPB 3.375 g 3.375 g 12.5 mL/hr   08/24/17 1204 New Bag/Given   vancomycin (VANCOCIN) 1,500 mg in sodium chloride 0.9 % 500 mL IVPB 1,500 mg 250 mL/hr   08/24/17 1418 New Bag/Given   piperacillin-tazobactam (ZOSYN) IVPB 3.375 g 3.375 g 12.5 mL/hr     . acidophilus  1 capsule Oral Daily  . amLODipine  5 mg Oral Daily  . aspirin EC  81 mg Oral Daily  . atorvastatin  40 mg Oral q1800  . busPIRone  30 mg Oral TID  . carvedilol  6.25 mg Oral BID WC  . Chlorhexidine Gluconate Cloth  6 each Topical Q0600  . docusate sodium  100 mg Oral BID  . DULoxetine  60 mg Oral Daily  . enoxaparin (LOVENOX) injection  40 mg Subcutaneous Q24H  . Levomilnacipran HCl ER  40 mg Oral Daily  . levothyroxine  75 mcg Oral QAC breakfast  . morphine  15-30 mg Oral Q12H  . multivitamin with minerals  1 tablet Oral Daily  . mupirocin ointment  1 application Nasal BID  . oxyCODONE  10 mg Oral 6 X Daily  . pregabalin  75 mg Oral TID  .  sodium chloride flush  10-40 mL Intracatheter Q12H  . tamsulosin  0.4 mg Oral Daily  . [START ON 08/25/2017] testosterone cypionate  140 mg Intramuscular Weekly  . vitamin B-12   Oral Daily  . zolpidem  10 mg Oral QHS    Objective: Vital signs in last 24 hours: Temp:  [97.7 F (36.5 C)-98.2 F (36.8 C)] 97.7 F (36.5 C) (05/07 1544) Pulse Rate:  [71-80] 71 (05/07 1544) Resp:  [18-20] 18 (05/07 0753) BP: (114-137)/(54-91) 114/54 (05/07 1544) SpO2:  [95 %-100 %] 96 % (05/07 1544) Weight:  [137.9 kg (304 lb 0.2 oz)] 137.9 kg (304 lb 0.2 oz) (05/07 0720) Constitutional: He is oriented to person, place, and time. He appears well-developed and well-nourished. No distress.  HENT:  Mouth/Throat: Oropharynx is clear and moist. No oropharyngeal exudate.  Cardiovascular: Normal rate, regular rhythm and  normal heart sounds. Exam reveals no gallop and no friction rub.  No murmur heard.  Pulmonary/Chest: Effort normal and breath sounds normal. No respiratory distress. He has no wheezes.  Abdominal: Soft. Bowel sounds are normal. He exhibits no distension. There is no tenderness.  Lymphadenopathy:  He has no cervical adenopathy.  Neurological: He is alert and oriented to person, place, and time.  Skin: r foot wrapped Psychiatric: He has a normal mood and affect. His behavior is normal.     Lab Results Recent Labs    08/23/17 0356 08/24/17 0458  WBC 4.6 4.1  HGB 14.3 14.2  HCT 42.1 43.6  NA 136  --   K 3.5  --   CL 101  --   CO2 26  --   BUN 8  --   CREATININE 1.02  --     Microbiology: Results for orders placed or performed during the hospital encounter of 08/20/17  Blood culture (routine x 2)     Status: None (Preliminary result)   Collection Time: 08/20/17  3:56 PM  Result Value Ref Range Status   Specimen Description BLOOD LEFT ANTECUBITAL  Final   Special Requests   Final    BOTTLES DRAWN AEROBIC AND ANAEROBIC Blood Culture adequate volume   Culture   Final    NO GROWTH 4 DAYS Performed at [facility], [address] [place], [place] [address]    Report Status PENDING  Incomplete  Blood culture (routine x 2)     Status: None (Preliminary result)   Collection Time: 08/20/17  3:56 PM  Result Value Ref Range Status   Specimen Description BLOOD RIGHT ANTECUBITAL  Final   Special Requests   Final    BOTTLES DRAWN AEROBIC AND ANAEROBIC Blood Culture adequate volume   Culture   Final    NO GROWTH 4 DAYS Performed at [facility], [address], [place], [place] [address]    Report Status PENDING  Incomplete  MRSA PCR Screening     Status: Abnormal   Collection Time: 08/21/17 10:51 AM  Result Value Ref Range Status   MRSA by PCR POSITIVE (A) NEGATIVE Final    Comment:        The GeneXpert MRSA Assay (FDA approved for NASAL specimens only),  is one component of a comprehensive MRSA colonization surveillance program. It is not intended to diagnose MRSA infection nor to guide or monitor treatment for MRSA infections. RESULT CALLED TO, READ BACK BY AND VERIFIED WITH: JENNIFER BOLE 08/21/17 @ 1336  [place] Performed at [facility], [address], [place], [place] [address]   Aerobic/Anaerobic Culture (  surgical/deep wound)     Status: None (Preliminary result)   Collection Time: 08/22/17  9:30 AM  Result Value Ref Range Status   Specimen Description WOUND RIGHT FOOT FIRST METATARSAL  Final   Special Requests PATIENT ON FOLLOWING VANC AND ZOSYN  Final   Gram Stain   Final    MODERATE WBC PRESENT,BOTH PMN AND MONONUCLEAR NO ORGANISMS SEEN    Culture RARE ENTEROCOCCUS FAECALIS  Final   Report Status PENDING  Incomplete   Organism ID, Bacteria ENTEROCOCCUS FAECALIS  Final      Susceptibility   Enterococcus faecalis - MIC*    AMPICILLIN <=2 SENSITIVE Sensitive     VANCOMYCIN 1 SENSITIVE Sensitive     GENTAMICIN SYNERGY Value in next row Resistant      RESISTANTPerformed at [place] [facility] [place] [address], [place], [place] [address]    * RARE ENTEROCOCCUS FAECALIS  Aerobic/Anaerobic Culture (surgical/deep wound)     Status: None (Preliminary result)   Collection Time: 08/22/17  9:54 AM  Result Value Ref Range Status   Specimen Description BONE RIGHT FOOT  Final   Special Requests PATIENT ON FOLLOWING ZOSYN AND VANC  Final   Gram Stain   Final    FEW WBC PRESENT,BOTH PMN AND MONONUCLEAR NO ORGANISMS SEEN Performed at [facility], [place] [address] [place], [place] [address]    Culture RARE ENTEROCOCCUS FAECALIS  Final   Report Status PENDING  Incomplete    Studies/Results: No results found.  Assessment/Plan: ROB MCIVER is a 57 y.o. male with recurrent osteomyelitis of R great toe and 1st MT after repeat surgery and presence of hardware.  Prior cx with enterobacter and Enterococcus. Treated with 2  weeks IV unasyn, oral cipro then prolonged augmentin and cipro finishing mid Dec 2018. Wound had healed over but still with toe deformity and swelling.  Acute redness and Infection recurred with hardware in place over last few days. CRP up but ESR ony 15. Now s/p removal of hardware. Cx with amp sensitive enterococcus. Will need 6 weeks IV abx and close followup.  Recommendations Cont vanco and zosyn.  If no MRSA can dc vanco.   Thank you very much for the consult. Will follow with you.  Leonel Ramsay   08/24/2017, 4:04 PM

## 2017-08-24 NOTE — Progress Notes (Signed)
Daily Progress Note   Subjective  - 2 Days Post-Op  I & D right foot.  Some pain but tolerable  Objective Vitals:   08/24/17 0720 08/24/17 0753 08/24/17 1544 08/24/17 1645  BP:  (!) 136/91 (!) 114/54 (!) 145/90  Pulse:  77 71 73  Resp:  18  18  Temp:  98.2 F (36.8 C) 97.7 F (36.5 C)   TempSrc:  Oral Oral   SpO2:  100% 96%   Weight: (!) 137.9 kg (304 lb 0.2 oz)     Height:        Physical Exam: Incision continues to be stable.   Minimal erythema.  No purulence.  Edema improved.  Laboratory CBC    Component Value Date/Time   WBC 4.1 08/24/2017 0458   HGB 14.2 08/24/2017 0458   HGB 11.7 (L) 01/27/2013 2020   HCT 43.6 08/24/2017 0458   HCT 48.6 08/05/2017 1459   PLT 225 08/24/2017 0458   PLT 355 01/27/2013 2020    BMET    Component Value Date/Time   NA 136 08/23/2017 0356   NA 140 01/27/2013 2020   K 3.5 08/23/2017 0356   K 3.7 01/27/2013 2020   CL 101 08/23/2017 0356   CL 107 01/27/2013 2020   CO2 26 08/23/2017 0356   CO2 26 01/27/2013 2020   GLUCOSE 163 (H) 08/23/2017 0356   GLUCOSE 85 01/27/2013 2020   BUN 8 08/23/2017 0356   BUN 16 01/27/2013 2020   CREATININE 1.02 08/23/2017 0356   CREATININE 0.98 01/27/2013 2020   CALCIUM 8.1 (L) 08/23/2017 0356   CALCIUM 8.6 01/27/2013 2020   GFRNONAA >60 08/23/2017 0356   GFRNONAA >60 01/27/2013 2020   GFRAA >60 08/23/2017 0356   GFRAA >60 01/27/2013 2020    Assessment/Planning: Osteomyelitis with hardware infection s/p debridment   Cultures showing enterococcus.  IV abx per ID.  Wound is stable and recommend NWB to foot till wound healed.  Dressing changes:  Flush wound with saline and apply bulky dressing 3 times per week or as needed.  F/U with me in 2 weeks.  OK from podiatry standpoint to d/c when ready.  Samara Deist A  08/24/2017, 6:18 PM

## 2017-08-25 LAB — CULTURE, BLOOD (ROUTINE X 2)
CULTURE: NO GROWTH
Culture: NO GROWTH
SPECIAL REQUESTS: ADEQUATE
Special Requests: ADEQUATE

## 2017-08-25 LAB — GLUCOSE, CAPILLARY: GLUCOSE-CAPILLARY: 105 mg/dL — AB (ref 65–99)

## 2017-08-25 MED ORDER — PIPERACILLIN-TAZOBACTAM 3.375 G IVPB: 3.3750 g | Freq: Three times a day (TID) | INTRAVENOUS | Status: DC

## 2017-08-25 MED ORDER — MORPHINE SULFATE ER 30 MG PO TBCR: 30.0000 mg | EXTENDED_RELEASE_TABLET | Freq: Every morning | ORAL | Status: DC

## 2017-08-25 MED ORDER — DOCUSATE SODIUM 100 MG PO CAPS: 100.0000 mg | ORAL_CAPSULE | Freq: Two times a day (BID) | ORAL | 0 refills | Status: DC

## 2017-08-25 MED ORDER — OXYCODONE HCL 5 MG PO TABS: 5.0000 mg | ORAL_TABLET | ORAL | Status: DC | PRN

## 2017-08-25 MED ORDER — RISAQUAD PO CAPS: 1.0000 | ORAL_CAPSULE | Freq: Every day | ORAL | 0 refills | Status: DC

## 2017-08-25 MED ORDER — MUPIROCIN 2 % EX OINT: 1.0000 "application " | TOPICAL_OINTMENT | Freq: Two times a day (BID) | CUTANEOUS | 0 refills | Status: DC

## 2017-08-25 MED ORDER — VANCOMYCIN HCL 10 G IV SOLR: 1500.0000 mg | Freq: Three times a day (TID) | INTRAVENOUS | 0 refills | Status: DC

## 2017-08-25 MED ORDER — ENOXAPARIN SODIUM 40 MG/0.4ML ~~LOC~~ SOLN
40.0000 mg | Freq: Two times a day (BID) | SUBCUTANEOUS | Status: DC
  Administered 2017-08-25: 40 mg via SUBCUTANEOUS
  Filled 2017-08-25: qty 0.4

## 2017-08-25 MED ORDER — ACETAMINOPHEN 325 MG PO TABS: 650.0000 mg | ORAL_TABLET | Freq: Four times a day (QID) | ORAL | Status: DC | PRN

## 2017-08-25 MED ORDER — OXYCODONE HCL 5 MG PO TABS: 5.0000 mg | ORAL_TABLET | ORAL | 0 refills | Status: DC | PRN

## 2017-08-25 MED ORDER — MORPHINE SULFATE ER 15 MG PO TBCR: 15.0000 mg | EXTENDED_RELEASE_TABLET | Freq: Every day | ORAL | Status: DC

## 2017-08-25 MED ORDER — AMPICILLIN-SULBACTAM SODIUM 3 (2-1) G IJ SOLR: 12.0000 g | INTRAMUSCULAR | 0 refills | Status: AC

## 2017-08-25 NOTE — Progress Notes (Signed)
Pharmacy Lovenox Dosing  57 y.o. male admitted with Foot Pain . Patient ordered Lovenox 40 mg daily for VTE prophylaxis.   Filed Weights   08/23/17 0540 08/24/17 0720 08/25/17 0505  Weight: (!) 312 lb 4.8 oz (141.7 kg) (!) 304 lb 0.2 oz (137.9 kg) (!) 311 lb 8.2 oz (141.3 kg)    Body mass index is 41.1 kg/m.  Estimated Creatinine Clearance: 118.1 mL/min (by C-G formula based on SCr of 1.02 mg/dL).  Will adjust Lovenox dosing to 40 mg BID for BMI > 40.   Ulice Dash D 08/25/2017 11:43 AM

## 2017-08-25 NOTE — Progress Notes (Signed)
[facility] INFECTIOUS DISEASE PROGRESS NOTE Date of Admission:  08/20/2017     ID: Charles [name] is a 57 y.o. male with recurrent OM R great toe and 1st MT Active Problems:   Osteomyelitis (HCC)   Subjective: No fevers, pain improving.   ROS  Eleven systems are reviewed and negative except per hpi  Medications:  Antibiotics Given (last 72 hours)    Date/Time Action Medication Dose Rate   08/22/17 1453 New Bag/Given   piperacillin-tazobactam (ZOSYN) IVPB 3.375 g 3.375 g 12.5 mL/hr   08/22/17 2007 New Bag/Given   vancomycin (VANCOCIN) 1,500 mg in sodium chloride 0.9 % 500 mL IVPB 1,500 mg 250 mL/hr   08/22/17 2118 New Bag/Given   piperacillin-tazobactam (ZOSYN) IVPB 3.375 g 3.375 g 12.5 mL/hr   08/23/17 0459 New Bag/Given   vancomycin (VANCOCIN) 1,500 mg in sodium chloride 0.9 % 500 mL IVPB 1,500 mg 250 mL/hr   08/23/17 0538 New Bag/Given   piperacillin-tazobactam (ZOSYN) IVPB 3.375 g 3.375 g 12.5 mL/hr   08/23/17 1217 New Bag/Given   vancomycin (VANCOCIN) 1,500 mg in sodium chloride 0.9 % 500 mL IVPB 1,500 mg 250 mL/hr   08/23/17 1404 New Bag/Given   piperacillin-tazobactam (ZOSYN) IVPB 3.375 g 3.375 g 12.5 mL/hr   08/23/17 2030 New Bag/Given   vancomycin (VANCOCIN) 1,500 mg in sodium chloride 0.9 % 500 mL IVPB 1,500 mg 250 mL/hr   08/23/17 2152 New Bag/Given   piperacillin-tazobactam (ZOSYN) IVPB 3.375 g 3.375 g 12.5 mL/hr   08/24/17 0448 New Bag/Given   vancomycin (VANCOCIN) 1,500 mg in sodium chloride 0.9 % 500 mL IVPB 1,500 mg 250 mL/hr   08/24/17 3016 New Bag/Given   piperacillin-tazobactam (ZOSYN) IVPB 3.375 g 3.375 g 12.5 mL/hr   08/24/17 1204 New Bag/Given   vancomycin (VANCOCIN) 1,500 mg in sodium chloride 0.9 % 500 mL IVPB 1,500 mg 250 mL/hr   08/24/17 1418 New Bag/Given   piperacillin-tazobactam (ZOSYN) IVPB 3.375 g 3.375 g 12.5 mL/hr   08/24/17 2120 New Bag/Given   piperacillin-tazobactam (ZOSYN) IVPB 3.375 g 3.375 g 12.5 mL/hr   08/24/17 2127 New  Bag/Given   vancomycin (VANCOCIN) 1,500 mg in sodium chloride 0.9 % 500 mL IVPB 1,500 mg 250 mL/hr   08/25/17 0536 New Bag/Given   vancomycin (VANCOCIN) 1,500 mg in sodium chloride 0.9 % 500 mL IVPB 1,500 mg 250 mL/hr   08/25/17 0109 New Bag/Given   piperacillin-tazobactam (ZOSYN) IVPB 3.375 g 3.375 g 12.5 mL/hr   08/25/17 1227 New Bag/Given   vancomycin (VANCOCIN) 1,500 mg in sodium chloride 0.9 % 500 mL IVPB 1,500 mg 250 mL/hr   08/25/17 1348 New Bag/Given   piperacillin-tazobactam (ZOSYN) IVPB 3.375 g 3.375 g 12.5 mL/hr     . acidophilus  1 capsule Oral Daily  . amLODipine  5 mg Oral Daily  . aspirin EC  81 mg Oral Daily  . atorvastatin  40 mg Oral q1800  . busPIRone  30 mg Oral TID  . carvedilol  6.25 mg Oral BID WC  . Chlorhexidine Gluconate Cloth  6 each Topical Q0600  . docusate sodium  100 mg Oral BID  . DULoxetine  60 mg Oral Daily  . enoxaparin (LOVENOX) injection  40 mg Subcutaneous BID  . Levomilnacipran HCl ER  40 mg Oral Daily  . levothyroxine  75 mcg Oral QAC breakfast  . [START ON 08/26/2017] morphine  30 mg Oral q morning - 10a   And  . morphine  15 mg Oral QHS  . multivitamin with  minerals  1 tablet Oral Daily  . mupirocin ointment  1 application Nasal BID  . oxyCODONE  10 mg Oral 6 X Daily  . pregabalin  75 mg Oral TID  . sodium chloride flush  10-40 mL Intracatheter Q12H  . tamsulosin  0.4 mg Oral Daily  . testosterone cypionate  140 mg Intramuscular Weekly  . vitamin B-12   Oral Daily  . zolpidem  10 mg Oral QHS    Objective: Vital signs in last 24 hours: Temp:  [97.7 F (36.5 C)-97.8 F (36.6 C)] 97.8 F (36.6 C) (05/08 0810) Pulse Rate:  [70-73] 72 (05/08 0810) Resp:  [18] 18 (05/08 0810) BP: (114-150)/(54-90) 150/90 (05/08 0810) SpO2:  [95 %-96 %] 95 % (05/08 0810) Weight:  [141.3 kg (311 lb 8.2 oz)] 141.3 kg (311 lb 8.2 oz) (05/08 0505) Constitutional: He is oriented to person, place, and time. He appears well-developed and well-nourished. No  distress.  HENT:  Mouth/Throat: Oropharynx is clear and moist. No oropharyngeal exudate.  Cardiovascular: Normal rate, regular rhythm and normal heart sounds. Exam reveals no gallop and no friction rub.  No murmur heard.  Pulmonary/Chest: Effort normal and breath sounds normal. No respiratory distress. He has no wheezes.  Abdominal: Soft. Bowel sounds are normal. He exhibits no distension. There is no tenderness.  Lymphadenopathy:  He has no cervical adenopathy.  Neurological: He is alert and oriented to person, place, and time.  Skin: r foot wrapped Psychiatric: He has a normal mood and affect. His behavior is normal.     Lab Results Recent Labs    08/23/17 0356 08/24/17 0458  WBC 4.6 4.1  HGB 14.3 14.2  HCT 42.1 43.6  NA 136  --   K 3.5  --   CL 101  --   CO2 26  --   BUN 8  --   CREATININE 1.02  --     Microbiology: Results for orders placed or performed during the hospital encounter of 08/20/17  Blood culture (routine x 2)     Status: None   Collection Time: 08/20/17  3:56 PM  Result Value Ref Range Status   Specimen Description BLOOD LEFT ANTECUBITAL  Final   Special Requests   Final    BOTTLES DRAWN AEROBIC AND ANAEROBIC Blood Culture adequate volume   Culture   Final    NO GROWTH 5 DAYS Performed at [facility], [address] [place], [place] [address]    Report Status 08/25/2017 FINAL  Final  Blood culture (routine x 2)     Status: None   Collection Time: 08/20/17  3:56 PM  Result Value Ref Range Status   Specimen Description BLOOD RIGHT ANTECUBITAL  Final   Special Requests   Final    BOTTLES DRAWN AEROBIC AND ANAEROBIC Blood Culture adequate volume   Culture   Final    NO GROWTH 5 DAYS Performed at [facility], [address] [place], [place] [address]    Report Status 08/25/2017 FINAL  Final  MRSA PCR Screening     Status: Abnormal   Collection Time: 08/21/17 10:51 AM  Result Value Ref Range Status   MRSA by PCR POSITIVE  (A) NEGATIVE Final    Comment:        The GeneXpert MRSA Assay (FDA approved for NASAL specimens only), is one component of a comprehensive MRSA colonization surveillance program. It is not intended to diagnose MRSA infection nor to guide or monitor treatment for MRSA infections. RESULT CALLED  TO, READ BACK BY AND VERIFIED WITH: JENNIFER BOLE 08/21/17 @ 1336  [place] Performed at [facility], [address] [place], [place] [address]   Aerobic/Anaerobic Culture (surgical/deep wound)     Status: None (Preliminary result)   Collection Time: 08/22/17  9:30 AM  Result Value Ref Range Status   Specimen Description WOUND RIGHT FOOT FIRST METATARSAL  Final   Special Requests PATIENT ON FOLLOWING VANC AND ZOSYN  Final   Gram Stain   Final    MODERATE WBC PRESENT,BOTH PMN AND MONONUCLEAR NO ORGANISMS SEEN Performed at [facility], [place] [address], [place], [place] [address]    Culture   Final    RARE ENTEROCOCCUS FAECALIS NO ANAEROBES ISOLATED; CULTURE IN PROGRESS FOR 5 DAYS    Report Status PENDING  Incomplete   Organism ID, Bacteria ENTEROCOCCUS FAECALIS  Final      Susceptibility   Enterococcus faecalis - MIC*    AMPICILLIN <=2 SENSITIVE Sensitive     VANCOMYCIN 1 SENSITIVE Sensitive     GENTAMICIN SYNERGY RESISTANT Resistant     * RARE ENTEROCOCCUS FAECALIS  Aerobic/Anaerobic Culture (surgical/deep wound)     Status: None (Preliminary result)   Collection Time: 08/22/17  9:54 AM  Result Value Ref Range Status   Specimen Description BONE RIGHT FOOT  Final   Special Requests PATIENT ON FOLLOWING ZOSYN AND VANC  Final   Gram Stain   Final    FEW WBC PRESENT,BOTH PMN AND MONONUCLEAR NO ORGANISMS SEEN Performed at [facility], [address] [place], [place] [address]    Culture   Final    RARE ENTEROCOCCUS FAECALIS NO ANAEROBES ISOLATED; CULTURE IN PROGRESS FOR 5 DAYS    Report Status PENDING  Incomplete   Organism ID, Bacteria ENTEROCOCCUS FAECALIS   Final      Susceptibility   Enterococcus faecalis - MIC*    AMPICILLIN <=2 SENSITIVE Sensitive     VANCOMYCIN 1 SENSITIVE Sensitive     GENTAMICIN SYNERGY RESISTANT Resistant     * RARE ENTEROCOCCUS FAECALIS    Studies/Results: No results found.  Assessment/Plan: Charles [name] is a 57 y.o. male with recurrent osteomyelitis of R great toe and 1st MT after repeat surgery and presence of hardware.  Prior cx with enterobacter and Enterococcus. Treated with 2 weeks IV unasyn, oral cipro then prolonged augmentin and cipro finishing mid Dec 2018. Wound had healed over but still with toe deformity and swelling.  Acute redness and Infection recurred with hardware in place over last few days. CRP up but ESR ony 15. Now s/p removal of hardware. Cx with amp sensitive enterococcus.  Recommendations Will need to narrow abx to unasyn 3 gm q 6 hrs or 12 gm q 24 hours continuous for 6 weeks See abx order sheet. Thank you very much for the consult. Will follow with you.  Leonel Ramsay   08/25/2017, 2:50 PM

## 2017-08-25 NOTE — Discharge Summary (Addendum)
[facility] NAME: Charles [name]    MR#:  [id number]  DATE OF BIRTH:  Dec 18, 1960  DATE OF ADMISSION:  08/20/2017 ADMITTING PHYSICIAN: Epifanio Lesches, MD  DATE OF DISCHARGE:  08/25/17 PRIMARY CARE PHYSICIAN: Lavera Guise, MD    ADMISSION DIAGNOSIS:  Cellulitis of right lower extremity [L03.115] Osteomyelitis of right foot, unspecified type ([place]) [M86.9]  DISCHARGE DIAGNOSIS:  Active Problems:   Osteomyelitis ([place])   SECONDARY DIAGNOSIS:   Past Medical History:  Diagnosis Date  . Anginal pain ([place])   . Anxiety   . Arthritis   . Chronic back pain   . Coronary artery disease   . Depression   . GERD (gastroesophageal reflux disease)   . Hypertension   . Hypothyroidism   . Insomnia   . Low testosterone   . Myocardial infarction ([place])   . Peptic ulcer   . Shortness of breath   . Sleep apnea    has cpap-has not used since lost 140lb  . Spinal cord stimulator status    has a scs  . Wears glasses     HOSPITAL COURSE:   HPI  Charles [name]  is a 57 y.o. male with a known history multiple right foot surgeries and because of foot pain, swelling and redness starts getting worse for the past 3 4 days.  P patient had bunion surgery for the right great toe in 4259 complicated by fracture, hardware placement, infection before.  Followed by Dr. Vickki Muff.  Patient has been having swelling and redness since Monday now today he is having worsening swelling, unable to walk, patient redness extended to lower part of the right leg.  Patient has redness of the right foot and also lower part of right leg now.  No fever.  Patient is unable to walk today so he came to hospital.  Has pain 10 out of 10 in severity mainly along great toe and also the pain extending all the way to heel and also ankle area.  #Right foot cellulitis with possible underlying osteomyelitis versus a vascular necrosis given the history of multiple surgeries with  hardware in the past  Seen by podiatry Dr. Vickki Muff did debridment : 1. I&D with excision of distal first metatarsal head 2. Removal of retained hardware right foot 3. Extensor hallucis longus tendon lengthening with capsulotomy right first MTPJ on  right foot Wound cultures with rare enterococcus sensitive to vancomycin Seen by infectious disease who is recommending IV antibiotics for 6 weeks Discontinued IV antibiotic Zosyn and vancomycin and patient is started on Unasyn 12 g every 24 hours for the next 6 weeks.  Patient has to get weekly labs CBC with differential and creatinine while patient is on IV antibiotic Unasyn and results needs to be faxed over to Dr. Blane Ohara office at [phone] Continue with nonweightbearing on the right foot PICC line placed   podiatrist recommending dressing changes 3 times a week and outpatient follow-up  #acute on Chronic pain  Syndrome from right foot surgery  on MS Contin 15 mg p.o. twice daily, oxycodone 10 mg 6 times daily scheduled and oxycodone 10 mg every 4 hours as needed until seen by pain management on Friday Outpatient follow-up with pain management clinic on Friday as scheduled Pain management as needed.  Continue home medication MS Contin and oxycodone  # essential hypertension: Controlled, continue Coreg, amlodipine.  #. Depression: Continue Cymbalta, BuSpar.  #. History of BPH, testosterone deficiency: Patient is followed  by Dr. Bernardo Heater. Continue Flomax, testosterone injection once a week.  #Constipation: Patient is on daily MiraLAX.,  Add Colace    DISCHARGE CONDITIONS:   FAIR  CONSULTS OBTAINED:  Treatment Team:  Samara Deist, DPM Leonel Ramsay, MD   PROCEDURES  1. I&D with excision of distal first metatarsal head 2. Removal of retained hardware right foot 3. Extensor hallucis longus tendon lengthening with capsulotomy right first MTPJ   DRUG ALLERGIES:   Allergies  Allergen Reactions  .  Lisinopril Cough    DISCHARGE MEDICATIONS:   Allergies as of 08/25/2017      Reactions   Lisinopril Cough      Medication List    TAKE these medications   acetaminophen 325 MG tablet Commonly known as:  TYLENOL Take 2 tablets (650 mg total) by mouth every 6 (six) hours as needed for mild pain (or Fever >/= 101).   acidophilus Caps capsule Take 1 capsule by mouth daily. What changed:  Another medication with the same name was added. Make sure you understand how and when to take each.   acidophilus Caps capsule Take 1 capsule by mouth daily. Start taking on:  08/26/2017 What changed:  You were already taking a medication with the same name, and this prescription was added. Make sure you understand how and when to take each.   albuterol 108 (90 Base) MCG/ACT inhaler Commonly known as:  PROVENTIL HFA;VENTOLIN HFA Inhale 2 puffs into the lungs every 6 (six) hours as needed for wheezing or shortness of breath.   amLODipine 5 MG tablet Commonly known as:  NORVASC Take 5 mg by mouth daily.   Ampicillin-Sulbactam 3 (2-1) g Solr injection Commonly known as:  UNASYN 3GM Inject 12 g into the vein daily.   aspirin EC 81 MG tablet Take 1 tablet (81 mg total) by mouth daily.   atorvastatin 40 MG tablet Commonly known as:  LIPITOR Take 1 tablet (40 mg total) by mouth daily at 6 PM. What changed:  how much to take   B-12 PO Take 1 tablet by mouth daily.   busPIRone 15 MG tablet Commonly known as:  BUSPAR Take 30 mg by mouth 3 (three) times daily.   carvedilol 6.25 MG tablet Commonly known as:  COREG Take 1 tablet (6.25 mg total) by mouth 2 (two) times daily with a meal.   docusate sodium 100 MG capsule Commonly known as:  COLACE Take 1 capsule (100 mg total) by mouth 2 (two) times daily.   DULoxetine 60 MG capsule Commonly known as:  CYMBALTA Take 60 mg by mouth daily.   FIRST-DUKES MOUTHWASH Susp Swish and swallow with 7mls QID prn sore throat   Levomilnacipran HCl ER  40 MG Cp24 Take 40 mg by mouth daily.   levothyroxine 75 MCG tablet Commonly known as:  SYNTHROID, LEVOTHROID Take 75 mcg by mouth daily before breakfast.   lidocaine 5 % Commonly known as:  LIDODERM Place 1 patch onto the skin daily. Remove & Discard patch within 12 hours or as directed by MD   morphine 15 MG 12 hr tablet Commonly known as:  MS CONTIN Take 15-30 mg by mouth every 12 (twelve) hours. Pt takes two tablets in the morning and one at night.   MOVANTIK 25 MG Tabs tablet Generic drug:  naloxegol oxalate Take 25 mg by mouth daily.   multivitamin with minerals Tabs tablet Take 1 tablet by mouth daily.   mupirocin ointment 2 % Commonly known as:  [place] 1  application into the nose 2 (two) times daily. Total 3 days   ondansetron 8 MG tablet Commonly known as:  ZOFRAN Take 8 mg by mouth 3 (three) times daily.   Oxycodone HCl 10 MG Tabs Take 10 mg by mouth 6 (six) times daily. What changed:  Another medication with the same name was added. Make sure you understand how and when to take each.   oxyCODONE 5 MG immediate release tablet Commonly known as:  Oxy IR/ROXICODONE Take 1-2 tablets (5-10 mg total) by mouth every 4 (four) hours as needed for moderate pain, severe pain or breakthrough pain. What changed:  You were already taking a medication with the same name, and this prescription was added. Make sure you understand how and when to take each.   polyethylene glycol packet Commonly known as:  MIRALAX / GLYCOLAX Take 17 g daily as needed by mouth for mild constipation.   pregabalin 75 MG capsule Commonly known as:  LYRICA Take 1 capsule (75 mg total) by mouth 3 (three) times daily.   sildenafil 20 MG tablet Commonly known as:  REVATIO Take 20 mg by mouth daily as needed.   tamsulosin 0.4 MG Caps capsule Commonly known as:  FLOMAX Take 1 capsule (0.4 mg total) by mouth daily.   testosterone cypionate 200 MG/ML injection Commonly known as:   DEPOTESTOSTERONE CYPIONATE Inject 0.7 mLs (140 mg total) into the muscle once a week.   zolpidem 10 MG tablet Commonly known as:  AMBIEN Take 1 tablet (10 mg total) by mouth at bedtime.        DISCHARGE INSTRUCTIONS:   Continue wound  care 3 times a day as recommended by podiatry Continue home health, continue IV antibiotics for a total of 6 weeks, get weekly labs CBC with differential and creatinine and fax the results to Dr. Ola Spurr office at [phone] Follow-up with podiatry in 2 weeks Follow-up with infectious disease in 2 weeks Follow-up with primary care physician in a week Follow-up with pain management on Friday as scheduled   DIET:  Cardiac diet  DISCHARGE CONDITION:  Fair  ACTIVITY:  Activity as tolerated, non-weightbearing on the right foot  OXYGEN:  Home Oxygen: No.   Oxygen Delivery: room air  DISCHARGE LOCATION:  home   If you experience worsening of your admission symptoms, develop shortness of breath, life threatening emergency, suicidal or homicidal thoughts you must seek medical attention immediately by calling 911 or calling your MD immediately  if symptoms less severe.  You Must read complete instructions/literature along with all the possible adverse reactions/side effects for all the Medicines you take and that have been prescribed to you. Take any new Medicines after you have completely understood and accpet all the possible adverse reactions/side effects.   Please note  You were cared for by a hospitalist during your hospital stay. If you have any questions about your discharge medications or the care you received while you were in the hospital after you are discharged, you can call the unit and asked to speak with the hospitalist on call if the hospitalist that took care of you is not available. Once you are discharged, your primary care physician will handle any further medical issues. Please note that NO REFILLS for any discharge  medications will be authorized once you are discharged, as it is imperative that you return to your primary care physician (or establish a relationship with a primary care physician if you do not have one) for your aftercare needs so that they  can reassess your need for medications and monitor your lab values.     Today  Chief Complaint  Patient presents with  . Foot Pain   Patient is doing okay.  Pain is manageable with oxycodone as needed in addition to the scheduled MS Contin and oxycodone.  Sees pain management as an outpatient and has appointment on this Friday  ROS:  CONSTITUTIONAL: Denies fevers, chills. Denies any fatigue, weakness.  EYES: Denies blurry vision, double vision, eye pain. EARS, NOSE, THROAT: Denies tinnitus, ear pain, hearing loss. RESPIRATORY: Denies cough, wheeze, shortness of breath.  CARDIOVASCULAR: Denies chest pain, palpitations, edema.  GASTROINTESTINAL: Denies nausea, vomiting, diarrhea, abdominal pain. Denies bright red blood per rectum. GENITOURINARY: Denies dysuria, hematuria. ENDOCRINE: Denies nocturia or thyroid problems. HEMATOLOGIC AND LYMPHATIC: Denies easy bruising or bleeding. SKIN: Denies rash or lesion. MUSCULOSKELETAL: Right foot pain from surgery is tolerable NEUROLOGIC: Denies paralysis, paresthesias.  PSYCHIATRIC: Denies anxiety or depressive symptoms.   VITAL SIGNS:  Blood pressure (!) 150/90, pulse 72, temperature 97.8 F (36.6 C), temperature source Oral, resp. rate 18, height 6\' 1"  (1.854 m), weight (!) 141.3 kg (311 lb 8.2 oz), SpO2 95 %.  I/O:    Intake/Output Summary (Last 24 hours) at 08/25/2017 1536 Last data filed at 08/25/2017 1400 Gross per 24 hour  Intake -  Output 3900 ml  Net -3900 ml    PHYSICAL EXAMINATION:  GENERAL:  57 y.o.-year-old patient lying in the bed with no acute distress.  EYES: Pupils equal, round, reactive to light and accommodation. No scleral icterus. Extraocular muscles intact.  HEENT: Head  atraumatic, normocephalic. Oropharynx and nasopharynx clear.  NECK:  Supple, no jugular venous distention. No thyroid enlargement, no tenderness.  LUNGS: Normal breath sounds bilaterally, no wheezing, rales,rhonchi or crepitation. No use of accessory muscles of respiration.  CARDIOVASCULAR: S1, S2 normal. No murmurs, rubs, or gallops.  ABDOMEN: Soft, non-tender, non-distended. Bowel sounds present. No organomegaly or mass.  EXTREMITIES: Right foot and clean dressing no pedal edema, cyanosis, or clubbing.  NEUROLOGIC: Cranial nerves II through XII are intact. Muscle strength 5/5 in all extremities except right lower extremity . Sensation intact. Gait not checked.  PSYCHIATRIC: The patient is alert and oriented x 3.  SKIN: No obvious rash, lesion, or ulcer.   DATA REVIEW:   CBC Recent Labs  Lab 08/24/17 0458  WBC 4.1  HGB 14.2  HCT 43.6  PLT 225    Chemistries  Recent Labs  Lab 08/20/17 1418  08/23/17 0356  NA 133*   < > 136  K 3.6   < > 3.5  CL 100*   < > 101  CO2 27   < > 26  GLUCOSE 160*   < > 163*  BUN 9   < > 8  CREATININE 1.06   < > 1.02  CALCIUM 8.3*   < > 8.1*  AST 19  --   --   ALT 19  --   --   ALKPHOS 78  --   --   BILITOT 1.1  --   --    < > = values in this interval not displayed.    Cardiac Enzymes No results for input(s): TROPONINI in the last 168 hours.  Microbiology Results  Results for orders placed or performed during the hospital encounter of 08/20/17  Blood culture (routine x 2)     Status: None   Collection Time: 08/20/17  3:56 PM  Result Value Ref Range Status   Specimen Description BLOOD  LEFT ANTECUBITAL  Final   Special Requests   Final    BOTTLES DRAWN AEROBIC AND ANAEROBIC Blood Culture adequate volume   Culture   Final    NO GROWTH 5 DAYS Performed at [facility], [address] [place], [place] [address]    Report Status 08/25/2017 FINAL  Final  Blood culture (routine x 2)     Status: None   Collection Time: 08/20/17   3:56 PM  Result Value Ref Range Status   Specimen Description BLOOD RIGHT ANTECUBITAL  Final   Special Requests   Final    BOTTLES DRAWN AEROBIC AND ANAEROBIC Blood Culture adequate volume   Culture   Final    NO GROWTH 5 DAYS Performed at [facility], [address] [place], [place] [address]    Report Status 08/25/2017 FINAL  Final  MRSA PCR Screening     Status: Abnormal   Collection Time: 08/21/17 10:51 AM  Result Value Ref Range Status   MRSA by PCR POSITIVE (A) NEGATIVE Final    Comment:        The GeneXpert MRSA Assay (FDA approved for NASAL specimens only), is one component of a comprehensive MRSA colonization surveillance program. It is not intended to diagnose MRSA infection nor to guide or monitor treatment for MRSA infections. RESULT CALLED TO, READ BACK BY AND VERIFIED WITH: JENNIFER BOLE 08/21/17 @ 1336  [place] Performed at [facility], [address] [place], [place] [address]   Aerobic/Anaerobic Culture (surgical/deep wound)     Status: None (Preliminary result)   Collection Time: 08/22/17  9:30 AM  Result Value Ref Range Status   Specimen Description WOUND RIGHT FOOT FIRST METATARSAL  Final   Special Requests PATIENT ON FOLLOWING VANC AND ZOSYN  Final   Gram Stain   Final    MODERATE WBC PRESENT,BOTH PMN AND MONONUCLEAR NO ORGANISMS SEEN Performed at [facility], [place] [address] [place], [place] [address]    Culture   Final    RARE ENTEROCOCCUS FAECALIS NO ANAEROBES ISOLATED; CULTURE IN PROGRESS FOR 5 DAYS    Report Status PENDING  Incomplete   Organism ID, Bacteria ENTEROCOCCUS FAECALIS  Final      Susceptibility   Enterococcus faecalis - MIC*    AMPICILLIN <=2 SENSITIVE Sensitive     VANCOMYCIN 1 SENSITIVE Sensitive     GENTAMICIN SYNERGY RESISTANT Resistant     * RARE ENTEROCOCCUS FAECALIS  Aerobic/Anaerobic Culture (surgical/deep wound)     Status: None (Preliminary result)   Collection Time: 08/22/17  9:54 AM  Result  Value Ref Range Status   Specimen Description BONE RIGHT FOOT  Final   Special Requests PATIENT ON FOLLOWING ZOSYN AND VANC  Final   Gram Stain   Final    FEW WBC PRESENT,BOTH PMN AND MONONUCLEAR NO ORGANISMS SEEN Performed at [facility], [address] [place], [place] [address]    Culture   Final    RARE ENTEROCOCCUS FAECALIS NO ANAEROBES ISOLATED; CULTURE IN PROGRESS FOR 5 DAYS    Report Status PENDING  Incomplete   Organism ID, Bacteria ENTEROCOCCUS FAECALIS  Final      Susceptibility   Enterococcus faecalis - MIC*    AMPICILLIN <=2 SENSITIVE Sensitive     VANCOMYCIN 1 SENSITIVE Sensitive     GENTAMICIN SYNERGY RESISTANT Resistant     * RARE ENTEROCOCCUS FAECALIS    RADIOLOGY:  [place] Ekg Site Rite  Result Date: 08/22/2017 If Site [place] not  attached, placement could not be confirmed due to current cardiac rhythm.   EKG:   Orders placed or performed during the hospital encounter of 08/20/17  . EKG 12-Lead  . EKG 12-Lead  . EKG 12-Lead  . EKG 12-Lead  . EKG 12-Lead  . EKG 12-Lead      Management plans discussed with the patient, he is in agreement CODE STATUS:     Code Status Orders  (From admission, onward)        Start     Ordered   08/20/17 1528  Full code  Continuous     08/20/17 1530    Code Status History    Date Active Date Inactive Code Status Order ID Comments User Context   02/19/2017 1838 02/26/2017 1359 Full Code [id number]  Max Sane, MD Inpatient   02/17/2017 1711 02/17/2017 2034 Full Code [id number]  Samara Deist, [facility] Inpatient   02/10/2017 1516 02/10/2017 1856 Full Code [id number]  Samara Deist, [place] Inpatient   11/17/2016 1515 11/19/2016 1437 Full Code [id number]  Bettey Costa, MD Inpatient   07/10/2015 0500 07/11/2015 1714 Full Code [id number]  Lance Coon, MD Inpatient   06/27/2015 1359 06/28/2015 1213 Full Code [id number]  Yolonda Kida, MD Inpatient   06/25/2015 2010 06/27/2015 1359 Full Code [id number]  Vaughan Basta, MD  Inpatient    Advance Directive Documentation     Most Recent Value  Type of Advance Directive  Healthcare Power of Attorney, Living will  Pre-existing out of facility DNR order (yellow form or pink MOST form)  -  "MOST" Form in Place?  -      TOTAL TIME TAKING CARE OF THIS PATIENT: 42  minutes.   Note: This dictation was prepared with Dragon dictation along with smaller phrase technology. Any transcriptional errors that result from this process are unintentional.   @[facility] @  on 08/25/2017 at 3:36 PM  Between 7am to 6pm - Pager - [phone]  After 6pm go to [email or web address] - password [email or web address]  [facility]  Office  [phone]  CC: Primary care physician; Lavera Guise, MD

## 2017-08-25 NOTE — Progress Notes (Signed)
Pt discharged to home via wheelchair without incident per MD order accompanied by family. No change in patient from AM assessment. Pt repts pain "tolerable" on discharge. Prior to discharge, all discharge teachings done both written and verbal. All questions answered. Pt verbalizes understanding and agrees to comply. Pt discharged with prescriptions for Unasyn IV antibiotic, oxycodone, acidophilus and bactroban ointment. Pt home meds stored in pharmacy given to patient on discharge. PICC line flushed and saline locked.

## 2017-08-25 NOTE — Care Management Note (Signed)
Case Management Note  Patient Details  Name: Charles [name] MRN: [id number] Date of Birth: 01-10-1961  Subjective/Objective:  Spoke with patient regarding discharge planning. Patient states he has used [facility] in the past and they did a great job. He would like to use them again. Patient will need RN for dressing changes 3 x week per [facility]. [facility] orders. He will need home IV antibiotics. Has right PICC. Wife will be able to assist patient. Waiting on Iv antibiotic orders. Corene Cornea with [facility] updated.                    Action/Plan:   Expected Discharge Date:  08/25/17               Expected Discharge Plan:  [place]  In-House Referral:     Discharge planning Services  CM Consult  Post Acute Care Choice:  Home Health Choice offered to:  Patient  DME Arranged:    DME Agency:     HH Arranged:  RN [place] Agency:  [place]  Status of Service:  Completed, signed off  If discussed at [place] of Stay Meetings, dates discussed:    Additional Comments:  Jolly Mango, RN 08/25/2017, 10:56 AM

## 2017-08-25 NOTE — Progress Notes (Signed)
[place]: [phone]   If patient discharges after hours, please call [phone].   Florene Glen 08/25/2017, 12:42 PM

## 2017-08-25 NOTE — Discharge Instructions (Signed)
Continue wound  care 3 times a day as recommended by podiatry Continue home health, continue IV antibiotics for a total of 6 weeks Follow-up with podiatry in 2 weeks Follow-up with infectious disease in 2 weeks Follow-up with primary care physician in a week Follow-up with pain management on Friday as scheduled

## 2017-08-25 NOTE — Care Management (Signed)
Corene Cornea with [facility] made aware of IV antibiotics orders.

## 2017-08-25 NOTE — Progress Notes (Signed)
Infectious Disease Long Term IV Antibiotic Orders Charles [name] December 24, 1960  Diagnosis:  Foot osteomyelitis  Culture results Specimen Description BONE RIGHT FOOT   Special Requests PATIENT ON FOLLOWING ZOSYN AND VANC   Gram Stain FEW WBC PRESENT,BOTH PMN AND MONONUCLEAR  NO ORGANISMS SEEN  Performed at [facility], [address] [place], [place] [address]     Culture RARE ENTEROCOCCUS FAECALIS  NO ANAEROBES ISOLATED; CULTURE IN PROGRESS FOR 5 DAYS     Report Status PENDING   Organism ID, Bacteria ENTEROCOCCUS FAECALIS   Resulting Agency [facility]  Susceptibility    Enterococcus faecalis    MIC    AMPICILLIN <=2 SENSITIVE  Sensitive    GENTAMICIN SYNERGY RESISTANT  Resistant    VANCOMYCIN 1 SENSITIVE  Sensitive        LABS Lab Results  Component Value Date   CREATININE 1.02 08/23/2017   Lab Results  Component Value Date   WBC 4.1 08/24/2017   HGB 14.2 08/24/2017   HCT 43.6 08/24/2017   MCV 86.3 08/24/2017   PLT 225 08/24/2017   Lab Results  Component Value Date   ESRSEDRATE 15 08/20/2017   Lab Results  Component Value Date   CRP 12.6 (H) 08/21/2017    Allergies:  Allergies  Allergen Reactions  . Lisinopril Cough    Discharge antibiotics unasyn 12 grams every 24 hours continuous   PICC Care per protocol Labs weekly while on IV antibiotics -FAX weekly labs to [phone] CBC w diff   Cr   Planned duration of antibiotics 6 weeks  Stop date  June 9thFollow up clinic date 3 weeks   Leonel Ramsay, MD

## 2017-08-26 DIAGNOSIS — T8469XA Infection and inflammatory reaction due to internal fixation device of other site, initial encounter: Secondary | ICD-10-CM | POA: Diagnosis not present

## 2017-08-26 DIAGNOSIS — H05029 Osteomyelitis of unspecified orbit: Secondary | ICD-10-CM | POA: Diagnosis not present

## 2017-08-26 LAB — SURGICAL PATHOLOGY

## 2017-08-27 ENCOUNTER — Telehealth: Payer: Self-pay

## 2017-08-27 LAB — AEROBIC/ANAEROBIC CULTURE W GRAM STAIN (SURGICAL/DEEP WOUND)

## 2017-08-27 NOTE — Telephone Encounter (Signed)
Pt was hospitalized 5/3 and discharged on 5/8 because of right foot infection. [facility] [employer] case manager called saying that they reached out to him to get home health care because they do that for patients who are at an increased risk of getting hospitalized. He declined these services. Case Manager wanted to call and notify us.

## 2017-08-28 DIAGNOSIS — F329 Major depressive disorder, single episode, unspecified: Secondary | ICD-10-CM | POA: Diagnosis not present

## 2017-08-28 DIAGNOSIS — Z452 Encounter for adjustment and management of vascular access device: Secondary | ICD-10-CM | POA: Diagnosis not present

## 2017-08-28 DIAGNOSIS — G4730 Sleep apnea, unspecified: Secondary | ICD-10-CM | POA: Diagnosis not present

## 2017-08-28 DIAGNOSIS — L03115 Cellulitis of right lower limb: Secondary | ICD-10-CM | POA: Diagnosis not present

## 2017-08-28 DIAGNOSIS — G894 Chronic pain syndrome: Secondary | ICD-10-CM | POA: Diagnosis not present

## 2017-08-28 DIAGNOSIS — M545 Low back pain: Secondary | ICD-10-CM | POA: Diagnosis not present

## 2017-08-28 DIAGNOSIS — I2510 Atherosclerotic heart disease of native coronary artery without angina pectoris: Secondary | ICD-10-CM | POA: Diagnosis not present

## 2017-08-28 DIAGNOSIS — Z5181 Encounter for therapeutic drug level monitoring: Secondary | ICD-10-CM | POA: Diagnosis not present

## 2017-08-28 DIAGNOSIS — T8469XA Infection and inflammatory reaction due to internal fixation device of other site, initial encounter: Secondary | ICD-10-CM | POA: Diagnosis not present

## 2017-08-28 DIAGNOSIS — B952 Enterococcus as the cause of diseases classified elsewhere: Secondary | ICD-10-CM | POA: Diagnosis not present

## 2017-08-28 DIAGNOSIS — T84428A Displacement of other internal orthopedic devices, implants and grafts, initial encounter: Secondary | ICD-10-CM | POA: Diagnosis not present

## 2017-08-28 DIAGNOSIS — F419 Anxiety disorder, unspecified: Secondary | ICD-10-CM | POA: Diagnosis not present

## 2017-08-28 DIAGNOSIS — K219 Gastro-esophageal reflux disease without esophagitis: Secondary | ICD-10-CM | POA: Diagnosis not present

## 2017-08-29 LAB — AEROBIC/ANAEROBIC CULTURE W GRAM STAIN (SURGICAL/DEEP WOUND)

## 2017-08-30 ENCOUNTER — Telehealth: Payer: Self-pay

## 2017-08-30 DIAGNOSIS — T8469XA Infection and inflammatory reaction due to internal fixation device of other site, initial encounter: Secondary | ICD-10-CM | POA: Diagnosis not present

## 2017-08-30 DIAGNOSIS — F419 Anxiety disorder, unspecified: Secondary | ICD-10-CM | POA: Diagnosis not present

## 2017-08-30 DIAGNOSIS — I2510 Atherosclerotic heart disease of native coronary artery without angina pectoris: Secondary | ICD-10-CM | POA: Diagnosis not present

## 2017-08-30 DIAGNOSIS — B952 Enterococcus as the cause of diseases classified elsewhere: Secondary | ICD-10-CM | POA: Diagnosis not present

## 2017-08-30 DIAGNOSIS — F329 Major depressive disorder, single episode, unspecified: Secondary | ICD-10-CM | POA: Diagnosis not present

## 2017-08-30 DIAGNOSIS — G4730 Sleep apnea, unspecified: Secondary | ICD-10-CM | POA: Diagnosis not present

## 2017-08-30 DIAGNOSIS — T84428A Displacement of other internal orthopedic devices, implants and grafts, initial encounter: Secondary | ICD-10-CM | POA: Diagnosis not present

## 2017-08-30 DIAGNOSIS — M545 Low back pain: Secondary | ICD-10-CM | POA: Diagnosis not present

## 2017-08-30 DIAGNOSIS — Z5181 Encounter for therapeutic drug level monitoring: Secondary | ICD-10-CM | POA: Diagnosis not present

## 2017-08-30 DIAGNOSIS — L03115 Cellulitis of right lower limb: Secondary | ICD-10-CM | POA: Diagnosis not present

## 2017-08-30 DIAGNOSIS — G894 Chronic pain syndrome: Secondary | ICD-10-CM | POA: Diagnosis not present

## 2017-08-30 DIAGNOSIS — K219 Gastro-esophageal reflux disease without esophagitis: Secondary | ICD-10-CM | POA: Diagnosis not present

## 2017-08-30 DIAGNOSIS — Z452 Encounter for adjustment and management of vascular access device: Secondary | ICD-10-CM | POA: Diagnosis not present

## 2017-08-30 NOTE — Telephone Encounter (Signed)
Flagged on EMMI report for having questions regarding discharge papers.  Called and spoke with patient.  He reports he does not have any questions at this time.  I thanked him for his time and informed him that he would receive one more automated call in the next few days as a final check up.

## 2017-08-31 ENCOUNTER — Other Ambulatory Visit: Payer: Self-pay

## 2017-08-31 ENCOUNTER — Ambulatory Visit: Payer: BLUE CROSS/BLUE SHIELD | Attending: Infectious Diseases

## 2017-08-31 ENCOUNTER — Ambulatory Visit
Admission: RE | Admit: 2017-08-31 | Discharge: 2017-08-31 | Disposition: A | Discharge status: Home / Self Care | Payer: Self-pay | Source: Ambulatory Visit | Attending: Infectious Diseases | Admitting: Infectious Diseases

## 2017-08-31 DIAGNOSIS — Z452 Encounter for adjustment and management of vascular access device: Secondary | ICD-10-CM | POA: Insufficient documentation

## 2017-08-31 NOTE — Progress Notes (Signed)
PICC line placed left arm pt. Tolerated well .

## 2017-08-31 NOTE — ED Triage Notes (Signed)
To ER via POV. States he pulled his PICC line out accidentally today. Pt states that PICC line since Friday. Pt was giving himself antibiotics. Pump off now. No bleeding at site. Pt alert and oriented X4, active, cooperative, pt in NAD. RR even and unlabored, color WNL.

## 2017-08-31 NOTE — OR Nursing (Signed)
Pt arrived to [facility] rm 24. Picc line dangling from upper right arm.  when patient removed shirt he pulled the Picc all the way out.  No bleeding from site.

## 2017-08-31 NOTE — Progress Notes (Signed)
Spoke with [facility] RN who stated that Charles [name] would visit pts home tonite to place extension on PICC line so that he may resume IV antibiotics.

## 2017-09-01 DIAGNOSIS — Z452 Encounter for adjustment and management of vascular access device: Secondary | ICD-10-CM | POA: Diagnosis not present

## 2017-09-01 DIAGNOSIS — T8469XA Infection and inflammatory reaction due to internal fixation device of other site, initial encounter: Secondary | ICD-10-CM | POA: Diagnosis not present

## 2017-09-01 DIAGNOSIS — I2510 Atherosclerotic heart disease of native coronary artery without angina pectoris: Secondary | ICD-10-CM | POA: Diagnosis not present

## 2017-09-01 DIAGNOSIS — M545 Low back pain: Secondary | ICD-10-CM | POA: Diagnosis not present

## 2017-09-01 DIAGNOSIS — G4730 Sleep apnea, unspecified: Secondary | ICD-10-CM | POA: Diagnosis not present

## 2017-09-01 DIAGNOSIS — G894 Chronic pain syndrome: Secondary | ICD-10-CM | POA: Diagnosis not present

## 2017-09-01 DIAGNOSIS — T84428A Displacement of other internal orthopedic devices, implants and grafts, initial encounter: Secondary | ICD-10-CM | POA: Diagnosis not present

## 2017-09-01 DIAGNOSIS — L03115 Cellulitis of right lower limb: Secondary | ICD-10-CM | POA: Diagnosis not present

## 2017-09-01 DIAGNOSIS — Z5181 Encounter for therapeutic drug level monitoring: Secondary | ICD-10-CM | POA: Diagnosis not present

## 2017-09-01 DIAGNOSIS — F419 Anxiety disorder, unspecified: Secondary | ICD-10-CM | POA: Diagnosis not present

## 2017-09-01 DIAGNOSIS — B952 Enterococcus as the cause of diseases classified elsewhere: Secondary | ICD-10-CM | POA: Diagnosis not present

## 2017-09-01 DIAGNOSIS — K219 Gastro-esophageal reflux disease without esophagitis: Secondary | ICD-10-CM | POA: Diagnosis not present

## 2017-09-01 DIAGNOSIS — F329 Major depressive disorder, single episode, unspecified: Secondary | ICD-10-CM | POA: Diagnosis not present

## 2017-09-03 DIAGNOSIS — L97512 Non-pressure chronic ulcer of other part of right foot with fat layer exposed: Secondary | ICD-10-CM | POA: Diagnosis not present

## 2017-09-03 DIAGNOSIS — H05029 Osteomyelitis of unspecified orbit: Secondary | ICD-10-CM | POA: Diagnosis not present

## 2017-09-04 ENCOUNTER — Encounter: Payer: Self-pay | Admitting: Urology

## 2017-09-06 ENCOUNTER — Telehealth: Payer: Self-pay | Admitting: Radiology

## 2017-09-06 NOTE — Telephone Encounter (Signed)
Pt requests refills of tamsulosin x 1 year.

## 2017-09-06 NOTE — Telephone Encounter (Signed)
Per Dr Bernardo Heater, advised pt that since medication is working well, a refill will be sent to his pharmacy & appointment rescheduled for 1 year follow up for medication refill. Pt voices understanding.

## 2017-09-07 ENCOUNTER — Encounter: Payer: Self-pay | Admitting: Nurse Practitioner

## 2017-09-07 ENCOUNTER — Ambulatory Visit: Payer: BLUE CROSS/BLUE SHIELD | Admitting: Nurse Practitioner

## 2017-09-07 VITALS — BP 158/102 | HR 90 | Resp 16 | Ht 74.0 in | Wt 316.4 lb

## 2017-09-07 DIAGNOSIS — F5101 Primary insomnia: Secondary | ICD-10-CM | POA: Diagnosis not present

## 2017-09-07 DIAGNOSIS — F411 Generalized anxiety disorder: Secondary | ICD-10-CM

## 2017-09-07 DIAGNOSIS — I10 Essential (primary) hypertension: Secondary | ICD-10-CM

## 2017-09-07 DIAGNOSIS — F329 Major depressive disorder, single episode, unspecified: Secondary | ICD-10-CM | POA: Diagnosis not present

## 2017-09-07 DIAGNOSIS — E039 Hypothyroidism, unspecified: Secondary | ICD-10-CM | POA: Diagnosis not present

## 2017-09-07 MED ORDER — TAMSULOSIN HCL 0.4 MG PO CAPS: 0.4000 mg | ORAL_CAPSULE | Freq: Every day | ORAL | 3 refills | Status: DC

## 2017-09-07 MED ORDER — BUSPIRONE HCL 30 MG PO TABS: 30.0000 mg | ORAL_TABLET | Freq: Three times a day (TID) | ORAL | 3 refills | Status: DC

## 2017-09-07 MED ORDER — ZOLPIDEM TARTRATE 10 MG PO TABS: 10.0000 mg | ORAL_TABLET | Freq: Every day | ORAL | 3 refills | Status: DC

## 2017-09-07 MED ORDER — LEVOMILNACIPRAN HCL ER 40 MG PO CP24: 40.0000 mg | ORAL_CAPSULE | Freq: Every day | ORAL | 3 refills | Status: DC

## 2017-09-07 MED ORDER — LEVOTHYROXINE SODIUM 75 MCG PO TABS: 75.0000 ug | ORAL_TABLET | Freq: Every day | ORAL | 3 refills | Status: DC

## 2017-09-07 NOTE — Progress Notes (Signed)
[facility] [place], [place] [address]  Internal MEDICINE  Office Visit Note  Patient Name: Charles [name]  [id number]  [id number]  Date of Service: 10/02/2017   Pt is here for routine follow up.   Chief Complaint  Patient presents with  . Hypertension    Hypertension  This is a chronic problem. The current episode started more than 1 year ago. The problem is unchanged. The problem is resistant. Associated symptoms include anxiety, headaches and malaise/fatigue. Pertinent negatives include no shortness of breath. Agents associated with hypertension include thyroid hormones and steroids. Risk factors for coronary artery disease include dyslipidemia, family history, obesity, sedentary lifestyle and stress. Past treatments include ACE inhibitors, beta blockers and diuretics. The current treatment provides moderate improvement. Compliance problems include exercise.  Hypertensive end-organ damage includes CAD/MI. Identifiable causes of hypertension include sleep apnea and a thyroid problem.       Current Medication: Outpatient Encounter Medications as of 09/07/2017  Medication Sig Note  . acetaminophen (TYLENOL) 325 MG tablet Take 2 tablets (650 mg total) by mouth every 6 (six) hours as needed for mild pain (or Fever >/= 101).   Marland Kitchen acidophilus (RISAQUAD) CAPS capsule Take 1 capsule by mouth daily.   Marland Kitchen acidophilus (RISAQUAD) CAPS capsule Take 1 capsule by mouth daily.   Marland Kitchen amLODipine (NORVASC) 5 MG tablet Take 5 mg by mouth daily.   Marland Kitchen aspirin EC 81 MG tablet Take 1 tablet (81 mg total) by mouth daily.   Marland Kitchen atorvastatin (LIPITOR) 40 MG tablet Take 1 tablet (40 mg total) by mouth daily at 6 PM. (Patient taking differently: Take 20 mg by mouth daily at 6 PM. )   . busPIRone (BUSPAR) 30 MG tablet Take 1 tablet (30 mg total) by mouth 3 (three) times daily.   . carvedilol (COREG) 6.25 MG tablet Take 1 tablet (6.25 mg total) by mouth 2 (two) times daily with a meal.   .  Cyanocobalamin (B-12 PO) Take 1 tablet by mouth daily.   . Levomilnacipran HCl ER 40 MG CP24 Take 40 mg by mouth daily.   Marland Kitchen levothyroxine (SYNTHROID, LEVOTHROID) 75 MCG tablet Take 1 tablet (75 mcg total) by mouth daily before breakfast.   . lidocaine (LIDODERM) 5 % Place 1 patch onto the skin daily. Remove & Discard patch within 12 hours or as directed by MD   . morphine (MS CONTIN) 15 MG 12 hr tablet Take 15-30 mg by mouth every 12 (twelve) hours. Pt takes two tablets in the morning and one at night.   . Multiple Vitamin (MULTIVITAMIN WITH MINERALS) TABS tablet Take 1 tablet by mouth daily.   . ondansetron (ZOFRAN) 8 MG tablet Take 8 mg by mouth 3 (three) times daily.    . Oxycodone HCl 10 MG TABS Take 10 mg by mouth 6 (six) times daily. 08/14/2015: 6 times daily  . pregabalin (LYRICA) 75 MG capsule Take 1 capsule (75 mg total) by mouth 3 (three) times daily.   . sildenafil (REVATIO) 20 MG tablet Take 20 mg by mouth daily as needed.    . tamsulosin (FLOMAX) 0.4 MG CAPS capsule Take 1 capsule (0.4 mg total) by mouth daily.   Marland Kitchen testosterone cypionate (DEPOTESTOSTERONE CYPIONATE) 200 MG/ML injection Inject 0.7 mLs (140 mg total) into the muscle once a week.   . zolpidem (AMBIEN) 10 MG tablet Take 1 tablet (10 mg total) by mouth at bedtime.   . [DISCONTINUED] albuterol (PROVENTIL HFA;VENTOLIN HFA) 108 (90 Base) MCG/ACT inhaler Inhale  2 puffs into the lungs every 6 (six) hours as needed for wheezing or shortness of breath.   . [DISCONTINUED] busPIRone (BUSPAR) 15 MG tablet Take 30 mg by mouth 3 (three) times daily.    . [DISCONTINUED] DULoxetine (CYMBALTA) 60 MG capsule Take 60 mg by mouth daily.   . [DISCONTINUED] Levomilnacipran HCl ER 40 MG CP24 Take 40 mg by mouth daily.   . [DISCONTINUED] levothyroxine (SYNTHROID, LEVOTHROID) 75 MCG tablet Take 75 mcg by mouth daily before breakfast.    . [DISCONTINUED] naloxegol oxalate (MOVANTIK) 25 MG TABS tablet Take 25 mg by mouth daily.   . [DISCONTINUED]  zolpidem (AMBIEN) 10 MG tablet Take 1 tablet (10 mg total) by mouth at bedtime.   . Ampicillin-Sulbactam (UNASYN 3GM) 3 (2-1) g SOLR injection Inject 12 g into the vein daily. (Patient not taking: Reported on 09/07/2017)   . Diphenhyd-Hydrocort-Nystatin (FIRST-DUKES MOUTHWASH) SUSP Swish and swallow with 64mls QID prn sore throat (Patient not taking: Reported on 08/20/2017)   . docusate sodium (COLACE) 100 MG capsule Take 1 capsule (100 mg total) by mouth 2 (two) times daily. (Patient not taking: Reported on 09/07/2017)   . mupirocin ointment (BACTROBAN) 2 % Place 1 application into the nose 2 (two) times daily. Total 3 days   . polyethylene glycol (MIRALAX / GLYCOLAX) packet Take 17 g daily as needed by mouth for mild constipation. (Patient not taking: Reported on 08/20/2017)   . [DISCONTINUED] oxyCODONE (OXY IR/ROXICODONE) 5 MG immediate release tablet Take 1-2 tablets (5-10 mg total) by mouth every 4 (four) hours as needed for moderate pain, severe pain or breakthrough pain. (Patient not taking: Reported on 09/07/2017)    No facility-administered encounter medications on file as of 09/07/2017.     Surgical History: Past Surgical History:  Procedure Laterality Date  . BACK SURGERY    . CARDIAC CATHETERIZATION N/A 06/27/2015   Procedure: Left Heart Cath and Coronary Angiography;  Surgeon: Dionisio David, MD;  Location: [facility];  Service: Cardiovascular;  Laterality: N/A;  . CARDIAC CATHETERIZATION N/A 06/27/2015   Procedure: Coronary Stent Intervention;  Surgeon: Yolonda Kida, MD;  Location: [facility];  Service: Cardiovascular;  Laterality: N/A;  . CLOSED REDUCTION NASAL FRACTURE  12/22/2011   Procedure: CLOSED REDUCTION NASAL FRACTURE;  Surgeon: Ascencion Dike, MD;  Location: [facility];  Service: ENT;  Laterality: N/A;  closed reduction of nasal fracture  . FACIAL FRACTURE SURGERY     face-upper jaw with dental implants  . GASTRIC BYPASS  2011   has lost 140lb  .  IRRIGATION AND DEBRIDEMENT FOOT Right 02/21/2017   Procedure: IRRIGATION AND DEBRIDEMENT FOOT;  Surgeon: Sharlotte Alamo, DPM;  Location: [facility];  Service: Podiatry;  Laterality: Right;  . IRRIGATION AND DEBRIDEMENT FOOT N/A 08/22/2017   Procedure: IRRIGATION AND DEBRIDEMENT FOOT and hardware removal;  Surgeon: Samara Deist, DPM;  Location: [facility];  Service: Podiatry;  Laterality: N/A;  . LEFT HEART CATH AND CORONARY ANGIOGRAPHY N/A 06/25/2016   Procedure: Left Heart Cath and Coronary Angiography;  Surgeon: Corey Skains, MD;  Location: [facility];  Service: Cardiovascular;  Laterality: N/A;  . METATARSAL OSTEOTOMY Right 02/10/2017   Procedure: METATARSAL OSTEOTOMY-GREAT TOE AND 1ST METATARSAL;  Surgeon: Samara Deist, DPM;  Location: [facility];  Service: Podiatry;  Laterality: Right;  . ORIF TOE FRACTURE Right 02/17/2017   Procedure: Open reduction with internal fixation displaced osteotomy and fracture first metatarsal;  Surgeon: Samara Deist, DPM;  Location: [facility];  Service: Podiatry;  Laterality: Right;  IVA / POPLITEAL  . REPAIR TENDONS FOOT  2002   rt foot  . SPINAL CORD STIMULATOR IMPLANT  6/13    Medical History: Past Medical History:  Diagnosis Date  . Anginal pain ([place])   . Anxiety   . Arthritis   . Chronic back pain   . Coronary artery disease   . Depression   . GERD (gastroesophageal reflux disease)   . Hypertension   . Hypothyroidism   . Insomnia   . Low testosterone   . Myocardial infarction ([place])   . Peptic ulcer   . Shortness of breath   . Sleep apnea    has cpap-has not used since lost 140lb  . Spinal cord stimulator status    has a scs  . Wears glasses     Family History: Family History  Problem Relation Age of Onset  . Diabetes Father     Social History   Socioeconomic History  . Marital status: Married    Spouse name: Not on file  . Number of children: Not on file  . Years of education: Not on file  . Highest  education level: Not on file  Occupational History  . Not on file  Social Needs  . Financial resource strain: Not on file  . Food insecurity:    Worry: Not on file    Inability: Not on file  . Transportation needs:    Medical: Not on file    Non-medical: Not on file  Tobacco Use  . Smoking status: Never Smoker  . Smokeless tobacco: Never Used  Substance and Sexual Activity  . Alcohol use: No    Alcohol/week: 0.0 oz    Comment: not in 4 yr  . Drug use: No  . Sexual activity: Never  Lifestyle  . Physical activity:    Days per week: Not on file    Minutes per session: Not on file  . Stress: Not on file  Relationships  . Social connections:    Talks on phone: Not on file    Gets together: Not on file    Attends religious service: Not on file    Active member of club or organization: Not on file    Attends meetings of clubs or organizations: Not on file    Relationship status: Not on file  . Intimate partner violence:    Fear of current or ex partner: Not on file    Emotionally abused: Not on file    Physically abused: Not on file    Forced sexual activity: Not on file  Other Topics Concern  . Not on file  Social History Narrative  . Not on file      Review of Systems  Constitutional: Positive for malaise/fatigue. Negative for appetite change, chills, fatigue and fever.  HENT: Negative for congestion, postnasal drip, rhinorrhea, sinus pressure, sneezing and sore throat.   Eyes: Negative.   Respiratory: Negative for cough, shortness of breath and wheezing.   Cardiovascular:       Bp slightly elevated today  Gastrointestinal: Positive for constipation. Negative for abdominal pain, diarrhea, nausea and vomiting.  Endocrine: Negative for cold intolerance, heat intolerance, polydipsia, polyphagia and polyuria.  Genitourinary: Negative.   Musculoskeletal: Positive for arthralgias and back pain.       Chronic back pain and joint issues. Sees pain management regularly.   Skin: Negative for rash.  Allergic/Immunologic: Positive for environmental allergies.  Neurological: Positive  for headaches.  Hematological: Negative for adenopathy.  Psychiatric/Behavioral: Positive for sleep disturbance.   Today's Vitals   09/07/17 1159  BP: (!) 158/102  Pulse: 90  Resp: 16  SpO2: 95%  Weight: (!) 316 lb 6.4 oz (143.5 kg)  Height: 6\' 2"  (1.88 m)    Physical Exam  Constitutional: He is oriented to person, place, and time. He appears well-developed and well-nourished.  HENT:  Head: Normocephalic and atraumatic.  Right Ear: Tympanic membrane is erythematous and bulging.  Left Ear: Tympanic membrane is erythematous and bulging.  Nose: Rhinorrhea present. Right sinus exhibits frontal sinus tenderness. Left sinus exhibits frontal sinus tenderness.  Mouth/Throat: Posterior oropharyngeal edema present.  Eyes: Pupils are equal, round, and reactive to light. Conjunctivae and EOM are normal.  Neck: Normal range of motion. Neck supple. No thyromegaly present.  Cardiovascular: Normal rate, regular rhythm and normal heart sounds.  Pulmonary/Chest: Effort normal and breath sounds normal. He has no wheezes.  Congested, non-productive cough present  Abdominal: Soft. Bowel sounds are normal. There is no tenderness.  Musculoskeletal: Normal range of motion.  Lymphadenopathy:    He has no cervical adenopathy.  Neurological: He is alert and oriented to person, place, and time.  Skin: Skin is warm and dry.  Psychiatric: He has a normal mood and affect. His behavior is normal. Judgment and thought content normal.  Nursing note and vitals reviewed.  Assessment/Plan:  1. Essential hypertension Generally well controlled. Continue BP medication as prescribed   2. Acquired hypothyroidism Continue levothyroxine as prescribed.  - levothyroxine (SYNTHROID, LEVOTHROID) 75 MCG tablet; Take 1 tablet (75 mcg total) by mouth daily before breakfast.  Dispense: 30 tablet; Refill: 3  3.  Major depression, chronic Fetzima should be continued as prescribed.  - Levomilnacipran HCl ER 40 MG CP24; Take 40 mg by mouth daily.  Dispense: 30 capsule; Refill: 3  4. Primary insomnia May continue ambien at bedtime as needed. New rx provided today  - zolpidem (AMBIEN) 10 MG tablet; Take 1 tablet (10 mg total) by mouth at bedtime.  Dispense: 30 tablet; Refill: 3  5. Generalized anxiety disorder Continue buspirone 30mg  three times daily if needed for acute anxiety. Refills provided today. - busPIRone (BUSPAR) 30 MG tablet; Take 1 tablet (30 mg total) by mouth 3 (three) times daily.  Dispense: 90 tablet; Refill: 3   General Counseling: Eligh verbalizes understanding of the findings of todays visit and agrees with plan of treatment. I have discussed any further diagnostic evaluation that may be needed or ordered today. We also reviewed his medications today. he has been encouraged to call the office with any questions or concerns that should arise related to todays visit.    Counseling:  This patient was seen by Leretha Pol, FNP- C in Collaboration with Dr Lavera Guise as a part of collaborative care agreement    Meds ordered this encounter  Medications  . zolpidem (AMBIEN) 10 MG tablet    Sig: Take 1 tablet (10 mg total) by mouth at bedtime.    Dispense:  30 tablet    Refill:  3    Order Specific Question:   Supervising Provider    Answer:   Lavera Guise [[id number]]  . levothyroxine (SYNTHROID, LEVOTHROID) 75 MCG tablet    Sig: Take 1 tablet (75 mcg total) by mouth daily before breakfast.    Dispense:  30 tablet    Refill:  3    Order Specific Question:   Supervising Provider    Answer:  KHAN, FOZIA M [[id number]]  . Levomilnacipran HCl ER 40 MG CP24    Sig: Take 40 mg by mouth daily.    Dispense:  30 capsule    Refill:  3    Order Specific Question:   Supervising Provider    Answer:   Lavera Guise [[id number]]  . busPIRone (BUSPAR) 30 MG tablet    Sig: Take 1 tablet (30 mg total)  by mouth 3 (three) times daily.    Dispense:  90 tablet    Refill:  3    Order Specific Question:   Supervising Provider    Answer:   Lavera Guise [[id number]]    Time spent: 42 Minutes     Dr Lavera Guise Internal medicine

## 2017-09-07 NOTE — Telephone Encounter (Signed)
Rx sent 

## 2017-09-08 ENCOUNTER — Ambulatory Visit: Payer: Self-pay | Admitting: Urology

## 2017-09-08 DIAGNOSIS — F329 Major depressive disorder, single episode, unspecified: Secondary | ICD-10-CM | POA: Diagnosis not present

## 2017-09-08 DIAGNOSIS — T8469XA Infection and inflammatory reaction due to internal fixation device of other site, initial encounter: Secondary | ICD-10-CM | POA: Diagnosis not present

## 2017-09-08 DIAGNOSIS — M545 Low back pain: Secondary | ICD-10-CM | POA: Diagnosis not present

## 2017-09-08 DIAGNOSIS — L03115 Cellulitis of right lower limb: Secondary | ICD-10-CM | POA: Diagnosis not present

## 2017-09-08 DIAGNOSIS — I2510 Atherosclerotic heart disease of native coronary artery without angina pectoris: Secondary | ICD-10-CM | POA: Diagnosis not present

## 2017-09-08 DIAGNOSIS — Z452 Encounter for adjustment and management of vascular access device: Secondary | ICD-10-CM | POA: Diagnosis not present

## 2017-09-08 DIAGNOSIS — B952 Enterococcus as the cause of diseases classified elsewhere: Secondary | ICD-10-CM | POA: Diagnosis not present

## 2017-09-08 DIAGNOSIS — G4730 Sleep apnea, unspecified: Secondary | ICD-10-CM | POA: Diagnosis not present

## 2017-09-08 DIAGNOSIS — F419 Anxiety disorder, unspecified: Secondary | ICD-10-CM | POA: Diagnosis not present

## 2017-09-08 DIAGNOSIS — G894 Chronic pain syndrome: Secondary | ICD-10-CM | POA: Diagnosis not present

## 2017-09-08 DIAGNOSIS — T84428A Displacement of other internal orthopedic devices, implants and grafts, initial encounter: Secondary | ICD-10-CM | POA: Diagnosis not present

## 2017-09-08 DIAGNOSIS — K219 Gastro-esophageal reflux disease without esophagitis: Secondary | ICD-10-CM | POA: Diagnosis not present

## 2017-09-08 DIAGNOSIS — Z5181 Encounter for therapeutic drug level monitoring: Secondary | ICD-10-CM | POA: Diagnosis not present

## 2017-09-09 DIAGNOSIS — L97512 Non-pressure chronic ulcer of other part of right foot with fat layer exposed: Secondary | ICD-10-CM | POA: Diagnosis not present

## 2017-09-10 DIAGNOSIS — M86171 Other acute osteomyelitis, right ankle and foot: Secondary | ICD-10-CM | POA: Diagnosis not present

## 2017-09-10 DIAGNOSIS — H05029 Osteomyelitis of unspecified orbit: Secondary | ICD-10-CM | POA: Diagnosis not present

## 2017-09-10 DIAGNOSIS — Z792 Long term (current) use of antibiotics: Secondary | ICD-10-CM | POA: Diagnosis not present

## 2017-09-15 DIAGNOSIS — G4730 Sleep apnea, unspecified: Secondary | ICD-10-CM | POA: Diagnosis not present

## 2017-09-15 DIAGNOSIS — B952 Enterococcus as the cause of diseases classified elsewhere: Secondary | ICD-10-CM | POA: Diagnosis not present

## 2017-09-15 DIAGNOSIS — F329 Major depressive disorder, single episode, unspecified: Secondary | ICD-10-CM | POA: Diagnosis not present

## 2017-09-15 DIAGNOSIS — Z452 Encounter for adjustment and management of vascular access device: Secondary | ICD-10-CM | POA: Diagnosis not present

## 2017-09-15 DIAGNOSIS — I2510 Atherosclerotic heart disease of native coronary artery without angina pectoris: Secondary | ICD-10-CM | POA: Diagnosis not present

## 2017-09-15 DIAGNOSIS — Z5181 Encounter for therapeutic drug level monitoring: Secondary | ICD-10-CM | POA: Diagnosis not present

## 2017-09-15 DIAGNOSIS — M545 Low back pain: Secondary | ICD-10-CM | POA: Diagnosis not present

## 2017-09-15 DIAGNOSIS — T84428A Displacement of other internal orthopedic devices, implants and grafts, initial encounter: Secondary | ICD-10-CM | POA: Diagnosis not present

## 2017-09-15 DIAGNOSIS — K219 Gastro-esophageal reflux disease without esophagitis: Secondary | ICD-10-CM | POA: Diagnosis not present

## 2017-09-15 DIAGNOSIS — F419 Anxiety disorder, unspecified: Secondary | ICD-10-CM | POA: Diagnosis not present

## 2017-09-15 DIAGNOSIS — L03115 Cellulitis of right lower limb: Secondary | ICD-10-CM | POA: Diagnosis not present

## 2017-09-15 DIAGNOSIS — G894 Chronic pain syndrome: Secondary | ICD-10-CM | POA: Diagnosis not present

## 2017-09-15 DIAGNOSIS — T8469XA Infection and inflammatory reaction due to internal fixation device of other site, initial encounter: Secondary | ICD-10-CM | POA: Diagnosis not present

## 2017-09-17 DIAGNOSIS — H05029 Osteomyelitis of unspecified orbit: Secondary | ICD-10-CM | POA: Diagnosis not present

## 2017-09-21 ENCOUNTER — Encounter: Admission: RE | Payer: Self-pay | Source: Ambulatory Visit

## 2017-09-21 ENCOUNTER — Ambulatory Visit
Admission: RE | Admit: 2017-09-21 | Payer: BLUE CROSS/BLUE SHIELD | Source: Ambulatory Visit | Admitting: Internal Medicine

## 2017-09-21 DIAGNOSIS — L03115 Cellulitis of right lower limb: Secondary | ICD-10-CM | POA: Diagnosis not present

## 2017-09-21 DIAGNOSIS — T8460XA Infection and inflammatory reaction due to internal fixation device of unspecified site, initial encounter: Secondary | ICD-10-CM | POA: Diagnosis not present

## 2017-09-21 DIAGNOSIS — H05029 Osteomyelitis of unspecified orbit: Secondary | ICD-10-CM | POA: Diagnosis not present

## 2017-09-21 SURGERY — COLONOSCOPY WITH PROPOFOL
Anesthesia: General

## 2017-09-22 DIAGNOSIS — T84428A Displacement of other internal orthopedic devices, implants and grafts, initial encounter: Secondary | ICD-10-CM | POA: Diagnosis not present

## 2017-09-22 DIAGNOSIS — L03115 Cellulitis of right lower limb: Secondary | ICD-10-CM | POA: Diagnosis not present

## 2017-09-22 DIAGNOSIS — T8469XA Infection and inflammatory reaction due to internal fixation device of other site, initial encounter: Secondary | ICD-10-CM | POA: Diagnosis not present

## 2017-09-22 DIAGNOSIS — F329 Major depressive disorder, single episode, unspecified: Secondary | ICD-10-CM | POA: Diagnosis not present

## 2017-09-22 DIAGNOSIS — F419 Anxiety disorder, unspecified: Secondary | ICD-10-CM | POA: Diagnosis not present

## 2017-09-22 DIAGNOSIS — G894 Chronic pain syndrome: Secondary | ICD-10-CM | POA: Diagnosis not present

## 2017-09-22 DIAGNOSIS — K219 Gastro-esophageal reflux disease without esophagitis: Secondary | ICD-10-CM | POA: Diagnosis not present

## 2017-09-22 DIAGNOSIS — Z5181 Encounter for therapeutic drug level monitoring: Secondary | ICD-10-CM | POA: Diagnosis not present

## 2017-09-22 DIAGNOSIS — B952 Enterococcus as the cause of diseases classified elsewhere: Secondary | ICD-10-CM | POA: Diagnosis not present

## 2017-09-22 DIAGNOSIS — I2510 Atherosclerotic heart disease of native coronary artery without angina pectoris: Secondary | ICD-10-CM | POA: Diagnosis not present

## 2017-09-22 DIAGNOSIS — M545 Low back pain: Secondary | ICD-10-CM | POA: Diagnosis not present

## 2017-09-22 DIAGNOSIS — G4730 Sleep apnea, unspecified: Secondary | ICD-10-CM | POA: Diagnosis not present

## 2017-09-22 DIAGNOSIS — Z452 Encounter for adjustment and management of vascular access device: Secondary | ICD-10-CM | POA: Diagnosis not present

## 2017-09-24 DIAGNOSIS — H05029 Osteomyelitis of unspecified orbit: Secondary | ICD-10-CM | POA: Diagnosis not present

## 2017-09-28 ENCOUNTER — Ambulatory Visit
Admission: RE | Admit: 2017-09-28 | Payer: BLUE CROSS/BLUE SHIELD | Source: Ambulatory Visit | Admitting: Internal Medicine

## 2017-09-28 ENCOUNTER — Encounter: Admission: RE | Payer: Self-pay | Source: Ambulatory Visit

## 2017-09-28 DIAGNOSIS — G4730 Sleep apnea, unspecified: Secondary | ICD-10-CM | POA: Diagnosis not present

## 2017-09-28 DIAGNOSIS — B952 Enterococcus as the cause of diseases classified elsewhere: Secondary | ICD-10-CM | POA: Diagnosis not present

## 2017-09-28 DIAGNOSIS — L03115 Cellulitis of right lower limb: Secondary | ICD-10-CM | POA: Diagnosis not present

## 2017-09-28 DIAGNOSIS — M545 Low back pain: Secondary | ICD-10-CM | POA: Diagnosis not present

## 2017-09-28 DIAGNOSIS — Z5181 Encounter for therapeutic drug level monitoring: Secondary | ICD-10-CM | POA: Diagnosis not present

## 2017-09-28 DIAGNOSIS — F419 Anxiety disorder, unspecified: Secondary | ICD-10-CM | POA: Diagnosis not present

## 2017-09-28 DIAGNOSIS — T8469XA Infection and inflammatory reaction due to internal fixation device of other site, initial encounter: Secondary | ICD-10-CM | POA: Diagnosis not present

## 2017-09-28 DIAGNOSIS — K219 Gastro-esophageal reflux disease without esophagitis: Secondary | ICD-10-CM | POA: Diagnosis not present

## 2017-09-28 DIAGNOSIS — F329 Major depressive disorder, single episode, unspecified: Secondary | ICD-10-CM | POA: Diagnosis not present

## 2017-09-28 DIAGNOSIS — G894 Chronic pain syndrome: Secondary | ICD-10-CM | POA: Diagnosis not present

## 2017-09-28 DIAGNOSIS — I2510 Atherosclerotic heart disease of native coronary artery without angina pectoris: Secondary | ICD-10-CM | POA: Diagnosis not present

## 2017-09-28 DIAGNOSIS — T84428A Displacement of other internal orthopedic devices, implants and grafts, initial encounter: Secondary | ICD-10-CM | POA: Diagnosis not present

## 2017-09-28 DIAGNOSIS — Z452 Encounter for adjustment and management of vascular access device: Secondary | ICD-10-CM | POA: Diagnosis not present

## 2017-09-28 SURGERY — COLONOSCOPY WITH PROPOFOL
Anesthesia: General

## 2017-09-29 ENCOUNTER — Other Ambulatory Visit: Payer: Self-pay | Admitting: Nurse Practitioner

## 2017-09-29 DIAGNOSIS — J4520 Mild intermittent asthma, uncomplicated: Secondary | ICD-10-CM

## 2017-09-30 ENCOUNTER — Encounter: Payer: Self-pay | Admitting: Nurse Practitioner

## 2017-09-30 DIAGNOSIS — F411 Generalized anxiety disorder: Secondary | ICD-10-CM | POA: Insufficient documentation

## 2017-09-30 MED ORDER — ALBUTEROL SULFATE HFA 108 (90 BASE) MCG/ACT IN AERS: 2.0000 | INHALATION_SPRAY | Freq: Four times a day (QID) | RESPIRATORY_TRACT | 0 refills | Status: DC | PRN

## 2017-10-01 ENCOUNTER — Other Ambulatory Visit: Payer: Self-pay | Admitting: Nurse Practitioner

## 2017-10-01 DIAGNOSIS — T402X5A Adverse effect of other opioids, initial encounter: Principal | ICD-10-CM

## 2017-10-01 DIAGNOSIS — K5903 Drug induced constipation: Secondary | ICD-10-CM

## 2017-10-01 MED ORDER — NALOXEGOL OXALATE 25 MG PO TABS: 25.0000 mg | ORAL_TABLET | Freq: Every day | ORAL | 3 refills | Status: DC

## 2017-10-01 NOTE — Progress Notes (Signed)
Refilled movantik and sent to [facility]

## 2017-10-07 ENCOUNTER — Telehealth: Payer: Self-pay | Admitting: Internal Medicine

## 2017-10-07 NOTE — Telephone Encounter (Signed)
Prior auth for Charles [name] has been approved 10/05/17-10/04/18

## 2017-10-13 ENCOUNTER — Encounter: Payer: Self-pay | Admitting: Nurse Practitioner

## 2017-10-13 ENCOUNTER — Other Ambulatory Visit: Payer: Self-pay | Admitting: Internal Medicine

## 2017-10-13 DIAGNOSIS — T402X5A Adverse effect of other opioids, initial encounter: Principal | ICD-10-CM

## 2017-10-13 DIAGNOSIS — K5903 Drug induced constipation: Secondary | ICD-10-CM

## 2017-10-13 MED ORDER — NALOXEGOL OXALATE 25 MG PO TABS: 25.0000 mg | ORAL_TABLET | Freq: Every day | ORAL | 3 refills | Status: DC

## 2017-10-15 ENCOUNTER — Encounter: Payer: Self-pay | Admitting: Nurse Practitioner

## 2017-10-18 ENCOUNTER — Other Ambulatory Visit: Payer: Self-pay | Admitting: Nurse Practitioner

## 2017-10-18 DIAGNOSIS — F329 Major depressive disorder, single episode, unspecified: Secondary | ICD-10-CM

## 2017-10-18 DIAGNOSIS — F5101 Primary insomnia: Secondary | ICD-10-CM

## 2017-10-18 MED ORDER — LEVOMILNACIPRAN HCL ER 40 MG PO CP24: 40.0000 mg | ORAL_CAPSULE | Freq: Every day | ORAL | 3 refills | Status: DC

## 2017-10-18 MED ORDER — ZOLPIDEM TARTRATE 10 MG PO TABS: 10.0000 mg | ORAL_TABLET | Freq: Every day | ORAL | 3 refills | Status: DC

## 2017-10-18 NOTE — Progress Notes (Signed)
New prescriptions for zolpidem 10mg  qhs and fetzima 40mg  daily [facility].

## 2017-10-26 ENCOUNTER — Emergency Department
Admission: EM | Admit: 2017-10-26 | Discharge: 2017-10-26 | Disposition: A | Discharge status: Home / Self Care | Payer: BLUE CROSS/BLUE SHIELD

## 2017-10-26 DIAGNOSIS — M869 Osteomyelitis, unspecified: Secondary | ICD-10-CM | POA: Diagnosis not present

## 2017-11-12 ENCOUNTER — Other Ambulatory Visit: Payer: Self-pay | Admitting: Nurse Practitioner

## 2017-11-12 ENCOUNTER — Other Ambulatory Visit: Payer: Self-pay | Admitting: Urology

## 2017-11-12 DIAGNOSIS — J4520 Mild intermittent asthma, uncomplicated: Secondary | ICD-10-CM

## 2017-11-16 ENCOUNTER — Other Ambulatory Visit: Payer: Self-pay

## 2017-11-16 ENCOUNTER — Other Ambulatory Visit: Payer: Self-pay | Admitting: Internal Medicine

## 2017-11-16 DIAGNOSIS — F411 Generalized anxiety disorder: Secondary | ICD-10-CM

## 2017-11-16 MED ORDER — ALBUTEROL SULFATE HFA 108 (90 BASE) MCG/ACT IN AERS: 2.0000 | INHALATION_SPRAY | Freq: Four times a day (QID) | RESPIRATORY_TRACT | 0 refills | Status: DC | PRN

## 2017-11-16 MED ORDER — AMLODIPINE BESYLATE 5 MG PO TABS: 5.0000 mg | ORAL_TABLET | Freq: Every day | ORAL | 5 refills | Status: DC

## 2017-11-17 ENCOUNTER — Other Ambulatory Visit: Payer: Self-pay

## 2017-11-17 MED ORDER — ATORVASTATIN CALCIUM 40 MG PO TABS: 40.0000 mg | ORAL_TABLET | Freq: Every day | ORAL | 6 refills | Status: DC

## 2017-11-22 ENCOUNTER — Other Ambulatory Visit: Payer: Self-pay | Admitting: Urology

## 2017-11-23 ENCOUNTER — Other Ambulatory Visit: Payer: Self-pay | Admitting: Nurse Practitioner

## 2017-11-23 DIAGNOSIS — M792 Neuralgia and neuritis, unspecified: Secondary | ICD-10-CM

## 2017-11-23 MED ORDER — PREGABALIN 75 MG PO CAPS: 75.0000 mg | ORAL_CAPSULE | Freq: Three times a day (TID) | ORAL | 3 refills | Status: DC

## 2017-11-23 NOTE — Telephone Encounter (Signed)
Pt is requesting refill on testosterone, please advise.

## 2017-11-23 NOTE — Progress Notes (Signed)
Refilled lyrica 75mg  TID for neruopathy per pharmacy request.

## 2017-12-05 MED ORDER — TESTOSTERONE CYPIONATE 200 MG/ML IM SOLN: 140.0000 mg | INTRAMUSCULAR | 0 refills | Status: DC

## 2018-01-18 ENCOUNTER — Encounter: Payer: Self-pay | Admitting: Adult Health

## 2018-01-20 ENCOUNTER — Encounter: Payer: Self-pay | Admitting: Adult Health

## 2018-01-24 ENCOUNTER — Encounter: Payer: Self-pay | Admitting: Nurse Practitioner

## 2018-01-24 NOTE — Telephone Encounter (Signed)
Can we take care of this

## 2018-02-01 ENCOUNTER — Encounter: Payer: Self-pay | Admitting: Adult Health

## 2018-02-01 ENCOUNTER — Other Ambulatory Visit: Payer: Self-pay

## 2018-02-01 DIAGNOSIS — E039 Hypothyroidism, unspecified: Secondary | ICD-10-CM

## 2018-02-01 MED ORDER — LEVOTHYROXINE SODIUM 75 MCG PO TABS: 75.0000 ug | ORAL_TABLET | Freq: Every day | ORAL | 3 refills | Status: DC

## 2018-02-02 ENCOUNTER — Telehealth: Payer: Self-pay

## 2018-02-02 NOTE — Telephone Encounter (Signed)
lmom to call us back

## 2018-02-04 ENCOUNTER — Other Ambulatory Visit: Payer: Self-pay

## 2018-02-04 DIAGNOSIS — F329 Major depressive disorder, single episode, unspecified: Secondary | ICD-10-CM

## 2018-02-04 MED ORDER — LEVOMILNACIPRAN HCL ER 40 MG PO CP24: 40.0000 mg | ORAL_CAPSULE | Freq: Every day | ORAL | 0 refills | Status: DC

## 2018-02-21 ENCOUNTER — Encounter: Payer: Self-pay | Admitting: Nurse Practitioner

## 2018-03-04 ENCOUNTER — Encounter: Payer: Self-pay | Admitting: Nurse Practitioner

## 2018-03-06 NOTE — Telephone Encounter (Signed)
Hey. Can you get him a follow up visit? thanks

## 2018-07-07 ENCOUNTER — Ambulatory Visit: Payer: Self-pay | Admitting: Nurse Practitioner

## 2018-07-11 ENCOUNTER — Encounter: Payer: Self-pay | Admitting: Nurse Practitioner

## 2018-07-11 ENCOUNTER — Other Ambulatory Visit: Payer: Self-pay

## 2018-07-11 ENCOUNTER — Ambulatory Visit: Payer: Self-pay | Admitting: Nurse Practitioner

## 2018-07-11 VITALS — BP 150/94 | HR 67 | Temp 98.0°F | Resp 16 | Ht 74.0 in | Wt 268.4 lb

## 2018-07-11 DIAGNOSIS — J4520 Mild intermittent asthma, uncomplicated: Secondary | ICD-10-CM

## 2018-07-11 DIAGNOSIS — E782 Mixed hyperlipidemia: Secondary | ICD-10-CM

## 2018-07-11 DIAGNOSIS — F411 Generalized anxiety disorder: Secondary | ICD-10-CM

## 2018-07-11 DIAGNOSIS — S91301A Unspecified open wound, right foot, initial encounter: Secondary | ICD-10-CM

## 2018-07-11 DIAGNOSIS — E039 Hypothyroidism, unspecified: Secondary | ICD-10-CM

## 2018-07-11 DIAGNOSIS — I10 Essential (primary) hypertension: Secondary | ICD-10-CM | POA: Diagnosis not present

## 2018-07-11 DIAGNOSIS — F329 Major depressive disorder, single episode, unspecified: Secondary | ICD-10-CM

## 2018-07-11 MED ORDER — ALBUTEROL SULFATE HFA 108 (90 BASE) MCG/ACT IN AERS: 2.0000 | INHALATION_SPRAY | Freq: Four times a day (QID) | RESPIRATORY_TRACT | 5 refills | Status: DC | PRN

## 2018-07-11 MED ORDER — MUPIROCIN 2 % EX OINT: TOPICAL_OINTMENT | CUTANEOUS | 0 refills | Status: DC

## 2018-07-11 MED ORDER — BUSPIRONE HCL 30 MG PO TABS: 30.0000 mg | ORAL_TABLET | Freq: Three times a day (TID) | ORAL | 3 refills | Status: DC | PRN

## 2018-07-11 MED ORDER — LEVOTHYROXINE SODIUM 75 MCG PO TABS: 75.0000 ug | ORAL_TABLET | Freq: Every day | ORAL | 3 refills | Status: DC

## 2018-07-11 MED ORDER — AMLODIPINE BESYLATE 5 MG PO TABS: 5.0000 mg | ORAL_TABLET | Freq: Every day | ORAL | 5 refills | Status: DC

## 2018-07-11 MED ORDER — CARVEDILOL 6.25 MG PO TABS: 6.2500 mg | ORAL_TABLET | Freq: Two times a day (BID) | ORAL | 0 refills | Status: DC

## 2018-07-11 MED ORDER — DULOXETINE HCL 60 MG PO CPEP: 60.0000 mg | ORAL_CAPSULE | Freq: Two times a day (BID) | ORAL | 2 refills | Status: DC

## 2018-07-11 MED ORDER — SULFAMETHOXAZOLE-TRIMETHOPRIM 800-160 MG PO TABS: 1.0000 | ORAL_TABLET | Freq: Two times a day (BID) | ORAL | 0 refills | Status: DC

## 2018-07-11 MED ORDER — ATORVASTATIN CALCIUM 40 MG PO TABS: 40.0000 mg | ORAL_TABLET | Freq: Every day | ORAL | 5 refills | Status: DC

## 2018-07-11 NOTE — Progress Notes (Signed)
[facility] [place], [place] [address]  Internal MEDICINE  Office Visit Note  Patient Name: Charles [name]  [id number]  [id number]  Date of Service: 07/23/2018   Pt is here for a sick visit.  Chief Complaint  Patient presents with  . Sore    bottom of right foot, has been this way for about 68months  . Cough    dry cough for about a month, no fever or chills, no body aches, pt havent been around anybody sick, ,no diarrhea or vomitting   . Nasal Congestion  . Medical Management of Chronic Issues    medication refills, pt has not taken medication lately due to insurance     The patient has moderate sized open lesion on the bottom of his right foot. It is red and tender. It is draining fluid. Has been present for past several days. Unsure if it is getting better or worse. Feels much worse after he has been on his feet for long periods of time. Getting hard for him to wear regular shoes, as this worsens his pain.  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 Has been out of several medications for some time. Does need to have refills. Will schedule regular follow up in near future.        Current Medication:  Outpatient Encounter Medications as of 07/11/2018  Medication Sig Note  . acetaminophen (TYLENOL) 325 MG tablet Take 2 tablets (650 mg total) by mouth every 6 (six) hours as needed for mild pain (or Fever >/= 101).   Marland Kitchen acidophilus (RISAQUAD) CAPS capsule Take 1 capsule by mouth daily.   Marland Kitchen albuterol (PROVENTIL HFA;VENTOLIN HFA) 108 (90 Base) MCG/ACT inhaler Inhale 2 puffs into the lungs every 6 (six) hours as needed for wheezing or shortness of breath.   Marland Kitchen amLODipine (NORVASC) 5 MG tablet Take 1 tablet (5 mg total) by mouth daily.   Marland Kitchen aspirin EC 81 MG tablet Take 1 tablet (81 mg total) by mouth daily.   Marland Kitchen atorvastatin (LIPITOR) 40 MG tablet Take 1 tablet (40 mg total) by mouth daily at 6 PM.   . busPIRone (BUSPAR) 30 MG tablet Take  1 tablet (30 mg total) by mouth 3 (three) times daily as needed.   . carvedilol (COREG) 6.25 MG tablet Take 1 tablet (6.25 mg total) by mouth 2 (two) times daily with a meal.   . Cyanocobalamin (B-12 PO) Take 1 tablet by mouth daily.   . DULoxetine (CYMBALTA) 60 MG capsule Take 1 capsule (60 mg total) by mouth 2 (two) times daily.   Marland Kitchen levothyroxine (SYNTHROID, LEVOTHROID) 75 MCG tablet Take 1 tablet (75 mcg total) by mouth daily before breakfast.   . lidocaine (LIDODERM) 5 % Place 1 patch onto the skin daily. Remove & Discard patch within 12 hours or as directed by MD   . morphine (MS CONTIN) 15 MG 12 hr tablet Take 15-30 mg by mouth every 12 (twelve) hours. Pt takes two tablets in the morning and one at night.   . Multiple Vitamin (MULTIVITAMIN WITH MINERALS) TABS tablet Take 1 tablet by mouth daily.   . mupirocin ointment (BACTROBAN) 2 % Apply to affected area bid for 7 days   . naloxegol oxalate (MOVANTIK) 25 MG TABS tablet Take 1 tablet (25 mg total) by mouth daily.   . ondansetron (ZOFRAN) 8 MG tablet TAKE ONE TABLET THREE TIMES A DAY AS NEEDED FOR NAUSEA   . Oxycodone HCl 10  MG TABS Take 10 mg by mouth 6 (six) times daily. 08/14/2015: 6 times daily  . pregabalin (LYRICA) 75 MG capsule Take 1 capsule (75 mg total) by mouth 3 (three) times daily.   . sildenafil (REVATIO) 20 MG tablet Take 20 mg by mouth daily as needed.    . tamsulosin (FLOMAX) 0.4 MG CAPS capsule Take 1 capsule (0.4 mg total) by mouth daily.   Marland Kitchen testosterone cypionate (DEPOTESTOSTERONE CYPIONATE) 200 MG/ML injection Inject 0.7 mLs (140 mg total) into the muscle once a week.   . [DISCONTINUED] acidophilus (RISAQUAD) CAPS capsule Take 1 capsule by mouth daily.   . [DISCONTINUED] albuterol (PROVENTIL HFA;VENTOLIN HFA) 108 (90 Base) MCG/ACT inhaler Inhale 2 puffs into the lungs every 6 (six) hours as needed for wheezing or shortness of breath.   . [DISCONTINUED] amLODipine (NORVASC) 5 MG tablet Take 1 tablet (5 mg total) by mouth  daily.   . [DISCONTINUED] atorvastatin (LIPITOR) 40 MG tablet Take 1 tablet (40 mg total) by mouth daily at 6 PM.   . [DISCONTINUED] busPIRone (BUSPAR) 30 MG tablet TAKE ONE TABLET THREE TIMES A DAY AS NEEDED FOR ANXIETY   . [DISCONTINUED] carvedilol (COREG) 6.25 MG tablet Take 1 tablet (6.25 mg total) by mouth 2 (two) times daily with a meal.   . [DISCONTINUED] DULoxetine (CYMBALTA) 60 MG capsule TAKE 1 CAPSULE BY MOUTH TWICE DAILY AS DIRECTED   . [DISCONTINUED] Levomilnacipran HCl ER 40 MG CP24 Take 40 mg by mouth daily.   . [DISCONTINUED] levothyroxine (SYNTHROID, LEVOTHROID) 75 MCG tablet Take 1 tablet (75 mcg total) by mouth daily before breakfast.   . [DISCONTINUED] mupirocin ointment (BACTROBAN) 2 % Place 1 application into the nose 2 (two) times daily. Total 3 days   . [DISCONTINUED] zolpidem (AMBIEN) 10 MG tablet Take 1 tablet (10 mg total) by mouth at bedtime.   . Diphenhyd-Hydrocort-Nystatin (FIRST-DUKES MOUTHWASH) SUSP Swish and swallow with 42mls QID prn sore throat (Patient not taking: Reported on 08/20/2017)   . docusate sodium (COLACE) 100 MG capsule Take 1 capsule (100 mg total) by mouth 2 (two) times daily. (Patient not taking: Reported on 09/07/2017)   . polyethylene glycol (MIRALAX / GLYCOLAX) packet Take 17 g daily as needed by mouth for mild constipation. (Patient not taking: Reported on 08/20/2017)   . sulfamethoxazole-trimethoprim (BACTRIM DS,SEPTRA DS) 800-160 MG tablet Take 1 tablet by mouth 2 (two) times daily.    No facility-administered encounter medications on file as of 07/11/2018.       Medical History: Past Medical History:  Diagnosis Date  . Anginal pain ([place])   . Anxiety   . Arthritis   . Chronic back pain   . Coronary artery disease   . Depression   . GERD (gastroesophageal reflux disease)   . Hypertension   . Hypothyroidism   . Insomnia   . Low testosterone   . Myocardial infarction ([place])   . Peptic ulcer   . Shortness of breath   . Sleep apnea     has cpap-has not used since lost 140lb  . Spinal cord stimulator status    has a scs  . Wears glasses      Today's Vitals   07/11/18 1620  BP: (!) 150/94  Pulse: 67  Resp: 16  Temp: 98 F (36.7 C)  SpO2: 98%  Weight: 268 lb 6.4 oz (121.7 kg)  Height: 6\' 2"  (1.88 m)   Body mass index is 34.46 kg/m.  Review of Systems  Constitutional: Positive for activity change  and fatigue. Negative for chills and unexpected weight change.  HENT: Positive for congestion, postnasal drip and rhinorrhea. Negative for sneezing and sore throat.   Respiratory: Negative for cough, chest tightness and shortness of breath.   Cardiovascular: Negative for chest pain and palpitations.  Gastrointestinal: Negative for abdominal pain, constipation, diarrhea, nausea and vomiting.  Genitourinary: Negative for dysuria and frequency.  Musculoskeletal: Negative for arthralgias, back pain, joint swelling and neck pain.  Skin: Positive for wound. Negative for rash.       Bottom aspect of right foot.   Neurological: Positive for headaches. Negative for tremors and numbness.  Hematological: Negative for adenopathy. Does not bruise/bleed easily.  Psychiatric/Behavioral: Positive for agitation and dysphoric mood. Negative for behavioral problems (Depression), sleep disturbance and suicidal ideas. The patient is nervous/anxious.     Physical Exam Vitals signs and nursing note reviewed.  Constitutional:      General: He is not in acute distress.    Appearance: He is well-developed. He is ill-appearing. He is not diaphoretic.  HENT:     Head: Normocephalic and atraumatic.     Mouth/Throat:     Pharynx: No oropharyngeal exudate.  Eyes:     Pupils: Pupils are equal, round, and reactive to light.  Neck:     Musculoskeletal: Normal range of motion and neck supple.     Thyroid: No thyromegaly.     Vascular: No JVD.     Trachea: No tracheal deviation.  Cardiovascular:     Rate and Rhythm: Normal rate and regular  rhythm.     Heart sounds: Normal heart sounds. No murmur. No friction rub. No gallop.   Pulmonary:     Effort: Pulmonary effort is normal. No respiratory distress.     Breath sounds: Normal breath sounds. No wheezing or rales.  Chest:     Chest wall: No tenderness.  Abdominal:     General: Bowel sounds are normal.     Palpations: Abdomen is soft.     Tenderness: There is no abdominal tenderness.  Musculoskeletal: Normal range of motion.  Lymphadenopathy:     Cervical: Cervical adenopathy present.  Skin:    General: Skin is warm and dry.     Findings: Lesion present.     Comments: Open lesion present on plantar aspect of right foot. Wound is erythematous and draining small amount of serosanguinous fluid.   Neurological:     Mental Status: He is alert and oriented to person, place, and time.     Cranial Nerves: No cranial nerve deficit.  Psychiatric:        Attention and Perception: Attention and perception normal.        Mood and Affect: Mood is anxious and depressed.        Speech: Speech normal.        Behavior: Behavior normal.        Thought Content: Thought content normal.        Cognition and Memory: Cognition and memory normal.        Judgment: Judgment normal.    Assessment/Plan: 1. Open wound of plantar aspect of foot, right, initial encounter Start Bactrim DS bid for 14 days. Recommend soaks in warm water and epsom salt twice daily. Keep dry. Start bactroban ointment twice daily for next 7 to 10 days.  - sulfamethoxazole-trimethoprim (BACTRIM DS,SEPTRA DS) 800-160 MG tablet; Take 1 tablet by mouth 2 (two) times daily.  Dispense: 28 tablet; Refill: 0 - mupirocin ointment (BACTROBAN) 2 %; Apply to affected  area bid for 7 days  Dispense: 22 g; Refill: 0  2. Mild intermittent asthma without complication Use rescue inhaler as needed and as prescribed  - albuterol (PROVENTIL HFA;VENTOLIN HFA) 108 (90 Base) MCG/ACT inhaler; Inhale 2 puffs into the lungs every 6 (six) hours as  needed for wheezing or shortness of breath.  Dispense: 1 Inhaler; Refill: 5  3. Acquired hypothyroidism Continue levothyroxine as prescribed. Check thyroid panel at next visit and adjust dosing as indicated.  - levothyroxine (SYNTHROID, LEVOTHROID) 75 MCG tablet; Take 1 tablet (75 mcg total) by mouth daily before breakfast.  Dispense: 30 tablet; Refill: 3  4. Essential hypertension Restart blood pressure medications. Refills sent to his pharmacy.  - amLODipine (NORVASC) 5 MG tablet; Take 1 tablet (5 mg total) by mouth daily.  Dispense: 30 tablet; Refill: 5 - carvedilol (COREG) 6.25 MG tablet; Take 1 tablet (6.25 mg total) by mouth 2 (two) times daily with a meal.  Dispense: 60 tablet; Refill: 0  5. Mixed hyperlipidemia - atorvastatin (LIPITOR) 40 MG tablet; Take 1 tablet (40 mg total) by mouth daily at 6 PM.  Dispense: 30 tablet; Refill: 5  6. Generalized anxiety disorder Restart duloxetine 60mg  twice daily and may take buspirone 30mg  up to three times daily as needed for acute anxiety.  - busPIRone (BUSPAR) 30 MG tablet; Take 1 tablet (30 mg total) by mouth 3 (three) times daily as needed.  Dispense: 90 tablet; Refill: 3 - DULoxetine (CYMBALTA) 60 MG capsule; Take 1 capsule (60 mg total) by mouth 2 (two) times daily.  Dispense: 60 capsule; Refill: 2  7. Major depression, chronic Restart duloxetine 60mg  twice daily - DULoxetine (CYMBALTA) 60 MG capsule; Take 1 capsule (60 mg total) by mouth 2 (two) times daily.  Dispense: 60 capsule; Refill: 2  General Counseling: Per verbalizes understanding of the findings of todays visit and agrees with plan of treatment. I have discussed any further diagnostic evaluation that may be needed or ordered today. We also reviewed his medications today. he has been encouraged to call the office with any questions or concerns that should arise related to todays visit.    Counseling:  Hypertension Counseling:   The following hypertensive lifestyle  modification were recommended and discussed:  1. Limiting alcohol intake to less than 1 oz/day of ethanol:(24 oz of beer or 8 oz of wine or 2 oz of 100-proof whiskey). 2. Take baby ASA 81 mg daily. 3. Importance of regular aerobic exercise and losing weight. 4. Reduce dietary saturated fat and cholesterol intake for overall cardiovascular health. 5. Maintaining adequate dietary potassium, calcium, and magnesium intake. 6. Regular monitoring of the blood pressure. 7. Reduce sodium intake to less than 100 mmol/day (less than 2.3 gm of sodium or less than 6 gm of sodium choride)   This patient was seen by [place] with Dr Lavera Guise as a part of collaborative care agreement  Meds ordered this encounter  Medications  . sulfamethoxazole-trimethoprim (BACTRIM DS,SEPTRA DS) 800-160 MG tablet    Sig: Take 1 tablet by mouth 2 (two) times daily.    Dispense:  28 tablet    Refill:  0    Order Specific Question:   Supervising Provider    Answer:   Lavera Guise [[id number]]  . mupirocin ointment (BACTROBAN) 2 %    Sig: Apply to affected area bid for 7 days    Dispense:  22 g    Refill:  0    Order Specific Question:  Supervising Provider    Answer:   Lavera Guise [[id number]]  . albuterol (PROVENTIL HFA;VENTOLIN HFA) 108 (90 Base) MCG/ACT inhaler    Sig: Inhale 2 puffs into the lungs every 6 (six) hours as needed for wheezing or shortness of breath.    Dispense:  1 Inhaler    Refill:  5    Order Specific Question:   Supervising Provider    Answer:   Lavera Guise [[id number]]  . amLODipine (NORVASC) 5 MG tablet    Sig: Take 1 tablet (5 mg total) by mouth daily.    Dispense:  30 tablet    Refill:  5    Order Specific Question:   Supervising Provider    Answer:   Lavera Guise [[id number]]  . atorvastatin (LIPITOR) 40 MG tablet    Sig: Take 1 tablet (40 mg total) by mouth daily at 6 PM.    Dispense:  30 tablet    Refill:  5    Order Specific Question:   Supervising Provider    Answer:    Lavera Guise [place]  . busPIRone (BUSPAR) 30 MG tablet    Sig: Take 1 tablet (30 mg total) by mouth 3 (three) times daily as needed.    Dispense:  90 tablet    Refill:  3    Order Specific Question:   Supervising Provider    Answer:   Lavera Guise [[id number]]  . carvedilol (COREG) 6.25 MG tablet    Sig: Take 1 tablet (6.25 mg total) by mouth 2 (two) times daily with a meal.    Dispense:  60 tablet    Refill:  0    Order Specific Question:   Supervising Provider    Answer:   Lavera Guise [place]  . levothyroxine (SYNTHROID, LEVOTHROID) 75 MCG tablet    Sig: Take 1 tablet (75 mcg total) by mouth daily before breakfast.    Dispense:  30 tablet    Refill:  3    Order Specific Question:   Supervising Provider    Answer:   Lavera Guise [place]  . DULoxetine (CYMBALTA) 60 MG capsule    Sig: Take 1 capsule (60 mg total) by mouth 2 (two) times daily.    Dispense:  60 capsule    Refill:  2    FOR FUTURE REFILLS    Order Specific Question:   Supervising Provider    Answer:   Lavera Guise [[id number]]    Time spent: 25 Minutes

## 2018-07-11 NOTE — Progress Notes (Signed)
Pt blood pressure elevated, pt mentioned that he has not had his medication in some months, informed provider,

## 2018-07-12 ENCOUNTER — Ambulatory Visit: Payer: Self-pay | Admitting: Adult Health

## 2018-07-13 ENCOUNTER — Encounter: Payer: Self-pay | Admitting: Nurse Practitioner

## 2018-07-13 DIAGNOSIS — F5101 Primary insomnia: Secondary | ICD-10-CM

## 2018-07-13 MED ORDER — ZOLPIDEM TARTRATE 10 MG PO TABS: 10.0000 mg | ORAL_TABLET | Freq: Every day | ORAL | 2 refills | Status: DC

## 2018-07-21 ENCOUNTER — Ambulatory Visit: Payer: BLUE CROSS/BLUE SHIELD | Admitting: Nurse Practitioner

## 2018-07-23 DIAGNOSIS — S91309A Unspecified open wound, unspecified foot, initial encounter: Secondary | ICD-10-CM | POA: Insufficient documentation

## 2018-07-23 DIAGNOSIS — E782 Mixed hyperlipidemia: Secondary | ICD-10-CM | POA: Insufficient documentation

## 2018-07-27 ENCOUNTER — Telehealth: Payer: Self-pay

## 2018-07-27 NOTE — Telephone Encounter (Signed)
Patient advised that [facility] has called in his rx for ambien.tat

## 2018-08-01 ENCOUNTER — Ambulatory Visit: Payer: BLUE CROSS/BLUE SHIELD | Admitting: Nurse Practitioner

## 2018-08-04 ENCOUNTER — Ambulatory Visit: Payer: BLUE CROSS/BLUE SHIELD | Admitting: Nurse Practitioner

## 2018-08-12 ENCOUNTER — Other Ambulatory Visit: Payer: Self-pay | Admitting: Nurse Practitioner

## 2018-08-12 DIAGNOSIS — I10 Essential (primary) hypertension: Secondary | ICD-10-CM

## 2018-08-12 MED ORDER — CARVEDILOL 6.25 MG PO TABS: 6.2500 mg | ORAL_TABLET | Freq: Two times a day (BID) | ORAL | 0 refills | Status: DC

## 2018-08-17 ENCOUNTER — Other Ambulatory Visit: Payer: Self-pay | Admitting: Adult Health

## 2018-08-17 ENCOUNTER — Encounter: Payer: Self-pay | Admitting: Adult Health

## 2018-08-17 ENCOUNTER — Ambulatory Visit: Payer: Self-pay | Admitting: Adult Health

## 2018-08-17 ENCOUNTER — Other Ambulatory Visit: Payer: Self-pay

## 2018-08-17 VITALS — BP 132/80 | HR 80 | Resp 16 | Ht 73.0 in | Wt 275.0 lb

## 2018-08-17 DIAGNOSIS — M792 Neuralgia and neuritis, unspecified: Secondary | ICD-10-CM

## 2018-08-17 DIAGNOSIS — I10 Essential (primary) hypertension: Secondary | ICD-10-CM

## 2018-08-17 DIAGNOSIS — S91301A Unspecified open wound, right foot, initial encounter: Secondary | ICD-10-CM

## 2018-08-17 MED ORDER — ONDANSETRON HCL 8 MG PO TABS: ORAL_TABLET | ORAL | 2 refills | Status: DC

## 2018-08-17 MED ORDER — PREGABALIN 75 MG PO CAPS: 75.0000 mg | ORAL_CAPSULE | Freq: Three times a day (TID) | ORAL | 3 refills | Status: DC

## 2018-08-17 NOTE — Progress Notes (Signed)
[facility] [address], [place] [address]  Internal MEDICINE  Office Visit Note  Patient Name: Charles [name]  [id number]  [id number]  Date of Service: 08/17/2018  Chief Complaint  Patient presents with  . Foot Ulcer    right foot not any better     HPI  Pt is here for follow up on right plantar ulcer.  He reports it has been present for 5 months.  It is currently not infected.  No redness or warmth noted.  He has taken bactrim, and used Bactroban. The wound is currently surrounded by calloused tissues, with beefy red granulated tissue in the wound bed. Pt has not seen podiatry or wound care for this wound.      Current Medication: Outpatient Encounter Medications as of 08/17/2018  Medication Sig Note  . acetaminophen (TYLENOL) 325 MG tablet Take 2 tablets (650 mg total) by mouth every 6 (six) hours as needed for mild pain (or Fever >/= 101).   Marland Kitchen acidophilus (RISAQUAD) CAPS capsule Take 1 capsule by mouth daily.   Marland Kitchen albuterol (PROVENTIL HFA;VENTOLIN HFA) 108 (90 Base) MCG/ACT inhaler Inhale 2 puffs into the lungs every 6 (six) hours as needed for wheezing or shortness of breath.   Marland Kitchen amLODipine (NORVASC) 5 MG tablet Take 1 tablet (5 mg total) by mouth daily.   Marland Kitchen aspirin EC 81 MG tablet Take 1 tablet (81 mg total) by mouth daily.   Marland Kitchen atorvastatin (LIPITOR) 40 MG tablet Take 1 tablet (40 mg total) by mouth daily at 6 PM.   . busPIRone (BUSPAR) 30 MG tablet Take 1 tablet (30 mg total) by mouth 3 (three) times daily as needed.   . carvedilol (COREG) 6.25 MG tablet Take 1 tablet (6.25 mg total) by mouth 2 (two) times daily with a meal.   . Cyanocobalamin (B-12 PO) Take 1 tablet by mouth daily.   . DULoxetine (CYMBALTA) 60 MG capsule Take 1 capsule (60 mg total) by mouth 2 (two) times daily.   Marland Kitchen levothyroxine (SYNTHROID, LEVOTHROID) 75 MCG tablet Take 1 tablet (75 mcg total) by mouth daily before breakfast.   . lidocaine (LIDODERM) 5 % Place 1 patch onto the skin  daily. Remove & Discard patch within 12 hours or as directed by MD   . morphine (MS CONTIN) 15 MG 12 hr tablet Take 15-30 mg by mouth every 12 (twelve) hours. Pt takes two tablets in the morning and one at night.   . Multiple Vitamin (MULTIVITAMIN WITH MINERALS) TABS tablet Take 1 tablet by mouth daily.   . mupirocin ointment (BACTROBAN) 2 % Apply to affected area bid for 7 days   . naloxegol oxalate (MOVANTIK) 25 MG TABS tablet Take 1 tablet (25 mg total) by mouth daily.   . ondansetron (ZOFRAN) 8 MG tablet TAKE ONE TABLET THREE TIMES A DAY AS NEEDED FOR NAUSEA   . Oxycodone HCl 10 MG TABS Take 10 mg by mouth 6 (six) times daily. 08/14/2015: 6 times daily  . polyethylene glycol (MIRALAX / GLYCOLAX) packet Take 17 g daily as needed by mouth for mild constipation.   . pregabalin (LYRICA) 75 MG capsule Take 1 capsule (75 mg total) by mouth 3 (three) times daily.   . sildenafil (REVATIO) 20 MG tablet Take 20 mg by mouth daily as needed.    . tamsulosin (FLOMAX) 0.4 MG CAPS capsule Take 1 capsule (0.4 mg total) by mouth daily.   Marland Kitchen testosterone cypionate (DEPOTESTOSTERONE CYPIONATE) 200 MG/ML injection Inject 0.7 mLs (  140 mg total) into the muscle once a week.   . zolpidem (AMBIEN) 10 MG tablet Take 1 tablet (10 mg total) by mouth at bedtime.   . [DISCONTINUED] pregabalin (LYRICA) 75 MG capsule Take 1 capsule (75 mg total) by mouth 3 (three) times daily.   Marland Kitchen docusate sodium (COLACE) 100 MG capsule Take 1 capsule (100 mg total) by mouth 2 (two) times daily. (Patient not taking: Reported on 08/17/2018)   . [DISCONTINUED] Diphenhyd-Hydrocort-Nystatin (FIRST-DUKES MOUTHWASH) SUSP Swish and swallow with 91mls QID prn sore throat (Patient not taking: Reported on 08/20/2017)   . [DISCONTINUED] ondansetron (ZOFRAN) 8 MG tablet TAKE ONE TABLET THREE TIMES A DAY AS NEEDED FOR NAUSEA   . [DISCONTINUED] sulfamethoxazole-trimethoprim (BACTRIM DS,SEPTRA DS) 800-160 MG tablet Take 1 tablet by mouth 2 (two) times daily.  (Patient not taking: Reported on 08/17/2018)    No facility-administered encounter medications on file as of 08/17/2018.     Surgical History: Past Surgical History:  Procedure Laterality Date  . BACK SURGERY    . CARDIAC CATHETERIZATION N/A 06/27/2015   Procedure: Left Heart Cath and Coronary Angiography;  Surgeon: Dionisio David, MD;  Location: [facility];  Service: Cardiovascular;  Laterality: N/A;  . CARDIAC CATHETERIZATION N/A 06/27/2015   Procedure: Coronary Stent Intervention;  Surgeon: Yolonda Kida, MD;  Location: [facility];  Service: Cardiovascular;  Laterality: N/A;  . CLOSED REDUCTION NASAL FRACTURE  12/22/2011   Procedure: CLOSED REDUCTION NASAL FRACTURE;  Surgeon: Ascencion Dike, MD;  Location: [facility];  Service: ENT;  Laterality: N/A;  closed reduction of nasal fracture  . FACIAL FRACTURE SURGERY     face-upper jaw with dental implants  . GASTRIC BYPASS  2011   has lost 140lb  . IRRIGATION AND DEBRIDEMENT FOOT Right 02/21/2017   Procedure: IRRIGATION AND DEBRIDEMENT FOOT;  Surgeon: Sharlotte Alamo, DPM;  Location: [facility];  Service: Podiatry;  Laterality: Right;  . IRRIGATION AND DEBRIDEMENT FOOT N/A 08/22/2017   Procedure: IRRIGATION AND DEBRIDEMENT FOOT and hardware removal;  Surgeon: Samara Deist, DPM;  Location: [facility];  Service: Podiatry;  Laterality: N/A;  . LEFT HEART CATH AND CORONARY ANGIOGRAPHY N/A 06/25/2016   Procedure: Left Heart Cath and Coronary Angiography;  Surgeon: Corey Skains, MD;  Location: [facility];  Service: Cardiovascular;  Laterality: N/A;  . METATARSAL OSTEOTOMY Right 02/10/2017   Procedure: METATARSAL OSTEOTOMY-GREAT TOE AND 1ST METATARSAL;  Surgeon: Samara Deist, DPM;  Location: [facility];  Service: Podiatry;  Laterality: Right;  . ORIF TOE FRACTURE Right 02/17/2017   Procedure: Open reduction with internal fixation displaced osteotomy and fracture first metatarsal;  Surgeon: Samara Deist,  DPM;  Location: [facility];  Service: Podiatry;  Laterality: Right;  IVA / POPLITEAL  . REPAIR TENDONS FOOT  2002   rt foot  . SPINAL CORD STIMULATOR IMPLANT  6/13    Medical History: Past Medical History:  Diagnosis Date  . Anginal pain ([place])   . Anxiety   . Arthritis   . Chronic back pain   . Coronary artery disease   . Depression   . GERD (gastroesophageal reflux disease)   . Hypertension   . Hypothyroidism   . Insomnia   . Low testosterone   . Myocardial infarction ([place])   . Peptic ulcer   . Shortness of breath   . Sleep apnea    has cpap-has not used since lost 140lb  . Spinal cord stimulator status  has a scs  . Wears glasses     Family History: Family History  Problem Relation Age of Onset  . Diabetes Father     Social History   Socioeconomic History  . Marital status: Married    Spouse name: Not on file  . Number of children: Not on file  . Years of education: Not on file  . Highest education level: Not on file  Occupational History  . Not on file  Social Needs  . Financial resource strain: Not on file  . Food insecurity:    Worry: Not on file    Inability: Not on file  . Transportation needs:    Medical: Not on file    Non-medical: Not on file  Tobacco Use  . Smoking status: Never Smoker  . Smokeless tobacco: Never Used  Substance and Sexual Activity  . Alcohol use: No    Alcohol/week: 0.0 standard drinks    Comment: not in 4 yr  . Drug use: No  . Sexual activity: Never  Lifestyle  . Physical activity:    Days per week: Not on file    Minutes per session: Not on file  . Stress: Not on file  Relationships  . Social connections:    Talks on phone: Not on file    Gets together: Not on file    Attends religious service: Not on file    Active member of club or organization: Not on file    Attends meetings of clubs or organizations: Not on file    Relationship status: Not on file  . Intimate partner violence:    Fear of  current or ex partner: Not on file    Emotionally abused: Not on file    Physically abused: Not on file    Forced sexual activity: Not on file  Other Topics Concern  . Not on file  Social History Narrative  . Not on file      Review of Systems  Constitutional: Negative.  Negative for chills, fatigue and unexpected weight change.  HENT: Negative.  Negative for congestion, rhinorrhea, sneezing and sore throat.   Eyes: Negative for redness.  Respiratory: Negative.  Negative for cough, chest tightness and shortness of breath.   Cardiovascular: Negative.  Negative for chest pain and palpitations.  Gastrointestinal: Negative.  Negative for abdominal pain, constipation, diarrhea, nausea and vomiting.  Endocrine: Negative.   Genitourinary: Negative.  Negative for dysuria and frequency.  Musculoskeletal: Negative.  Negative for arthralgias, back pain, joint swelling and neck pain.  Skin: Negative.  Negative for rash.  Allergic/Immunologic: Negative.   Neurological: Negative.  Negative for tremors and numbness.  Hematological: Negative for adenopathy. Does not bruise/bleed easily.  Psychiatric/Behavioral: Negative.  Negative for behavioral problems, sleep disturbance and suicidal ideas. The patient is not nervous/anxious.     Vital Signs: BP 132/80   Pulse 80   Resp 16   Ht 6\' 1"  (1.854 m)   Wt 275 lb (124.7 kg)   SpO2 97%   BMI 36.28 kg/m    Physical Exam Vitals signs and nursing note reviewed.  Constitutional:      General: He is not in acute distress.    Appearance: He is well-developed. He is not diaphoretic.  HENT:     Head: Normocephalic and atraumatic.     Mouth/Throat:     Pharynx: No oropharyngeal exudate.  Eyes:     Pupils: Pupils are equal, round, and reactive to light.  Neck:  Musculoskeletal: Normal range of motion and neck supple.     Thyroid: No thyromegaly.     Vascular: No JVD.     Trachea: No tracheal deviation.  Cardiovascular:     Rate and Rhythm:  Normal rate and regular rhythm.     Heart sounds: Normal heart sounds. No murmur. No friction rub. No gallop.   Pulmonary:     Effort: Pulmonary effort is normal. No respiratory distress.     Breath sounds: Normal breath sounds. No wheezing or rales.  Chest:     Chest wall: No tenderness.  Abdominal:     Palpations: Abdomen is soft.     Tenderness: There is no abdominal tenderness. There is no guarding.  Musculoskeletal: Normal range of motion.  Lymphadenopathy:     Cervical: No cervical adenopathy.  Skin:    General: Skin is warm and dry.     Comments: 3x4cm open wound to left plantar region of foot.   Neurological:     Mental Status: He is alert and oriented to person, place, and time.     Cranial Nerves: No cranial nerve deficit.  Psychiatric:        Behavior: Behavior normal.        Thought Content: Thought content normal.        Judgment: Judgment normal.    Assessment/Plan: 1. Open wound of plantar aspect of foot, right, initial encounter Pt is going to see podiatry, he sees Dr. Vickki Muff.  He will call and make the appt while in exam room.  He can be seen tomorrow at 1045am.   PT likely needs wound debridement, and a weight dispersing shoe.   2. Essential hypertension Stable, continue current medications.   General Counseling: Charles [name] understanding of the findings of todays visit and agrees with plan of treatment. I have discussed any further diagnostic evaluation that may be needed or ordered today. We also reviewed his medications today. he has been encouraged to call the office with any questions or concerns that should arise related to todays visit.    No orders of the defined types were placed in this encounter.   No orders of the defined types were placed in this encounter.   Time spent: 25 Minutes   This patient was seen by Orson Gear AGNP-C in Collaboration with Dr Lavera Guise as a part of collaborative care agreement     Kendell Bane  AGNP-C Internal medicine

## 2018-08-17 NOTE — Patient Instructions (Signed)
Diabetes Mellitus and Foot Care  Foot care is an important part of your health, especially when you have diabetes. Diabetes may cause you to have problems because of poor blood flow (circulation) to your feet and legs, which can cause your skin to:   Become thinner and drier.   Break more easily.   Heal more slowly.   Peel and crack.  You may also have nerve damage (neuropathy) in your legs and feet, causing decreased feeling in them. This means that you may not notice minor injuries to your feet that could lead to more serious problems. Noticing and addressing any potential problems early is the best way to prevent future foot problems.  How to care for your feet  Foot hygiene   Wash your feet daily with warm water and mild soap. Do not use hot water. Then, pat your feet and the areas between your toes until they are completely dry. Do not soak your feet as this can dry your skin.   Trim your toenails straight across. Do not dig under them or around the cuticle. File the edges of your nails with an emery board or nail file.   Apply a moisturizing lotion or petroleum jelly to the skin on your feet and to dry, brittle toenails. Use lotion that does not contain alcohol and is unscented. Do not apply lotion between your toes.  Shoes and socks   Wear clean socks or stockings every day. Make sure they are not too tight. Do not wear knee-high stockings since they may decrease blood flow to your legs.   Wear shoes that fit properly and have enough cushioning. Always look in your shoes before you put them on to be sure there are no objects inside.   To break in new shoes, wear them for just a few hours a day. This prevents injuries on your feet.  Wounds, scrapes, corns, and calluses   Check your feet daily for blisters, cuts, bruises, sores, and redness. If you cannot see the bottom of your feet, use a mirror or ask someone for help.   Do not cut corns or calluses or try to remove them with medicine.   If you  find a minor scrape, cut, or break in the skin on your feet, keep it and the skin around it clean and dry. You may clean these areas with mild soap and water. Do not clean the area with peroxide, alcohol, or iodine.   If you have a wound, scrape, corn, or callus on your foot, look at it several times a day to make sure it is healing and not infected. Check for:  ? Redness, swelling, or pain.  ? Fluid or blood.  ? Warmth.  ? Pus or a bad smell.  General instructions   Do not cross your legs. This may decrease blood flow to your feet.   Do not use heating pads or hot water bottles on your feet. They may burn your skin. If you have lost feeling in your feet or legs, you may not know this is happening until it is too late.   Protect your feet from hot and cold by wearing shoes, such as at the beach or on hot pavement.   Schedule a complete foot exam at least once a year (annually) or more often if you have foot problems. If you have foot problems, report any cuts, sores, or bruises to your health care provider immediately.  Contact a health care provider if:     You have a medical condition that increases your risk of infection and you have any cuts, sores, or bruises on your feet.   You have an injury that is not healing.   You have redness on your legs or feet.   You feel burning or tingling in your legs or feet.   You have pain or cramps in your legs and feet.   Your legs or feet are numb.   Your feet always feel cold.   You have pain around a toenail.  Get help right away if:   You have a wound, scrape, corn, or callus on your foot and:  ? You have pain, swelling, or redness that gets worse.  ? You have fluid or blood coming from the wound, scrape, corn, or callus.  ? Your wound, scrape, corn, or callus feels warm to the touch.  ? You have pus or a bad smell coming from the wound, scrape, corn, or callus.  ? You have a fever.  ? You have a red line going up your leg.  Summary   Check your feet every day  for cuts, sores, red spots, swelling, and blisters.   Moisturize feet and legs daily.   Wear shoes that fit properly and have enough cushioning.   If you have foot problems, report any cuts, sores, or bruises to your health care provider immediately.   Schedule a complete foot exam at least once a year (annually) or more often if you have foot problems.  This information is not intended to replace advice given to you by your health care provider. Make sure you discuss any questions you have with your health care provider.  Document Released: 04/03/2000 Document Revised: 05/19/2017 Document Reviewed: 05/08/2016  Elsevier Interactive Patient Education  2019 [employer]

## 2018-08-18 ENCOUNTER — Other Ambulatory Visit: Payer: Self-pay | Admitting: Adult Health

## 2018-08-18 DIAGNOSIS — M19071 Primary osteoarthritis, right ankle and foot: Secondary | ICD-10-CM | POA: Diagnosis not present

## 2018-08-18 DIAGNOSIS — M792 Neuralgia and neuritis, unspecified: Secondary | ICD-10-CM

## 2018-08-18 DIAGNOSIS — L97512 Non-pressure chronic ulcer of other part of right foot with fat layer exposed: Secondary | ICD-10-CM | POA: Diagnosis not present

## 2018-08-18 DIAGNOSIS — M79671 Pain in right foot: Secondary | ICD-10-CM | POA: Diagnosis not present

## 2018-08-18 MED ORDER — PREGABALIN 75 MG PO CAPS: 75.0000 mg | ORAL_CAPSULE | Freq: Three times a day (TID) | ORAL | 3 refills | Status: DC

## 2018-08-23 DIAGNOSIS — E11621 Type 2 diabetes mellitus with foot ulcer: Secondary | ICD-10-CM | POA: Diagnosis not present

## 2018-09-05 ENCOUNTER — Other Ambulatory Visit: Payer: Self-pay

## 2018-09-05 ENCOUNTER — Other Ambulatory Visit: Payer: BLUE CROSS/BLUE SHIELD

## 2018-09-05 DIAGNOSIS — N401 Enlarged prostate with lower urinary tract symptoms: Secondary | ICD-10-CM

## 2018-09-05 DIAGNOSIS — N138 Other obstructive and reflux uropathy: Secondary | ICD-10-CM

## 2018-09-05 DIAGNOSIS — E291 Testicular hypofunction: Secondary | ICD-10-CM

## 2018-09-06 ENCOUNTER — Other Ambulatory Visit: Payer: Self-pay | Admitting: Internal Medicine

## 2018-09-06 DIAGNOSIS — F5101 Primary insomnia: Secondary | ICD-10-CM

## 2018-09-06 DIAGNOSIS — L97512 Non-pressure chronic ulcer of other part of right foot with fat layer exposed: Secondary | ICD-10-CM | POA: Diagnosis not present

## 2018-09-07 ENCOUNTER — Telehealth: Payer: BLUE CROSS/BLUE SHIELD | Admitting: Urology

## 2018-09-07 NOTE — Telephone Encounter (Signed)
LAST 4/20 AND NEXT 8/20

## 2018-09-13 ENCOUNTER — Other Ambulatory Visit: Payer: Self-pay

## 2018-09-15 ENCOUNTER — Other Ambulatory Visit: Payer: Self-pay

## 2018-09-15 ENCOUNTER — Telehealth: Payer: BLUE CROSS/BLUE SHIELD | Admitting: Urology

## 2018-09-15 ENCOUNTER — Telehealth: Payer: Self-pay | Admitting: Urology

## 2018-09-15 DIAGNOSIS — I10 Essential (primary) hypertension: Secondary | ICD-10-CM

## 2018-09-15 MED ORDER — CARVEDILOL 6.25 MG PO TABS: 6.2500 mg | ORAL_TABLET | Freq: Two times a day (BID) | ORAL | 2 refills | Status: DC

## 2018-09-15 NOTE — Telephone Encounter (Signed)
I called to r/s pt's virtual visit that he missed and he states that he does not want to do an appt right now (he states he doesn't have the money) He will call us at a later date and r/s

## 2018-09-15 NOTE — Progress Notes (Signed)
Schedule for follow-up visit via video however was unable to connect.  Will reschedule

## 2018-09-27 DIAGNOSIS — L97512 Non-pressure chronic ulcer of other part of right foot with fat layer exposed: Secondary | ICD-10-CM | POA: Diagnosis not present

## 2018-10-04 DIAGNOSIS — L97512 Non-pressure chronic ulcer of other part of right foot with fat layer exposed: Secondary | ICD-10-CM | POA: Diagnosis not present

## 2018-10-11 DIAGNOSIS — L97512 Non-pressure chronic ulcer of other part of right foot with fat layer exposed: Secondary | ICD-10-CM | POA: Diagnosis not present

## 2018-10-11 DIAGNOSIS — M79671 Pain in right foot: Secondary | ICD-10-CM | POA: Diagnosis not present

## 2018-10-11 DIAGNOSIS — M19071 Primary osteoarthritis, right ankle and foot: Secondary | ICD-10-CM | POA: Diagnosis not present

## 2018-10-18 DIAGNOSIS — L97512 Non-pressure chronic ulcer of other part of right foot with fat layer exposed: Secondary | ICD-10-CM | POA: Diagnosis not present

## 2018-10-25 ENCOUNTER — Other Ambulatory Visit: Payer: BC Managed Care – PPO

## 2018-10-25 ENCOUNTER — Other Ambulatory Visit: Payer: Self-pay | Admitting: Podiatry

## 2018-10-25 DIAGNOSIS — L97512 Non-pressure chronic ulcer of other part of right foot with fat layer exposed: Secondary | ICD-10-CM | POA: Diagnosis not present

## 2018-10-26 ENCOUNTER — Encounter
Admission: RE | Admit: 2018-10-26 | Discharge: 2018-10-26 | Disposition: A | Discharge status: Home / Self Care | Payer: BC Managed Care – PPO | Source: Ambulatory Visit | Attending: Podiatry | Admitting: Podiatry

## 2018-10-26 ENCOUNTER — Other Ambulatory Visit: Payer: Self-pay

## 2018-10-26 DIAGNOSIS — Z01818 Encounter for other preprocedural examination: Secondary | ICD-10-CM | POA: Diagnosis not present

## 2018-10-26 DIAGNOSIS — I2510 Atherosclerotic heart disease of native coronary artery without angina pectoris: Secondary | ICD-10-CM | POA: Insufficient documentation

## 2018-10-26 DIAGNOSIS — L97512 Non-pressure chronic ulcer of other part of right foot with fat layer exposed: Secondary | ICD-10-CM | POA: Diagnosis not present

## 2018-10-26 DIAGNOSIS — Z1159 Encounter for screening for other viral diseases: Secondary | ICD-10-CM | POA: Insufficient documentation

## 2018-10-26 DIAGNOSIS — Z01810 Encounter for preprocedural cardiovascular examination: Secondary | ICD-10-CM | POA: Diagnosis not present

## 2018-10-26 HISTORY — DX: Pneumonia, unspecified organism: J18.9

## 2018-10-26 HISTORY — DX: Unspecified asthma, uncomplicated: J45.909

## 2018-10-26 HISTORY — DX: Unspecified mononeuropathy of bilateral lower limbs: G57.93

## 2018-10-26 LAB — CBC
HCT: 38.4 % — ABNORMAL LOW (ref 39.0–52.0)
Hemoglobin: 12.4 g/dL — ABNORMAL LOW (ref 13.0–17.0)
MCH: 29.2 pg (ref 26.0–34.0)
MCHC: 32.3 g/dL (ref 30.0–36.0)
MCV: 90.6 fL (ref 80.0–100.0)
Platelets: 347 10*3/uL (ref 150–400)
RBC: 4.24 MIL/uL (ref 4.22–5.81)
RDW: 13.2 % (ref 11.5–15.5)
WBC: 8.8 10*3/uL (ref 4.0–10.5)
nRBC: 0 % (ref 0.0–0.2)

## 2018-10-26 LAB — BASIC METABOLIC PANEL
Anion gap: 8 (ref 5–15)
BUN: 20 mg/dL (ref 6–20)
CO2: 24 mmol/L (ref 22–32)
Calcium: 8.5 mg/dL — ABNORMAL LOW (ref 8.9–10.3)
Chloride: 105 mmol/L (ref 98–111)
Creatinine, Ser: 0.92 mg/dL (ref 0.61–1.24)
GFR calc Af Amer: >60 mL/min (ref >60)
GFR calc non Af Amer: >60 mL/min (ref >60)
Glucose, Bld: 107 mg/dL — ABNORMAL HIGH (ref 70–99)
Potassium: 4 mmol/L (ref 3.5–5.1)
Sodium: 137 mmol/L (ref 135–145)

## 2018-10-26 NOTE — Patient Instructions (Addendum)
Your procedure is scheduled on: Friday, October 28, 2018 Report to Day Surgery on the 2nd floor of the [facility]. To find out your arrival time, please call [phone] between 1PM - 3PM on: Thursday, July 9  REMEMBER: Instructions that are not followed completely may result in serious medical risk, up to and including death; or upon the discretion of your surgeon and anesthesiologist your surgery may need to be rescheduled.  Do not eat food after midnight the night before surgery.  No gum chewing, lozengers or hard candies.  You may however, drink CLEAR liquids up to 2 hours before you are scheduled to arrive for your surgery. Do not drink anything within 2 hours of the start of your surgery.  Clear liquids include: - water  - apple juice without pulp - gatorade - black coffee or tea (Do NOT add milk or creamers to the coffee or tea) Do NOT drink anything that is not on this list.  ENSURE PRE-SURGERY CARBOHYDRATE DRINK:  Complete drinking 3 hours before arrival to the hospital.  No Alcohol for 24 hours before or after surgery.  No Smoking including e-cigarettes for 24 hours prior to surgery.  No chewable tobacco products for at least 6 hours prior to surgery.  No nicotine patches on the day of surgery.  On the morning of surgery brush your teeth with toothpaste and water, you may rinse your mouth with mouthwash if you wish. Do not swallow any toothpaste or mouthwash.  Notify your doctor if there is any change in your medical condition (cold, fever, infection).  Do not wear jewelry, make-up, hairpins, clips or nail polish.  Do not wear lotions, powders, or perfumes.   Do not shave 48 hours prior to surgery.   Contacts and dentures may not be worn into surgery.  Do not bring valuables to the hospital, including drivers license, insurance or credit cards.  [facility] is not responsible for any belongings or valuables.   TAKE THESE MEDICATIONS THE MORNING OF SURGERY:  1.   Albuterol inhaler 2.  Amlodipine 3.  Carvedilol 4.  Duloxetine 5.  Levothyroxine 6.  Morphine 7.  Oxycodone 8.  Pregabalin 9.  Tamsulosin  Use CHG Soap or wipes as directed on instruction sheet.  Use inhalers on the day of surgery and bring to the hospital.  NOW!  Stop Anti-inflammatories (NSAIDS) such as Advil, Aleve, Ibuprofen, Motrin, Naproxen, Naprosyn and Aspirin based products such as Excedrin, Goodys Powder, BC Powder. (May take Tylenol or Acetaminophen if needed.)  NOW!  Stop ANY OVER THE COUNTER supplements until after surgery. (BLACK ELDERBERRY, CO-Q10) (May continue Vitamin D, Vitamin B, and multivitamin.)  Wear comfortable clothing (specific to your surgery type) to the hospital.  If you are being discharged the day of surgery, you will not be allowed to drive home. You will need a responsible adult to drive you home and stay with you that night.   If you are taking public transportation, you will need to have a responsible adult with you. Please confirm with your physician that it is acceptable to use public transportation.   Please call [phone] if you have any questions about these instructions.

## 2018-10-27 LAB — SARS CORONAVIRUS 2 (TAT 6-24 HRS): SARS Coronavirus 2: NEGATIVE

## 2018-10-27 MED ORDER — CEFAZOLIN SODIUM-DEXTROSE 2-4 GM/100ML-% IV SOLN
2.0000 g | INTRAVENOUS | Status: AC
  Administered 2018-10-28: 2 g via INTRAVENOUS

## 2018-10-28 ENCOUNTER — Ambulatory Visit: Payer: BC Managed Care – PPO | Admitting: Anesthesiology

## 2018-10-28 ENCOUNTER — Ambulatory Visit
Admission: RE | Admit: 2018-10-28 | Discharge: 2018-10-28 | Disposition: A | Discharge status: Home / Self Care | Payer: BC Managed Care – PPO | Source: Ambulatory Visit | Attending: Podiatry | Admitting: Podiatry

## 2018-10-28 ENCOUNTER — Encounter
Admission: RE | Disposition: A | Discharge status: Home / Self Care | Payer: Self-pay | Source: Ambulatory Visit | Attending: Podiatry

## 2018-10-28 ENCOUNTER — Other Ambulatory Visit: Payer: Self-pay

## 2018-10-28 ENCOUNTER — Encounter: Payer: Self-pay | Admitting: Emergency Medicine

## 2018-10-28 DIAGNOSIS — F419 Anxiety disorder, unspecified: Secondary | ICD-10-CM | POA: Insufficient documentation

## 2018-10-28 DIAGNOSIS — Z9884 Bariatric surgery status: Secondary | ICD-10-CM | POA: Diagnosis not present

## 2018-10-28 DIAGNOSIS — G4733 Obstructive sleep apnea (adult) (pediatric): Secondary | ICD-10-CM | POA: Diagnosis not present

## 2018-10-28 DIAGNOSIS — Z79891 Long term (current) use of opiate analgesic: Secondary | ICD-10-CM | POA: Insufficient documentation

## 2018-10-28 DIAGNOSIS — L97512 Non-pressure chronic ulcer of other part of right foot with fat layer exposed: Secondary | ICD-10-CM | POA: Insufficient documentation

## 2018-10-28 DIAGNOSIS — Z79890 Hormone replacement therapy (postmenopausal): Secondary | ICD-10-CM | POA: Insufficient documentation

## 2018-10-28 DIAGNOSIS — X58XXXA Exposure to other specified factors, initial encounter: Secondary | ICD-10-CM | POA: Diagnosis not present

## 2018-10-28 DIAGNOSIS — I10 Essential (primary) hypertension: Secondary | ICD-10-CM | POA: Diagnosis not present

## 2018-10-28 DIAGNOSIS — Z7982 Long term (current) use of aspirin: Secondary | ICD-10-CM | POA: Diagnosis not present

## 2018-10-28 DIAGNOSIS — I96 Gangrene, not elsewhere classified: Secondary | ICD-10-CM | POA: Diagnosis not present

## 2018-10-28 DIAGNOSIS — K219 Gastro-esophageal reflux disease without esophagitis: Secondary | ICD-10-CM | POA: Insufficient documentation

## 2018-10-28 DIAGNOSIS — I252 Old myocardial infarction: Secondary | ICD-10-CM | POA: Insufficient documentation

## 2018-10-28 DIAGNOSIS — F418 Other specified anxiety disorders: Secondary | ICD-10-CM | POA: Diagnosis not present

## 2018-10-28 DIAGNOSIS — E039 Hypothyroidism, unspecified: Secondary | ICD-10-CM | POA: Diagnosis not present

## 2018-10-28 DIAGNOSIS — S93104A Unspecified dislocation of right toe(s), initial encounter: Secondary | ICD-10-CM | POA: Insufficient documentation

## 2018-10-28 DIAGNOSIS — S93121A Dislocation of metatarsophalangeal joint of right great toe, initial encounter: Secondary | ICD-10-CM | POA: Diagnosis not present

## 2018-10-28 DIAGNOSIS — M86171 Other acute osteomyelitis, right ankle and foot: Secondary | ICD-10-CM | POA: Diagnosis not present

## 2018-10-28 DIAGNOSIS — Z79899 Other long term (current) drug therapy: Secondary | ICD-10-CM | POA: Insufficient documentation

## 2018-10-28 DIAGNOSIS — I2510 Atherosclerotic heart disease of native coronary artery without angina pectoris: Secondary | ICD-10-CM | POA: Insufficient documentation

## 2018-10-28 DIAGNOSIS — M869 Osteomyelitis, unspecified: Secondary | ICD-10-CM | POA: Diagnosis not present

## 2018-10-28 DIAGNOSIS — F329 Major depressive disorder, single episode, unspecified: Secondary | ICD-10-CM | POA: Diagnosis not present

## 2018-10-28 HISTORY — PX: AMPUTATION TOE: SHX6595

## 2018-10-28 HISTORY — PX: METATARSAL HEAD EXCISION: SHX5027

## 2018-10-28 SURGERY — AMPUTATION, TOE
Anesthesia: General | Laterality: Right

## 2018-10-28 MED ORDER — FAMOTIDINE 20 MG PO TABS
20.0000 mg | ORAL_TABLET | Freq: Once | ORAL | Status: AC
  Administered 2018-10-28: 20 mg via ORAL

## 2018-10-28 MED ORDER — POVIDONE-IODINE 7.5 % EX SOLN
Freq: Once | CUTANEOUS | Status: DC
  Filled 2018-10-28: qty 118

## 2018-10-28 MED ORDER — THROMBIN 5000 UNITS EX SOLR
CUTANEOUS | Status: DC | PRN
  Administered 2018-10-28: 5000 [IU] via TOPICAL

## 2018-10-28 MED ORDER — PROPOFOL 10 MG/ML IV BOLUS
INTRAVENOUS | Status: AC
  Filled 2018-10-28: qty 20

## 2018-10-28 MED ORDER — ONDANSETRON HCL 4 MG PO TABS: 4.0000 mg | ORAL_TABLET | Freq: Four times a day (QID) | ORAL | Status: DC | PRN

## 2018-10-28 MED ORDER — ONDANSETRON HCL 4 MG/2ML IJ SOLN
INTRAMUSCULAR | Status: DC | PRN
  Administered 2018-10-28: 4 mg via INTRAVENOUS

## 2018-10-28 MED ORDER — THROMBIN 5000 UNITS EX SOLR
CUTANEOUS | Status: AC
  Filled 2018-10-28: qty 5000

## 2018-10-28 MED ORDER — MIDAZOLAM HCL 2 MG/2ML IJ SOLN
INTRAMUSCULAR | Status: AC
  Filled 2018-10-28: qty 2

## 2018-10-28 MED ORDER — LACTATED RINGERS IV SOLN
INTRAVENOUS | Status: DC
  Administered 2018-10-28: 08:00:00 via INTRAVENOUS

## 2018-10-28 MED ORDER — ONDANSETRON HCL 4 MG/2ML IJ SOLN: 4.0000 mg | Freq: Four times a day (QID) | INTRAMUSCULAR | Status: DC | PRN

## 2018-10-28 MED ORDER — BUPIVACAINE-EPINEPHRINE (PF) 0.25% -1:200000 IJ SOLN
INTRAMUSCULAR | Status: AC
  Filled 2018-10-28: qty 30

## 2018-10-28 MED ORDER — FAMOTIDINE 20 MG PO TABS
ORAL_TABLET | ORAL | Status: AC
  Administered 2018-10-28: 20 mg via ORAL
  Filled 2018-10-28: qty 1

## 2018-10-28 MED ORDER — MIDAZOLAM HCL 2 MG/2ML IJ SOLN
INTRAMUSCULAR | Status: DC | PRN
  Administered 2018-10-28: 2 mg via INTRAVENOUS

## 2018-10-28 MED ORDER — LIDOCAINE HCL (PF) 2 % IJ SOLN
INTRAMUSCULAR | Status: AC
  Filled 2018-10-28: qty 10

## 2018-10-28 MED ORDER — BUPIVACAINE HCL 0.5 % IJ SOLN
INTRAMUSCULAR | Status: DC | PRN
  Administered 2018-10-28: 10 mL

## 2018-10-28 MED ORDER — OXYCODONE-ACETAMINOPHEN 5-325 MG PO TABS: 1.0000 | ORAL_TABLET | Freq: Four times a day (QID) | ORAL | 0 refills | Status: DC | PRN

## 2018-10-28 MED ORDER — CEFAZOLIN SODIUM-DEXTROSE 2-4 GM/100ML-% IV SOLN
INTRAVENOUS | Status: AC
  Filled 2018-10-28: qty 100

## 2018-10-28 MED ORDER — LIDOCAINE HCL (CARDIAC) PF 100 MG/5ML IV SOSY
PREFILLED_SYRINGE | INTRAVENOUS | Status: DC | PRN
  Administered 2018-10-28: 100 mg via INTRAVENOUS

## 2018-10-28 MED ORDER — PROPOFOL 10 MG/ML IV BOLUS
INTRAVENOUS | Status: DC | PRN
  Administered 2018-10-28: 200 mg via INTRAVENOUS

## 2018-10-28 MED ORDER — LIDOCAINE HCL (PF) 1 % IJ SOLN
INTRAMUSCULAR | Status: DC | PRN
  Administered 2018-10-28: 10 mL

## 2018-10-28 MED ORDER — PHENYLEPHRINE HCL (PRESSORS) 10 MG/ML IV SOLN
INTRAVENOUS | Status: DC | PRN
  Administered 2018-10-28: 100 ug via INTRAVENOUS

## 2018-10-28 MED ORDER — FENTANYL CITRATE (PF) 250 MCG/5ML IJ SOLN
INTRAMUSCULAR | Status: AC
  Filled 2018-10-28: qty 5

## 2018-10-28 SURGICAL SUPPLY — 65 items
BANDAGE ELASTIC 4 LF NS (GAUZE/BANDAGES/DRESSINGS) ×2 IMPLANT
BANDAGE ELASTIC 6 LF NS (GAUZE/BANDAGES/DRESSINGS) ×4 IMPLANT
BLADE MED AGGRESSIVE (BLADE) ×2 IMPLANT
BLADE OSC/SAGITTAL MD 5.5X18 (BLADE) ×2 IMPLANT
BLADE SURG 15 STRL LF DISP TIS (BLADE) ×2 IMPLANT
BLADE SURG 15 STRL SS (BLADE) ×2
BLADE SURG MINI STRL (BLADE) ×2 IMPLANT
BNDG CONFORM 2 STRL LF (GAUZE/BANDAGES/DRESSINGS) ×2 IMPLANT
BNDG CONFORM 3 STRL LF (GAUZE/BANDAGES/DRESSINGS) ×4 IMPLANT
BNDG ESMARK 4X12 TAN STRL LF (GAUZE/BANDAGES/DRESSINGS) ×2 IMPLANT
BNDG GAUZE 4.5X4.1 6PLY STRL (MISCELLANEOUS) ×2 IMPLANT
CANISTER SUCT 1200ML W/VALVE (MISCELLANEOUS) ×2 IMPLANT
COVER WAND RF STERILE (DRAPES) ×2 IMPLANT
CUFF TOURN SGL QUICK 12 (TOURNIQUET CUFF) IMPLANT
CUFF TOURN SGL QUICK 18X4 (TOURNIQUET CUFF) IMPLANT
DRAPE FLUOR MINI C-ARM 54X84 (DRAPES) ×2 IMPLANT
DRAPE XRAY CASSETTE 23X24 (DRAPES) IMPLANT
DURAPREP 26ML APPLICATOR (WOUND CARE) ×2 IMPLANT
ELECT REM PT RETURN 9FT ADLT (ELECTROSURGICAL) ×2
ELECTRODE REM PT RTRN 9FT ADLT (ELECTROSURGICAL) ×1 IMPLANT
GAUZE 4X4 16PLY RFD (DISPOSABLE) ×2 IMPLANT
GAUZE PACKING IODOFORM 1/2 (PACKING) ×2 IMPLANT
GAUZE SPONGE 4X4 12PLY STRL (GAUZE/BANDAGES/DRESSINGS) ×2 IMPLANT
GAUZE XEROFORM 1X8 LF (GAUZE/BANDAGES/DRESSINGS) ×2 IMPLANT
GLOVE BIO SURGEON STRL SZ7.5 (GLOVE) ×2 IMPLANT
GLOVE INDICATOR 8.0 STRL GRN (GLOVE) ×2 IMPLANT
GOWN STRL REUS W/ TWL LRG LVL3 (GOWN DISPOSABLE) ×2 IMPLANT
GOWN STRL REUS W/TWL LRG LVL3 (GOWN DISPOSABLE) ×2
HEMOSTAT SURGICEL 2X3 (HEMOSTASIS) ×6 IMPLANT
KIT TURNOVER KIT A (KITS) ×2 IMPLANT
LABEL OR SOLS (LABEL) ×2 IMPLANT
NEEDLE FILTER BLUNT 18X 1/2SAF (NEEDLE)
NEEDLE FILTER BLUNT 18X1 1/2 (NEEDLE) IMPLANT
NEEDLE HYPO 22GX1.5 SAFETY (NEEDLE) ×2 IMPLANT
NEEDLE HYPO 25X1 1.5 SAFETY (NEEDLE) ×2 IMPLANT
NS IRRIG 500ML POUR BTL (IV SOLUTION) ×2 IMPLANT
PACK EXTREMITY ARMC (MISCELLANEOUS) ×2 IMPLANT
PAD ABD DERMACEA PRESS 5X9 (GAUZE/BANDAGES/DRESSINGS) ×4 IMPLANT
PAD CAST CTTN 4X4 STRL (SOFTGOODS) ×1 IMPLANT
PAD PREP 24X41 OB/GYN DISP (PERSONAL CARE ITEMS) ×2 IMPLANT
PADDING CAST COTTON 4X4 STRL (SOFTGOODS) ×1
PENCIL ELECTRO HAND CTR (MISCELLANEOUS) ×2 IMPLANT
PIN BALLS 3/8 F/.054-.062 WIRE (MISCELLANEOUS) IMPLANT
PULSAVAC PLUS IRRIG FAN TIP (DISPOSABLE)
RASP SM TEAR CROSS CUT (RASP) ×2 IMPLANT
SHIELD FULL FACE ANTIFOG 7M (MISCELLANEOUS) ×2 IMPLANT
SOL .9 NS 3000ML IRR  AL (IV SOLUTION)
SOL .9 NS 3000ML IRR UROMATIC (IV SOLUTION) IMPLANT
SPOGE SURGIFLO 8M (HEMOSTASIS) ×1
SPONGE SURGIFLO 8M (HEMOSTASIS) ×1 IMPLANT
STOCKINETTE M/LG 89821 (MISCELLANEOUS) ×2 IMPLANT
STOCKINETTE STRL 6IN 960660 (GAUZE/BANDAGES/DRESSINGS) ×2 IMPLANT
STRAP SAFETY 5IN WIDE (MISCELLANEOUS) ×2 IMPLANT
STRIP CLOSURE SKIN 1/4X4 (GAUZE/BANDAGES/DRESSINGS) ×2 IMPLANT
SUT ETHILON 3-0 FS-10 30 BLK (SUTURE) ×2
SUT ETHILON 5-0 FS-2 18 BLK (SUTURE) ×2 IMPLANT
SUT VIC AB 4-0 FS2 27 (SUTURE) ×2 IMPLANT
SUT VIC AB 4-0 RB1 27 (SUTURE) ×1
SUT VIC AB 4-0 RB1 27X BRD (SUTURE) ×1 IMPLANT
SUTURE EHLN 3-0 FS-10 30 BLK (SUTURE) ×1 IMPLANT
SYR 10ML LL (SYRINGE) ×6 IMPLANT
SYR 50ML LL SCALE MARK (SYRINGE) ×2 IMPLANT
TIP FAN IRRIG PULSAVAC PLUS (DISPOSABLE) IMPLANT
WIRE Z .045 C-WIRE SPADE TIP (WIRE) IMPLANT
WIRE Z .062 C-WIRE SPADE TIP (WIRE) IMPLANT

## 2018-10-28 NOTE — Op Note (Signed)
Operative note   Surgeon:Camay Pedigo Lawyer: None    Preop diagnosis: 1.  Dislocated right great toe 2.  Osteomyelitis right second metatarsophalangeal joint    Postop diagnosis: Same    Procedure: 1.  Amputation right great toe 2.  Excision distal second metatarsal right foot    EBL: 30 cc    Anesthesia:local and general    Hemostasis: Ankle tourniquet inflated to 200 mmHg for approximately 30 minutes    Specimen: 1 great toe amputation 2-second metatarsal osteomyelitis 3 bone for culture sec metatarsal    Complications: None    Operative indications:Charles [name] is an 58 y.o. that presents today for surgical intervention.  The risks/benefits/alternatives/complications have been discussed and consent has been given.    Procedure:  Patient was brought into the OR and placed on the operating table in thesupine position. After anesthesia was obtained theright lower extremity was prepped and draped in usual sterile fashion.  Attention was directed to the first MTPJ where a fishmouth incision was made circumferential around the great toe.  Full-thickness dissection was carried down to the metatarsophalangeal joint.  The toe was then disarticulated sharply.  This was sent for pathological examination.  Through the same incision site I was able to access the second metatarsal.  This was dissected soft tissue dorsal and plantarly.  With a power saw an osteotomy was created through and through just proximal to the surgical neck.  Bone was removed from the area.  The cartilaginous cap was noted to be loose and free-floating within the joint space.  This was then excised.  The cartilage and bone That was removed was sent for fresh culture.  The osteotomized bone was sent for pathological examination with the proximal margin inked.  All wounds were flushed with copious amounts of irrigation.  Bovie cauterization was performed for bleeders.  The wound was irrigated with Surgi-Flo with  thrombin for further bleeding.  This was then noted to be discontinued.  It was packed with a Surgicel.  The skin was reapproximated with a 3-0 nylon.  A large bulky dressing was applied to the right foot.  Patient was placed in an equalizer walker boot.  He has been instructed preoperatively for no weightbearing to the right foot and keep the foot elevated.    Patient tolerated the procedure and anesthesia well.  Was transported from the OR to the PACU with all vital signs stable and vascular status intact. To be discharged per routine protocol.  Will follow up in approximately 1 week in the outpatient clinic.

## 2018-10-28 NOTE — H&P (Signed)
HISTORY AND PHYSICAL INTERVAL NOTE:  10/28/2018  9:42 AM  Charles [name]  has presented today for surgery, with the diagnosis of skin ulcer right foot with fat layer exposed.  The various methods of treatment have been discussed with the patient.  No guarantees were given.  After consideration of risks, benefits and other options for treatment, the patient has consented to surgery.  I have reviewed the patients' chart and labs.     A history and physical examination was performed in my office.  The patient was reexamined.  There have been no changes to this history and physical examination.  Samara Deist A

## 2018-10-28 NOTE — Anesthesia Post-op Follow-up Note (Signed)
Anesthesia QCDR form completed.        

## 2018-10-28 NOTE — Progress Notes (Signed)
Ch visited pt in pre-op while doing rounds. Pt had a positive affect and did not present to be anxious about procedure to his R foot. Pt shared that he has been dealing with pain in his foot for about 6 months. Pt stated that he has delayed having the procedure b/c he needed to work. 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 Ch provided space for pt to share his concerns and how he has had to have a toe removed in the past. Ch asked if pt was a diabetic. Pt shared that he was thankfully not a diabetic. Ch provided words of encouragement for pt's procedure and healing process. Pt appreciated visit.  Pt shared that he will not be admitted. No further needs at this time.    10/28/18 0815  Clinical Encounter Type  Visited With Patient  Visit Type Spiritual support;Social support;Pre-op  Spiritual Encounters  Spiritual Needs Emotional;Grief support  Stress Factors  Patient Stress Factors Health changes;Loss of control  Family Stress Factors None identified

## 2018-10-28 NOTE — Anesthesia Postprocedure Evaluation (Signed)
Anesthesia Post Note  Patient: Charles [name]  Procedure(s) Performed: AMPUTATION TOE 67209 (Right ) METATARSAL HEAD EXCISION 28112 (Right )  Patient location during evaluation: PACU Anesthesia Type: General Level of consciousness: awake and alert Pain management: pain level controlled Vital Signs Assessment: post-procedure vital signs reviewed and stable Respiratory status: spontaneous breathing and respiratory function stable Cardiovascular status: stable Anesthetic complications: no     Last Vitals:  Vitals:   10/28/18 0807 10/28/18 1132  BP: 110/67 112/65  Pulse: 67 63  Resp: 16 13  Temp:  (!) 36.4 C  SpO2: 95% 99%    Last Pain:  Vitals:   10/28/18 1132  PainSc: 0-No pain                 KEPHART,WILLIAM K

## 2018-10-28 NOTE — Anesthesia Preprocedure Evaluation (Signed)
Anesthesia Evaluation  Patient identified by MRN, date of birth, ID band Patient awake    Reviewed: Allergy & Precautions, NPO status , Patient's Chart, lab work & pertinent test results, reviewed documented beta blocker date and time   History of Anesthesia Complications Negative for: history of anesthetic complications  Airway Mallampati: III       Dental  (+) Partial Upper   Pulmonary sleep apnea and Continuous Positive Airway Pressure Ventilation , neg COPD,           Cardiovascular hypertension, Pt. on medications and Pt. on home beta blockers + Past MI and + Cardiac Stents       Neuro/Psych neg Seizures Anxiety Depression    GI/Hepatic Neg liver ROS, PUD, GERD  Medicated,  Endo/Other  neg diabetes (resolved with gastric bypass)Hypothyroidism   Renal/GU negative Renal ROS     Musculoskeletal   Abdominal   Peds  Hematology   Anesthesia Other Findings   Reproductive/Obstetrics                             Anesthesia Physical Anesthesia Plan  ASA: III  Anesthesia Plan: General   Post-op Pain Management:    Induction: Intravenous  PONV Risk Score and Plan: 2 and Dexamethasone and Ondansetron  Airway Management Planned: LMA  Additional Equipment:   Intra-op Plan:   Post-operative Plan:   Informed Consent: I have reviewed the patients History and Physical, chart, labs and discussed the procedure including the risks, benefits and alternatives for the proposed anesthesia with the patient or authorized representative who has indicated his/her understanding and acceptance.       Plan Discussed with:   Anesthesia Plan Comments:         Anesthesia Quick Evaluation

## 2018-10-28 NOTE — Anesthesia Procedure Notes (Signed)
Date/Time: 10/28/2018 10:20 AM Performed by: Fredderick Phenix, CRNA Pre-anesthesia Checklist: Emergency Drugs available, Suction available, Patient being monitored and Timeout performed Patient Re-evaluated:Patient Re-evaluated prior to induction Preoxygenation: Pre-oxygenation with 100% oxygen Induction Type: IV induction Ventilation: Mask ventilation without difficulty LMA Size: 5.0 Number of attempts: 2 Placement Confirmation: CO2 detector and breath sounds checked- equal and bilateral

## 2018-10-28 NOTE — Transfer of Care (Signed)
Immediate Anesthesia Transfer of Care Note  Patient: Charles [name]  Procedure(s) Performed: AMPUTATION TOE 42683 (Right ) METATARSAL HEAD EXCISION 28112 (Right )  Patient Location: PACU  Anesthesia Type:General  Level of Consciousness: awake, alert  and oriented  Airway & Oxygen Therapy: Patient Spontanous Breathing  Post-op Assessment: Report given to RN and Post -op Vital signs reviewed and stable  Post vital signs: stable  Last Vitals:  Vitals Value Taken Time  BP 112/65 10/28/18 1132  Temp 36.4 C 10/28/18 1132  Pulse 56 10/28/18 1134  Resp 12 10/28/18 1134  SpO2 100 % 10/28/18 1134  Vitals shown include unvalidated device data.  Last Pain:  Vitals:   10/28/18 1132  PainSc: 0-No pain         Complications: No apparent anesthesia complications

## 2018-11-02 LAB — ANAEROBIC CULTURE

## 2018-11-03 DIAGNOSIS — L97512 Non-pressure chronic ulcer of other part of right foot with fat layer exposed: Secondary | ICD-10-CM | POA: Diagnosis not present

## 2018-11-03 LAB — SURGICAL PATHOLOGY

## 2018-11-17 DIAGNOSIS — L97512 Non-pressure chronic ulcer of other part of right foot with fat layer exposed: Secondary | ICD-10-CM | POA: Diagnosis not present

## 2018-11-22 ENCOUNTER — Ambulatory Visit: Payer: BLUE CROSS/BLUE SHIELD | Admitting: Nurse Practitioner

## 2018-11-24 DIAGNOSIS — L97512 Non-pressure chronic ulcer of other part of right foot with fat layer exposed: Secondary | ICD-10-CM | POA: Diagnosis not present

## 2018-11-28 ENCOUNTER — Other Ambulatory Visit: Payer: Self-pay | Admitting: Nurse Practitioner

## 2018-11-28 DIAGNOSIS — F411 Generalized anxiety disorder: Secondary | ICD-10-CM

## 2018-11-28 DIAGNOSIS — F329 Major depressive disorder, single episode, unspecified: Secondary | ICD-10-CM

## 2018-11-28 MED ORDER — DULOXETINE HCL 60 MG PO CPEP: 60.0000 mg | ORAL_CAPSULE | Freq: Two times a day (BID) | ORAL | 2 refills | Status: DC

## 2018-11-28 MED ORDER — BUSPIRONE HCL 30 MG PO TABS: 30.0000 mg | ORAL_TABLET | Freq: Three times a day (TID) | ORAL | 3 refills | Status: DC | PRN

## 2018-12-01 ENCOUNTER — Inpatient Hospital Stay: Payer: BC Managed Care – PPO

## 2018-12-01 ENCOUNTER — Ambulatory Visit: Payer: Self-pay | Admitting: Nurse Practitioner

## 2018-12-01 ENCOUNTER — Encounter: Payer: Self-pay | Admitting: Emergency Medicine

## 2018-12-01 ENCOUNTER — Other Ambulatory Visit: Payer: Self-pay

## 2018-12-01 ENCOUNTER — Inpatient Hospital Stay
Admission: AD | Admit: 2018-12-01 | Discharge: 2018-12-05 | DRG: 617 | Disposition: A | Discharge status: Home / Self Care | Payer: BC Managed Care – PPO | Source: Ambulatory Visit | Attending: Internal Medicine | Admitting: Internal Medicine

## 2018-12-01 DIAGNOSIS — Z9682 Presence of neurostimulator: Secondary | ICD-10-CM

## 2018-12-01 DIAGNOSIS — Z9884 Bariatric surgery status: Secondary | ICD-10-CM

## 2018-12-01 DIAGNOSIS — M1990 Unspecified osteoarthritis, unspecified site: Secondary | ICD-10-CM | POA: Diagnosis not present

## 2018-12-01 DIAGNOSIS — I10 Essential (primary) hypertension: Secondary | ICD-10-CM | POA: Diagnosis not present

## 2018-12-01 DIAGNOSIS — I2510 Atherosclerotic heart disease of native coronary artery without angina pectoris: Secondary | ICD-10-CM | POA: Diagnosis not present

## 2018-12-01 DIAGNOSIS — N400 Enlarged prostate without lower urinary tract symptoms: Secondary | ICD-10-CM | POA: Diagnosis present

## 2018-12-01 DIAGNOSIS — Z89431 Acquired absence of right foot: Secondary | ICD-10-CM | POA: Diagnosis not present

## 2018-12-01 DIAGNOSIS — F329 Major depressive disorder, single episode, unspecified: Secondary | ICD-10-CM | POA: Diagnosis present

## 2018-12-01 DIAGNOSIS — K219 Gastro-esophageal reflux disease without esophagitis: Secondary | ICD-10-CM | POA: Diagnosis present

## 2018-12-01 DIAGNOSIS — I25110 Atherosclerotic heart disease of native coronary artery with unstable angina pectoris: Secondary | ICD-10-CM | POA: Diagnosis not present

## 2018-12-01 DIAGNOSIS — Z955 Presence of coronary angioplasty implant and graft: Secondary | ICD-10-CM | POA: Diagnosis not present

## 2018-12-01 DIAGNOSIS — B9562 Methicillin resistant Staphylococcus aureus infection as the cause of diseases classified elsewhere: Secondary | ICD-10-CM | POA: Diagnosis present

## 2018-12-01 DIAGNOSIS — G894 Chronic pain syndrome: Secondary | ICD-10-CM | POA: Diagnosis present

## 2018-12-01 DIAGNOSIS — E11621 Type 2 diabetes mellitus with foot ulcer: Secondary | ICD-10-CM | POA: Diagnosis present

## 2018-12-01 DIAGNOSIS — M869 Osteomyelitis, unspecified: Secondary | ICD-10-CM | POA: Diagnosis not present

## 2018-12-01 DIAGNOSIS — Z79899 Other long term (current) drug therapy: Secondary | ICD-10-CM | POA: Diagnosis not present

## 2018-12-01 DIAGNOSIS — J45909 Unspecified asthma, uncomplicated: Secondary | ICD-10-CM | POA: Diagnosis not present

## 2018-12-01 DIAGNOSIS — E119 Type 2 diabetes mellitus without complications: Secondary | ICD-10-CM | POA: Diagnosis not present

## 2018-12-01 DIAGNOSIS — Z833 Family history of diabetes mellitus: Secondary | ICD-10-CM | POA: Diagnosis not present

## 2018-12-01 DIAGNOSIS — I252 Old myocardial infarction: Secondary | ICD-10-CM | POA: Diagnosis not present

## 2018-12-01 DIAGNOSIS — L02611 Cutaneous abscess of right foot: Secondary | ICD-10-CM | POA: Diagnosis present

## 2018-12-01 DIAGNOSIS — M79671 Pain in right foot: Secondary | ICD-10-CM | POA: Diagnosis not present

## 2018-12-01 DIAGNOSIS — Z79890 Hormone replacement therapy (postmenopausal): Secondary | ICD-10-CM | POA: Diagnosis not present

## 2018-12-01 DIAGNOSIS — E1169 Type 2 diabetes mellitus with other specified complication: Principal | ICD-10-CM | POA: Diagnosis present

## 2018-12-01 DIAGNOSIS — E039 Hypothyroidism, unspecified: Secondary | ICD-10-CM | POA: Diagnosis not present

## 2018-12-01 DIAGNOSIS — Z89421 Acquired absence of other right toe(s): Secondary | ICD-10-CM

## 2018-12-01 DIAGNOSIS — Z20828 Contact with and (suspected) exposure to other viral communicable diseases: Secondary | ICD-10-CM | POA: Diagnosis not present

## 2018-12-01 DIAGNOSIS — L03115 Cellulitis of right lower limb: Secondary | ICD-10-CM | POA: Diagnosis present

## 2018-12-01 DIAGNOSIS — R0600 Dyspnea, unspecified: Secondary | ICD-10-CM

## 2018-12-01 DIAGNOSIS — F419 Anxiety disorder, unspecified: Secondary | ICD-10-CM | POA: Diagnosis present

## 2018-12-01 DIAGNOSIS — E785 Hyperlipidemia, unspecified: Secondary | ICD-10-CM | POA: Diagnosis present

## 2018-12-01 DIAGNOSIS — L97519 Non-pressure chronic ulcer of other part of right foot with unspecified severity: Secondary | ICD-10-CM | POA: Diagnosis not present

## 2018-12-01 DIAGNOSIS — E1151 Type 2 diabetes mellitus with diabetic peripheral angiopathy without gangrene: Secondary | ICD-10-CM | POA: Diagnosis present

## 2018-12-01 DIAGNOSIS — T148XXA Other injury of unspecified body region, initial encounter: Secondary | ICD-10-CM | POA: Diagnosis present

## 2018-12-01 DIAGNOSIS — Z09 Encounter for follow-up examination after completed treatment for conditions other than malignant neoplasm: Secondary | ICD-10-CM

## 2018-12-01 DIAGNOSIS — E1142 Type 2 diabetes mellitus with diabetic polyneuropathy: Secondary | ICD-10-CM | POA: Diagnosis not present

## 2018-12-01 DIAGNOSIS — M86171 Other acute osteomyelitis, right ankle and foot: Secondary | ICD-10-CM | POA: Diagnosis not present

## 2018-12-01 DIAGNOSIS — R7301 Impaired fasting glucose: Secondary | ICD-10-CM | POA: Diagnosis present

## 2018-12-01 DIAGNOSIS — L8990 Pressure ulcer of unspecified site, unspecified stage: Secondary | ICD-10-CM | POA: Insufficient documentation

## 2018-12-01 LAB — CBC WITH DIFFERENTIAL/PLATELET
Abs Immature Granulocytes: 0.12 10*3/uL — ABNORMAL HIGH (ref 0.00–0.07)
Basophils Absolute: 0 10*3/uL (ref 0.0–0.1)
Basophils Relative: 0 %
Eosinophils Absolute: 0.2 10*3/uL (ref 0.0–0.5)
Eosinophils Relative: 2 %
HCT: 36.6 % — ABNORMAL LOW (ref 39.0–52.0)
Hemoglobin: 11.7 g/dL — ABNORMAL LOW (ref 13.0–17.0)
Immature Granulocytes: 1 %
Lymphocytes Relative: 12 %
Lymphs Abs: 1.6 10*3/uL (ref 0.7–4.0)
MCH: 29.5 pg (ref 26.0–34.0)
MCHC: 32 g/dL (ref 30.0–36.0)
MCV: 92.2 fL (ref 80.0–100.0)
Monocytes Absolute: 0.8 10*3/uL (ref 0.1–1.0)
Monocytes Relative: 6 %
Neutro Abs: 10.8 10*3/uL — ABNORMAL HIGH (ref 1.7–7.7)
Neutrophils Relative %: 79 %
Platelets: 278 10*3/uL (ref 150–400)
RBC: 3.97 MIL/uL — ABNORMAL LOW (ref 4.22–5.81)
RDW: 14.6 % (ref 11.5–15.5)
WBC: 13.6 10*3/uL — ABNORMAL HIGH (ref 4.0–10.5)
nRBC: 0 % (ref 0.0–0.2)

## 2018-12-01 LAB — COMPREHENSIVE METABOLIC PANEL
ALT: 15 U/L (ref 0–44)
AST: 16 U/L (ref 15–41)
Albumin: 3.3 g/dL — ABNORMAL LOW (ref 3.5–5.0)
Alkaline Phosphatase: 97 U/L (ref 38–126)
Anion gap: 8 (ref 5–15)
BUN: 13 mg/dL (ref 6–20)
CO2: 26 mmol/L (ref 22–32)
Calcium: 8.6 mg/dL — ABNORMAL LOW (ref 8.9–10.3)
Chloride: 101 mmol/L (ref 98–111)
Creatinine, Ser: 0.86 mg/dL (ref 0.61–1.24)
GFR calc Af Amer: >60 mL/min (ref >60)
GFR calc non Af Amer: >60 mL/min (ref >60)
Glucose, Bld: 124 mg/dL — ABNORMAL HIGH (ref 70–99)
Potassium: 3.5 mmol/L (ref 3.5–5.1)
Sodium: 135 mmol/L (ref 135–145)
Total Bilirubin: 0.8 mg/dL (ref 0.3–1.2)
Total Protein: 7.1 g/dL (ref 6.5–8.1)

## 2018-12-01 LAB — APTT: aPTT: 36 seconds (ref 24–36)

## 2018-12-01 LAB — PROTIME-INR
INR: 1.3 — ABNORMAL HIGH (ref 0.8–1.2)
Prothrombin Time: 15.7 seconds — ABNORMAL HIGH (ref 11.4–15.2)

## 2018-12-01 LAB — GLUCOSE, CAPILLARY: Glucose-Capillary: 115 mg/dL — ABNORMAL HIGH (ref 70–99)

## 2018-12-01 LAB — HEMOGLOBIN A1C
Hgb A1c MFr Bld: 5.5 % (ref 4.8–5.6)
Mean Plasma Glucose: 111.15 mg/dL

## 2018-12-01 LAB — SURGICAL PCR SCREEN
MRSA, PCR: POSITIVE — AB
Staphylococcus aureus: POSITIVE — AB

## 2018-12-01 LAB — MAGNESIUM: Magnesium: 1.9 mg/dL (ref 1.7–2.4)

## 2018-12-01 LAB — SARS CORONAVIRUS 2 BY RT PCR (HOSPITAL ORDER, PERFORMED IN ~~LOC~~ HOSPITAL LAB): SARS Coronavirus 2: NEGATIVE

## 2018-12-01 MED ORDER — ALBUTEROL SULFATE (2.5 MG/3ML) 0.083% IN NEBU: 2.5000 mg | INHALATION_SOLUTION | Freq: Four times a day (QID) | RESPIRATORY_TRACT | Status: DC | PRN

## 2018-12-01 MED ORDER — ONDANSETRON HCL 4 MG/2ML IJ SOLN: 4.0000 mg | Freq: Four times a day (QID) | INTRAMUSCULAR | Status: DC | PRN

## 2018-12-01 MED ORDER — MORPHINE SULFATE (PF) 4 MG/ML IV SOLN
4.0000 mg | Freq: Once | INTRAVENOUS | Status: AC
  Administered 2018-12-01: 4 mg via INTRAVENOUS
  Filled 2018-12-01: qty 1

## 2018-12-01 MED ORDER — VANCOMYCIN HCL 10 G IV SOLR
2500.0000 mg | Freq: Once | INTRAVENOUS | Status: AC
  Administered 2018-12-01: 22:00:00 2500 mg via INTRAVENOUS
  Filled 2018-12-01: qty 2500

## 2018-12-01 MED ORDER — CHLORHEXIDINE GLUCONATE CLOTH 2 % EX PADS
6.0000 | MEDICATED_PAD | Freq: Every day | CUTANEOUS | Status: DC
  Administered 2018-12-03 – 2018-12-05 (×2): 6 via TOPICAL

## 2018-12-01 MED ORDER — INSULIN ASPART 100 UNIT/ML ~~LOC~~ SOLN
0.0000 [IU] | Freq: Three times a day (TID) | SUBCUTANEOUS | Status: DC
  Administered 2018-12-02: 3 [IU] via SUBCUTANEOUS
  Administered 2018-12-03: 8 [IU] via SUBCUTANEOUS
  Administered 2018-12-04: 3 [IU] via SUBCUTANEOUS
  Administered 2018-12-04: 5 [IU] via SUBCUTANEOUS
  Administered 2018-12-04 – 2018-12-05 (×2): 2 [IU] via SUBCUTANEOUS
  Filled 2018-12-01 (×6): qty 1

## 2018-12-01 MED ORDER — PREGABALIN 75 MG PO CAPS
75.0000 mg | ORAL_CAPSULE | Freq: Three times a day (TID) | ORAL | Status: DC
  Administered 2018-12-01 – 2018-12-05 (×11): 75 mg via ORAL
  Filled 2018-12-01 (×11): qty 1

## 2018-12-01 MED ORDER — VANCOMYCIN HCL IN DEXTROSE 1-5 GM/200ML-% IV SOLN: 1000.0000 mg | Freq: Once | INTRAVENOUS | Status: DC

## 2018-12-01 MED ORDER — OXYCODONE-ACETAMINOPHEN 5-325 MG PO TABS
1.0000 | ORAL_TABLET | Freq: Four times a day (QID) | ORAL | Status: DC | PRN
  Administered 2018-12-01 – 2018-12-02 (×3): 2 via ORAL
  Filled 2018-12-01 (×3): qty 2

## 2018-12-01 MED ORDER — DULOXETINE HCL 60 MG PO CPEP
60.0000 mg | ORAL_CAPSULE | Freq: Two times a day (BID) | ORAL | Status: DC
  Administered 2018-12-01 – 2018-12-05 (×8): 60 mg via ORAL
  Filled 2018-12-01 (×9): qty 1

## 2018-12-01 MED ORDER — VANCOMYCIN HCL 1.25 G IV SOLR
1250.0000 mg | Freq: Two times a day (BID) | INTRAVENOUS | Status: DC
  Administered 2018-12-02 – 2018-12-03 (×3): 1250 mg via INTRAVENOUS
  Filled 2018-12-01 (×4): qty 1250

## 2018-12-01 MED ORDER — POLYETHYLENE GLYCOL 3350 17 G PO PACK: 17.0000 g | PACK | Freq: Every day | ORAL | Status: DC | PRN

## 2018-12-01 MED ORDER — INSULIN ASPART 100 UNIT/ML ~~LOC~~ SOLN
0.0000 [IU] | Freq: Every day | SUBCUTANEOUS | Status: DC
  Administered 2018-12-03: 3 [IU] via SUBCUTANEOUS
  Filled 2018-12-01: qty 1

## 2018-12-01 MED ORDER — ATORVASTATIN CALCIUM 20 MG PO TABS
40.0000 mg | ORAL_TABLET | Freq: Every day | ORAL | Status: DC
  Administered 2018-12-02 – 2018-12-04 (×3): 40 mg via ORAL
  Filled 2018-12-01 (×3): qty 2

## 2018-12-01 MED ORDER — LEVOTHYROXINE SODIUM 50 MCG PO TABS
75.0000 ug | ORAL_TABLET | Freq: Every day | ORAL | Status: DC
  Administered 2018-12-04 – 2018-12-05 (×2): 75 ug via ORAL
  Filled 2018-12-01 (×2): qty 1

## 2018-12-01 MED ORDER — ONDANSETRON HCL 4 MG PO TABS: 4.0000 mg | ORAL_TABLET | Freq: Four times a day (QID) | ORAL | Status: DC | PRN

## 2018-12-01 MED ORDER — CARVEDILOL 12.5 MG PO TABS
12.5000 mg | ORAL_TABLET | Freq: Two times a day (BID) | ORAL | Status: DC
  Administered 2018-12-02 – 2018-12-04 (×5): 12.5 mg via ORAL
  Filled 2018-12-01 (×7): qty 1

## 2018-12-01 MED ORDER — MUPIROCIN 2 % EX OINT
1.0000 "application " | TOPICAL_OINTMENT | Freq: Two times a day (BID) | CUTANEOUS | Status: DC
  Administered 2018-12-02 – 2018-12-05 (×7): 1 via NASAL
  Filled 2018-12-01: qty 22

## 2018-12-01 MED ORDER — AMLODIPINE BESYLATE 5 MG PO TABS
5.0000 mg | ORAL_TABLET | Freq: Every day | ORAL | Status: DC
  Administered 2018-12-02 – 2018-12-05 (×3): 5 mg via ORAL
  Filled 2018-12-01 (×4): qty 1

## 2018-12-01 MED ORDER — MORPHINE SULFATE ER 30 MG PO TBCR
30.0000 mg | EXTENDED_RELEASE_TABLET | Freq: Two times a day (BID) | ORAL | Status: DC
  Administered 2018-12-01 – 2018-12-05 (×8): 30 mg via ORAL
  Filled 2018-12-01 (×8): qty 1

## 2018-12-01 MED ORDER — PIPERACILLIN-TAZOBACTAM 3.375 G IVPB 30 MIN: 3.3750 g | Freq: Once | INTRAVENOUS | Status: DC

## 2018-12-01 MED ORDER — PIPERACILLIN-TAZOBACTAM 3.375 G IVPB
3.3750 g | Freq: Three times a day (TID) | INTRAVENOUS | Status: DC
  Administered 2018-12-02 – 2018-12-03 (×5): 3.375 g via INTRAVENOUS
  Filled 2018-12-01 (×5): qty 50

## 2018-12-01 MED ORDER — ADULT MULTIVITAMIN W/MINERALS CH
1.0000 | ORAL_TABLET | Freq: Every day | ORAL | Status: DC
  Administered 2018-12-02 – 2018-12-05 (×4): 1 via ORAL
  Filled 2018-12-01 (×4): qty 1

## 2018-12-01 MED ORDER — SODIUM CHLORIDE 0.9 % IV SOLN
INTRAVENOUS | Status: DC
  Administered 2018-12-01 – 2018-12-04 (×3): via INTRAVENOUS

## 2018-12-01 MED ORDER — TAMSULOSIN HCL 0.4 MG PO CAPS
0.4000 mg | ORAL_CAPSULE | Freq: Every day | ORAL | Status: DC
  Administered 2018-12-02 – 2018-12-05 (×4): 0.4 mg via ORAL
  Filled 2018-12-01 (×4): qty 1

## 2018-12-01 NOTE — Consult Note (Signed)
[facility] Nurse wound consult note Reason for Consult:Right plantar foot ulcer.  WOC Nursing and Podiatry are simultaneously consulted and Dr. Luana Shu (DPM) has already seen patient and established the POC. Recommendations are in this note for topical care, plan to review MRI tomorrow and decide if surgical (amputation) plan is necessary.  No role for WOC nursing at this time. [facility] nursing team will not follow, but will remain available to this patient, the nursing and medical teams.  Please re-consult if needed. Thanks, Maudie Flakes, MSN, RN, [place], Arther Abbott  Pager# [phone]
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
## 2018-12-01 NOTE — Progress Notes (Signed)
Pharmacy Antibiotic Note  Charles [name] is a 58 y.o. male admitted on 12/01/2018 with worsening right foot ulcer. Concern for osteomyelitis. MRI ordered to assess. Pharmacy has been consulted for vancomycin and Zosyn dosing.  Plan: Vancomycin 2.5 g IV x 1 loading dose followed by vancomycin 1250 mg IV Q 12 hrs.  Goal AUC 400-550. Expected AUC: 426 SCr used: 0.86   Weight: 280 lb 3.2 oz (127.1 kg)  Temp (24hrs), Avg:98.5 F (36.9 C), Min:98.5 F (36.9 C), Max:98.5 F (36.9 C)  Recent Labs  Lab 12/01/18 1851  WBC 13.6*  CREATININE 0.86    Estimated Creatinine Clearance: 130.8 mL/min (by C-G formula based on SCr of 0.86 mg/dL).    Allergies  Allergen Reactions  . Lisinopril Cough    Antimicrobials this admission: Vancomycin 8/13 >> Zosyn 8/13 >>  Dose adjustments this admission: NA  Microbiology results: 8/13 BCx: pending 8/13 MRSA PCR: pending  Thank you for allowing pharmacy to be a part of this patient's care.  Tawnya Crook, PharmD 12/01/2018 8:13 PM
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
## 2018-12-02 ENCOUNTER — Inpatient Hospital Stay: Payer: BC Managed Care – PPO

## 2018-12-02 LAB — CBC
HCT: 35.6 % — ABNORMAL LOW (ref 39.0–52.0)
Hemoglobin: 11.3 g/dL — ABNORMAL LOW (ref 13.0–17.0)
MCH: 28.7 pg (ref 26.0–34.0)
MCHC: 31.7 g/dL (ref 30.0–36.0)
MCV: 90.4 fL (ref 80.0–100.0)
Platelets: 267 10*3/uL (ref 150–400)
RBC: 3.94 MIL/uL — ABNORMAL LOW (ref 4.22–5.81)
RDW: 14.7 % (ref 11.5–15.5)
WBC: 11.4 10*3/uL — ABNORMAL HIGH (ref 4.0–10.5)
nRBC: 0 % (ref 0.0–0.2)

## 2018-12-02 LAB — BASIC METABOLIC PANEL
Anion gap: 6 (ref 5–15)
BUN: 13 mg/dL (ref 6–20)
CO2: 27 mmol/L (ref 22–32)
Calcium: 8.4 mg/dL — ABNORMAL LOW (ref 8.9–10.3)
Chloride: 106 mmol/L (ref 98–111)
Creatinine, Ser: 0.75 mg/dL (ref 0.61–1.24)
GFR calc Af Amer: >60 mL/min (ref >60)
GFR calc non Af Amer: >60 mL/min (ref >60)
Glucose, Bld: 103 mg/dL — ABNORMAL HIGH (ref 70–99)
Potassium: 3.9 mmol/L (ref 3.5–5.1)
Sodium: 139 mmol/L (ref 135–145)

## 2018-12-02 LAB — GLUCOSE, CAPILLARY
Glucose-Capillary: 107 mg/dL — ABNORMAL HIGH (ref 70–99)
Glucose-Capillary: 173 mg/dL — ABNORMAL HIGH (ref 70–99)
Glucose-Capillary: 89 mg/dL (ref 70–99)
Glucose-Capillary: 128 mg/dL — ABNORMAL HIGH (ref 70–99)

## 2018-12-02 LAB — PROTIME-INR
INR: 1.1 (ref 0.8–1.2)
Prothrombin Time: 13.7 seconds (ref 11.4–15.2)

## 2018-12-02 MED ORDER — IOHEXOL 300 MG/ML  SOLN
100.0000 mL | Freq: Once | INTRAMUSCULAR | Status: AC | PRN
  Administered 2018-12-02: 100 mL via INTRAVENOUS

## 2018-12-02 MED ORDER — OXYCODONE-ACETAMINOPHEN 5-325 MG PO TABS
1.0000 | ORAL_TABLET | ORAL | Status: DC | PRN
  Administered 2018-12-02 – 2018-12-04 (×4): 2 via ORAL
  Filled 2018-12-02 (×4): qty 2

## 2018-12-02 MED ORDER — NALOXEGOL OXALATE 25 MG PO TABS
25.0000 mg | ORAL_TABLET | Freq: Every day | ORAL | Status: DC
  Administered 2018-12-02 – 2018-12-05 (×4): 25 mg via ORAL
  Filled 2018-12-02 (×4): qty 1

## 2018-12-02 NOTE — Progress Notes (Signed)
[facility] [place] NAME: Charles [name]    MR#:  [id number]  DATE OF BIRTH:  03/19/1961  SUBJECTIVE:   Patient presented to the hospital due to a nonhealing right foot ulcer and suspected to have osteomyelitis.  Awaiting MRI of the right foot and plan for possible surgery tomorrow.  No fever, chills or any other associated symptoms presently.  REVIEW OF SYSTEMS:    Review of Systems  Constitutional: Negative for chills and fever.  HENT: Negative for congestion and tinnitus.   Eyes: Negative for blurred vision and double vision.  Respiratory: Negative for cough, shortness of breath and wheezing.   Cardiovascular: Negative for chest pain, orthopnea and PND.  Gastrointestinal: Negative for abdominal pain, diarrhea, nausea and vomiting.  Genitourinary: Negative for dysuria and hematuria.  Musculoskeletal: Positive for joint pain (Right foot ).  Neurological: Negative for dizziness, sensory change and focal weakness.  All other systems reviewed and are negative.   Nutrition: Heart Healthy/Carb modified Tolerating Diet: Yes Tolerating PT: Await Eval.   DRUG ALLERGIES:   Allergies  Allergen Reactions  . Lisinopril Cough    VITALS:  Blood pressure 131/71, pulse 63, temperature 98.6 F (37 C), temperature source Oral, resp. rate 16, height 6\' 1"  (1.854 m), weight 127.1 kg, SpO2 96 %.  PHYSICAL EXAMINATION:   Physical Exam  GENERAL:  58 y.o.-year-old patient lying in bed in no acute distress.  EYES: Pupils equal, round, reactive to light and accommodation. No scleral icterus. Extraocular muscles intact.  HEENT: Head atraumatic, normocephalic. Oropharynx and nasopharynx clear.  NECK:  Supple, no jugular venous distention. No thyroid enlargement, no tenderness.  LUNGS: Normal breath sounds bilaterally, no wheezing, rales, rhonchi. No use of accessory muscles of respiration.  CARDIOVASCULAR: S1, S2 normal. No murmurs, rubs, or  gallops.  ABDOMEN: Soft, nontender, nondistended. Bowel sounds present. No organomegaly or mass.  EXTREMITIES: No cyanosis, clubbing or edema b/l.    NEUROLOGIC: Cranial nerves II through XII are intact. No focal Motor or sensory deficits b/l.   PSYCHIATRIC: The patient is alert and oriented x 3.  SKIN: No obvious rash, lesion, right foot ulcer as shown below.         LABORATORY PANEL:   CBC Recent Labs  Lab 12/02/18 0409  WBC 11.4*  HGB 11.3*  HCT 35.6*  PLT 267   ------------------------------------------------------------------------------------------------------------------  Chemistries  Recent Labs  Lab 12/01/18 1851 12/02/18 0409  NA 135 139  K 3.5 3.9  CL 101 106  CO2 26 27  GLUCOSE 124* 103*  BUN 13 13  CREATININE 0.86 0.75  CALCIUM 8.6* 8.4*  MG 1.9  --   AST 16  --   ALT 15  --   ALKPHOS 97  --   BILITOT 0.8  --    ------------------------------------------------------------------------------------------------------------------  Cardiac Enzymes No results for input(s): TROPONINI in the last 168 hours. ------------------------------------------------------------------------------------------------------------------  RADIOLOGY:  Portable Chest 1 View  Result Date: 12/01/2018 CLINICAL DATA:  Dyspnea EXAM: PORTABLE CHEST 1 VIEW COMPARISON:  11/17/2016 FINDINGS: No focal opacity or pleural effusion. Stable cardiomediastinal silhouette. Chronic fracture deformity of the mid left clavicle. Thoracic stimulator leads and spinal hardware noted. IMPRESSION: No active disease. Electronically Signed   By: Donavan Foil M.D.   On: 12/01/2018 19:38     ASSESSMENT AND PLAN:   58 year old male with past medical history of hypertension, hypothyroidism, neuropathy, obstructive sleep apnea, coronary artery disease, GERD, peripheral vascular disease, previous history of MI who  presented to the hospital due to a right foot ulcer which has not healed.  1.  Right  diabetic foot ulcer- patient is followed by podiatry and also has not been healing with outpatient antibiotics and wound care.  There is suspicion for possible underlying osteomyelitis. - Await MRI of the right foot. -Continue broad-spectrum IV antibiotics with vancomycin, Zosyn. - Podiatry following and plan for possible surgery tomorrow.  2.  Diabetes type 2 without complication- continue carb controlled diet, sliding scale insulin. -Follow blood sugars which are currently stable.  3.  Hypothyroidism-continue Synthroid.  4.  Chronic pain-continue patient's MS Contin and oxycodone as needed.  5.  Diabetic neuropathy-continue Lyrica  6.  Essential hypertension-continue carvedilol, Norvasc  7. BPH - cont. Flomax.  - no urinary retention.   8. Hyperlipidemia - cont. Atorvastatin.    All the records are reviewed and case discussed with Care Management/Social Worker. Management plans discussed with the patient, family and they are in agreement.  CODE STATUS: Full code  DVT Prophylaxis: Will resume post-operatively.   TOTAL TIME TAKING CARE OF THIS PATIENT: 30 minutes.   POSSIBLE D/C IN 2-3 DAYS, DEPENDING ON CLINICAL CONDITION.   Henreitta Leber M.D on 12/02/2018 at 1:58 PM  Between 7am to 6pm - Pager - [phone]  After 6pm go to [email or web address] - password [email or web address] [facility]  Office  [phone]  CC: Primary care physician; Lavera Guise, MD
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
## 2018-12-02 NOTE — Progress Notes (Signed)
Pt refuses bed alarm. Pt educated and still refuses bed alarm.

## 2018-12-02 NOTE — Progress Notes (Signed)
PT Cancellation Note  Patient Details Name: Charles [name] MRN: [id number] DOB: 07-19-1960   Cancelled Treatment:    Reason Eval/Treat Not Completed: Other (comment). Consult received and chart reviewed. Pt with acute osteomyelitis in R foot. Spoke with Dr. Luana Shu via secure chat. Agreed to hold therapy evaluation until after possible surgery this weekend. Per Dr. Luana Shu will remain R NWB post surgery. Will complete current PT order at this time, please re-order when pt able to participate.   Miara Emminger 12/02/2018, 1:47 PM  Greggory Stallion, PT, DPT [phone]

## 2018-12-02 NOTE — Progress Notes (Signed)
Pharmacy Antibiotic Note  Charles [name] is a 58 y.o. male admitted on 12/01/2018 with worsening right foot ulcer. Concern for osteomyelitis. MRI ordered to assess. Pharmacy has been consulted for vancomycin and Zosyn dosing.  Plan: Vancomycin 2.5 g IV x 1 loading dose followed by vancomycin 1250 mg IV Q 12 hrs.  Goal AUC 400-550. Expected AUC: 426 SCr used: 0.86  -Zosyn 3.375gm IV EI q8h   Height: 6\' 1"  (185.4 cm) Weight: 280 lb 3.2 oz (127.1 kg) IBW/kg (Calculated) : 79.9  Temp (24hrs), Avg:98.4 F (36.9 C), Min:98 F (36.7 C), Max:98.6 F (37 C)  Recent Labs  Lab 12/01/18 1851 12/02/18 0409  WBC 13.6* 11.4*  CREATININE 0.86 0.75    Estimated Creatinine Clearance: 140.7 mL/min (by C-G formula based on SCr of 0.75 mg/dL).    Allergies  Allergen Reactions  . Lisinopril Cough    Antimicrobials this admission: Vancomycin 8/13 >> Zosyn 8/13 >>  Dose adjustments this admission: NA  Microbiology results: 8/13 BCx: pending 8/13 MRSA PCR: pending 8/14 Foot cx: mod GPC, still pending  Thank you for allowing pharmacy to be a part of this patient's care.  Janifer Gieselman A, PharmD 12/02/2018 11:26 AM

## 2018-12-02 NOTE — Progress Notes (Addendum)
Reviewed CT imaging.  Patient unable to have MRI, contraindicated for patient type.  Based on clinical examination and imaging, pt has osteomyelitis of the R 2nd MTPJ and 3rd MTPJ as his bone is exposed at the bottom of the foot through the wound surface.  Patient understands and is agreeable.  Discussed all treatment options with the patient both conservative and surgical attempts at correction.  Discussed risks complications and benefits of the procedure in detail along with postoperative course.  At this time patient has elected for surgery consisting of right transmetatarsal amputation with tendo Achilles lengthening and application of posterior splint.  Patient would like to proceed at this time.  Patient will be placed n.p.o. at midnight on 12/03/2018 for surgery on the same day, tentatively scheduled for 1300.  Will call operating room in the morning to determine if that time is still available.  If not, due to other urgent surgical procedures, may move the procedure to Sunday morning.

## 2018-12-03 ENCOUNTER — Encounter
Admission: AD | Disposition: A | Discharge status: Home / Self Care | Payer: Self-pay | Source: Ambulatory Visit | Attending: Internal Medicine

## 2018-12-03 ENCOUNTER — Inpatient Hospital Stay: Payer: BC Managed Care – PPO | Admitting: Anesthesiology

## 2018-12-03 ENCOUNTER — Inpatient Hospital Stay: Payer: BC Managed Care – PPO

## 2018-12-03 HISTORY — PX: TRANSMETATARSAL AMPUTATION: SHX6197

## 2018-12-03 HISTORY — PX: ACHILLES TENDON SURGERY: SHX542

## 2018-12-03 LAB — CREATININE, SERUM
Creatinine, Ser: 0.77 mg/dL (ref 0.61–1.24)
GFR calc Af Amer: >60 mL/min (ref >60)
GFR calc non Af Amer: >60 mL/min (ref >60)

## 2018-12-03 LAB — C-REACTIVE PROTEIN: CRP: 12.4 mg/dL — ABNORMAL HIGH (ref <1.0)

## 2018-12-03 LAB — GLUCOSE, CAPILLARY
Glucose-Capillary: 105 mg/dL — ABNORMAL HIGH (ref 70–99)
Glucose-Capillary: 114 mg/dL — ABNORMAL HIGH (ref 70–99)
Glucose-Capillary: 287 mg/dL — ABNORMAL HIGH (ref 70–99)
Glucose-Capillary: 296 mg/dL — ABNORMAL HIGH (ref 70–99)

## 2018-12-03 LAB — CBC
HCT: 34 % — ABNORMAL LOW (ref 39.0–52.0)
Hemoglobin: 10.8 g/dL — ABNORMAL LOW (ref 13.0–17.0)
MCH: 28.9 pg (ref 26.0–34.0)
MCHC: 31.8 g/dL (ref 30.0–36.0)
MCV: 90.9 fL (ref 80.0–100.0)
Platelets: 261 10*3/uL (ref 150–400)
RBC: 3.74 MIL/uL — ABNORMAL LOW (ref 4.22–5.81)
RDW: 14.6 % (ref 11.5–15.5)
WBC: 8.3 10*3/uL (ref 4.0–10.5)
nRBC: 0 % (ref 0.0–0.2)

## 2018-12-03 LAB — SEDIMENTATION RATE: Sed Rate: 67 mm/hr — ABNORMAL HIGH (ref 0–20)

## 2018-12-03 LAB — HIV ANTIBODY (ROUTINE TESTING W REFLEX): HIV Screen 4th Generation wRfx: NONREACTIVE

## 2018-12-03 SURGERY — AMPUTATION, FOOT, TRANSMETATARSAL
Anesthesia: General | Laterality: Right

## 2018-12-03 MED ORDER — DEXAMETHASONE SODIUM PHOSPHATE 10 MG/ML IJ SOLN
INTRAMUSCULAR | Status: DC | PRN
  Administered 2018-12-03: 10 mg via INTRAVENOUS

## 2018-12-03 MED ORDER — ACETAMINOPHEN 10 MG/ML IV SOLN
INTRAVENOUS | Status: DC | PRN
  Administered 2018-12-03: 1000 mg via INTRAVENOUS

## 2018-12-03 MED ORDER — ONDANSETRON HCL 4 MG/2ML IJ SOLN
INTRAMUSCULAR | Status: DC | PRN
  Administered 2018-12-03: 4 mg via INTRAVENOUS

## 2018-12-03 MED ORDER — PROMETHAZINE HCL 25 MG/ML IJ SOLN: 6.2500 mg | INTRAMUSCULAR | Status: DC | PRN

## 2018-12-03 MED ORDER — FENTANYL CITRATE (PF) 100 MCG/2ML IJ SOLN
INTRAMUSCULAR | Status: AC
  Filled 2018-12-03: qty 2

## 2018-12-03 MED ORDER — MIDAZOLAM HCL 2 MG/2ML IJ SOLN
INTRAMUSCULAR | Status: AC
  Filled 2018-12-03: qty 2

## 2018-12-03 MED ORDER — PROPOFOL 10 MG/ML IV BOLUS
INTRAVENOUS | Status: DC | PRN
  Administered 2018-12-03: 200 mg via INTRAVENOUS

## 2018-12-03 MED ORDER — PROPOFOL 10 MG/ML IV BOLUS
INTRAVENOUS | Status: AC
  Filled 2018-12-03: qty 20

## 2018-12-03 MED ORDER — FENTANYL CITRATE (PF) 100 MCG/2ML IJ SOLN: 25.0000 ug | INTRAMUSCULAR | Status: DC | PRN

## 2018-12-03 MED ORDER — MEPERIDINE HCL 50 MG/ML IJ SOLN: 6.2500 mg | INTRAMUSCULAR | Status: DC | PRN

## 2018-12-03 MED ORDER — LACTATED RINGERS IV SOLN
INTRAVENOUS | Status: DC | PRN
  Administered 2018-12-03: 12:00:00 via INTRAVENOUS

## 2018-12-03 MED ORDER — BUPIVACAINE HCL 0.5 % IJ SOLN
INTRAMUSCULAR | Status: DC | PRN
  Administered 2018-12-03: 20 mL
  Administered 2018-12-03: 10 mL

## 2018-12-03 MED ORDER — ACETAMINOPHEN 325 MG PO TABS: 325.0000 mg | ORAL_TABLET | ORAL | Status: DC | PRN

## 2018-12-03 MED ORDER — ACETAMINOPHEN 10 MG/ML IV SOLN
INTRAVENOUS | Status: AC
  Filled 2018-12-03: qty 100

## 2018-12-03 MED ORDER — LIDOCAINE HCL (CARDIAC) PF 100 MG/5ML IV SOSY
PREFILLED_SYRINGE | INTRAVENOUS | Status: DC | PRN
  Administered 2018-12-03: 100 mg via INTRAVENOUS

## 2018-12-03 MED ORDER — FENTANYL CITRATE (PF) 100 MCG/2ML IJ SOLN
INTRAMUSCULAR | Status: DC | PRN
  Administered 2018-12-03: 25 ug via INTRAVENOUS
  Administered 2018-12-03: 50 ug via INTRAVENOUS
  Administered 2018-12-03: 25 ug via INTRAVENOUS

## 2018-12-03 MED ORDER — BUPIVACAINE HCL (PF) 0.5 % IJ SOLN
INTRAMUSCULAR | Status: AC
  Filled 2018-12-03: qty 30

## 2018-12-03 MED ORDER — MIDAZOLAM HCL 2 MG/2ML IJ SOLN
INTRAMUSCULAR | Status: DC | PRN
  Administered 2018-12-03: 2 mg via INTRAVENOUS

## 2018-12-03 MED ORDER — ACETAMINOPHEN 160 MG/5ML PO SOLN
325.0000 mg | ORAL | Status: DC | PRN
  Filled 2018-12-03: qty 20.3

## 2018-12-03 MED ORDER — PIPERACILLIN-TAZOBACTAM 3.375 G IVPB
3.3750 g | Freq: Three times a day (TID) | INTRAVENOUS | Status: DC
  Administered 2018-12-03 – 2018-12-04 (×3): 3.375 g via INTRAVENOUS
  Filled 2018-12-03 (×4): qty 50

## 2018-12-03 MED ORDER — VANCOMYCIN HCL 1.25 G IV SOLR
1250.0000 mg | Freq: Two times a day (BID) | INTRAVENOUS | Status: DC
  Administered 2018-12-03 – 2018-12-04 (×3): 1250 mg via INTRAVENOUS
  Filled 2018-12-03 (×5): qty 1250

## 2018-12-03 SURGICAL SUPPLY — 61 items
"PENCIL ELECTRO HAND CTR " (MISCELLANEOUS) ×1 IMPLANT
BLADE OSC/SAGITTAL MD 9X18.5 (BLADE) ×2 IMPLANT
BLADE OSCILLATING/SAGITTAL (BLADE) ×1
BLADE SURG 15 STRL LF DISP TIS (BLADE) ×1 IMPLANT
BLADE SURG 15 STRL SS (BLADE) ×1
BLADE SURG MINI STRL (BLADE) ×4 IMPLANT
BLADE SW THK.38XMED LNG THN (BLADE) ×1 IMPLANT
BNDG COHESIVE 4X5 TAN STRL (GAUZE/BANDAGES/DRESSINGS) ×1 IMPLANT
BNDG CONFORM 3 STRL LF (GAUZE/BANDAGES/DRESSINGS) ×1 IMPLANT
BNDG ELASTIC 4X5.8 VLCR NS LF (GAUZE/BANDAGES/DRESSINGS) ×2 IMPLANT
BNDG ESMARK 4X12 TAN STRL LF (GAUZE/BANDAGES/DRESSINGS) ×2 IMPLANT
BNDG GAUZE 4.5X4.1 6PLY STRL (MISCELLANEOUS) ×1 IMPLANT
CANISTER SUCT 1200ML W/VALVE (MISCELLANEOUS) ×2 IMPLANT
COVER WAND RF STERILE (DRAPES) ×2 IMPLANT
CUFF TOURN SGL QUICK 12 (TOURNIQUET CUFF) IMPLANT
CUFF TOURN SGL QUICK 18X4 (TOURNIQUET CUFF) ×1 IMPLANT
DRAIN PENROSE 1/4X12 LTX (DRAIN) ×1 IMPLANT
DRAPE FLUOR MINI C-ARM 54X84 (DRAPES) ×1 IMPLANT
DRESSING ALLEVYN 4X4 (MISCELLANEOUS) IMPLANT
DURAPREP 26ML APPLICATOR (WOUND CARE) ×1 IMPLANT
ELECT REM PT RETURN 9FT ADLT (ELECTROSURGICAL) ×2
ELECTRODE REM PT RTRN 9FT ADLT (ELECTROSURGICAL) ×1 IMPLANT
GAUZE SPONGE 4X4 12PLY STRL (GAUZE/BANDAGES/DRESSINGS) ×3 IMPLANT
GAUZE XEROFORM 1X8 LF (GAUZE/BANDAGES/DRESSINGS) ×3 IMPLANT
GLOVE BIO SURGEON STRL SZ7.5 (GLOVE) ×2 IMPLANT
GLOVE INDICATOR 8.0 STRL GRN (GLOVE) ×2 IMPLANT
GOWN STRL REUS W/ TWL LRG LVL3 (GOWN DISPOSABLE) ×2 IMPLANT
GOWN STRL REUS W/TWL LRG LVL3 (GOWN DISPOSABLE) ×2
HANDLE YANKAUER SUCT BULB TIP (MISCELLANEOUS) ×2 IMPLANT
HANDPIECE VERSAJET DEBRIDEMENT (MISCELLANEOUS) IMPLANT
KIT TURNOVER KIT A (KITS) ×2 IMPLANT
LABEL OR SOLS (LABEL) ×1 IMPLANT
NDL HYPO 25X1 1.5 SAFETY (NEEDLE) ×2 IMPLANT
NDL SAFETY ECLIPSE 18X1.5 (NEEDLE) ×1 IMPLANT
NEEDLE HYPO 18GX1.5 SHARP (NEEDLE) ×1
NEEDLE HYPO 25X1 1.5 SAFETY (NEEDLE) ×4 IMPLANT
NS IRRIG 500ML POUR BTL (IV SOLUTION) ×2 IMPLANT
PACK EXTREMITY ARMC (MISCELLANEOUS) ×2 IMPLANT
PAD ABD DERMACEA PRESS 5X9 (GAUZE/BANDAGES/DRESSINGS) ×2 IMPLANT
PAD CAST CTTN 4X4 STRL (SOFTGOODS) IMPLANT
PADDING CAST COTTON 4X4 STRL (SOFTGOODS) ×4
PENCIL ELECTRO HAND CTR (MISCELLANEOUS) ×2 IMPLANT
PULSAVAC PLUS IRRIG FAN TIP (DISPOSABLE) ×2
SHIELD FULL FACE ANTIFOG 7M (MISCELLANEOUS) ×2 IMPLANT
SOL PREP PVP 2OZ (MISCELLANEOUS) ×2
SOLUTION PREP PVP 2OZ (MISCELLANEOUS) ×1 IMPLANT
SPONGE LAP 18X18 RF (DISPOSABLE) ×2 IMPLANT
STAPLER SKIN PROX 35W (STAPLE) ×1 IMPLANT
STOCKINETTE M/LG 89821 (MISCELLANEOUS) ×2 IMPLANT
STOCKINETTE STRL 6IN 960660 (GAUZE/BANDAGES/DRESSINGS) ×2 IMPLANT
STRIP CLOSURE SKIN 1/4X4 (GAUZE/BANDAGES/DRESSINGS) ×2 IMPLANT
SUT ETHILON 2 0 FS 18 (SUTURE) ×2 IMPLANT
SUT ETHILON 2 0 FSLX (SUTURE) ×1 IMPLANT
SUT ETHILON 3-0 FS-10 30 BLK (SUTURE) ×4
SUT VIC AB 2-0 CT1 27 (SUTURE)
SUT VIC AB 2-0 CT1 TAPERPNT 27 (SUTURE) ×1 IMPLANT
SUT VIC AB 2-0 SH 27 (SUTURE) ×1
SUT VIC AB 2-0 SH 27XBRD (SUTURE) ×1 IMPLANT
SUTURE EHLN 3-0 FS-10 30 BLK (SUTURE) ×2 IMPLANT
SYR 10ML LL (SYRINGE) ×2 IMPLANT
TIP FAN IRRIG PULSAVAC PLUS (DISPOSABLE) IMPLANT

## 2018-12-03 NOTE — Progress Notes (Signed)
Patient ID: Charles [name], male   DOB: Jul 05, 1960, 58 y.o.   MRN: [id number]  [facility] PROGRESS NOTE  BRALYN FOLKERT FVC:[id number] DOB: [birth year] DOA: 12/01/2018 PCP: Lavera Guise, MD  HPI/Subjective: Patient feeling okay.  He was seen this morning prior to surgery.  Only had a little discomfort in his foot.  Redness and swelling in his leg has gone down since coming in.  Objective: Vitals:   12/03/18 1346 12/03/18 1400  BP: 131/76   Pulse:  64  Resp: 15 (!) 6  Temp: 98.5 F (36.9 C)   SpO2: 96% 92%    Filed Weights   12/01/18 1900  Weight: 127.1 kg    ROS: Review of Systems  Constitutional: Negative for chills and fever.  Eyes: Negative for blurred vision.  Respiratory: Negative for cough and shortness of breath.   Cardiovascular: Negative for chest pain.  Gastrointestinal: Negative for abdominal pain, constipation, diarrhea, nausea and vomiting.  Genitourinary: Negative for dysuria.  Musculoskeletal: Positive for joint pain.  Neurological: Negative for dizziness and headaches.   Exam: Physical Exam  Constitutional: He is oriented to person, place, and time.  HENT:  Nose: No mucosal edema.  Mouth/Throat: No oropharyngeal exudate or posterior oropharyngeal edema.  Eyes: Pupils are equal, round, and reactive to light. Conjunctivae, EOM and lids are normal.  Neck: No JVD present. Carotid bruit is not present. No edema present. No thyroid mass and no thyromegaly present.  Cardiovascular: S1 normal and S2 normal. Exam reveals no gallop.  No murmur heard. Pulses:      Dorsalis pedis pulses are 2+ on the right side and 2+ on the left side.  Respiratory: No respiratory distress. He has no wheezes. He has no rhonchi. He has no rales.  GI: Soft. Bowel sounds are normal. There is no abdominal tenderness.  Musculoskeletal:     Right ankle: He exhibits swelling.     Left ankle: He exhibits swelling.  Lymphadenopathy:    He has no cervical adenopathy.   Neurological: He is alert and oriented to person, place, and time. No cranial nerve deficit.  Skin: Skin is warm. Nails show no clubbing.  Right foot covered with bandage.  Redness on the right leg has faded and decreased. Left leg chronic skin discoloration.  Psychiatric: He has a normal mood and affect.      Data Reviewed: Basic Metabolic Panel: Recent Labs  Lab 12/01/18 1851 12/02/18 0409 12/03/18 0424  NA 135 139  --   K 3.5 3.9  --   CL 101 106  --   CO2 26 27  --   GLUCOSE 124* 103*  --   BUN 13 13  --   CREATININE 0.86 0.75 0.77  CALCIUM 8.6* 8.4*  --   MG 1.9  --   --    Liver Function Tests: Recent Labs  Lab 12/01/18 1851  AST 16  ALT 15  ALKPHOS 97  BILITOT 0.8  PROT 7.1  ALBUMIN 3.3*  CBC: Recent Labs  Lab 12/01/18 1851 12/02/18 0409 12/03/18 0424  WBC 13.6* 11.4* 8.3  NEUTROABS 10.8*  --   --   HGB 11.7* 11.3* 10.8*  HCT 36.6* 35.6* 34.0*  MCV 92.2 90.4 90.9  PLT 278 267 261    CBG: Recent Labs  Lab 12/02/18 1128 12/02/18 1644 12/02/18 2110 12/03/18 0756 12/03/18 1353  GLUCAP 173* 89 128* 105* 114*    Recent Results (from the past 240 hour(s))  Culture, blood (routine x 2)  Status: None (Preliminary result)   Collection Time: 12/01/18  6:51 PM   Specimen: BLOOD  Result Value Ref Range Status   Specimen Description BLOOD LEFT ASSIST CONTROL  Final   Special Requests   Final    BOTTLES DRAWN AEROBIC AND ANAEROBIC Blood Culture results may not be optimal due to an inadequate volume of blood received in culture bottles   Culture   Final    NO GROWTH 2 DAYS Performed at [facility], [address] [place], [place] [address]    Report Status PENDING  Incomplete  SARS Coronavirus 2 [facility] order, Performed in [facility] hospital lab) Nasopharyngeal Nasopharyngeal Swab     Status: None   Collection Time: 12/01/18  7:37 PM   Specimen: Nasopharyngeal Swab  Result Value Ref Range Status   SARS Coronavirus 2 NEGATIVE  NEGATIVE Final    Comment: (NOTE) If result is NEGATIVE SARS-CoV-2 target nucleic acids are NOT DETECTED. The SARS-CoV-2 RNA is generally detectable in upper and lower  respiratory specimens during the acute phase of infection. The lowest  concentration of SARS-CoV-2 viral copies this assay can detect is 250  copies / mL. A negative result does not preclude SARS-CoV-2 infection  and should not be used as the sole basis for treatment or other  patient management decisions.  A negative result may occur with  improper specimen collection / handling, submission of specimen other  than nasopharyngeal swab, presence of viral mutation(s) within the  areas targeted by this assay, and inadequate number of viral copies  (<250 copies / mL). A negative result must be combined with clinical  observations, patient history, and epidemiological information. If result is POSITIVE SARS-CoV-2 target nucleic acids are DETECTED. The SARS-CoV-2 RNA is generally detectable in upper and lower  respiratory specimens dur ing the acute phase of infection.  Positive  results are indicative of active infection with SARS-CoV-2.  Clinical  correlation with patient history and other diagnostic information is  necessary to determine patient infection status.  Positive results do  not rule out bacterial infection or co-infection with other viruses. If result is PRESUMPTIVE POSTIVE SARS-CoV-2 nucleic acids MAY BE PRESENT.   A presumptive positive result was obtained on the submitted specimen  and confirmed on repeat testing.  While 2019 novel coronavirus  (SARS-CoV-2) nucleic acids may be present in the submitted sample  additional confirmatory testing may be necessary for epidemiological  and / or clinical management purposes  to differentiate between  SARS-CoV-2 and other Sarbecovirus currently known to infect humans.  If clinically indicated additional testing with an alternate test  methodology [phone]) is  advised. The SARS-CoV-2 RNA is generally  detectable in upper and lower respiratory sp ecimens during the acute  phase of infection. The expected result is Negative. Fact Sheet for Patients:  StrictlyIdeas.no Fact Sheet for Healthcare Providers: BankingDealers.co.za This test is not yet approved or cleared by the [place] FDA and has been authorized for detection and/or diagnosis of SARS-CoV-2 by FDA under an Emergency Use Authorization (EUA).  This EUA will remain in effect (meaning this test can be used) for the duration of the COVID-19 declaration under Section 564(b)(1) of the Act, 21 U.S.C. section 360bbb-3(b)(1), unless the authorization is terminated or revoked sooner. Performed at [facility], [address] [place], [place] [address]   Surgical pcr screen     Status: Abnormal   Collection Time: 12/01/18  7:37 PM   Specimen: Nasal Mucosa; Nasal Swab  Result Value Ref  Range Status   MRSA, PCR POSITIVE (A) NEGATIVE Final    Comment: CRITICAL RESULT CALLED TO, READ BACK BY AND VERIFIED WITH: Florence Canner RN AT 2237 ON 12/01/2018 SNG    Staphylococcus aureus POSITIVE (A) NEGATIVE Final    Comment: (NOTE) The Xpert SA Assay (FDA approved for NASAL specimens in patients 24 years of age and older), is one component of a comprehensive surveillance program. It is not intended to diagnose infection nor to guide or monitor treatment. Performed at [facility], [address] [place], [place] [address]   Culture, blood (routine x 2)     Status: None (Preliminary result)   Collection Time: 12/01/18  7:43 PM   Specimen: BLOOD  Result Value Ref Range Status   Specimen Description BLOOD LEFT ARM  Final   Special Requests   Final    BOTTLES DRAWN AEROBIC AND ANAEROBIC Blood Culture adequate volume   Culture   Final    NO GROWTH 2 DAYS Performed at [facility], [address] [place], [place]  [address]    Report Status PENDING  Incomplete  Aerobic Culture (superficial specimen)     Status: None (Preliminary result)   Collection Time: 12/02/18 12:32 AM   Specimen: Foot; Wound  Result Value Ref Range Status   Specimen Description   Final    FOOT RIGHT Performed at [facility], [address] [place], [place] [address]    Special Requests   Final    Normal Performed at [facility], [address] [place], [place] [address]    Gram Stain   Final    RARE WBC PRESENT, PREDOMINANTLY PMN MODERATE GRAM POSITIVE COCCI Performed at [facility], [place] [address] [place], [place] [address]    Culture ABUNDANT STAPHYLOCOCCUS AUREUS  Final   Report Status PENDING  Incomplete     Studies: Ct Foot Right W Contrast  Result Date: 12/02/2018 CLINICAL DATA:  Osteomyelitis, foot ulcer, diabetic EXAM: CT OF THE LOWER RIGHT EXTREMITY WITH CONTRAST TECHNIQUE: Multidetector CT imaging of the lower right extremity was performed according to the standard protocol following intravenous contrast administration. COMPARISON:  None. CONTRAST:  144m OMNIPAQUE IOHEXOL 300 MG/ML  SOLN FINDINGS: Bones/Joint/Cartilage The patient is status post transmetatarsal amputation of the first toe and amputation of the second metatarsal head. Areas of heterotopic ossification are seen within the region of the second metatarsal head. There is been prior ORIF with screw fixation seen through the calcaneus. No definite area of cortical destruction. There is periosteal reaction seen in the second proximal phalanx. No acute fracture is identified. There is diffuse osteopenia. Ligaments Suboptimally assessed by CT. Muscles and Tendons The muscles and tendons appear to be grossly intact. There is diffuse dorsal soft tissue swelling. The Achilles tendon is intact. Soft tissues There is also soft tissue swelling seen at the plantar surface of the forefoot. There is a area of ulceration measuring 1.4 cm  beneath the area of the third metatarsal head. There is overlying soft tissue swelling and a heterogeneous low-density non loculated fluid collection within this area measuring 1 cm best seen on series 8, image 72. At the first metatarsal amputation site there is a small fluid collection measuring 2.4 x 1.0 x 1.5 cm with overlying skin thickening. There is a trace ankle joint effusion. IMPRESSION: 1. Status post first transmetatarsal amputation and second metatarsal head amputation. 2. Focal area of ulceration on the plantar surface beneath the third metatarsal head with diffuse  soft tissue swelling and non loculated fluid which could be early phlegmon. No definite areas of cortical destruction/osteomyelitis. If clinical concern remains, recommend MRI for further evaluation. 3. Within the first metatarsal amputation site small focal fluid collection measuring 2.4 x 1.0 x 1.5 cm. This could represent postoperative collection, sterile or infected. Electronically Signed   By: Prudencio Pair M.D.   On: 12/02/2018 15:11   Portable Chest 1 View  Result Date: 12/01/2018 CLINICAL DATA:  Dyspnea EXAM: PORTABLE CHEST 1 VIEW COMPARISON:  11/17/2016 FINDINGS: No focal opacity or pleural effusion. Stable cardiomediastinal silhouette. Chronic fracture deformity of the mid left clavicle. Thoracic stimulator leads and spinal hardware noted. IMPRESSION: No active disease. Electronically Signed   By: Donavan Foil M.D.   On: 12/01/2018 19:38    Scheduled Meds: . [MAR Hold] amLODipine  5 mg Oral Daily  . [MAR Hold] atorvastatin  40 mg Oral q1800  . [MAR Hold] carvedilol  12.5 mg Oral BID WC  . [MAR Hold] Chlorhexidine Gluconate Cloth  6 each Topical Q0600  . [MAR Hold] DULoxetine  60 mg Oral BID  . [MAR Hold] insulin aspart  0-15 Units Subcutaneous TID WC  . [MAR Hold] insulin aspart  0-5 Units Subcutaneous QHS  . [MAR Hold] levothyroxine  75 mcg Oral Q0600  . [MAR Hold] morphine  30 mg Oral Q12H  . [MAR Hold]  multivitamin with minerals  1 tablet Oral Daily  . [MAR Hold] mupirocin ointment  1 application Nasal BID  . [MAR Hold] naloxegol oxalate  25 mg Oral Daily  . [MAR Hold] pregabalin  75 mg Oral TID  . [MAR Hold] tamsulosin  0.4 mg Oral Daily   Continuous Infusions: . sodium chloride Stopped (12/02/18 1506)  . [MAR Hold] piperacillin-tazobactam (ZOSYN)  IV    . [MAR Hold] vancomycin      Assessment/Plan:  1. Osteomyelitis and right foot diabetic ulceration.  Patient went to the OR today for trans-metatarsal amputation.  Continue aggressive antibiotics with vancomycin and Zosyn and follow-up cultures. 2. Type 2 diabetes mellitus with complication of right foot diabetic ulceration nonhealing and neuropathy.  Patient on sliding scale. 3. Hypothyroidism on Synthroid 4. Chronic pain syndrome on MS Contin and oxycodone 5. Diabetic neuropathy on Lyrica 6. Hypertension on Coreg and Norvasc 7. BPH on Flomax 8. Hyperlipidemia on atorvastatin  Code Status:     Code Status Orders  (From admission, onward)         Start     Ordered   12/01/18 1833  Full code  Continuous     12/01/18 1832        Code Status History    Date Active Date Inactive Code Status Order ID Comments User Context   10/28/2018 1212 10/28/2018 1548 Full Code [id number]  Samara Deist, [facility] Inpatient   08/20/2017 1530 08/25/2017 2002 Full Code [id number]  Epifanio Lesches, MD ED   02/19/2017 1838 02/26/2017 1359 Full Code [id number]  Max Sane, MD Inpatient   02/17/2017 1711 02/17/2017 2034 Full Code [id number]  Samara Deist, [place] Inpatient   02/10/2017 1516 02/10/2017 1856 Full Code [id number]  Samara Deist, DPM Inpatient   11/17/2016 1515 11/19/2016 1437 Full Code [id number]  Bettey Costa, MD Inpatient   07/10/2015 0500 07/11/2015 1714 Full Code [id number]  Lance Coon, MD Inpatient   06/27/2015 1359 06/28/2015 1213 Full Code [id number]  Yolonda Kida, MD Inpatient   06/25/2015 2010 06/27/2015 1359 Full Code [id number]   Vaughan Basta, MD Inpatient   Advance Care Planning Activity  Advance Directive Documentation     Most Recent Value  Type of Advance Directive  Healthcare Power of Attorney, Living will  Pre-existing out of facility DNR order (yellow form or pink MOST form)  -  "MOST" Form in Place?  -      Disposition Plan: To be determined  Consultants:  Podiatry  Procedures:  Trans-met amputation  Antibiotics:  Vancomycin  Zosyn  Time spent: 28 minutes  [place]

## 2018-12-03 NOTE — Anesthesia Procedure Notes (Signed)
Procedure Name: LMA Insertion Date/Time: 12/03/2018 12:14 PM Performed by: Doreen Salvage, CRNA Pre-anesthesia Checklist: Patient identified, Patient being monitored, Timeout performed, Emergency Drugs available and Suction available Patient Re-evaluated:Patient Re-evaluated prior to induction Oxygen Delivery Method: Circle system utilized Preoxygenation: Pre-oxygenation with 100% oxygen Induction Type: IV induction Ventilation: Mask ventilation without difficulty LMA: LMA inserted LMA Size: 5.0 Tube type: Oral Number of attempts: 1 Placement Confirmation: positive ETCO2 and breath sounds checked- equal and bilateral Tube secured with: Tape Dental Injury: Teeth and Oropharynx as per pre-operative assessment

## 2018-12-03 NOTE — Anesthesia Post-op Follow-up Note (Signed)
Anesthesia QCDR form completed.        

## 2018-12-03 NOTE — Transfer of Care (Signed)
Immediate Anesthesia Transfer of Care Note  Patient: Charles [name]  Procedure(s) Performed: Procedure(s): TRANSMETATARSAL AMPUTATION RIGHT FOOT (Right) ACHILLES LENGTHENING/KIDNER (Right)  Patient Location: PACU  Anesthesia Type:General  Level of Consciousness: sedated  Airway & Oxygen Therapy: Patient Spontanous Breathing and Patient connected to face mask oxygen  Post-op Assessment: Report given to RN and Post -op Vital signs reviewed and stable  Post vital signs: Reviewed and stable  Last Vitals:  Vitals:   12/03/18 0754 12/03/18 1346  BP: 137/71 131/76  Pulse: (!) 59   Resp: 18 15  Temp: 37.2 C 36.9 C  SpO2: 01% 09%    Complications: No apparent anesthesia complications

## 2018-12-03 NOTE — Progress Notes (Signed)
Pharmacy Antibiotic Note  Charles [name] is a 58 y.o. male admitted on 12/01/2018 with worsening right foot ulcer with osteomyelitis. Pharmacy has been consulted for vancomycin and Zosyn dosing.  Plan: Patient to have right transmetatarsal amputation.   Serum creatinine remains stable. Follow-up serum creatinine with am labs.   Continue vancomycin 1250mg  IV Q12hrs.   Continue Zosyn EI 3.375g IV Q8hr.   Height: 6\' 1"  (185.4 cm) Weight: 280 lb 3.2 oz (127.1 kg) IBW/kg (Calculated) : 79.9  Temp (24hrs), Avg:98.3 F (36.8 C), Min:97.9 F (36.6 C), Max:98.9 F (37.2 C)  Recent Labs  Lab 12/01/18 1851 12/02/18 0409 12/03/18 0424  WBC 13.6* 11.4* 8.3  CREATININE 0.86 0.75 0.77    Estimated Creatinine Clearance: 140.7 mL/min (by C-G formula based on SCr of 0.77 mg/dL).    Allergies  Allergen Reactions  . Lisinopril Cough    Antimicrobials this admission: Vancomycin 8/13 >> Zosyn 8/13 >>  Dose adjustments this admission: NA  Microbiology results: 8/13 BCx: no growth x 2 days  8/13 MRSA PCR: positive 8/14 Foot cx: abundant staph aureus  8/13 COVID: negative   Thank you for allowing pharmacy to be a part of this patient's care.  Donevan Biller L, PharmD 12/03/2018 11:51 AM

## 2018-12-03 NOTE — Progress Notes (Signed)
Patient returned to floor after PACU. Alert and oriented, resting in bed with ace wrap to right lower leg and elevated on pillow. Wife at bedside.  No distress noted.

## 2018-12-03 NOTE — H&P (Signed)
HISTORY AND PHYSICAL INTERVAL NOTE:  12/03/2018  11:56 AM  Charles [name]  has presented today for surgery, with the diagnosis of R foot osteomyelitis of 2nd and 3rd MTPJs with exposed bone through the plantar wound and cellulitis associated to this condition.  Reviewed XR and CT findings.  Pt unable to obtain MRI due to neurostimulator.  Elevated WBC, ESR, CRP on admission.  The various methods of treatment have been discussed with the patient.  No guarantees were given.  After consideration of risks, benefits and other options for treatment, the patient has consented to surgery consisting of R TMA with TAL and application of posterior splint.  I have reviewed the patients' chart and labs.     A history and physical examination was performed in my office.  The patient was reexamined.  There have been no changes to this history and physical examination.  Caroline More

## 2018-12-03 NOTE — Op Note (Addendum)
PODIATRY / FOOT AND ANKLE SURGERY OPERATIVE REPORT    SURGEON: Caroline More, DPM  PRE-OPERATIVE DIAGNOSIS:  1.  Right foot osteomyelitis 2nd and 3rd MTPJs 2.  Cellulitis right foot 3.  Abscess right foot 4.  Ulceration subsecond and third metatarsals right foot with necrosis of bone 5.  Diabetes type 2 with polyneuropathy 6.  Charcot neuroarthropathy 7.  Gastroc soleal equinus right lower extremity  POST-OPERATIVE DIAGNOSIS: Same  PROCEDURE(S): 1. Right foot transmetatarsal amputation 2. Right tendo Achilles lengthening 3. Application of posterior splint right lower extremity  HEMOSTASIS: High ankle tourniquet tourniquet, 65-minute time  ANESTHESIA: MAC with local  ESTIMATED BLOOD LOSS: 10 cc  FINDING(S): 1.  Osteomyelitis of the right second and third MPJs 2.  Abscess right third metatarsal phalangeal joint  PATHOLOGY/SPECIMEN(S): Right forefoot pathologic specimen, wound culture right foot wound subsecond and third metatarsals  INDICATIONS:   Charles [name] is a 58 y.o. male who presents with an ulceration to the right plantar second and third metatarsals.  Patient was seen in clinic by Dr. Luana Shu on Thursday of this week and was noted to have foul drainage and odor coming from his ulceration and the wound appears to probe to bone at the second and third metatarsal phalangeal joints.  The patient was placed in a total contact cast at his prior examination but he removed the cast due to increased pain, discomfort, and swelling.  He was subsequently sent to [facility] for admission due to worsening right foot infection.  Patient had x-ray imaging and CT scan imaging performed showing questionable changes at the second and third metatarsal phalangeal joints with possible abscess of the third MPJ.  Patient was unable to obtain an MRI due to having a neurostimulator in place.  Based on imaging and clinical examination, it was determined to perform a transmetatarsal  amputation is the best course of action.  The patient also has gastroc soleal equinus at this time of the right lower extremity.  Discussed all treatment options with the patient both conservative and surgical attempts at correction at this time patient is elected for surgery consisting of right transmetatarsal amputation and tendo Achilles lengthening.  Patient understands and is aware of the postoperative phase.  DESCRIPTION: After obtaining full informed written consent, the patient was brought back to the operating room and placed supine upon the operating table.  The patient received IV antibiotics prior to induction.  After obtaining adequate anesthesia local anesthesia was injected about the right ankle and Achilles tendon utilizing a total of 20 cc of half percent Marcaine plain. The patient was prepped and draped in the standard fashion.  And Esmarch bandage was used to exsanguinate the patient's right lower extremity and pneumatic ankle tourniquet was inflated.  Attention was then directed to the posterior aspect of the right heel in the area of the Achilles tendon.  3 separate incisions were made over the Achilles tendon to being slightly medial and one being lateral approximately 2 to 3 cm apart.  The central incision was made in a lateral direction and the distal and proximal incisions were made in the medial directions.  At this time the hemi-section of the Achilles tendon was performed and the foot was maximally dorsiflexed during that procedure.  Increased dorsiflexion was noted across the ankle joint to approximately 10 to 15 degrees with the knee extended indicating successful lengthening.  The incision sites were then reapproximated well coapted with staples.  Attention was then directed to the right forefoot  where incision was drawn out for transmetatarsal amputation utilizing a fishmouth type incision with a plantar of the resection of the ulceration that was present.  The incision was  made straight to bone at that time.  The distal first metatarsal, distal second metatarsal, and MPJs 3 through 5 were identified.  At MPJs 3 through 5 the extensor tendons were released followed by release the collateral and suspensory ligaments and the attachments to the plantar plate or flexor tendon.  At this time the digits 3 through 5 were disarticulated and passed off in the operative site.  During this procedure a large amount of purulence was expressed from the ulceration as well as the third metatarsal phalangeal joint area consistent with osteomyelitic changes.  The second and third MPJ regions appeared to be eroded likely from osteomyelitic changes at this time.  Circumferential dissection was then continued about the first through fifth metatarsals.  Once this was performed a sagittal bone saw was then used to resect metatarsals 1 through 5 to the proximal shaft or base region with the appropriate beveling in the appropriate lengths.  The distal shaft portions as well as midshaft portions were then resected and passed off in the operative site.  The forefoot samples were then passed off in the operative site and sent for pathology.  The surgical site was flushed with copious amounts normal sterile saline utilizing 2 L of pulse lavage.  The tissues appeared to be healthy and viable at this time with no further evidence of osteomyelitis or necrosis.  The dorsal and plantar flaps were debulked as necessary and the tendinous materials were cut as far proximally as possible.  The Bovie was used to cauterize any bleeding vessels at this time.  The deep subcutaneous tissue was then reapproximated well coapted with 3-0 Vicryl along with the superficial subcu to obtain apposition of the skin flaps.  3-0 nylon and staples were then used to reapproximate well coapted the incision site making a T type of pattern at the central aspect due to the plantar ulceration that was present previously.  An additional 10 cc  of half percent Marcaine plain was injected about the operative site.  A postoperative dressing was then applied consisting of Xeroform to the incisions sites followed by 4 x 4 gauze, ABD, Kerlix, soft roll, posterior splint, and Ace wrap.  The pneumatic ankle tourniquet was deflated prior to placing the posterior splint and hemostasis appeared to be well achieved and the flaps had capillary fill time intact to the right foot.  The patient tolerated the procedure and anesthesia well was transferred to recovery room fossae and stable vascular status intact to the flaps of the amputation site.  Following a period of  Postoperative monitoring the patient will be discharged back to the inpatient room with the following written and oral postop instructions: Keep surgical dressings clean, dry, and intact, ice and elevate right lower extremity when at rest, remain nonweightbearing at all times right lower extremity, take postop pain medication and antibiotics as prescribed to prescribed, consult placed for physical therapy, and patient family requests social work visit.  We will continue to follow patient till discharge but believe all infection has been resected with amputation.  Likely can transition to oral antibiotics upon discharge from wound culture sensitivities.  COMPLICATIONS: None  CONDITION: Good, stable  Caroline More
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
## 2018-12-04 ENCOUNTER — Encounter: Payer: Self-pay | Admitting: Podiatry

## 2018-12-04 LAB — GLUCOSE, CAPILLARY
Glucose-Capillary: 142 mg/dL — ABNORMAL HIGH (ref 70–99)
Glucose-Capillary: 207 mg/dL — ABNORMAL HIGH (ref 70–99)
Glucose-Capillary: 165 mg/dL — ABNORMAL HIGH (ref 70–99)
Glucose-Capillary: 159 mg/dL — ABNORMAL HIGH (ref 70–99)

## 2018-12-04 LAB — AEROBIC CULTURE W GRAM STAIN (SUPERFICIAL SPECIMEN): Special Requests: NORMAL

## 2018-12-04 LAB — CREATININE, SERUM
Creatinine, Ser: 0.7 mg/dL (ref 0.61–1.24)
GFR calc Af Amer: >60 mL/min (ref >60)
GFR calc non Af Amer: >60 mL/min (ref >60)

## 2018-12-04 MED ORDER — OXYCODONE HCL 5 MG PO TABS
15.0000 mg | ORAL_TABLET | ORAL | Status: DC | PRN
  Administered 2018-12-04 – 2018-12-05 (×6): 15 mg via ORAL
  Filled 2018-12-04 (×6): qty 3

## 2018-12-04 MED ORDER — MORPHINE SULFATE (PF) 2 MG/ML IV SOLN
2.0000 mg | INTRAVENOUS | Status: DC | PRN
  Administered 2018-12-04 – 2018-12-05 (×3): 2 mg via INTRAVENOUS
  Filled 2018-12-04 (×3): qty 1

## 2018-12-04 MED ORDER — SODIUM CHLORIDE 0.9 % IV SOLN
2.0000 g | Freq: Three times a day (TID) | INTRAVENOUS | Status: DC
  Administered 2018-12-04 – 2018-12-05 (×2): 2 g via INTRAVENOUS
  Filled 2018-12-04 (×4): qty 2

## 2018-12-04 NOTE — Plan of Care (Signed)
°  Problem: Education: °Goal: Knowledge of General Education information will improve °Description: Including pain rating scale, medication(s)/side effects and non-pharmacologic comfort measures °Outcome: Progressing °  °Problem: Health Behavior/Discharge Planning: °Goal: Ability to manage health-related needs will improve °Outcome: Progressing °  °Problem: Clinical Measurements: °Goal: Ability to maintain clinical measurements within normal limits will improve °Outcome: Progressing °Goal: Will remain free from infection °Outcome: Progressing °Goal: Diagnostic test results will improve °Outcome: Progressing °Goal: Respiratory complications will improve °Outcome: Progressing °Goal: Cardiovascular complication will be avoided °Outcome: Progressing °  °Problem: Activity: °Goal: Risk for activity intolerance will decrease °Outcome: Progressing °  °Problem: Nutrition: °Goal: Adequate nutrition will be maintained °Outcome: Progressing °  °Problem: Coping: °Goal: Level of anxiety will decrease °Outcome: Progressing °  °Problem: Elimination: °Goal: Will not experience complications related to bowel motility °Outcome: Progressing °Goal: Will not experience complications related to urinary retention °Outcome: Progressing °  °Problem: Pain Managment: °Goal: General experience of comfort will improve °Outcome: Progressing °  °Problem: Safety: °Goal: Ability to remain free from injury will improve °Outcome: Progressing °  °Problem: Skin Integrity: °Goal: Risk for impaired skin integrity will decrease °Outcome: Progressing °  °Problem: Education: °Goal: Required Educational Video(s) °Outcome: Progressing °  °Problem: Clinical Measurements: °Goal: Postoperative complications will be avoided or minimized °Outcome: Progressing °  °Problem: Skin Integrity: °Goal: Demonstration of wound healing without infection will improve °Outcome: Progressing °  °

## 2018-12-04 NOTE — Progress Notes (Signed)
Patient ID: Charles [name], male   DOB: 1960/09/20, 58 y.o.   MRN: [id number]  [facility] PROGRESS NOTE  STEPHAN NELIS CZY:[id number] DOB: 1960-07-24 DOA: 12/01/2018 PCP: Lavera Guise, MD  HPI/Subjective: Patient doing okay postoperatively.  States he had about 6 out of 10 pain in his foot.  Patient did not ask many questions.  Objective: Vitals:   12/04/18 0345 12/04/18 0816  BP: 126/78 (!) 145/82  Pulse: (!) 49 (!) 51  Resp:  20  Temp: 97.8 F (36.6 C)   SpO2: 96% 96%    Filed Weights   12/01/18 1900  Weight: 127.1 kg    ROS: Review of Systems  Constitutional: Negative for chills and fever.  Eyes: Negative for blurred vision.  Respiratory: Negative for cough, shortness of breath and wheezing.   Cardiovascular: Negative for chest pain.  Gastrointestinal: Negative for abdominal pain, constipation, diarrhea, nausea and vomiting.  Genitourinary: Negative for dysuria.  Musculoskeletal: Positive for joint pain.  Neurological: Negative for dizziness and headaches.   Exam: Physical Exam  Constitutional: He is oriented to person, place, and time.  HENT:  Nose: No mucosal edema.  Mouth/Throat: No oropharyngeal exudate or posterior oropharyngeal edema.  Eyes: Pupils are equal, round, and reactive to light. Conjunctivae, EOM and lids are normal.  Neck: No JVD present. Carotid bruit is not present. No edema present. No thyroid mass and no thyromegaly present.  Cardiovascular: S1 normal and S2 normal. Exam reveals no gallop.  No murmur heard. Pulses:      Dorsalis pedis pulses are 2+ on the right side and 2+ on the left side.  Respiratory: No respiratory distress. He has no wheezes. He has no rhonchi. He has no rales.  GI: Soft. Bowel sounds are normal. There is no abdominal tenderness.  Musculoskeletal:     Right ankle: He exhibits swelling.     Left ankle: He exhibits swelling.  Lymphadenopathy:    He has no cervical adenopathy.  Neurological: He is alert and  oriented to person, place, and time. No cranial nerve deficit.  Skin: Skin is warm. Nails show no clubbing.  Right foot covered with bandage.  Redness on the right leg has faded and decreased. Left leg chronic skin discoloration.  Psychiatric: He has a normal mood and affect.      Data Reviewed: Basic Metabolic Panel: Recent Labs  Lab 12/01/18 1851 12/02/18 0409 12/03/18 0424 12/04/18 0411  NA 135 139  --   --   K 3.5 3.9  --   --   CL 101 106  --   --   CO2 26 27  --   --   GLUCOSE 124* 103*  --   --   BUN 13 13  --   --   CREATININE 0.86 0.75 0.77 0.70  CALCIUM 8.6* 8.4*  --   --   MG 1.9  --   --   --    Liver Function Tests: Recent Labs  Lab 12/01/18 1851  AST 16  ALT 15  ALKPHOS 97  BILITOT 0.8  PROT 7.1  ALBUMIN 3.3*  CBC: Recent Labs  Lab 12/01/18 1851 12/02/18 0409 12/03/18 0424  WBC 13.6* 11.4* 8.3  NEUTROABS 10.8*  --   --   HGB 11.7* 11.3* 10.8*  HCT 36.6* 35.6* 34.0*  MCV 92.2 90.4 90.9  PLT 278 267 261    CBG: Recent Labs  Lab 12/03/18 1353 12/03/18 1706 12/03/18 2112 12/04/18 0813 12/04/18 1232  GLUCAP 114* 287*  296* 142* 207*    Recent Results (from the past 240 hour(s))  Culture, blood (routine x 2)     Status: None (Preliminary result)   Collection Time: 12/01/18  6:51 PM   Specimen: BLOOD  Result Value Ref Range Status   Specimen Description BLOOD LEFT ASSIST CONTROL  Final   Special Requests   Final    BOTTLES DRAWN AEROBIC AND ANAEROBIC Blood Culture results may not be optimal due to an inadequate volume of blood received in culture bottles   Culture   Final    NO GROWTH 3 DAYS Performed at [facility], [address] [place], [place] [address]    Report Status PENDING  Incomplete  SARS Coronavirus 2 [facility] order, Performed in [facility] hospital lab) Nasopharyngeal Nasopharyngeal Swab     Status: None   Collection Time: 12/01/18  7:37 PM   Specimen: Nasopharyngeal Swab  Result Value Ref Range Status    SARS Coronavirus 2 NEGATIVE NEGATIVE Final    Comment: (NOTE) If result is NEGATIVE SARS-CoV-2 target nucleic acids are NOT DETECTED. The SARS-CoV-2 RNA is generally detectable in upper and lower  respiratory specimens during the acute phase of infection. The lowest  concentration of SARS-CoV-2 viral copies this assay can detect is 250  copies / mL. A negative result does not preclude SARS-CoV-2 infection  and should not be used as the sole basis for treatment or other  patient management decisions.  A negative result may occur with  improper specimen collection / handling, submission of specimen other  than nasopharyngeal swab, presence of viral mutation(s) within the  areas targeted by this assay, and inadequate number of viral copies  (<250 copies / mL). A negative result must be combined with clinical  observations, patient history, and epidemiological information. If result is POSITIVE SARS-CoV-2 target nucleic acids are DETECTED. The SARS-CoV-2 RNA is generally detectable in upper and lower  respiratory specimens dur ing the acute phase of infection.  Positive  results are indicative of active infection with SARS-CoV-2.  Clinical  correlation with patient history and other diagnostic information is  necessary to determine patient infection status.  Positive results do  not rule out bacterial infection or co-infection with other viruses. If result is PRESUMPTIVE POSTIVE SARS-CoV-2 nucleic acids MAY BE PRESENT.   A presumptive positive result was obtained on the submitted specimen  and confirmed on repeat testing.  While 2019 novel coronavirus  (SARS-CoV-2) nucleic acids may be present in the submitted sample  additional confirmatory testing may be necessary for epidemiological  and / or clinical management purposes  to differentiate between  SARS-CoV-2 and other Sarbecovirus currently known to infect humans.  If clinically indicated additional testing with an alternate test   methodology [phone]) is advised. The SARS-CoV-2 RNA is generally  detectable in upper and lower respiratory sp ecimens during the acute  phase of infection. The expected result is Negative. Fact Sheet for Patients:  StrictlyIdeas.no Fact Sheet for Healthcare Providers: BankingDealers.co.za This test is not yet approved or cleared by the [place] FDA and has been authorized for detection and/or diagnosis of SARS-CoV-2 by FDA under an Emergency Use Authorization (EUA).  This EUA will remain in effect (meaning this test can be used) for the duration of the COVID-19 declaration under Section 564(b)(1) of the Act, 21 U.S.C. section 360bbb-3(b)(1), unless the authorization is terminated or revoked sooner. Performed at [facility], [address] [place], [place] [address]   Surgical pcr screen  Status: Abnormal   Collection Time: 12/01/18  7:37 PM   Specimen: Nasal Mucosa; Nasal Swab  Result Value Ref Range Status   MRSA, PCR POSITIVE (A) NEGATIVE Final    Comment: CRITICAL RESULT CALLED TO, READ BACK BY AND VERIFIED WITH: Florence Canner RN AT 2237 ON 12/01/2018 SNG    Staphylococcus aureus POSITIVE (A) NEGATIVE Final    Comment: (NOTE) The Xpert SA Assay (FDA approved for NASAL specimens in patients 55 years of age and older), is one component of a comprehensive surveillance program. It is not intended to diagnose infection nor to guide or monitor treatment. Performed at [facility], [address] [place], [place] [address]   Culture, blood (routine x 2)     Status: None (Preliminary result)   Collection Time: 12/01/18  7:43 PM   Specimen: BLOOD  Result Value Ref Range Status   Specimen Description BLOOD LEFT ARM  Final   Special Requests   Final    BOTTLES DRAWN AEROBIC AND ANAEROBIC Blood Culture adequate volume   Culture   Final    NO GROWTH 3 DAYS Performed at [facility], [address], [place], [place] [address]    Report Status PENDING  Incomplete  Aerobic Culture (superficial specimen)     Status: None   Collection Time: 12/02/18 12:32 AM   Specimen: Foot; Wound  Result Value Ref Range Status   Specimen Description   Final    FOOT RIGHT Performed at [facility], [address] [place], [place] [address]    Special Requests   Final    Normal Performed at [name], [address] [place], [place] [address]    Gram Stain   Final    RARE WBC PRESENT, PREDOMINANTLY PMN MODERATE GRAM POSITIVE COCCI Performed at [facility], [address] [place], [place] [address]    Culture   Final    ABUNDANT METHICILLIN RESISTANT STAPHYLOCOCCUS AUREUS   Report Status 12/04/2018 FINAL  Final   Organism ID, Bacteria METHICILLIN RESISTANT STAPHYLOCOCCUS AUREUS  Final      Susceptibility   Methicillin resistant staphylococcus aureus - MIC*    CIPROFLOXACIN >=8 RESISTANT Resistant     ERYTHROMYCIN >=8 RESISTANT Resistant     GENTAMICIN <=0.5 SENSITIVE Sensitive     OXACILLIN >=4 RESISTANT Resistant     TETRACYCLINE <=1 SENSITIVE Sensitive     VANCOMYCIN 1 SENSITIVE Sensitive     TRIMETH/SULFA <=10 SENSITIVE Sensitive     CLINDAMYCIN >=8 RESISTANT Resistant     RIFAMPIN <=0.5 SENSITIVE Sensitive     Inducible Clindamycin NEGATIVE Sensitive     * ABUNDANT METHICILLIN RESISTANT STAPHYLOCOCCUS AUREUS  Aerobic/Anaerobic Culture (surgical/deep wound)     Status: None (Preliminary result)   Collection Time: 12/03/18 12:29 PM   Specimen: Wound  Result Value Ref Range Status   Specimen Description   Final    WOUND RIGHT FOOT Performed at [facility], [address], [place], [place] [address]    Special Requests   Final    NONE Performed at [facility], [place], [place] [address]    Gram Stain   Final    ABUNDANT WBC PRESENT,BOTH PMN AND MONONUCLEAR FEW GRAM POSITIVE COCCI Performed at [facility], [place] [address], [place], [place] [address]    Culture MODERATE STAPHYLOCOCCUS AUREUS  Final   Report Status PENDING  Incomplete     Studies: Dg Foot 2 Views Right  Result Date: 12/03/2018 CLINICAL DATA:  Status post forefoot amputation. EXAM: RIGHT FOOT - 2 VIEW COMPARISON:  08/20/2017 FINDINGS: The patient is status post forefoot amputation at the level of the proximal metatarsal bones. Overlying soft tissue swelling is identified and there is gas within the adjacent soft tissues, likely postop. IMPRESSION: 1. Status post forefoot amputation at the level of the proximal metatarsal bones. Gas is identified within the overlying soft tissues, nonspecific. No focal bone erosions noted. Electronically Signed   By: Kerby Moors M.D.   On: 12/03/2018 14:37    Scheduled Meds: . amLODipine  5 mg Oral Daily  . atorvastatin  40 mg Oral q1800  . carvedilol  12.5 mg Oral BID WC  . Chlorhexidine Gluconate Cloth  6 each Topical Q0600  . DULoxetine  60 mg Oral BID  . insulin aspart  0-15 Units Subcutaneous TID WC  . insulin aspart  0-5 Units Subcutaneous QHS  . levothyroxine  75 mcg Oral Q0600  . morphine  30 mg Oral Q12H  . multivitamin with minerals  1 tablet Oral Daily  . mupirocin ointment  1 application Nasal BID  . naloxegol oxalate  25 mg Oral Daily  . pregabalin  75 mg Oral TID  . tamsulosin  0.4 mg Oral Daily   Continuous Infusions: . sodium chloride 75 mL/hr at 12/04/18 0637  . vancomycin 1,250 mg (12/04/18 1042)    Assessment/Plan:  1. Osteomyelitis and right foot diabetic ulceration.  Patient went to the OR yesterday for trans-metatarsal amputation.  Continue aggressive antibiotics with vancomycin and cefepime.  MRSA growing out of the initial culture.  Follow-up operative cultures.  Pain control with IV and oral medications. 2. Hypothyroidism on Synthroid 3. Chronic pain syndrome on MS Contin and oxycodone 4. Diabetic neuropathy on Lyrica 5. Hypertension on Coreg and  Norvasc 6. BPH on Flomax 7. Hyperlipidemia on atorvastatin 8. Impaired fasting glucose.  Hemoglobin A1c 5.5.  Patient not a diabetic currently.  Although some sugars here in the 200s.  Continue sliding scale for now.  Code Status:     Code Status Orders  (From admission, onward)         Start     Ordered   12/01/18 1833  Full code  Continuous     12/01/18 1832        Code Status History    Date Active Date Inactive Code Status Order ID Comments User Context   10/28/2018 1212 10/28/2018 1548 Full Code [id number]  Samara Deist, [facility] Inpatient   08/20/2017 1530 08/25/2017 2002 Full Code [id number]  Epifanio Lesches, MD ED   02/19/2017 1838 02/26/2017 1359 Full Code [id number]  Max Sane, MD Inpatient   02/17/2017 1711 02/17/2017 2034 Full Code [id number]  Samara Deist, DPM Inpatient   02/10/2017 1516 02/10/2017 1856 Full Code [id number]  Samara Deist, [place] Inpatient   11/17/2016 1515 11/19/2016 1437 Full Code [id number]  Bettey Costa, MD Inpatient   07/10/2015 0500 07/11/2015 1714 Full Code [id number]  Lance Coon, MD Inpatient   06/27/2015 1359 06/28/2015 1213 Full Code [id number]  Yolonda Kida, MD Inpatient   06/25/2015 2010 06/27/2015 1359 Full Code [id number]  Vaughan Basta, MD Inpatient   Advance Care Planning Activity    Advance Directive Documentation     Most Recent Value  Type of Advance Directive  Healthcare Power of Attorney, Living will  Pre-existing out of facility DNR order (yellow form or pink MOST form)  -  "MOST" Form in Place?  -  Disposition Plan: To be determined  Consultants:  Podiatry  Procedures:  Trans-met amputation  Antibiotics:  Vancomycin  Cefepime  Time spent: 27 minutes.  Case discussed with podiatry.  Patient deferred me calling any family members.  Ercie Eliasen [employer]
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
## 2018-12-04 NOTE — Anesthesia Postprocedure Evaluation (Signed)
Anesthesia Post Note  Patient: AASHRITH EVES  Procedure(s) Performed: TRANSMETATARSAL AMPUTATION RIGHT FOOT (Right ) ACHILLES LENGTHENING/KIDNER (Right )  Patient location during evaluation: PACU Anesthesia Type: General Level of consciousness: awake and alert Pain management: pain level controlled Vital Signs Assessment: post-procedure vital signs reviewed and stable Respiratory status: spontaneous breathing, nonlabored ventilation and respiratory function stable Cardiovascular status: blood pressure returned to baseline and stable Postop Assessment: no apparent nausea or vomiting Anesthetic complications: no     Last Vitals:  Vitals:   12/03/18 2359 12/04/18 0345  BP: 132/75 126/78  Pulse: 64 (!) 49  Resp:    Temp: 36.5 C 36.6 C  SpO2: 98% 96%    Last Pain:  Vitals:   12/04/18 0345  TempSrc: Oral  PainSc:                  Alphonsus Sias

## 2018-12-04 NOTE — Progress Notes (Signed)
Pharmacy Antibiotic Note  Charles [name] is a 58 y.o. male admitted on 12/01/2018 with worsening right foot ulcer with osteomyelitis. Pharmacy has been consulted for cefepime dosing.  Plan: Zosyn discontinued. Cefepime 2 g IV q8h to start at next scheduled Zosyn dose (2200)  Continue vancomycin 1250mg  IV Q12hrs.     Height: 6\' 1"  (185.4 cm) Weight: 280 lb 3.2 oz (127.1 kg) IBW/kg (Calculated) : 79.9  Temp (24hrs), Avg:97.9 F (36.6 C), Min:97.7 F (36.5 C), Max:98.2 F (36.8 C)  Recent Labs  Lab 12/01/18 1851 12/02/18 0409 12/03/18 0424 12/04/18 0411  WBC 13.6* 11.4* 8.3  --   CREATININE 0.86 0.75 0.77 0.70    Estimated Creatinine Clearance: 140.7 mL/min (by C-G formula based on SCr of 0.7 mg/dL).    Allergies  Allergen Reactions  . Lisinopril Cough    Antimicrobials this admission: Vancomycin 8/13 >> Zosyn 8/13 >> 8/16 Cefepime 8/16 >>  Dose adjustments this admission: NA  Microbiology results: 8/13 BCx: no growth x 3 days  8/13 MRSA PCR: positive 8/14 Foot cx: MRSA 8/13 COVID: negative   Thank you for allowing pharmacy to be a part of this patient's care.  Tawnya Crook, PharmD 12/04/2018 1:52 PM

## 2018-12-05 LAB — BASIC METABOLIC PANEL
Anion gap: 5 (ref 5–15)
BUN: 13 mg/dL (ref 6–20)
CO2: 26 mmol/L (ref 22–32)
Calcium: 8.4 mg/dL — ABNORMAL LOW (ref 8.9–10.3)
Chloride: 109 mmol/L (ref 98–111)
Creatinine, Ser: 0.78 mg/dL (ref 0.61–1.24)
GFR calc Af Amer: >60 mL/min (ref >60)
GFR calc non Af Amer: >60 mL/min (ref >60)
Glucose, Bld: 149 mg/dL — ABNORMAL HIGH (ref 70–99)
Potassium: 3.5 mmol/L (ref 3.5–5.1)
Sodium: 140 mmol/L (ref 135–145)

## 2018-12-05 LAB — CBC
HCT: 34 % — ABNORMAL LOW (ref 39.0–52.0)
Hemoglobin: 10.6 g/dL — ABNORMAL LOW (ref 13.0–17.0)
MCH: 28.4 pg (ref 26.0–34.0)
MCHC: 31.2 g/dL (ref 30.0–36.0)
MCV: 91.2 fL (ref 80.0–100.0)
Platelets: 304 10*3/uL (ref 150–400)
RBC: 3.73 MIL/uL — ABNORMAL LOW (ref 4.22–5.81)
RDW: 14.6 % (ref 11.5–15.5)
WBC: 8.8 10*3/uL (ref 4.0–10.5)
nRBC: 0 % (ref 0.0–0.2)

## 2018-12-05 LAB — GLUCOSE, CAPILLARY
Glucose-Capillary: 82 mg/dL (ref 70–99)
Glucose-Capillary: 139 mg/dL — ABNORMAL HIGH (ref 70–99)

## 2018-12-05 MED ORDER — CEFDINIR 300 MG PO CAPS: 300.0000 mg | ORAL_CAPSULE | Freq: Two times a day (BID) | ORAL | 0 refills | Status: DC

## 2018-12-05 MED ORDER — MUPIROCIN 2 % EX OINT: 1.0000 "application " | TOPICAL_OINTMENT | Freq: Two times a day (BID) | CUTANEOUS | 0 refills | Status: DC

## 2018-12-05 MED ORDER — ENOXAPARIN SODIUM 40 MG/0.4ML ~~LOC~~ SOLN
40.0000 mg | SUBCUTANEOUS | Status: DC
  Administered 2018-12-05: 13:00:00 40 mg via SUBCUTANEOUS
  Filled 2018-12-05: qty 0.4

## 2018-12-05 MED ORDER — POTASSIUM CHLORIDE CRYS ER 20 MEQ PO TBCR
40.0000 meq | EXTENDED_RELEASE_TABLET | Freq: Once | ORAL | Status: AC
  Administered 2018-12-05: 40 meq via ORAL
  Filled 2018-12-05: qty 2

## 2018-12-05 MED ORDER — CEFDINIR 300 MG PO CAPS
300.0000 mg | ORAL_CAPSULE | Freq: Two times a day (BID) | ORAL | Status: DC
  Filled 2018-12-05: qty 1

## 2018-12-05 MED ORDER — VANCOMYCIN HCL 1.5 G IV SOLR
1500.0000 mg | Freq: Two times a day (BID) | INTRAVENOUS | Status: DC
  Administered 2018-12-05: 1500 mg via INTRAVENOUS
  Filled 2018-12-05 (×3): qty 1500

## 2018-12-05 MED ORDER — DOXYCYCLINE HYCLATE 100 MG PO TABS: 100.0000 mg | ORAL_TABLET | Freq: Two times a day (BID) | ORAL | 0 refills | Status: DC

## 2018-12-05 MED ORDER — ENOXAPARIN SODIUM 40 MG/0.4ML ~~LOC~~ SOLN: SUBCUTANEOUS | 0 refills | Status: DC

## 2018-12-05 MED ORDER — DOXYCYCLINE HYCLATE 100 MG PO TABS
100.0000 mg | ORAL_TABLET | Freq: Two times a day (BID) | ORAL | Status: DC
  Administered 2018-12-05: 100 mg via ORAL
  Filled 2018-12-05: qty 1

## 2018-12-05 MED ORDER — POLYETHYLENE GLYCOL 3350 17 G PO PACK: 17.0000 g | PACK | Freq: Every day | ORAL | 0 refills | Status: DC | PRN

## 2018-12-05 NOTE — Evaluation (Signed)
Physical Therapy Evaluation Patient Details Name: Charles [name] MRN: [id number] DOB: 08/27/1960 Today's Date: 12/05/2018   History of Present Illness  Pt admitted for nonhealing wound secondary to osteomyelitis of 2nd and 3rd MTP. Pt is now s/p trans-met amputation. Other PMH includes multiple surgeries to B LE, anxiety, GERD, and depression.  Clinical Impression  Pt is a pleasant 58 year old male who was admitted for trans met amputation. Pt performs bed mobility with independence, transfers with mod I, and ambulation with supervision and RW. Pt demonstrates deficits with endurance/pain. Would benefit from skilled PT to address above deficits and promote optimal return to PLOF. No follow up required, will just plan to follow during acute care stay to provide regular mobility and prevent hospital acquired weakness.    Follow Up Recommendations No PT follow up    Equipment Recommendations  None recommended by PT    Recommendations for Other Services       Precautions / Restrictions Precautions Precautions: Fall Restrictions Weight Bearing Restrictions: Yes RLE Weight Bearing: Non weight bearing      Mobility  Bed Mobility Overal bed mobility: Independent             General bed mobility comments: safe technique with no cues required  Transfers Overall transfer level: Modified independent Equipment used: Rolling walker (2 wheeled)             General transfer comment: safe technique, AD adjusted prior to mobility attempts. Able to maintain correct WBing status  Ambulation/Gait Ambulation/Gait assistance: Supervision Gait Distance (Feet): 40 Feet Assistive device: Rolling walker (2 wheeled) Gait Pattern/deviations: Step-to pattern     General Gait Details: able to hop and maintain correct WBing status using RW. Safe technique. Reports pain with mobility  Stairs            Wheelchair Mobility    Modified Rankin (Stroke Patients Only)        Balance Overall balance assessment: No apparent balance deficits (not formally assessed)                                           Pertinent Vitals/Pain Pain Assessment: Faces Faces Pain Scale: Hurts little more Pain Location: R LE Pain Descriptors / Indicators: Operative site guarding Pain Intervention(s): Limited activity within patient's tolerance;Premedicated before session;Repositioned    Home Living Family/patient expects to be discharged to:: Private residence Living Arrangements: Spouse/significant other Available Help at Discharge: Family;Available 24 hours/day Type of Home: House Home Access: Stairs to enter Entrance Stairs-Rails: None Entrance Stairs-Number of Steps: 2 Home Layout: One level Home Equipment: Walker - 2 wheels;Crutches;Bedside commode      Prior Function Level of Independence: Independent         Comments: indep prior. very familiar with AD and use      Hand Dominance        Extremity/Trunk Assessment   Upper Extremity Assessment Upper Extremity Assessment: Overall WFL for tasks assessed    Lower Extremity Assessment Lower Extremity Assessment: Overall WFL for tasks assessed(R LE not formally assessed, atleast 3/5)       Communication   Communication: No difficulties  Cognition Arousal/Alertness: Awake/alert Behavior During Therapy: WFL for tasks assessed/performed Overall Cognitive Status: Within Functional Limits for tasks assessed                                          General Comments      Exercises     Assessment/Plan    PT Assessment Patient needs continued PT services  PT Problem List Decreased strength;Decreased mobility;Pain       PT Treatment Interventions Gait training;Stair training;Therapeutic exercise    PT Goals (Current goals can be found in the Care Plan section)  Acute Rehab PT Goals Patient Stated Goal: to go home PT Goal Formulation: With patient Time For Goal  Achievement: 12/19/18 Potential to Achieve Goals: Good    Frequency Min 2X/week   Barriers to discharge        Co-evaluation               AM-PAC PT "6 Clicks" Mobility  Outcome Measure Help needed turning from your back to your side while in a flat bed without using bedrails?: None Help needed moving from lying on your back to sitting on the side of a flat bed without using bedrails?: None Help needed moving to and from a bed to a chair (including a wheelchair)?: None Help needed standing up from a chair using your arms (e.g., wheelchair or bedside chair)?: None Help needed to walk in hospital room?: None Help needed climbing 3-5 steps with a railing? : A Little 6 Click Score: 23    End of Session   Activity Tolerance: Patient tolerated treatment well Patient left: in bed(with leg elevated on 4 pillows) Nurse Communication: Mobility status PT Visit Diagnosis: Difficulty in walking, not elsewhere classified (R26.2);Pain Pain - Right/Left: Right Pain - part of body: Ankle and joints of foot    Time: 0920-0932 PT Time Calculation (min) (ACUTE ONLY): 12 min   Charges:   PT Evaluation $PT Eval Low Complexity: 1 Low          Greggory Stallion, PT, DPT [phone]   Aleni Andrus 12/05/2018, 9:48 AM

## 2018-12-05 NOTE — Progress Notes (Signed)
Patient is alert and oriented. VSS. PIV removed. Printed AVS given to patient in discharge packet. All instructions gone over at this time. No concerns voiced. Daughter at bedside. All belongings packed. Pt escorted to car via wc.  Bethann Punches, RN

## 2018-12-05 NOTE — Progress Notes (Signed)
OT Cancellation Note  Patient Details Name: Charles [name] MRN: [id number] DOB: 1961-04-04   Cancelled Treatment:    Reason Eval/Treat Not Completed: OT screened, no needs identified, will sign off. Consult received, chart reviewed. Pt ambulating with RW and maintaining R foot NWBing precautions upon OT's arrival. Pt politely declines any skilled OT needs. Reports this is "not my first rodeo" and reports he has all necessary equipment and his spouse is able to provide any assist needed once he returns home. Pt reports having bathed and dressed without assist this morning. No skilled OT needs identified. Will sign off. Please re-consult if additional needs arise. Thank you for this consult.   Jeni Salles, MPH, MS, OTR/L ascom [phone] 12/05/18, 9:55 AM

## 2018-12-05 NOTE — TOC Transition Note (Signed)
Transition of Care [facility]) - CM/SW Discharge Note   Patient Details  Name: MONROE QIN MRN: [id number] Date of Birth: [birth year]  [facility] CM/SW Contact:  Su Hilt, RN Phone Number: 12/05/2018, 12:09 PM   Clinical Narrative:    Met with the patient to discuss needs and Plan, He plans to discharge home, he has a walker and crutches and a BSC.  He will get himself a knee scooter, The patient states he has no other needs.    Final next level of care: Home/Self Care Barriers to Discharge: Barriers Resolved   Patient Goals and CMS Choice        Discharge Placement                       Discharge Plan and Services                                     Social Determinants of Health (SDOH) Interventions     Readmission Risk Interventions No flowsheet data found.

## 2018-12-05 NOTE — Progress Notes (Addendum)
Pharmacy Antibiotic Note  Charles [name] is a 58 y.o. male admitted on 12/01/2018 with worsening right foot ulcer with osteomyelitis. Pharmacy has been consulted for cefepime/vancomycin dosing.  BM >30  Plan: 1.Continue Cefepime 2 g IV q8h    2.Change vancomycin to 1500mg  IV Q12hrs. AUC goal: 400-550 Expected AUC: 477.8 Cssmin 12    Height: 6\' 1"  (185.4 cm) Weight: 280 lb 3.2 oz (127.1 kg) IBW/kg (Calculated) : 79.9  Temp (24hrs), Avg:97.8 F (36.6 C), Min:97.7 F (36.5 C), Max:98 F (36.7 C)  Recent Labs  Lab 12/01/18 1851 12/02/18 0409 12/03/18 0424 12/04/18 0411 12/05/18 0444  WBC 13.6* 11.4* 8.3  --  8.8  CREATININE 0.86 0.75 0.77 0.70 0.78    Estimated Creatinine Clearance: 140.7 mL/min (by C-G formula based on SCr of 0.78 mg/dL).    Allergies  Allergen Reactions  . Lisinopril Cough    Antimicrobials this admission: Vancomycin 8/13 >> Zosyn 8/13 >> 8/16 Cefepime 8/16 >>  Dose adjustments this admission: NA  Microbiology results: 8/13 BCx: no growth x 3 days  8/13 MRSA PCR: positive 8/14 Foot cx: MRSA 8/13 COVID: negative   Thank you for allowing pharmacy to be a part of this patient's care.  Rowland Lathe, PharmD 12/05/2018 8:50 AM

## 2018-12-05 NOTE — Discharge Summary (Signed)
[facility] [place] NAME: Charles [name]    MR#:  [id number]  DATE OF BIRTH:  09/13/1960  DATE OF ADMISSION:  12/01/2018 ADMITTING PHYSICIAN: Hillary Bow, MD  DATE OF DISCHARGE: 12/05/2018  2:15 PM  PRIMARY CARE PHYSICIAN: Lavera Guise, MD    ADMISSION DIAGNOSIS:  Nonhealing ulcer  DISCHARGE DIAGNOSIS:  Active Problems:   Nonhealing nonsurgical wound   Pressure injury of skin   SECONDARY DIAGNOSIS:   Past Medical History:  Diagnosis Date  . Anginal pain ([place])   . Anxiety   . Arthritis   . Asthma   . Chronic back pain   . Coronary artery disease   . Depression   . GERD (gastroesophageal reflux disease)   . Hypertension   . Hypothyroidism   . Insomnia   . Low testosterone   . Myocardial infarction ([place]) 2017  . Neuropathy of both feet   . Peptic ulcer   . Pneumonia   . Shortness of breath   . Sleep apnea    has cpap-has not used since lost 140lb  . Spinal cord stimulator status    has a scs  . Wears glasses     HOSPITAL COURSE:   1.  Osteomyelitis with right foot diabetic ulceration and infection.  Patient went to the operating room on 815 by Dr. Luana Shu and had a transmetatarsal amputation.  The patient was given aggressive antibiotics in the hospital with vancomycin and cefepime.  MRSA growing out of the initial culture and the operative culture.  Patient switched over to doxycycline and Omnicef upon going home.  Follow-up with Dr. Luana Shu 1 week.  Patient nonweightbearing on the right foot.  Lovenox injections prescribed to prevent DVT.  Patient does have a rolling walker at home. 2.  Hypothyroidism on Synthroid 3.  Chronic pain on MS Contin and oxycodone 4.  Diabetic neuropathy on Lyrica 5.  Hypertension on Coreg and Norvasc 6.  BPH on Flomax 7.  Hyperlipidemia on atorvastatin 8.  Impaired fasting glucose.  Hemoglobin A1c 5.5.  Patient not a diabetic currently.  DISCHARGE CONDITIONS:   Satisfactory  CONSULTS  OBTAINED:  Treatment Team:  Caroline More, DPM  DRUG ALLERGIES:   Allergies  Allergen Reactions  . Lisinopril Cough    DISCHARGE MEDICATIONS:   Allergies as of 12/05/2018      Reactions   Lisinopril Cough      Medication List    TAKE these medications   acidophilus Caps capsule Take 1 capsule by mouth daily.   albuterol 108 (90 Base) MCG/ACT inhaler Commonly known as: VENTOLIN HFA Inhale 2 puffs into the lungs every 6 (six) hours as needed for wheezing or shortness of breath.   amLODipine 5 MG tablet Commonly known as: NORVASC Take 1 tablet (5 mg total) by mouth daily.   atorvastatin 40 MG tablet Commonly known as: LIPITOR Take 1 tablet (40 mg total) by mouth daily at 6 PM.   B-12 PO Take 1,000 mcg by mouth daily.   Biotin 5000 5 MG Caps Generic drug: Biotin Take 5 mg by mouth daily.   busPIRone 30 MG tablet Commonly known as: BUSPAR Take 1 tablet (30 mg total) by mouth 3 (three) times daily as needed.   carvedilol 12.5 MG tablet Commonly known as: COREG Take 12.5 mg by mouth 2 (two) times daily with a meal.   cefdinir 300 MG capsule Commonly known as: OMNICEF Take 1 capsule (300 mg total) by mouth every 12 (twelve)  hours.   Co Q-10 200 MG Caps Take 200 mg by mouth daily.   doxycycline 100 MG tablet Commonly known as: VIBRA-TABS Take 1 tablet (100 mg total) by mouth every 12 (twelve) hours.   DULoxetine 60 MG capsule Commonly known as: CYMBALTA Take 1 capsule (60 mg total) by mouth 2 (two) times daily.   enoxaparin 40 MG/0.4ML injection Commonly known as: LOVENOX Lovenox 40mg /0.4 ml prefilled syringes inject subcutaneous daily until weight bearing   levothyroxine 75 MCG tablet Commonly known as: SYNTHROID Take 1 tablet (75 mcg total) by mouth daily before breakfast.   lidocaine 5 % Commonly known as: LIDODERM Place 2 patches onto the skin daily. Remove & Discard patch within 12 hours or as directed by MD   Morphine Sulfate ER 30 MG  T12a Take 30 mg by mouth every 12 (twelve) hours.   multivitamin with minerals Tabs tablet Take 1 tablet by mouth daily.   mupirocin ointment 2 % Commonly known as: BACTROBAN Place 1 application into the nose 2 (two) times daily.   naloxegol oxalate 25 MG Tabs tablet Commonly known as: Movantik Take 1 tablet (25 mg total) by mouth daily.   ondansetron 8 MG tablet Commonly known as: ZOFRAN TAKE ONE TABLET THREE TIMES A DAY AS NEEDED FOR NAUSEA What changed:   how much to take  how to take this  when to take this  additional instructions   Oxycodone HCl 10 MG Tabs Take 10 mg by mouth every 4 (four) hours.   oxyCODONE-acetaminophen 5-325 MG tablet Commonly known as: Percocet Take 1-2 tablets by mouth every 6 (six) hours as needed for severe pain.   polyethylene glycol 17 g packet Commonly known as: MIRALAX / GLYCOLAX Take 17 g by mouth daily as needed for mild constipation.   pregabalin 75 MG capsule Commonly known as: Lyrica Take 1 capsule (75 mg total) by mouth 3 (three) times daily.   SAMBUCUS ELDERBERRY PO Take 2 tablets by mouth daily.   sildenafil 20 MG tablet Commonly known as: REVATIO Take 20 mg by mouth daily as needed.   tamsulosin 0.4 MG Caps capsule Commonly known as: FLOMAX Take 1 capsule (0.4 mg total) by mouth daily.   Vitamin D 125 MCG (5000 UT) Caps Take 5,000 Units by mouth daily.   zolpidem 10 MG tablet Commonly known as: AMBIEN TAKE 1 TABLET AT BEDTIME        DISCHARGE INSTRUCTIONS:   Follow-up PMD 5 days Follow-up 1 week podiatry  If you experience worsening of your admission symptoms, develop shortness of breath, life threatening emergency, suicidal or homicidal thoughts you must seek medical attention immediately by calling 911 or calling your MD immediately  if symptoms less severe.  You Must read complete instructions/literature along with all the possible adverse reactions/side effects for all the Medicines you take and  that have been prescribed to you. Take any new Medicines after you have completely understood and accept all the possible adverse reactions/side effects.   Please note  You were cared for by a hospitalist during your hospital stay. If you have any questions about your discharge medications or the care you received while you were in the hospital after you are discharged, you can call the unit and asked to speak with the hospitalist on call if the hospitalist that took care of you is not available. Once you are discharged, your primary care physician will handle any further medical issues. Please note that NO REFILLS for any discharge medications will be  authorized once you are discharged, as it is imperative that you return to your primary care physician (or establish a relationship with a primary care physician if you do not have one) for your aftercare needs so that they can reassess your need for medications and monitor your lab values.    Today   CHIEF COMPLAINT:  No chief complaint on file.   HISTORY OF PRESENT ILLNESS:  Charles [name]  is a 58 y.o. male came in with nonhealing ulcer.   VITAL SIGNS:  Blood pressure (!) 144/88, pulse (!) 51, temperature 97.8 F (36.6 C), resp. rate 18, height 6\' 1"  (1.854 m), weight 127.1 kg, SpO2 98 %.   PHYSICAL EXAMINATION:  GENERAL:  58 y.o.-year-old patient lying in the bed with no acute distress.  EYES: Pupils equal, round, reactive to light and accommodation. No scleral icterus. Extraocular muscles intact.  HEENT: Head atraumatic, normocephalic. Oropharynx and nasopharynx clear.  NECK:  Supple, no jugular venous distention. No thyroid enlargement, no tenderness.  LUNGS: Normal breath sounds bilaterally, no wheezing, rales,rhonchi or crepitation. No use of accessory muscles of respiration.  CARDIOVASCULAR: S1, S2 normal. No murmurs, rubs, or gallops.  ABDOMEN: Soft, non-tender, non-distended. Bowel sounds present. No organomegaly or mass.   EXTREMITIES: No pedal edema, cyanosis, or clubbing.  NEUROLOGIC: Cranial nerves II through XII are intact. Muscle strength 5/5 in all extremities. Sensation intact. Gait not checked.  PSYCHIATRIC: The patient is alert and oriented x 3.  SKIN: Right foot covered  DATA REVIEW:   CBC Recent Labs  Lab 12/05/18 0444  WBC 8.8  HGB 10.6*  HCT 34.0*  PLT 304    Chemistries  Recent Labs  Lab 12/01/18 1851  12/05/18 0444  NA 135   < > 140  K 3.5   < > 3.5  CL 101   < > 109  CO2 26   < > 26  GLUCOSE 124*   < > 149*  BUN 13   < > 13  CREATININE 0.86   < > 0.78  CALCIUM 8.6*   < > 8.4*  MG 1.9  --   --   AST 16  --   --   ALT 15  --   --   ALKPHOS 97  --   --   BILITOT 0.8  --   --    < > = values in this interval not displayed.    Microbiology Results  Results for orders placed or performed during the hospital encounter of 12/01/18  Culture, blood (routine x 2)     Status: None (Preliminary result)   Collection Time: 12/01/18  6:51 PM   Specimen: BLOOD  Result Value Ref Range Status   Specimen Description BLOOD LEFT ASSIST CONTROL  Final   Special Requests   Final    BOTTLES DRAWN AEROBIC AND ANAEROBIC Blood Culture results may not be optimal due to an inadequate volume of blood received in culture bottles   Culture   Final    NO GROWTH 4 DAYS Performed at [facility], [address], [place], [place] [address]    Report Status PENDING  Incomplete  SARS Coronavirus 2 [facility] order, Performed in [facility] hospital lab) Nasopharyngeal Nasopharyngeal Swab     Status: None   Collection Time: 12/01/18  7:37 PM   Specimen: Nasopharyngeal Swab  Result Value Ref Range Status   SARS Coronavirus 2 NEGATIVE NEGATIVE Final    Comment: (NOTE) If result is NEGATIVE SARS-CoV-2 target nucleic acids  are NOT DETECTED. The SARS-CoV-2 RNA is generally detectable in upper and lower  respiratory specimens during the acute phase of infection. The lowest  concentration of  SARS-CoV-2 viral copies this assay can detect is 250  copies / mL. A negative result does not preclude SARS-CoV-2 infection  and should not be used as the sole basis for treatment or other  patient management decisions.  A negative result may occur with  improper specimen collection / handling, submission of specimen other  than nasopharyngeal swab, presence of viral mutation(s) within the  areas targeted by this assay, and inadequate number of viral copies  (<250 copies / mL). A negative result must be combined with clinical  observations, patient history, and epidemiological information. If result is POSITIVE SARS-CoV-2 target nucleic acids are DETECTED. The SARS-CoV-2 RNA is generally detectable in upper and lower  respiratory specimens dur ing the acute phase of infection.  Positive  results are indicative of active infection with SARS-CoV-2.  Clinical  correlation with patient history and other diagnostic information is  necessary to determine patient infection status.  Positive results do  not rule out bacterial infection or co-infection with other viruses. If result is PRESUMPTIVE POSTIVE SARS-CoV-2 nucleic acids MAY BE PRESENT.   A presumptive positive result was obtained on the submitted specimen  and confirmed on repeat testing.  While 2019 novel coronavirus  (SARS-CoV-2) nucleic acids may be present in the submitted sample  additional confirmatory testing may be necessary for epidemiological  and / or clinical management purposes  to differentiate between  SARS-CoV-2 and other Sarbecovirus currently known to infect humans.  If clinically indicated additional testing with an alternate test  methodology [phone]) is advised. The SARS-CoV-2 RNA is generally  detectable in upper and lower respiratory sp ecimens during the acute  phase of infection. The expected result is Negative. Fact Sheet for Patients:  StrictlyIdeas.no Fact Sheet for Healthcare  Providers: BankingDealers.co.za This test is not yet approved or cleared by the [place] FDA and has been authorized for detection and/or diagnosis of SARS-CoV-2 by FDA under an Emergency Use Authorization (EUA).  This EUA will remain in effect (meaning this test can be used) for the duration of the COVID-19 declaration under Section 564(b)(1) of the Act, 21 U.S.C. section 360bbb-3(b)(1), unless the authorization is terminated or revoked sooner. Performed at [facility], [address] [place], [place] [address]   Surgical pcr screen     Status: Abnormal   Collection Time: 12/01/18  7:37 PM   Specimen: Nasal Mucosa; Nasal Swab  Result Value Ref Range Status   MRSA, PCR POSITIVE (A) NEGATIVE Final    Comment: CRITICAL RESULT CALLED TO, READ BACK BY AND VERIFIED WITH: Florence Canner RN AT 2237 ON 12/01/2018 SNG    Staphylococcus aureus POSITIVE (A) NEGATIVE Final    Comment: (NOTE) The Xpert SA Assay (FDA approved for NASAL specimens in patients 82 years of age and older), is one component of a comprehensive surveillance program. It is not intended to diagnose infection nor to guide or monitor treatment. Performed at [facility], [address] [place], [place] [address]   Culture, blood (routine x 2)     Status: None (Preliminary result)   Collection Time: 12/01/18  7:43 PM   Specimen: BLOOD  Result Value Ref Range Status   Specimen Description BLOOD LEFT ARM  Final   Special Requests   Final    BOTTLES DRAWN AEROBIC AND ANAEROBIC Blood Culture adequate volume  Culture   Final    NO GROWTH 4 DAYS Performed at [facility], [address] [place], [place] [address]    Report Status PENDING  Incomplete  Aerobic Culture (superficial specimen)     Status: None   Collection Time: 12/02/18 12:32 AM   Specimen: Foot; Wound  Result Value Ref Range Status   Specimen Description   Final    FOOT RIGHT Performed at  [facility], [address], [place], [place] [address]    Special Requests   Final    Normal Performed at [facility], [address] [place], [place] [address]    Gram Stain   Final    RARE WBC PRESENT, PREDOMINANTLY PMN MODERATE GRAM POSITIVE COCCI Performed at [facility], [place] [address], [place], [place] [address]    Culture   Final    ABUNDANT METHICILLIN RESISTANT STAPHYLOCOCCUS AUREUS   Report Status 12/04/2018 FINAL  Final   Organism ID, Bacteria METHICILLIN RESISTANT STAPHYLOCOCCUS AUREUS  Final      Susceptibility   Methicillin resistant staphylococcus aureus - MIC*    CIPROFLOXACIN >=8 RESISTANT Resistant     ERYTHROMYCIN >=8 RESISTANT Resistant     GENTAMICIN <=0.5 SENSITIVE Sensitive     OXACILLIN >=4 RESISTANT Resistant     TETRACYCLINE <=1 SENSITIVE Sensitive     VANCOMYCIN 1 SENSITIVE Sensitive     TRIMETH/SULFA <=10 SENSITIVE Sensitive     CLINDAMYCIN >=8 RESISTANT Resistant     RIFAMPIN <=0.5 SENSITIVE Sensitive     Inducible Clindamycin NEGATIVE Sensitive     * ABUNDANT METHICILLIN RESISTANT STAPHYLOCOCCUS AUREUS  Aerobic/Anaerobic Culture (surgical/deep wound)     Status: None (Preliminary result)   Collection Time: 12/03/18 12:29 PM   Specimen: Wound  Result Value Ref Range Status   Specimen Description   Final    WOUND RIGHT FOOT Performed at [facility], [address], [place], [place] [address]    Special Requests   Final    NONE Performed at [facility], [place], [place] [address]    Gram Stain   Final    ABUNDANT WBC PRESENT,BOTH PMN AND MONONUCLEAR FEW GRAM POSITIVE COCCI Performed at [facility], [place] [address], [place], [place] [address]    Culture   Final    MODERATE METHICILLIN RESISTANT STAPHYLOCOCCUS AUREUS NO ANAEROBES ISOLATED; CULTURE IN PROGRESS FOR 5 DAYS    Report Status PENDING  Incomplete   Organism ID, Bacteria METHICILLIN RESISTANT STAPHYLOCOCCUS AUREUS   Final      Susceptibility   Methicillin resistant staphylococcus aureus - MIC*    CIPROFLOXACIN >=8 RESISTANT Resistant     ERYTHROMYCIN >=8 RESISTANT Resistant     GENTAMICIN <=0.5 SENSITIVE Sensitive     OXACILLIN >=4 RESISTANT Resistant     TETRACYCLINE <=1 SENSITIVE Sensitive     VANCOMYCIN <=0.5 SENSITIVE Sensitive     TRIMETH/SULFA <=10 SENSITIVE Sensitive     CLINDAMYCIN >=8 RESISTANT Resistant     RIFAMPIN <=0.5 SENSITIVE Sensitive     Inducible Clindamycin NEGATIVE Sensitive     * MODERATE METHICILLIN RESISTANT STAPHYLOCOCCUS AUREUS     Management plans discussed with the patient, and he in agreement.  CODE STATUS:     Code Status Orders  (From admission, onward)         Start     Ordered   12/01/18 1833  Full code  Continuous     12/01/18 1832  Code Status History    Date Active Date Inactive Code Status Order ID Comments User Context   10/28/2018 1212 10/28/2018 1548 Full Code [id number]  Samara Deist, [facility] Inpatient   08/20/2017 1530 08/25/2017 2002 Full Code [id number]  Epifanio Lesches, MD ED   02/19/2017 1838 02/26/2017 1359 Full Code [id number]  Max Sane, MD Inpatient   02/17/2017 1711 02/17/2017 2034 Full Code [id number]  Samara Deist, DPM Inpatient   02/10/2017 1516 02/10/2017 1856 Full Code [id number]  Samara Deist, [place] Inpatient   11/17/2016 1515 11/19/2016 1437 Full Code [id number]  Bettey Costa, MD Inpatient   07/10/2015 0500 07/11/2015 1714 Full Code [id number]  Lance Coon, MD Inpatient   06/27/2015 1359 06/28/2015 1213 Full Code [id number]  Yolonda Kida, MD Inpatient   06/25/2015 2010 06/27/2015 1359 Full Code [id number]  Vaughan Basta, MD Inpatient   Advance Care Planning Activity    Advance Directive Documentation     Most Recent Value  Type of Advance Directive  Healthcare Power of Attorney, Living will  Pre-existing out of facility DNR order (yellow form or pink MOST form)  -  "MOST" Form in Place?  -      TOTAL TIME TAKING CARE  OF THIS PATIENT: 35 minutes.    Loletha Grayer M.D on 12/05/2018 at 3:35 PM  Between 7am to 6pm - Pager - [phone]  After 6pm go to [email or web address] - password [email or web address] [facility]  [phone]  CC: Primary care physician; Lavera Guise, MD
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
## 2018-12-05 NOTE — Discharge Instructions (Signed)
Podiatry discharge instructions: Remain nonweightbearing to the right lower extremity at all times. Take postop pain medication and antibiotics as prescribed if prescribed. Keep dressing clean, dry, and intact to right foot. Take appropriate DVT prophylaxis as prescribed. Make follow-up appointment within 1 week of discharge date for wound examination.

## 2018-12-06 LAB — CULTURE, BLOOD (ROUTINE X 2)
Culture: NO GROWTH
Culture: NO GROWTH
Special Requests: ADEQUATE

## 2018-12-06 LAB — SURGICAL PATHOLOGY

## 2018-12-08 ENCOUNTER — Encounter: Payer: Self-pay | Admitting: Urology

## 2018-12-09 LAB — AEROBIC/ANAEROBIC CULTURE W GRAM STAIN (SURGICAL/DEEP WOUND)

## 2018-12-12 DIAGNOSIS — M14671 Charcot's joint, right ankle and foot: Secondary | ICD-10-CM | POA: Diagnosis not present

## 2018-12-13 ENCOUNTER — Encounter: Payer: Self-pay | Admitting: Urology

## 2018-12-13 ENCOUNTER — Ambulatory Visit (INDEPENDENT_AMBULATORY_CARE_PROVIDER_SITE_OTHER): Payer: BC Managed Care – PPO | Admitting: Urology

## 2018-12-13 ENCOUNTER — Other Ambulatory Visit: Payer: Self-pay

## 2018-12-13 VITALS — BP 149/86 | HR 111 | Ht 74.0 in | Wt 265.0 lb

## 2018-12-13 DIAGNOSIS — E291 Testicular hypofunction: Secondary | ICD-10-CM | POA: Diagnosis not present

## 2018-12-13 DIAGNOSIS — N401 Enlarged prostate with lower urinary tract symptoms: Secondary | ICD-10-CM

## 2018-12-13 MED ORDER — TESTOSTERONE CYPIONATE 200 MG/ML IM SOLN: 140.0000 mg | INTRAMUSCULAR | 0 refills | Status: DC

## 2018-12-13 MED ORDER — TAMSULOSIN HCL 0.4 MG PO CAPS: 0.4000 mg | ORAL_CAPSULE | Freq: Every day | ORAL | 3 refills | Status: DC
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
## 2018-12-19 ENCOUNTER — Encounter: Payer: Self-pay | Admitting: Urology

## 2018-12-19 DIAGNOSIS — L97512 Non-pressure chronic ulcer of other part of right foot with fat layer exposed: Secondary | ICD-10-CM | POA: Diagnosis not present

## 2018-12-28 ENCOUNTER — Other Ambulatory Visit: Payer: Self-pay

## 2018-12-28 DIAGNOSIS — L97512 Non-pressure chronic ulcer of other part of right foot with fat layer exposed: Secondary | ICD-10-CM | POA: Diagnosis not present

## 2018-12-29 ENCOUNTER — Other Ambulatory Visit: Payer: Self-pay | Admitting: Nurse Practitioner

## 2018-12-29 DIAGNOSIS — E11621 Type 2 diabetes mellitus with foot ulcer: Secondary | ICD-10-CM | POA: Diagnosis not present

## 2018-12-29 DIAGNOSIS — M792 Neuralgia and neuritis, unspecified: Secondary | ICD-10-CM

## 2018-12-29 MED ORDER — PREGABALIN 75 MG PO CAPS: 75.0000 mg | ORAL_CAPSULE | Freq: Three times a day (TID) | ORAL | 3 refills | Status: DC

## 2018-12-29 NOTE — Progress Notes (Signed)
Renewed lyrica 75mg  tid per pharmacy request

## 2019-01-02 ENCOUNTER — Ambulatory Visit: Payer: Self-pay | Admitting: Nurse Practitioner

## 2019-01-02 DIAGNOSIS — L97512 Non-pressure chronic ulcer of other part of right foot with fat layer exposed: Secondary | ICD-10-CM | POA: Diagnosis not present

## 2019-01-05 ENCOUNTER — Ambulatory Visit: Payer: Self-pay | Admitting: Urology

## 2019-01-09 DIAGNOSIS — T8131XD Disruption of external operation (surgical) wound, not elsewhere classified, subsequent encounter: Secondary | ICD-10-CM | POA: Diagnosis not present

## 2019-01-09 DIAGNOSIS — L97412 Non-pressure chronic ulcer of right heel and midfoot with fat layer exposed: Secondary | ICD-10-CM | POA: Diagnosis not present

## 2019-01-09 DIAGNOSIS — E11621 Type 2 diabetes mellitus with foot ulcer: Secondary | ICD-10-CM | POA: Diagnosis not present

## 2019-01-12 ENCOUNTER — Encounter: Payer: Self-pay | Admitting: Nurse Practitioner

## 2019-01-12 ENCOUNTER — Telehealth: Payer: Self-pay

## 2019-01-12 NOTE — Telephone Encounter (Signed)
TRIED CALLING PT TO SCHEDULE AN APPT; VOICEMAIL FULL.TAT

## 2019-01-16 ENCOUNTER — Other Ambulatory Visit: Payer: Self-pay

## 2019-01-16 ENCOUNTER — Ambulatory Visit (INDEPENDENT_AMBULATORY_CARE_PROVIDER_SITE_OTHER): Payer: Self-pay | Admitting: Internal Medicine

## 2019-01-16 ENCOUNTER — Encounter: Payer: Self-pay | Admitting: Internal Medicine

## 2019-01-16 DIAGNOSIS — F5101 Primary insomnia: Secondary | ICD-10-CM

## 2019-01-16 DIAGNOSIS — I2510 Atherosclerotic heart disease of native coronary artery without angina pectoris: Secondary | ICD-10-CM | POA: Diagnosis not present

## 2019-01-16 DIAGNOSIS — E11621 Type 2 diabetes mellitus with foot ulcer: Secondary | ICD-10-CM

## 2019-01-16 DIAGNOSIS — Z1211 Encounter for screening for malignant neoplasm of colon: Secondary | ICD-10-CM

## 2019-01-16 DIAGNOSIS — M792 Neuralgia and neuritis, unspecified: Secondary | ICD-10-CM

## 2019-01-16 DIAGNOSIS — R7301 Impaired fasting glucose: Secondary | ICD-10-CM | POA: Diagnosis not present

## 2019-01-16 DIAGNOSIS — Z23 Encounter for immunization: Secondary | ICD-10-CM | POA: Diagnosis not present

## 2019-01-16 DIAGNOSIS — F329 Major depressive disorder, single episode, unspecified: Secondary | ICD-10-CM

## 2019-01-16 DIAGNOSIS — E039 Hypothyroidism, unspecified: Secondary | ICD-10-CM

## 2019-01-16 DIAGNOSIS — I2584 Coronary atherosclerosis due to calcified coronary lesion: Secondary | ICD-10-CM

## 2019-01-16 DIAGNOSIS — L97512 Non-pressure chronic ulcer of other part of right foot with fat layer exposed: Secondary | ICD-10-CM

## 2019-01-16 DIAGNOSIS — Z9884 Bariatric surgery status: Secondary | ICD-10-CM

## 2019-01-16 DIAGNOSIS — F411 Generalized anxiety disorder: Secondary | ICD-10-CM

## 2019-01-16 LAB — POCT CBG (FASTING - GLUCOSE)-MANUAL ENTRY: Glucose Fasting, POC: 120 mg/dL — AB (ref 70–99)

## 2019-01-16 MED ORDER — PREGABALIN 75 MG PO CAPS: 75.0000 mg | ORAL_CAPSULE | Freq: Three times a day (TID) | ORAL | 3 refills | Status: DC

## 2019-01-16 MED ORDER — BUSPIRONE HCL 30 MG PO TABS: 30.0000 mg | ORAL_TABLET | Freq: Three times a day (TID) | ORAL | 3 refills | Status: DC | PRN

## 2019-01-16 MED ORDER — ZOLPIDEM TARTRATE 10 MG PO TABS: 10.0000 mg | ORAL_TABLET | Freq: Every day | ORAL | 2 refills | Status: DC

## 2019-01-16 MED ORDER — DULOXETINE HCL 60 MG PO CPEP: 60.0000 mg | ORAL_CAPSULE | Freq: Two times a day (BID) | ORAL | 2 refills | Status: DC

## 2019-01-16 MED ORDER — LEVOTHYROXINE SODIUM 75 MCG PO TABS: 75.0000 ug | ORAL_TABLET | Freq: Every day | ORAL | 3 refills | Status: DC

## 2019-01-16 MED ORDER — PNEUMOCOCCAL 13-VAL CONJ VACC IM SUSP: 0.5000 mL | INTRAMUSCULAR | 0 refills | Status: AC

## 2019-01-16 MED ORDER — ONDANSETRON HCL 8 MG PO TABS: ORAL_TABLET | ORAL | 2 refills | Status: DC
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
## 2019-01-18 ENCOUNTER — Telehealth: Payer: Self-pay

## 2019-01-18 NOTE — Telephone Encounter (Signed)
Per Dr. Clayborn Bigness called pt to advise him to get his labs check for b12 and iron due to h/o of gastric bypass.

## 2019-01-26 ENCOUNTER — Encounter: Payer: Self-pay | Admitting: Adult Health

## 2019-01-26 ENCOUNTER — Ambulatory Visit (INDEPENDENT_AMBULATORY_CARE_PROVIDER_SITE_OTHER): Payer: Self-pay | Admitting: Adult Health

## 2019-01-26 ENCOUNTER — Other Ambulatory Visit: Payer: Self-pay

## 2019-01-26 VITALS — BP 130/95 | Temp 99.3°F | Ht 74.0 in | Wt 270.0 lb

## 2019-01-26 DIAGNOSIS — J0100 Acute maxillary sinusitis, unspecified: Secondary | ICD-10-CM

## 2019-01-26 DIAGNOSIS — I10 Essential (primary) hypertension: Secondary | ICD-10-CM

## 2019-01-26 MED ORDER — AMOXICILLIN-POT CLAVULANATE 875-125 MG PO TABS: 1.0000 | ORAL_TABLET | Freq: Two times a day (BID) | ORAL | 0 refills | Status: DC
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
## 2019-01-27 ENCOUNTER — Other Ambulatory Visit: Payer: BC Managed Care – PPO

## 2019-02-03 ENCOUNTER — Other Ambulatory Visit: Payer: BC Managed Care – PPO

## 2019-02-09 ENCOUNTER — Other Ambulatory Visit: Payer: Self-pay | Admitting: Internal Medicine

## 2019-02-09 DIAGNOSIS — I10 Essential (primary) hypertension: Secondary | ICD-10-CM

## 2019-02-09 MED ORDER — AMLODIPINE BESYLATE 5 MG PO TABS: 5.0000 mg | ORAL_TABLET | Freq: Every day | ORAL | 5 refills | Status: DC

## 2019-02-13 DIAGNOSIS — L84 Corns and callosities: Secondary | ICD-10-CM | POA: Diagnosis not present

## 2019-02-17 ENCOUNTER — Ambulatory Visit: Payer: Self-pay

## 2019-02-17 ENCOUNTER — Other Ambulatory Visit: Payer: Self-pay

## 2019-02-17 DIAGNOSIS — I2510 Atherosclerotic heart disease of native coronary artery without angina pectoris: Secondary | ICD-10-CM | POA: Diagnosis not present

## 2019-02-17 DIAGNOSIS — I2584 Coronary atherosclerosis due to calcified coronary lesion: Secondary | ICD-10-CM

## 2019-02-22 ENCOUNTER — Encounter: Payer: Self-pay | Admitting: Nurse Practitioner

## 2019-02-23 NOTE — Telephone Encounter (Signed)
What am I supposed to write for this?

## 2019-02-24 DIAGNOSIS — Z1211 Encounter for screening for malignant neoplasm of colon: Secondary | ICD-10-CM | POA: Diagnosis not present

## 2019-02-24 DIAGNOSIS — E782 Mixed hyperlipidemia: Secondary | ICD-10-CM | POA: Diagnosis not present

## 2019-02-24 DIAGNOSIS — K600 Acute anal fissure: Secondary | ICD-10-CM | POA: Diagnosis not present

## 2019-02-24 DIAGNOSIS — I2510 Atherosclerotic heart disease of native coronary artery without angina pectoris: Secondary | ICD-10-CM | POA: Diagnosis not present

## 2019-03-01 ENCOUNTER — Telehealth: Payer: Self-pay

## 2019-03-01 NOTE — Telephone Encounter (Signed)
lmom to call be back

## 2019-03-02 ENCOUNTER — Telehealth: Payer: Self-pay

## 2019-03-02 ENCOUNTER — Ambulatory Visit: Payer: BC Managed Care – PPO | Admitting: Nurse Practitioner

## 2019-03-02 NOTE — Telephone Encounter (Signed)
Left message and asked pt callback regarding appt today, it may need to be with his podiatry surgeon. Beth

## 2019-03-08 ENCOUNTER — Telehealth: Payer: Self-pay

## 2019-03-08 NOTE — Telephone Encounter (Signed)
TRIED CONTACTING PATIENT TO CONFIRM 03-10-19 APPT BOTH NU,BERS NO VM. UNABLE TO CONFIRM.

## 2019-03-10 ENCOUNTER — Ambulatory Visit: Payer: BC Managed Care – PPO | Admitting: Nurse Practitioner

## 2019-03-10 ENCOUNTER — Other Ambulatory Visit: Payer: Self-pay

## 2019-03-10 DIAGNOSIS — E782 Mixed hyperlipidemia: Secondary | ICD-10-CM

## 2019-03-10 MED ORDER — ATORVASTATIN CALCIUM 40 MG PO TABS: 40.0000 mg | ORAL_TABLET | Freq: Every day | ORAL | 1 refills | Status: DC

## 2019-03-13 NOTE — Progress Notes (Signed)
Will do. He missed his appontment for follow up last week.

## 2019-03-15 ENCOUNTER — Other Ambulatory Visit: Payer: Self-pay

## 2019-03-15 ENCOUNTER — Other Ambulatory Visit: Payer: BC Managed Care – PPO

## 2019-03-15 MED ORDER — CARVEDILOL 12.5 MG PO TABS: 12.5000 mg | ORAL_TABLET | Freq: Two times a day (BID) | ORAL | 0 refills | Status: DC

## 2019-03-23 ENCOUNTER — Ambulatory Visit: Payer: BC Managed Care – PPO | Admitting: Urology

## 2019-04-10 DIAGNOSIS — M19071 Primary osteoarthritis, right ankle and foot: Secondary | ICD-10-CM | POA: Diagnosis not present

## 2019-04-10 DIAGNOSIS — L851 Acquired keratosis [keratoderma] palmaris et plantaris: Secondary | ICD-10-CM | POA: Diagnosis not present

## 2019-04-10 DIAGNOSIS — M25571 Pain in right ankle and joints of right foot: Secondary | ICD-10-CM | POA: Diagnosis not present

## 2019-04-10 DIAGNOSIS — E1142 Type 2 diabetes mellitus with diabetic polyneuropathy: Secondary | ICD-10-CM | POA: Diagnosis not present

## 2019-04-10 DIAGNOSIS — L84 Corns and callosities: Secondary | ICD-10-CM | POA: Diagnosis not present

## 2019-04-20 ENCOUNTER — Other Ambulatory Visit: Payer: Self-pay

## 2019-04-20 MED ORDER — CARVEDILOL 12.5 MG PO TABS: 12.5000 mg | ORAL_TABLET | Freq: Two times a day (BID) | ORAL | 0 refills | Status: DC

## 2019-05-01 DIAGNOSIS — M79671 Pain in right foot: Secondary | ICD-10-CM | POA: Diagnosis not present

## 2019-05-01 DIAGNOSIS — L84 Corns and callosities: Secondary | ICD-10-CM | POA: Diagnosis not present

## 2019-05-01 DIAGNOSIS — M25571 Pain in right ankle and joints of right foot: Secondary | ICD-10-CM | POA: Diagnosis not present

## 2019-05-01 DIAGNOSIS — L97511 Non-pressure chronic ulcer of other part of right foot limited to breakdown of skin: Secondary | ICD-10-CM | POA: Diagnosis not present

## 2019-05-01 DIAGNOSIS — E1142 Type 2 diabetes mellitus with diabetic polyneuropathy: Secondary | ICD-10-CM | POA: Diagnosis not present

## 2019-05-01 DIAGNOSIS — L851 Acquired keratosis [keratoderma] palmaris et plantaris: Secondary | ICD-10-CM | POA: Diagnosis not present

## 2019-05-08 DIAGNOSIS — L97512 Non-pressure chronic ulcer of other part of right foot with fat layer exposed: Secondary | ICD-10-CM | POA: Diagnosis not present

## 2019-05-08 DIAGNOSIS — L97511 Non-pressure chronic ulcer of other part of right foot limited to breakdown of skin: Secondary | ICD-10-CM | POA: Diagnosis not present

## 2019-05-10 ENCOUNTER — Other Ambulatory Visit: Payer: Self-pay | Admitting: Internal Medicine

## 2019-05-10 DIAGNOSIS — F5101 Primary insomnia: Secondary | ICD-10-CM

## 2019-05-11 ENCOUNTER — Other Ambulatory Visit: Payer: Self-pay | Admitting: Internal Medicine

## 2019-05-11 DIAGNOSIS — F5101 Primary insomnia: Secondary | ICD-10-CM

## 2019-05-15 DIAGNOSIS — L97511 Non-pressure chronic ulcer of other part of right foot limited to breakdown of skin: Secondary | ICD-10-CM | POA: Diagnosis not present

## 2019-05-17 ENCOUNTER — Ambulatory Visit (INDEPENDENT_AMBULATORY_CARE_PROVIDER_SITE_OTHER): Payer: BC Managed Care – PPO | Admitting: Adult Health

## 2019-05-17 ENCOUNTER — Encounter: Payer: Self-pay | Admitting: Adult Health

## 2019-05-17 ENCOUNTER — Other Ambulatory Visit: Payer: Self-pay

## 2019-05-17 VITALS — Ht 74.0 in

## 2019-05-17 DIAGNOSIS — F329 Major depressive disorder, single episode, unspecified: Secondary | ICD-10-CM | POA: Diagnosis not present

## 2019-05-17 DIAGNOSIS — F411 Generalized anxiety disorder: Secondary | ICD-10-CM

## 2019-05-17 DIAGNOSIS — I10 Essential (primary) hypertension: Secondary | ICD-10-CM

## 2019-05-17 DIAGNOSIS — F5101 Primary insomnia: Secondary | ICD-10-CM | POA: Diagnosis not present

## 2019-05-17 DIAGNOSIS — R110 Nausea: Secondary | ICD-10-CM

## 2019-05-17 DIAGNOSIS — M792 Neuralgia and neuritis, unspecified: Secondary | ICD-10-CM

## 2019-05-17 DIAGNOSIS — E039 Hypothyroidism, unspecified: Secondary | ICD-10-CM

## 2019-05-17 DIAGNOSIS — Z0001 Encounter for general adult medical examination with abnormal findings: Secondary | ICD-10-CM

## 2019-05-17 MED ORDER — BUSPIRONE HCL 30 MG PO TABS: 30.0000 mg | ORAL_TABLET | Freq: Three times a day (TID) | ORAL | 3 refills | Status: DC | PRN

## 2019-05-17 MED ORDER — PREGABALIN 75 MG PO CAPS: 75.0000 mg | ORAL_CAPSULE | Freq: Three times a day (TID) | ORAL | 3 refills | Status: DC

## 2019-05-17 MED ORDER — ZOLPIDEM TARTRATE 10 MG PO TABS: 10.0000 mg | ORAL_TABLET | Freq: Every day | ORAL | 2 refills | Status: DC

## 2019-05-17 MED ORDER — DULOXETINE HCL 60 MG PO CPEP: 60.0000 mg | ORAL_CAPSULE | Freq: Two times a day (BID) | ORAL | 2 refills | Status: DC

## 2019-05-17 MED ORDER — ONDANSETRON HCL 8 MG PO TABS: ORAL_TABLET | ORAL | 2 refills | Status: DC

## 2019-05-17 MED ORDER — LEVOTHYROXINE SODIUM 75 MCG PO TABS: 75.0000 ug | ORAL_TABLET | Freq: Every day | ORAL | 3 refills | Status: DC
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
## 2019-05-23 ENCOUNTER — Other Ambulatory Visit: Payer: Self-pay

## 2019-05-23 MED ORDER — CARVEDILOL 12.5 MG PO TABS: 12.5000 mg | ORAL_TABLET | Freq: Two times a day (BID) | ORAL | 3 refills | Status: DC

## 2019-06-15 ENCOUNTER — Other Ambulatory Visit: Payer: BC Managed Care – PPO | Attending: Internal Medicine

## 2019-06-19 ENCOUNTER — Encounter: Admission: RE | Payer: Self-pay | Source: Ambulatory Visit

## 2019-06-19 ENCOUNTER — Ambulatory Visit
Admission: RE | Admit: 2019-06-19 | Payer: BC Managed Care – PPO | Source: Ambulatory Visit | Admitting: Internal Medicine

## 2019-06-19 SURGERY — COLONOSCOPY WITH PROPOFOL
Anesthesia: General

## 2019-07-07 ENCOUNTER — Telehealth: Payer: Self-pay

## 2019-07-07 NOTE — Telephone Encounter (Signed)
Confirmed appointment on 07/11/2019 and screened for covid. kl[facility] 

## 2019-07-11 ENCOUNTER — Ambulatory Visit (INDEPENDENT_AMBULATORY_CARE_PROVIDER_SITE_OTHER): Payer: 59 | Admitting: Adult Health

## 2019-07-11 ENCOUNTER — Other Ambulatory Visit: Payer: Self-pay

## 2019-07-11 ENCOUNTER — Encounter: Payer: Self-pay | Admitting: Adult Health

## 2019-07-11 VITALS — BP 142/90 | HR 69 | Temp 97.5°F | Resp 16 | Ht 74.0 in | Wt 272.8 lb

## 2019-07-11 DIAGNOSIS — R739 Hyperglycemia, unspecified: Secondary | ICD-10-CM

## 2019-07-11 DIAGNOSIS — Z0001 Encounter for general adult medical examination with abnormal findings: Secondary | ICD-10-CM | POA: Diagnosis not present

## 2019-07-11 DIAGNOSIS — F5101 Primary insomnia: Secondary | ICD-10-CM | POA: Diagnosis not present

## 2019-07-11 DIAGNOSIS — M792 Neuralgia and neuritis, unspecified: Secondary | ICD-10-CM

## 2019-07-11 DIAGNOSIS — I10 Essential (primary) hypertension: Secondary | ICD-10-CM

## 2019-07-11 DIAGNOSIS — R300 Dysuria: Secondary | ICD-10-CM

## 2019-07-11 DIAGNOSIS — E1165 Type 2 diabetes mellitus with hyperglycemia: Secondary | ICD-10-CM | POA: Diagnosis not present

## 2019-07-11 DIAGNOSIS — Z125 Encounter for screening for malignant neoplasm of prostate: Secondary | ICD-10-CM

## 2019-07-11 DIAGNOSIS — Z6835 Body mass index (BMI) 35.0-35.9, adult: Secondary | ICD-10-CM

## 2019-07-11 LAB — POCT GLYCOSYLATED HEMOGLOBIN (HGB A1C): Hemoglobin A1C: 5.3 % (ref 4.0–5.6)

## 2019-07-11 MED ORDER — PREGABALIN 75 MG PO CAPS: 75.0000 mg | ORAL_CAPSULE | Freq: Three times a day (TID) | ORAL | 3 refills | Status: DC

## 2019-07-11 MED ORDER — ZOLPIDEM TARTRATE 10 MG PO TABS: 10.0000 mg | ORAL_TABLET | Freq: Every day | ORAL | 2 refills | Status: DC
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
## 2019-07-12 LAB — MICROSCOPIC EXAMINATION
Bacteria, UA: NONE SEEN
RBC, Urine: >30 /hpf — AB (ref 0–2)

## 2019-07-12 LAB — UA/M W/RFLX CULTURE, ROUTINE
Bilirubin, UA: NEGATIVE
Glucose, UA: NEGATIVE
Ketones, UA: NEGATIVE
Leukocytes,UA: NEGATIVE
Nitrite, UA: NEGATIVE
Specific Gravity, UA: 1.021 (ref 1.005–1.030)
Urobilinogen, Ur: 0.2 mg/dL (ref 0.2–1.0)
pH, UA: 5 (ref 5.0–7.5)

## 2019-07-21 ENCOUNTER — Ambulatory Visit: Payer: BC Managed Care – PPO | Attending: Internal Medicine

## 2019-07-21 ENCOUNTER — Other Ambulatory Visit: Payer: Self-pay

## 2019-07-21 DIAGNOSIS — Z23 Encounter for immunization: Secondary | ICD-10-CM

## 2019-07-21 NOTE — Progress Notes (Signed)
   Covid-19 Vaccination Clinic  Name:  Charles [name]    MRN: [id number] DOB: 1961/02/23  07/21/2019  Charles [name] was observed post Covid-19 immunization for 15 minutes without incident. He was provided with Vaccine Information Sheet and instruction to access the V-Safe system.   Charles [name] was instructed to call 911 with any severe reactions post vaccine: Marland Kitchen Difficulty breathing  . Swelling of face and throat  . A fast heartbeat  . A bad rash all over body  . Dizziness and weakness   Immunizations Administered    Name Date Dose VIS Date Route   Pfizer COVID-19 Vaccine 07/21/2019  8:54 AM 0.3 mL 03/31/2019 Intramuscular   Manufacturer: [place]   Lot: [id number]   [place]: [id number]

## 2019-08-16 ENCOUNTER — Ambulatory Visit: Payer: BC Managed Care – PPO | Attending: Internal Medicine

## 2019-08-16 DIAGNOSIS — Z23 Encounter for immunization: Secondary | ICD-10-CM

## 2019-08-16 NOTE — Progress Notes (Signed)
   Covid-19 Vaccination Clinic  Name:  Charles [name]    MRN: [id number] DOB: 1960/06/09  08/16/2019  Charles [name] was observed post Covid-19 immunization for 15 minutes without incident. He was provided with Vaccine Information Sheet and instruction to access the V-Safe system.   Charles [name] was instructed to call 911 with any severe reactions post vaccine: Marland Kitchen Difficulty breathing  . Swelling of face and throat  . A fast heartbeat  . A bad rash all over body  . Dizziness and weakness   Immunizations Administered    Name Date Dose VIS Date Route   Pfizer COVID-19 Vaccine 08/16/2019  8:49 AM 0.3 mL 06/14/2018 Intramuscular   Manufacturer: [place]   Lot: [id number]   [place]: [id number]

## 2019-08-17 ENCOUNTER — Telehealth: Payer: Self-pay

## 2019-08-17 NOTE — Telephone Encounter (Signed)
Confirmed appointment on 08/21/2019 and screened for covid. klh 

## 2019-08-21 ENCOUNTER — Ambulatory Visit: Payer: BC Managed Care – PPO | Admitting: Adult Health

## 2019-08-25 ENCOUNTER — Telehealth: Payer: Self-pay

## 2019-08-25 NOTE — Telephone Encounter (Signed)
ASKED PT TO CALL REGARDING MISSED APPT

## 2019-09-08 DIAGNOSIS — F330 Major depressive disorder, recurrent, mild: Secondary | ICD-10-CM | POA: Diagnosis not present

## 2019-09-13 ENCOUNTER — Telehealth: Payer: Self-pay

## 2019-09-13 NOTE — Telephone Encounter (Signed)
Authorization was approved for MOVANTIK 23MG  COVERAGE FROM 09/11/2019 TO 09/09/2020 [facility]

## 2019-09-19 ENCOUNTER — Other Ambulatory Visit: Payer: Self-pay

## 2019-09-19 DIAGNOSIS — F411 Generalized anxiety disorder: Secondary | ICD-10-CM

## 2019-09-19 DIAGNOSIS — F329 Major depressive disorder, single episode, unspecified: Secondary | ICD-10-CM

## 2019-09-19 MED ORDER — DULOXETINE HCL 60 MG PO CPEP
60.0000 mg | ORAL_CAPSULE | Freq: Two times a day (BID) | ORAL | 3 refills | Status: DC
Start: 2019-09-19 — End: 2020-02-12

## 2019-10-10 ENCOUNTER — Other Ambulatory Visit: Payer: Self-pay | Admitting: Adult Health

## 2019-10-10 DIAGNOSIS — R110 Nausea: Secondary | ICD-10-CM

## 2019-10-25 ENCOUNTER — Other Ambulatory Visit: Payer: Self-pay | Admitting: Adult Health

## 2019-10-25 DIAGNOSIS — F5101 Primary insomnia: Secondary | ICD-10-CM

## 2019-10-26 ENCOUNTER — Other Ambulatory Visit: Payer: BC Managed Care – PPO | Attending: Internal Medicine

## 2019-10-30 ENCOUNTER — Ambulatory Visit
Admission: RE | Admit: 2019-10-30 | Payer: BC Managed Care – PPO | Source: Home / Self Care | Admitting: Internal Medicine

## 2019-10-30 ENCOUNTER — Encounter: Admission: RE | Payer: Self-pay | Source: Home / Self Care

## 2019-10-30 SURGERY — COLONOSCOPY WITH PROPOFOL
Anesthesia: General

## 2019-12-07 DIAGNOSIS — F330 Major depressive disorder, recurrent, mild: Secondary | ICD-10-CM | POA: Diagnosis not present

## 2019-12-09 ENCOUNTER — Other Ambulatory Visit: Payer: Self-pay | Admitting: Urology

## 2019-12-09 ENCOUNTER — Other Ambulatory Visit: Payer: Self-pay | Admitting: Adult Health

## 2019-12-09 DIAGNOSIS — F5101 Primary insomnia: Secondary | ICD-10-CM

## 2019-12-18 DIAGNOSIS — E113393 Type 2 diabetes mellitus with moderate nonproliferative diabetic retinopathy without macular edema, bilateral: Secondary | ICD-10-CM | POA: Diagnosis not present

## 2020-01-17 ENCOUNTER — Other Ambulatory Visit: Payer: Self-pay | Admitting: Adult Health

## 2020-01-17 DIAGNOSIS — M792 Neuralgia and neuritis, unspecified: Secondary | ICD-10-CM

## 2020-02-05 ENCOUNTER — Telehealth: Payer: Self-pay

## 2020-02-05 DIAGNOSIS — G629 Polyneuropathy, unspecified: Secondary | ICD-10-CM | POA: Diagnosis not present

## 2020-02-05 DIAGNOSIS — M2550 Pain in unspecified joint: Secondary | ICD-10-CM | POA: Diagnosis not present

## 2020-02-05 DIAGNOSIS — M791 Myalgia, unspecified site: Secondary | ICD-10-CM | POA: Diagnosis not present

## 2020-02-05 NOTE — Telephone Encounter (Signed)
Called pt to schedule Pulm appt, number is disconnested

## 2020-02-06 DIAGNOSIS — M791 Myalgia, unspecified site: Secondary | ICD-10-CM | POA: Diagnosis not present

## 2020-02-06 DIAGNOSIS — G629 Polyneuropathy, unspecified: Secondary | ICD-10-CM | POA: Diagnosis not present

## 2020-02-06 DIAGNOSIS — M2550 Pain in unspecified joint: Secondary | ICD-10-CM | POA: Diagnosis not present

## 2020-02-09 ENCOUNTER — Ambulatory Visit: Payer: 59 | Admitting: Nurse Practitioner

## 2020-02-12 ENCOUNTER — Encounter: Payer: Self-pay | Admitting: Nurse Practitioner

## 2020-02-12 ENCOUNTER — Other Ambulatory Visit: Payer: Self-pay

## 2020-02-12 ENCOUNTER — Ambulatory Visit (INDEPENDENT_AMBULATORY_CARE_PROVIDER_SITE_OTHER): Payer: 59 | Admitting: Nurse Practitioner

## 2020-02-12 VITALS — BP 130/96 | HR 73 | Temp 97.6°F | Resp 16 | Ht 74.0 in | Wt 255.2 lb

## 2020-02-12 DIAGNOSIS — F411 Generalized anxiety disorder: Secondary | ICD-10-CM | POA: Diagnosis not present

## 2020-02-12 DIAGNOSIS — E039 Hypothyroidism, unspecified: Secondary | ICD-10-CM

## 2020-02-12 DIAGNOSIS — I10 Essential (primary) hypertension: Secondary | ICD-10-CM | POA: Diagnosis not present

## 2020-02-12 DIAGNOSIS — F329 Major depressive disorder, single episode, unspecified: Secondary | ICD-10-CM

## 2020-02-12 DIAGNOSIS — R7301 Impaired fasting glucose: Secondary | ICD-10-CM | POA: Insufficient documentation

## 2020-02-12 DIAGNOSIS — F988 Other specified behavioral and emotional disorders with onset usually occurring in childhood and adolescence: Secondary | ICD-10-CM | POA: Insufficient documentation

## 2020-02-12 DIAGNOSIS — F5101 Primary insomnia: Secondary | ICD-10-CM

## 2020-02-12 LAB — POCT GLYCOSYLATED HEMOGLOBIN (HGB A1C): Hemoglobin A1C: 5.1 % (ref 4.0–5.6)

## 2020-02-12 MED ORDER — LEVOTHYROXINE SODIUM 75 MCG PO TABS: 75.0000 ug | ORAL_TABLET | Freq: Every day | ORAL | 3 refills | Status: DC

## 2020-02-12 MED ORDER — BUSPIRONE HCL 30 MG PO TABS: 30.0000 mg | ORAL_TABLET | Freq: Three times a day (TID) | ORAL | 2 refills | Status: DC | PRN

## 2020-02-12 MED ORDER — ZOLPIDEM TARTRATE 10 MG PO TABS: 10.0000 mg | ORAL_TABLET | Freq: Every day | ORAL | 1 refills | Status: DC

## 2020-02-12 MED ORDER — LISDEXAMFETAMINE DIMESYLATE 40 MG PO CAPS: 40.0000 mg | ORAL_CAPSULE | ORAL | 0 refills | Status: DC

## 2020-02-12 MED ORDER — DULOXETINE HCL 60 MG PO CPEP: 60.0000 mg | ORAL_CAPSULE | Freq: Every day | ORAL | 2 refills | Status: DC
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
## 2020-02-19 DIAGNOSIS — F1020 Alcohol dependence, uncomplicated: Secondary | ICD-10-CM | POA: Diagnosis not present

## 2020-03-07 ENCOUNTER — Other Ambulatory Visit: Payer: Self-pay | Admitting: Internal Medicine

## 2020-03-07 DIAGNOSIS — I10 Essential (primary) hypertension: Secondary | ICD-10-CM

## 2020-03-08 ENCOUNTER — Ambulatory Visit (INDEPENDENT_AMBULATORY_CARE_PROVIDER_SITE_OTHER): Payer: 59 | Admitting: Nurse Practitioner

## 2020-03-08 ENCOUNTER — Other Ambulatory Visit: Payer: Self-pay

## 2020-03-08 ENCOUNTER — Encounter: Payer: Self-pay | Admitting: Nurse Practitioner

## 2020-03-08 VITALS — BP 108/70 | HR 67 | Temp 97.8°F | Resp 16 | Ht 73.0 in | Wt 257.4 lb

## 2020-03-08 DIAGNOSIS — F329 Major depressive disorder, single episode, unspecified: Secondary | ICD-10-CM | POA: Diagnosis not present

## 2020-03-08 DIAGNOSIS — F988 Other specified behavioral and emotional disorders with onset usually occurring in childhood and adolescence: Secondary | ICD-10-CM | POA: Diagnosis not present

## 2020-03-08 DIAGNOSIS — I10 Essential (primary) hypertension: Secondary | ICD-10-CM

## 2020-03-08 DIAGNOSIS — E039 Hypothyroidism, unspecified: Secondary | ICD-10-CM | POA: Diagnosis not present

## 2020-03-08 DIAGNOSIS — F411 Generalized anxiety disorder: Secondary | ICD-10-CM

## 2020-03-08 MED ORDER — LISDEXAMFETAMINE DIMESYLATE 60 MG PO CAPS: 60.0000 mg | ORAL_CAPSULE | ORAL | 0 refills | Status: DC
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
## 2020-03-18 ENCOUNTER — Telehealth: Payer: Self-pay | Admitting: Urology

## 2020-03-18 ENCOUNTER — Other Ambulatory Visit: Payer: Self-pay | Admitting: Urology

## 2020-03-18 ENCOUNTER — Encounter: Payer: Self-pay | Admitting: *Deleted

## 2020-03-18 NOTE — Telephone Encounter (Signed)
Pharmacy is requesting a refill on his tamsulosin, but [name] has not seen Dr. Bernardo Heater in over a year.   He will need an office visit prior to refill.

## 2020-04-09 ENCOUNTER — Other Ambulatory Visit: Payer: Self-pay

## 2020-04-09 ENCOUNTER — Encounter: Payer: Self-pay | Admitting: Nurse Practitioner

## 2020-04-09 ENCOUNTER — Ambulatory Visit: Payer: 59 | Admitting: Nurse Practitioner

## 2020-04-09 VITALS — BP 145/89 | HR 77 | Temp 98.0°F | Resp 16 | Ht 74.0 in | Wt 248.0 lb

## 2020-04-09 DIAGNOSIS — E039 Hypothyroidism, unspecified: Secondary | ICD-10-CM | POA: Diagnosis not present

## 2020-04-09 DIAGNOSIS — I10 Essential (primary) hypertension: Secondary | ICD-10-CM | POA: Diagnosis not present

## 2020-04-09 DIAGNOSIS — G894 Chronic pain syndrome: Secondary | ICD-10-CM

## 2020-04-09 DIAGNOSIS — I2510 Atherosclerotic heart disease of native coronary artery without angina pectoris: Secondary | ICD-10-CM | POA: Diagnosis not present

## 2020-04-09 DIAGNOSIS — F411 Generalized anxiety disorder: Secondary | ICD-10-CM

## 2020-04-09 DIAGNOSIS — I2584 Coronary atherosclerosis due to calcified coronary lesion: Secondary | ICD-10-CM

## 2020-04-09 DIAGNOSIS — F988 Other specified behavioral and emotional disorders with onset usually occurring in childhood and adolescence: Secondary | ICD-10-CM

## 2020-04-09 MED ORDER — LISDEXAMFETAMINE DIMESYLATE 60 MG PO CAPS: 60.0000 mg | ORAL_CAPSULE | ORAL | 0 refills | Status: DC

## 2020-04-09 MED ORDER — BUSPIRONE HCL 30 MG PO TABS: 30.0000 mg | ORAL_TABLET | Freq: Three times a day (TID) | ORAL | 2 refills | Status: DC | PRN
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
## 2020-04-15 ENCOUNTER — Other Ambulatory Visit: Payer: Self-pay | Admitting: Nurse Practitioner

## 2020-04-16 LAB — COMPREHENSIVE METABOLIC PANEL
ALT: 15 IU/L (ref 0–44)
AST: 22 IU/L (ref 0–40)
Albumin/Globulin Ratio: 1.5 (ref 1.2–2.2)
Albumin: 3.7 g/dL — ABNORMAL LOW (ref 3.8–4.9)
Alkaline Phosphatase: 115 IU/L (ref 44–121)
BUN/Creatinine Ratio: 18 (ref 9–20)
BUN: 18 mg/dL (ref 6–24)
Bilirubin Total: <0.2 mg/dL (ref 0.0–1.2)
CO2: 23 mmol/L (ref 20–29)
Calcium: 8.7 mg/dL (ref 8.7–10.2)
Chloride: 103 mmol/L (ref 96–106)
Creatinine, Ser: 0.98 mg/dL (ref 0.76–1.27)
GFR calc Af Amer: 97 mL/min/{1.73_m2} (ref >59)
GFR calc non Af Amer: 84 mL/min/{1.73_m2} (ref >59)
Globulin, Total: 2.4 g/dL (ref 1.5–4.5)
Glucose: 111 mg/dL — ABNORMAL HIGH (ref 65–99)
Potassium: 4.6 mmol/L (ref 3.5–5.2)
Sodium: 138 mmol/L (ref 134–144)
Total Protein: 6.1 g/dL (ref 6.0–8.5)

## 2020-04-16 LAB — CBC
Hematocrit: 38.7 % (ref 37.5–51.0)
Hemoglobin: 13.1 g/dL (ref 13.0–17.7)
MCH: 31.2 pg (ref 26.6–33.0)
MCHC: 33.9 g/dL (ref 31.5–35.7)
MCV: 92 fL (ref 79–97)
Platelets: 216 10*3/uL (ref 150–450)
RBC: 4.2 x10E6/uL (ref 4.14–5.80)
RDW: 13 % (ref 11.6–15.4)
WBC: 4.5 10*3/uL (ref 3.4–10.8)

## 2020-04-16 LAB — LIPID PANEL WITH LDL/HDL RATIO
Cholesterol, Total: 123 mg/dL (ref 100–199)
HDL: 45 mg/dL (ref >39)
LDL Chol Calc (NIH): 61 mg/dL (ref 0–99)
LDL/HDL Ratio: 1.4 ratio (ref 0.0–3.6)
Triglycerides: 87 mg/dL (ref 0–149)
VLDL Cholesterol Cal: 17 mg/dL (ref 5–40)

## 2020-04-16 LAB — TSH: TSH: 2.94 u[IU]/mL (ref 0.450–4.500)

## 2020-04-16 LAB — T4, FREE: Free T4: 1.17 ng/dL (ref 0.82–1.77)

## 2020-04-16 LAB — PSA: Prostate Specific Ag, Serum: 0.7 ng/mL (ref 0.0–4.0)

## 2020-04-18 ENCOUNTER — Telehealth: Payer: Self-pay

## 2020-04-18 ENCOUNTER — Other Ambulatory Visit: Payer: Self-pay | Admitting: Nurse Practitioner

## 2020-04-18 DIAGNOSIS — K047 Periapical abscess without sinus: Secondary | ICD-10-CM

## 2020-04-18 MED ORDER — AMOXICILLIN-POT CLAVULANATE 875-125 MG PO TABS: 1.0000 | ORAL_TABLET | Freq: Two times a day (BID) | ORAL | 0 refills | Status: DC

## 2020-04-18 NOTE — Telephone Encounter (Signed)
Sent prescription for augmentin 875mg  twice daily for 10 days to his pharmacy. He should make appointment with dentist as soon as possible. Thanks.

## 2020-04-18 NOTE — Telephone Encounter (Signed)
Tried to call pt to inform him of rx sent to pharmacy and instructions to see his dentist and no answer on either number

## 2020-04-20 DIAGNOSIS — Z8616 Personal history of COVID-19: Secondary | ICD-10-CM

## 2020-04-20 HISTORY — DX: Personal history of COVID-19: Z86.16

## 2020-04-21 NOTE — Progress Notes (Signed)
Overall, labs good. Discuss at next visit 2/11

## 2020-05-08 ENCOUNTER — Telehealth: Payer: Self-pay

## 2020-05-08 ENCOUNTER — Other Ambulatory Visit: Payer: Self-pay

## 2020-05-08 ENCOUNTER — Other Ambulatory Visit: Payer: Self-pay | Admitting: Nurse Practitioner

## 2020-05-08 DIAGNOSIS — G894 Chronic pain syndrome: Secondary | ICD-10-CM | POA: Insufficient documentation

## 2020-05-08 DIAGNOSIS — F5101 Primary insomnia: Secondary | ICD-10-CM

## 2020-05-08 DIAGNOSIS — F329 Major depressive disorder, single episode, unspecified: Secondary | ICD-10-CM

## 2020-05-08 DIAGNOSIS — F411 Generalized anxiety disorder: Secondary | ICD-10-CM

## 2020-05-08 MED ORDER — ZOLPIDEM TARTRATE 10 MG PO TABS
10.0000 mg | ORAL_TABLET | Freq: Every day | ORAL | 0 refills | Status: DC
Start: 2020-05-08 — End: 2020-06-28

## 2020-05-08 NOTE — Telephone Encounter (Signed)
Refilled #30 with no refills. Will have to follow up as scheduled.

## 2020-05-20 ENCOUNTER — Other Ambulatory Visit: Payer: Self-pay

## 2020-05-20 ENCOUNTER — Encounter: Payer: Self-pay | Admitting: Emergency Medicine

## 2020-05-20 ENCOUNTER — Emergency Department: Payer: 59

## 2020-05-20 DIAGNOSIS — I861 Scrotal varices: Secondary | ICD-10-CM | POA: Diagnosis not present

## 2020-05-20 DIAGNOSIS — J45909 Unspecified asthma, uncomplicated: Secondary | ICD-10-CM | POA: Insufficient documentation

## 2020-05-20 DIAGNOSIS — I2510 Atherosclerotic heart disease of native coronary artery without angina pectoris: Secondary | ICD-10-CM | POA: Insufficient documentation

## 2020-05-20 DIAGNOSIS — Z7984 Long term (current) use of oral hypoglycemic drugs: Secondary | ICD-10-CM | POA: Diagnosis not present

## 2020-05-20 DIAGNOSIS — Z79899 Other long term (current) drug therapy: Secondary | ICD-10-CM | POA: Insufficient documentation

## 2020-05-20 DIAGNOSIS — E119 Type 2 diabetes mellitus without complications: Secondary | ICD-10-CM | POA: Diagnosis not present

## 2020-05-20 DIAGNOSIS — I119 Hypertensive heart disease without heart failure: Secondary | ICD-10-CM | POA: Insufficient documentation

## 2020-05-20 DIAGNOSIS — N433 Hydrocele, unspecified: Secondary | ICD-10-CM | POA: Diagnosis not present

## 2020-05-20 DIAGNOSIS — R52 Pain, unspecified: Secondary | ICD-10-CM

## 2020-05-20 DIAGNOSIS — N50811 Right testicular pain: Secondary | ICD-10-CM | POA: Diagnosis present

## 2020-05-20 DIAGNOSIS — E039 Hypothyroidism, unspecified: Secondary | ICD-10-CM | POA: Diagnosis not present

## 2020-05-20 LAB — URINALYSIS, COMPLETE (UACMP) WITH MICROSCOPIC
Bacteria, UA: NONE SEEN
Bilirubin Urine: NEGATIVE
Glucose, UA: NEGATIVE mg/dL
Ketones, ur: NEGATIVE mg/dL
Leukocytes,Ua: NEGATIVE
Nitrite: NEGATIVE
Protein, ur: 100 mg/dL — AB
RBC / HPF: >50 RBC/hpf — ABNORMAL HIGH (ref 0–5)
Specific Gravity, Urine: 1.026 (ref 1.005–1.030)
Squamous Epithelial / HPF: NONE SEEN (ref 0–5)
pH: 5 (ref 5.0–8.0)

## 2020-05-20 NOTE — ED Triage Notes (Signed)
Patient ambulatory to triage with steady gait, without difficulty or distress noted; pt reports pain & swelling to right testicle x wk accomp urinary urgency

## 2020-05-21 ENCOUNTER — Emergency Department
Admission: EM | Admit: 2020-05-21 | Discharge: 2020-05-21 | Disposition: A | Discharge status: Home / Self Care | Payer: 59 | Attending: Emergency Medicine | Admitting: Emergency Medicine

## 2020-05-21 DIAGNOSIS — I861 Scrotal varices: Secondary | ICD-10-CM

## 2020-05-21 DIAGNOSIS — N433 Hydrocele, unspecified: Secondary | ICD-10-CM

## 2020-05-21 DIAGNOSIS — R52 Pain, unspecified: Secondary | ICD-10-CM

## 2020-05-21 MED ORDER — NAPROXEN 500 MG PO TABS: 500.0000 mg | ORAL_TABLET | Freq: Two times a day (BID) | ORAL | 0 refills | Status: DC

## 2020-05-21 NOTE — ED Notes (Signed)
Patient discharged to home per MD order. Patient in stable condition, and deemed medically cleared by ED provider for discharge. Discharge instructions reviewed with patient/family using "Teach Back"; verbalized understanding of medication education and administration, and information about follow-up care. Denies further concerns. ° °
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
## 2020-05-21 NOTE — Discharge Instructions (Signed)
Use athletic supporter to elevate scrotum as much as possible. You may take pain medicine as needed (Naprosyn). Return to the ER for worsening symptoms, persistent vomiting, difficulty breathing or other concerns.

## 2020-05-28 ENCOUNTER — Ambulatory Visit (INDEPENDENT_AMBULATORY_CARE_PROVIDER_SITE_OTHER): Payer: 59 | Admitting: Urology

## 2020-05-28 ENCOUNTER — Ambulatory Visit: Payer: Self-pay | Admitting: Urology

## 2020-05-28 ENCOUNTER — Other Ambulatory Visit: Payer: Self-pay

## 2020-05-28 ENCOUNTER — Encounter: Payer: Self-pay | Admitting: *Deleted

## 2020-05-28 VITALS — BP 162/90 | HR 84 | Ht 73.0 in | Wt 240.0 lb

## 2020-05-28 DIAGNOSIS — N401 Enlarged prostate with lower urinary tract symptoms: Secondary | ICD-10-CM

## 2020-05-28 DIAGNOSIS — R300 Dysuria: Secondary | ICD-10-CM

## 2020-05-28 DIAGNOSIS — K4090 Unilateral inguinal hernia, without obstruction or gangrene, not specified as recurrent: Secondary | ICD-10-CM | POA: Diagnosis not present

## 2020-05-28 DIAGNOSIS — R3129 Other microscopic hematuria: Secondary | ICD-10-CM | POA: Diagnosis not present

## 2020-05-28 LAB — URINALYSIS, COMPLETE
Bilirubin, UA: NEGATIVE
Glucose, UA: NEGATIVE
Ketones, UA: NEGATIVE
Nitrite, UA: NEGATIVE
Protein,UA: NEGATIVE
Specific Gravity, UA: <1.005 — ABNORMAL LOW (ref 1.005–1.030)
Urobilinogen, Ur: 0.2 mg/dL (ref 0.2–1.0)
pH, UA: 5 (ref 5.0–7.5)

## 2020-05-28 LAB — MICROSCOPIC EXAMINATION

## 2020-05-28 MED ORDER — TAMSULOSIN HCL 0.4 MG PO CAPS: 0.4000 mg | ORAL_CAPSULE | Freq: Every day | ORAL | 3 refills | Status: DC
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
## 2020-05-29 ENCOUNTER — Encounter: Payer: Self-pay | Admitting: Urology

## 2020-05-31 ENCOUNTER — Ambulatory Visit: Payer: 59 | Admitting: Hospice and Palliative Medicine

## 2020-05-31 ENCOUNTER — Telehealth: Payer: Self-pay | Admitting: *Deleted

## 2020-05-31 ENCOUNTER — Encounter: Payer: Self-pay | Admitting: Hospice and Palliative Medicine

## 2020-05-31 ENCOUNTER — Other Ambulatory Visit: Payer: Self-pay

## 2020-05-31 VITALS — BP 148/74 | HR 88 | Temp 97.6°F | Resp 16 | Ht 73.0 in | Wt 245.0 lb

## 2020-05-31 DIAGNOSIS — I517 Cardiomegaly: Secondary | ICD-10-CM

## 2020-05-31 DIAGNOSIS — I10 Essential (primary) hypertension: Secondary | ICD-10-CM | POA: Diagnosis not present

## 2020-05-31 DIAGNOSIS — G4719 Other hypersomnia: Secondary | ICD-10-CM

## 2020-05-31 DIAGNOSIS — N5082 Scrotal pain: Secondary | ICD-10-CM | POA: Diagnosis not present

## 2020-05-31 MED ORDER — OXYCODONE HCL 10 MG PO TABS: 10.0000 mg | ORAL_TABLET | ORAL | 0 refills | Status: DC

## 2020-05-31 MED ORDER — BUPROPION HCL ER (SR) 150 MG PO TB12: 150.0000 mg | ORAL_TABLET | Freq: Two times a day (BID) | ORAL | 1 refills | Status: DC
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
## 2020-05-31 NOTE — Telephone Encounter (Signed)
Talked with patient wife about my chart message, It may take up to two weeks to be scheduled for ct scan. Advised wife we don't do hernia repairs

## 2020-06-04 ENCOUNTER — Encounter: Payer: Self-pay | Admitting: Internal Medicine

## 2020-06-04 ENCOUNTER — Other Ambulatory Visit: Payer: Self-pay

## 2020-06-04 ENCOUNTER — Ambulatory Visit (INDEPENDENT_AMBULATORY_CARE_PROVIDER_SITE_OTHER): Payer: 59 | Admitting: Internal Medicine

## 2020-06-04 VITALS — BP 142/90 | HR 86 | Temp 98.2°F | Resp 16 | Ht 73.0 in | Wt 246.6 lb

## 2020-06-04 DIAGNOSIS — E782 Mixed hyperlipidemia: Secondary | ICD-10-CM

## 2020-06-04 DIAGNOSIS — G4719 Other hypersomnia: Secondary | ICD-10-CM

## 2020-06-04 DIAGNOSIS — I10 Essential (primary) hypertension: Secondary | ICD-10-CM

## 2020-06-04 DIAGNOSIS — Z9861 Coronary angioplasty status: Secondary | ICD-10-CM

## 2020-06-04 DIAGNOSIS — F11288 Opioid dependence with other opioid-induced disorder: Secondary | ICD-10-CM | POA: Diagnosis not present

## 2020-06-04 DIAGNOSIS — Z9119 Patient's noncompliance with other medical treatment and regimen: Secondary | ICD-10-CM

## 2020-06-04 DIAGNOSIS — Z91199 Patient's noncompliance with other medical treatment and regimen due to unspecified reason: Secondary | ICD-10-CM

## 2020-06-04 DIAGNOSIS — I2510 Atherosclerotic heart disease of native coronary artery without angina pectoris: Secondary | ICD-10-CM | POA: Diagnosis not present

## 2020-06-04 DIAGNOSIS — Z6835 Body mass index (BMI) 35.0-35.9, adult: Secondary | ICD-10-CM

## 2020-06-04 MED ORDER — LISDEXAMFETAMINE DIMESYLATE 40 MG PO CAPS
40.0000 mg | ORAL_CAPSULE | ORAL | 0 refills | Status: DC
Start: 2020-06-04 — End: 2020-06-24

## 2020-06-04 MED ORDER — CARVEDILOL 12.5 MG PO TABS
12.5000 mg | ORAL_TABLET | Freq: Two times a day (BID) | ORAL | 1 refills | Status: DC
Start: 2020-06-04 — End: 2020-09-11

## 2020-06-04 MED ORDER — AMLODIPINE BESYLATE 5 MG PO TABS: 5.0000 mg | ORAL_TABLET | Freq: Every day | ORAL | 1 refills | Status: DC

## 2020-06-04 MED ORDER — ATORVASTATIN CALCIUM 40 MG PO TABS
ORAL_TABLET | ORAL | 1 refills | Status: DC
Start: 2020-06-04 — End: 2020-09-11
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
## 2020-06-08 ENCOUNTER — Encounter: Payer: Self-pay | Admitting: Hospice and Palliative Medicine

## 2020-06-18 ENCOUNTER — Ambulatory Visit
Admission: RE | Admit: 2020-06-18 | Discharge: 2020-06-18 | Disposition: A | Discharge status: Home / Self Care | Payer: 59 | Source: Ambulatory Visit | Attending: Urology | Admitting: Urology

## 2020-06-18 ENCOUNTER — Other Ambulatory Visit: Payer: Self-pay

## 2020-06-18 DIAGNOSIS — R3129 Other microscopic hematuria: Secondary | ICD-10-CM

## 2020-06-18 LAB — POCT I-STAT CREATININE: Creatinine, Ser: 0.8 mg/dL (ref 0.61–1.24)

## 2020-06-18 MED ORDER — IOHEXOL 300 MG/ML  SOLN
125.0000 mL | Freq: Once | INTRAMUSCULAR | Status: AC | PRN
  Administered 2020-06-18: 125 mL via INTRAVENOUS

## 2020-06-19 ENCOUNTER — Encounter: Payer: Self-pay | Admitting: Urology

## 2020-06-21 ENCOUNTER — Telehealth: Payer: Self-pay | Admitting: Urology

## 2020-06-21 DIAGNOSIS — K4090 Unilateral inguinal hernia, without obstruction or gangrene, not specified as recurrent: Secondary | ICD-10-CM

## 2020-06-21 NOTE — Telephone Encounter (Signed)
Notified patient as instructed, patient pleased. Discussed follow-up appointments, patient agrees  

## 2020-06-21 NOTE — Telephone Encounter (Signed)
CT showed a large right renal stone and a right inguinal hernia.  Needs a follow-up appoint with me to discuss stone treatment and I placed a general surgery referral to discuss hernia repair.

## 2020-06-24 ENCOUNTER — Other Ambulatory Visit: Payer: Self-pay

## 2020-06-24 ENCOUNTER — Ambulatory Visit (INDEPENDENT_AMBULATORY_CARE_PROVIDER_SITE_OTHER): Payer: 59 | Admitting: Hospice and Palliative Medicine

## 2020-06-24 ENCOUNTER — Encounter: Payer: Self-pay | Admitting: Urology

## 2020-06-24 ENCOUNTER — Encounter: Payer: Self-pay | Admitting: Hospice and Palliative Medicine

## 2020-06-24 ENCOUNTER — Ambulatory Visit (INDEPENDENT_AMBULATORY_CARE_PROVIDER_SITE_OTHER): Payer: 59 | Admitting: Urology

## 2020-06-24 VITALS — BP 146/98 | HR 87 | Ht 70.0 in | Wt 239.0 lb

## 2020-06-24 VITALS — BP 138/88 | HR 90 | Temp 98.0°F | Resp 16 | Ht 73.0 in | Wt 239.0 lb

## 2020-06-24 DIAGNOSIS — N200 Calculus of kidney: Secondary | ICD-10-CM

## 2020-06-24 DIAGNOSIS — F11288 Opioid dependence with other opioid-induced disorder: Secondary | ICD-10-CM

## 2020-06-24 DIAGNOSIS — G4733 Obstructive sleep apnea (adult) (pediatric): Secondary | ICD-10-CM

## 2020-06-24 DIAGNOSIS — I10 Essential (primary) hypertension: Secondary | ICD-10-CM

## 2020-06-24 DIAGNOSIS — F331 Major depressive disorder, recurrent, moderate: Secondary | ICD-10-CM

## 2020-06-24 DIAGNOSIS — G4719 Other hypersomnia: Secondary | ICD-10-CM

## 2020-06-24 DIAGNOSIS — N5082 Scrotal pain: Secondary | ICD-10-CM

## 2020-06-24 MED ORDER — BUPROPION HCL ER (XL) 150 MG PO TB24: 150.0000 mg | ORAL_TABLET | Freq: Every day | ORAL | 1 refills | Status: DC

## 2020-06-24 MED ORDER — OXYCODONE HCL 10 MG PO TABS: 10.0000 mg | ORAL_TABLET | ORAL | 0 refills | Status: DC

## 2020-06-24 MED ORDER — LISDEXAMFETAMINE DIMESYLATE 40 MG PO CAPS: 40.0000 mg | ORAL_CAPSULE | ORAL | 0 refills | Status: DC
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
## 2020-06-25 ENCOUNTER — Ambulatory Visit (INDEPENDENT_AMBULATORY_CARE_PROVIDER_SITE_OTHER): Payer: 59 | Admitting: Surgery

## 2020-06-25 ENCOUNTER — Encounter: Payer: Self-pay | Admitting: Urology

## 2020-06-25 ENCOUNTER — Telehealth: Payer: Self-pay | Admitting: *Deleted

## 2020-06-25 ENCOUNTER — Encounter: Payer: Self-pay | Admitting: Hospice and Palliative Medicine

## 2020-06-25 ENCOUNTER — Encounter: Payer: Self-pay | Admitting: Surgery

## 2020-06-25 ENCOUNTER — Ambulatory Visit: Payer: Self-pay | Admitting: Surgery

## 2020-06-25 VITALS — BP 129/74 | HR 69 | Temp 98.0°F | Ht 73.0 in | Wt 242.0 lb

## 2020-06-25 DIAGNOSIS — K4090 Unilateral inguinal hernia, without obstruction or gangrene, not specified as recurrent: Secondary | ICD-10-CM | POA: Diagnosis not present
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
## 2020-06-25 NOTE — Telephone Encounter (Signed)
Patient called in and states his pharmacy has not got any pain medication yet

## 2020-06-25 NOTE — Patient Instructions (Addendum)
Our surgery scheduler Pamala Hurry will call you within 24-48 hours to get you scheduled. If you have not heard from her after 48 hours, please call our office. You will need to get Covid tested before surgery and have the blue sheet available when she calls to write down important information. If you have any concerns or questions, please feel free to call our office.     Inguinal Hernia, Adult An inguinal hernia is when fat or your intestines push through a weak spot in a muscle where your leg meets your lower belly (groin). This causes a bulge. This kind of hernia could also be:  In your scrotum, if you are male.  In folds of skin around your vagina, if you are male. There are three types of inguinal hernias:  Hernias that can be pushed back into the belly (are reducible). This type rarely causes pain.  Hernias that cannot be pushed back into the belly (are incarcerated).  Hernias that cannot be pushed back into the belly and lose their blood supply (are strangulated). This type needs emergency surgery. What are the causes? This condition is caused by having a weak spot in the muscles or tissues in your groin. This develops over time. The hernia may poke through the weak spot when you strain your lower belly muscles all of a sudden, such as when you:  Lift a heavy object.  Strain to poop (have a bowel movement). Trouble pooping (constipation) can lead to straining.  Cough. What increases the risk? This condition is more likely to develop in:  Males.  Pregnant females.  People who: ? Are overweight. ? Work in jobs that require long periods of standing or heavy lifting. ? Have had an inguinal hernia before. ? Smoke or have lung disease. These factors can lead to long-term (chronic) coughing. What are the signs or symptoms? Symptoms may depend on the size of the hernia. Often, a small hernia has no symptoms. Symptoms of a larger hernia may include:  A bulge in the groin area.  This is easier to see when standing. You might not be able to see it when you are lying down.  Pain or burning in the groin. This may get worse when you lift, strain, or cough.  A dull ache or a feeling of pressure in the groin.  An abnormal bulge in the scrotum, in males. Symptoms of a strangulated inguinal hernia may include:  A bulge in your groin that is very painful and tender to the touch.  A bulge that turns red or purple.  Fever, feeling like you may vomit (nausea), and vomiting.  Not being able to poop or to pass gas. How is this treated? Treatment depends on the size of your hernia and whether you have symptoms. If you do not have symptoms, your doctor may have you watch your hernia carefully and have you come in for follow-up visits. If your hernia is large or if you have symptoms, you may need surgery to repair the hernia. Follow these instructions at home: Lifestyle  Avoid lifting heavy objects.  Avoid standing for long amounts of time.  Do not smoke or use any products that contain nicotine or tobacco. If you need help quitting, ask your doctor.  Stay at a healthy weight. Prevent trouble pooping You may need to take these actions to prevent or treat trouble pooping:  Drink enough fluid to keep your pee (urine) pale yellow.  Take over-the-counter or prescription medicines.  Eat foods that  are high in fiber. These include beans, whole grains, and fresh fruits and vegetables.  Limit foods that are high in fat and sugar. These include fried or sweet foods. General instructions  You may try to push your hernia back in place by very gently pressing on it when you are lying down. Do not try to push the bulge back in if it will not go in easily.  Watch your hernia for any changes in shape, size, or color. Tell your doctor if you see any changes.  Take over-the-counter and prescription medicines only as told by your doctor.  Keep all follow-up visits. Contact a  doctor if:  You have a fever or chills.  You have new symptoms.  Your symptoms get worse. Get help right away if:  You have pain in your groin that gets worse all of a sudden.  You have a bulge in your groin that: ? Gets bigger all of a sudden, and it does not get smaller after that. ? Turns red or purple. ? Is painful when you touch it.  You are a male, and you have: ? Sudden pain in your scrotum. ? A sudden change in the size of your scrotum.  You cannot push the hernia back in place by very gently pressing on it when you are lying down.  You feel like you may vomit, and that feeling does not go away.  You keep vomiting.  You have a fast heartbeat.  You cannot poop or pass gas. These symptoms may be an emergency. Get help right away. Call your local emergency services (911 in the [place]  Do not wait to see if the symptoms will go away.  Do not drive yourself to the hospital. Summary  An inguinal hernia is when fat or your intestines push through a weak spot in a muscle where your leg meets your lower belly (groin). This causes a bulge.  If you do not have symptoms, you may not need treatment. If you have symptoms or a large hernia, you may need surgery.  Avoid lifting heavy objects. Also, avoid standing for long amounts of time.  Do not try to push the bulge back in if it will not go in easily. This information is not intended to replace advice given to you by your health care provider. Make sure you discuss any questions you have with your health care provider. Document Revised: 12/05/2019 Document Reviewed: 12/05/2019 Elsevier Patient Education  2021 [employer].

## 2020-06-25 NOTE — Telephone Encounter (Signed)
Will be unable to give pain medication Rx since he got an Rx from his PCP yesterday.
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
## 2020-06-26 NOTE — Telephone Encounter (Signed)
Notified patient as instructed, patient pleased °

## 2020-06-27 ENCOUNTER — Telehealth: Payer: Self-pay | Admitting: Surgery

## 2020-06-27 ENCOUNTER — Other Ambulatory Visit: Payer: Self-pay | Admitting: Internal Medicine

## 2020-06-27 ENCOUNTER — Telehealth: Payer: Self-pay

## 2020-06-27 ENCOUNTER — Other Ambulatory Visit: Payer: Self-pay | Admitting: Nurse Practitioner

## 2020-06-27 DIAGNOSIS — F5101 Primary insomnia: Secondary | ICD-10-CM

## 2020-06-27 NOTE — Telephone Encounter (Signed)
Patient has been advised we are not able to prescribe pain medication and needs to contact his pain management physician. beth

## 2020-06-27 NOTE — Telephone Encounter (Signed)
Outgoing call is made, unable to leave a message both numbers.  Please advise patient of Pre-Admission date/time, COVID Testing date and Surgery date.  Surgery Date: 07/17/20  Preadmission Testing Date: 07/10/20 (phone 8a-1p) Covid Testing Date: 07/15/20 in person at 9:50 am, patient advised to go to the [facility] ([place]).  Also patient to call at [phone], between 1-3:00pm the day before surgery, to find out what time to arrive for surgery.

## 2020-07-01 NOTE — Telephone Encounter (Signed)
Outgoing call is made.  Patient is aware of all dates regarding his surgery, this is also mailed at his request.

## 2020-07-04 ENCOUNTER — Other Ambulatory Visit: Payer: Self-pay | Admitting: Nurse Practitioner

## 2020-07-04 DIAGNOSIS — F411 Generalized anxiety disorder: Secondary | ICD-10-CM

## 2020-07-04 DIAGNOSIS — F329 Major depressive disorder, single episode, unspecified: Secondary | ICD-10-CM

## 2020-07-05 ENCOUNTER — Other Ambulatory Visit: Payer: Self-pay

## 2020-07-05 DIAGNOSIS — R110 Nausea: Secondary | ICD-10-CM

## 2020-07-05 MED ORDER — ONDANSETRON HCL 8 MG PO TABS: ORAL_TABLET | ORAL | 2 refills | Status: DC

## 2020-07-08 ENCOUNTER — Telehealth: Payer: Self-pay | Admitting: Urology

## 2020-07-08 NOTE — Telephone Encounter (Signed)
-----   Message from Abbie Sons, MD sent at 07/06/2020  6:28 PM EDT ----- Regarding: RE: PCNL Please schedule appointment with Dr. Erlene Quan for scheduling and discussion of PCNL ----- Message ----- From: Hollice Espy, MD Sent: 06/30/2020   2:32 PM EDT To: Abbie Sons, MD, Billey Co, MD Subject: RE: PCNL                                       I would love to do it!  Just have him follow-up with me sometime in the near future?  Hollice Espy, MD  ----- Message ----- From: Abbie Sons, MD Sent: 06/30/2020  11:35 AM EDT To: Billey Co, MD, Hollice Espy, MD Subject: PCNL                                           Anyone interested in doing a PCNL on this guy?

## 2020-07-08 NOTE — Telephone Encounter (Signed)
App made patient is aware  Sharyn Lull

## 2020-07-10 ENCOUNTER — Other Ambulatory Visit
Admission: RE | Admit: 2020-07-10 | Discharge: 2020-07-10 | Disposition: A | Discharge status: Home / Self Care | Payer: 59 | Source: Ambulatory Visit | Attending: Surgery | Admitting: Surgery

## 2020-07-10 ENCOUNTER — Other Ambulatory Visit: Payer: Self-pay

## 2020-07-10 ENCOUNTER — Encounter: Payer: Self-pay | Admitting: Surgery

## 2020-07-10 ENCOUNTER — Encounter
Admission: RE | Admit: 2020-07-10 | Discharge: 2020-07-10 | Disposition: A | Discharge status: Home / Self Care | Payer: 59 | Source: Ambulatory Visit | Attending: Surgery | Admitting: Surgery

## 2020-07-10 DIAGNOSIS — Z01818 Encounter for other preprocedural examination: Secondary | ICD-10-CM | POA: Insufficient documentation

## 2020-07-10 LAB — CBC WITH DIFFERENTIAL/PLATELET
Abs Immature Granulocytes: 0.01 10*3/uL (ref 0.00–0.07)
Basophils Absolute: 0 10*3/uL (ref 0.0–0.1)
Basophils Relative: 1 %
Eosinophils Absolute: 0.4 10*3/uL (ref 0.0–0.5)
Eosinophils Relative: 8 %
HCT: 36.1 % — ABNORMAL LOW (ref 39.0–52.0)
Hemoglobin: 12.2 g/dL — ABNORMAL LOW (ref 13.0–17.0)
Immature Granulocytes: 0 %
Lymphocytes Relative: 31 %
Lymphs Abs: 1.7 10*3/uL (ref 0.7–4.0)
MCH: 31 pg (ref 26.0–34.0)
MCHC: 33.8 g/dL (ref 30.0–36.0)
MCV: 91.9 fL (ref 80.0–100.0)
Monocytes Absolute: 0.4 10*3/uL (ref 0.1–1.0)
Monocytes Relative: 8 %
Neutro Abs: 2.8 10*3/uL (ref 1.7–7.7)
Neutrophils Relative %: 52 %
Platelets: 230 10*3/uL (ref 150–400)
RBC: 3.93 MIL/uL — ABNORMAL LOW (ref 4.22–5.81)
RDW: 14.1 % (ref 11.5–15.5)
WBC: 5.3 10*3/uL (ref 4.0–10.5)
nRBC: 0 % (ref 0.0–0.2)

## 2020-07-10 LAB — HEMOGLOBIN A1C
Hgb A1c MFr Bld: 5.3 % (ref 4.8–5.6)
Mean Plasma Glucose: 105.41 mg/dL

## 2020-07-10 LAB — COMPREHENSIVE METABOLIC PANEL
ALT: 17 U/L (ref 0–44)
AST: 23 U/L (ref 15–41)
Albumin: 3.4 g/dL — ABNORMAL LOW (ref 3.5–5.0)
Alkaline Phosphatase: 94 U/L (ref 38–126)
Anion gap: 6 (ref 5–15)
BUN: 13 mg/dL (ref 6–20)
CO2: 29 mmol/L (ref 22–32)
Calcium: 8.7 mg/dL — ABNORMAL LOW (ref 8.9–10.3)
Chloride: 105 mmol/L (ref 98–111)
Creatinine, Ser: 0.92 mg/dL (ref 0.61–1.24)
GFR, Estimated: >60 mL/min (ref >60)
Glucose, Bld: 157 mg/dL — ABNORMAL HIGH (ref 70–99)
Potassium: 3.5 mmol/L (ref 3.5–5.1)
Sodium: 140 mmol/L (ref 135–145)
Total Bilirubin: 0.6 mg/dL (ref 0.3–1.2)
Total Protein: 6.3 g/dL — ABNORMAL LOW (ref 6.5–8.1)

## 2020-07-10 NOTE — Patient Instructions (Signed)
Your procedure is scheduled on: 07/17/20 Report to [facility]. To find out your arrival time please call [phone] between 1PM - 3PM on 07/16/20.  Remember: Instructions that are not followed completely may result in serious medical risk, up to and including death, or upon the discretion of your surgeon and anesthesiologist your surgery may need to be rescheduled.     _X__ 1. Do not eat food after midnight the night before your procedure.                 No gum chewing or hard candies. You may drink clear liquids up to 2 hours                 before you are scheduled to arrive for your surgery- DO not drink clear                 liquids within 2 hours of the start of your surgery.                 Clear Liquids include:  water, apple juice without pulp, clear carbohydrate                 drink such as Clearfast or Gatorade, Black Coffee or Tea (Do not add                 anything to coffee or tea). Diabetics water only  __X__2.  On the morning of surgery brush your teeth with toothpaste and water, you                 may rinse your mouth with mouthwash if you wish.  Do not swallow any              toothpaste of mouthwash.     _X__ 3.  No Alcohol for 24 hours before or after surgery.   _X__ 4.  Do Not Smoke or use e-cigarettes For 24 Hours Prior to Your Surgery.                 Do not use any chewable tobacco products for at least 6 hours prior to                 surgery.  ____  5.  Bring all medications with you on the day of surgery if instructed.   __X__  6.  Notify your doctor if there is any change in your medical condition      (cold, fever, infections).     Do not wear jewelry, make-up, hairpins, clips or nail polish. Do not wear lotions, powders, or perfumes.  Do not shave 48 hours prior to surgery. Men may shave face and neck. Do not bring valuables to the hospital.    [facility] is not responsible for any belongings or  valuables.  Contacts, dentures/partials or body piercings may not be worn into surgery. Bring a case for your contacts, glasses or hearing aids, a denture cup will be supplied. Leave your suitcase in the car. After surgery it may be brought to your room. For patients admitted to the hospital, discharge time is determined by your treatment team.   Patients discharged the day of surgery will not be allowed to drive home.   Please read over the following fact sheets that you were given:   MRSA Information  __X__ Take these medicines the morning of surgery with A SIP OF WATER:  1. amLODipine (NORVASC) 5 MG tablet  2. buPROPion (WELLBUTRIN XL) 150 MG 24 hr tablet  3. carvedilol (COREG) 12.5 MG tablet  4. DULoxetine (CYMBALTA) 60 MG capsule  5. levothyroxine (SYNTHROID) 75 MCG tablet  6. Morphine Sulfate ER 30 MG T12A  7. oxyCODONE (ROXICODONE) 15 MG immediate release tablet  8. tamsulosin (FLOMAX) 0.4 MG CAPS capsule  9. busPIRone (BUSPAR) 30 MG tablet if needed  10. naloxegol oxalate (MOVANTIK) 25 MG TABS tablet  ____ Fleet Enema (as directed)   __X__ Use CHG Soap/SAGE wipes as directed  __X__ Use inhalers on the day of surgery  ____ Stop metformin/Janumet/Farxiga 2 days prior to surgery    ____ Take 1/2 of usual insulin dose the night before surgery. No insulin the morning          of surgery.   ____ Stop Blood Thinners Coumadin/Plavix/Xarelto/Pleta/Pradaxa/Eliquis/Effient/Aspirin  on   Or contact your Surgeon, Cardiologist or Medical Doctor regarding  ability to stop your blood thinners  __X__ Stop Anti-inflammatories 7 days before surgery such as Advil, Ibuprofen, Motrin,  BC or Goodies Powder, Naprosyn, Naproxen, Aleve, Aspirin    __X__ Stop all herbal supplements, fish oil or vitamin E until after surgery.    ____ Bring C-Pap to the hospital.

## 2020-07-11 ENCOUNTER — Ambulatory Visit (INDEPENDENT_AMBULATORY_CARE_PROVIDER_SITE_OTHER): Payer: 59 | Admitting: Urology

## 2020-07-11 VITALS — BP 121/73 | HR 83 | Ht 74.0 in | Wt 235.0 lb

## 2020-07-11 DIAGNOSIS — N200 Calculus of kidney: Secondary | ICD-10-CM | POA: Diagnosis not present

## 2020-07-11 DIAGNOSIS — R310 Gross hematuria: Secondary | ICD-10-CM | POA: Diagnosis not present

## 2020-07-11 MED ORDER — SILDENAFIL CITRATE 20 MG PO TABS: 20.0000 mg | ORAL_TABLET | Freq: Every day | ORAL | 3 refills | Status: DC | PRN

## 2020-07-11 NOTE — Patient Instructions (Addendum)
Percutaneous Nephrolithotomy Percutaneous nephrolithotomy is a procedure to remove kidney stones. Kidney stones are deposits that form inside your kidneys and can cause pain. You may need this procedure if:  You have large kidney stones. Kidney stones that are bigger than 2 cm (0.78 in.) wide may require this procedure.  Your kidney stones are oddly shaped.  Other treatments have not been successful in helping the kidney stones to pass.  You have developed an infection due to the kidney stones. Tell a health care provider about:  Any allergies you have.  All medicines you are taking, including vitamins, herbs, eye drops, creams, and over-the-counter medicines.  Any problems you or family members have had with anesthetic medicines.  Any blood disorders you have.  Any surgeries you have had.  Any medical conditions you have.  Whether you are pregnant or may be pregnant.  Whether you use any tobacco products, including cigarettes, chewing tobacco, or e-cigarettes. What are the risks? Generally, this is a safe procedure. However, problems may occur, including:  Infection.  Bleeding. This may include blood in your urine.  Allergic reactions to medicines.  Damage to other structures or organs.  Kidney damage.  Holes in the kidney. These often heal on their own.  Numbness or tingling in the affected area.  Inability to remove all the stones. You may need a different procedure to complete treatment. What happens before the procedure? Staying hydrated Follow instructions from your health care provider about hydration, which may include:  Up to 2 hours before the procedure - you may continue to drink clear liquids, such as water, clear fruit juice, black coffee, and plain tea.   Eating and drinking restrictions Follow instructions from your health care provider about eating and drinking, which may include:  8 hours before the procedure - stop eating heavy meals or foods,  such as meat, fried foods, or fatty foods.  6 hours before the procedure - stop eating light meals or foods, such as toast or cereal.  6 hours before the procedure - stop drinking milk or drinks that contain milk.  2 hours before the procedure - stop drinking clear liquids. Medicines Ask your health care provider about:  Changing or stopping your regular medicines. This is especially important if you are taking diabetes medicines or blood thinners.  Taking medicines such as aspirin and ibuprofen. These medicines can thin your blood. Do not take these medicines unless your health care provider tells you to take them.  Taking over-the-counter medicines, vitamins, herbs, and supplements. Tests You may have tests, including:  Blood tests.  Urine tests.  Tests to check how your heart is working.  Imaging studies. These are used to identify: ? The size and number (stone burden) of the kidney stones. ? The position of the kidney stones. General instructions  Plan to have someone take you home from the hospital or clinic.  Plan to have a responsible adult care for you for at least 24 hours after you leave the hospital or clinic. This is important.  Ask your health care provider how your surgical site will be marked or identified.  Ask your health care provider what steps will be taken to help prevent infection. These may include: ? Removing hair at the surgery site. ? Washing skin with a germ-killing soap. ? Taking antibiotic medicine. What happens during the procedure?  An IV will be inserted into one of your veins.  The site of the procedure will be marked.  You will be  given one or more of the following: ? A medicine to help you relax (sedative). ? A medicine to numb the area (local anesthetic). ? A medicine to make you fall asleep (general anesthetic). ? A medicine that is injected into your spine to numb the area below and slightly above the injection site (spinal  anesthetic). ? A medicine that is injected into an area of your body to numb everything below the injection site (regional anesthetic).  A thin tube (urinary catheter) will be put in your bladder to drain urine during and after the procedure.  Your surgeon will make a small cut (incision) in your lower back.  A tube will be inserted through the incision into your kidney.  Each kidney stone will be removed through this tube. Larger stones may need to be broken up with a high-intensity light beam (laser) or other tools.  After all of the stones have been removed, your health care provider may put in tubes to drain your bladder. Based on your condition: ? An internal tube, called a stent, may be put in your ureter. This will help drain urine from your kidney to your bladder. ? A surgical drain (nephrostomy tube) may be put in your kidney. The tube comes out through the incision in your lower back. This will help to drain urine or any fluid that builds up while your kidney heals.  Part of the incision may be closed with stitches (sutures).  A bandage (dressing) will be placed over the incision area. The procedure may vary among health care providers and hospitals.   What happens after the procedure?  Your blood pressure, heart rate, breathing rate, and blood oxygen level will be monitored until you leave the hospital or clinic.  You may be given medicine for pain.  You will be shown how to do breathing exercises, such as coughing and breathing deeply. These will help to prevent pneumonia.  You will be encouraged to walk. Walking helps to prevent blood clots.  Your stent and urinary catheter will be removed after 1-2 days if there is only a small amount of blood in your urine.  You will be taught how to care for the catheter or nephrostomy tube, if you have them.  Do not drive for 24 hours if you were given a sedative during your procedure. Summary  Percutaneous nephrolithotomy is a  procedure to remove kidney stones.  Ask your health care provider about changing or stopping your regular medicines.  Before surgery, follow instructions from your health care provider about eating and drinking.  Plan to have someone take you home from the hospital or clinic. This information is not intended to replace advice given to you by your health care provider. Make sure you discuss any questions you have with your health care provider. Document Revised: 06/02/2018 Document Reviewed: 10/27/2017 Elsevier Patient Education  2021 [place].  Percutaneous Nephrolithotomy, Care After This sheet gives you information about how to care for yourself after your procedure. Your health care provider may also give you more specific instructions. If you have problems or questions, contact your health care provider. What can I expect after the procedure? After the procedure, it is common to have:  Soreness or pain.  A small amount of blood or clear fluid coming from your incision for a few days.  Fatigue.  Some blood in your urine. This will last for a few days. Follow these instructions at home: Medicines  Take over-the-counter and prescription medicines only as  told by your health care provider.  If you were prescribed an antibiotic medicine, take it as told by your health care provider. Do not stop using the antibiotic even if you start to feel better.  Ask your health care provider if the medicine prescribed to you: ? Requires you to avoid driving or using heavy machinery. ? Can cause constipation. You may need to take these actions to prevent or treat constipation:  Drink enough fluid to keep your urine pale yellow.  Take over-the-counter or prescription medicines.  Eat foods that are high in fiber, such as beans, whole grains, and fresh fruits and vegetables.  Limit foods that are high in fat and processed sugars, such as fried or sweet foods. Incision care  Follow  instructions from your health care provider about how to take care of your incision. Make sure you: ? Wash your hands with soap and water before and after you change your bandage (dressing). If soap and water are not available, use hand sanitizer. ? Change your dressing as told by your health care provider. ? Leave stitches (sutures), skin glue, or adhesive strips in place. These skin closures may need to stay in place for 2 weeks or longer. If adhesive strip edges start to loosen and curl up, you may trim the loose edges. Do not remove adhesive strips completely unless your health care provider tells you to do that.  Check your incision area every day for signs of infection. Check for: ? Redness, swelling, or pain. ? More fluid or blood. ? Warmth. ? Pus or a bad smell.  Do not take baths, swim, or use a hot tub until your health care provider approves. Ask your health care provider if you may take showers. You may only be allowed to take sponge baths.   Activity  Avoid strenuous activities for as long as told by your health care provider.  Return to your normal activities as told by your health care provider. Ask your health care provider what activities are safe for you. General instructions  If you were sent home with a catheter or surgical drain (nephrostomy tube), follow your health care provider's instructions on how to take care of it.  Wear compression stockings as told by your health care provider. These stockings help to prevent blood clots and reduce swelling in your legs.  Do not use any products that contain nicotine or tobacco, such as cigarettes, e-cigarettes, and chewing tobacco. These can delay incision healing after surgery. If you need help quitting, ask your health care provider.  Keep all follow-up visits as told by your health care provider. This is important. ? If you still have a stent, your health care provider will need to remove it after 1-2 weeks. Contact a  health care provider if:  You have a fever.  You have redness, swelling, or pain around your incision.  You have more fluid or blood coming from your incision.  Your incision feels warm to the touch.  You have pus or a bad smell coming from your incision.  You lose your appetite.  You feel nauseous or you vomit.  You have been sent home with a urinary catheter or a surgical drain and urine flow suddenly stops, followed by kidney pain. Get help right away if:  You notice a large amount of blood in your urine.  You have blood clots in your urine.  You cannot urinate.  You have chest pain or trouble breathing. Summary  After the procedure,  it is common to feel soreness and discomfort. You may also see blood in your urine.  You will be told how to care for yourself after the procedure. Follow instructions on how to care for your incision and how to recognize signs of infection.  Avoid activities that require great effort. Return to activities as told by your health care provider.  Get help right away if you have blood clots in your urine, you cannot urinate, or you have trouble breathing. This information is not intended to replace advice given to you by your health care provider. Make sure you discuss any questions you have with your health care provider. Document Revised: 10/27/2017 Document Reviewed: 10/27/2017 Elsevier Patient Education  [place].
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
## 2020-07-12 ENCOUNTER — Encounter: Payer: Self-pay | Admitting: Surgery

## 2020-07-12 ENCOUNTER — Telehealth: Payer: Self-pay | Admitting: Surgery

## 2020-07-12 NOTE — Telephone Encounter (Signed)
Note has been placed up front for pt to pick up on Monday 03/28

## 2020-07-12 NOTE — Telephone Encounter (Signed)
Patient will be having inguinal hernia repair on 07/17/20 with Dr. Christian Mate and needs a note for his employer stating that he will be having surgery on this date.  He needs it asap, please call him when ready.  Thank you.

## 2020-07-12 NOTE — Telephone Encounter (Signed)
Patient calls back and he will also need the recovery period on this note as well.  Thank you.

## 2020-07-15 ENCOUNTER — Other Ambulatory Visit
Admission: RE | Admit: 2020-07-15 | Discharge: 2020-07-15 | Disposition: A | Discharge status: Home / Self Care | Payer: 59 | Source: Ambulatory Visit | Attending: Surgery | Admitting: Surgery

## 2020-07-15 ENCOUNTER — Other Ambulatory Visit: Payer: Self-pay | Admitting: Urology

## 2020-07-15 ENCOUNTER — Other Ambulatory Visit: Payer: Self-pay

## 2020-07-15 ENCOUNTER — Encounter: Payer: 59 | Admitting: Physician Assistant

## 2020-07-15 DIAGNOSIS — Z01812 Encounter for preprocedural laboratory examination: Secondary | ICD-10-CM | POA: Diagnosis present

## 2020-07-15 DIAGNOSIS — N200 Calculus of kidney: Secondary | ICD-10-CM

## 2020-07-15 DIAGNOSIS — Z20822 Contact with and (suspected) exposure to covid-19: Secondary | ICD-10-CM | POA: Insufficient documentation

## 2020-07-15 LAB — SARS CORONAVIRUS 2 (TAT 6-24 HRS): SARS Coronavirus 2: NEGATIVE

## 2020-07-16 ENCOUNTER — Other Ambulatory Visit: Payer: Self-pay | Admitting: Urology

## 2020-07-16 DIAGNOSIS — N200 Calculus of kidney: Secondary | ICD-10-CM

## 2020-07-16 MED ORDER — CHLORHEXIDINE GLUCONATE CLOTH 2 % EX PADS: 6.0000 | MEDICATED_PAD | Freq: Once | CUTANEOUS | Status: DC

## 2020-07-16 MED ORDER — ORAL CARE MOUTH RINSE: 15.0000 mL | Freq: Once | OROMUCOSAL | Status: AC

## 2020-07-16 MED ORDER — CELECOXIB 200 MG PO CAPS: 200.0000 mg | ORAL_CAPSULE | ORAL | Status: AC

## 2020-07-16 MED ORDER — BUPIVACAINE LIPOSOME 1.3 % IJ SUSP: 20.0000 mL | Freq: Once | INTRAMUSCULAR | Status: DC

## 2020-07-16 MED ORDER — LACTATED RINGERS IV SOLN: INTRAVENOUS | Status: DC

## 2020-07-16 MED ORDER — GABAPENTIN 300 MG PO CAPS: 300.0000 mg | ORAL_CAPSULE | ORAL | Status: AC

## 2020-07-16 MED ORDER — CHLORHEXIDINE GLUCONATE 0.12 % MT SOLN: 15.0000 mL | Freq: Once | OROMUCOSAL | Status: AC

## 2020-07-16 MED ORDER — CEFAZOLIN SODIUM-DEXTROSE 2-4 GM/100ML-% IV SOLN
2.0000 g | INTRAVENOUS | Status: AC
  Administered 2020-07-17: 2 g via INTRAVENOUS

## 2020-07-16 MED ORDER — FAMOTIDINE 20 MG PO TABS: 20.0000 mg | ORAL_TABLET | Freq: Once | ORAL | Status: DC

## 2020-07-16 MED ORDER — ACETAMINOPHEN 500 MG PO TABS: 1000.0000 mg | ORAL_TABLET | ORAL | Status: AC

## 2020-07-17 ENCOUNTER — Ambulatory Visit: Payer: 59 | Admitting: Anesthesiology

## 2020-07-17 ENCOUNTER — Ambulatory Visit
Admission: RE | Admit: 2020-07-17 | Discharge: 2020-07-17 | Disposition: A | Discharge status: Home / Self Care | Payer: 59 | Attending: Surgery | Admitting: Surgery

## 2020-07-17 ENCOUNTER — Encounter: Payer: Self-pay | Admitting: Surgery

## 2020-07-17 ENCOUNTER — Other Ambulatory Visit: Payer: Self-pay

## 2020-07-17 ENCOUNTER — Encounter
Admission: RE | Disposition: A | Discharge status: Home / Self Care | Payer: Self-pay | Source: Home / Self Care | Attending: Surgery

## 2020-07-17 DIAGNOSIS — K4090 Unilateral inguinal hernia, without obstruction or gangrene, not specified as recurrent: Secondary | ICD-10-CM

## 2020-07-17 DIAGNOSIS — K4020 Bilateral inguinal hernia, without obstruction or gangrene, not specified as recurrent: Secondary | ICD-10-CM | POA: Diagnosis present

## 2020-07-17 DIAGNOSIS — Z888 Allergy status to other drugs, medicaments and biological substances status: Secondary | ICD-10-CM | POA: Insufficient documentation

## 2020-07-17 DIAGNOSIS — Z79890 Hormone replacement therapy (postmenopausal): Secondary | ICD-10-CM | POA: Diagnosis not present

## 2020-07-17 DIAGNOSIS — Z79899 Other long term (current) drug therapy: Secondary | ICD-10-CM | POA: Insufficient documentation

## 2020-07-17 HISTORY — PX: XI ROBOTIC ASSISTED INGUINAL HERNIA REPAIR WITH MESH: SHX6706

## 2020-07-17 LAB — URINE DRUG SCREEN, QUALITATIVE (ARMC ONLY)
Amphetamines, Ur Screen: NOT DETECTED
Barbiturates, Ur Screen: NOT DETECTED
Benzodiazepine, Ur Scrn: NOT DETECTED
Cannabinoid 50 Ng, Ur ~~LOC~~: NOT DETECTED
Cocaine Metabolite,Ur ~~LOC~~: NOT DETECTED
MDMA (Ecstasy)Ur Screen: NOT DETECTED
Methadone Scn, Ur: NOT DETECTED
Opiate, Ur Screen: POSITIVE — AB
Phencyclidine (PCP) Ur S: NOT DETECTED
Tricyclic, Ur Screen: NOT DETECTED

## 2020-07-17 LAB — GLUCOSE, CAPILLARY: Glucose-Capillary: 103 mg/dL — ABNORMAL HIGH (ref 70–99)

## 2020-07-17 SURGERY — REPAIR, HERNIA, INGUINAL, ROBOT-ASSISTED, LAPAROSCOPIC, USING MESH
Anesthesia: General | Site: Inguinal | Laterality: Right

## 2020-07-17 MED ORDER — GABAPENTIN 300 MG PO CAPS
ORAL_CAPSULE | ORAL | Status: AC
  Administered 2020-07-17: 300 mg via ORAL
  Filled 2020-07-17: qty 1

## 2020-07-17 MED ORDER — PHENYLEPHRINE HCL (PRESSORS) 10 MG/ML IV SOLN
INTRAVENOUS | Status: DC | PRN
  Administered 2020-07-17 (×3): 100 ug via INTRAVENOUS

## 2020-07-17 MED ORDER — OXYCODONE HCL 15 MG PO TABS: 15.0000 mg | ORAL_TABLET | ORAL | 0 refills | Status: DC | PRN

## 2020-07-17 MED ORDER — FENTANYL CITRATE (PF) 100 MCG/2ML IJ SOLN
INTRAMUSCULAR | Status: AC
  Filled 2020-07-17: qty 2

## 2020-07-17 MED ORDER — IBUPROFEN 800 MG PO TABS: 800.0000 mg | ORAL_TABLET | Freq: Three times a day (TID) | ORAL | 0 refills | Status: DC | PRN

## 2020-07-17 MED ORDER — MIDAZOLAM HCL 2 MG/2ML IJ SOLN
INTRAMUSCULAR | Status: AC
  Filled 2020-07-17: qty 2

## 2020-07-17 MED ORDER — FENTANYL CITRATE (PF) 100 MCG/2ML IJ SOLN
INTRAMUSCULAR | Status: DC | PRN
  Administered 2020-07-17: 100 ug via INTRAVENOUS

## 2020-07-17 MED ORDER — PROPOFOL 10 MG/ML IV BOLUS
INTRAVENOUS | Status: AC
  Filled 2020-07-17: qty 20

## 2020-07-17 MED ORDER — SUGAMMADEX SODIUM 500 MG/5ML IV SOLN
INTRAVENOUS | Status: DC | PRN
  Administered 2020-07-17: 200 mg via INTRAVENOUS

## 2020-07-17 MED ORDER — BUPIVACAINE-EPINEPHRINE (PF) 0.25% -1:200000 IJ SOLN
INTRAMUSCULAR | Status: AC
  Filled 2020-07-17: qty 30

## 2020-07-17 MED ORDER — ACETAMINOPHEN 500 MG PO TABS
ORAL_TABLET | ORAL | Status: AC
  Administered 2020-07-17: 1000 mg via ORAL
  Filled 2020-07-17: qty 2

## 2020-07-17 MED ORDER — CEFAZOLIN SODIUM-DEXTROSE 2-4 GM/100ML-% IV SOLN
INTRAVENOUS | Status: AC
  Filled 2020-07-17: qty 100

## 2020-07-17 MED ORDER — BUPIVACAINE-EPINEPHRINE (PF) 0.25% -1:200000 IJ SOLN
INTRAMUSCULAR | Status: DC | PRN
  Administered 2020-07-17: 20 mL

## 2020-07-17 MED ORDER — CHLORHEXIDINE GLUCONATE 0.12 % MT SOLN
OROMUCOSAL | Status: AC
  Administered 2020-07-17: 15 mL via OROMUCOSAL
  Filled 2020-07-17: qty 15

## 2020-07-17 MED ORDER — ROCURONIUM BROMIDE 100 MG/10ML IV SOLN
INTRAVENOUS | Status: DC | PRN
  Administered 2020-07-17: 10 mg via INTRAVENOUS
  Administered 2020-07-17: 50 mg via INTRAVENOUS
  Administered 2020-07-17: 30 mg via INTRAVENOUS
  Administered 2020-07-17: 20 mg via INTRAVENOUS
  Administered 2020-07-17: 10 mg via INTRAVENOUS

## 2020-07-17 MED ORDER — MIDAZOLAM HCL 2 MG/2ML IJ SOLN
INTRAMUSCULAR | Status: DC | PRN
  Administered 2020-07-17: 2 mg via INTRAVENOUS

## 2020-07-17 MED ORDER — PROPOFOL 10 MG/ML IV BOLUS
INTRAVENOUS | Status: DC | PRN
  Administered 2020-07-17: 170 mg via INTRAVENOUS

## 2020-07-17 MED ORDER — ONDANSETRON HCL 4 MG/2ML IJ SOLN
INTRAMUSCULAR | Status: DC | PRN
  Administered 2020-07-17: 4 mg via INTRAVENOUS

## 2020-07-17 MED ORDER — EPHEDRINE SULFATE 50 MG/ML IJ SOLN
INTRAMUSCULAR | Status: DC | PRN
  Administered 2020-07-17 (×2): 10 mg via INTRAVENOUS
  Administered 2020-07-17: 20 mg via INTRAVENOUS

## 2020-07-17 MED ORDER — ONDANSETRON HCL 4 MG/2ML IJ SOLN: 4.0000 mg | Freq: Once | INTRAMUSCULAR | Status: DC | PRN

## 2020-07-17 MED ORDER — CELECOXIB 200 MG PO CAPS
ORAL_CAPSULE | ORAL | Status: AC
  Administered 2020-07-17: 200 mg via ORAL
  Filled 2020-07-17: qty 1

## 2020-07-17 MED ORDER — FENTANYL CITRATE (PF) 100 MCG/2ML IJ SOLN
25.0000 ug | INTRAMUSCULAR | Status: DC | PRN
  Administered 2020-07-17 (×4): 25 ug via INTRAVENOUS

## 2020-07-17 MED ORDER — LIDOCAINE HCL (CARDIAC) PF 100 MG/5ML IV SOSY
PREFILLED_SYRINGE | INTRAVENOUS | Status: DC | PRN
  Administered 2020-07-17: 80 mg via INTRAVENOUS

## 2020-07-17 SURGICAL SUPPLY — 49 items
ADH SKN CLS APL DERMABOND .7 (GAUZE/BANDAGES/DRESSINGS) ×1
APL PRP STRL LF DISP 70% ISPRP (MISCELLANEOUS) ×1
Airseal bifurcated filtered tube set ×2 IMPLANT
BLADE CLIPPER SURG (BLADE) ×2 IMPLANT
CANISTER SUCT 1200ML W/VALVE (MISCELLANEOUS) IMPLANT
CANNULA CAP OBTURATR AIRSEAL 8 (CAP) ×2 IMPLANT
CHLORAPREP W/TINT 26 (MISCELLANEOUS) ×2 IMPLANT
COVER TIP SHEARS 8 DVNC (MISCELLANEOUS) ×1 IMPLANT
COVER TIP SHEARS 8MM DA VINCI (MISCELLANEOUS) ×2
COVER WAND RF STERILE (DRAPES) ×2 IMPLANT
DEFOGGER SCOPE WARMER CLEARIFY (MISCELLANEOUS) ×2 IMPLANT
DERMABOND ADVANCED (GAUZE/BANDAGES/DRESSINGS) ×1
DERMABOND ADVANCED .7 DNX12 (GAUZE/BANDAGES/DRESSINGS) ×1 IMPLANT
DRAPE 3/4 80X56 (DRAPES) ×2 IMPLANT
DRAPE ARM DVNC X/XI (DISPOSABLE) ×3 IMPLANT
DRAPE COLUMN DVNC XI (DISPOSABLE) ×1 IMPLANT
DRAPE DA VINCI XI ARM (DISPOSABLE) ×6
DRAPE DA VINCI XI COLUMN (DISPOSABLE) ×2
ELECT REM PT RETURN 9FT ADLT (ELECTROSURGICAL) ×2
ELECTRODE REM PT RTRN 9FT ADLT (ELECTROSURGICAL) ×1 IMPLANT
GLOVE ORTHO TXT STRL SZ7.5 (GLOVE) ×6 IMPLANT
GOWN STRL REUS W/ TWL LRG LVL3 (GOWN DISPOSABLE) ×3 IMPLANT
GOWN STRL REUS W/TWL LRG LVL3 (GOWN DISPOSABLE) ×6
GRASPER SUT TROCAR 14GX15 (MISCELLANEOUS) IMPLANT
IRRIGATION STRYKERFLOW (MISCELLANEOUS) IMPLANT
IRRIGATOR STRYKERFLOW (MISCELLANEOUS)
IV CATH ANGIO 14GX1.88 NO SAFE (IV SOLUTION) ×2 IMPLANT
IV NS 1000ML (IV SOLUTION)
IV NS 1000ML BAXH (IV SOLUTION) IMPLANT
KIT PINK PAD W/HEAD ARE REST (MISCELLANEOUS) ×2
KIT PINK PAD W/HEAD ARM REST (MISCELLANEOUS) ×1 IMPLANT
LABEL OR SOLS (LABEL) ×2 IMPLANT
MANIFOLD NEPTUNE II (INSTRUMENTS) ×2 IMPLANT
MESH PROGRIP HERNIA FLAT 30X15 (Mesh General) ×2 IMPLANT
NEEDLE HYPO 22GX1.5 SAFETY (NEEDLE) ×2 IMPLANT
NEEDLE INSUFFLATION 14GA 120MM (NEEDLE) ×2 IMPLANT
PACK LAP CHOLECYSTECTOMY (MISCELLANEOUS) ×2 IMPLANT
SEAL CANN UNIV 5-8 DVNC XI (MISCELLANEOUS) ×3 IMPLANT
SEAL XI 5MM-8MM UNIVERSAL (MISCELLANEOUS) ×6
SET TUBE FILTERED XL AIRSEAL (SET/KITS/TRAYS/PACK) ×2 IMPLANT
SET TUBE SMOKE EVAC HIGH FLOW (TUBING) ×2 IMPLANT
SOLUTION ELECTROLUBE (MISCELLANEOUS) ×2 IMPLANT
SUT MNCRL 4-0 (SUTURE) ×2
SUT MNCRL 4-0 27XMFL (SUTURE) ×1
SUT VIC AB 0 CT2 27 (SUTURE) ×4 IMPLANT
SUT VLOC 90 S/L VL9 GS22 (SUTURE) ×4 IMPLANT
SUTURE MNCRL 4-0 27XMF (SUTURE) ×1 IMPLANT
TAPE TRANSPORE STRL 2 31045 (GAUZE/BANDAGES/DRESSINGS) ×2 IMPLANT
TROCAR Z-THREAD FIOS 11X100 BL (TROCAR) ×2 IMPLANT

## 2020-07-17 NOTE — Transfer of Care (Addendum)
Immediate Anesthesia Transfer of Care Note  Patient: Charles [name]  Procedure(s) Performed: XI ROBOTIC ASSISTED INGUINAL HERNIA REPAIR WITH MESH, possible bilateral (Right Inguinal)  Patient Location: PACU  Anesthesia Type:General  Level of Consciousness: awake, alert  and oriented  Airway & Oxygen Therapy: Patient Spontanous Breathing and Patient connected to face mask oxygen  Post-op Assessment: Report given to RN and Post -op Vital signs reviewed and stable  Post vital signs: Reviewed and stable  Last Vitals:  Vitals Value Taken Time  BP 107/58 07/17/20 1402  Temp 36.9 C 07/17/20 1352  Pulse 69 07/17/20 1411  Resp 5 07/17/20 1411  SpO2 93 % 07/17/20 1411  Vitals shown include unvalidated device data.  Last Pain:  Vitals:   07/17/20 1407  TempSrc:   PainSc: 10-Worst pain ever      Patients Stated Pain Goal: 0 (88/82/80 0349)  Complications: No complications documented.

## 2020-07-17 NOTE — Anesthesia Procedure Notes (Addendum)
Procedure Name: Intubation Date/Time: 07/17/2020 10:49 AM Performed by: Doreen Salvage, CRNA Pre-anesthesia Checklist: Patient identified, Patient being monitored, Timeout performed, Emergency Drugs available and Suction available Patient Re-evaluated:Patient Re-evaluated prior to induction Oxygen Delivery Method: Circle system utilized Preoxygenation: Pre-oxygenation with 100% oxygen Induction Type: IV induction Ventilation: Mask ventilation without difficulty Laryngoscope Size: Mac, McGraph and 4 Grade View: Grade I Tube type: Oral Tube size: 7.5 mm Number of attempts: 1 Airway Equipment and Method: Stylet Placement Confirmation: ETT inserted through vocal cords under direct vision,  positive ETCO2 and breath sounds checked- equal and bilateral Secured at: 22 cm Tube secured with: Tape Dental Injury: Teeth and Oropharynx as per pre-operative assessment

## 2020-07-17 NOTE — Discharge Instructions (Signed)
AMBULATORY SURGERY  °DISCHARGE INSTRUCTIONS ° ° °1) The drugs that you were given will stay in your system until tomorrow so for the next 24 hours you should not: ° °A) Drive an automobile °B) Make any legal decisions °C) Drink any alcoholic beverage ° ° °2) You may resume regular meals tomorrow.  Today it is better to start with liquids and gradually work up to solid foods. ° °You may eat anything you prefer, but it is better to start with liquids, then soup and crackers, and gradually work up to solid foods. ° ° °3) Please notify your doctor immediately if you have any unusual bleeding, trouble breathing, redness and pain at the surgery site, drainage, fever, or pain not relieved by medication. ° ° ° °4) Additional Instructions: ° ° ° ° ° ° ° °Please contact your physician with any problems or Same Day Surgery at [phone], Monday through Friday 6 am to 4 pm, or [facility] [place] Main number at [phone].AMBULATORY SURGERY  °DISCHARGE INSTRUCTIONS ° ° °5) The drugs that you were given will stay in your system until tomorrow so for the next 24 hours you should not: ° °D) Drive an automobile °E) Make any legal decisions °F) Drink any alcoholic beverage ° ° °6) You may resume regular meals tomorrow.  Today it is better to start with liquids and gradually work up to solid foods. ° °You may eat anything you prefer, but it is better to start with liquids, then soup and crackers, and gradually work up to solid foods. ° ° °7) Please notify your doctor immediately if you have any unusual bleeding, trouble breathing, redness and pain at the surgery site, drainage, fever, or pain not relieved by medication. ° ° ° °8) Additional Instructions: ° ° ° ° ° ° ° °Please contact your physician with any problems or Same Day Surgery at [phone], Monday through Friday 6 am to 4 pm, or [facility] Main number at [phone]. °

## 2020-07-17 NOTE — Op Note (Signed)
Robotic assisted Laparoscopic Transabdominal  bilateral inguinal Hernia Repair with Mesh       Pre-operative Diagnosis:   Right inguinal Hernia   Post-operative Diagnosis: Bilateral indirect inguinal hernias   Procedure: Robotic assisted Laparoscopic  repair of bilateral inguinal hernia(s)   Surgeon: Ronny Bacon, M.D., FACS   Anesthesia: GETA   Findings:  Large right inguinal hernia with multiple loops of bowel and hernia sac, large left-sided hernia, both with marked inflammatory changes of the medial aspects of internal ring.         Procedure Details  The patient was seen again in the Holding Room. The benefits, complications, treatment options, and expected outcomes were discussed with the patient. The risks of bleeding, infection, recurrence of symptoms, failure to resolve symptoms, recurrence of hernia, ischemic orchitis, chronic pain syndrome or neuroma, were reviewed again. The likelihood of improving the patient's symptoms with return to their baseline status is good.  The patient and/or family concurred with the proposed plan, giving informed consent.  The patient was taken to Operating Room, identified  and the procedure verified as Laparoscopic Inguinal Hernia Repair. Laterality confirmed.  A Time Out was held and the above information confirmed.   Prior to the induction of general anesthesia, antibiotic prophylaxis was administered. VTE prophylaxis was in place. General endotracheal anesthesia was then administered and tolerated well. After the induction, the abdomen was prepped with Chloraprep and draped in the sterile fashion. The patient was positioned in the supine position.   After local infiltration of quarter percent Marcaine with epinephrine, stab incision was made left upper quadrant.  Just below the costal margin approximately midclavicular line the Veress needle is passed with sensation of the layers to penetrate the abdominal wall and into the peritoneum.  Saline  drop test is confirmed peritoneal placement.  Insufflation is initiated with carbon dioxide to pressures of 15 mmHg.  Air seal was utilized for insufflation during this procedure.  An 8.5 mm port is placed in the right upper quadrant, with blunt tipped trocar.  Pneumoperitoneum maintained w/o HD changes. No evidence of bowel injuries.  No evidence of upper abdominal scarring requiring lysis of adhesions. Two 8.5 mm ports placed under direct vision, near the midline and the left upper quadrant. The laparoscopy revealed large bilateral indirect defect(s).   The robot was brought ot the table and docked in the standard fashion, no collision between arms was observed. Instruments were kept under direct view at all times. Patient was placed in Trendelenburg to 20 degrees, the right sided hernia had multiple loops of bowel within it these were all reduced without evidence of adhesions, obstruction or bowel compromise.     For bilateral inguinal hernia repair,  I developed a peritoneal flap. The sac(s) were reduced and dissected free from adjacent structures.  Due to the extent of the sac on the right I needed to transect it within the deep groin.  Both hernias had marked scarring/inflammatory changes of the medial aspects of the internal rings.  We preserved the vas and the vessels, and visualized them to their convergence and beyond in the retroperitoneum. Once dissection was completed I elected to customize a 15 cm x 30 cm ProGrip mesh, hoping to maximize the gripping strength posteriorly and laterally. This was placed across the symphysis pubis extending from left to right and unfurled anterior and posterior to provide adequate coverage.    However the posterior gripping was not as I had hoped. The progrip was secured at three points with interrupted  2-0 Vicryl to the pubic tubercle and anteriorly, at the lateralmost aspect. There was good coverage of the direct, indirect and femoral spaces.  Second  look revealed no complications or injuries.  The flap was then closed with two 9"  2-0 V-lock sutures.    Peritoneal closure completed with vicryl sutures without appreciable defect.   Once assuring that hemostasis was adequate, all needles/sponges removed, and the robot was undocked.  The ports were removed, the abdomen desulflated.  4-0 subcuticular Monocryl was used at all skin edges. Dermabond was placed.  Patient tolerated the procedure well. There were no complications. He was taken to the recovery room in stable condition.           Ronny Bacon, M.D., FACS 07/17/2020, 1:47 PM
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
## 2020-07-17 NOTE — Interval H&P Note (Signed)
History and Physical Interval Note:  07/17/2020 10:24 AM  [name]  has presented today for surgery, with the diagnosis of right inguinal hernia.  The various methods of treatment have been discussed with the patient and family. After consideration of risks, benefits and other options for treatment, the patient has consented to  Procedure(s): XI ROBOTIC [place], possible bilateral (Right) as a surgical intervention.  The patient's history has been reviewed, patient examined, no change in status, stable for surgery.  I have reviewed the patient's chart and labs.  Questions were answered to the patient's satisfaction.    The right side is marked.  Ronny Bacon  Ronny Bacon, M.D., [facility]  07/17/2020 ; 10:24 AM

## 2020-07-18 ENCOUNTER — Encounter: Payer: Self-pay | Admitting: Surgery

## 2020-07-19 ENCOUNTER — Encounter: Payer: Self-pay | Admitting: Urology

## 2020-07-24 NOTE — Anesthesia Postprocedure Evaluation (Signed)
Anesthesia Post Note  Patient: Charles [name]  Procedure(s) Performed: XI ROBOTIC ASSISTED INGUINAL HERNIA REPAIR WITH MESH, possible bilateral (Right Inguinal)  Patient location during evaluation: PACU Anesthesia Type: General Level of consciousness: awake and alert Pain management: pain level controlled Vital Signs Assessment: post-procedure vital signs reviewed and stable Respiratory status: spontaneous breathing, nonlabored ventilation, respiratory function stable and patient connected to nasal cannula oxygen Cardiovascular status: blood pressure returned to baseline and stable Postop Assessment: no apparent nausea or vomiting Anesthetic complications: no   No complications documented.   Last Vitals:  Vitals:   07/17/20 1445 07/17/20 1540  BP: 121/74 110/62  Pulse: 73 73  Resp: 18 18  Temp: 36.6 C 36.7 C  SpO2: 96% 97%    Last Pain:  Vitals:   07/18/20 0906  TempSrc:   PainSc: 2                  Molli Barrows

## 2020-07-25 ENCOUNTER — Encounter: Payer: 59 | Admitting: Physician Assistant

## 2020-08-01 ENCOUNTER — Encounter: Payer: Self-pay | Admitting: Surgery

## 2020-08-01 ENCOUNTER — Ambulatory Visit (INDEPENDENT_AMBULATORY_CARE_PROVIDER_SITE_OTHER): Payer: 59 | Admitting: Surgery

## 2020-08-01 ENCOUNTER — Other Ambulatory Visit: Payer: Self-pay

## 2020-08-01 ENCOUNTER — Other Ambulatory Visit: Payer: Self-pay | Admitting: Hospice and Palliative Medicine

## 2020-08-01 VITALS — BP 133/77 | HR 80 | Temp 98.0°F | Ht 73.0 in | Wt 251.8 lb

## 2020-08-01 DIAGNOSIS — Z8719 Personal history of other diseases of the digestive system: Secondary | ICD-10-CM

## 2020-08-01 DIAGNOSIS — Z9889 Other specified postprocedural states: Secondary | ICD-10-CM

## 2020-08-01 DIAGNOSIS — G4719 Other hypersomnia: Secondary | ICD-10-CM

## 2020-08-01 MED ORDER — OXYCODONE HCL 5 MG PO TABS: 5.0000 mg | ORAL_TABLET | Freq: Four times a day (QID) | ORAL | 0 refills | Status: DC | PRN
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
## 2020-08-01 NOTE — Telephone Encounter (Signed)
Can you please review 

## 2020-08-01 NOTE — Patient Instructions (Addendum)
See your follow up appointment below. If you have any concerns or questions, please feel free to call our office.     GENERAL POST-OPERATIVE PATIENT INSTRUCTIONS   WOUND CARE INSTRUCTIONS:  Keep a dry clean dressing on the wound if there is drainage. The initial bandage may be removed after 24 hours.  Once the wound has quit draining you may leave it open to air.  If clothing rubs against the wound or causes irritation and the wound is not draining you may cover it with a dry dressing during the daytime.  Try to keep the wound dry and avoid ointments on the wound unless directed to do so.  If the wound becomes bright red and painful or starts to drain infected material that is not clear, please contact your physician immediately.  If the wound is mildly pink and has a thick firm ridge underneath it, this is normal, and is referred to as a healing ridge.  This will resolve over the next 4-6 weeks.  BATHING: You may shower if you have been informed of this by your surgeon. However, Please do not submerge in a tub, hot tub, or pool until incisions are completely sealed or have been told by your surgeon that you may do so.  DIET:  You may eat any foods that you can tolerate.  It is a good idea to eat a high fiber diet and take in plenty of fluids to prevent constipation.  If you do become constipated you may want to take a mild laxative or take ducolax tablets on a daily basis until your bowel habits are regular.  Constipation can be very uncomfortable, along with straining, after recent surgery.  ACTIVITY:  You are encouraged to cough and deep breath or use your incentive spirometer if you were given one, every 15-30 minutes when awake.  This will help prevent respiratory complications and low grade fevers post-operatively if you had a general anesthetic.  You may want to hug a pillow when coughing and sneezing to add additional support to the surgical area, if you had abdominal or chest surgery, which  will decrease pain during these times.  You are encouraged to walk and engage in light activity for the next two weeks.  You should not lift more than 15 pounds, until 08/28/2020 as it could put you at increased risk for complications.  Twenty pounds is roughly equivalent to a plastic bag of groceries. At that time- Listen to your body when lifting, if you have pain when lifting, stop and then try again in a few days. Soreness after doing exercises or activities of daily living is normal as you get back in to your normal routine.  MEDICATIONS:  Try to take narcotic medications and anti-inflammatory medications, such as tylenol, ibuprofen, naprosyn, etc., with food.  This will minimize stomach upset from the medication.  Should you develop nausea and vomiting from the pain medication, or develop a rash, please discontinue the medication and contact your physician.  You should not drive, make important decisions, or operate machinery when taking narcotic pain medication.  SUNBLOCK Use sun block to incision area over the next year if this area will be exposed to sun. This helps decrease scarring and will allow you avoid a permanent darkened area over your incision.

## 2020-08-07 ENCOUNTER — Other Ambulatory Visit: Payer: Self-pay | Admitting: Nurse Practitioner

## 2020-08-07 ENCOUNTER — Other Ambulatory Visit: Payer: Self-pay | Admitting: Hospice and Palliative Medicine

## 2020-08-07 DIAGNOSIS — F329 Major depressive disorder, single episode, unspecified: Secondary | ICD-10-CM

## 2020-08-07 DIAGNOSIS — F5101 Primary insomnia: Secondary | ICD-10-CM

## 2020-08-07 DIAGNOSIS — F411 Generalized anxiety disorder: Secondary | ICD-10-CM

## 2020-08-07 NOTE — Telephone Encounter (Signed)
Last appt 3/22 and next 08/12/20

## 2020-08-08 ENCOUNTER — Other Ambulatory Visit: Payer: Self-pay | Admitting: Hospice and Palliative Medicine

## 2020-08-08 DIAGNOSIS — F411 Generalized anxiety disorder: Secondary | ICD-10-CM

## 2020-08-08 DIAGNOSIS — F329 Major depressive disorder, single episode, unspecified: Secondary | ICD-10-CM

## 2020-08-09 ENCOUNTER — Telehealth: Payer: Self-pay | Admitting: Urology

## 2020-08-09 NOTE — Telephone Encounter (Signed)
Called pt. To give arrival time to IR and cancel his upcoming covid test. Pt. Informed me that he is not healing well from previous General Surgery and he may cancel his PCNL. Pt. Would like for me to call him back the end of next week to talk about the surgery.

## 2020-08-12 ENCOUNTER — Ambulatory Visit (INDEPENDENT_AMBULATORY_CARE_PROVIDER_SITE_OTHER): Payer: 59 | Admitting: Physician Assistant

## 2020-08-12 ENCOUNTER — Encounter: Payer: Self-pay | Admitting: Physician Assistant

## 2020-08-12 ENCOUNTER — Other Ambulatory Visit: Payer: Self-pay

## 2020-08-12 DIAGNOSIS — Z0001 Encounter for general adult medical examination with abnormal findings: Secondary | ICD-10-CM

## 2020-08-12 DIAGNOSIS — L97519 Non-pressure chronic ulcer of other part of right foot with unspecified severity: Secondary | ICD-10-CM

## 2020-08-12 DIAGNOSIS — F329 Major depressive disorder, single episode, unspecified: Secondary | ICD-10-CM

## 2020-08-12 DIAGNOSIS — I10 Essential (primary) hypertension: Secondary | ICD-10-CM

## 2020-08-12 DIAGNOSIS — R300 Dysuria: Secondary | ICD-10-CM

## 2020-08-12 DIAGNOSIS — G4719 Other hypersomnia: Secondary | ICD-10-CM

## 2020-08-12 DIAGNOSIS — N200 Calculus of kidney: Secondary | ICD-10-CM

## 2020-08-12 DIAGNOSIS — F411 Generalized anxiety disorder: Secondary | ICD-10-CM | POA: Diagnosis not present

## 2020-08-12 DIAGNOSIS — G894 Chronic pain syndrome: Secondary | ICD-10-CM

## 2020-08-12 MED ORDER — LISDEXAMFETAMINE DIMESYLATE 40 MG PO CAPS: 40.0000 mg | ORAL_CAPSULE | Freq: Every morning | ORAL | 0 refills | Status: DC

## 2020-08-12 MED ORDER — DULOXETINE HCL 60 MG PO CPEP
60.0000 mg | ORAL_CAPSULE | Freq: Every day | ORAL | 2 refills | Status: DC
Start: 2020-08-12 — End: 2020-09-11
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
## 2020-08-13 LAB — MICROSCOPIC EXAMINATION
Bacteria, UA: NONE SEEN
Casts: NONE SEEN /lpf
RBC, Urine: NONE SEEN /hpf (ref 0–2)

## 2020-08-13 LAB — UA/M W/RFLX CULTURE, ROUTINE
Bilirubin, UA: NEGATIVE
Glucose, UA: NEGATIVE
Ketones, UA: NEGATIVE
Leukocytes,UA: NEGATIVE
Nitrite, UA: NEGATIVE
RBC, UA: NEGATIVE
Specific Gravity, UA: 1.019 (ref 1.005–1.030)
Urobilinogen, Ur: 0.2 mg/dL (ref 0.2–1.0)
pH, UA: 5.5 (ref 5.0–7.5)

## 2020-08-16 ENCOUNTER — Inpatient Hospital Stay
Admission: RE | Admit: 2020-08-16 | Discharge: 2020-08-16 | Disposition: A | Discharge status: Home / Self Care | Payer: 59 | Source: Ambulatory Visit

## 2020-08-16 NOTE — Pre-Procedure Instructions (Signed)
This Probation officer called patient for his pre admission phone call, patient voiced to this writer that he is not going to proceed with his surgery scheduled on 08/26/20 , he voices that he is not over his last surgery and now has a painful wound on the site where he just had a toe amputated. This Probation officer informed patient to call Dr. Cherrie Gauze office to make them aware of his wishes and to call PCP to follow up with any symptoms. This Probation officer sent Dr. Erlene Quan a secure chat message to make her aware.

## 2020-08-16 NOTE — Patient Instructions (Signed)
Your procedure is scheduled on: 08/26/20 - Monday Report to the Registration Desk on the 1st floor of the [facility]. To find out your arrival time, please call [phone] between 1PM - 3PM on: 08/23/20 - Friday  Report to [facility] 08/22/20 at 9 am for Covid test and Labs  REMEMBER: Instructions that are not followed completely may result in serious medical risk, up to and including death; or upon the discretion of your surgeon and anesthesiologist your surgery may need to be rescheduled.  Do not eat food or drink any fluids after midnight the night before surgery.  No gum chewing, lozengers or hard candies.  TAKE THESE MEDICATIONS THE MORNING OF SURGERY WITH A SIP OF WATER:  - amLODipine (NORVASC) 5 MG tablet - buPROPion (WELLBUTRIN XL) 150 MG 24 hr tablet - carvedilol (COREG) 12.5 MG tablet - DULoxetine (CYMBALTA) 60 MG capsule  - levothyroxine (SYNTHROID) 75 MCG tablet - lisdexamfetamine (VYVANSE) 40 MG capsule - Morphine Sulfate ER 30 MG T12A - naloxegol oxalate (MOVANTIK) 25 MG TABS tablet - tamsulosin (FLOMAX) 0.4 MG CAPS capsule  Use albuterol (PROVENTIL HFA;VENTOLIN HFA) 108 (90 Base) MCG/ACT inhaler on the day of surgery and bring to the hospital.  One week prior to surgery: Stop Anti-inflammatories (NSAIDS) such as Advil, Aleve, Ibuprofen, Motrin, Naproxen, Naprosyn and Aspirin based products such as Excedrin, Goodys Powder, BC Powder.  Stop for 1 week  ANY OVER THE COUNTER supplements until after surgery.  No Alcohol for 24 hours before or after surgery.  No Smoking including e-cigarettes for 24 hours prior to surgery.  No chewable tobacco products for at least 6 hours prior to surgery.  No nicotine patches on the day of surgery.  Do not use any "recreational" drugs for at least a week prior to your surgery.  Please be advised that the combination of cocaine and anesthesia may have negative outcomes, up to and including death. If you test positive for  cocaine, your surgery will be cancelled.  On the morning of surgery brush your teeth with toothpaste and water, you may rinse your mouth with mouthwash if you wish. Do not swallow any toothpaste or mouthwash.  Do not wear jewelry, make-up, hairpins, clips or nail polish.  Do not wear lotions, powders, or perfumes.   Do not shave body from the neck down 48 hours prior to surgery just in case you cut yourself which could leave a site for infection.  Also, freshly shaved skin may become irritated if using the CHG soap.  Contact lenses, hearing aids and dentures may not be worn into surgery.  Do not bring valuables to the hospital. [facility] is not responsible for any missing/lost belongings or valuables.   Use CHG Soap or wipes as directed on instruction sheet.  Bring your C-PAP to the hospital with you in case you may have to spend the night.   Notify your doctor if there is any change in your medical condition (cold, fever, infection).  Wear comfortable clothing (specific to your surgery type) to the hospital.  Plan for stool softeners for home use; pain medications have a tendency to cause constipation. You can also help prevent constipation by eating foods high in fiber such as fruits and vegetables and drinking plenty of fluids as your diet allows.  After surgery, you can help prevent lung complications by doing breathing exercises.  Take deep breaths and cough every 1-2 hours. Your doctor may order a device called an Incentive Spirometer to help you take  deep breaths. When coughing or sneezing, hold a pillow firmly against your incision with both hands. This is called "splinting." Doing this helps protect your incision. It also decreases belly discomfort.  If you are being admitted to the hospital overnight, leave your suitcase in the car. After surgery it may be brought to your room.  If you are being discharged the day of surgery, you will not be allowed to drive home. You  will need a responsible adult (18 years or older) to drive you home and stay with you that night.   If you are taking public transportation, you will need to have a responsible adult (18 years or older) with you. Please confirm with your physician that it is acceptable to use public transportation.   Please call the [facility] at [phone] if you have any questions about these instructions.  Surgery Visitation Policy:  Patients undergoing a surgery or procedure may have one family member or support person with them as long as that person is not COVID-19 positive or experiencing its symptoms.  That person may remain in the waiting area during the procedure.  Inpatient Visitation:    Visiting hours are 7 a.m. to 8 p.m. Inpatients will be allowed two visitors daily. The visitors may change each day during the patient's stay. No visitors under the age of 72. Any visitor under the age of 74 must be accompanied by an adult. The visitor must pass COVID-19 screenings, use hand sanitizer when entering and exiting the patient's room and wear a mask at all times, including in the patient's room. Patients must also wear a mask when staff or their visitor are in the room. Masking is required regardless of vaccination status.

## 2020-08-22 ENCOUNTER — Inpatient Hospital Stay: Admission: RE | Admit: 2020-08-22 | Payer: 59 | Source: Ambulatory Visit

## 2020-08-26 ENCOUNTER — Ambulatory Visit: Payer: 59

## 2020-08-27 ENCOUNTER — Telehealth: Payer: Self-pay

## 2020-08-27 NOTE — Telephone Encounter (Signed)
Psy referral faxed to [facility]. Per receptionist, they do not take faxed referrals, patient needs to call to schedule appointment. Notified patient. Gave patient telephone # to call and schedule. He will call me back with date & time.

## 2020-09-03 ENCOUNTER — Encounter: Payer: 59 | Admitting: Surgery

## 2020-09-05 ENCOUNTER — Other Ambulatory Visit: Payer: Self-pay

## 2020-09-05 ENCOUNTER — Encounter: Payer: Self-pay | Admitting: Surgery

## 2020-09-05 ENCOUNTER — Ambulatory Visit (INDEPENDENT_AMBULATORY_CARE_PROVIDER_SITE_OTHER): Payer: 59 | Admitting: Surgery

## 2020-09-05 VITALS — BP 131/79 | HR 71 | Temp 98.3°F | Ht 73.0 in | Wt 249.2 lb

## 2020-09-05 DIAGNOSIS — Z8719 Personal history of other diseases of the digestive system: Secondary | ICD-10-CM

## 2020-09-05 DIAGNOSIS — Z9889 Other specified postprocedural states: Secondary | ICD-10-CM

## 2020-09-05 NOTE — Patient Instructions (Signed)
GENERAL POST-OPERATIVE PATIENT INSTRUCTIONS   WOUND CARE INSTRUCTIONS:  Keep a dry clean dressing on the wound if there is drainage. The initial bandage may be removed after 24 hours.  Once the wound has quit draining you may leave it open to air.  If clothing rubs against the wound or causes irritation and the wound is not draining you may cover it with a dry dressing during the daytime.  Try to keep the wound dry and avoid ointments on the wound unless directed to do so.  If the wound becomes bright red and painful or starts to drain infected material that is not clear, please contact your physician immediately.  If the wound is mildly pink and has a thick firm ridge underneath it, this is normal, and is referred to as a healing ridge.  This will resolve over the next 4-6 weeks.  BATHING: You may shower if you have been informed of this by your surgeon. However, Please do not submerge in a tub, hot tub, or pool until incisions are completely sealed or have been told by your surgeon that you may do so.  DIET:  You may eat any foods that you can tolerate.  It is a good idea to eat a high fiber diet and take in plenty of fluids to prevent constipation.  If you do become constipated you may want to take a mild laxative or take ducolax tablets on a daily basis until your bowel habits are regular.  Constipation can be very uncomfortable, along with straining, after recent surgery.  ACTIVITY:  You are encouraged to cough and deep breath or use your incentive spirometer if you were given one, every 15-30 minutes when awake.  This will help prevent respiratory complications and low grade fevers post-operatively if you had a general anesthetic.  You may want to hug a pillow when coughing and sneezing to add additional support to the surgical area, if you had abdominal or chest surgery, which will decrease pain during these times.  You are encouraged to walk and engage in light activity for the next two weeks.  You  should not lift more than 20 pounds for 6 weeks as it could put you at increased risk for complications.  Twenty pounds is roughly equivalent to a plastic bag of groceries. At that time- Listen to your body when lifting, if you have pain when lifting, stop and then try again in a few days. Soreness after doing exercises or activities of daily living is normal as you get back in to your normal routine.  MEDICATIONS:  Try to take narcotic medications and anti-inflammatory medications, such as tylenol, ibuprofen, naprosyn, etc., with food.  This will minimize stomach upset from the medication.  Should you develop nausea and vomiting from the pain medication, or develop a rash, please discontinue the medication and contact your physician.  You should not drive, make important decisions, or operate machinery when taking narcotic pain medication.  SUNBLOCK Use sun block to incision area over the next year if this area will be exposed to sun. This helps decrease scarring and will allow you avoid a permanent darkened area over your incision.  QUESTIONS:  Please feel free to call our office if you have any questions, and we will be glad to assist you.     
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
## 2020-09-09 ENCOUNTER — Other Ambulatory Visit: Payer: Self-pay | Admitting: Physician Assistant

## 2020-09-09 DIAGNOSIS — G4719 Other hypersomnia: Secondary | ICD-10-CM

## 2020-09-09 NOTE — Telephone Encounter (Signed)
Please review and fill if appropriate.  Pt did call this morning requesting refill

## 2020-09-10 ENCOUNTER — Other Ambulatory Visit: Payer: Self-pay | Admitting: Internal Medicine

## 2020-09-10 DIAGNOSIS — F5101 Primary insomnia: Secondary | ICD-10-CM

## 2020-09-11 ENCOUNTER — Other Ambulatory Visit: Payer: Self-pay

## 2020-09-11 DIAGNOSIS — F331 Major depressive disorder, recurrent, moderate: Secondary | ICD-10-CM

## 2020-09-11 DIAGNOSIS — R110 Nausea: Secondary | ICD-10-CM

## 2020-09-11 DIAGNOSIS — F329 Major depressive disorder, single episode, unspecified: Secondary | ICD-10-CM

## 2020-09-11 DIAGNOSIS — E039 Hypothyroidism, unspecified: Secondary | ICD-10-CM

## 2020-09-11 DIAGNOSIS — F411 Generalized anxiety disorder: Secondary | ICD-10-CM

## 2020-09-11 DIAGNOSIS — I10 Essential (primary) hypertension: Secondary | ICD-10-CM

## 2020-09-11 DIAGNOSIS — E782 Mixed hyperlipidemia: Secondary | ICD-10-CM

## 2020-09-11 DIAGNOSIS — K5903 Drug induced constipation: Secondary | ICD-10-CM

## 2020-09-11 MED ORDER — LEVOTHYROXINE SODIUM 75 MCG PO TABS: 75.0000 ug | ORAL_TABLET | Freq: Every day | ORAL | 3 refills | Status: DC

## 2020-09-11 MED ORDER — NALOXEGOL OXALATE 25 MG PO TABS: 25.0000 mg | ORAL_TABLET | Freq: Every day | ORAL | 3 refills | Status: DC

## 2020-09-11 MED ORDER — CARVEDILOL 12.5 MG PO TABS: 12.5000 mg | ORAL_TABLET | Freq: Two times a day (BID) | ORAL | 1 refills | Status: DC

## 2020-09-11 MED ORDER — BUSPIRONE HCL 30 MG PO TABS: 30.0000 mg | ORAL_TABLET | Freq: Three times a day (TID) | ORAL | 2 refills | Status: DC | PRN

## 2020-09-11 MED ORDER — DULOXETINE HCL 60 MG PO CPEP: 60.0000 mg | ORAL_CAPSULE | Freq: Every day | ORAL | 2 refills | Status: DC

## 2020-09-11 MED ORDER — ATORVASTATIN CALCIUM 40 MG PO TABS: ORAL_TABLET | ORAL | 1 refills | Status: DC

## 2020-09-11 MED ORDER — BUPROPION HCL ER (XL) 150 MG PO TB24: 150.0000 mg | ORAL_TABLET | Freq: Every day | ORAL | 1 refills | Status: DC

## 2020-09-11 MED ORDER — ONDANSETRON HCL 8 MG PO TABS: ORAL_TABLET | ORAL | 2 refills | Status: DC

## 2020-09-11 MED ORDER — FERROUS SULFATE 325 (65 FE) MG PO TABS: 325.0000 mg | ORAL_TABLET | Freq: Every day | ORAL | 1 refills | Status: DC

## 2020-09-11 MED ORDER — AMLODIPINE BESYLATE 5 MG PO TABS: 5.0000 mg | ORAL_TABLET | Freq: Every day | ORAL | 1 refills | Status: DC

## 2020-09-11 MED ORDER — SILDENAFIL CITRATE 20 MG PO TABS: 20.0000 mg | ORAL_TABLET | Freq: Every day | ORAL | 3 refills | Status: DC | PRN

## 2020-09-23 ENCOUNTER — Telehealth: Payer: Self-pay

## 2020-09-23 NOTE — Telephone Encounter (Signed)
Left patient another vm to return my call regarding referral-Toni

## 2020-09-24 ENCOUNTER — Ambulatory Visit: Payer: 59 | Admitting: Urology

## 2020-10-08 ENCOUNTER — Other Ambulatory Visit: Payer: Self-pay | Admitting: Anesthesiology

## 2020-10-08 ENCOUNTER — Encounter: Payer: 59 | Admitting: Surgery

## 2020-10-08 DIAGNOSIS — M961 Postlaminectomy syndrome, not elsewhere classified: Secondary | ICD-10-CM

## 2020-10-10 ENCOUNTER — Encounter: Payer: Self-pay | Admitting: Surgery

## 2020-10-10 ENCOUNTER — Other Ambulatory Visit: Payer: Self-pay | Admitting: Internal Medicine

## 2020-10-10 ENCOUNTER — Inpatient Hospital Stay: Admission: RE | Admit: 2020-10-10 | Payer: 59 | Source: Ambulatory Visit

## 2020-10-10 ENCOUNTER — Other Ambulatory Visit: Payer: Self-pay | Admitting: Physician Assistant

## 2020-10-10 ENCOUNTER — Ambulatory Visit (INDEPENDENT_AMBULATORY_CARE_PROVIDER_SITE_OTHER): Payer: 59 | Admitting: Surgery

## 2020-10-10 ENCOUNTER — Other Ambulatory Visit: Payer: Self-pay

## 2020-10-10 ENCOUNTER — Telehealth: Payer: Self-pay

## 2020-10-10 VITALS — BP 127/76 | HR 73 | Temp 98.2°F | Ht 73.0 in | Wt 243.2 lb

## 2020-10-10 DIAGNOSIS — G4719 Other hypersomnia: Secondary | ICD-10-CM

## 2020-10-10 DIAGNOSIS — F5101 Primary insomnia: Secondary | ICD-10-CM

## 2020-10-10 DIAGNOSIS — Z8719 Personal history of other diseases of the digestive system: Secondary | ICD-10-CM

## 2020-10-10 DIAGNOSIS — Z9889 Other specified postprocedural states: Secondary | ICD-10-CM

## 2020-10-10 MED ORDER — ZOLPIDEM TARTRATE 10 MG PO TABS: 10.0000 mg | ORAL_TABLET | Freq: Every day | ORAL | 0 refills | Status: DC
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
## 2020-10-10 NOTE — Telephone Encounter (Signed)
LMOM that some refills were done by Lauren and to check with pharmacy

## 2020-10-14 ENCOUNTER — Ambulatory Visit: Payer: 59

## 2020-10-22 ENCOUNTER — Ambulatory Visit: Payer: 59

## 2020-10-23 NOTE — Progress Notes (Signed)
Patient on schedule for PCNL tube placement 10/28/2020, spoke with patient on phone with pre procedure instructions given. Made aware to call SDS on Friday after 1300 if not already contacted by SDS of time to arrive, NPO after Mn prior to procedure. Stated understanding.

## 2020-10-24 ENCOUNTER — Other Ambulatory Visit: Admission: RE | Admit: 2020-10-24 | Payer: 59 | Source: Ambulatory Visit

## 2020-10-24 ENCOUNTER — Other Ambulatory Visit: Payer: Self-pay | Admitting: Urology

## 2020-10-24 DIAGNOSIS — N200 Calculus of kidney: Secondary | ICD-10-CM

## 2020-10-28 ENCOUNTER — Ambulatory Visit: Payer: 59

## 2020-10-28 ENCOUNTER — Ambulatory Visit: Admission: RE | Admit: 2020-10-28 | Payer: 59 | Source: Ambulatory Visit | Admitting: Urology

## 2020-10-28 ENCOUNTER — Encounter: Admission: RE | Payer: Self-pay | Source: Ambulatory Visit

## 2020-10-28 ENCOUNTER — Ambulatory Visit
Admission: RE | Admit: 2020-10-28 | Discharge: 2020-10-28 | Disposition: A | Discharge status: Home / Self Care | Payer: 59 | Source: Ambulatory Visit | Attending: Urology | Admitting: Urology

## 2020-10-28 SURGERY — NEPHROLITHOTOMY PERCUTANEOUS
Anesthesia: General | Laterality: Right

## 2020-10-31 ENCOUNTER — Other Ambulatory Visit: Payer: Self-pay | Admitting: Urology

## 2020-10-31 DIAGNOSIS — N200 Calculus of kidney: Secondary | ICD-10-CM

## 2020-11-06 ENCOUNTER — Other Ambulatory Visit: Payer: Self-pay

## 2020-11-06 ENCOUNTER — Other Ambulatory Visit
Admission: RE | Admit: 2020-11-06 | Discharge: 2020-11-06 | Disposition: A | Discharge status: Home / Self Care | Payer: 59 | Source: Ambulatory Visit | Attending: Urology | Admitting: Urology

## 2020-11-06 ENCOUNTER — Encounter: Payer: Self-pay | Admitting: Urology

## 2020-11-06 HISTORY — DX: Gout, unspecified: M10.9

## 2020-11-06 NOTE — Patient Instructions (Addendum)
Your procedure is scheduled on: Friday, July 29 Report to the Registration Desk on the 1st floor of the [facility]. To find out your arrival time, please call [phone] between 1PM - 3PM on: Thursday, July 28  REMEMBER: Instructions that are not followed completely may result in serious medical risk, up to and including death; or upon the discretion of your surgeon and anesthesiologist your surgery may need to be rescheduled.  Do not eat or drink anything after midnight the night before surgery.  No gum chewing, lozengers or hard candies.  TAKE THESE MEDICATIONS THE MORNING OF SURGERY WITH A SIP OF WATER:  Albuterol inhaler Buspirone Carvedilol Duloxetine Levothyroxine Morphine sulfate Bupropion   Use inhalers on the day of surgery and bring to the hospital.  One week prior to surgery: starting July 22 Stop ASPIRIN and Anti-inflammatories (NSAIDS) such as Advil, Aleve, Ibuprofen, Motrin, Naproxen, Naprosyn and Aspirin based products such as Excedrin, Goodys Powder, BC Powder. Stop ANY and ALL OVER THE COUNTER supplements until after surgery.  You may however, continue to take Tylenol if needed for pain up until the day of surgery.  No Alcohol for 24 hours before or after surgery.  No Smoking including e-cigarettes for 24 hours prior to surgery.  No chewable tobacco products for at least 6 hours prior to surgery.  No nicotine patches on the day of surgery.  Do not use any "recreational" drugs for at least a week prior to your surgery.  Please be advised that the combination of cocaine and anesthesia may have negative outcomes, up to and including death. If you test positive for cocaine, your surgery will be cancelled.  On the morning of surgery brush your teeth with toothpaste and water, you may rinse your mouth with mouthwash if you wish. Do not swallow any toothpaste or mouthwash.  Do not wear jewelry, make-up, hairpins, clips or nail polish.  Do not wear lotions,  powders, or perfumes.   Do not shave body from the neck down 48 hours prior to surgery just in case you cut yourself which could leave a site for infection.  Also, freshly shaved skin may become irritated if using the CHG soap.  Contact lenses, hearing aids and dentures may not be worn into surgery.  Do not bring valuables to the hospital. [facility] is not responsible for any missing/lost belongings or valuables.   Use CHG Soap as directed on instruction sheet.  Notify your doctor if there is any change in your medical condition (cold, fever, infection).  Wear comfortable clothing (specific to your surgery type) to the hospital.  After surgery, you can help prevent lung complications by doing breathing exercises.  Take deep breaths and cough every 1-2 hours. Your doctor may order a device called an Incentive Spirometer to help you take deep breaths.  If you are being admitted to the hospital overnight, leave your suitcase in the car. After surgery it may be brought to your room.  If you are being discharged the day of surgery, you will not be allowed to drive home. You will need a responsible adult (18 years or older) to drive you home and stay with you that night.   If you are taking public transportation, you will need to have a responsible adult (18 years or older) with you. Please confirm with your physician that it is acceptable to use public transportation.   Please call the [facility] Dept. at [phone] if you have any questions about these instructions.  Surgery Visitation Policy:  Patients undergoing a surgery or procedure may have one family member or support person with them as long as that person is not COVID-19 positive or experiencing its symptoms.  That person may remain in the waiting area during the procedure.  Inpatient Visitation:    Visiting hours are 7 a.m. to 8 p.m. Inpatients will be allowed two visitors daily. The visitors may change  each day during the patient's stay. No visitors under the age of 20. Any visitor under the age of 67 must be accompanied by an adult. The visitor must pass COVID-19 screenings, use hand sanitizer when entering and exiting the patient's room and wear a mask at all times, including in the patient's room. Patients must also wear a mask when staff or their visitor are in the room. Masking is required regardless of vaccination status.

## 2020-11-08 ENCOUNTER — Encounter: Payer: Self-pay | Admitting: *Deleted

## 2020-11-08 ENCOUNTER — Ambulatory Visit: Payer: 59 | Admitting: Physician Assistant

## 2020-11-08 ENCOUNTER — Other Ambulatory Visit: Payer: Self-pay

## 2020-11-08 ENCOUNTER — Encounter: Payer: Self-pay | Admitting: Physician Assistant

## 2020-11-08 DIAGNOSIS — F411 Generalized anxiety disorder: Secondary | ICD-10-CM | POA: Diagnosis not present

## 2020-11-08 DIAGNOSIS — I10 Essential (primary) hypertension: Secondary | ICD-10-CM

## 2020-11-08 DIAGNOSIS — F5101 Primary insomnia: Secondary | ICD-10-CM

## 2020-11-08 DIAGNOSIS — F329 Major depressive disorder, single episode, unspecified: Secondary | ICD-10-CM

## 2020-11-08 DIAGNOSIS — G894 Chronic pain syndrome: Secondary | ICD-10-CM

## 2020-11-08 DIAGNOSIS — G4719 Other hypersomnia: Secondary | ICD-10-CM

## 2020-11-08 DIAGNOSIS — L97519 Non-pressure chronic ulcer of other part of right foot with unspecified severity: Secondary | ICD-10-CM

## 2020-11-08 MED ORDER — BUSPIRONE HCL 30 MG PO TABS: 30.0000 mg | ORAL_TABLET | Freq: Three times a day (TID) | ORAL | 2 refills | Status: DC | PRN

## 2020-11-08 MED ORDER — TETANUS-DIPHTH-ACELL PERTUSSIS 5-2.5-18.5 LF-MCG/0.5 IM SUSY: 0.5000 mL | PREFILLED_SYRINGE | Freq: Once | INTRAMUSCULAR | 0 refills | Status: AC

## 2020-11-08 MED ORDER — ZOLPIDEM TARTRATE 10 MG PO TABS: 10.0000 mg | ORAL_TABLET | Freq: Every day | ORAL | 0 refills | Status: DC

## 2020-11-08 MED ORDER — LISDEXAMFETAMINE DIMESYLATE 40 MG PO CAPS: 40.0000 mg | ORAL_CAPSULE | Freq: Every morning | ORAL | 0 refills | Status: DC
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
## 2020-11-11 ENCOUNTER — Ambulatory Visit: Payer: 59 | Admitting: Physician Assistant

## 2020-11-11 NOTE — Progress Notes (Signed)
Patient called IR Nurse phone today and left voice message with name and phone number, mobile [phone].  I returned the call and got his voice mail and left him a message with my name and that the IR Nurse, Jocelyn Lamer, will call him tomorrow 11/12/2020 to provide information for upcoming procedure schedule for 11/15/2020.  Also attempted home phone number with no answer.

## 2020-11-11 NOTE — Progress Notes (Signed)
Patient called back and left voice message and I returned call and spoke with him via telephone.  Patient states he is coming to [facility] for testing tomorrow 11/12/2020, and wants to "stop by" and talk with IR nurse regarding instructions for procedure on Friday.  I let him know she could call him tomorrow afternoon and he stated he wanted to come and talk with her in person.

## 2020-11-13 ENCOUNTER — Encounter: Payer: Self-pay | Admitting: Urgent Care

## 2020-11-13 ENCOUNTER — Other Ambulatory Visit: Payer: 59

## 2020-11-13 ENCOUNTER — Other Ambulatory Visit: Payer: Self-pay

## 2020-11-13 ENCOUNTER — Other Ambulatory Visit
Admission: RE | Admit: 2020-11-13 | Discharge: 2020-11-13 | Disposition: A | Discharge status: Home / Self Care | Payer: 59 | Source: Ambulatory Visit | Attending: Urology | Admitting: Urology

## 2020-11-13 DIAGNOSIS — Z20822 Contact with and (suspected) exposure to covid-19: Secondary | ICD-10-CM | POA: Insufficient documentation

## 2020-11-13 DIAGNOSIS — Z01812 Encounter for preprocedural laboratory examination: Secondary | ICD-10-CM | POA: Insufficient documentation

## 2020-11-13 LAB — CBC
HCT: 41.5 % (ref 39.0–52.0)
Hemoglobin: 13.5 g/dL (ref 13.0–17.0)
MCH: 27.1 pg (ref 26.0–34.0)
MCHC: 32.5 g/dL (ref 30.0–36.0)
MCV: 83.2 fL (ref 80.0–100.0)
Platelets: 251 10*3/uL (ref 150–400)
RBC: 4.99 MIL/uL (ref 4.22–5.81)
RDW: 18.4 % — ABNORMAL HIGH (ref 11.5–15.5)
WBC: 5.5 10*3/uL (ref 4.0–10.5)
nRBC: 0 % (ref 0.0–0.2)

## 2020-11-13 LAB — TYPE AND SCREEN
ABO/RH(D): O NEG
Antibody Screen: NEGATIVE

## 2020-11-13 LAB — BASIC METABOLIC PANEL
Anion gap: 9 (ref 5–15)
BUN: 16 mg/dL (ref 6–20)
CO2: 26 mmol/L (ref 22–32)
Calcium: 9.2 mg/dL (ref 8.9–10.3)
Chloride: 104 mmol/L (ref 98–111)
Creatinine, Ser: 0.88 mg/dL (ref 0.61–1.24)
GFR, Estimated: >60 mL/min (ref >60)
Glucose, Bld: 109 mg/dL — ABNORMAL HIGH (ref 70–99)
Potassium: 3.9 mmol/L (ref 3.5–5.1)
Sodium: 139 mmol/L (ref 135–145)

## 2020-11-13 LAB — SARS CORONAVIRUS 2 (TAT 6-24 HRS): SARS Coronavirus 2: NEGATIVE

## 2020-11-13 LAB — PROTIME-INR
INR: 1.1 (ref 0.8–1.2)
Prothrombin Time: 14.2 seconds (ref 11.4–15.2)

## 2020-11-14 ENCOUNTER — Other Ambulatory Visit: Payer: Self-pay | Admitting: Radiology

## 2020-11-15 ENCOUNTER — Observation Stay
Admission: RE | Admit: 2020-11-15 | Discharge: 2020-11-16 | Disposition: A | Discharge status: Home / Self Care | Payer: 59 | Source: Ambulatory Visit | Attending: Urology | Admitting: Urology

## 2020-11-15 ENCOUNTER — Ambulatory Visit: Payer: 59

## 2020-11-15 ENCOUNTER — Encounter: Payer: Self-pay | Admitting: Urology

## 2020-11-15 ENCOUNTER — Ambulatory Visit: Payer: 59 | Admitting: Urgent Care

## 2020-11-15 ENCOUNTER — Encounter
Admission: RE | Disposition: A | Discharge status: Home / Self Care | Payer: Self-pay | Source: Ambulatory Visit | Attending: Urology

## 2020-11-15 ENCOUNTER — Ambulatory Visit
Admission: RE | Admit: 2020-11-15 | Discharge: 2020-11-15 | Disposition: A | Discharge status: Home / Self Care | Payer: 59 | Source: Ambulatory Visit | Attending: Urology | Admitting: Urology

## 2020-11-15 ENCOUNTER — Other Ambulatory Visit: Payer: Self-pay

## 2020-11-15 DIAGNOSIS — I10 Essential (primary) hypertension: Secondary | ICD-10-CM | POA: Diagnosis not present

## 2020-11-15 DIAGNOSIS — N200 Calculus of kidney: Secondary | ICD-10-CM

## 2020-11-15 DIAGNOSIS — I2510 Atherosclerotic heart disease of native coronary artery without angina pectoris: Secondary | ICD-10-CM | POA: Diagnosis not present

## 2020-11-15 DIAGNOSIS — Z7982 Long term (current) use of aspirin: Secondary | ICD-10-CM | POA: Diagnosis not present

## 2020-11-15 DIAGNOSIS — E039 Hypothyroidism, unspecified: Secondary | ICD-10-CM | POA: Diagnosis not present

## 2020-11-15 DIAGNOSIS — I252 Old myocardial infarction: Secondary | ICD-10-CM | POA: Diagnosis not present

## 2020-11-15 DIAGNOSIS — R3129 Other microscopic hematuria: Secondary | ICD-10-CM | POA: Diagnosis present

## 2020-11-15 DIAGNOSIS — J45909 Unspecified asthma, uncomplicated: Secondary | ICD-10-CM | POA: Insufficient documentation

## 2020-11-15 DIAGNOSIS — Z79899 Other long term (current) drug therapy: Secondary | ICD-10-CM | POA: Diagnosis not present

## 2020-11-15 HISTORY — PX: NEPHROLITHOTOMY: SHX5134

## 2020-11-15 HISTORY — PX: IR NEPHROSTOMY PLACEMENT RIGHT: IMG6064

## 2020-11-15 LAB — ABO/RH: ABO/RH(D): O NEG

## 2020-11-15 SURGERY — NEPHROLITHOTOMY PERCUTANEOUS
Anesthesia: General | Laterality: Right

## 2020-11-15 MED ORDER — ACETAMINOPHEN 10 MG/ML IV SOLN
INTRAVENOUS | Status: AC
  Filled 2020-11-15: qty 100

## 2020-11-15 MED ORDER — MIDAZOLAM HCL 2 MG/2ML IJ SOLN
INTRAMUSCULAR | Status: AC
  Filled 2020-11-15: qty 2

## 2020-11-15 MED ORDER — DEXTROSE 5 % IV SOLN
1000.0000 mg | Freq: Once | INTRAVENOUS | Status: AC
  Administered 2020-11-15: 1000 mg via INTRAVENOUS
  Filled 2020-11-15: qty 10

## 2020-11-15 MED ORDER — LISDEXAMFETAMINE DIMESYLATE 20 MG PO CAPS
40.0000 mg | ORAL_CAPSULE | Freq: Every morning | ORAL | Status: DC
  Administered 2020-11-16: 40 mg via ORAL
  Filled 2020-11-15: qty 2

## 2020-11-15 MED ORDER — FENTANYL CITRATE (PF) 100 MCG/2ML IJ SOLN
25.0000 ug | INTRAMUSCULAR | Status: AC | PRN
  Administered 2020-11-15 (×2): 25 ug via INTRAVENOUS

## 2020-11-15 MED ORDER — ACETAMINOPHEN 10 MG/ML IV SOLN
INTRAVENOUS | Status: DC | PRN
  Administered 2020-11-15: 1000 mg via INTRAVENOUS

## 2020-11-15 MED ORDER — MIDAZOLAM HCL 5 MG/5ML IJ SOLN
INTRAMUSCULAR | Status: AC
  Filled 2020-11-15: qty 5

## 2020-11-15 MED ORDER — SODIUM CHLORIDE 0.45 % IV SOLN: INTRAVENOUS | Status: DC

## 2020-11-15 MED ORDER — BUSPIRONE HCL 15 MG PO TABS
30.0000 mg | ORAL_TABLET | Freq: Three times a day (TID) | ORAL | Status: DC | PRN
  Filled 2020-11-15: qty 2

## 2020-11-15 MED ORDER — TAMSULOSIN HCL 0.4 MG PO CAPS
0.4000 mg | ORAL_CAPSULE | Freq: Every day | ORAL | Status: DC
  Administered 2020-11-15: 0.4 mg via ORAL
  Filled 2020-11-15: qty 1

## 2020-11-15 MED ORDER — SUGAMMADEX SODIUM 500 MG/5ML IV SOLN
INTRAVENOUS | Status: DC | PRN
  Administered 2020-11-15: 250 mg via INTRAVENOUS

## 2020-11-15 MED ORDER — OXYCODONE HCL 5 MG PO TABS: 5.0000 mg | ORAL_TABLET | Freq: Four times a day (QID) | ORAL | 0 refills | Status: AC | PRN

## 2020-11-15 MED ORDER — MIDAZOLAM HCL 2 MG/2ML IJ SOLN
INTRAMUSCULAR | Status: DC | PRN
  Administered 2020-11-15: 2 mg via INTRAVENOUS

## 2020-11-15 MED ORDER — KETAMINE HCL 50 MG/ML IJ SOLN
INTRAMUSCULAR | Status: AC
  Filled 2020-11-15: qty 1

## 2020-11-15 MED ORDER — KETAMINE HCL 50 MG/ML IJ SOLN
INTRAMUSCULAR | Status: DC | PRN
  Administered 2020-11-15: 50 mg via INTRAMUSCULAR

## 2020-11-15 MED ORDER — FENTANYL CITRATE (PF) 100 MCG/2ML IJ SOLN
25.0000 ug | INTRAMUSCULAR | Status: DC | PRN
  Administered 2020-11-15: 50 ug via INTRAVENOUS
  Administered 2020-11-15: 25 ug via INTRAVENOUS

## 2020-11-15 MED ORDER — FENTANYL CITRATE (PF) 100 MCG/2ML IJ SOLN
INTRAMUSCULAR | Status: AC
  Administered 2020-11-15: 50 ug via INTRAVENOUS
  Filled 2020-11-15: qty 2

## 2020-11-15 MED ORDER — ALBUTEROL SULFATE (2.5 MG/3ML) 0.083% IN NEBU
2.5000 mg | INHALATION_SOLUTION | Freq: Four times a day (QID) | RESPIRATORY_TRACT | Status: DC | PRN
Start: 2020-11-15 — End: 2020-11-16

## 2020-11-15 MED ORDER — PROPOFOL 10 MG/ML IV BOLUS
INTRAVENOUS | Status: AC
  Filled 2020-11-15: qty 20

## 2020-11-15 MED ORDER — LIDOCAINE HCL (PF) 2 % IJ SOLN
INTRAMUSCULAR | Status: AC
  Filled 2020-11-15: qty 5

## 2020-11-15 MED ORDER — SUCCINYLCHOLINE CHLORIDE 200 MG/10ML IV SOSY
PREFILLED_SYRINGE | INTRAVENOUS | Status: AC
  Filled 2020-11-15: qty 10

## 2020-11-15 MED ORDER — AMLODIPINE BESYLATE 5 MG PO TABS
5.0000 mg | ORAL_TABLET | Freq: Every day | ORAL | Status: DC
  Administered 2020-11-15: 5 mg via ORAL
  Filled 2020-11-15: qty 1

## 2020-11-15 MED ORDER — FAMOTIDINE 20 MG PO TABS: 20.0000 mg | ORAL_TABLET | Freq: Once | ORAL | Status: AC

## 2020-11-15 MED ORDER — FAMOTIDINE 20 MG PO TABS
ORAL_TABLET | ORAL | Status: AC
  Administered 2020-11-15: 20 mg via ORAL
  Filled 2020-11-15: qty 1

## 2020-11-15 MED ORDER — ONDANSETRON HCL 4 MG/2ML IJ SOLN
INTRAMUSCULAR | Status: AC
  Filled 2020-11-15: qty 2

## 2020-11-15 MED ORDER — CARVEDILOL 12.5 MG PO TABS
ORAL_TABLET | ORAL | Status: AC
  Filled 2020-11-15: qty 1

## 2020-11-15 MED ORDER — OXYCODONE HCL 5 MG/5ML PO SOLN: 5.0000 mg | Freq: Once | ORAL | Status: AC | PRN

## 2020-11-15 MED ORDER — ONDANSETRON HCL 4 MG PO TABS: 8.0000 mg | ORAL_TABLET | Freq: Three times a day (TID) | ORAL | Status: DC | PRN

## 2020-11-15 MED ORDER — OXYCODONE HCL 5 MG PO TABS
ORAL_TABLET | ORAL | Status: AC
  Administered 2020-11-15: 10 mg via ORAL
  Filled 2020-11-15: qty 2

## 2020-11-15 MED ORDER — DEXAMETHASONE SODIUM PHOSPHATE 10 MG/ML IJ SOLN
INTRAMUSCULAR | Status: AC
  Filled 2020-11-15: qty 1

## 2020-11-15 MED ORDER — SODIUM CHLORIDE 0.9 % IV SOLN
2.0000 g | INTRAVENOUS | Status: DC
  Administered 2020-11-15: 2 g via INTRAVENOUS
  Filled 2020-11-15: qty 2
  Filled 2020-11-15: qty 20

## 2020-11-15 MED ORDER — LEVOTHYROXINE SODIUM 75 MCG PO TABS
75.0000 ug | ORAL_TABLET | Freq: Every day | ORAL | Status: DC
Start: 2020-11-16 — End: 2020-11-16
  Administered 2020-11-16: 75 ug via ORAL
  Filled 2020-11-15 (×2): qty 1

## 2020-11-15 MED ORDER — PROPOFOL 10 MG/ML IV BOLUS
INTRAVENOUS | Status: DC | PRN
  Administered 2020-11-15: 200 mg via INTRAVENOUS

## 2020-11-15 MED ORDER — IOHEXOL 180 MG/ML  SOLN
INTRAMUSCULAR | Status: DC | PRN
  Administered 2020-11-15: 60 mL

## 2020-11-15 MED ORDER — ATORVASTATIN CALCIUM 20 MG PO TABS
40.0000 mg | ORAL_TABLET | Freq: Every day | ORAL | Status: DC
  Administered 2020-11-15: 40 mg via ORAL
  Filled 2020-11-15: qty 2

## 2020-11-15 MED ORDER — SUGAMMADEX SODIUM 500 MG/5ML IV SOLN
INTRAVENOUS | Status: AC
  Filled 2020-11-15: qty 5

## 2020-11-15 MED ORDER — DEXMEDETOMIDINE (PRECEDEX) IN NS 20 MCG/5ML (4 MCG/ML) IV SYRINGE
PREFILLED_SYRINGE | INTRAVENOUS | Status: AC
  Filled 2020-11-15: qty 5

## 2020-11-15 MED ORDER — ONDANSETRON HCL 4 MG/2ML IJ SOLN: 4.0000 mg | INTRAMUSCULAR | Status: DC | PRN

## 2020-11-15 MED ORDER — ZOLPIDEM TARTRATE 5 MG PO TABS
10.0000 mg | ORAL_TABLET | Freq: Every day | ORAL | Status: DC
  Filled 2020-11-15: qty 2

## 2020-11-15 MED ORDER — FENTANYL CITRATE (PF) 100 MCG/2ML IJ SOLN
INTRAMUSCULAR | Status: DC | PRN
  Administered 2020-11-15 (×2): 50 ug via INTRAVENOUS

## 2020-11-15 MED ORDER — ACETAMINOPHEN 500 MG PO TABS
1000.0000 mg | ORAL_TABLET | Freq: Four times a day (QID) | ORAL | Status: DC
  Administered 2020-11-16 (×2): 1000 mg via ORAL
  Filled 2020-11-15 (×2): qty 2

## 2020-11-15 MED ORDER — FENTANYL CITRATE (PF) 100 MCG/2ML IJ SOLN
INTRAMUSCULAR | Status: AC
  Administered 2020-11-15: 25 ug via INTRAVENOUS
  Filled 2020-11-15: qty 2

## 2020-11-15 MED ORDER — SODIUM CHLORIDE 0.9 % IV SOLN: INTRAVENOUS | Status: DC

## 2020-11-15 MED ORDER — DEXMEDETOMIDINE (PRECEDEX) IN NS 20 MCG/5ML (4 MCG/ML) IV SYRINGE
PREFILLED_SYRINGE | INTRAVENOUS | Status: DC | PRN
  Administered 2020-11-15: 10 ug via INTRAVENOUS

## 2020-11-15 MED ORDER — CEFAZOLIN SODIUM-DEXTROSE 2-4 GM/100ML-% IV SOLN
2.0000 g | INTRAVENOUS | Status: AC
  Administered 2020-11-15: 2 g via INTRAVENOUS

## 2020-11-15 MED ORDER — CEFAZOLIN SODIUM-DEXTROSE 2-4 GM/100ML-% IV SOLN
INTRAVENOUS | Status: AC
  Filled 2020-11-15: qty 100

## 2020-11-15 MED ORDER — LACTATED RINGERS IV SOLN: INTRAVENOUS | Status: DC

## 2020-11-15 MED ORDER — BUPROPION HCL ER (XL) 150 MG PO TB24
150.0000 mg | ORAL_TABLET | Freq: Every day | ORAL | Status: DC
  Administered 2020-11-16: 150 mg via ORAL
  Filled 2020-11-15: qty 1

## 2020-11-15 MED ORDER — FENTANYL CITRATE (PF) 100 MCG/2ML IJ SOLN
INTRAMUSCULAR | Status: AC | PRN
  Administered 2020-11-15 (×4): 25 ug via INTRAVENOUS

## 2020-11-15 MED ORDER — DULOXETINE HCL 60 MG PO CPEP
60.0000 mg | ORAL_CAPSULE | Freq: Every day | ORAL | Status: DC
  Administered 2020-11-16: 60 mg via ORAL
  Filled 2020-11-15 (×2): qty 1
  Filled 2020-11-15: qty 2

## 2020-11-15 MED ORDER — OXYCODONE HCL 5 MG PO TABS
5.0000 mg | ORAL_TABLET | Freq: Once | ORAL | Status: AC | PRN
  Administered 2020-11-15: 5 mg via ORAL

## 2020-11-15 MED ORDER — ONDANSETRON HCL 4 MG/2ML IJ SOLN
INTRAMUSCULAR | Status: DC | PRN
  Administered 2020-11-15: 4 mg via INTRAVENOUS

## 2020-11-15 MED ORDER — CHLORHEXIDINE GLUCONATE 0.12 % MT SOLN
OROMUCOSAL | Status: AC
  Administered 2020-11-15: 15 mL via OROMUCOSAL
  Filled 2020-11-15: qty 15

## 2020-11-15 MED ORDER — IODIXANOL 320 MG/ML IV SOLN: 100.0000 mL | Freq: Once | INTRAVENOUS | Status: DC | PRN

## 2020-11-15 MED ORDER — ROCURONIUM BROMIDE 100 MG/10ML IV SOLN
INTRAVENOUS | Status: DC | PRN
  Administered 2020-11-15: 10 mg via INTRAVENOUS
  Administered 2020-11-15: 40 mg via INTRAVENOUS

## 2020-11-15 MED ORDER — LIDOCAINE HCL (CARDIAC) PF 100 MG/5ML IV SOSY
PREFILLED_SYRINGE | INTRAVENOUS | Status: DC | PRN
  Administered 2020-11-15: 100 mg via INTRAVENOUS

## 2020-11-15 MED ORDER — CARVEDILOL 12.5 MG PO TABS
12.5000 mg | ORAL_TABLET | Freq: Two times a day (BID) | ORAL | Status: DC
  Administered 2020-11-15 – 2020-11-16 (×2): 12.5 mg via ORAL
  Filled 2020-11-15: qty 1

## 2020-11-15 MED ORDER — ACETAMINOPHEN 10 MG/ML IV SOLN: 1000.0000 mg | Freq: Once | INTRAVENOUS | Status: DC | PRN

## 2020-11-15 MED ORDER — 0.9 % SODIUM CHLORIDE (POUR BTL) OPTIME
TOPICAL | Status: DC | PRN
  Administered 2020-11-15: 400 mL

## 2020-11-15 MED ORDER — CHLORHEXIDINE GLUCONATE 0.12 % MT SOLN: 15.0000 mL | Freq: Once | OROMUCOSAL | Status: AC

## 2020-11-15 MED ORDER — ROCURONIUM BROMIDE 10 MG/ML (PF) SYRINGE
PREFILLED_SYRINGE | INTRAVENOUS | Status: AC
  Filled 2020-11-15: qty 10

## 2020-11-15 MED ORDER — DEXAMETHASONE SODIUM PHOSPHATE 10 MG/ML IJ SOLN
INTRAMUSCULAR | Status: DC | PRN
  Administered 2020-11-15: 10 mg via INTRAVENOUS

## 2020-11-15 MED ORDER — SUCCINYLCHOLINE CHLORIDE 200 MG/10ML IV SOSY
PREFILLED_SYRINGE | INTRAVENOUS | Status: DC | PRN
  Administered 2020-11-15: 120 mg via INTRAVENOUS

## 2020-11-15 MED ORDER — CHLORHEXIDINE GLUCONATE CLOTH 2 % EX PADS
6.0000 | MEDICATED_PAD | Freq: Every day | CUTANEOUS | Status: DC
  Administered 2020-11-16 (×2): 6 via TOPICAL

## 2020-11-15 MED ORDER — HYDROMORPHONE HCL 1 MG/ML IJ SOLN
0.5000 mg | INTRAMUSCULAR | Status: DC | PRN
  Administered 2020-11-16: 1 mg via INTRAVENOUS
  Filled 2020-11-15: qty 1

## 2020-11-15 MED ORDER — ORAL CARE MOUTH RINSE: 15.0000 mL | Freq: Once | OROMUCOSAL | Status: AC

## 2020-11-15 MED ORDER — OXYCODONE HCL 5 MG PO TABS
ORAL_TABLET | ORAL | Status: AC
  Filled 2020-11-15: qty 1

## 2020-11-15 MED ORDER — MIDAZOLAM HCL 5 MG/5ML IJ SOLN
INTRAMUSCULAR | Status: AC | PRN
  Administered 2020-11-15 (×3): 1 mg via INTRAVENOUS

## 2020-11-15 MED ORDER — OXYCODONE HCL 5 MG PO TABS
5.0000 mg | ORAL_TABLET | ORAL | Status: DC | PRN
  Administered 2020-11-15: 10 mg via ORAL
  Administered 2020-11-16: 5 mg via ORAL
  Administered 2020-11-16: 10 mg via ORAL
  Filled 2020-11-15 (×3): qty 2

## 2020-11-15 MED ORDER — FENTANYL CITRATE (PF) 100 MCG/2ML IJ SOLN
INTRAMUSCULAR | Status: AC
  Filled 2020-11-15: qty 2

## 2020-11-15 MED ORDER — ONDANSETRON HCL 4 MG/2ML IJ SOLN
4.0000 mg | Freq: Once | INTRAMUSCULAR | Status: DC | PRN
Start: 2020-11-15 — End: 2020-11-15

## 2020-11-15 SURGICAL SUPPLY — 69 items
ADAPTER IRRIG TUBE 2 SPIKE SOL (ADAPTER) ×4 IMPLANT
ADAPTER SCOPE UROLOK II (MISCELLANEOUS) IMPLANT
BAG URINE DRAIN 2000ML AR STRL (UROLOGICAL SUPPLIES) ×2 IMPLANT
BALLN NEPHROSTOMY 10X15 (UROLOGICAL SUPPLIES) ×2 IMPLANT
BASKET ZERO TIP 1.9FR (BASKET) ×1 IMPLANT
BLADE SURG 15 STRL LF DISP TIS (BLADE) ×1 IMPLANT
BLADE SURG 15 STRL SS (BLADE) ×2
CATH COUNCIL 22FR (CATHETERS) IMPLANT
CATH FOLEY 2W COUNCIL 20FR 5CC (CATHETERS) ×1 IMPLANT
CATH FOLEY 2W COUNCIL 5CC 18FR (CATHETERS) IMPLANT
CATH STENT KAYE NEPHR TAMP (CATHETERS) IMPLANT
CATH TORCON 5FR 0.38 (CATHETERS) IMPLANT
CATH URET FLEX-TIP 2 LUMEN 10F (CATHETERS) IMPLANT
CATH URETL OPEN 5X70 (CATHETERS) ×2 IMPLANT
CATH/STENT KAYE NEPHR TAMP (CATHETERS)
CHLORAPREP W/TINT 26 (MISCELLANEOUS) ×2 IMPLANT
CNTNR SPEC 2.5X3XGRAD LEK (MISCELLANEOUS) ×1
CONT SPEC 4OZ STER OR WHT (MISCELLANEOUS) ×1
CONT SPEC 4OZ STRL OR WHT (MISCELLANEOUS) ×1
CONTAINER SPEC 2.5X3XGRAD LEK (MISCELLANEOUS) ×1 IMPLANT
DRAPE 3/4 80X56 (DRAPES) ×2 IMPLANT
DRAPE C ARM PK CFD 31 SPINE (DRAPES) ×1 IMPLANT
GAUZE 4X4 16PLY ~~LOC~~+RFID DBL (SPONGE) ×2 IMPLANT
GAUZE SPONGE 4X4 12PLY STRL (GAUZE/BANDAGES/DRESSINGS) ×2 IMPLANT
GLIDEWIRE STIFF .35X180X3 HYDR (WIRE) ×2 IMPLANT
GLOVE SURG UNDER POLY LF SZ7.5 (GLOVE) ×2 IMPLANT
GOWN STRL REUS W/ TWL LRG LVL3 (GOWN DISPOSABLE) ×1 IMPLANT
GOWN STRL REUS W/ TWL XL LVL3 (GOWN DISPOSABLE) ×1 IMPLANT
GOWN STRL REUS W/TWL LRG LVL3 (GOWN DISPOSABLE) ×2
GOWN STRL REUS W/TWL XL LVL3 (GOWN DISPOSABLE) ×2
GUIDEWIRE GREEN .038 145CM (MISCELLANEOUS) IMPLANT
GUIDEWIRE INTRO SET STRAIGHT (WIRE) ×2 IMPLANT
GUIDEWIRE STR DUAL SENSOR (WIRE) ×2 IMPLANT
GUIDEWIRE STR ZIPWIRE 035X150 (MISCELLANEOUS) IMPLANT
GUIDEWIRE SUPER STIFF (WIRE) ×4 IMPLANT
HOLDER FOLEY CATH W/STRAP (MISCELLANEOUS) ×2 IMPLANT
IV NS IRRIG 3000ML ARTHROMATIC (IV SOLUTION) ×12 IMPLANT
KIT PROBE TRILOGY 3.9X350 (MISCELLANEOUS) ×2 IMPLANT
MANIFOLD NEPTUNE II (INSTRUMENTS) ×4 IMPLANT
MAT ABSORB  FLUID 56X50 GRAY (MISCELLANEOUS) ×4
MAT ABSORB FLUID 56X50 GRAY (MISCELLANEOUS) ×2 IMPLANT
NDL FASCIA INCISION 18GA (NEEDLE) ×2 IMPLANT
PACK BASIN MINOR ARMC (MISCELLANEOUS) ×2 IMPLANT
PAD ABD DERMACEA PRESS 5X9 (GAUZE/BANDAGES/DRESSINGS) ×4 IMPLANT
PAD PREP 24X41 OB/GYN DISP (PERSONAL CARE ITEMS) ×2 IMPLANT
SET IRRIGATING DISP (SET/KITS/TRAYS/PACK) ×2 IMPLANT
SHEET NEURO XL SOL CTL (MISCELLANEOUS) ×2 IMPLANT
SPONGE DRAIN TRACH 4X4 STRL 2S (GAUZE/BANDAGES/DRESSINGS) ×2 IMPLANT
STENT URET 6FRX24 CONTOUR (STENTS) IMPLANT
STENT URET 6FRX26 CONTOUR (STENTS) IMPLANT
STENT URET 6FRX30 CONTOUR (STENTS) ×1 IMPLANT
STRAP SAFETY 5IN WIDE (MISCELLANEOUS) ×5 IMPLANT
SURGILUBE 2OZ TUBE FLIPTOP (MISCELLANEOUS) ×2 IMPLANT
SUT ETHILON 2 0 FS 18 (SUTURE) ×1 IMPLANT
SUT MNCRL 4-0 (SUTURE) ×2
SUT MNCRL 4-0 27XMFL (SUTURE) ×1
SUTURE MNCRL 4-0 27XMF (SUTURE) IMPLANT
SYR 10ML LL (SYRINGE) ×2 IMPLANT
SYR 20ML LL LF (SYRINGE) ×2 IMPLANT
SYR 30ML LL (SYRINGE) ×3 IMPLANT
SYR TOOMEY IRRIG 70ML (MISCELLANEOUS) ×2
SYRINGE TOOMEY IRRIG 70ML (MISCELLANEOUS) ×1 IMPLANT
TAPE CLOTH 3X10 WHT NS LF (GAUZE/BANDAGES/DRESSINGS) ×2 IMPLANT
TAPE MICROFOAM 4IN (TAPE) ×2 IMPLANT
TRAP SPECIMEN MUCUS 40CC (MISCELLANEOUS) ×2 IMPLANT
TRAY FOLEY MTR SLVR 16FR STAT (SET/KITS/TRAYS/PACK) ×2 IMPLANT
TUBING CONNECTING 10 (TUBING) ×2 IMPLANT
VALVE UROSEAL ADJ ENDO (VALVE) ×2 IMPLANT
WATER STERILE IRR 1000ML POUR (IV SOLUTION) ×2 IMPLANT

## 2020-11-15 NOTE — Anesthesia Procedure Notes (Signed)
Procedure Name: Intubation Date/Time: 11/15/2020 11:54 AM Performed by: Debe Coder, CRNA Pre-anesthesia Checklist: Patient identified, Emergency Drugs available, Suction available and Patient being monitored Patient Re-evaluated:Patient Re-evaluated prior to induction Oxygen Delivery Method: Circle system utilized Preoxygenation: Pre-oxygenation with 100% oxygen Induction Type: IV induction Ventilation: Mask ventilation without difficulty Laryngoscope Size: McGraph and 4 Grade View: Grade I Tube type: Oral Tube size: 7.5 mm Number of attempts: 1 Airway Equipment and Method: Stylet and Oral airway Placement Confirmation: ETT inserted through vocal cords under direct vision, positive ETCO2 and breath sounds checked- equal and bilateral Secured at: 22 cm Tube secured with: Tape Dental Injury: Teeth and Oropharynx as per pre-operative assessment
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
## 2020-11-15 NOTE — Procedures (Signed)
Interventional Radiology Procedure Note  Procedure: Right perc ureteral catheter placement  Complications: None  Estimated Blood Loss: < 10 mL  Findings: Upper pole access under US guidance with 21 G needle for contrast injection. Fluoro guided access of interpolar calyx with 21 G needle followed by transitional dilator placement over guidewire. 5 Fr Kumpe catheter advanced over wire into renal pelvis and down right ureter to level of bladder. Catheter capped and secured at skin.  Venetia Night. Kathlene Cote, M.D Pager:  [phone]
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
## 2020-11-15 NOTE — Transfer of Care (Signed)
Immediate Anesthesia Transfer of Care Note  Patient: Charles [name]  Procedure(s) Performed: NEPHROLITHOTOMY PERCUTANEOUS (Right)  Patient Location: PACU  Anesthesia Type:General  Level of Consciousness: drowsy and patient cooperative  Airway & Oxygen Therapy: Patient Spontanous Breathing and Patient connected to face mask oxygen  Post-op Assessment: Report given to RN and Post -op Vital signs reviewed and stable  Post vital signs: Reviewed and stable  Last Vitals:  Vitals Value Taken Time  BP 133/78 11/15/20 1351  Temp    Pulse 65 11/15/20 1358  Resp 15 11/15/20 1358  SpO2 99 % 11/15/20 1358  Vitals shown include unvalidated device data.  Last Pain:  Vitals:   11/15/20 1100  TempSrc:   PainSc: 8          Complications: No notable events documented.

## 2020-11-15 NOTE — Op Note (Addendum)
Date of procedure: 11/15/20   Preoperative diagnosis:  Right renal stone, 2.5 cm   Postoperative diagnosis:  Same   Procedure: Right percutaneous nephrolithotomy, >2cm   Surgeon: Nickolas Madrid, MD   Anesthesia: General   Complications: None   Intraoperative findings:  1.  Uncomplicated fragmentation and extraction of 2.5 cm renal stone, no residual stones on flexible nephroscopy at conclusion of case 2.  Uncomplicated right ureteral stent placement and nephrostomy tube   EBL: 50 mm   Specimens: Stone for analysis   Drains: 33 [place] [employer] scientist (life sciences) as nephrostomy tube, 6 [place] by 30 cm right ureteral stent   Indication: Charles [name] is a 60 year old patient with 2.5 cm right renal stone who opted for PCNL for treatment.  After reviewing the management options for treatment, they elected to proceed with the above surgical procedure(s). We have discussed the potential benefits and risks of the procedure, side effects of the proposed treatment, the likelihood of the patient achieving the goals of the procedure, and any potential problems that might occur during the procedure or recuperation. Informed consent has been obtained.   Description of procedure: A right mid pole nephroureteral access has been placed by interventional radiology earlier in the day.  Please see their note for details.   The patient was taken to the operating room and general anesthesia was induced. SCDs were placed for DVT prophylaxis.  Ceftriaxone was given for antibiotics.  A 16 French two-way Foley was then placed in the bladder and 10 cc placed in the balloon.   The patient was then flipped into the prone position and meticulously padded.  He was prepped and draped in standard sterile fashion, and a drape was applied over the nephrostomy tube access.  I started by advancing a sensor wire through the nephrostomy tube and this was passed down to the bladder under fluoroscopic vision, and the nephrostomy  catheter was removed.  A dual-lumen access catheter was advanced over the wire into the proximal ureter, and a second Super Stiff wire was added.  A 1 cm skin incision was made, and the fascial incision device was advanced over the wire in a cruciate fashion.  The 64 French PCNL balloon was advanced over the Super Stiff wire under fluoroscopic vision and inflated to a total of 20 ATM when positioned across the kidney.  Under fluoroscopic vision, the sheath was then carefully advanced over the balloon and into the kidney onto the stone, which could clearly be seen on fluoroscopy.  The nephroscope with Trilogy device was inserted into the kidney and vision was good.  There was a large round yellow stone that encompassed the entire renal pelvis.  The stone was fragmented using pneumatic and ultrasonic energy, and all pieces suctioned free.  The flexible nephroscope was then used to navigate the renal pelvis and calyces and a few small additional fragments were identified and basket extracted.  I again inserted the rigid nephroscope with the trilogy device and suctioned free a few small fragments, but no other large stone fragments remained.     Contrast was injected through the scope and showed no extravasation, and opacified the ureter down to the bladder  A wire was placed through the nephroscope down the ureter and under direct vision and with the aid of fluoroscopy a 6 French by 30 cm ureteral stent was uneventfully placed with an excellent curl in the bladder, as well as under direct vision in the renal pelvis.     The wire was  pulled back into the kidney, and the 33 [place] council Foley was placed through the sheath over the wire into the kidney under fluoroscopic vision.  Only 4 cc were placed in the balloon, and this was located in the renal pelvis.  Contrast was injected through the catheter and opacified the collecting system with the balloon in excellent position, and again no extravasation  noted.  The sheath was removed and pressure was applied.  The Foley was sutured into place and secured.  Urine was light pink.   A dressing with gauze, ABD pads, and foam tape was applied, and both the Foley nephrostomy tube and the urethral Foley were connected to drainage.   Disposition: Stable to PACU   Plan: Anticipate nephrostomy tube removal tomorrow morning, Foley catheter removal in the late afternoon and discharge home Follow-up for stent removal in 1 week  Nickolas Madrid, MD 11/15/2020

## 2020-11-16 DIAGNOSIS — N200 Calculus of kidney: Secondary | ICD-10-CM | POA: Diagnosis not present

## 2020-11-16 LAB — CBC
HCT: 40.4 % (ref 39.0–52.0)
Hemoglobin: 13.4 g/dL (ref 13.0–17.0)
MCH: 27.2 pg (ref 26.0–34.0)
MCHC: 33.2 g/dL (ref 30.0–36.0)
MCV: 82.1 fL (ref 80.0–100.0)
Platelets: 275 10*3/uL (ref 150–400)
RBC: 4.92 MIL/uL (ref 4.22–5.81)
RDW: 17.8 % — ABNORMAL HIGH (ref 11.5–15.5)
WBC: 8.1 10*3/uL (ref 4.0–10.5)
nRBC: 0 % (ref 0.0–0.2)

## 2020-11-16 LAB — BASIC METABOLIC PANEL
Anion gap: 6 (ref 5–15)
BUN: 11 mg/dL (ref 6–20)
CO2: 24 mmol/L (ref 22–32)
Calcium: 8.9 mg/dL (ref 8.9–10.3)
Chloride: 106 mmol/L (ref 98–111)
Creatinine, Ser: 0.91 mg/dL (ref 0.61–1.24)
GFR, Estimated: >60 mL/min (ref >60)
Glucose, Bld: 197 mg/dL — ABNORMAL HIGH (ref 70–99)
Potassium: 4 mmol/L (ref 3.5–5.1)
Sodium: 136 mmol/L (ref 135–145)

## 2020-11-16 NOTE — Discharge Summary (Signed)
Physician Discharge Summary  Patient ID: Charles [name] MRN: [id number] DOB/AGE: 1961-01-30 60 y.o.  Admit date: 11/15/2020 Discharge date: 11/16/2020  Admission Diagnoses:  Discharge Diagnoses:  Active Problems:   Right renal stone   Discharged Condition: good  Hospital Course: 59 year old male status post right PCNL on 11/15/2020.  He remained overnight on observation.  He was stable and doing well the following day.  His nephrostomy tube and Foley catheter were removed.  He was discharged in stable condition.  Consults: None  Significant Diagnostic Studies: labs: None  Treatments: surgery: As above  Discharge Exam: Blood pressure 138/72, pulse 69, temperature 98.2 F (36.8 C), temperature source Oral, resp. rate 18, height '6\' 1"'$  (1.854 m), weight 104.3 kg, SpO2 97 %. General appearance: alert no acute distress Adequate perfusion of extremities Nonlabored respiration, good oxygen saturations on room air Foley catheter in place draining light pink urine Right-sided nephrostomy tube draining light red urine.  Surrounding area without any cellulitis or areas of obvious erythema.  Nephrostomy tube removed.  Disposition: Discharge disposition: 01-Home or Self Care       Discharge Instructions     Discharge instructions   Complete by: As directed    You underwent a percutaneous nephrolithotomy. It is normal to have some flank pain and bloody urine with the stent in place. Call the clinic or present to the ED if you have fever over 101.3, large blood clots in the urine bigger than a quarter, or uncontrolled pain. Do not resume aspirin for 5 days. No heavy lifting for at least 1 week after surgery, ok to shower starting Sunday. No tub baths or swimming for 2 weeks until flank wound healed.  Follow up in clinic in one week for stent removal, the clinic will contact you to schedule this appointment.   No wound care   Complete by: As directed       Allergies as of 11/16/2020        Reactions   Lisinopril Cough        Medication List     STOP taking these medications    Fetzima 40 MG Cp24 Generic drug: Levomilnacipran HCl ER   magnesium oxide 400 MG tablet Commonly known as: MAG-OX   Morphine Sulfate ER 15 MG T12a       TAKE these medications    albuterol 108 (90 Base) MCG/ACT inhaler Commonly known as: VENTOLIN HFA Inhale 2 puffs into the lungs every 6 (six) hours as needed for wheezing or shortness of breath.   aspirin EC 81 MG tablet Take 81 mg by mouth daily. Swallow whole.   B-12 PO Take 1,000 mcg by mouth daily.   Biotin 5 MG Caps Take 5 mg by mouth daily.   Buprenorphine HCl-Naloxone HCl 8-2 MG Film Place 1 Piece under the tongue every 12 (twelve) hours.   buPROPion 150 MG 24 hr tablet Commonly known as: Wellbutrin XL Take 1 tablet (150 mg total) by mouth daily.   busPIRone 30 MG tablet Commonly known as: BUSPAR Take 1 tablet (30 mg total) by mouth 3 (three) times daily as needed.   carvedilol 12.5 MG tablet Commonly known as: COREG Take 1 tablet (12.5 mg total) by mouth 2 (two) times daily with a meal.   Co Q-10 200 MG Caps Take 200 mg by mouth daily.   DULoxetine 60 MG capsule Commonly known as: CYMBALTA Take 1 capsule (60 mg total) by mouth daily.   ferrous sulfate 325 (65 FE) MG tablet Take 1  tablet (325 mg total) by mouth daily with breakfast.   levothyroxine 75 MCG tablet Commonly known as: SYNTHROID Take 1 tablet (75 mcg total) by mouth daily before breakfast.   lisdexamfetamine 40 MG capsule Commonly known as: Vyvanse Take 1 capsule (40 mg total) by mouth every morning.   multivitamin with minerals Tabs tablet Take 1 tablet by mouth daily.   naloxegol oxalate 25 MG Tabs tablet Commonly known as: Movantik Take 1 tablet (25 mg total) by mouth daily.   oxyCODONE 5 MG immediate release tablet Commonly known as: Roxicodone Take 1 tablet (5 mg total) by mouth every 6 (six) hours as needed for severe  pain. What changed: Another medication with the same name was added. Make sure you understand how and when to take each.   oxyCODONE 5 MG immediate release tablet Commonly known as: Oxy IR/ROXICODONE Take 1-2 tablets (5-10 mg total) by mouth every 6 (six) hours as needed for up to 3 days for breakthrough pain (for severe pain). What changed: You were already taking a medication with the same name, and this prescription was added. Make sure you understand how and when to take each.   polycarbophil 625 MG tablet Commonly known as: FIBERCON Take 625 mg by mouth daily.   polyethylene glycol 17 g packet Commonly known as: MIRALAX / GLYCOLAX Take 17 g by mouth daily as needed for mild constipation.   SAMBUCUS ELDERBERRY PO Take 2 tablets by mouth daily.   sildenafil 20 MG tablet Commonly known as: REVATIO Take 1 tablet (20 mg total) by mouth daily as needed (ED).   tamsulosin 0.4 MG Caps capsule Commonly known as: FLOMAX Take 1 capsule (0.4 mg total) by mouth daily. What changed: when to take this   TURMERIC PO Take 1 capsule by mouth daily.   vitamin C 500 MG tablet Commonly known as: ASCORBIC ACID Take 500 mg by mouth daily.   Vitamin D 125 MCG (5000 UT) Caps Take 5,000 Units by mouth daily.   zolpidem 10 MG tablet Commonly known as: AMBIEN Take 1 tablet (10 mg total) by mouth at bedtime.       ASK your doctor about these medications    amLODipine 5 MG tablet Commonly known as: NORVASC Take 1 tablet (5 mg total) by mouth daily.   atorvastatin 40 MG tablet Commonly known as: LIPITOR Take one tab at bed time for cholesterol   ondansetron 8 MG tablet Commonly known as: ZOFRAN TAKE 1 TABLET BY MOUTH 3 TIMES DAILY AS NEEDED FOR NAUSEA         Signed: Marton Redwood, III 11/16/2020, 11:33 AM

## 2020-11-16 NOTE — Discharge Instructions (Signed)
Discharge instructions following PCNL ° °Call your doctor for: °Fevers greater than 100.5 °Severe nausea or vomiting °Increasing pain not controlled by pain medication °Increasing redness or drainage from incisions °Decreased urine output or a catheter is no longer draining ° °The number for questions is [phone]. ° °Activity: °Gradually increase activity with short frequent walks, 3-4 times a day.  Avoid strenuous activities, like sports, lawn-mowing, or heavy lifting (more than 10-15 pounds).  Wear loose, comfortable clothing that pull or kink the tube or tubes.  Do not drive while taking pain medication, or until your doctor permitts it. ° °Bathing and dressing changes: °You should not shower for 48 hours after surgery.  Do not soak your back in a bathtub. ° °Drainage bag care: °You may be discharged with a drainage bag around the site of your surgery.  The drainage bag should be secured such that it never pulls or loosens to prevent it from leaking.  It is important to wash her hands before and after emptying the drainage bag to help prevent the spread of infection.  The drainage bag should be emptied as needed.  When the wound stops draining or it is manageable with a dry gauze dressing, you can remove the bag. ° °If your tube in the back was removed, you should expect to have some leakage of fluid from the back incision.  This should slowly decrease and stop over the next couple of days.  If you have severe pain or persistent leakage, please call the number above.  Otherwise, your dressing can be changed 1-2 times daily or more if needed. ° °Diet: °It is extremely important to drink plenty of fluids after surgery, especially water.  You may resume your regular diet, unless otherwise instructed. ° °Medications: °May take Tylenol (acetaminophen) or ibuprofen (Advil, Motrin) as directed over-the-counter. °Take any prescriptions as directed. ° °Follow-up appointments: °Follow-up appointment will be scheduled  with Dr. Jolane Bankhead °

## 2020-11-16 NOTE — Anesthesia Postprocedure Evaluation (Signed)
Anesthesia Post Note  Patient: Charles [name]  Procedure(s) Performed: NEPHROLITHOTOMY PERCUTANEOUS (Right)  Patient location during evaluation: PACU Anesthesia Type: General Level of consciousness: awake and alert Pain management: pain level controlled Vital Signs Assessment: post-procedure vital signs reviewed and stable Respiratory status: spontaneous breathing, nonlabored ventilation, respiratory function stable and patient connected to nasal cannula oxygen Cardiovascular status: blood pressure returned to baseline and stable Postop Assessment: no apparent nausea or vomiting Anesthetic complications: no   No notable events documented.   Last Vitals:  Vitals:   11/16/20 0438 11/16/20 0812  BP: (!) 141/73 138/72  Pulse: 70 69  Resp: 20 18  Temp: 37.1 C 36.8 C  SpO2: 95% 97%    Last Pain:  Vitals:   11/16/20 0812  TempSrc: Oral  PainSc:                  Martha Clan

## 2020-11-16 NOTE — Progress Notes (Signed)
Patient urinated before discharge, given instructions to keep follow up appointment, when to return for worsening symptoms, IV taken out, Oxycodone education given, & patient to be discharged via wheelchair.

## 2020-11-17 ENCOUNTER — Encounter: Payer: Self-pay | Admitting: Urology

## 2020-11-18 ENCOUNTER — Telehealth: Payer: Self-pay | Admitting: Urology

## 2020-11-18 ENCOUNTER — Other Ambulatory Visit: Payer: Self-pay | Admitting: Urology

## 2020-11-18 MED ORDER — OXYCODONE HCL 5 MG PO TABS: 5.0000 mg | ORAL_TABLET | Freq: Four times a day (QID) | ORAL | 0 refills | Status: DC | PRN

## 2020-11-18 NOTE — Progress Notes (Signed)
Prescription send for pain medications.

## 2020-11-18 NOTE — Telephone Encounter (Signed)
Pt came into office this morning asking if he could get more pain meds after surgery on Friday w/[facility].  He states he is still in pain.  I made appt to have stent removed this Thursday.  Janett Billow told pt to take ibuprofen or Tylenol for pain and added Larene Beach to secure chat.

## 2020-11-21 ENCOUNTER — Encounter: Payer: Self-pay | Admitting: Urology

## 2020-11-21 ENCOUNTER — Ambulatory Visit (INDEPENDENT_AMBULATORY_CARE_PROVIDER_SITE_OTHER): Payer: 59 | Admitting: Urology

## 2020-11-21 ENCOUNTER — Other Ambulatory Visit: Payer: Self-pay

## 2020-11-21 VITALS — BP 132/86 | HR 75 | Ht 73.0 in | Wt 230.0 lb

## 2020-11-21 DIAGNOSIS — N200 Calculus of kidney: Secondary | ICD-10-CM | POA: Diagnosis not present

## 2020-11-21 MED ORDER — SULFAMETHOXAZOLE-TRIMETHOPRIM 800-160 MG PO TABS: 1.0000 | ORAL_TABLET | Freq: Two times a day (BID) | ORAL | 0 refills | Status: AC

## 2020-11-21 NOTE — Progress Notes (Signed)
Cystoscopy Procedure Note:  Indication: Stent removal s/p 11/15/2020 right PCNL for 2.5 cm renal stone  UA today with WBCs and bacteria, no UTI symptoms or fever.  Risks and benefits of stent removal today versus delaying discussed at length, he requests proceeding with stent removal today.  Bactrim DS given for prophylaxis prior to stent removal.  After informed consent and discussion of the procedure and its risks, WEILAND SERRETTE was positioned and prepped in the standard fashion. Cystoscopy was performed with a flexible cystoscope. The stent was grasped with flexible graspers and removed in its entirety. The patient tolerated the procedure well.  Findings: Uncomplicated stent removal  Assessment and Plan: Urine sent for culture, Bactrim DS twice daily x5 days RTC with Dr. Bernardo Heater 6 months KUB   Billey Co, MD 11/21/2020

## 2020-11-22 LAB — CALCULI, WITH PHOTOGRAPH (CLINICAL LAB)
Calcium Oxalate Dihydrate: 20 %
Calcium Oxalate Monohydrate: 80 %
Weight Calculi: 3924 mg

## 2020-11-22 LAB — URINALYSIS, COMPLETE
Bilirubin, UA: NEGATIVE
Glucose, UA: NEGATIVE
Ketones, UA: NEGATIVE
Nitrite, UA: NEGATIVE
Specific Gravity, UA: >1.03 — ABNORMAL HIGH (ref 1.005–1.030)
Urobilinogen, Ur: 0.2 mg/dL (ref 0.2–1.0)
pH, UA: 5 (ref 5.0–7.5)

## 2020-11-22 LAB — MICROSCOPIC EXAMINATION: RBC, Urine: >30 /hpf — AB (ref 0–2)

## 2020-11-26 LAB — CULTURE, URINE COMPREHENSIVE

## 2020-12-02 ENCOUNTER — Ambulatory Visit: Payer: 59

## 2020-12-03 ENCOUNTER — Telehealth: Payer: Self-pay

## 2020-12-03 NOTE — Telephone Encounter (Signed)
Pt has left a Voicemail asking if he could get his Neuro Stimulator fixed or replaced it isn't charging and only one side is working.

## 2020-12-03 NOTE — Telephone Encounter (Signed)
Returned patient's phone call re; his stimulator issue.  I told him that I felt he needed to reach out to his representative which is [employer].  States he had talked with Manuela Schwartz and she wanted him to go to a clinic in [place].  Patient states he does not want to go to [facility].  States he has not seen [facility] in approx 5 years and wants to know if we are accepting new patient's  I told him that he is but that requires a process of being referred.  States he will reach back out to [place] and have her reach out to me.

## 2020-12-09 ENCOUNTER — Telehealth: Payer: Self-pay

## 2020-12-09 ENCOUNTER — Other Ambulatory Visit: Payer: Self-pay | Admitting: Physician Assistant

## 2020-12-09 DIAGNOSIS — F5101 Primary insomnia: Secondary | ICD-10-CM

## 2020-12-09 DIAGNOSIS — G4719 Other hypersomnia: Secondary | ICD-10-CM

## 2020-12-09 NOTE — Telephone Encounter (Signed)
Done

## 2020-12-09 NOTE — Telephone Encounter (Signed)
Done by lauren and pt aware

## 2020-12-09 NOTE — Telephone Encounter (Signed)
Done by lauren

## 2021-01-07 ENCOUNTER — Telehealth: Payer: Self-pay

## 2021-01-07 ENCOUNTER — Other Ambulatory Visit: Payer: Self-pay | Admitting: Physician Assistant

## 2021-01-07 DIAGNOSIS — G4719 Other hypersomnia: Secondary | ICD-10-CM

## 2021-01-07 NOTE — Telephone Encounter (Signed)
Send pres to phar  

## 2021-01-14 ENCOUNTER — Other Ambulatory Visit: Payer: Self-pay | Admitting: Physician Assistant

## 2021-01-14 DIAGNOSIS — F411 Generalized anxiety disorder: Secondary | ICD-10-CM

## 2021-01-14 DIAGNOSIS — F329 Major depressive disorder, single episode, unspecified: Secondary | ICD-10-CM

## 2021-01-15 ENCOUNTER — Other Ambulatory Visit: Payer: Self-pay | Admitting: Neurosurgery

## 2021-01-31 ENCOUNTER — Other Ambulatory Visit: Payer: Self-pay

## 2021-01-31 ENCOUNTER — Encounter
Admission: RE | Admit: 2021-01-31 | Discharge: 2021-01-31 | Disposition: A | Discharge status: Home / Self Care | Payer: 59 | Source: Ambulatory Visit | Attending: Neurosurgery | Admitting: Neurosurgery

## 2021-01-31 DIAGNOSIS — I10 Essential (primary) hypertension: Secondary | ICD-10-CM | POA: Diagnosis not present

## 2021-01-31 DIAGNOSIS — Z01818 Encounter for other preprocedural examination: Secondary | ICD-10-CM | POA: Insufficient documentation

## 2021-01-31 DIAGNOSIS — I2510 Atherosclerotic heart disease of native coronary artery without angina pectoris: Secondary | ICD-10-CM | POA: Diagnosis not present

## 2021-01-31 HISTORY — DX: Personal history of urinary calculi: Z87.442

## 2021-01-31 LAB — URINALYSIS, ROUTINE W REFLEX MICROSCOPIC
Bilirubin Urine: NEGATIVE
Glucose, UA: NEGATIVE mg/dL
Hgb urine dipstick: NEGATIVE
Ketones, ur: NEGATIVE mg/dL
Leukocytes,Ua: NEGATIVE
Nitrite: NEGATIVE
Protein, ur: NEGATIVE mg/dL
Specific Gravity, Urine: 1.01 (ref 1.005–1.030)
pH: 7 (ref 5.0–8.0)

## 2021-01-31 LAB — CBC
HCT: 38.7 % — ABNORMAL LOW (ref 39.0–52.0)
Hemoglobin: 13.1 g/dL (ref 13.0–17.0)
MCH: 30.3 pg (ref 26.0–34.0)
MCHC: 33.9 g/dL (ref 30.0–36.0)
MCV: 89.4 fL (ref 80.0–100.0)
Platelets: 277 10*3/uL (ref 150–400)
RBC: 4.33 MIL/uL (ref 4.22–5.81)
RDW: 13.9 % (ref 11.5–15.5)
WBC: 9.1 10*3/uL (ref 4.0–10.5)
nRBC: 0 % (ref 0.0–0.2)

## 2021-01-31 LAB — TYPE AND SCREEN
ABO/RH(D): O NEG
Antibody Screen: NEGATIVE

## 2021-01-31 LAB — BASIC METABOLIC PANEL
Anion gap: 9 (ref 5–15)
BUN: 19 mg/dL (ref 6–20)
CO2: 24 mmol/L (ref 22–32)
Calcium: 9 mg/dL (ref 8.9–10.3)
Chloride: 103 mmol/L (ref 98–111)
Creatinine, Ser: 0.67 mg/dL (ref 0.61–1.24)
GFR, Estimated: >60 mL/min (ref >60)
Glucose, Bld: 105 mg/dL — ABNORMAL HIGH (ref 70–99)
Potassium: 3.6 mmol/L (ref 3.5–5.1)
Sodium: 136 mmol/L (ref 135–145)

## 2021-01-31 LAB — SURGICAL PCR SCREEN
MRSA, PCR: POSITIVE — AB
Staphylococcus aureus: POSITIVE — AB

## 2021-01-31 LAB — PROTIME-INR
INR: 1 (ref 0.8–1.2)
Prothrombin Time: 13.5 seconds (ref 11.4–15.2)

## 2021-01-31 LAB — APTT: aPTT: 36 seconds (ref 24–36)

## 2021-01-31 NOTE — Progress Notes (Signed)
  Perioperative Services  Abnormal Lab Notification  Date: 01/31/21  Name: GURNOOR URSUA MRN:   [id number]  Re: Abnormal labs noted during PAT appointment  Provider Notified: Deetta Perla, MD Notification mode: Routed and/or faxed via [place] of concern: Lab Results  Component Value Date   STAPHAUREUS POSITIVE (A) 01/31/2021   MRSAPCR POSITIVE (A) 01/31/2021    Notes: Patient is scheduled for a INTRATHECAL PUMP IMPLANT; SPINAL CORD STIMULATOR BATTERY EXCHANGE; LUMBAR SPINAL CORD SIMULATOR LEAD REMOVAL on 02/10/2021. He is scheduled to receive CEFAZOLIN pre-operatively. Surgical PCR (+) for MRSA; see above.  PLANS:  Review renal function. Estimated Creatinine Clearance: 125 mL/min (by C-G formula based on SCr of 0.67 mg/dL). Review allergies. No documented allergy to vancomycin. CEFAZOLIN discontinued. Order added for VANCOMYCIN 1 GRAM IV to preoperative prophylactic regimen.  Notify primary attending surgeon of (+) MRSA result and additional order placed for antibiotic by perioperative APP.   This is a Community education officer; no formal response is required.  Honor Loh, MSN, APRN, FNP-C, CEN [facility]  Peri-operative Services Nurse Practitioner Phone: [phone] 01/31/21 1:24 PM

## 2021-01-31 NOTE — Pre-Procedure Instructions (Signed)
Instructed patient to bring his spinal cord stimulator remote control with him on the day of surgery.

## 2021-01-31 NOTE — Patient Instructions (Addendum)
Your procedure is scheduled on: Monday, October 24 Report to the Registration Desk on the 1st floor of the [facility]. To find out your arrival time, please call [phone] between 1PM - 3PM on: Friday, October 21  REMEMBER: Instructions that are not followed completely may result in serious medical risk, up to and including death; or upon the discretion of your surgeon and anesthesiologist your surgery may need to be rescheduled.  Do not eat food after midnight the night before surgery.  No gum chewing, lozengers or hard candies.  You may however, drink CLEAR liquids up to 2 hours before you are scheduled to arrive for your surgery. Do not drink anything within 2 hours of your scheduled arrival time.  Clear liquids include: - water  - apple juice without pulp - gatorade (not RED, PURPLE, OR BLUE) - black coffee or tea (Do NOT add milk or creamers to the coffee or tea) Do NOT drink anything that is not on this list.  TAKE THESE MEDICATIONS THE MORNING OF SURGERY WITH A SIP OF WATER:  Albuterol inhaler Amlodipine Bupropion  Carvedilol Duloxetine Levothyroxine Oxycodone if needed for pain Tamsulosin  Use inhalers on the day of surgery and bring to the hospital.  Follow recommendations from Cardiologist, Pulmonologist or PCP regarding stopping Aspirin. Stop Aspirin 7 days prior to surgery. Last day to take aspirin is October 16. Resume Aspirin 7 days after surgery. May start taking aspirin October 31.  One week prior to surgery: starting October 17 Stop Anti-inflammatories (NSAIDS) such as Advil, Aleve, Ibuprofen, Motrin, Naproxen, Naprosyn and Aspirin based products such as Excedrin, Goodys Powder, BC Powder. Stop ANY OVER THE COUNTER supplements until after surgery. Stop Elderberry, Vitamin D, Co-Q10, multiple vitamin, turmeric, Vitamin C You may however, continue to take Tylenol if needed for pain up until the day of surgery.  No Alcohol for 24 hours before or after  surgery.  No Smoking including e-cigarettes for 24 hours prior to surgery.  No chewable tobacco products for at least 6 hours prior to surgery.  No nicotine patches on the day of surgery.  Do not use any "recreational" drugs for at least a week prior to your surgery.  Please be advised that the combination of cocaine and anesthesia may have negative outcomes, up to and including death. If you test positive for cocaine, your surgery will be cancelled.  On the morning of surgery brush your teeth with toothpaste and water, you may rinse your mouth with mouthwash if you wish. Do not swallow any toothpaste or mouthwash.  Use CHG Soap as directed on instruction sheet.  Do not wear jewelry, make-up, hairpins, clips or nail polish.  Do not wear lotions, powders, or perfumes.   Do not shave body from the neck down 48 hours prior to surgery just in case you cut yourself which could leave a site for infection.  Also, freshly shaved skin may become irritated if using the CHG soap.  Contact lenses, hearing aids and dentures may not be worn into surgery.  Do not bring valuables to the hospital. [facility] is not responsible for any missing/lost belongings or valuables.   Notify your doctor if there is any change in your medical condition (cold, fever, infection).  Wear comfortable clothing (specific to your surgery type) to the hospital.  After surgery, you can help prevent lung complications by doing breathing exercises.  Take deep breaths and cough every 1-2 hours. Your doctor may order a device called an Chiropodist to  help you take deep breaths.  If you are being admitted to the hospital overnight, leave your suitcase in the car. After surgery it may be brought to your room.  If you are being discharged the day of surgery, you will not be allowed to drive home. You will need a responsible adult (18 years or older) to drive you home and stay with you that night.   If you are  taking public transportation, you will need to have a responsible adult (18 years or older) with you. Please confirm with your physician that it is acceptable to use public transportation.   Please call the [facility] at [phone] if you have any questions about these instructions.  Surgery Visitation Policy:  Patients undergoing a surgery or procedure may have one family member or support person with them as long as that person is not COVID-19 positive or experiencing its symptoms.  That person may remain in the waiting area during the procedure and may rotate out with other people.  Inpatient Visitation:    Visiting hours are 7 a.m. to 8 p.m. Up to two visitors ages 16+ are allowed at one time in a patient room. The visitors may rotate out with other people during the day. Visitors must check out when they leave, or other visitors will not be allowed. One designated support person may remain overnight. The visitor must pass COVID-19 screenings, use hand sanitizer when entering and exiting the patient's room and wear a mask at all times, including in the patient's room. Patients must also wear a mask when staff or their visitor are in the room. Masking is required regardless of vaccination status.

## 2021-02-03 ENCOUNTER — Encounter: Payer: Self-pay | Admitting: Neurosurgery
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
## 2021-02-06 ENCOUNTER — Other Ambulatory Visit
Admission: RE | Admit: 2021-02-06 | Discharge: 2021-02-06 | Disposition: A | Discharge status: Home / Self Care | Payer: 59 | Source: Ambulatory Visit | Attending: Neurosurgery | Admitting: Neurosurgery

## 2021-02-06 ENCOUNTER — Ambulatory Visit (INDEPENDENT_AMBULATORY_CARE_PROVIDER_SITE_OTHER): Payer: 59 | Admitting: Physician Assistant

## 2021-02-06 ENCOUNTER — Other Ambulatory Visit: Payer: Self-pay

## 2021-02-06 ENCOUNTER — Encounter: Payer: Self-pay | Admitting: Physician Assistant

## 2021-02-06 VITALS — BP 126/84 | HR 59 | Temp 98.6°F | Resp 16 | Ht 73.0 in | Wt 233.2 lb

## 2021-02-06 DIAGNOSIS — I10 Essential (primary) hypertension: Secondary | ICD-10-CM

## 2021-02-06 DIAGNOSIS — F5101 Primary insomnia: Secondary | ICD-10-CM

## 2021-02-06 DIAGNOSIS — G894 Chronic pain syndrome: Secondary | ICD-10-CM | POA: Diagnosis not present

## 2021-02-06 DIAGNOSIS — G4719 Other hypersomnia: Secondary | ICD-10-CM

## 2021-02-06 DIAGNOSIS — F329 Major depressive disorder, single episode, unspecified: Secondary | ICD-10-CM

## 2021-02-06 DIAGNOSIS — Z20822 Contact with and (suspected) exposure to covid-19: Secondary | ICD-10-CM

## 2021-02-06 DIAGNOSIS — Z01812 Encounter for preprocedural laboratory examination: Secondary | ICD-10-CM | POA: Diagnosis present

## 2021-02-06 DIAGNOSIS — F411 Generalized anxiety disorder: Secondary | ICD-10-CM

## 2021-02-06 LAB — SARS CORONAVIRUS 2 (TAT 6-24 HRS): SARS Coronavirus 2: NEGATIVE

## 2021-02-06 MED ORDER — ZOLPIDEM TARTRATE 10 MG PO TABS: 10.0000 mg | ORAL_TABLET | Freq: Every evening | ORAL | 2 refills | Status: DC | PRN

## 2021-02-06 MED ORDER — LISDEXAMFETAMINE DIMESYLATE 40 MG PO CAPS: 40.0000 mg | ORAL_CAPSULE | Freq: Every morning | ORAL | 0 refills | Status: DC
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
## 2021-02-07 ENCOUNTER — Ambulatory Visit: Payer: 59 | Admitting: Physician Assistant

## 2021-02-10 ENCOUNTER — Other Ambulatory Visit: Payer: Self-pay

## 2021-02-10 ENCOUNTER — Encounter: Payer: Self-pay | Admitting: Neurosurgery

## 2021-02-10 ENCOUNTER — Ambulatory Visit: Payer: 59

## 2021-02-10 ENCOUNTER — Ambulatory Visit: Payer: 59 | Admitting: Urgent Care

## 2021-02-10 ENCOUNTER — Encounter
Admission: RE | Disposition: A | Discharge status: Home / Self Care | Payer: Self-pay | Source: Home / Self Care | Attending: Neurosurgery

## 2021-02-10 ENCOUNTER — Ambulatory Visit
Admission: RE | Admit: 2021-02-10 | Discharge: 2021-02-10 | Disposition: A | Discharge status: Home / Self Care | Payer: 59 | Attending: Neurosurgery | Admitting: Neurosurgery

## 2021-02-10 DIAGNOSIS — Z981 Arthrodesis status: Secondary | ICD-10-CM | POA: Diagnosis not present

## 2021-02-10 DIAGNOSIS — I252 Old myocardial infarction: Secondary | ICD-10-CM | POA: Insufficient documentation

## 2021-02-10 DIAGNOSIS — I10 Essential (primary) hypertension: Secondary | ICD-10-CM | POA: Insufficient documentation

## 2021-02-10 DIAGNOSIS — F32A Depression, unspecified: Secondary | ICD-10-CM | POA: Diagnosis not present

## 2021-02-10 DIAGNOSIS — G4700 Insomnia, unspecified: Secondary | ICD-10-CM | POA: Insufficient documentation

## 2021-02-10 DIAGNOSIS — Z89421 Acquired absence of other right toe(s): Secondary | ICD-10-CM | POA: Diagnosis not present

## 2021-02-10 DIAGNOSIS — Z8616 Personal history of COVID-19: Secondary | ICD-10-CM | POA: Diagnosis not present

## 2021-02-10 DIAGNOSIS — M1990 Unspecified osteoarthritis, unspecified site: Secondary | ICD-10-CM | POA: Diagnosis not present

## 2021-02-10 DIAGNOSIS — Z7982 Long term (current) use of aspirin: Secondary | ICD-10-CM | POA: Diagnosis not present

## 2021-02-10 DIAGNOSIS — E785 Hyperlipidemia, unspecified: Secondary | ICD-10-CM | POA: Insufficient documentation

## 2021-02-10 DIAGNOSIS — E039 Hypothyroidism, unspecified: Secondary | ICD-10-CM | POA: Diagnosis not present

## 2021-02-10 DIAGNOSIS — G894 Chronic pain syndrome: Secondary | ICD-10-CM | POA: Insufficient documentation

## 2021-02-10 DIAGNOSIS — I2510 Atherosclerotic heart disease of native coronary artery without angina pectoris: Secondary | ICD-10-CM | POA: Diagnosis not present

## 2021-02-10 DIAGNOSIS — Z79890 Hormone replacement therapy (postmenopausal): Secondary | ICD-10-CM | POA: Insufficient documentation

## 2021-02-10 DIAGNOSIS — Z9884 Bariatric surgery status: Secondary | ICD-10-CM | POA: Insufficient documentation

## 2021-02-10 DIAGNOSIS — N529 Male erectile dysfunction, unspecified: Secondary | ICD-10-CM | POA: Diagnosis not present

## 2021-02-10 DIAGNOSIS — F419 Anxiety disorder, unspecified: Secondary | ICD-10-CM | POA: Insufficient documentation

## 2021-02-10 DIAGNOSIS — Z888 Allergy status to other drugs, medicaments and biological substances status: Secondary | ICD-10-CM | POA: Diagnosis not present

## 2021-02-10 DIAGNOSIS — Z79899 Other long term (current) drug therapy: Secondary | ICD-10-CM | POA: Insufficient documentation

## 2021-02-10 DIAGNOSIS — Z419 Encounter for procedure for purposes other than remedying health state, unspecified: Secondary | ICD-10-CM

## 2021-02-10 DIAGNOSIS — J45909 Unspecified asthma, uncomplicated: Secondary | ICD-10-CM | POA: Diagnosis not present

## 2021-02-10 HISTORY — DX: Presence of other specified functional implants: Z96.89

## 2021-02-10 HISTORY — PX: INTRATHECAL PUMP IMPLANT: SHX6809

## 2021-02-10 HISTORY — PX: SPINAL CORD STIMULATOR BATTERY EXCHANGE: SHX6202

## 2021-02-10 HISTORY — DX: Polyp of colon: K63.5

## 2021-02-10 HISTORY — DX: Hyperlipidemia, unspecified: E78.5

## 2021-02-10 SURGERY — INTRATHECAL PUMP IMPLANT
Anesthesia: General

## 2021-02-10 MED ORDER — CHLORHEXIDINE GLUCONATE 0.12 % MT SOLN
OROMUCOSAL | Status: AC
  Administered 2021-02-10: 15 mL via OROMUCOSAL
  Filled 2021-02-10: qty 15

## 2021-02-10 MED ORDER — FENTANYL CITRATE (PF) 100 MCG/2ML IJ SOLN: 25.0000 ug | INTRAMUSCULAR | Status: DC | PRN

## 2021-02-10 MED ORDER — VANCOMYCIN HCL IN DEXTROSE 1-5 GM/200ML-% IV SOLN
INTRAVENOUS | Status: AC
  Administered 2021-02-10: 1000 mg via INTRAVENOUS
  Filled 2021-02-10: qty 200

## 2021-02-10 MED ORDER — OXYCODONE HCL 5 MG PO TABS
5.0000 mg | ORAL_TABLET | ORAL | Status: AC
  Administered 2021-02-10: 5 mg via ORAL

## 2021-02-10 MED ORDER — SODIUM CHLORIDE FLUSH 0.9 % IV SOLN
INTRAVENOUS | Status: AC
  Filled 2021-02-10: qty 40

## 2021-02-10 MED ORDER — LACTATED RINGERS IV SOLN: INTRAVENOUS | Status: DC

## 2021-02-10 MED ORDER — PHENYLEPHRINE HCL (PRESSORS) 10 MG/ML IV SOLN
INTRAVENOUS | Status: DC | PRN
  Administered 2021-02-10: 160 ug via INTRAVENOUS
  Administered 2021-02-10: 100 ug via INTRAVENOUS

## 2021-02-10 MED ORDER — DEXMEDETOMIDINE (PRECEDEX) IN NS 20 MCG/5ML (4 MCG/ML) IV SYRINGE
PREFILLED_SYRINGE | INTRAVENOUS | Status: DC | PRN
  Administered 2021-02-10: 20 ug via INTRAVENOUS

## 2021-02-10 MED ORDER — DEXMEDETOMIDINE HCL IN NACL 400 MCG/100ML IV SOLN
INTRAVENOUS | Status: DC | PRN
  Administered 2021-02-10: .2 ug/kg/h via INTRAVENOUS

## 2021-02-10 MED ORDER — ORAL CARE MOUTH RINSE: 15.0000 mL | Freq: Once | OROMUCOSAL | Status: AC

## 2021-02-10 MED ORDER — BUPIVACAINE-EPINEPHRINE (PF) 0.5% -1:200000 IJ SOLN
INTRAMUSCULAR | Status: DC | PRN
  Administered 2021-02-10: 28 mL

## 2021-02-10 MED ORDER — MIDAZOLAM HCL 2 MG/2ML IJ SOLN
INTRAMUSCULAR | Status: AC
  Filled 2021-02-10: qty 2

## 2021-02-10 MED ORDER — METHOCARBAMOL 500 MG PO TABS: 500.0000 mg | ORAL_TABLET | Freq: Three times a day (TID) | ORAL | 0 refills | Status: DC | PRN

## 2021-02-10 MED ORDER — EPHEDRINE SULFATE 50 MG/ML IJ SOLN
INTRAMUSCULAR | Status: DC | PRN
  Administered 2021-02-10: 10 mg via INTRAVENOUS
  Administered 2021-02-10: 5 mg via INTRAVENOUS
  Administered 2021-02-10: 10 mg via INTRAVENOUS

## 2021-02-10 MED ORDER — FENTANYL CITRATE (PF) 100 MCG/2ML IJ SOLN
INTRAMUSCULAR | Status: AC
  Filled 2021-02-10: qty 2

## 2021-02-10 MED ORDER — VANCOMYCIN HCL 1000 MG IV SOLR
1000.0000 mg | Freq: Once | INTRAVENOUS | Status: DC
  Filled 2021-02-10: qty 20

## 2021-02-10 MED ORDER — 0.9 % SODIUM CHLORIDE (POUR BTL) OPTIME
TOPICAL | Status: DC | PRN
  Administered 2021-02-10: 1000 mL

## 2021-02-10 MED ORDER — PROPOFOL 10 MG/ML IV BOLUS
INTRAVENOUS | Status: AC
  Filled 2021-02-10: qty 40

## 2021-02-10 MED ORDER — CHLORHEXIDINE GLUCONATE 0.12 % MT SOLN: 15.0000 mL | Freq: Once | OROMUCOSAL | Status: AC

## 2021-02-10 MED ORDER — OXYCODONE HCL 5 MG PO TABS
ORAL_TABLET | ORAL | Status: AC
  Filled 2021-02-10: qty 1

## 2021-02-10 MED ORDER — KETAMINE HCL 100 MG/ML IJ SOLN
INTRAMUSCULAR | Status: DC | PRN
  Administered 2021-02-10 (×2): 10 mg via INTRAMUSCULAR
  Administered 2021-02-10: 50 mg via INTRAMUSCULAR
  Administered 2021-02-10: 30 mg via INTRAMUSCULAR

## 2021-02-10 MED ORDER — METHOCARBAMOL 500 MG PO TABS
ORAL_TABLET | ORAL | Status: AC
  Administered 2021-02-10: 500 mg
  Filled 2021-02-10: qty 1

## 2021-02-10 MED ORDER — BUPIVACAINE-EPINEPHRINE (PF) 0.5% -1:200000 IJ SOLN
INTRAMUSCULAR | Status: AC
  Filled 2021-02-10: qty 30

## 2021-02-10 MED ORDER — FENTANYL CITRATE (PF) 100 MCG/2ML IJ SOLN
INTRAMUSCULAR | Status: DC | PRN
  Administered 2021-02-10 (×2): 50 ug via INTRAVENOUS
  Administered 2021-02-10: 100 ug via INTRAVENOUS

## 2021-02-10 MED ORDER — PHENYLEPHRINE HCL-NACL 20-0.9 MG/250ML-% IV SOLN
INTRAVENOUS | Status: DC | PRN
  Administered 2021-02-10: 50 ug/min via INTRAVENOUS

## 2021-02-10 MED ORDER — PHENYLEPHRINE HCL (PRESSORS) 10 MG/ML IV SOLN
INTRAVENOUS | Status: AC
  Filled 2021-02-10: qty 2

## 2021-02-10 MED ORDER — FAMOTIDINE 20 MG PO TABS
ORAL_TABLET | ORAL | Status: AC
  Administered 2021-02-10: 20 mg via ORAL
  Filled 2021-02-10: qty 1

## 2021-02-10 MED ORDER — MIDAZOLAM HCL 2 MG/2ML IJ SOLN
INTRAMUSCULAR | Status: DC | PRN
  Administered 2021-02-10: 4 mg via INTRAVENOUS

## 2021-02-10 MED ORDER — VANCOMYCIN HCL 1000 MG IV SOLR
INTRAVENOUS | Status: DC | PRN
  Administered 2021-02-10: 1000 mg via TOPICAL

## 2021-02-10 MED ORDER — VANCOMYCIN HCL 1000 MG IV SOLR
INTRAVENOUS | Status: AC
  Filled 2021-02-10: qty 20

## 2021-02-10 MED ORDER — VANCOMYCIN HCL IN DEXTROSE 1-5 GM/200ML-% IV SOLN: 1000.0000 mg | INTRAVENOUS | Status: AC

## 2021-02-10 MED ORDER — PROPOFOL 10 MG/ML IV BOLUS
INTRAVENOUS | Status: DC | PRN
  Administered 2021-02-10 (×2): 100 mg via INTRAVENOUS
  Administered 2021-02-10: 200 mg via INTRAVENOUS
  Administered 2021-02-10: 100 mg via INTRAVENOUS

## 2021-02-10 MED ORDER — REMIFENTANIL HCL 1 MG IV SOLR
INTRAVENOUS | Status: DC | PRN
  Administered 2021-02-10: .5 ug/kg/min via INTRAVENOUS

## 2021-02-10 MED ORDER — PHENYLEPHRINE HCL (PRESSORS) 10 MG/ML IV SOLN
INTRAVENOUS | Status: AC
  Filled 2021-02-10: qty 1

## 2021-02-10 MED ORDER — PROPOFOL 10 MG/ML IV BOLUS
INTRAVENOUS | Status: AC
  Filled 2021-02-10: qty 20

## 2021-02-10 MED ORDER — OXYCODONE HCL 5 MG PO TABS
5.0000 mg | ORAL_TABLET | Freq: Three times a day (TID) | ORAL | Status: DC | PRN
  Administered 2021-02-10: 5 mg via ORAL

## 2021-02-10 MED ORDER — SUCCINYLCHOLINE CHLORIDE 200 MG/10ML IV SOSY
PREFILLED_SYRINGE | INTRAVENOUS | Status: DC | PRN
  Administered 2021-02-10: 100 mg via INTRAVENOUS

## 2021-02-10 MED ORDER — PROMETHAZINE HCL 25 MG/ML IJ SOLN: 6.2500 mg | INTRAMUSCULAR | Status: DC | PRN

## 2021-02-10 MED ORDER — LIDOCAINE HCL (CARDIAC) PF 100 MG/5ML IV SOSY
PREFILLED_SYRINGE | INTRAVENOUS | Status: DC | PRN
  Administered 2021-02-10: 100 mg via INTRAVENOUS

## 2021-02-10 MED ORDER — CEFAZOLIN SODIUM-DEXTROSE 1-4 GM/50ML-% IV SOLN
INTRAVENOUS | Status: DC | PRN
  Administered 2021-02-10: 2 g via INTRAVENOUS

## 2021-02-10 MED ORDER — FAMOTIDINE 20 MG PO TABS: 20.0000 mg | ORAL_TABLET | Freq: Once | ORAL | Status: AC

## 2021-02-10 MED ORDER — ONDANSETRON HCL 4 MG/2ML IJ SOLN
INTRAMUSCULAR | Status: DC | PRN
  Administered 2021-02-10: 4 mg via INTRAVENOUS

## 2021-02-10 MED ORDER — BUPIVACAINE HCL (PF) 0.5 % IJ SOLN
INTRAMUSCULAR | Status: AC
  Filled 2021-02-10: qty 30

## 2021-02-10 MED ORDER — KETAMINE HCL 50 MG/5ML IJ SOSY
PREFILLED_SYRINGE | INTRAMUSCULAR | Status: AC
  Filled 2021-02-10: qty 5

## 2021-02-10 MED ORDER — OXYCODONE HCL 5 MG PO TABS: 5.0000 mg | ORAL_TABLET | Freq: Three times a day (TID) | ORAL | 0 refills | Status: AC | PRN

## 2021-02-10 SURGICAL SUPPLY — 73 items
BINDER ABDOMINAL 12 ML 46-62 (SOFTGOODS) IMPLANT
BLADE SURG 15 STRL LF DISP TIS (BLADE) ×1 IMPLANT
BLADE SURG 15 STRL SS (BLADE) ×2
CATH ASCENDA 1PIECE (Catheter) ×2 IMPLANT
CHLORAPREP W/TINT 26 (MISCELLANEOUS) ×6 IMPLANT
CNTNR SPEC 2.5X3XGRAD LEK (MISCELLANEOUS) ×2
CONT SPEC 4OZ STER OR WHT (MISCELLANEOUS) ×2
CONT SPEC 4OZ STRL OR WHT (MISCELLANEOUS) ×2
CONTAINER SPEC 2.5X3XGRAD LEK (MISCELLANEOUS) ×2 IMPLANT
COUNTER NEEDLE 20/40 LG (NEEDLE) ×2 IMPLANT
COVER LIGHT HANDLE STERIS (MISCELLANEOUS) ×4 IMPLANT
DERMABOND ADVANCED (GAUZE/BANDAGES/DRESSINGS) ×1
DERMABOND ADVANCED .7 DNX12 (GAUZE/BANDAGES/DRESSINGS) ×1 IMPLANT
DEVICE IMPLANT NEUROSTIMULATOR (Neuro Prosthesis/Implant) ×2 IMPLANT
DRAPE C-ARM 42X72 X-RAY (DRAPES) ×4 IMPLANT
DRAPE C-ARM XRAY 36X54 (DRAPES) ×4 IMPLANT
DRAPE C-ARMOR (DRAPES) IMPLANT
DRAPE LAPAROTOMY 100X77 ABD (DRAPES) ×2 IMPLANT
DRAPE SURG 17X11 SM STRL (DRAPES) ×8 IMPLANT
DRSG TEGADERM 4X4.75 (GAUZE/BANDAGES/DRESSINGS) ×6 IMPLANT
DRSG TELFA 4X3 1S NADH ST (GAUZE/BANDAGES/DRESSINGS) ×4 IMPLANT
ELECT CAUTERY BLADE TIP 2.5 (TIP) ×2
ELECT REM PT RETURN 9FT ADLT (ELECTROSURGICAL) ×2
ELECTRODE CAUTERY BLDE TIP 2.5 (TIP) ×1 IMPLANT
ELECTRODE REM PT RTRN 9FT ADLT (ELECTROSURGICAL) ×1 IMPLANT
GAUZE 4X4 16PLY ~~LOC~~+RFID DBL (SPONGE) ×2 IMPLANT
GAUZE SPONGE 4X4 12PLY STRL (GAUZE/BANDAGES/DRESSINGS) ×2 IMPLANT
GAUZE XEROFORM 1X8 LF (GAUZE/BANDAGES/DRESSINGS) ×2 IMPLANT
GLOVE SRG 8 PF TXTR STRL LF DI (GLOVE) ×2 IMPLANT
GLOVE SURG SYN 6.5 ES PF (GLOVE) ×4 IMPLANT
GLOVE SURG SYN 8.0 (GLOVE) ×4 IMPLANT
GLOVE SURG UNDER POLY LF SZ6.5 (GLOVE) ×4 IMPLANT
GLOVE SURG UNDER POLY LF SZ8 (GLOVE) ×4
GOWN SRG LRG LVL 4 IMPRV REINF (GOWNS) ×2 IMPLANT
GOWN STRL REIN LRG LVL4 (GOWNS) ×4
GOWN STRL REUS W/ TWL XL LVL3 (GOWN DISPOSABLE) ×1 IMPLANT
GOWN STRL REUS W/TWL XL LVL3 (GOWN DISPOSABLE) ×2
GRADUATE 1200CC STRL 31836 (MISCELLANEOUS) ×2 IMPLANT
KIT NEURO ACCESSORY W/WRENCH (MISCELLANEOUS) ×2 IMPLANT
KIT REFILL (MISCELLANEOUS) ×1
KIT REFILL CATH SYNCHROMED II (MISCELLANEOUS) ×1 IMPLANT
KIT TURNOVER KIT A (KITS) ×2 IMPLANT
MANAGER PERSONAL PAT THERAPY (MISCELLANEOUS) ×2 IMPLANT
MANIFOLD NEPTUNE II (INSTRUMENTS) ×2 IMPLANT
MARKER SKIN DUAL TIP RULER LAB (MISCELLANEOUS) ×4 IMPLANT
NEEDLE HYPO 22GX1.5 SAFETY (NEEDLE) ×2 IMPLANT
NS IRRIG 1000ML POUR BTL (IV SOLUTION) ×2 IMPLANT
PACK BASIC III (MISCELLANEOUS) ×2
PACK LAMINECTOMY NEURO (CUSTOM PROCEDURE TRAY) ×2 IMPLANT
PACK SRG BSC III STRL LF (MISCELLANEOUS) ×1 IMPLANT
PACK UNIVERSAL (MISCELLANEOUS) ×2 IMPLANT
PAD ARMBOARD 7.5X6 YLW CONV (MISCELLANEOUS) ×2 IMPLANT
PASSER CATH 38CM DISP (INSTRUMENTS) ×4 IMPLANT
PENCIL ELECTRO HAND CTR (MISCELLANEOUS) ×2 IMPLANT
POUCH TYRX NEURO LRG (Mesh General) ×2 IMPLANT
PUMP SYNCHROMED II 40ML RESVR (Neuro Prosthesis/Implant) ×2 IMPLANT
REPROGRAMMER INTELLIS (NEUROSURGERY SUPPLIES) ×2 IMPLANT
SPONGE T-LAP 4X18 ~~LOC~~+RFID (SPONGE) ×4 IMPLANT
STAPLER SKIN PROX 35W (STAPLE) ×2 IMPLANT
STIMULATOR WIRELESS 74X79X20MM (MISCELLANEOUS) ×2 IMPLANT
SURGIFLO W/THROMBIN 8M KIT (HEMOSTASIS) ×2 IMPLANT
SUT ETHILON 3-0 FS-10 30 BLK (SUTURE) ×4
SUT POLYSORB 2-0 5X18 GS-10 (SUTURE) ×12 IMPLANT
SUT SILK 2 0 PERMA HAND 18 BK (SUTURE) IMPLANT
SUT SILK 2 0 SH (SUTURE) ×6 IMPLANT
SUT VIC AB 0 CT1 18XCR BRD 8 (SUTURE) ×3 IMPLANT
SUT VIC AB 0 CT1 8-18 (SUTURE) ×6
SUTURE EHLN 3-0 FS-10 30 BLK (SUTURE) ×2 IMPLANT
SYR 20ML LL LF (SYRINGE) ×4 IMPLANT
TAPE TRANSPORE STRL 2 31045 (GAUZE/BANDAGES/DRESSINGS) ×2 IMPLANT
TOWEL OR 17X26 4PK STRL BLUE (TOWEL DISPOSABLE) ×4 IMPLANT
TUBING CONNECTING 10 (TUBING) ×2 IMPLANT
WATER STERILE IRR 500ML POUR (IV SOLUTION) ×2 IMPLANT

## 2021-02-10 NOTE — Anesthesia Procedure Notes (Signed)
Procedure Name: Intubation Date/Time: 02/10/2021 7:36 AM Performed by: Juliene Pina, CRNA Pre-anesthesia Checklist: Patient identified, Patient being monitored, Timeout performed, Emergency Drugs available and Suction available Patient Re-evaluated:Patient Re-evaluated prior to induction Oxygen Delivery Method: Circle system utilized Preoxygenation: Pre-oxygenation with 100% oxygen Induction Type: IV induction Ventilation: Mask ventilation with difficulty Laryngoscope Size: Mac and 4 Grade View: Grade I Tube type: Oral Tube size: 7.5 mm Number of attempts: 1 Airway Equipment and Method: Stylet Placement Confirmation: ETT inserted through vocal cords under direct vision, positive ETCO2 and breath sounds checked- equal and bilateral Secured at: 23 cm Tube secured with: Tape Dental Injury: Teeth and Oropharynx as per pre-operative assessment

## 2021-02-10 NOTE — Discharge Summary (Signed)
Physician Discharge Summary  Patient ID: Charles [name] MRN: [id number] DOB/AGE: 01/16/1961 60 y.o.  Admit date: 02/10/2021 Discharge date: 02/10/2021  Admission Diagnoses: chronic pain  Discharge Diagnoses:  Active Problems:   * No active hospital problems. *   Discharged Condition: good  Hospital Course: Charles [name] is a 60 y.o presenting for placement of intrathecal pain pump and replacement of SCS pulse generator. His interoperative course was medically uncomplicated. He was monitored in PACU post-op for 5 hours for signs of CSF leak and was discharged home after ambulating, urinating, and tolerating PO intake with Oxycodone 5mg  for breakthrough in addition to his home Oxycodone 10mg . He was also given Robaxin as needed for muscle spasms.   Consults: None  Significant Diagnostic Studies: none  Treatments: surgery: As above. See detailed operative report for further details.   Discharge Exam: Blood pressure 124/87, pulse 86, temperature 97.8 F (36.6 C), temperature source Temporal, resp. rate 16, height 6\' 1"  (1.854 m), weight 105.8 kg, SpO2 99 %. CN II-XII grossly intact Good and equal strength throughout  Disposition: Discharge disposition: 01-Home or Self Care       Discharge Instructions     Diet - low sodium heart healthy   Complete by: As directed    Increase activity slowly   Complete by: As directed       Allergies as of 02/10/2021       Reactions   Lisinopril Cough        Medication List     STOP taking these medications    aspirin EC 81 MG tablet       TAKE these medications    albuterol 108 (90 Base) MCG/ACT inhaler Commonly known as: VENTOLIN HFA Inhale 2 puffs into the lungs every 6 (six) hours as needed for wheezing or shortness of breath.   amLODipine 5 MG tablet Commonly known as: NORVASC Take 1 tablet (5 mg total) by mouth daily.   atorvastatin 40 MG tablet Commonly known as: LIPITOR Take one tab at bed time for  cholesterol   B-12 PO Take 1,000 mcg by mouth daily.   Biotin 5 MG Caps Take 5 mg by mouth daily.   buPROPion 150 MG 24 hr tablet Commonly known as: Wellbutrin XL Take 1 tablet (150 mg total) by mouth daily.   busPIRone 30 MG tablet Commonly known as: BUSPAR Take 1 tablet (30 mg total) by mouth 3 (three) times daily as needed.   carvedilol 12.5 MG tablet Commonly known as: COREG Take 1 tablet (12.5 mg total) by mouth 2 (two) times daily with a meal.   Co Q-10 200 MG Caps Take 200 mg by mouth daily.   DULoxetine 60 MG capsule Commonly known as: CYMBALTA TAKE 1 CAPSULE BY MOUTH DAILY.   ferrous sulfate 325 (65 FE) MG tablet Take 1 tablet (325 mg total) by mouth daily with breakfast.   levothyroxine 75 MCG tablet Commonly known as: SYNTHROID Take 1 tablet (75 mcg total) by mouth daily before breakfast.   lisdexamfetamine 40 MG capsule Commonly known as: Vyvanse Take 1 capsule (40 mg total) by mouth every morning.   methocarbamol 500 MG tablet Commonly known as: Robaxin Take 1 tablet (500 mg total) by mouth every 8 (eight) hours as needed for muscle spasms.   multivitamin with minerals Tabs tablet Take 1 tablet by mouth daily.   naloxegol oxalate 25 MG Tabs tablet Commonly known as: Movantik Take 1 tablet (25 mg total) by mouth daily. What changed:  when to  take this reasons to take this   ondansetron 8 MG tablet Commonly known as: ZOFRAN TAKE 1 TABLET BY MOUTH 3 TIMES DAILY AS NEEDED FOR NAUSEA   Oxycodone HCl 10 MG Tabs Take 10 mg by mouth every 4 (four) hours as needed. What changed: Another medication with the same name was added. Make sure you understand how and when to take each.   oxyCODONE 5 MG immediate release tablet Commonly known as: Roxicodone Take 1 tablet (5 mg total) by mouth 3 (three) times daily as needed for up to 7 days for severe pain or breakthrough pain (pain refractory to home Oxycodone 10mg ). What changed: You were already taking a  medication with the same name, and this prescription was added. Make sure you understand how and when to take each.   polycarbophil 625 MG tablet Commonly known as: FIBERCON Take 625 mg by mouth daily.   polyethylene glycol 17 g packet Commonly known as: MIRALAX / GLYCOLAX Take 17 g by mouth daily as needed for mild constipation.   SAMBUCUS ELDERBERRY PO Take 2 tablets by mouth daily.   sildenafil 20 MG tablet Commonly known as: REVATIO Take 1 tablet (20 mg total) by mouth daily as needed (ED).   tamsulosin 0.4 MG Caps capsule Commonly known as: FLOMAX Take 1 capsule (0.4 mg total) by mouth daily.   TURMERIC PO Take 1 capsule by mouth daily.   vitamin C 500 MG tablet Commonly known as: ASCORBIC ACID Take 500 mg by mouth daily.   Vitamin D 125 MCG (5000 UT) Caps Take 5,000 Units by mouth daily.   zolpidem 10 MG tablet Commonly known as: AMBIEN Take 1 tablet (10 mg total) by mouth at bedtime as needed for sleep.        Follow-up Information     Loleta Dicker, PA Follow up in 2 week(s).   Why: For inicison check and suture removal. This appointment should already be scheduled with the [facility] clinic. Please call with questions regarding appointment date or time Contact information: [place] [address] [phone]                 Signed: Loleta Dicker 02/10/2021, 9:50 AM

## 2021-02-10 NOTE — Transfer of Care (Signed)
Immediate Anesthesia Transfer of Care Note  Patient: Charles [name]  Procedure(s) Performed: INTRATHECAL PUMP IMPLANT SPINAL CORD STIMULATOR BATTERY EXCHANGE  Patient Location: PACU  Anesthesia Type:General  Level of Consciousness: drowsy  Airway & Oxygen Therapy: Patient Spontanous Breathing and Patient connected to face mask oxygen  Post-op Assessment: Report given to RN and Post -op Vital signs reviewed and stable  Post vital signs: stable  Last Vitals:  Vitals Value Taken Time  BP 126/82 02/10/21 0950  Temp 37.1 C 02/10/21 0950  Pulse 87 02/10/21 0958  Resp 13 02/10/21 0958  SpO2 97 % 02/10/21 0958  Vitals shown include unvalidated device data.  Last Pain:  Vitals:   02/10/21 0631  TempSrc: Temporal  PainSc: 7          Complications: No notable events documented.

## 2021-02-10 NOTE — Interval H&P Note (Signed)
History and Physical Interval Note:  02/10/2021 6:41 AM  Charles [name]  has presented today for surgery, with the diagnosis of chronic pain syndrome g89.4.  The various methods of treatment have been discussed with the patient and family. After consideration of risks, benefits and other options for treatment, the patient has consented to  Procedure(s): INTRATHECAL PUMP IMPLANT (N/A) [place] (N/A) LUMBAR SPINAL CORD SIMULATOR LEAD REMOVAL (N/A) as a surgical intervention.  The patient's history has been reviewed, patient examined, no change in status, stable for surgery.  I have reviewed the patient's chart and labs.  Questions were answered to the patient's satisfaction.     Deetta Perla

## 2021-02-10 NOTE — Discharge Instructions (Addendum)
NEUROSURGERY DISCHARGE INSTRUCTIONS  Admission diagnosis: chronic pain syndrome g89.4  Operative procedure: Placement of pain pump and SCS pulse generator  What to do after you leave the hospital:  Recommended diet: regular diet. Increase protein intake to promote wound healing.  Recommended activity: no lifting, driving, or strenuous exercise for 4 weeks .You should walk multiple times per day  Special Instructions  No straining, no heavy lifting > 10lbs x 4 weeks.  Keep incision area clean and dry. May shower in 2 days. No baths or pools for 6 weeks.  Please remove dressing tomorrow, no need to apply a bandage afterwards  You have sutures or staples that will be removed in clinic.   Please take pain medications as directed. Take a stool softener if on pain medications  You may resume your home Aspirin on post-op day 7  Please Report any of the following: Nausea or Vomiting, Temperature is greater than 101.110F (38.1C) degrees, Dizziness, Abdominal Pain, Difficulty Breathing or Shortness of Breath, Inability to Eat, drink Fluids, or Take medications, Bleeding, swelling, or drainage from surgical incision sites, New numbness or weakness, and Bowel or bladder dysfunction to the neurosurgeon on call at [phone]  Additional Follow up appointments Please follow up with Cooper Render in [place] clinic as scheduled in 2-3 weeks   Please see below for scheduled appointments:  Future Appointments  Date Time Provider [place]  05/08/2021  8:20 AM McDonough, Si Gaul, PA-C [facility] None  05/22/2021  8:30 AM Stoioff, Ronda Fairly, MD [facility] None     AMBULATORY SURGERY  DISCHARGE INSTRUCTIONS   The drugs that you were given will stay in your system until tomorrow so for the next 24 hours you should not:  Drive an automobile Make any legal decisions Drink any alcoholic beverage   You may resume regular meals tomorrow.  Today it is better to start with liquids and gradually  work up to solid foods.  You may eat anything you prefer, but it is better to start with liquids, then soup and crackers, and gradually work up to solid foods.   Please notify your doctor immediately if you have any unusual bleeding, trouble breathing, redness and pain at the surgery site, drainage, fever, or pain not relieved by medication.    Your post-operative visit with Dr.                                       is: Date:                        Time:    Please call to schedule your post-operative visit.  Additional Instructions:

## 2021-02-10 NOTE — OR Nursing (Signed)
Discharge criteria met, d/c pending 5 hr instructions (4pm).

## 2021-02-10 NOTE — OR Nursing (Signed)
Dr. Lacinda Axon in to see pt in postop, advises does not need abd binder.
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
## 2021-02-10 NOTE — Op Note (Signed)
Operative Note   SURGERY DATE:  02/10/2021   PRE-OP DIAGNOSIS:  Chronic pain syndrome   POST-OP DIAGNOSIS: Post-Op Diagnosis Codes: Chronic pain syndrome   Procedure(s) with comments: Lumbar intrathecal catheter placement Right lower quadrant abdominal pump placement  Replacement of Spinal Cord Stimulator Pulse Generator   SURGEON:     * Malen Gauze, MD        Cooper Render, PA, Assisting   ANESTHESIA: General    OPERATIVE FINDINGS: Successful placement of intrathecal pain pump and catheter, Replacement of pulse generator   Indication Mr Zoll was seen in clinic on 9/13 after having a successful intrathecal pain medication injection for back and leg pain.  He had a known history of a prior thoracic fusion and SCS placement. The SCS had worked until battery died the year prior. He got good relief from that when it was working.  The patient wished to proceed to permanent pump placement and SCS battery replacement to achieve better pain control. Risks including  weakness, hematoma, infection, failure of pain relief, post-operative pain, need for revision, stroke, heart attack, pneumonia, and spinal cord injury were discussed.      Procedure The patient was brought to the operating room where vascular access was obtained and he was intubated by the anesthesia service.  Antibiotics were given.  The patient was placed in a lateral position with the right side up.. Fluoroscopy was used to confirm planned incision in the lumbar area. The patient was prepped and draped in a sterile fashion ensuring both the lumbar and abdominal incisions were incorporated in the sterile field. The right flank pulse generator incision was also prepped. A hard time out was performed. Local anesthetic was instilled into incisions.   The pulse generator incision was opened sharply.  Blunt dissection was used to expose the battery.  The incision was widened to expose the battery fully and it was removed.  The  leads were removed from the battery without complication.  The leads were hooked up to an external power source but impedances were not able to be found due to malfunction of the device.  Decision was made to attempt interrogation with a new battery.  The leads were again hooked up to this pulse generator but the leads were unable to be calibrated.  The decision was made to install the pulse generator with the option for the patient to do a lead revision in the future.  The lumbar incision was opened sharply over the midline and continued down to the fascia of the L3 level. Fluoroscopy was used to confirm an entry point to allow for intrathecal access at L2-3.  A Touhy needle was placed through the fascia and advanced over the lamina until clear fluid was obtained.  Next, the intrathecal catheter was placed through the Touhy needle and fluoroscopy used to ensure that it rested at the T8 level.  The needle was then removed.  Clear CSF was seen to flow from the catheter.  The anchoring device was placed around the catheter and secured to the fascia.  The catheter was then coiled above the fascia.   The pump was filled with saline.     Next, the right abdominal incision was opened and taken 2 cm deep through the subcutaneous tissue where the fascia was identified there.  The pocket for the pump placement was expanded inferiorly.  Once this was done, the tunneling device was used to tunneled the catheter from the lumbar to the abdominal incision.  Once  this was done, the catheter was connected to the extension and then to the pump.  A needle was placed in the pump to aspirate CSF to ensure flow.  The pump was then placed within the pocket and secured with silk sutures over an antibiotic pouch.    Next, hemostasis was achieved. Each incision was irrigated with saline and vancomycin powder placed. Then, each incision was closed with combination of 0, 2-0 vicryls. The skin was closed with 3-0 Nylon in the lumbar  area and Dermabond on the abdomen and right flank. Sterile dressings were applied. The patient was returned to supine position and extubated. The patient was seen to be moving lower extremities symmetrically and was taken to PACU for recovery. The family was updated and all questions answered.    ESTIMATED BLOOD LOSS:   50 cc   SPECIMENS None   IMPLANT  CATH ASCENDA 1PIECE - [id number]  Inventory Item: CATH ASCENDA 1PIECE Serial no.:  Model/Cat no.: [id number]  Implant name: CATH ASCENDA 1PIECE - [id number] Laterality: N/A Area: Back   Manufacturer: MEDTRONIC NEUROMOD PAIN MGMT Date of Manufacture:    Action: Implanted Number Used: 1   Device Identifier:  Device Identifier Type:     PUMP SYNCHROMED II 40ML RESVR - [id number]  Inventory Item: PUMP SYNCHROMED II 40ML RESVR Serial no.: [id number] Model/Cat no.: [id number]  Implant name: PUMP SYNCHROMED II 40ML RESVR - [id number] Laterality: N/A Area: Abdomen  Manufacturer: MEDTRONIC NEUROMOD PAIN MGMT Date of Manufacture:    Action: Implanted Number Used: 1   Device Identifier:  Device Identifier Type:     DEVICE Lucillie Garfinkel [id number]  Inventory Item: DEVICE IMPLANT NEUROSTIMINATOR Serial no.: [id number] Model/Cat no.: [id number]  Implant name: DEVICE Lucillie Garfinkel [id number] H Laterality: Right Area: Back   Manufacturer: MEDTRONIC NEUROMOD PAIN MGMT Date of Manufacture:    Action: Implanted Number Used: 1   Device Identifier:  Device Identifier TypeStarr Sinclair LRG - [id number]  Inventory Item: Regis Bill NEURO LRG Serial no.:  Model/Cat no.: [id number]  Implant name: Starr Sinclair LRG - [id number] Laterality: Right Area: Back   Manufacturer: MEDTRONIC NEUROMOD PAIN MGMT Date of Manufacture:    Action: Implanted Number Used: 1   Device Identifier:  Device Identifier Type:        I performed the case in its entirety with the assistance of Cooper Render, [place].   Deetta Perla, [place]
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
## 2021-02-11 ENCOUNTER — Encounter: Payer: Self-pay | Admitting: Neurosurgery

## 2021-02-11 NOTE — Anesthesia Postprocedure Evaluation (Signed)
Anesthesia Post Note  Patient: MAKAVELI HOARD  Procedure(s) Performed: INTRATHECAL PUMP IMPLANT SPINAL CORD STIMULATOR BATTERY EXCHANGE  Patient location during evaluation: PACU Anesthesia Type: General Level of consciousness: awake and alert Pain management: pain level controlled Vital Signs Assessment: post-procedure vital signs reviewed and stable Respiratory status: spontaneous breathing, nonlabored ventilation, respiratory function stable and patient connected to nasal cannula oxygen Cardiovascular status: blood pressure returned to baseline and stable Postop Assessment: no apparent nausea or vomiting Anesthetic complications: no   No notable events documented.   Last Vitals:  Vitals:   02/10/21 1528 02/10/21 1548  BP: (!) 142/79 (!) 146/80  Pulse: (!) 56 64  Resp: 16 18  Temp: (!) 36.3 C   SpO2: 100% 100%    Last Pain:  Vitals:   02/10/21 1548  TempSrc:   PainSc: 2                  Martha Clan

## 2021-02-28 ENCOUNTER — Other Ambulatory Visit: Payer: Self-pay | Admitting: Neurosurgery

## 2021-03-08 ENCOUNTER — Other Ambulatory Visit: Payer: Self-pay | Admitting: Physician Assistant

## 2021-03-08 DIAGNOSIS — G4719 Other hypersomnia: Secondary | ICD-10-CM

## 2021-03-10 ENCOUNTER — Telehealth: Payer: Self-pay

## 2021-03-10 NOTE — Telephone Encounter (Signed)
Pt aware we send med

## 2021-03-11 ENCOUNTER — Other Ambulatory Visit: Payer: Self-pay | Admitting: Physician Assistant

## 2021-03-11 DIAGNOSIS — F5101 Primary insomnia: Secondary | ICD-10-CM

## 2021-03-11 DIAGNOSIS — F411 Generalized anxiety disorder: Secondary | ICD-10-CM

## 2021-03-11 NOTE — Telephone Encounter (Signed)
Pt called for refill, message sent to Lauren

## 2021-03-11 NOTE — Telephone Encounter (Signed)
Please refill this med

## 2021-03-21 ENCOUNTER — Encounter (HOSPITAL_COMMUNITY): Payer: Self-pay | Admitting: Urgent Care

## 2021-03-24 ENCOUNTER — Inpatient Hospital Stay: Admission: RE | Admit: 2021-03-24 | Payer: 59 | Source: Ambulatory Visit

## 2021-04-07 ENCOUNTER — Ambulatory Visit: Admit: 2021-04-07 | Payer: 59 | Admitting: Neurosurgery

## 2021-04-07 ENCOUNTER — Telehealth: Payer: Self-pay

## 2021-04-07 ENCOUNTER — Other Ambulatory Visit: Payer: Self-pay | Admitting: Physician Assistant

## 2021-04-07 DIAGNOSIS — F5101 Primary insomnia: Secondary | ICD-10-CM

## 2021-04-07 DIAGNOSIS — G4719 Other hypersomnia: Secondary | ICD-10-CM

## 2021-04-07 SURGERY — THORACIC LAMINECTOMY FOR SPINAL CORD STIMULATOR
Anesthesia: General

## 2021-04-07 MED ORDER — ZOLPIDEM TARTRATE 10 MG PO TABS: 10.0000 mg | ORAL_TABLET | Freq: Every day | ORAL | 0 refills | Status: DC

## 2021-04-07 MED ORDER — LISDEXAMFETAMINE DIMESYLATE 40 MG PO CAPS: 40.0000 mg | ORAL_CAPSULE | Freq: Every morning | ORAL | 0 refills | Status: DC

## 2021-04-07 NOTE — Telephone Encounter (Signed)
Spoke to pt, he is aware of refills

## 2021-04-08 ENCOUNTER — Telehealth: Payer: Self-pay

## 2021-04-08 NOTE — Telephone Encounter (Signed)
PA sent for VYVANSAE 40 mg capsules 04/08/21 @ 525 pm

## 2021-04-29 ENCOUNTER — Telehealth: Payer: Self-pay

## 2021-04-29 NOTE — Telephone Encounter (Signed)
PA sent for MOVANTIK 25 mg 04/29/21 @ 1124 am

## 2021-05-01 ENCOUNTER — Telehealth: Payer: Self-pay

## 2021-05-01 NOTE — Telephone Encounter (Signed)
PA for MOVANTIK 25 mg was denied

## 2021-05-06 ENCOUNTER — Other Ambulatory Visit: Payer: Self-pay

## 2021-05-06 ENCOUNTER — Emergency Department: Payer: Managed Care, Other (non HMO)

## 2021-05-06 ENCOUNTER — Emergency Department
Admission: EM | Admit: 2021-05-06 | Discharge: 2021-05-07 | Disposition: A | Discharge status: Home / Self Care | Payer: Managed Care, Other (non HMO) | Attending: Emergency Medicine | Admitting: Emergency Medicine

## 2021-05-06 ENCOUNTER — Encounter: Payer: Self-pay | Admitting: Emergency Medicine

## 2021-05-06 DIAGNOSIS — S97112A Crushing injury of left great toe, initial encounter: Secondary | ICD-10-CM | POA: Insufficient documentation

## 2021-05-06 DIAGNOSIS — W230XXA Caught, crushed, jammed, or pinched between moving objects, initial encounter: Secondary | ICD-10-CM | POA: Insufficient documentation

## 2021-05-06 DIAGNOSIS — I10 Essential (primary) hypertension: Secondary | ICD-10-CM | POA: Diagnosis not present

## 2021-05-06 DIAGNOSIS — J449 Chronic obstructive pulmonary disease, unspecified: Secondary | ICD-10-CM | POA: Insufficient documentation

## 2021-05-06 DIAGNOSIS — I2510 Atherosclerotic heart disease of native coronary artery without angina pectoris: Secondary | ICD-10-CM | POA: Insufficient documentation

## 2021-05-06 DIAGNOSIS — S91209A Unspecified open wound of unspecified toe(s) with damage to nail, initial encounter: Secondary | ICD-10-CM

## 2021-05-06 DIAGNOSIS — S97102A Crushing injury of unspecified left toe(s), initial encounter: Secondary | ICD-10-CM

## 2021-05-06 NOTE — ED Triage Notes (Signed)
Pt to ED from work c/o left great toe injury.  Works for [employer].  States a hydraulic dock came down on toe.  No other toes evidently involved and no other pain per patient.

## 2021-05-07 MED ORDER — CEPHALEXIN 500 MG PO CAPS: 500.0000 mg | ORAL_CAPSULE | Freq: Four times a day (QID) | ORAL | 0 refills | Status: DC

## 2021-05-07 MED ORDER — OXYCODONE-ACETAMINOPHEN 5-325 MG PO TABS: 1.0000 | ORAL_TABLET | ORAL | 0 refills | Status: DC | PRN

## 2021-05-07 MED ORDER — CEPHALEXIN 500 MG PO CAPS
500.0000 mg | ORAL_CAPSULE | Freq: Once | ORAL | Status: AC
  Administered 2021-05-07: 500 mg via ORAL
  Filled 2021-05-07: qty 1

## 2021-05-07 MED ORDER — LIDOCAINE HCL (PF) 1 % IJ SOLN
10.0000 mL | Freq: Once | INTRAMUSCULAR | Status: AC
  Administered 2021-05-07: 10 mL
  Filled 2021-05-07: qty 10

## 2021-05-07 MED ORDER — KETOROLAC TROMETHAMINE 30 MG/ML IJ SOLN
30.0000 mg | Freq: Once | INTRAMUSCULAR | Status: AC
  Administered 2021-05-07: 30 mg via INTRAMUSCULAR
  Filled 2021-05-07: qty 1

## 2021-05-07 NOTE — ED Notes (Addendum)
Foot wrapped in xeroform gauze and dressed into post op shoe. Pt education provided to pt about foot care.

## 2021-05-07 NOTE — ED Provider Notes (Signed)
[facility] Provider Note    Event Date/Time   First MD Initiated Contact with Patient 05/06/21 2338     (approximate)   History   Chief Complaint Toe Injury   HPI  Charles [name] is a 61 y.o. male with past medical history of hypertension, hyperlipidemia, CAD, and COPD who presents to the ED complaining of toe injury.  Patient reports that about an hour prior to arrival he was operating a hydraulic lift when it came down on his left great toe.  He had immediate onset of pain in the toe and has had severe sharp pain when attempting to bear weight on the left foot.  He noticed significant bleeding from the toe, is not sure if his nail has remained in place.  He states his tetanus was updated a couple of months ago.  He has not take anything for pain prior to arrival.     Physical Exam   Triage Vital Signs: ED Triage Vitals  Enc Vitals Group     BP 05/06/21 2142 (!) 158/101     Pulse Rate 05/06/21 2142 68     Resp 05/06/21 2142 14     Temp 05/06/21 2142 98.4 F (36.9 C)     Temp Source 05/06/21 2142 Oral     SpO2 05/06/21 2142 98 %     Weight 05/06/21 2143 225 lb (102.1 kg)     Height 05/06/21 2143 6\' 1"  (1.854 m)     Head Circumference --      Peak Flow --      Pain Score 05/06/21 2143 9     Pain Loc --      Pain Edu? --      Excl. in [place]? --     Most recent vital signs: Vitals:   05/06/21 2142 05/06/21 2348  BP: (!) 158/101 137/77  Pulse: 68 63  Resp: 14 20  Temp: 98.4 F (36.9 C)   SpO2: 98% 98%    Constitutional: Alert and oriented. Eyes: Conjunctivae are normal. Head: Atraumatic. Nose: No congestion/rhinnorhea. Mouth/Throat: Mucous membranes are moist. Cardiovascular: Normal rate, regular rhythm. Grossly normal heart sounds.  2+ radial and DP pulses bilaterally. Respiratory: Normal respiratory effort.  No retractions. Lungs CTAB. Gastrointestinal: Soft and nontender. No distention. Musculoskeletal: Crush injury to left great  toe with complete avulsion of nail.  Laceration noted extending down the medial portion of left toe, approximately 2 cm.  Minimal active bleeding noted.  No tenderness to palpation of other digits of left foot or over the dorsum of left foot. Neurologic:  Normal speech and language. No gross focal neurologic deficits are appreciated.    ED Results / Procedures / Treatments   Labs (all labs ordered are listed, but only abnormal results are displayed) Labs Reviewed - No data to display   RADIOLOGY Foot x-ray reviewed by me with transverse fracture of distal phalanx of left great toe.  PROCEDURES:  Critical Care performed: No  ..Laceration Repair  Date/Time: 05/07/2021 1:09 AM Performed by: Blake Divine, MD Authorized by: Blake Divine, MD   Consent:    Consent obtained:  Verbal   Consent given by:  Patient   Risks, benefits, and alternatives were discussed: yes   Universal protocol:    Patient identity confirmed:  Verbally with patient and arm band Anesthesia:    Anesthesia method:  Nerve block   Block needle gauge:  27 G   Block anesthetic:  Lidocaine 1% w/o epi  Block technique:  Digital block   Block injection procedure:  Anatomic landmarks identified, introduced needle, incremental injection, negative aspiration for blood and anatomic landmarks palpated   Block outcome:  Anesthesia achieved Laceration details:    Location:  Toe   Toe location:  L big toe   Length (cm):  2 Pre-procedure details:    Preparation:  Patient was prepped and draped in usual sterile fashion and imaging obtained to evaluate for foreign bodies Exploration:    Limited defect created (wound extended): no     Hemostasis achieved with:  Direct pressure   Imaging obtained: x-ray     Imaging outcome: foreign body not noted     Wound exploration: wound explored through full range of motion and entire depth of wound visualized     Contaminated: no   Treatment:    Area cleansed with:  Saline    Amount of cleaning:  Extensive   Irrigation solution:  Sterile saline   Irrigation method:  Pressure wash   Visualized foreign bodies/material removed: no     Debridement:  None   Undermining:  None   Scar revision: no   Skin repair:    Repair method:  Sutures   Suture size:  3-0   Suture material:  Nylon   Suture technique:  Simple interrupted   Number of sutures:  2 Approximation:    Approximation:  Loose Repair type:    Repair type:  Simple Post-procedure details:    Dressing:  Non-adherent dressing   Procedure completion:  Tolerated well, no immediate complications   MEDICATIONS ORDERED IN ED: Medications  cephALEXin (KEFLEX) capsule 500 mg (has no administration in time range)  ketorolac (TORADOL) 30 MG/ML injection 30 mg (has no administration in time range)  lidocaine (PF) (XYLOCAINE) 1 % injection 10 mL (10 mLs Infiltration Given by Other 05/07/21 0044)     IMPRESSION / MDM / [place] / ED COURSE  I reviewed the triage vital signs and the nursing notes.                              61 y.o. male with past medical history of hypertension, hyperlipidemia, CAD, and COPD who presents to the ED after a hydraulic lift came down on his left great toe.  Differential diagnosis includes, but is not limited to, fracture, dislocation, laceration, crush injury, nail avulsion.  Patient well-appearing and in no acute distress, has significant crush injury to his left toe but is neurovascularly intact.  X-rays do show transverse fracture of the distal phalanx and we will treat as open fracture with antibiotics.  Much of the injury is not able to be repaired with sutures given significant maceration, however laceration extending down the medial portion of the toe was repaired with 2 sutures with loose approximation following digital block.  Case discussed with Dr. Vickki Muff of podiatry, who agrees with plan for dressing and postop shoe along with course of Keflex.  We able to  follow-up with his podiatrist, Dr. Luana Shu, later this week or early next week.  Patient was counseled to return to the ED for new or worsening symptoms, will be prescribed short course of pain medication to go with antibiotics.  Patient agrees with plan.       FINAL CLINICAL IMPRESSION(S) / ED DIAGNOSES   Final diagnoses:  Crushing injury of toe of left foot, initial encounter  Avulsion of toenail, initial encounter  Rx / DC Orders   ED Discharge Orders          Ordered    oxyCODONE-acetaminophen (PERCOCET) 5-325 MG tablet  Every 4 hours PRN,   Status:  Discontinued        05/07/21 0124    cephALEXin (KEFLEX) 500 MG capsule  4 times daily,   Status:  Discontinued        05/07/21 0124    cephALEXin (KEFLEX) 500 MG capsule  4 times daily        05/07/21 0126    oxyCODONE-acetaminophen (PERCOCET) 5-325 MG tablet  Every 4 hours PRN        05/07/21 0126             Note:  This document was prepared using Dragon voice recognition software and may include unintentional dictation errors.   Blake Divine, MD 05/07/21 [id number]

## 2021-05-08 ENCOUNTER — Telehealth: Payer: Self-pay

## 2021-05-08 ENCOUNTER — Other Ambulatory Visit: Payer: Self-pay

## 2021-05-08 ENCOUNTER — Encounter: Payer: Self-pay | Admitting: Physician Assistant

## 2021-05-08 ENCOUNTER — Ambulatory Visit (INDEPENDENT_AMBULATORY_CARE_PROVIDER_SITE_OTHER): Payer: Managed Care, Other (non HMO) | Admitting: Physician Assistant

## 2021-05-08 VITALS — BP 122/78 | HR 90 | Temp 98.4°F | Resp 16 | Ht 73.0 in | Wt 236.6 lb

## 2021-05-08 DIAGNOSIS — G4719 Other hypersomnia: Secondary | ICD-10-CM | POA: Diagnosis not present

## 2021-05-08 DIAGNOSIS — R5383 Other fatigue: Secondary | ICD-10-CM

## 2021-05-08 DIAGNOSIS — L97519 Non-pressure chronic ulcer of other part of right foot with unspecified severity: Secondary | ICD-10-CM | POA: Diagnosis not present

## 2021-05-08 DIAGNOSIS — Z1211 Encounter for screening for malignant neoplasm of colon: Secondary | ICD-10-CM

## 2021-05-08 DIAGNOSIS — R7301 Impaired fasting glucose: Secondary | ICD-10-CM

## 2021-05-08 DIAGNOSIS — G894 Chronic pain syndrome: Secondary | ICD-10-CM

## 2021-05-08 DIAGNOSIS — Z1212 Encounter for screening for malignant neoplasm of rectum: Secondary | ICD-10-CM

## 2021-05-08 DIAGNOSIS — Z125 Encounter for screening for malignant neoplasm of prostate: Secondary | ICD-10-CM

## 2021-05-08 DIAGNOSIS — I10 Essential (primary) hypertension: Secondary | ICD-10-CM | POA: Diagnosis not present

## 2021-05-08 DIAGNOSIS — F5101 Primary insomnia: Secondary | ICD-10-CM

## 2021-05-08 DIAGNOSIS — E782 Mixed hyperlipidemia: Secondary | ICD-10-CM

## 2021-05-08 MED ORDER — ZOLPIDEM TARTRATE 10 MG PO TABS: 10.0000 mg | ORAL_TABLET | Freq: Every day | ORAL | 0 refills | Status: DC

## 2021-05-08 MED ORDER — ATORVASTATIN CALCIUM 40 MG PO TABS: ORAL_TABLET | ORAL | 1 refills | Status: DC

## 2021-05-08 MED ORDER — LISDEXAMFETAMINE DIMESYLATE 40 MG PO CAPS: 40.0000 mg | ORAL_CAPSULE | Freq: Every morning | ORAL | 0 refills | Status: DC

## 2021-05-08 MED ORDER — NA SULFATE-K SULFATE-MG SULF 17.5-3.13-1.6 GM/177ML PO SOLN: 1.0000 | Freq: Once | ORAL | 0 refills | Status: AC

## 2021-05-08 MED ORDER — AMLODIPINE BESYLATE 5 MG PO TABS: 5.0000 mg | ORAL_TABLET | Freq: Every day | ORAL | 1 refills | Status: DC

## 2021-05-08 MED ORDER — CARVEDILOL 12.5 MG PO TABS: 12.5000 mg | ORAL_TABLET | Freq: Two times a day (BID) | ORAL | 1 refills | Status: DC
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
## 2021-05-08 NOTE — Telephone Encounter (Signed)
error 

## 2021-05-08 NOTE — Progress Notes (Signed)
Gastroenterology Pre-Procedure Review  Request Date: 07/07/2021 Requesting Physician: Dr. Marius Ditch   PATIENT REVIEW QUESTIONS: The patient responded to the following health history questions as indicated:    1. Are you having any GI issues? no 2. Do you have a personal history of Polyps? yes (last colonoscopy) 3. Do you have a family history of Colon Cancer or Polyps? no 4. Diabetes Mellitus? no 5. Joint replacements in the past 12 months?no 6. Major health problems in the past 3 months?broke toe  7. Any artificial heart valves, MVP, or defibrillator?yes (stint in heart)    MEDICATIONS & ALLERGIES:    Patient reports the following regarding taking any anticoagulation/antiplatelet therapy:   Plavix, Coumadin, Eliquis, Xarelto, Lovenox, Pradaxa, Brilinta, or Effient? no Aspirin? yes (81 mg)  Patient confirms/reports the following medications:  Current Outpatient Medications  Medication Sig Dispense Refill   albuterol (PROVENTIL HFA;VENTOLIN HFA) 108 (90 Base) MCG/ACT inhaler Inhale 2 puffs into the lungs every 6 (six) hours as needed for wheezing or shortness of breath. 1 Inhaler 5   amLODipine (NORVASC) 5 MG tablet Take 1 tablet (5 mg total) by mouth daily. 90 tablet 1   atorvastatin (LIPITOR) 40 MG tablet Take one tab at bed time for cholesterol 90 tablet 1   Biotin 5 MG CAPS Take 5 mg by mouth daily.     Black Elderberry (SAMBUCUS ELDERBERRY PO) Take 2 tablets by mouth daily.     buPROPion (WELLBUTRIN XL) 150 MG 24 hr tablet Take 1 tablet (150 mg total) by mouth daily. 90 tablet 1   busPIRone (BUSPAR) 30 MG tablet TAKE ONE TABLET BY MOUTH 3 TIMES DAILY AS NEEDED 90 tablet 2   carvedilol (COREG) 12.5 MG tablet Take 1 tablet (12.5 mg total) by mouth 2 (two) times daily with a meal. 180 tablet 1   cephALEXin (KEFLEX) 500 MG capsule Take 1 capsule (500 mg total) by mouth 4 (four) times daily for 7 days. 28 capsule 0   Cholecalciferol (VITAMIN D) 125 MCG (5000 UT) CAPS Take 5,000 Units by  mouth daily.     Coenzyme Q10 (CO Q-10) 200 MG CAPS Take 200 mg by mouth daily.     Cyanocobalamin (B-12 PO) Take 1,000 mcg by mouth daily.      DULoxetine (CYMBALTA) 60 MG capsule TAKE 1 CAPSULE BY MOUTH DAILY. 30 capsule 2   ferrous sulfate 325 (65 FE) MG tablet Take 1 tablet (325 mg total) by mouth daily with breakfast. 30 tablet 1   levothyroxine (SYNTHROID) 75 MCG tablet Take 1 tablet (75 mcg total) by mouth daily before breakfast. 90 tablet 3   lisdexamfetamine (VYVANSE) 40 MG capsule Take 1 capsule (40 mg total) by mouth every morning. 30 capsule 0   methocarbamol (ROBAXIN) 500 MG tablet Take 1 tablet (500 mg total) by mouth every 8 (eight) hours as needed for muscle spasms. 60 tablet 0   Multiple Vitamin (MULTIVITAMIN WITH MINERALS) TABS tablet Take 1 tablet by mouth daily.     naloxegol oxalate (MOVANTIK) 25 MG TABS tablet Take 1 tablet (25 mg total) by mouth daily. (Patient taking differently: Take 25 mg by mouth daily as needed.) 30 tablet 3   ondansetron (ZOFRAN) 8 MG tablet TAKE 1 TABLET BY MOUTH 3 TIMES DAILY AS NEEDED FOR NAUSEA 90 tablet 2   Oxycodone HCl 10 MG TABS Take 10 mg by mouth every 4 (four) hours as needed.     oxyCODONE-acetaminophen (PERCOCET) 5-325 MG tablet Take 1 tablet by mouth every 4 (four) hours  as needed for severe pain. 12 tablet 0   polycarbophil (FIBERCON) 625 MG tablet Take 625 mg by mouth daily.     polyethylene glycol (MIRALAX / GLYCOLAX) 17 g packet Take 17 g by mouth daily as needed for mild constipation. 14 each 0   sildenafil (REVATIO) 20 MG tablet Take 1 tablet (20 mg total) by mouth daily as needed (ED). 10 tablet 3   tamsulosin (FLOMAX) 0.4 MG CAPS capsule Take 1 capsule (0.4 mg total) by mouth daily. 90 capsule 3   TURMERIC PO Take 1 capsule by mouth daily.     vitamin C (ASCORBIC ACID) 500 MG tablet Take 500 mg by mouth daily.     zolpidem (AMBIEN) 10 MG tablet Take 1 tablet (10 mg total) by mouth at bedtime. 30 tablet 0   No current  facility-administered medications for this visit.    Patient confirms/reports the following allergies:  Allergies  Allergen Reactions   Lisinopril Cough    No orders of the defined types were placed in this encounter.   AUTHORIZATION INFORMATION Primary Insurance: 1D#: Group #:  Secondary Insurance: 1D#: Group #:  SCHEDULE INFORMATION: Date: 07/07/2021 Time: Location:[facility]

## 2021-05-09 ENCOUNTER — Other Ambulatory Visit: Payer: Self-pay | Admitting: Podiatry

## 2021-05-12 ENCOUNTER — Encounter: Payer: Self-pay | Admitting: Anesthesiology

## 2021-05-12 ENCOUNTER — Telehealth: Payer: Self-pay

## 2021-05-12 ENCOUNTER — Encounter: Payer: Self-pay | Admitting: Podiatry

## 2021-05-12 NOTE — Telephone Encounter (Signed)
Faxed surgical clearance form with supporting office notes and most recent EKG to [phone]. Surgical clearance placed in scan.

## 2021-05-13 ENCOUNTER — Emergency Department: Payer: Managed Care, Other (non HMO)

## 2021-05-13 ENCOUNTER — Inpatient Hospital Stay
Admission: EM | Admit: 2021-05-13 | Discharge: 2021-05-15 | DRG: 464 | Disposition: A | Discharge status: Home / Self Care | Payer: Managed Care, Other (non HMO) | Attending: Internal Medicine | Admitting: Internal Medicine

## 2021-05-13 ENCOUNTER — Other Ambulatory Visit: Payer: Self-pay

## 2021-05-13 DIAGNOSIS — M109 Gout, unspecified: Secondary | ICD-10-CM | POA: Diagnosis present

## 2021-05-13 DIAGNOSIS — Z955 Presence of coronary angioplasty implant and graft: Secondary | ICD-10-CM

## 2021-05-13 DIAGNOSIS — L03116 Cellulitis of left lower limb: Secondary | ICD-10-CM

## 2021-05-13 DIAGNOSIS — S92405A Nondisplaced unspecified fracture of left great toe, initial encounter for closed fracture: Secondary | ICD-10-CM | POA: Diagnosis not present

## 2021-05-13 DIAGNOSIS — Z6830 Body mass index (BMI) 30.0-30.9, adult: Secondary | ICD-10-CM

## 2021-05-13 DIAGNOSIS — Z8711 Personal history of peptic ulcer disease: Secondary | ICD-10-CM | POA: Diagnosis not present

## 2021-05-13 DIAGNOSIS — I10 Essential (primary) hypertension: Secondary | ICD-10-CM | POA: Diagnosis present

## 2021-05-13 DIAGNOSIS — E785 Hyperlipidemia, unspecified: Secondary | ICD-10-CM | POA: Diagnosis present

## 2021-05-13 DIAGNOSIS — L089 Local infection of the skin and subcutaneous tissue, unspecified: Secondary | ICD-10-CM | POA: Diagnosis not present

## 2021-05-13 DIAGNOSIS — Z8616 Personal history of COVID-19: Secondary | ICD-10-CM

## 2021-05-13 DIAGNOSIS — G894 Chronic pain syndrome: Secondary | ICD-10-CM | POA: Diagnosis present

## 2021-05-13 DIAGNOSIS — E669 Obesity, unspecified: Secondary | ICD-10-CM | POA: Diagnosis present

## 2021-05-13 DIAGNOSIS — F909 Attention-deficit hyperactivity disorder, unspecified type: Secondary | ICD-10-CM | POA: Diagnosis present

## 2021-05-13 DIAGNOSIS — L97419 Non-pressure chronic ulcer of right heel and midfoot with unspecified severity: Secondary | ICD-10-CM | POA: Diagnosis present

## 2021-05-13 DIAGNOSIS — Z91199 Patient's noncompliance with other medical treatment and regimen due to unspecified reason: Secondary | ICD-10-CM

## 2021-05-13 DIAGNOSIS — Z8719 Personal history of other diseases of the digestive system: Secondary | ICD-10-CM | POA: Diagnosis not present

## 2021-05-13 DIAGNOSIS — S99929A Unspecified injury of unspecified foot, initial encounter: Secondary | ICD-10-CM | POA: Diagnosis not present

## 2021-05-13 DIAGNOSIS — Z888 Allergy status to other drugs, medicaments and biological substances status: Secondary | ICD-10-CM

## 2021-05-13 DIAGNOSIS — F419 Anxiety disorder, unspecified: Secondary | ICD-10-CM | POA: Diagnosis present

## 2021-05-13 DIAGNOSIS — E039 Hypothyroidism, unspecified: Secondary | ICD-10-CM | POA: Diagnosis present

## 2021-05-13 DIAGNOSIS — I252 Old myocardial infarction: Secondary | ICD-10-CM

## 2021-05-13 DIAGNOSIS — E1169 Type 2 diabetes mellitus with other specified complication: Secondary | ICD-10-CM | POA: Diagnosis present

## 2021-05-13 DIAGNOSIS — S92402G Displaced unspecified fracture of left great toe, subsequent encounter for fracture with delayed healing: Secondary | ICD-10-CM

## 2021-05-13 DIAGNOSIS — F32A Depression, unspecified: Secondary | ICD-10-CM | POA: Diagnosis present

## 2021-05-13 DIAGNOSIS — Z79890 Hormone replacement therapy (postmenopausal): Secondary | ICD-10-CM

## 2021-05-13 DIAGNOSIS — L03032 Cellulitis of left toe: Secondary | ICD-10-CM | POA: Diagnosis not present

## 2021-05-13 DIAGNOSIS — Z89421 Acquired absence of other right toe(s): Secondary | ICD-10-CM

## 2021-05-13 DIAGNOSIS — L98499 Non-pressure chronic ulcer of skin of other sites with unspecified severity: Secondary | ICD-10-CM | POA: Diagnosis not present

## 2021-05-13 DIAGNOSIS — M869 Osteomyelitis, unspecified: Secondary | ICD-10-CM | POA: Diagnosis not present

## 2021-05-13 DIAGNOSIS — M868X7 Other osteomyelitis, ankle and foot: Secondary | ICD-10-CM | POA: Diagnosis not present

## 2021-05-13 DIAGNOSIS — Z9682 Presence of neurostimulator: Secondary | ICD-10-CM

## 2021-05-13 DIAGNOSIS — Y990 Civilian activity done for income or pay: Secondary | ICD-10-CM

## 2021-05-13 DIAGNOSIS — Z20822 Contact with and (suspected) exposure to covid-19: Secondary | ICD-10-CM | POA: Diagnosis present

## 2021-05-13 DIAGNOSIS — J45909 Unspecified asthma, uncomplicated: Secondary | ICD-10-CM | POA: Diagnosis present

## 2021-05-13 DIAGNOSIS — N400 Enlarged prostate without lower urinary tract symptoms: Secondary | ICD-10-CM | POA: Diagnosis present

## 2021-05-13 DIAGNOSIS — Z833 Family history of diabetes mellitus: Secondary | ICD-10-CM

## 2021-05-13 DIAGNOSIS — S97112D Crushing injury of left great toe, subsequent encounter: Secondary | ICD-10-CM

## 2021-05-13 DIAGNOSIS — Z9884 Bariatric surgery status: Secondary | ICD-10-CM

## 2021-05-13 DIAGNOSIS — Z79899 Other long term (current) drug therapy: Secondary | ICD-10-CM

## 2021-05-13 DIAGNOSIS — I2510 Atherosclerotic heart disease of native coronary artery without angina pectoris: Secondary | ICD-10-CM | POA: Diagnosis present

## 2021-05-13 DIAGNOSIS — L97529 Non-pressure chronic ulcer of other part of left foot with unspecified severity: Secondary | ICD-10-CM | POA: Diagnosis present

## 2021-05-13 DIAGNOSIS — K219 Gastro-esophageal reflux disease without esophagitis: Secondary | ICD-10-CM | POA: Diagnosis present

## 2021-05-13 DIAGNOSIS — M549 Dorsalgia, unspecified: Secondary | ICD-10-CM | POA: Diagnosis present

## 2021-05-13 DIAGNOSIS — Z09 Encounter for follow-up examination after completed treatment for conditions other than malignant neoplasm: Secondary | ICD-10-CM

## 2021-05-13 DIAGNOSIS — D649 Anemia, unspecified: Secondary | ICD-10-CM | POA: Diagnosis not present

## 2021-05-13 DIAGNOSIS — G629 Polyneuropathy, unspecified: Secondary | ICD-10-CM | POA: Diagnosis present

## 2021-05-13 LAB — CBC WITH DIFFERENTIAL/PLATELET
Abs Immature Granulocytes: 0.03 10*3/uL (ref 0.00–0.07)
Basophils Absolute: 0 10*3/uL (ref 0.0–0.1)
Basophils Relative: 0 %
Eosinophils Absolute: 0.3 10*3/uL (ref 0.0–0.5)
Eosinophils Relative: 3 %
HCT: 36.3 % — ABNORMAL LOW (ref 39.0–52.0)
Hemoglobin: 11.5 g/dL — ABNORMAL LOW (ref 13.0–17.0)
Immature Granulocytes: 0 %
Lymphocytes Relative: 16 %
Lymphs Abs: 1.6 10*3/uL (ref 0.7–4.0)
MCH: 28 pg (ref 26.0–34.0)
MCHC: 31.7 g/dL (ref 30.0–36.0)
MCV: 88.3 fL (ref 80.0–100.0)
Monocytes Absolute: 0.8 10*3/uL (ref 0.1–1.0)
Monocytes Relative: 8 %
Neutro Abs: 7.2 10*3/uL (ref 1.7–7.7)
Neutrophils Relative %: 73 %
Platelets: 369 10*3/uL (ref 150–400)
RBC: 4.11 MIL/uL — ABNORMAL LOW (ref 4.22–5.81)
RDW: 13.6 % (ref 11.5–15.5)
WBC: 9.9 10*3/uL (ref 4.0–10.5)
nRBC: 0 % (ref 0.0–0.2)

## 2021-05-13 LAB — COMPREHENSIVE METABOLIC PANEL
ALT: 24 U/L (ref 0–44)
AST: 22 U/L (ref 15–41)
Albumin: 3.4 g/dL — ABNORMAL LOW (ref 3.5–5.0)
Alkaline Phosphatase: 109 U/L (ref 38–126)
Anion gap: 7 (ref 5–15)
BUN: 26 mg/dL — ABNORMAL HIGH (ref 6–20)
CO2: 24 mmol/L (ref 22–32)
Calcium: 8.7 mg/dL — ABNORMAL LOW (ref 8.9–10.3)
Chloride: 102 mmol/L (ref 98–111)
Creatinine, Ser: 1.06 mg/dL (ref 0.61–1.24)
GFR, Estimated: >60 mL/min (ref >60)
Glucose, Bld: 116 mg/dL — ABNORMAL HIGH (ref 70–99)
Potassium: 4.5 mmol/L (ref 3.5–5.1)
Sodium: 133 mmol/L — ABNORMAL LOW (ref 135–145)
Total Bilirubin: 0.3 mg/dL (ref 0.3–1.2)
Total Protein: 7.5 g/dL (ref 6.5–8.1)

## 2021-05-13 LAB — URINALYSIS, ROUTINE W REFLEX MICROSCOPIC
Bilirubin Urine: NEGATIVE
Glucose, UA: NEGATIVE mg/dL
Hgb urine dipstick: NEGATIVE
Ketones, ur: NEGATIVE mg/dL
Leukocytes,Ua: NEGATIVE
Nitrite: NEGATIVE
Protein, ur: NEGATIVE mg/dL
Specific Gravity, Urine: 1.02 (ref 1.005–1.030)
pH: 5 (ref 5.0–8.0)

## 2021-05-13 LAB — LACTIC ACID, PLASMA
Lactic Acid, Venous: 1 mmol/L (ref 0.5–1.9)
Lactic Acid, Venous: 1.4 mmol/L (ref 0.5–1.9)

## 2021-05-13 LAB — RESP PANEL BY RT-PCR (FLU A&B, COVID) ARPGX2
Influenza A by PCR: NEGATIVE
Influenza B by PCR: NEGATIVE
SARS Coronavirus 2 by RT PCR: NEGATIVE

## 2021-05-13 MED ORDER — IOHEXOL 300 MG/ML  SOLN
75.0000 mL | Freq: Once | INTRAMUSCULAR | Status: AC | PRN
  Administered 2021-05-13: 17:00:00 75 mL via INTRAVENOUS

## 2021-05-13 MED ORDER — ENOXAPARIN SODIUM 40 MG/0.4ML IJ SOSY
40.0000 mg | PREFILLED_SYRINGE | INTRAMUSCULAR | Status: DC
  Administered 2021-05-13 – 2021-05-14 (×2): 40 mg via SUBCUTANEOUS
  Filled 2021-05-13 (×2): qty 0.4

## 2021-05-13 MED ORDER — BUPROPION HCL ER (XL) 150 MG PO TB24
150.0000 mg | ORAL_TABLET | Freq: Every day | ORAL | Status: DC
  Administered 2021-05-14 – 2021-05-15 (×2): 150 mg via ORAL
  Filled 2021-05-13 (×2): qty 1

## 2021-05-13 MED ORDER — TAMSULOSIN HCL 0.4 MG PO CAPS
0.4000 mg | ORAL_CAPSULE | Freq: Every day | ORAL | Status: DC
  Administered 2021-05-14 – 2021-05-15 (×2): 0.4 mg via ORAL
  Filled 2021-05-13 (×2): qty 1

## 2021-05-13 MED ORDER — CARVEDILOL 12.5 MG PO TABS
12.5000 mg | ORAL_TABLET | Freq: Two times a day (BID) | ORAL | Status: DC
  Administered 2021-05-13 – 2021-05-15 (×4): 12.5 mg via ORAL
  Filled 2021-05-13 (×4): qty 1

## 2021-05-13 MED ORDER — PIPERACILLIN-TAZOBACTAM 3.375 G IVPB
3.3750 g | Freq: Three times a day (TID) | INTRAVENOUS | Status: DC
  Administered 2021-05-13 – 2021-05-14 (×2): 3.375 g via INTRAVENOUS
  Filled 2021-05-13 (×2): qty 50

## 2021-05-13 MED ORDER — DULOXETINE HCL 60 MG PO CPEP
60.0000 mg | ORAL_CAPSULE | Freq: Every day | ORAL | Status: DC
  Administered 2021-05-14 – 2021-05-15 (×2): 60 mg via ORAL
  Filled 2021-05-13 (×2): qty 1

## 2021-05-13 MED ORDER — MUPIROCIN 2 % EX OINT
1.0000 "application " | TOPICAL_OINTMENT | Freq: Two times a day (BID) | CUTANEOUS | Status: DC
  Administered 2021-05-13 – 2021-05-15 (×3): 1 via NASAL
  Filled 2021-05-13: qty 22

## 2021-05-13 MED ORDER — VANCOMYCIN HCL IN DEXTROSE 1-5 GM/200ML-% IV SOLN
1000.0000 mg | Freq: Once | INTRAVENOUS | Status: AC
  Administered 2021-05-13: 22:00:00 1000 mg via INTRAVENOUS
  Filled 2021-05-13: qty 200

## 2021-05-13 MED ORDER — LEVOTHYROXINE SODIUM 50 MCG PO TABS
75.0000 ug | ORAL_TABLET | Freq: Every day | ORAL | Status: DC
  Administered 2021-05-14 – 2021-05-15 (×2): 75 ug via ORAL
  Filled 2021-05-13 (×2): qty 1

## 2021-05-13 MED ORDER — VANCOMYCIN HCL IN DEXTROSE 1-5 GM/200ML-% IV SOLN
1000.0000 mg | Freq: Once | INTRAVENOUS | Status: AC
  Administered 2021-05-13: 17:00:00 1000 mg via INTRAVENOUS
  Filled 2021-05-13: qty 200

## 2021-05-13 MED ORDER — OXYCODONE-ACETAMINOPHEN 5-325 MG PO TABS
1.0000 | ORAL_TABLET | ORAL | Status: DC | PRN
  Administered 2021-05-13 – 2021-05-15 (×8): 2 via ORAL
  Filled 2021-05-13 (×8): qty 2

## 2021-05-13 MED ORDER — VANCOMYCIN HCL 1250 MG/250ML IV SOLN
1250.0000 mg | Freq: Two times a day (BID) | INTRAVENOUS | Status: DC
  Administered 2021-05-14 – 2021-05-15 (×3): 1250 mg via INTRAVENOUS
  Filled 2021-05-13 (×4): qty 250

## 2021-05-13 MED ORDER — SODIUM CHLORIDE 0.9 % IV SOLN: INTRAVENOUS | Status: DC | PRN

## 2021-05-13 MED ORDER — MORPHINE SULFATE (PF) 4 MG/ML IV SOLN
4.0000 mg | Freq: Once | INTRAVENOUS | Status: AC
  Administered 2021-05-13: 17:00:00 4 mg via INTRAVENOUS
  Filled 2021-05-13: qty 1

## 2021-05-13 MED ORDER — PIPERACILLIN-TAZOBACTAM 3.375 G IVPB 30 MIN
3.3750 g | Freq: Once | INTRAVENOUS | Status: AC
  Administered 2021-05-13: 17:00:00 3.375 g via INTRAVENOUS
  Filled 2021-05-13: qty 50
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
## 2021-05-13 NOTE — ED Triage Notes (Signed)
Pt to ED via POV from home. Pt c/o left great toe injury after crushing it in a hydraulic dock. Pt stating pain and presentation of toe continues to get worse. Pt with toe amputation to right foot. Pt stating unable to get pain relief. Pt sees podiatrist and was referred to come to ER.

## 2021-05-13 NOTE — ED Provider Notes (Signed)
[facility] Provider Note    Event Date/Time   First MD Initiated Contact with Patient 05/13/21 1544     (approximate)   History   Toe Injury   HPI  Charles [name] is a 61 y.o. male with PMH of CAD, hypertension, hypothyroidism, chronic back pain, and status post crush injury to the left great toe on 1/18 presents referred from his podiatrist with worsening swelling.  The patient states that over the last several days the toe has become increasingly swollen and the top of the foot has become red.  He also has had increased swelling throughout his left lower leg.  He denies any fever or chills.  He sent a picture to his podiatrist Dr. Luana Shu today and was instructed to go to the ED for antibiotics.    Physical Exam   Triage Vital Signs: ED Triage Vitals  Enc Vitals Group     BP 05/13/21 1245 134/82     Pulse Rate 05/13/21 1245 75     Resp 05/13/21 1245 16     Temp 05/13/21 1245 98.1 F (36.7 C)     Temp Source 05/13/21 1245 Oral     SpO2 05/13/21 1245 96 %     Weight 05/13/21 1243 225 lb (102.1 kg)     Height 05/13/21 1243 6\' 1"  (1.854 m)     Head Circumference --      Peak Flow --      Pain Score 05/13/21 1242 10     Pain Loc --      Pain Edu? --      Excl. in [place]? --     Most recent vital signs: Vitals:   05/13/21 2013 05/13/21 2039  BP: 139/69 (!) 152/74  Pulse: 75 81  Resp: 16 18  Temp: 98.1 F (36.7 C) 98.5 F (36.9 C)  SpO2: 99% 98%     General: Awake, no distress.  CV:  Good peripheral perfusion.  Resp:  Normal effort.  Abd:  No distention.  Other:  Left lower extremity with 2+ pitting edema up to the knee.  Left great toe with open wound over nailbed, swollen and tender with no active drainage and no fluctuance.  Erythema, induration, and warmth extending up to the midfoot.   ED Results / Procedures / Treatments   Labs (all labs ordered are listed, but only abnormal results are displayed) Labs Reviewed  COMPREHENSIVE  METABOLIC PANEL - Abnormal; Notable for the following components:      Result Value   Sodium 133 (*)    Glucose, Bld 116 (*)    BUN 26 (*)    Calcium 8.7 (*)    Albumin 3.4 (*)    All other components within normal limits  CBC WITH DIFFERENTIAL/PLATELET - Abnormal; Notable for the following components:   RBC 4.11 (*)    Hemoglobin 11.5 (*)    HCT 36.3 (*)    All other components within normal limits  RESP PANEL BY RT-PCR (FLU A&B, COVID) ARPGX2  CULTURE, BLOOD (ROUTINE X 2)  CULTURE, BLOOD (ROUTINE X 2)  SURGICAL PCR SCREEN  LACTIC ACID, PLASMA  LACTIC ACID, PLASMA  URINALYSIS, ROUTINE W REFLEX MICROSCOPIC  BASIC METABOLIC PANEL  CBC  MAGNESIUM  HIV ANTIBODY (ROUTINE TESTING W REFLEX)  HEMOGLOBIN A1C     EKG     RADIOLOGY  XR L foot: Images interpreted by me show a displaced fracture with soft tissue swelling.  Radiology read is as follows:  IMPRESSION:  Displaced fracture of the distal tuft of the great toe distal  phalanx with increased displacement compared to the study from  05/06/2021. There is surrounding soft tissue swelling with lucency  that may reflect soft tissue gas. Infection with gas-forming  organism can not be excluded. There is no definite osseous erosion  or destruction, though osteomyelitis is not excluded.   US venous LLE: Images interpreted by me.  No evidence of acute DVT.  CT L foot: Pending   PROCEDURES:  Critical Care performed: No  Procedures   MEDICATIONS ORDERED IN ED: Medications  oxyCODONE-acetaminophen (PERCOCET/ROXICET) 5-325 MG per tablet 1-2 tablet (2 tablets Oral Given 05/13/21 2232)  carvedilol (COREG) tablet 12.5 mg (12.5 mg Oral Given 05/13/21 1806)  buPROPion (WELLBUTRIN XL) 24 hr tablet 150 mg (has no administration in time range)  DULoxetine (CYMBALTA) DR capsule 60 mg (has no administration in time range)  levothyroxine (SYNTHROID) tablet 75 mcg (has no administration in time range)  tamsulosin (FLOMAX) capsule  0.4 mg (has no administration in time range)  enoxaparin (LOVENOX) injection 40 mg (40 mg Subcutaneous Given 05/13/21 2141)  piperacillin-tazobactam (ZOSYN) IVPB 3.375 g (has no administration in time range)  vancomycin (VANCOREADY) IVPB 1250 mg/250 mL (has no administration in time range)  0.9 %  sodium chloride infusion ( Intravenous New Bag/Given 05/13/21 2139)  mupirocin ointment (BACTROBAN) 2 % 1 application (has no administration in time range)  vancomycin (VANCOCIN) IVPB 1000 mg/200 mL premix (0 mg Intravenous Stopped 05/13/21 1819)  piperacillin-tazobactam (ZOSYN) IVPB 3.375 g (0 g Intravenous Stopped 05/13/21 1657)  morphine 4 MG/ML injection 4 mg (4 mg Intravenous Given 05/13/21 1645)  iohexol (OMNIPAQUE) 300 MG/ML solution 75 mL (75 mLs Intravenous Contrast Given 05/13/21 1724)  vancomycin (VANCOCIN) IVPB 1000 mg/200 mL premix (1,000 mg Intravenous New Bag/Given 05/13/21 2141)     IMPRESSION / MDM / ASSESSMENT AND PLAN / ED COURSE  I reviewed the triage vital signs and the nursing notes.  61 year old male with PMH as noted above presents with increased left great toe and foot swelling and pain as well as increased swelling to the left lower leg over the last 6 days since a crush injury to the left great toe.  The patient previously required a toe amputation due to a nonhealing wound, although he is not diabetic.  He contacted his podiatrist, Dr. Luana Shu, today and was instructed to come to the ED for antibiotics.  He is planned for amputation in 2 days.  On exam there is swelling, erythema, and tenderness to the left great toe and an area that appears like there could be pus under the skin but there is no fluctuance.  There is erythema and induration extending up to the midfoot, and the whole left lower leg is moderately swollen, which the patient states is acute.  Differential diagnosis includes, but is not limited to, cellulitis, abscess, osteomyelitis, DVT.  Lab work-up is so far  reassuring.  Chemistry is unremarkable.  There is no leukocytosis.  X-ray shows some soft tissue gas, although based on examining the patient this is more consistent with air tracking from his crush injury and open toe wound rather than gas-forming organism.  We will give empiric antibiotics, obtain blood cultures, obtain MRI of the foot to rule out osteomyelitis and ultrasound to rule out DVT, and plan for admission.  I consulted Dr. Vickki Muff from podiatry who agrees with this plan and recommends admitting to hospitalist service for antibiotics.  The patient will likely have  surgery tomorrow.  ----------------------------------------- 5:40 PM on 05/13/2021 -----------------------------------------  DVT study is negative.  The patient was unable to obtain an MRI due to having a spinal stimulator in place.  CT will be obtained instead.  I consulted Dr. Billie Ruddy from the hospitalist service; based on our discussion she agrees to admit the patient.   FINAL CLINICAL IMPRESSION(S) / ED DIAGNOSES   Final diagnoses:  Cellulitis of left lower extremity     Rx / DC Orders   ED Discharge Orders     None        Note:  This document was prepared using Dragon voice recognition software and may include unintentional dictation errors.    Arta Silence, MD 05/13/21 2312

## 2021-05-13 NOTE — Consult Note (Signed)
Pt off floor to MRI per staff.  Pt with left great toe infection s/p recent trauma.  Known to Dr. Luana Shu in outpt clinic and plan for likely partial amputation of toe.  Will also likely need debridment of wounds.  Dr. Luana Shu will likely perform tomorrow after clinic.  Will provide liquid diet for breakfast and then NPO after 8 am.  Dr. Luana Shu to see tomorrow to discuss surgery in detail.

## 2021-05-13 NOTE — Consult Note (Signed)
Pharmacy Antibiotic Note  Charles [name] is a 61 y.o. male admitted on 05/13/2021 with  infected wound .  Pharmacy has been consulted for Vancomycin and Zosyn dosing.  Plan: Zosyn 3.375gm IV q 8hrs (4hrs infusion)  Vancomycin 2000 mg load, then Vancomycin 1250 mg IV Q 12 hrs. Goal AUC 400-550. Expected AUC: 460 SCr used: 1.06   Height: 6\' 1"  (185.4 cm) Weight: 102.1 kg (225 lb) IBW/kg (Calculated) : 79.9  Temp (24hrs), Avg:98.1 F (36.7 C), Min:98.1 F (36.7 C), Max:98.2 F (36.8 C)  Recent Labs  Lab 05/13/21 1250 05/13/21 1623  WBC 9.9  --   CREATININE 1.06  --   LATICACIDVEN  --  1.0    Estimated Creatinine Clearance: 93.1 mL/min (by C-G formula based on SCr of 1.06 mg/dL).    Allergies  Allergen Reactions   Lisinopril Cough    Antimicrobials this admission: 05/13/21  Zosyn >>  05/13/21  Vancomycin >>   Dose adjustments this admission: none  Microbiology results: 1/24 BCx: pending  Thank you for allowing pharmacy to be a part of this patients care.  Efrain Clauson Rodriguez-Guzman PharmD, BCPS 05/13/2021 7:09 PM

## 2021-05-13 NOTE — ED Provider Triage Note (Signed)
Emergency Medicine Provider Triage Evaluation Note  Charles [name], a 61 y.o. male  was evaluated in triage.  Pt complains of left toe infection.  Patient was advised to report to the ED by his podiatrist, for possible amputation of the left great toe.  Patient with multiple toe and metatarsal amputation presents for evaluation.  He does have cell phone photos of progressive infection to the left great toe.  Review of Systems  Positive: Left great toe infection Negative: FCS  Physical Exam  BP 134/82    Pulse 75    Temp 98.1 F (36.7 C) (Oral)    Resp 16    Ht 6\' 1"  (1.854 m)    Wt 102.1 kg    SpO2 96%    BMI 29.69 kg/m  Gen:   Awake, no distress   Resp:  Normal effort  MSK:   Moves extremities without difficulty Left great toe wound Other:  CVS: RRR  Medical Decision Making  Medically screening exam initiated at 12:59 PM.  Appropriate orders placed.  RANIER COACH was informed that the remainder of the evaluation will be completed by another provider, this initial triage assessment does not replace that evaluation, and the importance of remaining in the ED until their evaluation is complete.  Patient with ED evaluation of progressive infection to the left great toe. Referred by Dr. Obie Dredge for probable amputation.    Melvenia Needles, PA-C 05/13/21 1302

## 2021-05-14 ENCOUNTER — Inpatient Hospital Stay: Payer: Managed Care, Other (non HMO) | Admitting: Anesthesiology

## 2021-05-14 ENCOUNTER — Other Ambulatory Visit: Payer: Self-pay

## 2021-05-14 ENCOUNTER — Encounter
Admission: EM | Disposition: A | Discharge status: Home / Self Care | Payer: Self-pay | Source: Home / Self Care | Attending: Internal Medicine

## 2021-05-14 ENCOUNTER — Inpatient Hospital Stay: Payer: Managed Care, Other (non HMO)

## 2021-05-14 DIAGNOSIS — L03032 Cellulitis of left toe: Secondary | ICD-10-CM | POA: Diagnosis not present

## 2021-05-14 DIAGNOSIS — S92405A Nondisplaced unspecified fracture of left great toe, initial encounter for closed fracture: Secondary | ICD-10-CM | POA: Diagnosis not present

## 2021-05-14 DIAGNOSIS — D649 Anemia, unspecified: Secondary | ICD-10-CM | POA: Diagnosis not present

## 2021-05-14 HISTORY — PX: AMPUTATION TOE: SHX6595

## 2021-05-14 HISTORY — PX: IRRIGATION AND DEBRIDEMENT FOOT: SHX6602

## 2021-05-14 LAB — MAGNESIUM: Magnesium: 2 mg/dL (ref 1.7–2.4)

## 2021-05-14 LAB — CBC
HCT: 33.3 % — ABNORMAL LOW (ref 39.0–52.0)
Hemoglobin: 10.9 g/dL — ABNORMAL LOW (ref 13.0–17.0)
MCH: 28.4 pg (ref 26.0–34.0)
MCHC: 32.7 g/dL (ref 30.0–36.0)
MCV: 86.7 fL (ref 80.0–100.0)
Platelets: 337 10*3/uL (ref 150–400)
RBC: 3.84 MIL/uL — ABNORMAL LOW (ref 4.22–5.81)
RDW: 13.6 % (ref 11.5–15.5)
WBC: 8.1 10*3/uL (ref 4.0–10.5)
nRBC: 0 % (ref 0.0–0.2)

## 2021-05-14 LAB — BASIC METABOLIC PANEL
Anion gap: 6 (ref 5–15)
BUN: 14 mg/dL (ref 6–20)
CO2: 27 mmol/L (ref 22–32)
Calcium: 8.5 mg/dL — ABNORMAL LOW (ref 8.9–10.3)
Chloride: 104 mmol/L (ref 98–111)
Creatinine, Ser: 0.95 mg/dL (ref 0.61–1.24)
GFR, Estimated: >60 mL/min (ref >60)
Glucose, Bld: 111 mg/dL — ABNORMAL HIGH (ref 70–99)
Potassium: 4.3 mmol/L (ref 3.5–5.1)
Sodium: 137 mmol/L (ref 135–145)

## 2021-05-14 LAB — SURGICAL PCR SCREEN
MRSA, PCR: POSITIVE — AB
Staphylococcus aureus: POSITIVE — AB

## 2021-05-14 LAB — HIV ANTIBODY (ROUTINE TESTING W REFLEX): HIV Screen 4th Generation wRfx: NONREACTIVE

## 2021-05-14 SURGERY — AMPUTATION, TOE
Anesthesia: General | Site: Toe | Laterality: Left

## 2021-05-14 MED ORDER — SODIUM CHLORIDE 0.9 % IV SOLN
2.0000 g | Freq: Two times a day (BID) | INTRAVENOUS | Status: DC
  Administered 2021-05-15: 2 g via INTRAVENOUS
  Filled 2021-05-14 (×4): qty 2

## 2021-05-14 MED ORDER — MIDAZOLAM HCL 2 MG/2ML IJ SOLN
INTRAMUSCULAR | Status: AC
  Filled 2021-05-14: qty 2

## 2021-05-14 MED ORDER — ACETAMINOPHEN 10 MG/ML IV SOLN: 1000.0000 mg | Freq: Once | INTRAVENOUS | Status: DC | PRN

## 2021-05-14 MED ORDER — PROPOFOL 1000 MG/100ML IV EMUL
INTRAVENOUS | Status: AC
  Filled 2021-05-14: qty 100

## 2021-05-14 MED ORDER — KETAMINE HCL 10 MG/ML IJ SOLN
INTRAMUSCULAR | Status: DC | PRN
  Administered 2021-05-14: 20 mg via INTRAVENOUS
  Administered 2021-05-14: 10 mg via INTRAVENOUS

## 2021-05-14 MED ORDER — BUPIVACAINE HCL 0.5 % IJ SOLN
INTRAMUSCULAR | Status: DC | PRN
  Administered 2021-05-14: 20 mL

## 2021-05-14 MED ORDER — FENTANYL CITRATE (PF) 100 MCG/2ML IJ SOLN: 25.0000 ug | INTRAMUSCULAR | Status: DC | PRN

## 2021-05-14 MED ORDER — KETAMINE HCL 50 MG/5ML IJ SOSY
PREFILLED_SYRINGE | INTRAMUSCULAR | Status: AC
  Filled 2021-05-14: qty 5

## 2021-05-14 MED ORDER — OXYCODONE HCL 5 MG/5ML PO SOLN: 5.0000 mg | Freq: Once | ORAL | Status: AC | PRN

## 2021-05-14 MED ORDER — LIDOCAINE HCL (CARDIAC) PF 100 MG/5ML IV SOSY
PREFILLED_SYRINGE | INTRAVENOUS | Status: DC | PRN
  Administered 2021-05-14: 100 mg via INTRAVENOUS

## 2021-05-14 MED ORDER — PROPOFOL 500 MG/50ML IV EMUL
INTRAVENOUS | Status: DC | PRN
  Administered 2021-05-14: 75 ug/kg/min via INTRAVENOUS

## 2021-05-14 MED ORDER — MIDAZOLAM HCL 2 MG/2ML IJ SOLN
INTRAMUSCULAR | Status: DC | PRN
  Administered 2021-05-14: 2 mg via INTRAVENOUS

## 2021-05-14 MED ORDER — LACTATED RINGERS IV SOLN: INTRAVENOUS | Status: DC

## 2021-05-14 MED ORDER — 0.9 % SODIUM CHLORIDE (POUR BTL) OPTIME
TOPICAL | Status: DC | PRN
  Administered 2021-05-14: 18:00:00 500 mL

## 2021-05-14 MED ORDER — OXYCODONE HCL 5 MG PO TABS
5.0000 mg | ORAL_TABLET | Freq: Once | ORAL | Status: AC | PRN
  Administered 2021-05-14: 19:00:00 5 mg via ORAL

## 2021-05-14 MED ORDER — BUPIVACAINE HCL (PF) 0.5 % IJ SOLN
INTRAMUSCULAR | Status: AC
  Filled 2021-05-14: qty 30

## 2021-05-14 MED ORDER — OXYCODONE HCL 5 MG PO TABS
ORAL_TABLET | ORAL | Status: AC
  Filled 2021-05-14: qty 1

## 2021-05-14 MED ORDER — ONDANSETRON HCL 4 MG/2ML IJ SOLN: 4.0000 mg | Freq: Once | INTRAMUSCULAR | Status: DC | PRN

## 2021-05-14 SURGICAL SUPPLY — 37 items
BLADE MED AGGRESSIVE (BLADE) ×3 IMPLANT
BNDG ELASTIC 4X5.8 VLCR STR LF (GAUZE/BANDAGES/DRESSINGS) ×4 IMPLANT
BNDG ESMARK 4X12 TAN STRL LF (GAUZE/BANDAGES/DRESSINGS) ×3 IMPLANT
BNDG GAUZE ELAST 4 BULKY (GAUZE/BANDAGES/DRESSINGS) ×4 IMPLANT
CNTNR SPEC 2.5X3XGRAD LEK (MISCELLANEOUS) ×2
CONT SPEC 4OZ STER OR WHT (MISCELLANEOUS) ×1
CONT SPEC 4OZ STRL OR WHT (MISCELLANEOUS) ×2
CONTAINER SPEC 2.5X3XGRAD LEK (MISCELLANEOUS) ×2 IMPLANT
ELECT REM PT RETURN 9FT ADLT (ELECTROSURGICAL) ×3
ELECTRODE REM PT RTRN 9FT ADLT (ELECTROSURGICAL) ×2 IMPLANT
GAUZE SPONGE 4X4 12PLY STRL (GAUZE/BANDAGES/DRESSINGS) ×8 IMPLANT
GAUZE XEROFORM 1X8 LF (GAUZE/BANDAGES/DRESSINGS) ×4 IMPLANT
GLOVE SURG ENC MOIS LTX SZ7 (GLOVE) ×3 IMPLANT
GLOVE SURG UNDER LTX SZ7 (GLOVE) ×3 IMPLANT
GOWN STRL REUS W/ TWL LRG LVL3 (GOWN DISPOSABLE) ×4 IMPLANT
GOWN STRL REUS W/TWL LRG LVL3 (GOWN DISPOSABLE) ×6
HANDPIECE VERSAJET DEBRIDEMENT (MISCELLANEOUS) ×1 IMPLANT
IV NS IRRIG 3000ML ARTHROMATIC (IV SOLUTION) ×1 IMPLANT
KIT TURNOVER KIT A (KITS) ×3 IMPLANT
MANIFOLD NEPTUNE II (INSTRUMENTS) ×3 IMPLANT
NDL FILTER BLUNT 18X1 1/2 (NEEDLE) ×2 IMPLANT
NDL HYPO 25X1 1.5 SAFETY (NEEDLE) ×4 IMPLANT
NEEDLE FILTER BLUNT 18X 1/2SAF (NEEDLE) ×1
NEEDLE FILTER BLUNT 18X1 1/2 (NEEDLE) ×2 IMPLANT
NEEDLE HYPO 25X1 1.5 SAFETY (NEEDLE) ×6 IMPLANT
NS IRRIG 500ML POUR BTL (IV SOLUTION) ×3 IMPLANT
PACK EXTREMITY ARMC (MISCELLANEOUS) ×3 IMPLANT
PAD ABD DERMACEA PRESS 5X9 (GAUZE/BANDAGES/DRESSINGS) ×1 IMPLANT
SOL PREP PVP 2OZ (MISCELLANEOUS) ×15
SOLUTION PREP PVP 2OZ (MISCELLANEOUS) ×2 IMPLANT
STOCKINETTE STRL 6IN 960660 (GAUZE/BANDAGES/DRESSINGS) ×4 IMPLANT
SUT ETHILON 3-0 FS-10 30 BLK (SUTURE) ×3
SUT VIC AB 3-0 SH 27 (SUTURE) ×3
SUT VIC AB 3-0 SH 27X BRD (SUTURE) ×2 IMPLANT
SUTURE EHLN 3-0 FS-10 30 BLK (SUTURE) ×4 IMPLANT
SWAB CULTURE AMIES ANAERIB BLU (MISCELLANEOUS) ×1 IMPLANT
SYR 10ML LL (SYRINGE) ×4 IMPLANT

## 2021-05-14 NOTE — Op Note (Signed)
PODIATRY / FOOT AND ANKLE SURGERY OPERATIVE REPORT    SURGEON: Caroline More, DPM  PRE-OPERATIVE DIAGNOSIS:  1.  Right and left foot neuropathic ulcerations subcutaneous tissue exposed 2.  Left hallux osteomyelitis secondary to open fracture 3.  Chronic noncompliance 4.  Chronic pain syndrome 5.  Neuropathy  POST-OPERATIVE DIAGNOSIS: Same  PROCEDURE(S): Left partial hallux amputation Right midfoot subcutaneous 100% excisional wound debridement, 2 x 1 x 0.1 cm Left fifth metatarsal phalange joint subcutaneous 100% excisional wound debridement, 2 x 2 x 0.1 cm  HEMOSTASIS: Left ankle tourniquet  ANESTHESIA: MAC  ESTIMATED BLOOD LOSS: 30 cc  FINDING(S): 1.  Open fracture to the left hallux with obvious osteomyelitis 2.  Subcutaneous ulcerations to both feet  PATHOLOGY/SPECIMEN(S): Left hallux with proximal margin marked in purple ink, wound culture left hallux  INDICATIONS:   Charles MCCLAFFERTY is a 61 y.o. male who presents with an open fracture to the left hallux after dropping a hydraulic lift on his left foot.  Patient went to the emergency room and had the area washed out and had a surgically repaired.  Patient was placed on Keflex and instructed to follow-up in clinic.  Patient came to clinic and had obvious bone exposed with no soft tissue coverage at the level of the nailbed with bone sticking out of the wound.  Patient also has chronic ulcerations to the plantar aspects of both feet due to history of noncompliance and neuropathy walking on feet.  Patient also has a history of right transmetatarsal amputation.  Patient has been receiving IV antibiotics.  Discussed all treatment options with patient both conservative and surgical attempts at correction include potential risks and complications and at this time patient is elected for surgical intervention consisting of left partial hallux amputation with wound debridements to both feet.  All questions answered.  No guarantees given.   Consent signed prior to surgical intervention.  DESCRIPTION: After obtaining full informed written consent, the patient was brought back to the operating room and placed supine upon the operating table.  The patient received IV antibiotics prior to induction.  After obtaining adequate anesthesia, the patient was prepped and draped in the standard fashion.  An Esmarch bandage was used to exsanguinate the left lower extremity and the pneumatic ankle tourniquet was inflated.  A preoperative block was performed to the left hallux with 20 cc of half percent Marcaine plain about the left hallux and around the first ray.  Attention was directed to the left hallux where a fishmouth type of incision was made over the IPJ creating a slightly larger plantar flap and dorsal flap.  The incision was made straight to bone.  At this time an extensor tenotomy and capsulotomy was performed followed by release the collateral ligaments and any flexor tendon attachment at that area.  The distal phalanx was disarticulated and passed off in the operative site.  Circumferential dissection was then performed around the proximal phalanx head to the midshaft level.  At this time a sagittal bone saw was used to resect the proximal phalanx at the midshaft and this bone piece was passed off in the operative site and the proximal margin was marked in purple ink.  The left hallux specimen was then passed off in the operative site.  A wound culture was also taken from the left hallux at the area of the open fracture.  The flexor and extensor tendons were cut as far proximally as possible.  There appeared to be some areas of scattered necrosis  to the plantar aspect of the flaps of this area was debrided further to healthy bleeding tissue.  A flush was performed with copious amounts normal sterile saline.  Any bleeding vessels were cauterized with Bovie.  The skin flaps were then reapproximated well coapted with 3-0 nylon in a combination of  simple and horizontal mattress type stitching.  The pneumatic ankle tourniquet was deflated and a prompt hyperemic response was noted to all digits of the left foot.  Some slight delay present to the left hallux but appeared to be pinking up prior to dressing placement.  Attention was directed to the left fifth metatarsal phalangeal joint area where a subcutaneous ulceration was visualized.  Preoperative measurement was the same as postoperative measurement which measured approximately 2 cm x 2 cm x 0.1 cm.  100% excisional subcutaneous wound debridement was performed with combination of 15 blade and Versajet to healthy bleeding base.  Tissue removed consisted of hyperkeratotic tissue, biofilm, and fibrous tissue.  Hemostasis was well achieved with compression.  Attention was then directed to the right midfoot where a subcutaneous ulceration was visualized.  Preoperative measurement and post operative measurement were the same and measured approximately 2 cm x 1 cm x 0.1 cm.  100% excisional subcutaneous wound debridement was performed with a combination of 15 blade and Versajet to healthy bleeding tissue.  Tissue that was removed consisted of hyperkeratotic tissue, biofilm, and fibrous tissue.  Hemostasis was well achieved with compression.  Postoperative dressings were then applied to both feet consisting of Xeroform to the incision of the left hallux and to the wounds of both feet followed by 4 x 4 gauze, ABD, Kerlix, Ace wrap.  The patient tolerated the procedure and anesthesia well was transferred to recovery room vital signs stable vascular status appearing to be intact to both feet.  Patient will be followed up with tomorrow for dressing change of the left foot for evaluation of the left oxygen rotation.  Patient should try to stay off of both feet at all times and weight-bear with heel contact only.  PT orders have been placed.  May likely placed on oral antibiotics upon discharge for 7 days.  Cultures  pending  COMPLICATIONS: None  CONDITION: Good, stable  Caroline More, DPM

## 2021-05-14 NOTE — Anesthesia Postprocedure Evaluation (Signed)
Anesthesia Post Note  Patient: Charles [name]  Procedure(s) Performed: AMPUTATION TOE-Hallux (Left: Toe) IRRIGATION AND DEBRIDEMENT FOOT (Bilateral)  Patient location during evaluation: PACU Anesthesia Type: General Level of consciousness: awake and alert Pain management: pain level controlled Vital Signs Assessment: post-procedure vital signs reviewed and stable Respiratory status: spontaneous breathing, nonlabored ventilation and respiratory function stable Cardiovascular status: blood pressure returned to baseline and stable Postop Assessment: no apparent nausea or vomiting Anesthetic complications: no   No notable events documented.   Last Vitals:  Vitals:   05/14/21 1836 05/14/21 1855  BP: 112/70   Pulse: (!) 57 65  Resp: 11 20  Temp: 36.4 C (!) 36.4 C  SpO2: 98% 99%    Last Pain:  Vitals:   05/14/21 1855  TempSrc:   PainSc: [place]

## 2021-05-14 NOTE — Transfer of Care (Signed)
Immediate Anesthesia Transfer of Care Note  Patient: Charles [name]  Procedure(s) Performed: AMPUTATION TOE-Hallux (Left: Toe) IRRIGATION AND DEBRIDEMENT FOOT (Bilateral)  Patient Location: PACU  Anesthesia Type:MAC  Level of Consciousness: awake  Airway & Oxygen Therapy: Patient Spontanous Breathing  Post-op Assessment: Report given to RN  Post vital signs: Reviewed and stable  Last Vitals:  Vitals Value Taken Time  BP 112/70 05/14/21 1836  Temp    Pulse 58 05/14/21 1839  Resp 7 05/14/21 1839  SpO2 100 % 05/14/21 1839  Vitals shown include unvalidated device data.  Last Pain:  Vitals:   05/14/21 1323  TempSrc:   PainSc: Asleep         Complications: No notable events documented.

## 2021-05-14 NOTE — TOC CM/SW Note (Signed)
CSW acknowledges consult for medication assistance. Did not note any specifics. Patient has insurance so he will be unable to get prescriptions from [facility]. Please consult again if any needs arise.  Dayton Scrape, [place]

## 2021-05-14 NOTE — Anesthesia Preprocedure Evaluation (Addendum)
Anesthesia Evaluation  Patient identified by MRN, date of birth, ID band Patient awake    Reviewed: Allergy & Precautions, NPO status , Patient's Chart, lab work & pertinent test results, reviewed documented beta blocker date and time   Airway Mallampati: I  TM Distance: >3 FB Neck ROM: Full    Dental  (+) Partial Upper   Pulmonary asthma , sleep apnea ,    Pulmonary exam normal breath sounds clear to auscultation       Cardiovascular Exercise Tolerance: Good hypertension, Pt. on home beta blockers (-) angina+ CAD (s/p MI and stents), + Past MI (2017) and + Cardiac Stents (2017)  Normal cardiovascular exam Rhythm:Regular Rate:Normal  ECG 01/31/21: Sinus bradycardia (HR 59), otherwise normal  Echo 11/12/20:  NORMAL LEFT VENTRICULAR SYSTOLIC FUNCTION  NORMAL RIGHT VENTRICULAR SYSTOLIC FUNCTION  NO VALVULAR STENOSIS  TRIVIAL MR, TR  EF 50-55%   Neuro/Psych PSYCHIATRIC DISORDERS (ADHD) Anxiety Depression Chronic pain s/p intrathecal pain pump    Neuromuscular disease (Neuropathy both feet)    GI/Hepatic PUD, GERD  ,S/p gastric bypass 2011   Endo/Other  diabetes, Type 2Hypothyroidism   Renal/GU Renal disease (nephrolithiasis)     Musculoskeletal Gout    Abdominal (+) + obese,   Peds  Hematology  (+) Blood dyscrasia, anemia ,   Anesthesia Other Findings Reviewed 11/19/20 cardiology note.  ECHO 11/20: LVH   Problems: 1. Left hallux osteomyelitis status post crush injury 2. Diabetic foot ulcerations right and left foot 3. Neuropathic pain, history of chronic pain syndrome 4. History of gross noncompliance  Reproductive/Obstetrics                           Anesthesia Physical Anesthesia Plan  ASA: 3  Anesthesia Plan: General   Post-op Pain Management:    Induction: Intravenous  PONV Risk Score and Plan: 2 and Ondansetron, Dexamethasone and Treatment may vary due to age or medical  condition  Airway Management Planned: Natural Airway and Nasal Cannula  Additional Equipment:   Intra-op Plan:   Post-operative Plan: Extubation in OR  Informed Consent: I have reviewed the patients History and Physical, chart, labs and discussed the procedure including the risks, benefits and alternatives for the proposed anesthesia with the patient or authorized representative who has indicated his/her understanding and acceptance.     Dental advisory given  Plan Discussed with: CRNA  Anesthesia Plan Comments: (Patient consented for risks of anesthesia including but not limited to:  - adverse reactions to medications - damage to eyes, teeth, lips or other oral mucosa - nerve damage due to positioning  - sore throat or hoarseness - damage to heart, brain, nerves, lungs, other parts of body or loss of life  Informed patient about role of CRNA in peri- and intra-operative care.  Patient voiced understanding.)       Anesthesia Quick Evaluation

## 2021-05-14 NOTE — Progress Notes (Signed)
Patient awake/alert x4.  Xray completed of bil feet. Dressing c/d/I Dr. Luana Shu spoke to patient at bedside in pacu, reviewed procedure, plan of care with patient, verbalizes understanding .

## 2021-05-14 NOTE — Progress Notes (Signed)
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  Patient had a left great toe the area crushed by a hydraulic dock at work on 10/24/2374 and was scheduled for toe amputation with podiatry on 05/15/2021 but his pain got to the point where he got worse and therefore he presented to the ED.  He has chronic foot ulcers on both his feet however he denied having any diabetes mellitus since his ulcers were from standing on his feet for prolonged periods of time at work.  He denies any fevers and otherwise was noted to have a normal WBC and normal lactic acid level.  CT of the foot showed fracture through the tip of the left great toe distal phalanx.  There is no bone destruction to suggest acute osteomyelitis but there is diffuse edema throughout the subcutaneous soft tissues.  Patient was started on IV vancomycin and Zosyn in the ED and this was continued but then this was changed to IV Cefepime  Assessment & Plan:   Principal Problem:   Cellulitis of left toe  Left Great Toe Infection with associated Pain  Cellulitis and Fracture of the Left Great Toe -S/p Recent Trauma from Crush Injury  -CT Left Foot w/ Contrast showed "Fracture through the tip of the left great toe distal phalanx as seen by plain film. The bone fragment appears to be near the skin surface at the nail bed with a small amount of adjacent soft tissue gas. No bone destruction to suggest acute osteomyelitis. Diffuse edema throughout the subcutaneous soft tissues." -Podiatry consulted for further evaluation and recommendations -Continued  Abx with IV Zosyn and IV Vancomycin but will change IV Zosyn to IV Cefepime -Pain Control as below; Given IV Morphine 4 mg x1 in the ED -Plan\ is for likely partial amputation of the toe and will need debridement of wounds  HTN -C/w Carvedilol 12.5 mg po BID; His home Amlodpine was held on Admission -Continue to Monitor BP per Protocol -Last BP reading was 111/69  Depression and Anxiety -C/w Duloxetine 60 mg po Daily and Buproprion XL 150 mg po Daily  Hypothyroidism -Check TSH in the AM  -C/w Levothyroxine 75 mcg po Daily  HLD -Statin currently held  Chronic Back Pain  -Has a Spinal Stimulator -C/w Oxycodone 1-2 tab po q4hprn Severe Pain, Moderate Pain  BPH -C/w Tamsulosin 0.4 mg po Daily   Normocytic Anemia -Patient's Hgb/Hct went from 11.5/36.3 -> 10.9/33.3 -Check Anemia Panel in the AM -Continue to Monitor for S/Sx of Bleeding; No overt bleeding noted -Repeat CBC in the AM   Obesity -Complicates overall prognosis and care -Estimated body mass index is 30.34 kg/m as calculated from the following:   Height as of this encounter: 6\' 1"  (1.854 m).   Weight as of this encounter: 104.3 kg.  -Weight Loss and Dietary Counseling given  DVT prophylaxis: Enoxaparin 40 mg sq q24h Code Status: FULL CODE  Family Communication: No family present at bedside  Disposition Plan: Pending further clinical improvement and clearance by Podiatry   Status is: Inpatient  Remains inpatient appropriate because: Patient will be going for surgical  intervention and needs pain adequately controlled prior to D/C   Consultants:  Podiatry   Procedures: None  Antimicrobials:  Anti-infectives (From admission, onward)    Start     Dose/Rate Route Frequency Ordered Stop   05/14/21 0800  vancomycin (VANCOREADY) IVPB 1250 mg/250 mL        1,250 mg 166.7 mL/hr over 90 Minutes Intravenous Every 12 hours 05/13/21 1907     05/14/21 0000  piperacillin-tazobactam (ZOSYN) IVPB 3.375 g        3.375  g 12.5 mL/hr over 240 Minutes Intravenous Every 8 hours 05/13/21 1852     05/13/21 1915  vancomycin (VANCOCIN) IVPB 1000 mg/200 mL premix        1,000 mg 200 mL/hr over 60 Minutes Intravenous  Once 05/13/21 1903 05/13/21 2241   05/13/21 1615  vancomycin (VANCOCIN) IVPB 1000 mg/200 mL premix        1,000 mg 200 mL/hr over 60 Minutes Intravenous  Once 05/13/21 1612 05/13/21 1819   05/13/21 1615  piperacillin-tazobactam (ZOSYN) IVPB 3.375 g        3.375 g 100 mL/hr over 30 Minutes Intravenous  Once 05/13/21 1612 05/13/21 1657       Subjective: Seen and examined at bedside and was complaining of some toe pain and was complaining of being hungry.  No nausea or vomiting.  Denies any lightheadedness or dizziness.  No other concerns or complaints at this time.  Objective: Vitals:   05/13/21 2039 05/13/21 2324 05/14/21 0528 05/14/21 0729  BP: (!) 152/74 121/64 114/63 130/77  Pulse: 81 74 70 72  Resp: 18 17 16 16   Temp: 98.5 F (36.9 C)  99 F (37.2 C) 99.4 F (37.4 C)  TempSrc:      SpO2: 98% 97% 93% 96%  Weight: 104.3 kg     Height: 6\' 1"  (1.854 m)       Intake/Output Summary (Last 24 hours) at 05/14/2021 0842 Last data filed at 05/14/2021 6283 Gross per 24 hour  Intake 456.78 ml  Output 2525 ml  Net -2068.22 ml   Filed Weights   05/13/21 1243 05/13/21 2039  Weight: 102.1 kg 104.3 kg   Examination: Physical Exam:  Constitutional: WN/WD obese Caucasian male in NAD and appears calm  Eyes:  Lids and conjunctivae normal, sclerae anicteric  ENMT: External Ears, Nose appear normal. Grossly normal hearing.  Neck: Appears normal, supple, no cervical masses, normal ROM, no appreciable thyromegaly; no JVD Respiratory: Diminished to auscultation bilaterally, no wheezing, rales, rhonchi or crackles. Normal respiratory effort and patient is not tachypenic. No accessory muscle use. Unlabored breathing  Cardiovascular: RRR, no murmurs / rubs / gallops. S1 and S2 auscultated.  Abdomen:  Soft, non-tender, Distended 2/2 body habitus. Bowel sounds positive.  GU: Deferred. Musculoskeletal: No clubbing / cyanosis of digits/nails.  Has a transmetatarsal amputation on the right with some ulcers and his left big toe was inflamed and cellulitic Skin: No appreciable rashes on limited skin evaluation. No induration; Warm and dry.  Neurologic: CN 2-12 grossly intact with no focal deficits. Romberg sign and cerebellar reflexes not assessed.  Psychiatric: Normal judgment and insight. Alert and oriented x 3. Normal mood and appropriate affect.   Data Reviewed: I have personally reviewed following labs and imaging studies  CBC: Recent Labs  Lab 05/13/21 1250 05/14/21 0606  WBC 9.9 8.1  NEUTROABS 7.2  --   HGB 11.5* 10.9*  HCT 36.3* 33.3*  MCV 88.3 86.7  PLT 369 151   Basic Metabolic Panel:  Recent Labs  Lab 05/13/21 1250 05/14/21 0606  NA 133* 137  K 4.5 4.3  CL 102 104  CO2 24 27  GLUCOSE 116* 111*  BUN 26* 14  CREATININE 1.06 0.95  CALCIUM 8.7* 8.5*  MG  --  2.0   GFR: Estimated Creatinine Clearance: 104.9 mL/min (by C-G formula based on SCr of 0.95 mg/dL). Liver Function Tests: Recent Labs  Lab 05/13/21 1250  AST 22  ALT 24  ALKPHOS 109  BILITOT 0.3  PROT 7.5  ALBUMIN 3.4*   No results for input(s): LIPASE, AMYLASE in the last 168 hours. No results for input(s): AMMONIA in the last 168 hours. Coagulation Profile: No results for input(s): INR, PROTIME in the last 168 hours. Cardiac Enzymes: No results for input(s): CKTOTAL, CKMB, CKMBINDEX, TROPONINI in the last 168 hours. BNP (last 3 results) No results for input(s): PROBNP in the last 8760 hours. HbA1C: No results for input(s): HGBA1C in the last 72 hours. CBG: No results for input(s): GLUCAP in the last 168 hours. Lipid Profile: No results for input(s): CHOL, HDL, LDLCALC, TRIG, CHOLHDL, LDLDIRECT in the last 72 hours. Thyroid Function Tests: No results for input(s): TSH, T4TOTAL, FREET4, T3FREE,  THYROIDAB in the last 72 hours. Anemia Panel: No results for input(s): VITAMINB12, FOLATE, FERRITIN, TIBC, IRON, RETICCTPCT in the last 72 hours. Sepsis Labs: Recent Labs  Lab 05/13/21 1623 05/13/21 2151  LATICACIDVEN 1.0 1.4    Recent Results (from the past 240 hour(s))  Resp Panel by RT-PCR (Flu A&B, Covid) Nasopharyngeal Swab     Status: None   Collection Time: 05/13/21  4:23 PM   Specimen: Nasopharyngeal Swab; Nasopharyngeal(NP) swabs in vial transport medium  Result Value Ref Range Status   SARS Coronavirus 2 by RT PCR NEGATIVE NEGATIVE Final    Comment: (NOTE) SARS-CoV-2 target nucleic acids are NOT DETECTED.  The SARS-CoV-2 RNA is generally detectable in upper respiratory specimens during the acute phase of infection. The lowest concentration of SARS-CoV-2 viral copies this assay can detect is 138 copies/mL. A negative result does not preclude SARS-Cov-2 infection and should not be used as the sole basis for treatment or other patient management decisions. A negative result may occur with  improper specimen collection/handling, submission of specimen other than nasopharyngeal swab, presence of viral mutation(s) within the areas targeted by this assay, and inadequate number of viral copies(<138 copies/mL). A negative result must be combined with clinical observations, patient history, and epidemiological information. The expected result is Negative.  Fact Sheet for Patients:  EntrepreneurPulse.com.au  Fact Sheet for Healthcare Providers:  IncredibleEmployment.be  This test is no t yet approved or cleared by the [place] FDA and  has been authorized for detection and/or diagnosis of SARS-CoV-2 by FDA under an Emergency Use Authorization (EUA). This EUA will remain  in effect (meaning this test can be used) for the duration of the COVID-19 declaration under Section 564(b)(1) of the Act, 21 U.S.C.section 360bbb-3(b)(1), unless the  authorization is terminated  or revoked sooner.       Influenza A by PCR NEGATIVE NEGATIVE Final   Influenza B by PCR NEGATIVE NEGATIVE Final    Comment: (NOTE) The Xpert Xpress SARS-CoV-2/FLU/RSV plus assay is intended as an aid in the diagnosis of influenza from Nasopharyngeal swab specimens and should not be used as a sole basis for treatment. Nasal washings and aspirates are unacceptable for Xpert Xpress SARS-CoV-2/FLU/RSV testing.  Fact Sheet for Patients: EntrepreneurPulse.com.au  Fact Sheet for Healthcare Providers: IncredibleEmployment.be  This test is  not yet approved or cleared by the [place] and has been authorized for detection and/or diagnosis of SARS-CoV-2 by FDA under an Emergency Use Authorization (EUA). This EUA will remain in effect (meaning this test can be used) for the duration of the COVID-19 declaration under Section 564(b)(1) of the Act, 21 U.S.C. section 360bbb-3(b)(1), unless the authorization is terminated or revoked.  Performed at [facility], [address], [place], [place] [address]   Surgical PCR screen     Status: Abnormal   Collection Time: 05/13/21 10:33 PM   Specimen: Nasal Mucosa; Nasal Swab  Result Value Ref Range Status   MRSA, PCR POSITIVE (A) NEGATIVE Final    Comment: RESULT CALLED TO, READ BACK BY AND VERIFIED WITH: MARSHELL TURNER @ 0013 ON 05/14/2021.Marland KitchenMarland KitchenTKR    Staphylococcus aureus POSITIVE (A) NEGATIVE Final    Comment: RESULT CALLED TO, READ BACK BY AND VERIFIED WITH: MARSHELL TURNER @ 0013 ON 05/14/2021.Marland KitchenMarland KitchenTKR (NOTE) The Xpert SA Assay (FDA approved for NASAL specimens in patients 17 years of age and older), is one component of a comprehensive surveillance program. It is not intended to diagnose infection nor to guide or monitor treatment. Performed at [facility], [address] [place], [place] [address]     RN Pressure Injury Documentation: Pressure  Injury 12/01/18 Foot Right Stage IV - Full thickness tissue loss with exposed bone, tendon or muscle. (Active)  12/01/18 1821  Location: Foot  Location Orientation: Right  Staging: Stage IV - Full thickness tissue loss with exposed bone, tendon or muscle.  Wound Description (Comments):   Present on Admission: Yes     Estimated body mass index is 30.34 kg/m as calculated from the following:   Height as of this encounter: 6\' 1"  (1.854 m).   Weight as of this encounter: 104.3 kg.  Malnutrition Type:   Malnutrition Characteristics:   Nutrition Interventions:    Radiology Studies: CT FOOT LEFT W CONTRAST  Result Date: 05/13/2021 CLINICAL DATA:  Foot swelling, nondiabetic, osteomyelitis suspected. Left great toe injury. EXAM: CT OF THE LOWER LEFT EXTREMITY WITH CONTRAST TECHNIQUE: Multidetector CT imaging of the lower left extremity was performed according to the standard protocol following intravenous contrast administration. RADIATION DOSE REDUCTION: This exam was performed according to the departmental dose-optimization program which includes automated exposure control, adjustment of the mA and/or kV according to patient size and/or use of iterative reconstruction technique. CONTRAST:  36mL OMNIPAQUE IOHEXOL 300 MG/ML  SOLN COMPARISON:  None. FINDINGS: Bones/Joint/Cartilage Fracture at the tip of the left great toe distal phalanx. The distal fragment is now displaced and appears to be along the skin surface near the nail bed. Small amount of gas is seen within the underlying soft tissue between the nail bed and the distal phalanx. No visible bone destruction to suggest osteomyelitis. No additional fracture. Degenerative changes in the midfoot and hindfoot. Plantar calcaneal spur. Ligaments Suboptimally assessed by CT. Muscles and Tendons Grossly unremarkable Soft tissues Edema within the subcutaneous soft tissues diffusely. As described above. Small amount of gas in the left great toe soft  tissues near the nail bed. No focal fluid collection to suggest abscess. IMPRESSION: Fracture through the tip of the left great toe distal phalanx as seen by plain film. The bone fragment appears to be near the skin surface at the nail bed with a small amount of adjacent soft tissue gas. No bone destruction to suggest acute osteomyelitis. Diffuse edema throughout the subcutaneous soft tissues. Electronically Signed   By: Lennette Bihari  [place] M.D.   On: 05/13/2021 18:08   US Venous Img Lower Unilateral Left  Result Date: 05/13/2021 CLINICAL DATA:  LEFT lower extremity swelling. EXAM: LEFT LOWER EXTREMITY VENOUS DOPPLER ULTRASOUND TECHNIQUE: Gray-scale sonography with compression, as well as color and duplex ultrasound, were performed to evaluate the deep venous system(s) from the level of the common femoral vein through the popliteal and proximal calf veins. COMPARISON:  LEFT foot XRs, concurrent. FINDINGS: VENOUS Normal compressibility of the LEFT common femoral, superficial femoral, and popliteal veins, as well as the visualized calf veins. Visualized portions of profunda femoral vein and great saphenous vein unremarkable. No filling defects to suggest DVT on grayscale or color Doppler imaging. Doppler waveforms show normal direction of venous flow, normal respiratory plasticity and response to augmentation. Limited views of the contralateral common femoral vein are unremarkable. OTHER No evidence of superficial thrombophlebitis or abnormal fluid collection. Distal LEFT lower extremity subcutaneous edema. Limitations: none IMPRESSION: No evidence of femoropopliteal DVT within the LEFT lower extremity. Michaelle Birks, MD [facility] Electronically Signed   By: Michaelle Birks M.D.   On: 05/13/2021 17:08   DG Foot Complete Left  Result Date: 05/13/2021 CLINICAL DATA:  Great toe infection after crushing in hydraulic doc EXAM: LEFT FOOT - COMPLETE 3+ VIEW COMPARISON:   Foot radiographs 05/06/2021 FINDINGS: Again seen is a displaced fracture of the distal tuft of the great toe distal phalanx. The fracture fragment appears more displaced on the current study than on 05/06/2021. Lucency in the surrounding soft tissues may reflect gas. There is no definite osseous destruction or erosion. There is soft tissue swelling over the dorsum of the foot. There is no other acute fracture. Alignment is otherwise normal. The Lisfranc and Chopart joints are intact. There is mild degenerative change about the midfoot and inferior calcaneal spurring. IMPRESSION: Displaced fracture of the distal tuft of the great toe distal phalanx with increased displacement compared to the study from 05/06/2021. There is surrounding soft tissue swelling with lucency that may reflect soft tissue gas. Infection with gas-forming organism can not be excluded. There is no definite osseous erosion or destruction, though osteomyelitis is not excluded. Electronically Signed   By: Valetta Mole M.D.   On: 05/13/2021 13:45    Scheduled Meds:  buPROPion  150 mg Oral Daily   carvedilol  12.5 mg Oral BID WC   DULoxetine  60 mg Oral Daily   enoxaparin (LOVENOX) injection  40 mg Subcutaneous Q24H   levothyroxine  75 mcg Oral Q0600   mupirocin ointment  1 application Nasal BID   tamsulosin  0.4 mg Oral Daily   Continuous Infusions:  sodium chloride 10 mL/hr at 05/13/21 2139   piperacillin-tazobactam (ZOSYN)  IV 3.375 g (05/13/21 2321)   vancomycin 1,250 mg (05/14/21 0715)    LOS: 1 day   Kerney Elbe, DO [facility] PAGER is on [place]  If 7PM-7AM, please contact night-coverage [email or web address]

## 2021-05-14 NOTE — H&P (Signed)
HISTORY AND PHYSICAL INTERVAL NOTE:  05/14/2021  5:18 PM  Charles [name]  has presented today for surgery, with the diagnosis of bil foot wounds, left hallux ostemyelitis  The various methods of treatment have been discussed with the patient.  No guarantees were given.  After consideration of risks, benefits and other options for treatment, the patient has consented to surgery.  I have reviewed the patients chart and labs.    PROCEDURE: LEFT HALLUX AMPUTATION LEFT AND RIGHT FOOT SUBCUTANEOUS 100% EXCISIONAL WOUND DEBRIDEMENTS   A history and physical examination was performed in the hospital.  The patient was reexamined.  There have been no changes to this history and physical examination.  Caroline More, DPM

## 2021-05-14 NOTE — Consult Note (Signed)
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 62, temperature 97.9 F (36.6 C), resp. rate 16, height 6\' 1"  (1.854 m), weight 104.3 kg, SpO2 98 %.   Assessment Left hallux osteomyelitis status post crush injury Diabetic foot ulcerations right and left foot Neuropathic pain, history of chronic pain syndrome History of gross noncompliance  Plan -Patient seen and examined. -X-ray and CT imaging reviewed showing fracture of the distal phalanx of the hallux.  Clinically has obvious bone exposed to the dorsal aspect of the left distal phalanx with 0 soft tissue coverage. -Discussed treatment options with patient in detail. -Discussed all treatment options with the patient both conservative and surgical attempts at  correction including potential risks and complications at this time patient is elected for surgical procedure consisting of left hallux amputation with debridement of ulcerations both feet.  No guarantees given.  Consent obtained.  Discussed patient is at high risk for limb loss due to history of noncompliance, diabetic foot ulcerations, neuropathy, infection present.  All questions answered. -Appreciate medicine recommendations for antibiotic therapy.  We will take deep tissue culture and surgery. -Patient should try to stay off of both feet as much as possible to ensure proper wound healing.  Patient continues to be noncompliant with this. -Patient currently NPO.  Plan for surgery tonight around 5 PM.  We will follow-up with patient again tomorrow for further recommendations and likely for subsequent discharge within the next 1 to 2 days.  Caroline More, DPM 05/14/2021, 1:27 PM

## 2021-05-15 ENCOUNTER — Encounter: Payer: Self-pay | Admitting: Podiatry

## 2021-05-15 ENCOUNTER — Ambulatory Visit: Admit: 2021-05-15 | Payer: Managed Care, Other (non HMO) | Admitting: Podiatry

## 2021-05-15 DIAGNOSIS — M869 Osteomyelitis, unspecified: Secondary | ICD-10-CM

## 2021-05-15 DIAGNOSIS — L089 Local infection of the skin and subcutaneous tissue, unspecified: Secondary | ICD-10-CM

## 2021-05-15 DIAGNOSIS — S99929A Unspecified injury of unspecified foot, initial encounter: Secondary | ICD-10-CM

## 2021-05-15 DIAGNOSIS — L98499 Non-pressure chronic ulcer of skin of other sites with unspecified severity: Secondary | ICD-10-CM

## 2021-05-15 DIAGNOSIS — L03032 Cellulitis of left toe: Secondary | ICD-10-CM | POA: Diagnosis not present

## 2021-05-15 HISTORY — DX: Presence of dental prosthetic device (complete) (partial): Z97.2

## 2021-05-15 LAB — HEMOGLOBIN A1C
Hgb A1c MFr Bld: 5.7 % — ABNORMAL HIGH (ref 4.8–5.6)
Mean Plasma Glucose: 117 mg/dL

## 2021-05-15 LAB — FOLATE: Folate: 11.8 ng/mL (ref >5.9)

## 2021-05-15 LAB — CBC WITH DIFFERENTIAL/PLATELET
Abs Immature Granulocytes: 0.03 10*3/uL (ref 0.00–0.07)
Basophils Absolute: 0 10*3/uL (ref 0.0–0.1)
Basophils Relative: 0 %
Eosinophils Absolute: 0.3 10*3/uL (ref 0.0–0.5)
Eosinophils Relative: 4 %
HCT: 34.1 % — ABNORMAL LOW (ref 39.0–52.0)
Hemoglobin: 10.8 g/dL — ABNORMAL LOW (ref 13.0–17.0)
Immature Granulocytes: 0 %
Lymphocytes Relative: 30 %
Lymphs Abs: 2.1 10*3/uL (ref 0.7–4.0)
MCH: 27.7 pg (ref 26.0–34.0)
MCHC: 31.7 g/dL (ref 30.0–36.0)
MCV: 87.4 fL (ref 80.0–100.0)
Monocytes Absolute: 0.6 10*3/uL (ref 0.1–1.0)
Monocytes Relative: 9 %
Neutro Abs: 4 10*3/uL (ref 1.7–7.7)
Neutrophils Relative %: 57 %
Platelets: 338 10*3/uL (ref 150–400)
RBC: 3.9 MIL/uL — ABNORMAL LOW (ref 4.22–5.81)
RDW: 13.8 % (ref 11.5–15.5)
WBC: 7 10*3/uL (ref 4.0–10.5)
nRBC: 0 % (ref 0.0–0.2)

## 2021-05-15 LAB — COMPREHENSIVE METABOLIC PANEL
ALT: 17 U/L (ref 0–44)
AST: 19 U/L (ref 15–41)
Albumin: 2.8 g/dL — ABNORMAL LOW (ref 3.5–5.0)
Alkaline Phosphatase: 95 U/L (ref 38–126)
Anion gap: 7 (ref 5–15)
BUN: 14 mg/dL (ref 6–20)
CO2: 27 mmol/L (ref 22–32)
Calcium: 8.5 mg/dL — ABNORMAL LOW (ref 8.9–10.3)
Chloride: 103 mmol/L (ref 98–111)
Creatinine, Ser: 0.82 mg/dL (ref 0.61–1.24)
GFR, Estimated: >60 mL/min (ref >60)
Glucose, Bld: 91 mg/dL (ref 70–99)
Potassium: 4.3 mmol/L (ref 3.5–5.1)
Sodium: 137 mmol/L (ref 135–145)
Total Bilirubin: 0.3 mg/dL (ref 0.3–1.2)
Total Protein: 6.4 g/dL — ABNORMAL LOW (ref 6.5–8.1)

## 2021-05-15 LAB — RETICULOCYTES
Immature Retic Fract: 10.7 % (ref 2.3–15.9)
RBC.: 3.91 MIL/uL — ABNORMAL LOW (ref 4.22–5.81)
Retic Count, Absolute: 29.7 10*3/uL (ref 19.0–186.0)
Retic Ct Pct: 0.8 % (ref 0.4–3.1)

## 2021-05-15 LAB — TSH: TSH: 1.583 u[IU]/mL (ref 0.350–4.500)

## 2021-05-15 LAB — IRON AND TIBC
Iron: 17 ug/dL — ABNORMAL LOW (ref 45–182)
Saturation Ratios: 6 % — ABNORMAL LOW (ref 17.9–39.5)
TIBC: 273 ug/dL (ref 250–450)
UIBC: 256 ug/dL

## 2021-05-15 LAB — FERRITIN: Ferritin: 87 ng/mL (ref 24–336)

## 2021-05-15 LAB — VITAMIN B12: Vitamin B-12: 220 pg/mL (ref 180–914)

## 2021-05-15 LAB — MAGNESIUM: Magnesium: 2.1 mg/dL (ref 1.7–2.4)

## 2021-05-15 LAB — PHOSPHORUS: Phosphorus: 3.5 mg/dL (ref 2.5–4.6)

## 2021-05-15 SURGERY — AMPUTATION, TOE
Anesthesia: Choice | Site: Toe | Laterality: Left

## 2021-05-15 MED ORDER — AMOXICILLIN-POT CLAVULANATE 875-125 MG PO TABS: 1.0000 | ORAL_TABLET | Freq: Two times a day (BID) | ORAL | Status: DC

## 2021-05-15 MED ORDER — OXYCODONE HCL 10 MG PO TABS: 10.0000 mg | ORAL_TABLET | ORAL | 0 refills | Status: AC | PRN

## 2021-05-15 MED ORDER — POLYSACCHARIDE IRON COMPLEX 150 MG PO CAPS
150.0000 mg | ORAL_CAPSULE | Freq: Every day | ORAL | Status: DC
  Administered 2021-05-15: 150 mg via ORAL
  Filled 2021-05-15: qty 1

## 2021-05-15 MED ORDER — AMOXICILLIN-POT CLAVULANATE 875-125 MG PO TABS: 1.0000 | ORAL_TABLET | Freq: Two times a day (BID) | ORAL | 0 refills | Status: AC

## 2021-05-15 MED ORDER — POLYSACCHARIDE IRON COMPLEX 150 MG PO CAPS: 150.0000 mg | ORAL_CAPSULE | Freq: Every day | ORAL | 0 refills | Status: DC

## 2021-05-15 MED ORDER — SULFAMETHOXAZOLE-TRIMETHOPRIM 800-160 MG PO TABS: 1.0000 | ORAL_TABLET | Freq: Two times a day (BID) | ORAL | 0 refills | Status: AC

## 2021-05-15 NOTE — Evaluation (Signed)
Occupational Therapy Evaluation Patient Details Name: Charles [name] MRN: [id number] DOB: 1960-07-13 Today's Date: 05/15/2021   History of Present Illness Charles [name] is a 61 y.o. male with medical history significant of foot ulcers s/p transmetatarsal amputation right foot, HTN, chronic back pain with spine stimulator who presented with uncontrolled left great toe pain. Pt's left great toe got crushed by hydraulic dock at work on 0/01/74.  Pt is scheduled for toe amputation with podiatry on 05/15/21, however, pt has worsening controlled pain in his toe and therefore presented to the ED.  Pt also has chronic foot ulcers on both feet, however, pt denied having DM, and said ulcers were from standing on his feet for prolonged periods of time at work.  No fever.  ROS otherwise neg.   Clinical Impression   Upon entering the room, pt supine in bed and agreeable to OT intervention. Pt reports being independent at baseline and working full time in warehouse. Pt lives with wife at home and she works during the day. Pt performed bed mobility independently and donned B surgical shoes without assistance. Pt ambulates with and without RW in room at mod I level and demonstrates ability to perform toileting without assistance. Pt reports only having to ambulate ~ 15 feet to get to bathroom. He declines 3 in 1 commode chair. OT does also recommend wheelchair to decrease weight bearing thru B LEs and for pain management. Pt does report soreness with short distance ambulation in room. OT also recommending pt utilize hand held mirror to inspect B LEs once returning home. Pt with no further acute OT needs. OT to SIGN OFF.      Recommendations for follow up therapy are one component of a multi-disciplinary discharge planning process, led by the attending physician.  Recommendations may be updated based on patient status, additional functional criteria and insurance authorization.   Follow Up Recommendations  No OT  follow up    Assistance Recommended at Discharge PRN     Functional Status Assessment  Patient has not had a recent decline in their functional status  Equipment Recommendations  Wheelchair (measurements OT);Other (comment) (recommended pt purchase mirror to inspect B LE feet)       Precautions / Restrictions Precautions Precautions: Fall Restrictions Weight Bearing Restrictions: No      Mobility Bed Mobility Overal bed mobility: Independent                  Transfers Overall transfer level: Independent Equipment used: Rolling walker (2 wheels), None                      Balance Overall balance assessment: Independent                                         ADL either performed or assessed with clinical judgement   ADL Overall ADL's : Modified independent                                             Vision Patient Visual Report: No change from baseline              Pertinent Vitals/Pain Pain Assessment Pain Assessment: Faces Faces Pain Scale: Hurts a little bit Pain Location: B feet Pain Descriptors /  Indicators: Discomfort, Sore Pain Intervention(s): Limited activity within patient's tolerance, Monitored during session     Hand Dominance Right   Extremity/Trunk Assessment Upper Extremity Assessment Upper Extremity Assessment: Overall WFL for tasks assessed   Lower Extremity Assessment Lower Extremity Assessment: Overall WFL for tasks assessed   Cervical / Trunk Assessment Cervical / Trunk Assessment: Normal   Communication Communication Communication: No difficulties   Cognition Arousal/Alertness: Awake/alert Behavior During Therapy: WFL for tasks assessed/performed Overall Cognitive Status: Within Functional Limits for tasks assessed                                                  Home Living Family/patient expects to be discharged to:: Private residence Living  Arrangements: Spouse/significant other Available Help at Discharge: Family;Available PRN/intermittently Type of Home: House Home Access: Stairs to enter [employer] of Steps: 2 Entrance Stairs-Rails: None Home Layout: One level     Bathroom Shower/Tub: Walk-in shower;Tub/shower unit   Bathroom Toilet: Standard     Home Equipment: Conservation officer, nature (2 wheels);Cane - single point;Shower seat          Prior Functioning/Environment Prior Level of Function : Independent/Modified Independent             Mobility Comments: patient was independent prior to admission, working, driving. ADLs Comments: independent                 OT Goals(Current goals can be found in the care plan section) Acute Rehab OT Goals Patient Stated Goal: to return home OT Goal Formulation: With patient Time For Goal Achievement: 05/15/21 Potential to Achieve Goals: Good  OT Frequency:         AM-PAC OT "6 Clicks" Daily Activity     Outcome Measure   Help from another person taking care of personal grooming?: None Help from another person toileting, which includes using toliet, bedpan, or urinal?: None Help from another person bathing (including washing, rinsing, drying)?: None Help from another person to put on and taking off regular upper body clothing?: None Help from another person to put on and taking off regular lower body clothing?: None 6 Click Score: 20   End of Session Equipment Utilized During Treatment: Rolling walker (2 wheels);Other (comment) (post op shoes) Nurse Communication: Mobility status  Activity Tolerance: Patient tolerated treatment well Patient left: in bed;with call bell/phone within reach                   Time: 0920-0937 OT Time Calculation (min): 17 min Charges:  OT General Charges $OT Visit: 1 Visit OT Evaluation $OT Eval Low Complexity: [place], [place], OTR/L , CBIS ascom [phone]  05/15/21, 12:24 PM
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
## 2021-05-15 NOTE — Discharge Summary (Signed)
Physician Discharge Summary  Charles [name]:[id number] DOB: December 22, 1960 DOA: 05/13/2021  PCP: Lavera Guise, MD  Admit date: 05/13/2021 Discharge date: 05/15/2021  Admitted From: Home Disposition: Home  Recommendations for Outpatient Follow-up:  Follow up with PCP in 1-2 weeks Follow up with Podiatry within 1 wek Please obtain CMP/CBC, Mag, Phos in one week Please follow up on the following pending results: Culture Results and sensitivities intraoperatively  Home Health: No  Equipment/Devices: None    Discharge Condition: Stable  CODE STATUS: FULL CODE   Diet recommendation:   Brief/Interim Summary: The patient is a 61 year old obese Caucasian male with a past medical history significant for but not limited to history of foot ulcer status post medical tarsal trend amputation of the right foot, hypertension, chronic back pain with spinal stimulator as well as other comorbidities who presents with controlled left great toe pain.  Patient had a left great toe the area crushed by a hydraulic dock at work on 5/63/8756 and was scheduled for toe amputation with podiatry on 05/15/2021 but his pain got to the point where he got worse and therefore he presented to the ED.  He has chronic foot ulcers on both his feet however he denied having any diabetes mellitus since his ulcers were from standing on his feet for prolonged periods of time at work.  He denies any fevers and otherwise was noted to have a normal WBC and normal lactic acid level.  CT of the foot showed fracture through the tip of the left great toe distal phalanx.  There is no bone destruction to suggest acute osteomyelitis but there is diffuse edema throughout the subcutaneous soft tissues.  Patient was started on IV vancomycin and Zosyn in the ED and this was continued but then this was changed to IV Cefepime. He underwent left partial hallux amputation as well as right midfoot subcutaneous 100% excisional wound debridement with a 2 x 1 x  0.1 centimeters as well as a left fifth metatarsal phalangeal joint subcutaneous 100% excisional wound debridement, 2 x 2 x 0.1 cm.  Intraoperative cultures revealing gram-positive cocci with sensitivities still pending.  ID has been consulted for further antibiotic recommendations given that podiatry has cleared the patient for discharge and recommending ambulating with weightbearing on the heels and minimize as much as possible..  ID recommends changing antibiotics to p.o. to Augmentin 875 for 10 days as well as Bactrim DS 1 tab p.o. twice daily for 10 days and having podiatry follow-up on the cultures for any antibiotic adjustment as needed.  He was given a wheelchair prior to discharge and was deemed stable for discharge home at this time   Discharge Diagnoses:  Principal Problem:   Cellulitis of left toe  Left Great Toe hallux osteomyelitis with associated Pain  Cellulitis and osteomyelitis and Fracture of the Left Great Toe Foot ulceration of the leg and left associated neuropathic pain -S/p Recent Trauma from Crush Injury  -CT Left Foot w/ Contrast showed "Fracture through the tip of the left great toe distal phalanx as seen by plain film. The bone fragment appears to be near the skin surface at the nail bed with a small amount of adjacent soft tissue gas. No bone destruction to suggest acute osteomyelitis. Diffuse edema throughout the subcutaneous soft tissues." -Podiatry consulted for further evaluation and recommendations -Continued Abx with IV Zosyn and IV Vancomycin but will change IV Zosyn to IV Cefepime and will continue antibiotics with IV and changed to p.o. per ID recommendations -  Pain Control as below; Given IV Morphine 4 mg x1 in the ED -Patient was taken to the OR by Dr. Luana Shu and had a left partial hallux amputation as well as a right midfoot subcutaneous 100% excisional wound debridement as well as a left fifth metatarsal phalangeal joint subcutaneous 100% excisional wound  debridement -Intraoperative cultures were drawn and showing gram-positive cocci with sensitivities pending -PT OT evaluated and recommending no follow-up -Patient's dressings were changed today and he was instructed perform every other day dressing changes and monitoring the ulcerative sites; Dr. Vickki Muff recommended changing the dressings of dry dressing gauze to all wound sites and then cleanse the wound with saline -He was instructed to evaluate for any discoloration to the distal plantar flap of the great toe happens he will need to closely be monitored in outpatient clinic follow-up with Dr. Luana Shu within 1 week -Per orthopedic recommendations he is to ambulate with weightbearing on his heel and minimize as much as possible; he has a wheelchair at home so he can stay completely nonweightbearing and only weight-bear for transfer -Cultures are showing gram-positive cocci with sensitive pending -ID has been consulted for antibiotic recommendations for discharge and they are recommending Bactrim DS 1 tab p.o. twice daily for 10 days as well as Augmentin 875 mg p.o. twice daily for 10 days and have the podiatrist follow-up on the intraoperative cultures -Pain control was given for 2-day course for oxycodone IR 10 mg every 4 as needed; I was not able to verify PDMP given that it would not let me and give me an error message   HTN -C/w Carvedilol 12.5 mg po BID; His home Amlodpine was held on Admission -Continue to Monitor BP per Protocol -Last BP reading was 103/87   Depression and Anxiety -C/w Duloxetine 60 mg po Daily and Buproprion XL 150 mg po Daily   Hypothyroidism -TSH was checked and was 1.583 -C/w Levothyroxine 75 mcg po Daily   HLD -Statin currently held but will resume now   Chronic Back Pain  -Has a Spinal Stimulator -C/w Oxycodone 1-2 tab po q4hprn Severe Pain, Moderate Pain and will resume oxycodone 10 mg every 4 as needed for 12 to 2 days and will need to call his PCP and his  pain specialist for further pain regimen   BPH -C/w Tamsulosin 0.4 mg po Daily    Normocytic Anemia -Patient's Hgb/Hct went from 11.5/36.3 -> 10.9/33.3 -> 10.8/34.1 -Anemia Panel was checked and showed an iron level of 17, U IBC of 256, TIBC of 273, saturation ratios of 6%, ferritin level of 87, folate level 11.8 and vitamin B12 of 220 -We will start p.o. Niferex 150 mg p.o. daily -Continue to Monitor for S/Sx of Bleeding; No overt bleeding noted -Repeat CBC in the AM    Obesity -Complicates overall prognosis and care -Estimated body mass index is 30.34 kg/m as calculated from the following:   Height as of this encounter: 6\' 1"  (1.854 m).   Weight as of this encounter: 104.3 kg.  -Weight Loss and Dietary Counseling given  Discharge Instructions  Discharge Instructions     Call MD for:  difficulty breathing, headache or visual disturbances   Complete by: As directed    Call MD for:  extreme fatigue   Complete by: As directed    Call MD for:  hives   Complete by: As directed    Call MD for:  persistant dizziness or light-headedness   Complete by: As directed    Call MD  for:  persistant nausea and vomiting   Complete by: As directed    Call MD for:  redness, tenderness, or signs of infection (pain, swelling, redness, odor or green/yellow discharge around incision site)   Complete by: As directed    Call MD for:  severe uncontrolled pain   Complete by: As directed    Call MD for:  temperature >100.4   Complete by: As directed    Diet - low sodium heart healthy   Complete by: As directed    Discharge instructions   Complete by: As directed    You were cared for by a hospitalist during your hospital stay. If you have any questions about your discharge medications or the care you received while you were in the hospital after you are discharged, you can call the unit and ask to speak with the hospitalist on call if the hospitalist that took care of you is not available. Once you  are discharged, your primary care physician will handle any further medical issues. Please note that NO REFILLS for any discharge medications will be authorized once you are discharged, as it is imperative that you return to your primary care physician (or establish a relationship with a primary care physician if you do not have one) for your aftercare needs so that they can reassess your need for medications and monitor your lab values.  Follow up with PCP and Podiatry within 1-2 weeks. Take all medications as prescribed. If symptoms change or worsen please return to the ED for evaluation   Discharge wound care:   Complete by: As directed    Per Podiatry Recommendations   Increase activity slowly   Complete by: As directed       Allergies as of 05/15/2021       Reactions   Lisinopril Cough        Medication List     STOP taking these medications    B-12 PO   Biotin 5 MG Caps   cephALEXin 500 MG capsule Commonly known as: KEFLEX   Co Q-10 200 MG Caps   ferrous sulfate 325 (65 FE) MG tablet   Levomilnacipran HCl ER 40 MG Cp24   methocarbamol 500 MG tablet Commonly known as: Robaxin   multivitamin with minerals Tabs tablet   oxyCODONE-acetaminophen 5-325 MG tablet Commonly known as: Percocet   polycarbophil 625 MG tablet Commonly known as: FIBERCON   polyethylene glycol 17 g packet Commonly known as: MIRALAX / GLYCOLAX   SAMBUCUS ELDERBERRY PO   TURMERIC PO   vitamin C 500 MG tablet Commonly known as: ASCORBIC ACID   Vitamin D 125 MCG (5000 UT) Caps       TAKE these medications    albuterol 108 (90 Base) MCG/ACT inhaler Commonly known as: VENTOLIN HFA Inhale 2 puffs into the lungs every 6 (six) hours as needed for wheezing or shortness of breath.   amLODipine 5 MG tablet Commonly known as: NORVASC Take 1 tablet (5 mg total) by mouth daily.   amoxicillin-clavulanate 875-125 MG tablet Commonly known as: AUGMENTIN Take 1 tablet by mouth every 12  (twelve) hours for 10 days.   atorvastatin 40 MG tablet Commonly known as: LIPITOR Take one tab at bed time for cholesterol   buPROPion 150 MG 24 hr tablet Commonly known as: Wellbutrin XL Take 1 tablet (150 mg total) by mouth daily.   busPIRone 30 MG tablet Commonly known as: BUSPAR TAKE ONE TABLET BY MOUTH 3 TIMES DAILY AS NEEDED  carvedilol 12.5 MG tablet Commonly known as: COREG Take 1 tablet (12.5 mg total) by mouth 2 (two) times daily with a meal.   DULoxetine 60 MG capsule Commonly known as: CYMBALTA TAKE 1 CAPSULE BY MOUTH DAILY.   iron polysaccharides 150 MG capsule Commonly known as: NIFEREX Take 1 capsule (150 mg total) by mouth daily.   levothyroxine 75 MCG tablet Commonly known as: SYNTHROID Take 1 tablet (75 mcg total) by mouth daily before breakfast.   lisdexamfetamine 40 MG capsule Commonly known as: Vyvanse Take 1 capsule (40 mg total) by mouth every morning.   naloxegol oxalate 25 MG Tabs tablet Commonly known as: Movantik Take 1 tablet (25 mg total) by mouth daily. What changed:  when to take this reasons to take this   ondansetron 8 MG tablet Commonly known as: ZOFRAN TAKE 1 TABLET BY MOUTH 3 TIMES DAILY AS NEEDED FOR NAUSEA   Oxycodone HCl 10 MG Tabs Take 1 tablet (10 mg total) by mouth every 4 (four) hours as needed for up to 2 days.   sildenafil 20 MG tablet Commonly known as: REVATIO Take 1 tablet (20 mg total) by mouth daily as needed (ED).   sulfamethoxazole-trimethoprim 800-160 MG tablet Commonly known as: BACTRIM DS Take 1 tablet by mouth 2 (two) times daily for 10 days.   tamsulosin 0.4 MG Caps capsule Commonly known as: FLOMAX Take 1 capsule (0.4 mg total) by mouth daily.   Voltaren 1 % Gel Generic drug: diclofenac Sodium Apply 2 g topically 4 (four) times daily as needed.   zolpidem 10 MG tablet Commonly known as: AMBIEN Take 1 tablet (10 mg total) by mouth at bedtime.               Durable Medical Equipment   (From admission, onward)           Start     Ordered   05/15/21 1333  For home use only DME lightweight manual wheelchair with seat cushion  Once       Comments: Patient suffers from Ambulatory Dysfunction which impairs their ability to perform daily activities like bathing, dressing, feeding, grooming, and toileting in the home.  A walker will not resolve  issue with performing activities of daily living. A wheelchair will allow patient to safely perform daily activities. Patient is not able to propel themselves in the home using a standard weight wheelchair due to endurance and general weakness. Patient can self propel in the lightweight wheelchair. Length of need 6 months . Accessories: elevating leg rests (ELRs), wheel locks, extensions and anti-tippers.   05/15/21 1333              Discharge Care Instructions  (From admission, onward)           Start     Ordered   05/15/21 0000  Discharge wound care:       Comments: Per Podiatry Recommendations   05/15/21 1606            Allergies  Allergen Reactions   Lisinopril Cough    Consultations: Podiatry Infectious diseases  Procedures/Studies: CT FOOT LEFT W CONTRAST  Result Date: 05/13/2021 CLINICAL DATA:  Foot swelling, nondiabetic, osteomyelitis suspected. Left great toe injury. EXAM: CT OF THE LOWER LEFT EXTREMITY WITH CONTRAST TECHNIQUE: Multidetector CT imaging of the lower left extremity was performed according to the standard protocol following intravenous contrast administration. RADIATION DOSE REDUCTION: This exam was performed according to the departmental dose-optimization program which includes automated exposure control, adjustment of  the mA and/or kV according to patient size and/or use of iterative reconstruction technique. CONTRAST:  75mL OMNIPAQUE IOHEXOL 300 MG/ML  SOLN COMPARISON:  None. FINDINGS: Bones/Joint/Cartilage Fracture at the tip of the left great toe distal phalanx. The distal fragment is  now displaced and appears to be along the skin surface near the nail bed. Small amount of gas is seen within the underlying soft tissue between the nail bed and the distal phalanx. No visible bone destruction to suggest osteomyelitis. No additional fracture. Degenerative changes in the midfoot and hindfoot. Plantar calcaneal spur. Ligaments Suboptimally assessed by CT. Muscles and Tendons Grossly unremarkable Soft tissues Edema within the subcutaneous soft tissues diffusely. As described above. Small amount of gas in the left great toe soft tissues near the nail bed. No focal fluid collection to suggest abscess. IMPRESSION: Fracture through the tip of the left great toe distal phalanx as seen by plain film. The bone fragment appears to be near the skin surface at the nail bed with a small amount of adjacent soft tissue gas. No bone destruction to suggest acute osteomyelitis. Diffuse edema throughout the subcutaneous soft tissues. Electronically Signed   By: Rolm Baptise M.D.   On: 05/13/2021 18:08   US Venous Img Lower Unilateral Left  Result Date: 05/13/2021 CLINICAL DATA:  LEFT lower extremity swelling. EXAM: LEFT LOWER EXTREMITY VENOUS DOPPLER ULTRASOUND TECHNIQUE: Gray-scale sonography with compression, as well as color and duplex ultrasound, were performed to evaluate the deep venous system(s) from the level of the common femoral vein through the popliteal and proximal calf veins. COMPARISON:  LEFT foot XRs, concurrent. FINDINGS: VENOUS Normal compressibility of the LEFT common femoral, superficial femoral, and popliteal veins, as well as the visualized calf veins. Visualized portions of profunda femoral vein and great saphenous vein unremarkable. No filling defects to suggest DVT on grayscale or color Doppler imaging. Doppler waveforms show normal direction of venous flow, normal respiratory plasticity and response to augmentation. Limited views of the contralateral common femoral vein are unremarkable.  OTHER No evidence of superficial thrombophlebitis or abnormal fluid collection. Distal LEFT lower extremity subcutaneous edema. Limitations: none IMPRESSION: No evidence of femoropopliteal DVT within the LEFT lower extremity. Michaelle Birks, MD [facility] Electronically Signed   By: Michaelle Birks M.D.   On: 05/13/2021 17:08   DG Foot 2 Views Left  Result Date: 05/14/2021 CLINICAL DATA:  Postop EXAM: LEFT FOOT - 2 VIEW COMPARISON:  05/13/2021 FINDINGS: Interval partial amputation first digit at the level of the proximal phalanx. Cut margins are smooth. Bulky plantar calcaneal spur. IMPRESSION: Interval partial resection of first digit at the level of proximal phalanx with expected postsurgical change Electronically Signed   By: Donavan Foil M.D.   On: 05/14/2021 19:26   DG Foot Complete Left  Result Date: 05/13/2021 CLINICAL DATA:  Great toe infection after crushing in hydraulic doc EXAM: LEFT FOOT - COMPLETE 3+ VIEW COMPARISON:  Foot radiographs 05/06/2021 FINDINGS: Again seen is a displaced fracture of the distal tuft of the great toe distal phalanx. The fracture fragment appears more displaced on the current study than on 05/06/2021. Lucency in the surrounding soft tissues may reflect gas. There is no definite osseous destruction or erosion. There is soft tissue swelling over the dorsum of the foot. There is no other acute fracture. Alignment is otherwise normal. The Lisfranc and Chopart joints are intact. There is mild degenerative change about the midfoot and inferior calcaneal spurring. IMPRESSION: Displaced fracture of the  distal tuft of the great toe distal phalanx with increased displacement compared to the study from 05/06/2021. There is surrounding soft tissue swelling with lucency that may reflect soft tissue gas. Infection with gas-forming organism can not be excluded. There is no definite osseous erosion or destruction, though  osteomyelitis is not excluded. Electronically Signed   By: Valetta Mole M.D.   On: 05/13/2021 13:45   DG Foot Complete Left  Result Date: 05/06/2021 CLINICAL DATA:  First toe injury with pain, initial encounter EXAM: LEFT FOOT - COMPLETE 3+ VIEW COMPARISON:  None. FINDINGS: There is a transverse fracture through the distal phalangeal tuft of the first toe. No other fractures are seen. Associated soft tissue swelling is noted related to the recent injury. Calcaneal spurring and tarsal degenerative changes are seen. IMPRESSION: Transverse fracture through the first distal phalanx. Electronically Signed   By: Inez Catalina M.D.   On: 05/06/2021 22:10     Subjective: Seen and examined at bedside and was feeling better and was ambulating in the room when I saw him.  Had no nausea or vomiting.  States his had some pain but says tolerable.  Feels well and ready to go home.  Discharge Exam: Vitals:   05/15/21 0816 05/15/21 1531  BP: 122/71 103/87  Pulse: 74 (!) 55  Resp: 18 18  Temp: 98.4 F (36.9 C) 98.2 F (36.8 C)  SpO2: 96% 91%   Vitals:   05/14/21 2009 05/15/21 0502 05/15/21 0816 05/15/21 1531  BP: 118/64 122/70 122/71 103/87  Pulse: 64 (!) 59 74 (!) 55  Resp: 16 16 18 18   Temp: (!) 97.5 F (36.4 C) 98.6 F (37 C) 98.4 F (36.9 C) 98.2 F (36.8 C)  TempSrc:      SpO2: 96% 98% 96% 91%  Weight:      Height:       General: Pt is alert, awake, not in acute distress Cardiovascular: RRR, S1/S2 +, no rubs, no gallops Respiratory: Diminished bilaterally, no wheezing, no rhonchi; unlabored breathing Abdominal: Soft, NT, distended secondary habitus, bowel sounds + Extremities: no edema, no cyanosis; bilateral lower extremity feet are wrapped he had a transmetatarsal amputation on the right and surgical big toe removal on the left  The results of significant diagnostics from this hospitalization (including imaging, microbiology, ancillary and laboratory) are listed below for reference.     Microbiology: Recent Results (from the past 240 hour(s))  Resp Panel by RT-PCR (Flu A&B, Covid) Nasopharyngeal Swab     Status: None   Collection Time: 05/13/21  4:23 PM   Specimen: Nasopharyngeal Swab; Nasopharyngeal(NP) swabs in vial transport medium  Result Value Ref Range Status   SARS Coronavirus 2 by RT PCR NEGATIVE NEGATIVE Final    Comment: (NOTE) SARS-CoV-2 target nucleic acids are NOT DETECTED.  The SARS-CoV-2 RNA is generally detectable in upper respiratory specimens during the acute phase of infection. The lowest concentration of SARS-CoV-2 viral copies this assay can detect is 138 copies/mL. A negative result does not preclude SARS-Cov-2 infection and should not be used as the sole basis for treatment or other patient management decisions. A negative result may occur with  improper specimen collection/handling, submission of specimen other than nasopharyngeal swab, presence of viral mutation(s) within the areas targeted by this assay, and inadequate number of viral copies(<138 copies/mL). A negative result must be combined with clinical observations, patient history, and epidemiological information. The expected result is Negative.  Fact Sheet for Patients:  EntrepreneurPulse.com.au  Fact Sheet for Healthcare Providers:  IncredibleEmployment.be  This test is no t yet approved or cleared by the [place] and  has been authorized for detection and/or diagnosis of SARS-CoV-2 by FDA under an Emergency Use Authorization (EUA). This EUA will remain  in effect (meaning this test can be used) for the duration of the COVID-19 declaration under Section 564(b)(1) of the Act, 21 U.S.C.section 360bbb-3(b)(1), unless the authorization is terminated  or revoked sooner.       Influenza A by PCR NEGATIVE NEGATIVE Final   Influenza B by PCR NEGATIVE NEGATIVE Final    Comment: (NOTE) The Xpert Xpress SARS-CoV-2/FLU/RSV plus assay is  intended as an aid in the diagnosis of influenza from Nasopharyngeal swab specimens and should not be used as a sole basis for treatment. Nasal washings and aspirates are unacceptable for Xpert Xpress SARS-CoV-2/FLU/RSV testing.  Fact Sheet for Patients: EntrepreneurPulse.com.au  Fact Sheet for Healthcare Providers: IncredibleEmployment.be  This test is not yet approved or cleared by the [place] FDA and has been authorized for detection and/or diagnosis of SARS-CoV-2 by FDA under an Emergency Use Authorization (EUA). This EUA will remain in effect (meaning this test can be used) for the duration of the COVID-19 declaration under Section 564(b)(1) of the Act, 21 U.S.C. section 360bbb-3(b)(1), unless the authorization is terminated or revoked.  Performed at [facility], [address] [place], [place] [address]   Culture, blood (routine x 2)     Status: None (Preliminary result)   Collection Time: 05/13/21  4:23 PM   Specimen: BLOOD  Result Value Ref Range Status   Specimen Description BLOOD BLOOD RIGHT ARM  Final   Special Requests BOTTLES DRAWN AEROBIC AND ANAEROBIC BCAV  Final   Culture   Final    NO GROWTH 2 DAYS Performed at [facility], [address] [place], [place] [address]    Report Status PENDING  Incomplete  Culture, blood (routine x 2)     Status: None (Preliminary result)   Collection Time: 05/13/21  4:23 PM   Specimen: BLOOD  Result Value Ref Range Status   Specimen Description BLOOD BLOOD LEFT ARM  Final   Special Requests BOTTLES DRAWN AEROBIC AND ANAEROBIC BCLV  Final   Culture   Final    NO GROWTH 2 DAYS Performed at [facility], [address], [place], [place] [address]    Report Status PENDING  Incomplete  Surgical PCR screen     Status: Abnormal   Collection Time: 05/13/21 10:33 PM   Specimen: Nasal Mucosa; Nasal Swab  Result Value Ref Range Status   MRSA, PCR POSITIVE  (A) NEGATIVE Final    Comment: RESULT CALLED TO, READ BACK BY AND VERIFIED WITH: MARSHELL TURNER @ 0013 ON 05/14/2021.Marland KitchenMarland KitchenTKR    Staphylococcus aureus POSITIVE (A) NEGATIVE Final    Comment: RESULT CALLED TO, READ BACK BY AND VERIFIED WITH: MARSHELL TURNER @ 0013 ON 05/14/2021.Marland KitchenMarland KitchenTKR (NOTE) The Xpert SA Assay (FDA approved for NASAL specimens in patients 16 years of age and older), is one component of a comprehensive surveillance program. It is not intended to diagnose infection nor to guide or monitor treatment. Performed at [facility], [address] [place], [place] [address]   Aerobic/Anaerobic Culture w Gram Stain (surgical/deep wound)     Status: None (Preliminary result)   Collection Time: 05/14/21  6:00 PM   Specimen: PATH Other; Tissue  Result Value Ref Range Status   Specimen Description   Final    WOUND Performed at [facility]  [name], [address] [place], [place] [address]    Special Requests   Final    NONE Performed at [facility], [place], [place] [address]    Gram Stain   Final    NO SQUAMOUS EPITHELIAL CELLS SEEN FEW WBC SEEN MODERATE GRAM POSITIVE COCCI    Culture   Final    TOO YOUNG TO READ Performed at [facility], [place] [address], [place], [place] [address]    Report Status PENDING  Incomplete    Labs: BNP (last 3 results) No results for input(s): BNP in the last 8760 hours. Basic Metabolic Panel: Recent Labs  Lab 05/13/21 1250 05/14/21 0606 05/15/21 0452  NA 133* 137 137  K 4.5 4.3 4.3  CL 102 104 103  CO2 24 27 27   GLUCOSE 116* 111* 91  BUN 26* 14 14  CREATININE 1.06 0.95 0.82  CALCIUM 8.7* 8.5* 8.5*  MG  --  2.0 2.1  PHOS  --   --  3.5   Liver Function Tests: Recent Labs  Lab 05/13/21 1250 05/15/21 0452  AST 22 19  ALT 24 17  ALKPHOS 109 95  BILITOT 0.3 0.3  PROT 7.5 6.4*  ALBUMIN 3.4* 2.8*   No results for input(s): LIPASE, AMYLASE in the last 168 hours. No results for  input(s): AMMONIA in the last 168 hours. CBC: Recent Labs  Lab 05/13/21 1250 05/14/21 0606 05/15/21 0452  WBC 9.9 8.1 7.0  NEUTROABS 7.2  --  4.0  HGB 11.5* 10.9* 10.8*  HCT 36.3* 33.3* 34.1*  MCV 88.3 86.7 87.4  PLT 369 337 338   Cardiac Enzymes: No results for input(s): CKTOTAL, CKMB, CKMBINDEX, TROPONINI in the last 168 hours. BNP: Invalid input(s): POCBNP CBG: No results for input(s): GLUCAP in the last 168 hours. D-Dimer No results for input(s): DDIMER in the last 72 hours. Hgb A1c Recent Labs    05/13/21 1250  HGBA1C 5.7*   Lipid Profile No results for input(s): CHOL, HDL, LDLCALC, TRIG, CHOLHDL, LDLDIRECT in the last 72 hours. Thyroid function studies Recent Labs    05/15/21 0452  TSH 1.583   Anemia work up Recent Labs    05/15/21 0452  VITAMINB12 220  FOLATE 11.8  FERRITIN 87  TIBC 273  IRON 17*  RETICCTPCT 0.8   Urinalysis    Component Value Date/Time   COLORURINE YELLOW 05/13/2021 2252   APPEARANCEUR CLEAR 05/13/2021 2252   APPEARANCEUR Cloudy (A) 11/21/2020 1106   LABSPEC 1.020 05/13/2021 2252   LABSPEC 1.004 01/27/2013 2020   PHURINE 5.0 05/13/2021 2252   GLUCOSEU NEGATIVE 05/13/2021 2252   GLUCOSEU Negative 01/27/2013 2020   HGBUR NEGATIVE 05/13/2021 2252   BILIRUBINUR NEGATIVE 05/13/2021 2252   BILIRUBINUR Negative 11/21/2020 1106   BILIRUBINUR Negative 01/27/2013 2020   KETONESUR NEGATIVE 05/13/2021 2252   PROTEINUR NEGATIVE 05/13/2021 2252   NITRITE NEGATIVE 05/13/2021 2252   LEUKOCYTESUR NEGATIVE 05/13/2021 2252   LEUKOCYTESUR Negative 01/27/2013 2020   Sepsis Labs Invalid input(s): PROCALCITONIN,  WBC,  LACTICIDVEN Microbiology Recent Results (from the past 240 hour(s))  Resp Panel by RT-PCR (Flu A&B, Covid) Nasopharyngeal Swab     Status: None   Collection Time: 05/13/21  4:23 PM   Specimen: Nasopharyngeal Swab; Nasopharyngeal(NP) swabs in vial transport medium  Result Value Ref Range Status   SARS Coronavirus 2 by RT PCR  NEGATIVE NEGATIVE Final    Comment: (NOTE) SARS-CoV-2 target nucleic acids are NOT DETECTED.  The SARS-CoV-2 RNA is  generally detectable in upper respiratory specimens during the acute phase of infection. The lowest concentration of SARS-CoV-2 viral copies this assay can detect is 138 copies/mL. A negative result does not preclude SARS-Cov-2 infection and should not be used as the sole basis for treatment or other patient management decisions. A negative result may occur with  improper specimen collection/handling, submission of specimen other than nasopharyngeal swab, presence of viral mutation(s) within the areas targeted by this assay, and inadequate number of viral copies(<138 copies/mL). A negative result must be combined with clinical observations, patient history, and epidemiological information. The expected result is Negative.  Fact Sheet for Patients:  EntrepreneurPulse.com.au  Fact Sheet for Healthcare Providers:  IncredibleEmployment.be  This test is no t yet approved or cleared by the [place] FDA and  has been authorized for detection and/or diagnosis of SARS-CoV-2 by FDA under an Emergency Use Authorization (EUA). This EUA will remain  in effect (meaning this test can be used) for the duration of the COVID-19 declaration under Section 564(b)(1) of the Act, 21 U.S.C.section 360bbb-3(b)(1), unless the authorization is terminated  or revoked sooner.       Influenza A by PCR NEGATIVE NEGATIVE Final   Influenza B by PCR NEGATIVE NEGATIVE Final    Comment: (NOTE) The Xpert Xpress SARS-CoV-2/FLU/RSV plus assay is intended as an aid in the diagnosis of influenza from Nasopharyngeal swab specimens and should not be used as a sole basis for treatment. Nasal washings and aspirates are unacceptable for Xpert Xpress SARS-CoV-2/FLU/RSV testing.  Fact Sheet for Patients: EntrepreneurPulse.com.au  Fact Sheet for  Healthcare Providers: IncredibleEmployment.be  This test is not yet approved or cleared by the [place] FDA and has been authorized for detection and/or diagnosis of SARS-CoV-2 by FDA under an Emergency Use Authorization (EUA). This EUA will remain in effect (meaning this test can be used) for the duration of the COVID-19 declaration under Section 564(b)(1) of the Act, 21 U.S.C. section 360bbb-3(b)(1), unless the authorization is terminated or revoked.  Performed at [facility], [address] [place], [place] [address]   Culture, blood (routine x 2)     Status: None (Preliminary result)   Collection Time: 05/13/21  4:23 PM   Specimen: BLOOD  Result Value Ref Range Status   Specimen Description BLOOD BLOOD RIGHT ARM  Final   Special Requests BOTTLES DRAWN AEROBIC AND ANAEROBIC BCAV  Final   Culture   Final    NO GROWTH 2 DAYS Performed at [facility], [address], [place], [place] [address]    Report Status PENDING  Incomplete  Culture, blood (routine x 2)     Status: None (Preliminary result)   Collection Time: 05/13/21  4:23 PM   Specimen: BLOOD  Result Value Ref Range Status   Specimen Description BLOOD BLOOD LEFT ARM  Final   Special Requests BOTTLES DRAWN AEROBIC AND ANAEROBIC BCLV  Final   Culture   Final    NO GROWTH 2 DAYS Performed at [facility], [address] [place], [place] [address]    Report Status PENDING  Incomplete  Surgical PCR screen     Status: Abnormal   Collection Time: 05/13/21 10:33 PM   Specimen: Nasal Mucosa; Nasal Swab  Result Value Ref Range Status   MRSA, PCR POSITIVE (A) NEGATIVE Final    Comment: RESULT CALLED TO, READ BACK BY AND VERIFIED WITH: MARSHELL TURNER @ 0013 ON 05/14/2021.Marland KitchenMarland KitchenTKR    Staphylococcus aureus POSITIVE (A) NEGATIVE Final    Comment:  RESULT CALLED TO, READ BACK BY AND VERIFIED WITH: MARSHELL TURNER @ 0013 ON 05/14/2021.Marland KitchenMarland KitchenTKR (NOTE) The Xpert SA Assay (FDA  approved for NASAL specimens in patients 61 years of age and older), is one component of a comprehensive surveillance program. It is not intended to diagnose infection nor to guide or monitor treatment. Performed at [facility], [address], [place], [place] [address]   Aerobic/Anaerobic Culture w Gram Stain (surgical/deep wound)     Status: None (Preliminary result)   Collection Time: 05/14/21  6:00 PM   Specimen: PATH Other; Tissue  Result Value Ref Range Status   Specimen Description   Final    WOUND Performed at [facility], [address], [place], [place] [address]    Special Requests   Final    NONE Performed at [facility], [address] [place], [place] [address]    Gram Stain   Final    NO SQUAMOUS EPITHELIAL CELLS SEEN FEW WBC SEEN MODERATE GRAM POSITIVE COCCI    Culture   Final    TOO YOUNG TO READ Performed at [facility], [place] [address] [place], [place] [address]    Report Status PENDING  Incomplete   Time coordinating discharge: 35 minutes  SIGNED:  Kerney Elbe, DO [facility] 05/15/2021, 7:50 PM Pager is on [place]  If 7PM-7AM, please contact night-coverage [email or web address]

## 2021-05-15 NOTE — Evaluation (Signed)
Physical Therapy Evaluation Patient Details Name: Charles [name] MRN: [id number] DOB: 12-Dec-1960 Today's Date: 05/15/2021  History of Present Illness  Charles [name] is a 61 y.o. male with medical history significant of foot ulcers s/p transmetatarsal amputation right foot, HTN, chronic back pain with spine stimulator who presented with uncontrolled left great toe pain. Pt's left great toe got crushed by hydraulic dock at work on 8/56/31.  Pt is scheduled for toe amputation with podiatry on 05/15/21, however, pt has worsening controlled pain in his toe and therefore presented to the ED.  Pt also has chronic foot ulcers on both feet, however, pt denied having DM, and said ulcers were from standing on his feet for prolonged periods of time at work.  No fever.  ROS otherwise neg.   Clinical Impression  Patient received in bed, he is agreeable to PT assessment. Patient is independent with bed mobility and transfers. He ambulated to bathroom with RW and back to bed without AD. B post op shoes donned independently. He does not require skilled PT at this time. Educated patient about heel weight bearing and staying off feet as much as possible for wound healing. Educated patient that he will benefit from wheelchair at discharge, he is in agreement.          Recommendations for follow up therapy are one component of a multi-disciplinary discharge planning process, led by the attending physician.  Recommendations may be updated based on patient status, additional functional criteria and insurance authorization.  Follow Up Recommendations No PT follow up    Assistance Recommended at Discharge Intermittent Supervision/Assistance  Patient can return home with the following       Equipment Recommendations Wheelchair cushion (measurements PT);Wheelchair (measurements PT)  Recommendations for Other Services       Functional Status Assessment Patient has not had a recent decline in their functional status      Precautions / Restrictions Precautions Precautions: Fall Restrictions Weight Bearing Restrictions: No      Mobility  Bed Mobility Overal bed mobility: Independent                  Transfers Overall transfer level: Independent Equipment used: Rolling walker (2 wheels), None                    Ambulation/Gait Ambulation/Gait assistance: Modified independent (Device/Increase time) Gait Distance (Feet): 25 Feet Assistive device: Rolling walker (2 wheels), None Gait Pattern/deviations: Step-through pattern, WFL(Within Functional Limits) Gait velocity: WFL     General Gait Details: patient is able to ambulate with RW and without in room. No assistance needed or difficulties noted other than reported pain in B feet.  Stairs            Wheelchair Mobility    Modified Rankin (Stroke Patients Only)       Balance Overall balance assessment: Independent                                           Pertinent Vitals/Pain Pain Assessment Pain Assessment: Faces Faces Pain Scale: Hurts a little bit Pain Location: B feet Pain Descriptors / Indicators: Discomfort, Sore Pain Intervention(s): Limited activity within patient's tolerance, Monitored during session    Home Living Family/patient expects to be discharged to:: Private residence Living Arrangements: Spouse/significant other Available Help at Discharge: Family;Available PRN/intermittently Type of Home: House Home Access: Stairs to  enter Entrance Stairs-Rails: None Entrance Stairs-Number of Steps: 2   Home Layout: One level Home Equipment: Conservation officer, nature (2 wheels);Cane - single point      Prior Function Prior Level of Function : Independent/Modified Independent             Mobility Comments: patient was independent prior to admission, working, driving. ADLs Comments: independent     Hand Dominance        Extremity/Trunk Assessment   Upper Extremity  Assessment Upper Extremity Assessment: Overall WFL for tasks assessed    Lower Extremity Assessment Lower Extremity Assessment: Overall WFL for tasks assessed    Cervical / Trunk Assessment Cervical / Trunk Assessment: Normal  Communication   Communication: No difficulties  Cognition Arousal/Alertness: Awake/alert Behavior During Therapy: WFL for tasks assessed/performed Overall Cognitive Status: Within Functional Limits for tasks assessed                                          General Comments      Exercises     Assessment/Plan    PT Assessment Patient does not need any further PT services  PT Problem List         PT Treatment Interventions      PT Goals (Current goals can be found in the Care Plan section)  Acute Rehab PT Goals Patient Stated Goal: to return home, get wounds healed PT Goal Formulation: With patient Time For Goal Achievement: 05/22/21 Potential to Achieve Goals: Good    Frequency       Co-evaluation               AM-PAC PT "6 Clicks" Mobility  Outcome Measure Help needed turning from your back to your side while in a flat bed without using bedrails?: None Help needed moving from lying on your back to sitting on the side of a flat bed without using bedrails?: None Help needed moving to and from a bed to a chair (including a wheelchair)?: None Help needed standing up from a chair using your arms (e.g., wheelchair or bedside chair)?: None Help needed to walk in hospital room?: None Help needed climbing 3-5 steps with a railing? : None 6 Click Score: 24    End of Session Equipment Utilized During Treatment: Other (comment) (B post op shoes) Activity Tolerance: Patient tolerated treatment well Patient left: in bed;with call bell/phone within reach Nurse Communication: Mobility status      Time: 6712-4580 PT Time Calculation (min) (ACUTE ONLY): 17 min   Charges:   PT Evaluation $PT Eval Low Complexity: 1 Low           Rasheeda [place], PT, GCS 05/15/21,10:30 AM

## 2021-05-15 NOTE — TOC Transition Note (Signed)
Transition of Care [facility]) - CM/SW Discharge Note   Patient Details  Name: Charles [name] MRN: [id number] Date of Birth: [birth year]  [facility] CM/SW Contact:  Candie Chroman, LCSW Phone Number: 05/15/2021, 4:30 PM   Clinical Narrative:  Patient has orders to discharge home today. Ordered wheelchair through [place]. No further concerns. CSW signing off.   Final next level of care: Home/Self Care Barriers to Discharge: No Barriers Identified   Patient Goals and CMS Choice        Discharge Placement                    Patient and family notified of of transfer: 05/15/21  Discharge Plan and Services                DME Arranged: Youth worker wheelchair with seat cushion DME Agency: [employer] Date DME Agency Contacted: 05/15/21   Representative spoke with at DME Agency: [place] Determinants of Health ([place]) Interventions     Readmission Risk Interventions No flowsheet data found.

## 2021-05-15 NOTE — Progress Notes (Signed)
Daily Progress Note   Subjective  - 1 Day Post-Op  Left great toe amputation and debridement of ulcers left and right foot.  Objective Vitals:   05/14/21 1855 05/14/21 2009 05/15/21 0502 05/15/21 0816  BP:  118/64 122/70 122/71  Pulse: 65 64 (!) 59 74  Resp: 20 16 16 18   Temp: (!) 97.5 F (36.4 C) (!) 97.5 F (36.4 C) 98.6 F (37 C) 98.4 F (36.9 C)  TempSrc:      SpO2: 99% 96% 98% 96%  Weight:      Height:        Physical Exam: The incision site is coapted to the amputation site.  Dorsally good perfusion of the skin flap.  The distal plantar aspect of the skin flap has a little bit of white discoloration.  Cool.  Not painful.  Can see clinical picture.  The ulcerative sites have a nice healthy granular tissue base.       Laboratory CBC    Component Value Date/Time   WBC 7.0 05/15/2021 0452   HGB 10.8 (L) 05/15/2021 0452   HGB 13.1 04/15/2020 1308   HCT 34.1 (L) 05/15/2021 0452   HCT 38.7 04/15/2020 1308   PLT 338 05/15/2021 0452   PLT 216 04/15/2020 1308    BMET    Component Value Date/Time   NA 137 05/15/2021 0452   NA 138 04/15/2020 1308   NA 140 01/27/2013 2020   K 4.3 05/15/2021 0452   K 3.7 01/27/2013 2020   CL 103 05/15/2021 0452   CL 107 01/27/2013 2020   CO2 27 05/15/2021 0452   CO2 26 01/27/2013 2020   GLUCOSE 91 05/15/2021 0452   GLUCOSE 85 01/27/2013 2020   BUN 14 05/15/2021 0452   BUN 18 04/15/2020 1308   BUN 16 01/27/2013 2020   CREATININE 0.82 05/15/2021 0452   CREATININE 0.98 01/27/2013 2020   CALCIUM 8.5 (L) 05/15/2021 0452   CALCIUM 8.6 01/27/2013 2020   GFRNONAA >60 05/15/2021 0452   GFRNONAA >60 01/27/2013 2020   GFRAA 97 04/15/2020 1308   GFRAA >60 01/27/2013 2020   Cultures revealing gram-positive cocci.  Sensitivities pending  Assessment/Planning: Status post amputation left great toe and debridement of ulcers left foot  Per operative note this appears to be a therapeutic amputation.  He recommended oral antibiotics  upon discharge. Dressings were changed today.  I discussed with the patient he can perform every other day dressing changes to closely monitor the ulcerative sites.  Dressing changes would consist of Dry gauze dressing to all wound sites.  Can cleanse the wound with saline. I did relate the discoloration of the distal plantar flap of the great toe will need to closely be monitored in the outpatient clinic.  Patient follow-up with Dr. Luana Shu in 1 week. From podiatry standpoint patient stable for discharge.  Ambulate with weight-bear on the heels and minimize as much as possible.  Patient states he has a wheelchair that he can basically stay completely nonweightbearing and only weight-bear for transfer which is recommended at this time.  Samara Deist A  05/15/2021, 12:16 PM

## 2021-05-15 NOTE — TOC CM/SW Note (Signed)
°  °  Durable Medical Equipment  (From admission, onward)           Start     Ordered   05/15/21 1333  For home use only DME lightweight manual wheelchair with seat cushion  Once       Comments: Patient suffers from Ambulatory Dysfunction which impairs their ability to perform daily activities like bathing, dressing, feeding, grooming, and toileting in the home.  A walker will not resolve  issue with performing activities of daily living. A wheelchair will allow patient to safely perform daily activities. Patient is not able to propel themselves in the home using a standard weight wheelchair due to endurance and general weakness. Patient can self propel in the lightweight wheelchair. Length of need 6 months . Accessories: elevating leg rests (ELRs), wheel locks, extensions and anti-tippers.   05/15/21 1333

## 2021-05-16 LAB — SURGICAL PATHOLOGY

## 2021-05-18 LAB — CULTURE, BLOOD (ROUTINE X 2)
Culture: NO GROWTH
Culture: NO GROWTH

## 2021-05-19 LAB — AEROBIC/ANAEROBIC CULTURE W GRAM STAIN (SURGICAL/DEEP WOUND): Gram Stain: NONE SEEN

## 2021-05-22 ENCOUNTER — Ambulatory Visit: Payer: 59 | Admitting: Urology

## 2021-05-29 ENCOUNTER — Other Ambulatory Visit: Payer: Self-pay | Admitting: Internal Medicine

## 2021-05-29 DIAGNOSIS — F331 Major depressive disorder, recurrent, moderate: Secondary | ICD-10-CM

## 2021-05-29 DIAGNOSIS — F329 Major depressive disorder, single episode, unspecified: Secondary | ICD-10-CM

## 2021-05-29 DIAGNOSIS — F411 Generalized anxiety disorder: Secondary | ICD-10-CM

## 2021-05-29 MED ORDER — BUPROPION HCL ER (XL) 150 MG PO TB24: 150.0000 mg | ORAL_TABLET | Freq: Every day | ORAL | 1 refills | Status: DC

## 2021-06-07 ENCOUNTER — Other Ambulatory Visit: Payer: Self-pay | Admitting: Physician Assistant

## 2021-06-07 ENCOUNTER — Telehealth: Payer: Self-pay

## 2021-06-07 DIAGNOSIS — G4719 Other hypersomnia: Secondary | ICD-10-CM

## 2021-06-07 MED ORDER — LISDEXAMFETAMINE DIMESYLATE 40 MG PO CAPS: 40.0000 mg | ORAL_CAPSULE | Freq: Every morning | ORAL | 0 refills | Status: DC

## 2021-06-07 NOTE — Telephone Encounter (Signed)
Pt lmom requesting a refill of his vyvance that he ran out today 06/07/21.  I informed Lauren and also tried to call patient back twice and left message.  Advised pt in message that he needs to let us know in advance during the work week if he needs refills and also at least a week ahead to give time to get refills done.  Per Ander Purpura she sent rx to pharmacy this time

## 2021-06-09 ENCOUNTER — Other Ambulatory Visit: Payer: Self-pay | Admitting: Physician Assistant

## 2021-06-09 ENCOUNTER — Ambulatory Visit: Payer: Managed Care, Other (non HMO) | Admitting: Urology

## 2021-06-09 DIAGNOSIS — G4719 Other hypersomnia: Secondary | ICD-10-CM

## 2021-06-10 ENCOUNTER — Ambulatory Visit
Admission: RE | Admit: 2021-06-10 | Discharge: 2021-06-10 | Disposition: A | Discharge status: Home / Self Care | Payer: Managed Care, Other (non HMO) | Source: Ambulatory Visit | Attending: Urology | Admitting: Urology

## 2021-06-10 ENCOUNTER — Ambulatory Visit
Admission: RE | Admit: 2021-06-10 | Discharge: 2021-06-10 | Disposition: A | Discharge status: Home / Self Care | Payer: Managed Care, Other (non HMO) | Attending: Urology | Admitting: Urology

## 2021-06-10 DIAGNOSIS — N200 Calculus of kidney: Secondary | ICD-10-CM | POA: Diagnosis present

## 2021-06-13 ENCOUNTER — Other Ambulatory Visit: Payer: Self-pay | Admitting: Podiatry

## 2021-06-17 ENCOUNTER — Other Ambulatory Visit
Admission: RE | Admit: 2021-06-17 | Discharge: 2021-06-17 | Disposition: A | Discharge status: Home / Self Care | Payer: Managed Care, Other (non HMO) | Source: Ambulatory Visit | Attending: Podiatry | Admitting: Podiatry

## 2021-06-17 ENCOUNTER — Other Ambulatory Visit: Payer: Self-pay

## 2021-06-17 NOTE — Patient Instructions (Addendum)
Your procedure is scheduled on: Thursday June 19, 2021. Report to Day Surgery inside [facility] 2nd floor, stop by admissions desk before getting on elevator. To find out your arrival time please call [phone] between 1PM - 3PM on Wednesday June 18, 2021.  Remember: Instructions that are not followed completely may result in serious medical risk,  up to and including death, or upon the discretion of your surgeon and anesthesiologist your  surgery may need to be rescheduled.     _X__ 1. Do not eat food after midnight the night before your procedure.                 No chewing gum or hard candies. You may drink clear liquids up to 2 hours                 before you are scheduled to arrive for your surgery- DO not drink clear                 liquids within 2 hours of the start of your surgery.                 Clear Liquids include:  water, Black Coffee or Tea (Do not add                 anything to coffee or tea).  __X__2.  On the morning of surgery brush your teeth with toothpaste and water, you                may rinse your mouth with mouthwash if you wish.  Do not swallow any toothpaste or mouthwash.     _X__ 3.  No Alcohol for 24 hours before or after surgery.   _X__ 4.  Do Not Smoke or use e-cigarettes For 24 Hours Prior to Your Surgery.                 Do not use any chewable tobacco products for at least 6 hours prior to                 Surgery.  _X__  5.  Do not use any recreational drugs (marijuana, cocaine, heroin, ecstasy, MDMA or other)                For at least one week prior to your surgery.  Combination of these drugs with anesthesia                May have life threatening results.  ____  6.  Bring all medications with you on the day of surgery if instructed.   __X__  7.  Notify your doctor if there is any change in your medical condition      (cold, fever, infections).     Do not wear jewelry, make-up, hairpins, clips or nail polish. Do  not wear lotions, powders, or perfumes. You may wear deodorant. Do not shave 48 hours prior to surgery. Men may shave face and neck. Do not bring valuables to the hospital.    [facility] is not responsible for any belongings or valuables.  Contacts, dentures or bridgework may not be worn into surgery. Leave your suitcase in the car. After surgery it may be brought to your room. For patients admitted to the hospital, discharge time is determined by your treatment team.   Patients discharged the day of surgery will not be allowed to drive home.   Make arrangements for someone to be with you  for the first 24 hours of your Same Day Discharge.   __X__ Take these medicines the morning of surgery with A SIP OF WATER:    1. amLODipine (NORVASC) 5 MG   2. buPROPion (WELLBUTRIN XL) 150 MG  3. busPIRone (BUSPAR) 30 MG  4. carvedilol (COREG) 12.5 MG  5. DULoxetine (CYMBALTA) 60 MG  6. levothyroxine (SYNTHROID) 75 MCG  7. Oxycodone HCl 10 MG   8. tamsulosin (FLOMAX) 0.4 MG  ____ Fleet Enema (as directed)   ____ Use CHG Soap (or wipes) as directed  ____ Use Benzoyl Peroxide Gel as instructed  __X__ Use inhalers on the day of surgery  albuterol (PROVENTIL HFA;VENTOLIN HFA) 108 (90 Base) MCG/ACT inhaler  ____ Stop metformin 2 days prior to surgery    ____ Take 1/2 of usual insulin dose the night before surgery. No insulin the morning          of surgery.   ____ Call your PCP, cardiologist, or Pulmonologist if taking Coumadin/Plavix/aspirin and ask when to stop before your surgery.   __X__ One Week prior to surgery- Stop Anti-inflammatories such as Ibuprofen, Aleve, Advil, Motrin, meloxicam (MOBIC), diclofenac, etodolac, ketorolac, Toradol, Daypro, piroxicam, Goody's or BC powders. OK TO USE TYLENOL IF NEEDED   __X__ Stop supplements until after surgery.    ____ Bring C-Pap to the hospital.    If you have any questions regarding your pre-procedure instructions,  Please call  Pre-admit Testing at [phone]
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
## 2021-06-18 MED ORDER — CEFAZOLIN SODIUM-DEXTROSE 2-4 GM/100ML-% IV SOLN
2.0000 g | INTRAVENOUS | Status: AC
  Administered 2021-06-19: 2 g via INTRAVENOUS

## 2021-06-18 MED ORDER — CHLORHEXIDINE GLUCONATE 0.12 % MT SOLN: 15.0000 mL | Freq: Once | OROMUCOSAL | Status: AC

## 2021-06-18 MED ORDER — FAMOTIDINE 20 MG PO TABS
20.0000 mg | ORAL_TABLET | Freq: Once | ORAL | Status: AC
Start: 2021-06-18 — End: 2021-06-19

## 2021-06-18 MED ORDER — SODIUM CHLORIDE 0.9 % IV SOLN: INTRAVENOUS | Status: DC

## 2021-06-18 MED ORDER — ORAL CARE MOUTH RINSE: 15.0000 mL | Freq: Once | OROMUCOSAL | Status: AC

## 2021-06-19 ENCOUNTER — Ambulatory Visit: Payer: Managed Care, Other (non HMO) | Admitting: Urology

## 2021-06-19 ENCOUNTER — Ambulatory Visit: Payer: No Typology Code available for payment source | Admitting: Urgent Care

## 2021-06-19 ENCOUNTER — Ambulatory Visit
Admission: RE | Admit: 2021-06-19 | Discharge: 2021-06-19 | Disposition: A | Discharge status: Home / Self Care | Payer: No Typology Code available for payment source | Attending: Podiatry | Admitting: Podiatry

## 2021-06-19 ENCOUNTER — Other Ambulatory Visit: Payer: Self-pay

## 2021-06-19 ENCOUNTER — Encounter: Payer: Self-pay | Admitting: Podiatry

## 2021-06-19 ENCOUNTER — Encounter
Admission: RE | Disposition: A | Discharge status: Home / Self Care | Payer: Self-pay | Source: Home / Self Care | Attending: Podiatry

## 2021-06-19 DIAGNOSIS — Z9884 Bariatric surgery status: Secondary | ICD-10-CM | POA: Insufficient documentation

## 2021-06-19 DIAGNOSIS — G8929 Other chronic pain: Secondary | ICD-10-CM | POA: Diagnosis not present

## 2021-06-19 DIAGNOSIS — E669 Obesity, unspecified: Secondary | ICD-10-CM | POA: Diagnosis not present

## 2021-06-19 DIAGNOSIS — G4730 Sleep apnea, unspecified: Secondary | ICD-10-CM | POA: Insufficient documentation

## 2021-06-19 DIAGNOSIS — M109 Gout, unspecified: Secondary | ICD-10-CM | POA: Diagnosis not present

## 2021-06-19 DIAGNOSIS — E11621 Type 2 diabetes mellitus with foot ulcer: Secondary | ICD-10-CM | POA: Insufficient documentation

## 2021-06-19 DIAGNOSIS — J45909 Unspecified asthma, uncomplicated: Secondary | ICD-10-CM | POA: Insufficient documentation

## 2021-06-19 DIAGNOSIS — K219 Gastro-esophageal reflux disease without esophagitis: Secondary | ICD-10-CM | POA: Insufficient documentation

## 2021-06-19 DIAGNOSIS — L97524 Non-pressure chronic ulcer of other part of left foot with necrosis of bone: Secondary | ICD-10-CM | POA: Insufficient documentation

## 2021-06-19 DIAGNOSIS — I10 Essential (primary) hypertension: Secondary | ICD-10-CM | POA: Insufficient documentation

## 2021-06-19 DIAGNOSIS — L97412 Non-pressure chronic ulcer of right heel and midfoot with fat layer exposed: Secondary | ICD-10-CM | POA: Diagnosis not present

## 2021-06-19 DIAGNOSIS — I252 Old myocardial infarction: Secondary | ICD-10-CM | POA: Diagnosis not present

## 2021-06-19 DIAGNOSIS — E039 Hypothyroidism, unspecified: Secondary | ICD-10-CM | POA: Diagnosis not present

## 2021-06-19 DIAGNOSIS — Z955 Presence of coronary angioplasty implant and graft: Secondary | ICD-10-CM | POA: Insufficient documentation

## 2021-06-19 DIAGNOSIS — D649 Anemia, unspecified: Secondary | ICD-10-CM | POA: Diagnosis not present

## 2021-06-19 DIAGNOSIS — T8781 Dehiscence of amputation stump: Secondary | ICD-10-CM | POA: Insufficient documentation

## 2021-06-19 DIAGNOSIS — Z6828 Body mass index (BMI) 28.0-28.9, adult: Secondary | ICD-10-CM | POA: Diagnosis not present

## 2021-06-19 DIAGNOSIS — M86172 Other acute osteomyelitis, left ankle and foot: Secondary | ICD-10-CM

## 2021-06-19 DIAGNOSIS — E1140 Type 2 diabetes mellitus with diabetic neuropathy, unspecified: Secondary | ICD-10-CM | POA: Insufficient documentation

## 2021-06-19 DIAGNOSIS — T8130XA Disruption of wound, unspecified, initial encounter: Secondary | ICD-10-CM | POA: Diagnosis present

## 2021-06-19 DIAGNOSIS — M1990 Unspecified osteoarthritis, unspecified site: Secondary | ICD-10-CM | POA: Insufficient documentation

## 2021-06-19 DIAGNOSIS — I2510 Atherosclerotic heart disease of native coronary artery without angina pectoris: Secondary | ICD-10-CM | POA: Insufficient documentation

## 2021-06-19 HISTORY — PX: WOUND DEBRIDEMENT: SHX247

## 2021-06-19 HISTORY — PX: AMPUTATION TOE: SHX6595

## 2021-06-19 LAB — GLUCOSE, CAPILLARY: Glucose-Capillary: 92 mg/dL (ref 70–99)

## 2021-06-19 SURGERY — AMPUTATION, TOE
Anesthesia: General | Site: Toe | Laterality: Left

## 2021-06-19 MED ORDER — BUPIVACAINE HCL 0.5 % IJ SOLN
INTRAMUSCULAR | Status: DC | PRN
  Administered 2021-06-19: 20 mL

## 2021-06-19 MED ORDER — DEXAMETHASONE SODIUM PHOSPHATE 10 MG/ML IJ SOLN
INTRAMUSCULAR | Status: DC | PRN
  Administered 2021-06-19: 5 mg via INTRAVENOUS

## 2021-06-19 MED ORDER — KETAMINE HCL 10 MG/ML IJ SOLN
INTRAMUSCULAR | Status: DC | PRN
  Administered 2021-06-19: 30 mg via INTRAVENOUS

## 2021-06-19 MED ORDER — CEFAZOLIN SODIUM-DEXTROSE 2-4 GM/100ML-% IV SOLN
INTRAVENOUS | Status: AC
  Filled 2021-06-19: qty 100

## 2021-06-19 MED ORDER — PROPOFOL 10 MG/ML IV BOLUS
INTRAVENOUS | Status: AC
  Filled 2021-06-19: qty 20

## 2021-06-19 MED ORDER — FENTANYL CITRATE (PF) 100 MCG/2ML IJ SOLN
INTRAMUSCULAR | Status: DC | PRN
  Administered 2021-06-19 (×2): 50 ug via INTRAVENOUS

## 2021-06-19 MED ORDER — DEXAMETHASONE SODIUM PHOSPHATE 10 MG/ML IJ SOLN
INTRAMUSCULAR | Status: AC
  Filled 2021-06-19: qty 1

## 2021-06-19 MED ORDER — MIDAZOLAM HCL 2 MG/2ML IJ SOLN
INTRAMUSCULAR | Status: DC | PRN
  Administered 2021-06-19: 2 mg via INTRAVENOUS

## 2021-06-19 MED ORDER — CHLORHEXIDINE GLUCONATE 0.12 % MT SOLN
OROMUCOSAL | Status: AC
  Administered 2021-06-19: 15 mL via OROMUCOSAL
  Filled 2021-06-19: qty 15

## 2021-06-19 MED ORDER — OXYCODONE HCL 5 MG/5ML PO SOLN: 5.0000 mg | Freq: Once | ORAL | Status: DC | PRN

## 2021-06-19 MED ORDER — ONDANSETRON HCL 4 MG/2ML IJ SOLN
INTRAMUSCULAR | Status: AC
  Filled 2021-06-19: qty 2

## 2021-06-19 MED ORDER — ONDANSETRON HCL 4 MG/2ML IJ SOLN
INTRAMUSCULAR | Status: DC | PRN
  Administered 2021-06-19: 4 mg via INTRAVENOUS

## 2021-06-19 MED ORDER — PROPOFOL 500 MG/50ML IV EMUL
INTRAVENOUS | Status: DC | PRN
  Administered 2021-06-19: 140 ug/kg/min via INTRAVENOUS
  Administered 2021-06-19: 100 mg via INTRAVENOUS

## 2021-06-19 MED ORDER — HYDROCODONE-ACETAMINOPHEN 5-325 MG PO TABS: 1.0000 | ORAL_TABLET | Freq: Four times a day (QID) | ORAL | 0 refills | Status: AC | PRN

## 2021-06-19 MED ORDER — FENTANYL CITRATE (PF) 100 MCG/2ML IJ SOLN
INTRAMUSCULAR | Status: AC
  Filled 2021-06-19: qty 2

## 2021-06-19 MED ORDER — SODIUM CHLORIDE 0.9 % IR SOLN
Status: DC | PRN
  Administered 2021-06-19: 250 mL

## 2021-06-19 MED ORDER — FAMOTIDINE 20 MG PO TABS
ORAL_TABLET | ORAL | Status: AC
  Administered 2021-06-19: 20 mg via ORAL
  Filled 2021-06-19: qty 1

## 2021-06-19 MED ORDER — DOXYCYCLINE HYCLATE 100 MG PO TBEC: 100.0000 mg | DELAYED_RELEASE_TABLET | Freq: Two times a day (BID) | ORAL | 0 refills | Status: DC

## 2021-06-19 MED ORDER — NEOMYCIN-POLYMYXIN B GU 40-200000 IR SOLN
Status: AC
  Filled 2021-06-19: qty 2

## 2021-06-19 MED ORDER — KETAMINE HCL 50 MG/5ML IJ SOSY
PREFILLED_SYRINGE | INTRAMUSCULAR | Status: AC
  Filled 2021-06-19: qty 5

## 2021-06-19 MED ORDER — PHENYLEPHRINE HCL (PRESSORS) 10 MG/ML IV SOLN
INTRAVENOUS | Status: DC | PRN
  Administered 2021-06-19: 80 ug via INTRAVENOUS

## 2021-06-19 MED ORDER — MIDAZOLAM HCL 2 MG/2ML IJ SOLN
INTRAMUSCULAR | Status: AC
  Filled 2021-06-19: qty 2

## 2021-06-19 MED ORDER — OXYCODONE HCL 5 MG PO TABS: 5.0000 mg | ORAL_TABLET | Freq: Once | ORAL | Status: DC | PRN

## 2021-06-19 MED ORDER — FENTANYL CITRATE (PF) 100 MCG/2ML IJ SOLN: 25.0000 ug | INTRAMUSCULAR | Status: DC | PRN

## 2021-06-19 MED ORDER — LIDOCAINE HCL (CARDIAC) PF 100 MG/5ML IV SOSY
PREFILLED_SYRINGE | INTRAVENOUS | Status: DC | PRN
  Administered 2021-06-19: 100 mg via INTRAVENOUS

## 2021-06-19 MED ORDER — GLYCOPYRROLATE PF 0.2 MG/ML IJ SOSY
PREFILLED_SYRINGE | INTRAMUSCULAR | Status: DC | PRN
  Administered 2021-06-19: .1 mg via INTRAVENOUS

## 2021-06-19 MED ORDER — BUPIVACAINE HCL (PF) 0.5 % IJ SOLN
INTRAMUSCULAR | Status: AC
  Filled 2021-06-19: qty 30

## 2021-06-19 MED ORDER — LIDOCAINE HCL (PF) 2 % IJ SOLN
INTRAMUSCULAR | Status: AC
  Filled 2021-06-19: qty 5

## 2021-06-19 SURGICAL SUPPLY — 37 items
BLADE SURG 15 STRL LF DISP TIS (BLADE) IMPLANT
BLADE SURG 15 STRL SS (BLADE) ×3
BNDG ELASTIC 4X5.8 VLCR STR LF (GAUZE/BANDAGES/DRESSINGS) ×3 IMPLANT
BNDG ESMARK 4X12 TAN STRL LF (GAUZE/BANDAGES/DRESSINGS) ×3 IMPLANT
BNDG GAUZE ELAST 4 BULKY (GAUZE/BANDAGES/DRESSINGS) ×4 IMPLANT
DRAPE BILAT LIMB 76X120 89291 (MISCELLANEOUS) ×1 IMPLANT
ELECT REM PT RETURN 9FT ADLT (ELECTROSURGICAL) ×3
ELECTRODE REM PT RTRN 9FT ADLT (ELECTROSURGICAL) ×2 IMPLANT
GAUZE 4X4 16PLY ~~LOC~~+RFID DBL (SPONGE) ×1 IMPLANT
GAUZE SPONGE 4X4 12PLY STRL (GAUZE/BANDAGES/DRESSINGS) ×7 IMPLANT
GAUZE XEROFORM 1X8 LF (GAUZE/BANDAGES/DRESSINGS) ×4 IMPLANT
GLOVE SURG ENC MOIS LTX SZ7 (GLOVE) ×3 IMPLANT
GLOVE SURG UNDER LTX SZ7 (GLOVE) ×3 IMPLANT
GOWN STRL REUS W/ TWL LRG LVL3 (GOWN DISPOSABLE) ×4 IMPLANT
GOWN STRL REUS W/TWL LRG LVL3 (GOWN DISPOSABLE) ×6
HANDPIECE VERSAJET DEBRIDEMENT (MISCELLANEOUS) ×1 IMPLANT
IV NS IRRIG 3000ML ARTHROMATIC (IV SOLUTION) ×1 IMPLANT
KIT TURNOVER KIT A (KITS) ×3 IMPLANT
LABEL OR SOLS (LABEL) ×3 IMPLANT
MANIFOLD NEPTUNE II (INSTRUMENTS) ×3 IMPLANT
NDL FILTER BLUNT 18X1 1/2 (NEEDLE) ×2 IMPLANT
NDL HYPO 25X1 1.5 SAFETY (NEEDLE) ×4 IMPLANT
NEEDLE FILTER BLUNT 18X 1/2SAF (NEEDLE) ×1
NEEDLE FILTER BLUNT 18X1 1/2 (NEEDLE) ×2 IMPLANT
NEEDLE HYPO 25X1 1.5 SAFETY (NEEDLE) ×6 IMPLANT
NS IRRIG 500ML POUR BTL (IV SOLUTION) ×3 IMPLANT
PACK EXTREMITY ARMC (MISCELLANEOUS) ×3 IMPLANT
PAD ABD DERMACEA PRESS 5X9 (GAUZE/BANDAGES/DRESSINGS) ×2 IMPLANT
SOL PREP PVP 2OZ (MISCELLANEOUS) ×6
SOLUTION PREP PVP 2OZ (MISCELLANEOUS) ×2 IMPLANT
STOCKINETTE STRL 6IN 960660 (GAUZE/BANDAGES/DRESSINGS) ×4 IMPLANT
SUT ETHILON 3-0 FS-10 30 BLK (SUTURE) ×6
SUT VIC AB 3-0 SH 27 (SUTURE) ×3
SUT VIC AB 3-0 SH 27X BRD (SUTURE) ×2 IMPLANT
SUTURE EHLN 3-0 FS-10 30 BLK (SUTURE) ×4 IMPLANT
SYR 10ML LL (SYRINGE) ×3 IMPLANT
WATER STERILE IRR 500ML POUR (IV SOLUTION) ×3 IMPLANT

## 2021-06-19 NOTE — Discharge Instructions (Addendum)
[place] ?[place] ? ?POST OPERATIVE INSTRUCTIONS FOR DR. Vickki Muff AND DR. BAKER ?[place] ? ? ?Take your medication as prescribed.  Pain medication should be taken only as needed. ? ?Keep dressings to left foot clean, dry, and intact until your postoperative appointment.  You may change the right foot dressing every other day and apply collagen soaked saline to the wound followed by 4 x 4 gauze, ABD, Kerlix, Ace wrap.  Try to stay off of both feet is much as possible.  If you are ambulating recommend trying to ambulate with heel contact and surgical shoes.  Once again still would like you to try to stay off your feet is much as possible. ? ?Keep your foot elevated above the heart level for the first 48 hours.  Continue elevation thereafter to the left lower extremity to improve swelling. ? ?Walking to the bathroom and brief periods of walking are acceptable, unless we have instructed you to be non-weight bearing. ? ?Always wear your post-op shoe when walking.  Always use your crutches if you are to be non-weight bearing. ? ?Do not take a shower. Baths are permissible as long as the foot is kept out of the water.  ? ?Every hour you are awake:  ?Bend your knee 15 times. ?Massage calf 15 times ? ?Call [facility] [phone]) if any of the following problems occur: ?You develop a temperature or fever. ?The bandage becomes saturated with blood. ?Medication does not stop your pain. ?Injury of the foot occurs. ?Any symptoms of infection including redness, odor, or red streaks running from wound. ? ? ?AMBULATORY SURGERY  ?DISCHARGE INSTRUCTIONS ? ? ?The drugs that you were given will stay in your system until tomorrow so for the next 24 hours you should not: ? ?Drive an automobile ?Make any legal decisions ?Drink any alcoholic beverage ? ? ?You may resume regular meals tomorrow.  Today it is better to start with liquids and gradually work up to solid  foods. ? ?You may eat anything you prefer, but it is better to start with liquids, then soup and crackers, and gradually work up to solid foods. ? ? ?Please notify your doctor immediately if you have any unusual bleeding, trouble breathing, redness and pain at the surgery site, drainage, fever, or pain not relieved by medication. ? ? ? ?Additional Instructions: ? ? ? ? ? ? ? ?Please contact your physician with any problems or Same Day Surgery at [phone], Monday through Friday 6 am to 4 pm, or  at [facility] number at [phone].  ?

## 2021-06-19 NOTE — Transfer of Care (Signed)
Immediate Anesthesia Transfer of Care Note ? ?Patient: Charles [name] ? ?Procedure(s) Performed: AMPUTATION TOE METATARSOPHALANGEAL JOINT (Left: Toe) ?DEBRIDE, OPEN WOUND, FIRST 20 SQ CM (Bilateral) ? ?Patient Location: PACU ? ?Anesthesia Type:General ? ?Level of Consciousness: awake ? ?Airway & Oxygen Therapy: Patient Spontanous Breathing ? ?Post-op Assessment: Report given to RN and Post -op Vital signs reviewed and stable ? ?Post vital signs: Reviewed and stable ? ?Last Vitals:  ?Vitals Value Taken Time  ?BP 111/66 06/19/21 1656  ?Temp 98.7   ?Pulse 62 06/19/21 1700  ?Resp 14 06/19/21 1700  ?SpO2 97 % 06/19/21 1700  ?Vitals shown include unvalidated device data. ? ?Last Pain:  ?Vitals:  ? 06/19/21 1440  ?TempSrc: Temporal  ?PainSc: 5   ?   ? ?  ? ?Complications: No notable events documented. ?

## 2021-06-19 NOTE — Anesthesia Preprocedure Evaluation (Signed)
Anesthesia Evaluation  ?Patient identified by MRN, date of birth, ID band ?Patient awake ? ? ? ?Reviewed: ?Allergy & Precautions, NPO status , Patient's Chart, lab work & pertinent test results, reviewed documented beta blocker date and time  ? ?Airway ?Mallampati: II ? ?TM Distance: >3 FB ?Neck ROM: Full ? ? ? Dental ? ?(+) Poor Dentition, Missing ?  ?Pulmonary ?asthma , sleep apnea ,  ?  ?Pulmonary exam normal ?breath sounds clear to auscultation ? ? ? ? ? ? Cardiovascular ?Exercise Tolerance: Good ?hypertension, Pt. on home beta blockers ?(-) angina+ CAD (s/p MI and stents), + Past MI (2017) and + Cardiac Stents (2017)  ?Normal cardiovascular exam ? ?ECG 01/31/21: Sinus bradycardia (HR 59), otherwise normal ? ?Echo 11/12/20:  ?NORMAL LEFT VENTRICULAR SYSTOLIC FUNCTION  ?NORMAL RIGHT VENTRICULAR SYSTOLIC FUNCTION  ?NO VALVULAR STENOSIS  ?TRIVIAL MR, TR  ?EF 50-55% ?  ?Neuro/Psych ?PSYCHIATRIC DISORDERS (ADHD) Anxiety Depression Chronic pain s/p intrathecal pain pump ? ? ? Neuromuscular disease (Neuropathy both feet)   ? GI/Hepatic ?PUD, GERD  ,S/p gastric bypass 2011 ?  ?Endo/Other  ?diabetes, Type 2Hypothyroidism  ? Renal/GU ?Renal disease (nephrolithiasis)  ? ?  ?Musculoskeletal ? ?(+) Arthritis , Gout   ? Abdominal ?(+) + obese,   ?Peds ? Hematology ? ?(+) Blood dyscrasia, anemia ,   ?Anesthesia Other Findings ?Reviewed 11/19/20 cardiology note. ? ?ECHO 11/20: [facility] ? ? ?Problems: ?1. Left hallux osteomyelitis status post crush injury ?2. Diabetic foot ulcerations right and left foot ?3. Neuropathic pain, history of chronic pain syndrome ?4. History of gross noncompliance ? Reproductive/Obstetrics ? ?  ? ? ? ? ? ? ? ? ? ? ? ? ? ?  ?  ? ? ? ? ? ? ? ? ?Anesthesia Physical ? ?Anesthesia Plan ? ?ASA: 3 ? ?Anesthesia Plan: General  ? ?Post-op Pain Management:   ? ?Induction: Intravenous ? ?PONV Risk Score and Plan: 2 and Ondansetron, Dexamethasone and Treatment may vary due to age or  medical condition ? ?Airway Management Planned: Natural Airway and Nasal Cannula ? ?Additional Equipment:  ? ?Intra-op Plan:  ? ?Post-operative Plan: Extubation in OR ? ?Informed Consent: I have reviewed the patients History and Physical, chart, labs and discussed the procedure including the risks, benefits and alternatives for the proposed anesthesia with the patient or authorized representative who has indicated his/her understanding and acceptance.  ? ? ? ?Dental advisory given ? ?Plan Discussed with: CRNA ? ?Anesthesia Plan Comments: (Patient consented for risks of anesthesia including but not limited to:  ?- adverse reactions to medications ?- risk of airway placement if required ?- damage to eyes, teeth, lips or other oral mucosa ?- nerve damage due to positioning  ?- sore throat or hoarseness ?- Damage to heart, brain, nerves, lungs, other parts of body or loss of life ? ?Patient voiced understanding.)  ? ? ? ? ? ? ?Anesthesia Quick Evaluation ? ?

## 2021-06-19 NOTE — H&P (Signed)
HISTORY AND PHYSICAL INTERVAL NOTE: ? ?06/19/2021 ? ?3:34 PM ? ?Charles [name]  has presented today for surgery, with the diagnosis of G62.9 - Polyneuropathy, unspecified ?T01.601 - Non-pressure chronic ulcer of heel & midfoot with fat layer exposed ?U93.235 - Non-pressure chronic ulcer of other part of foot with necrosis of bone ?M86.172 - Osteomyelitis, ankle and foot, acute.  The various methods of treatment have been discussed with the patient.  No guarantees were given.  After consideration of risks, benefits and other options for treatment, the patient has consented to surgery.  I have reviewed the patients? chart and labs.   ? ?PROCEDURE: ?LEFT FOOT PARTIAL 1ST RAY AMPUTATION ?LEFT FOOT WOUND DEBRIDEMENT ?RIGHT FOOT WOUND DEBRIDEMENT ? ?A history and physical examination was performed in my office.  The patient was reexamined.  There have been no changes to this history and physical examination. ? ?Caroline More, DPM ? ?

## 2021-06-19 NOTE — Op Note (Signed)
PODIATRY / FOOT AND ANKLE SURGERY OPERATIVE REPORT ? ? ? ?SURGEON: Caroline More, DPM ? ?PRE-OPERATIVE DIAGNOSIS:  ?1.  Left foot wound dehiscence status post partial amputation of hallux secondary to crush injury, exposed bone, probable osteomyelitis ?2.  Left subcutaneous ulceration fifth metatarsal phalangeal joint plantar lateral ?3.  Right subcutaneous ulceration plantar midfoot ? ?POST-OPERATIVE DIAGNOSIS: Same ? ?PROCEDURE(S): ?Left hallux amputation ?Left foot wound debridement, 100% excisional subcutaneous  ?Right foot wound debridement, 100% excisional subcutaneous ? ?HEMOSTASIS: Left ankle tourniquet ? ?ANESTHESIA: MAC ? ?ESTIMATED BLOOD LOSS: 20 cc ? ?FINDING(S): ?1.  Bone exposed to the left distal phalanx of the hallux ?2.  Left plantar lateral fifth metatarsal phalangeal joint ulceration, appears to be very close to the capsule of the fifth metatarsal phalangeal joint ?3.  Right foot wound plantar midfoot subcutaneous tissue exposed ? ?PATHOLOGY/SPECIMEN(S): Left hallux with proximal margin marked in purple ink ? ?INDICATIONS:   ?Charles [name] is a 61 y.o. male who presents with nonhealing ulcerations to both feet as well as the left hallux.  Patient is status post partial left hallux amputation due to a crush injury in which she also had osteomyelitis of the bone.  Patient subsequently developed wound dehiscence postoperatively to the area and still has some bone exposed to the distal portion of the proximal phalanx.  Patient also has chronic stable wounds to both feet.  All treatment options were discussed with the patient both conservative and surgical attempts at correction including potential risks and complications at this time patient is elected for surgical intervention consisting of wound debridements to both feet as well as left hallux amputation.  No guarantees given.  Patient still stands high risk of limb loss due to previous amputations, neuropathy, and history of  noncompliance. ? ?DESCRIPTION: ?After obtaining full informed written consent, the patient was brought back to the operating room and placed supine upon the operating table.  The patient received IV antibiotics prior to induction.  After obtaining adequate anesthesia, left hallux/first ray block performed with 10 cc of half percent Marcaine plain and periwound injections utilizing 10 cc of half percent Marcaine plain about the left fifth ray and right plantar midfoot.  The patient was prepped and draped in the standard fashion.  An Esmarch bandage was used to exsanguinate the left lower extremity and the pneumatic ankle tourniquet was inflated. ? ?Attention was then directed to the left hallux where a racquet type of incision was made over the base the proximal phalanx of the hallux.  An arm was extended from the incision medially along the first metatarsal head.  The incision was made straight to bone.  The extensor tendon was visualized over the first metatarsal phalangeal joint and an extensor tenotomy and capsulotomy was performed followed by release of the collateral and suspensory ligaments as well as the flexor tendon and plantar plate attachment.  The hallux was disarticulated and passed off the operative site.  There did not appear to be bone exposed through the ulcerative site at the level of the previous amputation site and the bone appeared to have some changes consistent with osteomyelitis as the end of the bone appeared to be somewhat dusky and soft.  The first metatarsal head was inspected and all the cartilage appeared to be intact and there did not appear to be any further subsequent changes for osteomyelitis.  The left hallux was passed off the operative site and the proximal margin was marked in purple ink and sent off to pathology for  further assessment.  The surgical site was flushed with copious amounts normal sterile saline.  The subcutaneous tissue was then reapproximated well coapted with  3-0 Vicryl and the skin was then reapproximated well coapted with 3-0 nylon in a combination of simple horizontal mattress type stitching. ? ?Attention was then directed to the left plantar lateral fifth metatarsal phalangeal joint where the wound was present to this area.  Predebridement measurement was same as postdebridement and measured approximately 2 cm x 1.6 cm x 0.3 cm, appeared to be very close to the the capsule of the fifth metatarsal phalangeal joint, wound bed prior to debridement appeared to be 80% granular, 20% fibrotic with fibrous tissue present.  100% subcutaneous excisional wound debridement performed with 15 blade, tissue nipper, and Versajet without incident.  Wound appeared to be 100% granular after debridement. ? ?Attention was then directed to the plantar aspect of the right midfoot or the wound was present.  Predebridement measurement was once again same as postdebridement measured 1 cm x 0.6 cm x 0.2 cm, wound bed appeared to be 80% granular, 20% fibrotic, fibrous tissue present with biofilm and some surrounding hyperkeratotic tissue around the wound.  100% subcutaneous excisional wound debridement performed with Versajet, 15 blade, and tissue nipper without incident.  Wound appeared to be 100% granular after debridement. ? ?The pneumatic ankle tourniquet was deflated to the left side and a prompt hyperemic response was noted all digits to this foot.  Postoperative dressings were then applied consisting of Xeroform to the wounds incision sites followed by 4 x 4 gauze, ABD, Kerlix, Ace wrap.  The patient tolerated the procedure and anesthesia well was transferred to recovery room vital signs stable vascular status appearing to be intact to all toes left foot and the right foot.  Patient to be discharged home with the appropriate orders, follow-up instructions, and medications.  Patient is to try to stay off of both feet is much as possible.  Patient still remains at high risk for limb loss  due to open ulcerations present as well as previous history of amputations.  Stressed compliance. ? ?COMPLICATIONS: None ? ?CONDITION: Good, stable ? ?Caroline More, DPM ? ?

## 2021-06-20 ENCOUNTER — Encounter: Payer: Self-pay | Admitting: Podiatry

## 2021-06-20 NOTE — Anesthesia Postprocedure Evaluation (Signed)
Anesthesia Post Note ? ?Patient: Charles [name] ? ?Procedure(s) Performed: AMPUTATION TOE METATARSOPHALANGEAL JOINT (Left: Toe) ?DEBRIDE, OPEN WOUND, FIRST 20 SQ CM (Bilateral) ? ?Patient location during evaluation: PACU ?Anesthesia Type: General ?Level of consciousness: awake and alert ?Pain management: pain level controlled ?Vital Signs Assessment: post-procedure vital signs reviewed and stable ?Respiratory status: spontaneous breathing, nonlabored ventilation, respiratory function stable and patient connected to nasal cannula oxygen ?Cardiovascular status: blood pressure returned to baseline and stable ?Postop Assessment: no apparent nausea or vomiting ?Anesthetic complications: no ? ? ?No notable events documented. ? ? ?Last Vitals:  ?Vitals:  ? 06/19/21 1715 06/19/21 1737  ?BP: 129/69 133/83  ?Pulse: 60 65  ?Resp: (!) 0 18  ?Temp: 37 ?C (!) 35.9 ?C  ?SpO2: 96% 95%  ?  ?Last Pain:  ?Vitals:  ? 06/19/21 1737  ?TempSrc: Temporal  ?PainSc: 5   ? ? ?  ?  ?  ?  ?  ?  ? ?[name] ? ? ? ? ?

## 2021-06-23 LAB — SURGICAL PATHOLOGY

## 2021-06-27 ENCOUNTER — Ambulatory Visit: Payer: Managed Care, Other (non HMO) | Admitting: Urology

## 2021-07-02 ENCOUNTER — Other Ambulatory Visit: Payer: Self-pay | Admitting: Urology

## 2021-07-02 DIAGNOSIS — N401 Enlarged prostate with lower urinary tract symptoms: Secondary | ICD-10-CM

## 2021-07-03 ENCOUNTER — Telehealth: Payer: Self-pay

## 2021-07-04 ENCOUNTER — Other Ambulatory Visit: Payer: Self-pay | Admitting: Physician Assistant

## 2021-07-04 ENCOUNTER — Encounter: Payer: Self-pay | Admitting: Gastroenterology

## 2021-07-04 ENCOUNTER — Telehealth: Payer: Self-pay

## 2021-07-04 DIAGNOSIS — G4719 Other hypersomnia: Secondary | ICD-10-CM

## 2021-07-04 MED ORDER — LISDEXAMFETAMINE DIMESYLATE 40 MG PO CAPS: 40.0000 mg | ORAL_CAPSULE | Freq: Every morning | ORAL | 0 refills | Status: DC

## 2021-07-04 NOTE — Telephone Encounter (Signed)
error 

## 2021-07-04 NOTE — Telephone Encounter (Signed)
Completed medical records for [facility] Faxed [phone]  

## 2021-07-07 ENCOUNTER — Encounter: Payer: Self-pay | Admitting: Gastroenterology

## 2021-07-07 ENCOUNTER — Encounter
Admission: RE | Disposition: A | Discharge status: Home / Self Care | Payer: Self-pay | Source: Home / Self Care | Attending: Gastroenterology

## 2021-07-07 ENCOUNTER — Ambulatory Visit: Payer: Managed Care, Other (non HMO) | Admitting: Anesthesiology

## 2021-07-07 ENCOUNTER — Ambulatory Visit
Admission: RE | Admit: 2021-07-07 | Discharge: 2021-07-07 | Disposition: A | Discharge status: Home / Self Care | Payer: Managed Care, Other (non HMO) | Attending: Gastroenterology | Admitting: Gastroenterology

## 2021-07-07 DIAGNOSIS — K635 Polyp of colon: Secondary | ICD-10-CM

## 2021-07-07 DIAGNOSIS — Z1211 Encounter for screening for malignant neoplasm of colon: Secondary | ICD-10-CM | POA: Diagnosis present

## 2021-07-07 DIAGNOSIS — E039 Hypothyroidism, unspecified: Secondary | ICD-10-CM | POA: Diagnosis not present

## 2021-07-07 DIAGNOSIS — F32A Depression, unspecified: Secondary | ICD-10-CM | POA: Insufficient documentation

## 2021-07-07 DIAGNOSIS — D120 Benign neoplasm of cecum: Secondary | ICD-10-CM | POA: Diagnosis not present

## 2021-07-07 DIAGNOSIS — K219 Gastro-esophageal reflux disease without esophagitis: Secondary | ICD-10-CM | POA: Insufficient documentation

## 2021-07-07 DIAGNOSIS — F419 Anxiety disorder, unspecified: Secondary | ICD-10-CM | POA: Diagnosis not present

## 2021-07-07 DIAGNOSIS — F909 Attention-deficit hyperactivity disorder, unspecified type: Secondary | ICD-10-CM | POA: Diagnosis not present

## 2021-07-07 HISTORY — PX: COLONOSCOPY WITH PROPOFOL: SHX5780

## 2021-07-07 SURGERY — COLONOSCOPY WITH PROPOFOL
Anesthesia: General

## 2021-07-07 MED ORDER — PROPOFOL 10 MG/ML IV BOLUS
INTRAVENOUS | Status: AC
  Filled 2021-07-07: qty 20

## 2021-07-07 MED ORDER — EPHEDRINE SULFATE (PRESSORS) 50 MG/ML IJ SOLN
INTRAMUSCULAR | Status: DC | PRN
  Administered 2021-07-07: 5 mg via INTRAVENOUS

## 2021-07-07 MED ORDER — SODIUM CHLORIDE 0.9 % IV SOLN
INTRAVENOUS | Status: DC
  Administered 2021-07-07: 1000 mL via INTRAVENOUS

## 2021-07-07 MED ORDER — DEXMEDETOMIDINE (PRECEDEX) IN NS 20 MCG/5ML (4 MCG/ML) IV SYRINGE
PREFILLED_SYRINGE | INTRAVENOUS | Status: DC | PRN
  Administered 2021-07-07: 12 ug via INTRAVENOUS

## 2021-07-07 MED ORDER — PROPOFOL 10 MG/ML IV BOLUS
INTRAVENOUS | Status: DC | PRN
  Administered 2021-07-07: 100 mg via INTRAVENOUS
  Administered 2021-07-07: 50 mg via INTRAVENOUS
  Administered 2021-07-07: 100 mg via INTRAVENOUS

## 2021-07-07 MED ORDER — LIDOCAINE HCL (CARDIAC) PF 100 MG/5ML IV SOSY
PREFILLED_SYRINGE | INTRAVENOUS | Status: DC | PRN
  Administered 2021-07-07: 50 mg via INTRAVENOUS

## 2021-07-07 MED ORDER — METHYLENE BLUE 0.5 % INJ SOLN
INTRAVENOUS | Status: DC | PRN
  Administered 2021-07-07: 8 mL

## 2021-07-07 MED ORDER — PROPOFOL 500 MG/50ML IV EMUL
INTRAVENOUS | Status: DC | PRN
  Administered 2021-07-07: 150 ug/kg/min via INTRAVENOUS

## 2021-07-07 MED ORDER — GLYCOPYRROLATE 0.2 MG/ML IJ SOLN
INTRAMUSCULAR | Status: DC | PRN
  Administered 2021-07-07: .2 mg via INTRAVENOUS

## 2021-07-07 NOTE — Op Note (Signed)
[facility] ?Gastroenterology ?Patient Name: Charles [name] ?Procedure Date: 07/07/2021 2:48 PM ?MRN: [id number] ?Account #: [id number] ?Date of Birth: 01/16/1961 ?Admit Type: Outpatient ?Age: 61 ?Room: [facility] ENDO ROOM 1 ?Gender: Male ?Note Status: Finalized ?Instrument Name: Colonoscope [id number] ?Procedure:             Colonoscopy ?Indications:           Screening for colorectal malignant neoplasm ?Providers:             Lin Landsman MD, MD ?Medicines:             General Anesthesia ?Complications:         No immediate complications. Estimated blood loss: None. ?Procedure:             Pre-Anesthesia Assessment: ?                       - Prior to the procedure, a History and Physical was  ?                       performed, and patient medications and allergies were  ?                       reviewed. The patient is competent. The risks and  ?                       benefits of the procedure and the sedation options and  ?                       risks were discussed with the patient. All questions  ?                       were answered and informed consent was obtained.  ?                       Patient identification and proposed procedure were  ?                       verified by the physician, the nurse, the  ?                       anesthesiologist, the anesthetist and the technician  ?                       in the pre-procedure area in the procedure room in the  ?                       endoscopy suite. Mental Status Examination: alert and  ?                       oriented. Airway Examination: normal oropharyngeal  ?                       airway and neck mobility. Respiratory Examination:  ?                       clear to auscultation. CV Examination: normal.  ?                       Prophylactic Antibiotics: The  patient does not require  ?                       prophylactic antibiotics. Prior Anticoagulants: The  ?                       patient has taken no previous anticoagulant or  ?                        antiplatelet agents. ASA Grade Assessment: III - A  ?                       patient with severe systemic disease. After reviewing  ?                       the risks and benefits, the patient was deemed in  ?                       satisfactory condition to undergo the procedure. The  ?                       anesthesia plan was to use general anesthesia.  ?                       Immediately prior to administration of medications,  ?                       the patient was re-assessed for adequacy to receive  ?                       sedatives. The heart rate, respiratory rate, oxygen  ?                       saturations, blood pressure, adequacy of pulmonary  ?                       ventilation, and response to care were monitored  ?                       throughout the procedure. The physical status of the  ?                       patient was re-assessed after the procedure. ?                       After obtaining informed consent, the colonoscope was  ?                       passed under direct vision. Throughout the procedure,  ?                       the patient's blood pressure, pulse, and oxygen  ?                       saturations were monitored continuously. The  ?                       Colonoscope was introduced through the anus and  ?  advanced to the the terminal ileum. The colonoscopy  ?                       was performed without difficulty. The patient  ?                       tolerated the procedure well. The quality of the bowel  ?                       preparation was evaluated using the BBPS [facility] Bowel  ?                       Preparation Scale) with scores of: Right Colon = 3,  ?                       Transverse Colon = 3 and Left Colon = 3 (entire mucosa  ?                       seen well with no residual staining, small fragments  ?                       of stool or opaque liquid). The total BBPS score  ?                       equals 9. ?Findings: ?     The perianal  and digital rectal examinations were normal. Pertinent  ?     negatives include normal sphincter tone and no palpable rectal lesions. ?     The terminal ileum appeared normal. ?     A 30 mm polyp was found in the cecum. The polyp was carpet-like, flat  ?     and lateral spreading. Preparations were made for mucosal resection. NBI  ?     using NBI chromoendoscopy technique was done to mark the borders of the  ?     lesion. Eleview was injected with adequate lift of the lesion from the  ?     muscularis propria. Snare mucosal resection with suction (via the  ?     working channel) retrieval was performed. A 30 mm area was resected.  ?     Resection and retrieval were complete. There was no bleeding during and  ?     at the end of the procedure. To prevent bleeding after mucosal  ?     resection, seven hemostatic clips were successfully placed (MR  ?     conditional). There was no bleeding during, or at the end, of the  ?     procedure. ?     The retroflexed view of the distal rectum and anal verge was normal and  ?     showed no anal or rectal abnormalities. ?Impression:            - The examined portion of the ileum was normal. ?                       - One 30 mm polyp in the cecum, removed with mucosal  ?                       resection. Resected and retrieved. Clips (MR  ?  conditional) were placed. ?                       - The distal rectum and anal verge are normal on  ?                       retroflexion view. ?                       - Mucosal resection was performed. Resection and  ?                       retrieval were complete. ?Recommendation:        - Discharge patient to home (with escort). ?                       - Resume previous diet today. ?                       - Continue present medications. ?                       - Await pathology results. ?                       - Repeat colonoscopy in 1 year for surveillance after  ?                       piecemeal polypectomy. ?Procedure  Code(s):     --- Professional --- ?                       [phone], Colonoscopy, flexible; with endoscopic mucosal  ?                       resection ?Diagnosis Code(s):     --- Professional --- ?                       Z12.11, Encounter for screening for malignant neoplasm  ?                       of colon ?                       K63.5, Polyp of colon ?CPT copyright 2019 American Medical Association. All rights reserved. ?The codes documented in this report are preliminary and upon coder review may  ?be revised to meet current compliance requirements. ?Dr. Ulyess Mort ?Ethridge Sollenberger Raeanne Gathers MD, MD ?07/07/2021 3:44:58 PM ?This report has been signed electronically. ?Number of Addenda: 0 ?Note Initiated On: 07/07/2021 2:48 PM ?Scope Withdrawal Time: 0 hours 27 minutes 22 seconds  ?Total Procedure Duration: 0 hours 38 minutes 23 seconds  ?Estimated Blood Loss:  Estimated blood loss: none. ?     [facility] ?

## 2021-07-07 NOTE — Transfer of Care (Signed)
Immediate Anesthesia Transfer of Care Note ? ?Patient: Charles [name] ? ?Procedure(s) Performed: COLONOSCOPY WITH PROPOFOL ? ?Patient Location: PACU and Endoscopy Unit ? ?Anesthesia Type:General ? ?Level of Consciousness: drowsy ? ?Airway & Oxygen Therapy: Patient Spontanous Breathing ? ?Post-op Assessment: Report given to RN ? ?Post vital signs: stable ? ?Last Vitals:  ?Vitals Value Taken Time  ?BP    ?Temp    ?Pulse    ?Resp    ?SpO2    ? ? ?Last Pain:  ?Vitals:  ? 07/07/21 1340  ?TempSrc: Temporal  ?PainSc: 6   ?   ? ?Patients Stated Pain Goal: 0 (07/07/21 1340) ? ?Complications: No notable events documented. ?
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
## 2021-07-08 ENCOUNTER — Encounter: Payer: Self-pay | Admitting: Gastroenterology

## 2021-07-09 LAB — SURGICAL PATHOLOGY

## 2021-07-09 NOTE — Anesthesia Postprocedure Evaluation (Signed)
Anesthesia Post Note ? ?Patient: Charles [name] ? ?Procedure(s) Performed: COLONOSCOPY WITH PROPOFOL ? ?Patient location during evaluation: PACU ?Anesthesia Type: General ?Level of consciousness: awake and alert ?Pain management: pain level controlled ?Vital Signs Assessment: post-procedure vital signs reviewed and stable ?Respiratory status: spontaneous breathing, nonlabored ventilation, respiratory function stable and patient connected to nasal cannula oxygen ?Cardiovascular status: blood pressure returned to baseline and stable ?Postop Assessment: no apparent nausea or vomiting ?Anesthetic complications: no ? ? ?No notable events documented. ? ? ?Last Vitals:  ?Vitals:  ? 07/07/21 1551 07/07/21 1601  ?BP: 124/78 118/79  ?Pulse: (!) 53 70  ?Resp: 20 18  ?Temp:    ?SpO2:    ?  ?Last Pain:  ?Vitals:  ? 07/07/21 1601  ?TempSrc:   ?PainSc: 0-No pain  ? ? ?  ?  ?  ?  ?  ?  ? ?Molli Barrows ? ? ? ? ?

## 2021-07-10 ENCOUNTER — Encounter: Payer: Self-pay | Admitting: Gastroenterology

## 2021-07-15 ENCOUNTER — Other Ambulatory Visit: Payer: Self-pay

## 2021-07-15 ENCOUNTER — Encounter: Payer: No Typology Code available for payment source | Attending: Physician Assistant | Admitting: Physician Assistant

## 2021-07-15 DIAGNOSIS — L97512 Non-pressure chronic ulcer of other part of right foot with fat layer exposed: Secondary | ICD-10-CM | POA: Insufficient documentation

## 2021-07-15 DIAGNOSIS — I2510 Atherosclerotic heart disease of native coronary artery without angina pectoris: Secondary | ICD-10-CM | POA: Insufficient documentation

## 2021-07-15 DIAGNOSIS — T8131XA Disruption of external operation (surgical) wound, not elsewhere classified, initial encounter: Secondary | ICD-10-CM | POA: Insufficient documentation

## 2021-07-15 DIAGNOSIS — Y835 Amputation of limb(s) as the cause of abnormal reaction of the patient, or of later complication, without mention of misadventure at the time of the procedure: Secondary | ICD-10-CM | POA: Diagnosis not present

## 2021-07-15 DIAGNOSIS — L97522 Non-pressure chronic ulcer of other part of left foot with fat layer exposed: Secondary | ICD-10-CM | POA: Diagnosis not present

## 2021-07-15 DIAGNOSIS — I10 Essential (primary) hypertension: Secondary | ICD-10-CM | POA: Insufficient documentation

## 2021-07-15 DIAGNOSIS — E11621 Type 2 diabetes mellitus with foot ulcer: Secondary | ICD-10-CM | POA: Diagnosis not present

## 2021-07-15 NOTE — Progress Notes (Signed)
LACHARLES, ALTSCHULER. ([id number]) ?Visit Report for 07/15/2021 ?Abuse Risk Screen Details ?Patient Name: Charles [name], Charles [name] ?Date of Service: 07/15/2021 8:45 AM ?Medical Record Number: [id number] ?Patient Account Number: [id number] ?Date of Birth/Sex: October 07, 1960 (61 y.o. M) ?Treating RN: Carlene Coria ?Primary Care Brandey Vandalen: Drema Dallas Other Clinician: ?Referring Babacar Haycraft: Caroline More ?Treating Maxton Noreen/Extender: Jeri Cos ?Weeks in Treatment: 0 ?Abuse Risk Screen Items ?Answer ?ABUSE RISK SCREEN: ?Has anyone close to you tried to hurt or harm you recentlyo No ?Do you feel uncomfortable with anyone in your familyo No ?Has anyone forced you do things that you didnot want to doo No ?Electronic Signature(s) ?Signed: 07/15/2021 4:01:37 PM By: Carlene Coria RN ?Entered ByCarlene Coria on 07/15/2021 09:24:55 ?Charles [name], [name] ([id number]) ?-------------------------------------------------------------------------------- ?Activities of Daily Living Details ?Patient Name: Charles [name], Charles [name] ?Date of Service: 07/15/2021 8:45 AM ?Medical Record Number: [id number] ?Patient Account Number: [id number] ?Date of Birth/Sex: Oct 30, 1960 (61 y.o. M) ?Treating RN: Carlene Coria ?Primary Care Tzirel Leonor: Drema Dallas Other Clinician: ?Referring Dezirea Mccollister: Caroline More ?Treating Sheryle Vice/Extender: Jeri Cos ?Weeks in Treatment: 0 ?Activities of Daily Living Items ?Answer ?Activities of Daily Living (Please select one for each item) ?[place] ?Take Medications Completely Able ?Use Telephone Completely Able ?Care for Appearance Completely Able ?Use Toilet Completely Able ?Bath / Shower Completely Able ?Dress Self Completely Able ?Feed Self Completely Able ?Walk Completely Able ?Get In / Out Bed Completely Able ?Housework Completely Able ?Prepare Meals Completely Able ?Handle Money Completely Able ?Shop for Self Completely Able ?Electronic Signature(s) ?Signed: 07/15/2021 4:01:37 PM By: Carlene Coria RN ?Entered ByCarlene Coria on 07/15/2021 09:25:21 ?Charles [name], [name] ([id number]) ?-------------------------------------------------------------------------------- ?Education Screening Details ?Patient Name: Charles [name], Charles [name] ?Date of Service: 07/15/2021 8:45 AM ?Medical Record Number: [id number] ?Patient Account Number: [id number] ?Date of Birth/Sex: [birth year] (61 y.o. M) ?Treating RN: Carlene Coria ?Primary Care Jacquelynn Friend: Drema Dallas Other Clinician: ?Referring Smitty Ackerley: Caroline More ?Treating Link Burgeson/Extender: Jeri Cos ?Weeks in Treatment: 0 ?Primary Learner Assessed: Patient ?Learning Preferences/Education Level/Primary Language ?Learning Preference: Explanation ?Highest Education Level: College or Above ?Preferred Language: English ?Cognitive Barrier ?Language Barrier: No ?Translator Needed: No ?Memory Deficit: No ?Emotional Barrier: No ?Cultural/Religious Beliefs Affecting Medical Care: No ?Physical Barrier ?Impaired Vision: Yes Glasses ?Knowledge/Comprehension ?Knowledge Level: Medium ?Comprehension Level: Medium ?Ability to understand written instructions: High ?Ability to understand verbal instructions: High ?Motivation ?Anxiety Level: Anxious ?Cooperation: Cooperative ?Education Importance: Acknowledges Need ?Interest in Health Problems: Asks Questions ?Willingness to Engage in Self-Management ?Medium ?Activities: ?Readiness to Engage in Self-Management ?High ?Activities: ?Electronic Signature(s) ?Signed: 07/15/2021 4:01:37 PM By: Carlene Coria RN ?Entered By: Carlene Coria on 07/15/2021 09:26:15 ?Gazzola, Mitchelle S. ([id number]) ?-------------------------------------------------------------------------------- ?Fall Risk Assessment Details ?Patient Name: Charles [name], Charles [name] ?Date of Service: 07/15/2021 8:45 AM ?Medical Record Number: [id number] ?Patient Account Number: [id number] ?Date of Birth/Sex: 1960/05/16 (61 y.o. M) ?Treating RN: Carlene Coria ?Primary Care Zarah Carbon: Drema Dallas Other Clinician: ?Referring  Shawndell Schillaci: Caroline More ?Treating Shazia Mitchener/Extender: Jeri Cos ?Weeks in Treatment: 0 ?Fall Risk Assessment Items ?Have you had 2 or more falls in the last 12 monthso 0 No ?Have you had any fall that resulted in injury in the last 12 monthso 0 No ?FALLS RISK SCREEN ?History of falling - immediate or within 3 months 0 No ?Secondary diagnosis (Do you have 2 or more medical diagnoseso) 0 No ?Ambulatory aid ?None/bed rest/wheelchair/nurse 0 No ?Crutches/cane/walker 0 No ?Furniture 0 No ?Intravenous therapy Access/Saline/Heparin Lock 0 No ?Gait/Transferring ?Normal/ bed rest/ wheelchair 0 No ?Weak (short steps with or without shuffle, stooped but able to lift  head while walking, may ?0 No ?seek support from furniture) ?Impaired (short steps with shuffle, may have difficulty arising from chair, head down, impaired ?0 No ?balance) ?Mental Status ?Oriented to own ability 0 No ?Electronic Signature(s) ?Signed: 07/15/2021 4:01:37 PM By: Carlene Coria RN ?Entered ByCarlene Coria on 07/15/2021 89:21:19 ?Charles [name], [name] ([id number]) ?-------------------------------------------------------------------------------- ?Foot Assessment Details ?Patient Name: Charles [name], Charles [name] ?Date of Service: 07/15/2021 8:45 AM ?Medical Record Number: [id number] ?Patient Account Number: [id number] ?Date of Birth/Sex: 1961/04/06 (61 y.o. M) ?Treating RN: Carlene Coria ?Primary Care Juanette Urizar: Drema Dallas Other Clinician: ?Referring Conard Alvira: Caroline More ?Treating Miia Blanks/Extender: Jeri Cos ?Weeks in Treatment: 0 ?Foot Assessment Items ?'[x]'$  Unable to perform right foot assessment due to amputation ?'[x]'$  Unable to perform left foot assessment due to amputation ?Site Locations ?+ = Sensation present, - = Sensation absent, C = Callus, U = Ulcer ?R = Redness, W = Warmth, M = Maceration, PU = Pre-ulcerative lesion ?F = Fissure, S = Swelling, D = Dryness ?Assessment ?Right: Left: ?Other Deformity: ?Prior Foot Ulcer: ?Prior Amputation: ?[place]  Joint: ?Ambulatory Status: ?Gait: ?Electronic Signature(s) ?Signed: 07/15/2021 4:01:37 PM By: Carlene Coria RN ?Entered ByCarlene Coria on 07/15/2021 09:34:18 ?Charles [name], [name] ([id number]) ?-------------------------------------------------------------------------------- ?Nutrition Risk Screening Details ?Patient Name: Charles [name], Charles [name] ?Date of Service: 07/15/2021 8:45 AM ?Medical Record Number: [id number] ?Patient Account Number: [id number] ?Date of Birth/Sex: 09/02/1960 (61 y.o. M) ?Treating RN: Carlene Coria ?Primary Care Rital Cavey: Drema Dallas Other Clinician: ?Referring Naftoli Penny: Caroline More ?Treating Cotina Freedman/Extender: Jeri Cos ?Weeks in Treatment: 0 ?Height (in): 73 ?Weight (lbs): 230 ?Body Mass Index (BMI): 30.3 ?Nutrition Risk Screening Items ?Score Screening ?NUTRITION RISK SCREEN: ?I have an illness or condition that made me change the kind and/or amount of food I eat 0 No ?I eat fewer than two meals per day 0 No ?I eat few fruits and vegetables, or milk products 0 No ?I have three or more drinks of beer, liquor or wine almost every day 0 No ?I have tooth or mouth problems that make it hard for me to eat 0 No ?I don't always have enough money to buy the food I need 0 No ?I eat alone most of the time 0 No ?I take three or more different prescribed or over-the-counter drugs a day 1 Yes ?Without wanting to, I have lost or gained 10 pounds in the last six months 0 No ?I am not always physically able to shop, cook and/or feed myself 0 No ?Nutrition Protocols ?Good Risk Protocol 0 No interventions needed ?Moderate Risk Protocol ?High Risk Proctocol ?Risk Level: Good Risk ?Score: 1 ?Electronic Signature(s) ?Signed: 07/15/2021 4:01:37 PM By: Carlene Coria RN ?Entered ByCarlene Coria on 07/15/2021 09:26:33 ?

## 2021-07-15 NOTE — Progress Notes (Addendum)
MENASHE, KAFER. ([id number]) ?Visit Report for 07/15/2021 ?Chief Complaint Document Details ?Patient Name: Charles [name], Charles [name] ?Date of Service: 07/15/2021 8:45 AM ?Medical Record Number: [id number] ?Patient Account Number: [id number] ?Date of Birth/Sex: 08-06-1960 (61 y.o. M) ?Treating RN: Carlene Coria ?Primary Care Provider: Drema Dallas Other Clinician: ?Referring Provider: Caroline More ?Treating Provider/Extender: Jeri Cos ?Weeks in Treatment: 0 ?Information Obtained from: Patient ?Chief Complaint ?Left foot open wound at the great toe amputation site - Workers Comp Injury ?Left lateral foot and right plantar foot ulcer - NON-Workers Comp Ulcers ?Electronic Signature(s) ?Signed: 07/18/2021 4:31:28 PM By: Worthy Keeler PA-C ?Previous Signature: 07/15/2021 9:55:36 AM Version By: Worthy Keeler PA-C ?Entered By: Worthy Keeler on 07/18/2021 16:31:27 ?Charles [name], [name] ([id number]) ?-------------------------------------------------------------------------------- ?Debridement Details ?Patient Name: Charles [name], Charles [name] ?Date of Service: 07/15/2021 8:45 AM ?Medical Record Number: [id number] ?Patient Account Number: [id number] ?Date of Birth/Sex: June 05, 1960 (61 y.o. M) ?Treating RN: Carlene Coria ?Primary Care Provider: Drema Dallas Other Clinician: ?Referring Provider: Caroline More ?Treating Provider/Extender: Jeri Cos ?Weeks in Treatment: 0 ?Debridement Performed for ?Wound #3 Left Metatarsal head fifth ?Assessment: ?Performed By: Physician Tommie Sams., PA-C ?Debridement Type: Debridement ?Severity of Tissue Pre Debridement: Fat layer exposed ?Level of Consciousness (Pre- ?Awake and Alert ?procedure): ?Pre-procedure Verification/Time Out ?Yes - 09:58 ?Taken: ?Start Time: 09:58 ?Pain Control: Lidocaine 4% Topical Solution ?Total Area Debrided (L x W): 1.8 (cm) x 2 (cm) = 3.6 (cm?) ?Tissue and other material ?Viable, Non-Viable, Slough, Subcutaneous, [place] ?debrided: ?Level: Skin/Subcutaneous  Tissue ?Debridement Description: Excisional ?Instrument: Curette ?Bleeding: Moderate ?Hemostasis Achieved: Silver Nitrate ?End Time: 10:02 ?Procedural Pain: 0 ?Post Procedural Pain: 0 ?Response to Treatment: Procedure was tolerated well ?Level of Consciousness (Post- ?Awake and Alert ?procedure): ?Post Debridement Measurements of Total Wound ?Length: (cm) 1.8 ?Width: (cm) 2 ?Depth: (cm) 0.3 ?Volume: (cm?) 0.848 ?Character of Wound/Ulcer Post Debridement: Improved ?Severity of Tissue Post Debridement: Fat layer exposed ?Post Procedure Diagnosis ?Same as Pre-procedure ?Electronic Signature(s) ?Signed: 07/18/2021 3:54:18 PM By: Carlene Coria RN ?Signed: 07/18/2021 4:34:33 PM By: Worthy Keeler PA-C ?Entered By: Carlene Coria on 07/18/2021 15:54:17 ?Charles [name], [name] ([id number]) ?-------------------------------------------------------------------------------- ?Debridement Details ?Patient Name: Charles [name], Charles [name] ?Date of Service: 07/15/2021 8:45 AM ?Medical Record Number: [id number] ?Patient Account Number: [id number] ?Date of Birth/Sex: Mar 01, 1961 (61 y.o. M) ?Treating RN: Carlene Coria ?Primary Care Provider: Drema Dallas Other Clinician: ?Referring Provider: Caroline More ?Treating Provider/Extender: Jeri Cos ?Weeks in Treatment: 0 ?Debridement Performed for ?Wound #1 Left Amputation Site - Toe ?Assessment: ?Performed By: Physician Tommie Sams., PA-C ?Debridement Type: Debridement ?Level of Consciousness (Pre- ?Awake and Alert ?procedure): ?Pre-procedure Verification/Time Out ?Yes - 09:58 ?Taken: ?Start Time: 09:58 ?Pain Control: Lidocaine 4% Topical Solution ?Total Area Debrided (L x W): 1 (cm) x 2.4 (cm) = 2.4 (cm?) ?Tissue and other material ?Viable, Non-Viable, Slough, Subcutaneous, [place] ?debrided: ?Level: Skin/Subcutaneous Tissue ?Debridement Description: Excisional ?Instrument: Curette ?Bleeding: Minimum ?Hemostasis Achieved: Pressure ?End Time: 10:02 ?Procedural Pain: 0 ?Post Procedural Pain: 0 ?Response  to Treatment: Procedure was tolerated well ?Level of Consciousness (Post- ?Awake and Alert ?procedure): ?Post Debridement Measurements of Total Wound ?Length: (cm) 1 ?Width: (cm) 2.4 ?Depth: (cm) 0.3 ?Volume: (cm?) 0.565 ?Character of Wound/Ulcer Post Debridement: Improved ?Post Procedure Diagnosis ?Same as Pre-procedure ?Electronic Signature(s) ?Signed: 07/18/2021 3:59:29 PM By: Carlene Coria RN ?Signed: 07/18/2021 4:34:33 PM By: Worthy Keeler PA-C ?Previous Signature: 07/15/2021 4:01:37 PM Version By: Carlene Coria RN ?Previous Signature: 07/15/2021 4:21:45 PM Version By: Worthy Keeler PA-C ?Entered By: Carlene Coria on 07/18/2021 15:59:29 ?  Charles [name], [name] ([id number]) ?-------------------------------------------------------------------------------- ?Debridement Details ?Patient Name: Charles [name], Charles [name] ?Date of Service: 07/15/2021 8:45 AM ?Medical Record Number: [id number] ?Patient Account Number: [id number] ?Date of Birth/Sex: 1961-01-12 (61 y.o. M) ?Treating RN: Carlene Coria ?Primary Care Provider: Drema Dallas Other Clinician: ?Referring Provider: Caroline More ?Treating Provider/Extender: Jeri Cos ?Weeks in Treatment: 0 ?Debridement Performed for ?Wound #2 Right,Plantar Foot ?Assessment: ?Performed By: Physician Tommie Sams., PA-C ?Debridement Type: Debridement ?Severity of Tissue Pre Debridement: Fat layer exposed ?Level of Consciousness (Pre- ?Awake and Alert ?procedure): ?Pre-procedure Verification/Time Out ?Yes - 09:58 ?Taken: ?Start Time: 09:58 ?Pain Control: Lidocaine 4% Topical Solution ?Total Area Debrided (L x W): 0.4 (cm) x 0.6 (cm) = 0.24 (cm?) ?Tissue and other material ?Viable, Non-Viable, Slough, Subcutaneous, [place] ?debrided: ?Level: Skin/Subcutaneous Tissue ?Debridement Description: Excisional ?Instrument: Curette ?Bleeding: Minimum ?Hemostasis Achieved: Pressure ?End Time: 10:02 ?Procedural Pain: 0 ?Post Procedural Pain: 0 ?Response to Treatment: Procedure was tolerated well ?Level of  Consciousness (Post- ?Awake and Alert ?procedure): ?Post Debridement Measurements of Total Wound ?Length: (cm) 0.4 ?Width: (cm) 0.6 ?Depth: (cm) 0.4 ?Volume: (cm?) 0.075 ?Character of Wound/Ulcer Post Debridement: Improved ?Severity of Tissue Post Debridement: Fat layer exposed ?Post Procedure Diagnosis ?Same as Pre-procedure ?Electronic Signature(s) ?Signed: 07/18/2021 4:00:47 PM By: Carlene Coria RN ?Signed: 07/18/2021 4:34:33 PM By: Worthy Keeler PA-C ?Previous Signature: 07/18/2021 3:53:23 PM Version By: Carlene Coria RN ?Entered By: Carlene Coria on 07/18/2021 16:00:47 ?Charles [name], Charles Ankur S. ([id number]) ?-------------------------------------------------------------------------------- ?HPI Details ?Patient Name: Charles [name], Charles [name] ?Date of Service: 07/15/2021 8:45 AM ?Medical Record Number: [id number] ?Patient Account Number: [id number] ?Date of Birth/Sex: May 28, 1960 (61 y.o. M) ?Treating RN: Carlene Coria ?Primary Care Provider: Drema Dallas Other Clinician: ?Referring Provider: Caroline More ?Treating Provider/Extender: Jeri Cos ?Weeks in Treatment: 0 ?History of Present Illness ?HPI Description: 5.7 diet controlled ?Workers Compensation Chart: ?07/15/2021 upon evaluation today patient appears to be doing decently well in regard to a wound on the left foot first great toe amputation site. ?This is a wound that has been taken care of by Dr. Luana Shu up to this point at podiatry. He also has been seeing the patient for wounds that are ?non-Worker's Comp. related that will be dictated in a another note to this is actually the [employer] note currently. The patient sustained this ?injury when he was at work and apparently there was a machine that you stood beside that would come down to slice something that apparently ?his great toe got caught in the machine and removed a portion of this and ended up requiring a amputation of the digit completely. Subsequently ?this had for the most part healed but there was a small  dehiscence and its not closed completely. Again this is the [employer] portion of his ?issues going on at this point. The good news is there does not appear to be any signs of infection right now which is great news

## 2021-07-15 NOTE — Progress Notes (Addendum)
Charles, [name]. ([id number]) ?Visit Report for 07/15/2021 ?Allergy List Details ?Patient Name: Charles [name], Charles [name] ?Date of Service: 07/15/2021 8:45 AM ?Medical Record Number: [id number] ?Patient Account Number: [id number] ?Date of Birth/Sex: [birth year] (61 y.o. M) ?Treating RN: Carlene Coria ?Primary Care Thresa Dozier: Drema Dallas Other Clinician: ?Referring Pascale Maves: Caroline More ?Treating Sherral Dirocco/Extender: Jeri Cos ?Weeks in Treatment: 0 ?Allergies ?Active Allergies ?lisinopril ?Allergy Notes ?Electronic Signature(s) ?Signed: 07/15/2021 4:01:37 PM By: Carlene Coria RN ?Entered By: Carlene Coria on 07/15/2021 09:22:17 ?GILLEN, Hykeem S. ([id number]) ?-------------------------------------------------------------------------------- ?Arrival Information Details ?Patient Name: Charles, [name] ?Date of Service: 07/15/2021 8:45 AM ?Medical Record Number: [id number] ?Patient Account Number: [id number] ?Date of Birth/Sex: September 20, 1960 (61 y.o. M) ?Treating RN: Carlene Coria ?Primary Care Amol Domanski: Drema Dallas Other Clinician: ?Referring Shaylee Stanislawski: Caroline More ?Treating Chenelle Benning/Extender: Jeri Cos ?Weeks in Treatment: 0 ?Visit Information ?Patient Arrived: Ambulatory ?Arrival Time: 09:19 ?Accompanied By: self ?Transfer Assistance: None ?Patient Identification Verified: Yes ?Secondary Verification Process Completed: Yes ?Patient Requires Transmission-Based Precautions: No ?Patient Has Alerts: Yes ?Patient Alerts: DIABETIC ?Electronic Signature(s) ?Signed: 07/15/2021 4:01:37 PM By: Carlene Coria RN ?Entered By: Carlene Coria on 07/15/2021 09:20:05 ?FEUTZ, Nate S. ([id number]) ?-------------------------------------------------------------------------------- ?Clinic Level of Care Assessment Details ?Patient Name: Charles, [name] ?Date of Service: 07/15/2021 8:45 AM ?Medical Record Number: [id number] ?Patient Account Number: [id number] ?Date of Birth/Sex: [birth year] (61 y.o. M) ?Treating RN: Carlene Coria ?Primary Care  Shyonna Carlin: Drema Dallas Other Clinician: ?Referring Olvin Rohr: Caroline More ?Treating Bhavya Eschete/Extender: Jeri Cos ?Weeks in Treatment: 0 ?Clinic Level of Care Assessment Items ?TOOL 1 Quantity Score ?X - Use when EandM and Procedure is performed on INITIAL visit 1 0 ?ASSESSMENTS - Nursing Assessment / Reassessment ?X - General Physical Exam (combine w/ comprehensive assessment (listed just below) when performed on new ?1 20 ?pt. evals) ?X- 1 25 ?Comprehensive Assessment (HX, ROS, Risk Assessments, Wounds Hx, etc.) ?ASSESSMENTS - Wound and Skin Assessment / Reassessment ?'[]'$  - Dermatologic / Skin Assessment (not related to wound area) 0 ?ASSESSMENTS - Ostomy and/or Continence Assessment and Care ?'[]'$  - Incontinence Assessment and Management 0 ?'[]'$  - 0 ?Ostomy Care Assessment and Management (repouching, etc.) ?PROCESS - Coordination of Care ?X - Simple Patient / Family Education for ongoing care 1 15 ?'[]'$  - 0 ?Complex (extensive) Patient / Family Education for ongoing care ?X- 1 10 ?Staff obtains Consents, Records, Test Results / Process Orders ?'[]'$  - 0 ?Staff telephones HHA, Nursing Homes / Clarify orders / etc ?'[]'$  - 0 ?Routine Transfer to another Facility (non-emergent condition) ?'[]'$  - 0 ?Routine Hospital Admission (non-emergent condition) ?X- 1 15 ?New Admissions / Biomedical engineer / Ordering NPWT, Apligraf, etc. ?'[]'$  - 0 ?Emergency Hospital Admission (emergent condition) ?PROCESS - Special Needs ?'[]'$  - Pediatric / Minor Patient Management 0 ?'[]'$  - 0 ?Isolation Patient Management ?'[]'$  - 0 ?Hearing / Language / Visual special needs ?'[]'$  - 0 ?Assessment of Community assistance (transportation, D/C planning, etc.) ?'[]'$  - 0 ?Additional assistance / Altered mentation ?'[]'$  - 0 ?Support Surface(s) Assessment (bed, cushion, seat, etc.) ?INTERVENTIONS - Miscellaneous ?'[]'$  - External ear exam 0 ?'[]'$  - 0 ?Patient Transfer (multiple staff / Civil Service fast streamer / Similar devices) ?'[]'$  - 0 ?Simple Staple / Suture removal (25 or less) ?'[]'$   - 0 ?Complex Staple / Suture removal (26 or more) ?'[]'$  - 0 ?Hypo/Hyperglycemic Management (do not check if billed separately) ?X- 1 15 ?Ankle / Brachial Index (ABI) - do not check if billed separately ?Has the patient been seen at the hospital within the last three years: Yes ?Total Score: 100 ?Level Of  Care: New/Established - Level ?3 ?VENCE, LALOR. ([id number]) ?Electronic Signature(s) ?Signed: 07/15/2021 4:01:37 PM By: Carlene Coria RN ?Entered ByCarlene Coria on 07/15/2021 10:09:29 ?MCCAMISH, Philopateer S. ([id number]) ?-------------------------------------------------------------------------------- ?Encounter Discharge Information Details ?Patient Name: Charles, [name] ?Date of Service: 07/15/2021 8:45 AM ?Medical Record Number: [id number] ?Patient Account Number: [id number] ?Date of Birth/Sex: [birth year] (61 y.o. M) ?Treating RN: Carlene Coria ?Primary Care Sanjiv Castorena: Drema Dallas Other Clinician: ?Referring Holy Battenfield: Caroline More ?Treating Jalene Demo/Extender: Jeri Cos ?Weeks in Treatment: 0 ?Encounter Discharge Information Items Post Procedure Vitals ?Discharge Condition: Stable ?Temperature (?F): 98.4 ?Ambulatory Status: Ambulatory ?Pulse (bpm): 88 ?Discharge Destination: Home ?Respiratory Rate (breaths/min): 18 ?Transportation: Private Auto ?Blood Pressure (mmHg): 168/78 ?Accompanied By: self ?Schedule Follow-up Appointment: Yes ?Clinical Summary of Care: Patient Declined ?Electronic Signature(s) ?Signed: 07/18/2021 3:56:50 PM By: Carlene Coria RN ?Previous Signature: 07/15/2021 4:01:37 PM Version By: Carlene Coria RN ?Entered By: Carlene Coria on 07/18/2021 15:56:50 ?Deremer, Jonh S. ([id number]) ?-------------------------------------------------------------------------------- ?Lower Extremity Assessment Details ?Patient Name: Charles, [name] ?Date of Service: 07/15/2021 8:45 AM ?Medical Record Number: [id number] ?Patient Account Number: [id number] ?Date of Birth/Sex: Oct 27, 1960 (61 y.o. M) ?Treating RN: Carlene Coria ?Primary Care Ricco Dershem: Drema Dallas Other Clinician: ?Referring Charlotta Lapaglia: Caroline More ?Treating Jacobie Stamey/Extender: Jeri Cos ?Weeks in Treatment: 0 ?Edema Assessment ?Assessed: [Left: No] [Right: No] ?Edema: [Left: Yes] [Right: Yes] ?Calf ?Left: Right: ?Point of Measurement: 38 cm From Medial Instep 41 cm 37 cm ?Ankle ?Left: Right: ?Point of Measurement: 10 cm From Medial Instep 30 cm 24 cm ?Knee To Floor ?Left: Right: ?From Medial Instep 50 cm 50 cm ?Vascular Assessment ?Pulses: ?Dorsalis Pedis ?Palpable: [Left:Yes] [Right:Yes] ?Doppler Audible: [Left:Yes] [Right:Yes] ?Blood Pressure: ?Brachial: [Left:168] [Right:168] ?Ankle: ?[Left:Dorsalis Pedis: 180 1.07] ?[Right:Dorsalis Pedis: 190 1.13] ?Electronic Signature(s) ?Signed: 07/15/2021 4:01:37 PM By: Carlene Coria RN ?Entered ByCarlene Coria on 07/15/2021 09:35:57 ?ROMIG, Jarious S. ([id number]) ?-------------------------------------------------------------------------------- ?Multi Wound Chart Details ?Patient Name: CORWYN, VORA ?Date of Service: 07/15/2021 8:45 AM ?Medical Record Number: [id number] ?Patient Account Number: [id number] ?Date of Birth/Sex: [birth year] (61 y.o. M) ?Treating RN: Carlene Coria ?Primary Care Havish Petties: Drema Dallas Other Clinician: ?Referring Munirah Doerner: Caroline More ?Treating Shakerria Parran/Extender: Jeri Cos ?Weeks in Treatment: 0 ?Vital Signs ?Height(in): 73 ?Pulse(bpm): 88 ?Weight(lbs): 230 ?Blood Pressure(mmHg): 168/78 ?Body Mass Index(BMI): 30.3 ?Temperature(??F): 98.4 ?Respiratory Rate(breaths/min): 18 ?Photos: [N/A:N/A] ?Wound Location: Left Amputation Site - Toe N/A N/A ?Wounding Event: Trauma N/A N/A ?Primary Etiology: Dehisced Wound N/A N/A ?Comorbid History: Coronary Artery Disease, N/A N/A ?Hypertension, Type II Diabetes ?Date Acquired: 05/07/2021 N/A N/A ?Weeks of Treatment: 0 N/A N/A ?Wound Status: Open N/A N/A ?Wound Recurrence: No N/A N/A ?Measurements L x W x D (cm) 1x2.4x0.3 N/A N/A ?Area (cm?) : 1.885  N/A N/A ?Volume (cm?) : 0.565 N/A N/A ?Classification: Full Thickness Without Exposed N/A N/A ?Support Structures ?Exudate Amount: Medium N/A N/A ?Exudate Type: Serosanguineous N/A N/A ?Exudate Color: red, bro

## 2021-07-24 ENCOUNTER — Encounter: Payer: Managed Care, Other (non HMO) | Attending: Physician Assistant | Admitting: Physician Assistant

## 2021-07-24 ENCOUNTER — Encounter: Payer: No Typology Code available for payment source | Attending: Physician Assistant | Admitting: Physician Assistant

## 2021-07-24 DIAGNOSIS — L97522 Non-pressure chronic ulcer of other part of left foot with fat layer exposed: Secondary | ICD-10-CM | POA: Insufficient documentation

## 2021-07-24 DIAGNOSIS — Z89412 Acquired absence of left great toe: Secondary | ICD-10-CM | POA: Insufficient documentation

## 2021-07-24 DIAGNOSIS — E11621 Type 2 diabetes mellitus with foot ulcer: Secondary | ICD-10-CM | POA: Insufficient documentation

## 2021-07-24 DIAGNOSIS — L97512 Non-pressure chronic ulcer of other part of right foot with fat layer exposed: Secondary | ICD-10-CM | POA: Insufficient documentation

## 2021-07-24 DIAGNOSIS — I2510 Atherosclerotic heart disease of native coronary artery without angina pectoris: Secondary | ICD-10-CM | POA: Diagnosis not present

## 2021-07-24 DIAGNOSIS — I10 Essential (primary) hypertension: Secondary | ICD-10-CM | POA: Insufficient documentation

## 2021-07-24 NOTE — Progress Notes (Addendum)
FRANCES, JOYNT. ([id number]) ?Visit Report for 07/24/2021 ?Arrival Information Details ?Patient Name: Charles [name], Charles [name] ?Date of Service: 07/24/2021 8:00 AM ?Medical Record Number: [id number] ?Patient Account Number: [id number] ?Date of Birth/Sex: 02-21-1961 (61 y.o. Male) ?Treating RN: Carlene Coria ?Primary Care Keeara Frees: Drema Dallas Other Clinician: ?Referring Mckaylie Vasey: Caroline More ?Treating Philipp Callegari/Extender: Jeri Cos ?Weeks in Treatment: 1 ?Visit Information History Since Last Visit ?All ordered tests and consults were completed: No ?Patient Arrived: Ambulatory ?Added or deleted any medications: No ?Arrival Time: 08:09 ?Any new allergies or adverse reactions: No ?Accompanied By: self ?Had a fall or experienced change in No ?Transfer Assistance: None ?activities of daily living that may affect ?Patient Identification Verified: Yes ?risk of falls: ?Secondary Verification Process Completed: Yes ?Signs or symptoms of abuse/neglect since last visito No ?Patient Requires Transmission-Based Precautions: No ?Hospitalized since last visit: No ?Patient Has Alerts: Yes ?Implantable device outside of the clinic excluding No ?Patient Alerts: DIABETIC ?cellular tissue based products placed in the center ?since last visit: ?Has Dressing in Place as Prescribed: Yes ?Pain Present Now: Yes ?Electronic Signature(s) ?Signed: 07/24/2021 9:23:23 AM By: Carlene Coria RN ?Entered By: Carlene Coria on 07/24/2021 08:15:58 ?Charles [name], [name] ([id number]) ?-------------------------------------------------------------------------------- ?Clinic Level of Care Assessment Details ?Patient Name: Charles [name], Charles [name] ?Date of Service: 07/24/2021 8:00 AM ?Medical Record Number: [id number] ?Patient Account Number: [id number] ?Date of Birth/Sex: 1961-02-06 (61 y.o. Male) ?Treating RN: Carlene Coria ?Primary Care Gadge Hermiz: Drema Dallas Other Clinician: ?Referring Shilah Hefel: Caroline More ?Treating Kmya Placide/Extender: Jeri Cos ?Weeks in Treatment:  1 ?Clinic Level of Care Assessment Items ?TOOL 1 Quantity Score ?'[]'$  - Use when EandM and Procedure is performed on INITIAL visit 0 ?ASSESSMENTS - Nursing Assessment / Reassessment ?'[]'$  - General Physical Exam (combine w/ comprehensive assessment (listed just below) when performed on new ?0 ?pt. evals) ?'[]'$  - 0 ?Comprehensive Assessment (HX, ROS, Risk Assessments, Wounds Hx, etc.) ?ASSESSMENTS - Wound and Skin Assessment / Reassessment ?'[]'$  - Dermatologic / Skin Assessment (not related to wound area) 0 ?ASSESSMENTS - Ostomy and/or Continence Assessment and Care ?'[]'$  - Incontinence Assessment and Management 0 ?'[]'$  - 0 ?Ostomy Care Assessment and Management (repouching, etc.) ?PROCESS - Coordination of Care ?'[]'$  - Simple Patient / Family Education for ongoing care 0 ?'[]'$  - 0 ?Complex (extensive) Patient / Family Education for ongoing care ?'[]'$  - 0 ?Staff obtains Consents, Records, Test Results / Process Orders ?'[]'$  - 0 ?Staff telephones HHA, Nursing Homes / Clarify orders / etc ?'[]'$  - 0 ?Routine Transfer to another Facility (non-emergent condition) ?'[]'$  - 0 ?Routine Hospital Admission (non-emergent condition) ?'[]'$  - 0 ?New Admissions / Biomedical engineer / Ordering NPWT, Apligraf, etc. ?'[]'$  - 0 ?Emergency Hospital Admission (emergent condition) ?PROCESS - Special Needs ?'[]'$  - Pediatric / Minor Patient Management 0 ?'[]'$  - 0 ?Isolation Patient Management ?'[]'$  - 0 ?Hearing / Language / Visual special needs ?'[]'$  - 0 ?Assessment of Community assistance (transportation, D/C planning, etc.) ?'[]'$  - 0 ?Additional assistance / Altered mentation ?'[]'$  - 0 ?Support Surface(s) Assessment (bed, cushion, seat, etc.) ?INTERVENTIONS - Miscellaneous ?'[]'$  - External ear exam 0 ?'[]'$  - 0 ?Patient Transfer (multiple staff / Civil Service fast streamer / Similar devices) ?'[]'$  - 0 ?Simple Staple / Suture removal (25 or less) ?'[]'$  - 0 ?Complex Staple / Suture removal (26 or more) ?'[]'$  - 0 ?Hypo/Hyperglycemic Management (do not check if billed separately) ?'[]'$  - 0 ?Ankle /  Brachial Index (ABI) - do not check if billed separately ?Has the patient been seen at the hospital within the last three years: Yes ?Total  Score: 0 ?Level Of Care: ____ [name] ([id number]) ?Electronic Signature(s) ?Signed: 07/24/2021 9:23:23 AM By: Carlene Coria RN ?Entered ByCarlene Coria on 07/24/2021 08:38:58 ?Charles [name], [name] ([id number]) ?-------------------------------------------------------------------------------- ?Encounter Discharge Information Details ?Patient Name: Charles [name], Charles [name] ?Date of Service: 07/24/2021 8:00 AM ?Medical Record Number: [id number] ?Patient Account Number: [id number] ?Date of Birth/Sex: 1960-07-24 (61 y.o. Male) ?Treating RN: Carlene Coria ?Primary Care Jamond Neels: Drema Dallas Other Clinician: ?Referring Aileana Hodder: Caroline More ?Treating Skylier Kretschmer/Extender: Jeri Cos ?Weeks in Treatment: 1 ?Encounter Discharge Information Items Post Procedure Vitals ?Discharge Condition: Stable ?Temperature (?F): 97.8 ?Ambulatory Status: Ambulatory ?Pulse (bpm): 67 ?Discharge Destination: Home ?Respiratory Rate (breaths/min): 18 ?Transportation: Private Auto ?Blood Pressure (mmHg): 126/77 ?Accompanied By: self ?Schedule Follow-up Appointment: Yes ?Clinical Summary of Care: Patient Declined ?Electronic Signature(s) ?Signed: 07/24/2021 9:23:23 AM By: Carlene Coria RN ?Entered By: Carlene Coria on 07/24/2021 08:40:52 ?Charles [name], [name] ([id number]) ?-------------------------------------------------------------------------------- ?Lower Extremity Assessment Details ?Patient Name: Charles [name], Charles [name] ?Date of Service: 07/24/2021 8:00 AM ?Medical Record Number: [id number] ?Patient Account Number: [id number] ?Date of Birth/Sex: [birth year] (61 y.o. Male) ?Treating RN: Carlene Coria ?Primary Care Treonna Klee: Drema Dallas Other Clinician: ?Referring Meliton Samad: Caroline More ?Treating Lendora Keys/Extender: Jeri Cos ?Weeks in Treatment: 1 ?Edema Assessment ?Assessed: [Left: No] [Right: No] ?Edema: [Left: Yes]  [Right: Yes] ?Calf ?Left: Right: ?Point of Measurement: 38 cm From Medial Instep 42 cm 38 cm ?Ankle ?Left: Right: ?Point of Measurement: 10 cm From Medial Instep 31 cm 23 cm ?Knee To Floor ?Left: Right: ?From Medial Instep 50 cm 50 cm ?Vascular Assessment ?Pulses: ?Dorsalis Pedis ?Palpable: [Left:Yes] [Right:Yes] ?Electronic Signature(s) ?Signed: 07/24/2021 9:23:23 AM By: Carlene Coria RN ?Entered By: Carlene Coria on 07/24/2021 08:23:08 ?Charles [name], [name] ([id number]) ?-------------------------------------------------------------------------------- ?Multi Wound Chart Details ?Patient Name: Charles [name], Charles [name] ?Date of Service: 07/24/2021 8:00 AM ?Medical Record Number: [id number] ?Patient Account Number: [id number] ?Date of Birth/Sex: [birth year] (61 y.o. Male) ?Treating RN: Carlene Coria ?Primary Care Jams Trickett: Drema Dallas Other Clinician: ?Referring Olia Hinderliter: Caroline More ?Treating Damarius Karnes/Extender: Jeri Cos ?Weeks in Treatment: 1 ?Vital Signs ?Height(in): 73 ?Pulse(bpm): 67 ?Weight(lbs): 230 ?Blood Pressure(mmHg): 126/77 ?Body Mass Index(BMI): 30.3 ?Temperature(??F): 97.8 ?Respiratory Rate(breaths/min): 18 ?Photos: [N/A:N/A] ?Wound Location: Left Amputation Site - Toe N/A N/A ?Wounding Event: Trauma N/A N/A ?Primary Etiology: Dehisced Wound N/A N/A ?Comorbid History: Coronary Artery Disease, N/A N/A ?Hypertension, Type II Diabetes ?Date Acquired: 05/07/2021 N/A N/A ?Weeks of Treatment: 1 N/A N/A ?Wound Status: Open N/A N/A ?Wound Recurrence: No N/A N/A ?Measurements L x W x D (cm) 0.7x3x0.3 N/A N/A ?Area (cm?) : 1.649 N/A N/A ?Volume (cm?) : 0.495 N/A N/A ?% Reduction in Area: 12.50% N/A N/A ?% Reduction in Volume: 12.40% N/A N/A ?Classification: Full Thickness Without Exposed N/A N/A ?Support Structures ?Exudate Amount: Medium N/A N/A ?Exudate Type: Serosanguineous N/A N/A ?Exudate Color: red, brown N/A N/A ?Granulation Amount: Medium (34-66%) N/A N/A ?Granulation Quality: Red, Pink N/A N/A ?Necrotic Amount:  Medium (34-66%) N/A N/A ?Exposed Structures: ?Fat Layer (Subcutaneous Tissue): N/A N/A ?Yes ?Fascia: No ?Tendon: No ?Muscle: No ?Joint: No ?Bone: No ?Epithelialization: Medium (34-66%) N/A N/A ?Treatment Notes ?Ele

## 2021-07-24 NOTE — Progress Notes (Addendum)
ANGELGABRIEL, WILLMORE. ([id number]) ?Visit Report for 07/24/2021 ?Arrival Information Details ?Patient Name: Charles [name], Charles [name] ?Date of Service: 07/24/2021 8:45 AM ?Medical Record Number: [id number] ?Patient Account Number: [id number] ?Date of Birth/Sex: 08-22-1960 (61 y.o. M) ?Treating RN: Carlene Coria ?Primary Care Hughie Melroy: Drema Dallas Other Clinician: ?Referring Cherice Glennie: Caroline More ?Treating Mackensi Mahadeo/Extender: Jeri Cos ?Weeks in Treatment: 1 ?Visit Information History Since Last Visit ?All ordered tests and consults were completed: No ?Patient Arrived: Ambulatory ?Added or deleted any medications: No ?Arrival Time: 09:29 ?Any new allergies or adverse reactions: No ?Accompanied By: self ?Had a fall or experienced change in No ?Transfer Assistance: None ?activities of daily living that may affect ?Patient Identification Verified: Yes ?risk of falls: ?Secondary Verification Process Completed: Yes ?Signs or symptoms of abuse/neglect since last visito No ?Patient Requires Transmission-Based Precautions: No ?Hospitalized since last visit: No ?Patient Has Alerts: Yes ?Implantable device outside of the clinic excluding No ?Patient Alerts: DIABETIC ?cellular tissue based products placed in the center ?since last visit: ?Has Dressing in Place as Prescribed: No ?Pain Present Now: No ?Electronic Signature(s) ?Signed: 07/24/2021 9:29:34 AM By: Carlene Coria RN ?Entered ByCarlene Coria on 07/24/2021 09:29:34 ?Charles [name], [name] ([id number]) ?-------------------------------------------------------------------------------- ?Clinic Level of Care Assessment Details ?Patient Name: Charles [name], Charles [name] ?Date of Service: 07/24/2021 8:45 AM ?Medical Record Number: [id number] ?Patient Account Number: [id number] ?Date of Birth/Sex: 1960-12-28 (61 y.o. M) ?Treating RN: Carlene Coria ?Primary Care Amirra Herling: Drema Dallas Other Clinician: ?Referring Kinnie Kaupp: Caroline More ?Treating Azya Barbero/Extender: Jeri Cos ?Weeks in Treatment: 1 ?Clinic  Level of Care Assessment Items ?TOOL 4 Quantity Score ?X - Use when only an EandM is performed on FOLLOW-UP visit 1 0 ?ASSESSMENTS - Nursing Assessment / Reassessment ?X - Reassessment of Co-morbidities (includes updates in patient status) 1 10 ?X- 1 5 ?Reassessment of Adherence to Treatment Plan ?ASSESSMENTS - Wound and Skin Assessment / Reassessment ?'[]'$  - Simple Wound Assessment / Reassessment - one wound 0 ?X- 2 5 ?Complex Wound Assessment / Reassessment - multiple wounds ?'[]'$  - 0 ?Dermatologic / Skin Assessment (not related to wound area) ?ASSESSMENTS - Focused Assessment ?'[]'$  - Circumferential Edema Measurements - multi extremities 0 ?'[]'$  - 0 ?Nutritional Assessment / Counseling / Intervention ?'[]'$  - 0 ?Lower Extremity Assessment (monofilament, tuning fork, pulses) ?'[]'$  - 0 ?Peripheral Arterial Disease Assessment (using hand held doppler) ?ASSESSMENTS - Ostomy and/or Continence Assessment and Care ?'[]'$  - Incontinence Assessment and Management 0 ?'[]'$  - 0 ?Ostomy Care Assessment and Management (repouching, etc.) ?PROCESS - Coordination of Care ?X - Simple Patient / Family Education for ongoing care 1 15 ?'[]'$  - 0 ?Complex (extensive) Patient / Family Education for ongoing care ?X- 1 10 ?Staff obtains Consents, Records, Test Results / Process Orders ?'[]'$  - 0 ?Staff telephones HHA, Nursing Homes / Clarify orders / etc ?'[]'$  - 0 ?Routine Transfer to another Facility (non-emergent condition) ?'[]'$  - 0 ?Routine Hospital Admission (non-emergent condition) ?'[]'$  - 0 ?New Admissions / Biomedical engineer / Ordering NPWT, Apligraf, etc. ?'[]'$  - 0 ?Emergency Hospital Admission (emergent condition) ?X- 1 10 ?Simple Discharge Coordination ?'[]'$  - 0 ?Complex (extensive) Discharge Coordination ?PROCESS - Special Needs ?'[]'$  - Pediatric / Minor Patient Management 0 ?'[]'$  - 0 ?Isolation Patient Management ?'[]'$  - 0 ?Hearing / Language / Visual special needs ?'[]'$  - 0 ?Assessment of Community assistance (transportation, D/C planning, etc.) ?'[]'$  -  0 ?Additional assistance / Altered mentation ?'[]'$  - 0 ?Support Surface(s) Assessment (bed, cushion, seat, etc.) ?INTERVENTIONS - Wound Cleansing / Measurement ?Charles [name], MCCARRICK. ([id number]) ?'[]'$  - 0 ?Simple Wound  Cleansing - one wound ?X- 2 5 ?Complex Wound Cleansing - multiple wounds ?X- 1 5 ?Wound Imaging (photographs - any number of wounds) ?'[]'$  - 0 ?Wound Tracing (instead of photographs) ?'[]'$  - 0 ?Simple Wound Measurement - one wound ?X- 2 5 ?Complex Wound Measurement - multiple wounds ?INTERVENTIONS - Wound Dressings ?X - Small Wound Dressing one or multiple wounds 2 10 ?'[]'$  - 0 ?Medium Wound Dressing one or multiple wounds ?'[]'$  - 0 ?Large Wound Dressing one or multiple wounds ?'[]'$  - 0 ?Application of Medications - topical ?'[]'$  - 0 ?Application of Medications - injection ?INTERVENTIONS - Miscellaneous ?'[]'$  - External ear exam 0 ?'[]'$  - 0 ?Specimen Collection (cultures, biopsies, blood, body fluids, etc.) ?'[]'$  - 0 ?Specimen(s) / Culture(s) sent or taken to Lab for analysis ?'[]'$  - 0 ?Patient Transfer (multiple staff / Civil Service fast streamer / Similar devices) ?'[]'$  - 0 ?Simple Staple / Suture removal (25 or less) ?'[]'$  - 0 ?Complex Staple / Suture removal (26 or more) ?'[]'$  - 0 ?Hypo / Hyperglycemic Management (close monitor of Blood Glucose) ?'[]'$  - 0 ?Ankle / Brachial Index (ABI) - do not check if billed separately ?X- 1 5 ?Vital Signs ?Has the patient been seen at the hospital within the last three years: Yes ?Total Score: 110 ?Level Of Care: New/Established - Level ?3 ?Electronic Signature(s) ?Signed: 08/05/2021 9:16:22 AM By: Carlene Coria RN ?Entered By: Carlene Coria on 07/29/2021 16:50:02 ?Charles [name], [name] ([id number]) ?-------------------------------------------------------------------------------- ?Encounter Discharge Information Details ?Patient Name: Charles [name], Charles [name] ?Date of Service: 07/24/2021 8:45 AM ?Medical Record Number: [id number] ?Patient Account Number: [id number] ?Date of Birth/Sex: 06-13-1960 (61 y.o. M) ?Treating RN: Carlene Coria ?Primary Care Bryony Kaman: Drema Dallas Other Clinician: ?Referring Kinneth Fujiwara: Caroline More ?Treating Reign Bartnick/Extender: Jeri Cos ?Weeks in Treatment: 1 ?Encounter Discharge Information Items Post Procedure Vitals ?Discharge Condition: Stable ?Temperature (?F): 97.8 ?Ambulatory Status: Ambulatory ?Pulse (bpm): 67 ?Discharge Destination: Home ?Respiratory Rate (breaths/min): 18 ?Transportation: Private Auto ?Blood Pressure (mmHg): 126/77 ?Accompanied By: self ?Schedule Follow-up Appointment: Yes ?Clinical Summary of Care: Patient Declined ?Electronic Signature(s) ?Signed: 07/29/2021 4:53:52 PM By: Carlene Coria RN ?Entered By: Carlene Coria on 07/29/2021 16:53:52 ?Charles [name], [name] ([id number]) ?-------------------------------------------------------------------------------- ?Lower Extremity Assessment Details ?Patient Name: Charles [name], Charles [name] ?Date of Service: 07/24/2021 8:45 AM ?Medical Record Number: [id number] ?Patient Account Number: [id number] ?Date of Birth/Sex: 1960-10-19 (61 y.o. M) ?Treating RN: Carlene Coria ?Primary Care Cauy Melody: Drema Dallas Other Clinician: ?Referring Meilani Edmundson: Caroline More ?Treating Karmen Altamirano/Extender: Jeri Cos ?Weeks in Treatment: 1 ?Edema Assessment ?Assessed: [Left: No] [Right: No] ?[Left: Edema] [Right: :] ?Calf ?Left: Right: ?Point of Measurement: 38 cm From Medial Instep 42 cm 38 cm ?Ankle ?Left: Right: ?Point of Measurement: 10 cm From Medial Instep 31 cm 23 cm ?Knee To Floor ?Left: Right: ?From Medial Instep 50 cm 50 cm ?Vascular Assessment ?Pulses: ?Dorsalis Pedis ?Palpable: [Left:Yes] [Right:Yes] ?Electronic Signature(s) ?Signed: 07/29/2021 4:44:10 PM By: Carlene Coria RN ?Entered By: Carlene Coria on 07/29/2021 16:44:10 ?Charles [name], [name] ([id number]) ?-------------------------------------------------------------------------------- ?Multi Wound Chart Details ?Patient Name: Charles [name], Charles [name] ?Date of Service: 07/24/2021 8:45 AM ?Medical Record Number:  [id number] ?Patient Account Number: [id number] ?Date of Birth/Sex: 1961/02/22 (61 y.o. M) ?Treating RN: Carlene Coria ?Primary Care Ameenah Prosser: Drema Dallas Other Clinician: ?Referring Lasondra Hodgkins: Caroline More ?Treating Teran Knittle/E

## 2021-07-28 ENCOUNTER — Encounter: Payer: Self-pay | Admitting: Physician Assistant

## 2021-07-28 ENCOUNTER — Ambulatory Visit (INDEPENDENT_AMBULATORY_CARE_PROVIDER_SITE_OTHER): Payer: Managed Care, Other (non HMO) | Admitting: Physician Assistant

## 2021-07-28 DIAGNOSIS — L97519 Non-pressure chronic ulcer of other part of right foot with unspecified severity: Secondary | ICD-10-CM

## 2021-07-28 DIAGNOSIS — I10 Essential (primary) hypertension: Secondary | ICD-10-CM

## 2021-07-28 DIAGNOSIS — G4719 Other hypersomnia: Secondary | ICD-10-CM

## 2021-07-28 DIAGNOSIS — L97529 Non-pressure chronic ulcer of other part of left foot with unspecified severity: Secondary | ICD-10-CM

## 2021-07-28 DIAGNOSIS — S98112A Complete traumatic amputation of left great toe, initial encounter: Secondary | ICD-10-CM

## 2021-07-28 DIAGNOSIS — F411 Generalized anxiety disorder: Secondary | ICD-10-CM

## 2021-07-28 DIAGNOSIS — R300 Dysuria: Secondary | ICD-10-CM

## 2021-07-28 DIAGNOSIS — Z0001 Encounter for general adult medical examination with abnormal findings: Secondary | ICD-10-CM

## 2021-07-28 DIAGNOSIS — E039 Hypothyroidism, unspecified: Secondary | ICD-10-CM

## 2021-07-28 DIAGNOSIS — G894 Chronic pain syndrome: Secondary | ICD-10-CM

## 2021-07-28 DIAGNOSIS — E782 Mixed hyperlipidemia: Secondary | ICD-10-CM

## 2021-07-28 MED ORDER — SILDENAFIL CITRATE 20 MG PO TABS: 20.0000 mg | ORAL_TABLET | Freq: Every day | ORAL | 3 refills | Status: DC | PRN

## 2021-07-28 NOTE — Progress Notes (Addendum)
SHAQUON, GROPP. ([id number]) ?Visit Report for 07/24/2021 ?Chief Complaint Document Details ?Patient Name: Charles [name], Charles [name] ?Date of Service: 07/24/2021 8:00 AM ?Medical Record Number: [id number] ?Patient Account Number: [id number] ?Date of Birth/Sex: [birth year] (61 y.o. Male) ?Treating RN: Carlene Coria ?Primary Care Provider: Drema Dallas Other Clinician: ?Referring Provider: Caroline More ?Treating Provider/Extender: Jeri Cos ?Weeks in Treatment: 1 ?Information Obtained from: Patient ?Chief Complaint ?Left foot open wound at the great toe amputation site - Workers Comp Injury ?Electronic Signature(s) ?Signed: 07/29/2021 4:49:47 PM By: Worthy Keeler PA-C ?Entered By: Worthy Keeler on 07/29/2021 16:49:46 ?JUREWICZ, Adam S. ([id number]) ?-------------------------------------------------------------------------------- ?Debridement Details ?Patient Name: Charles [name] ?Date of Service: 07/24/2021 8:00 AM ?Medical Record Number: [id number] ?Patient Account Number: [id number] ?Date of Birth/Sex: 19-Feb-1961 (61 y.o. Male) ?Treating RN: Carlene Coria ?Primary Care Provider: Drema Dallas Other Clinician: ?Referring Provider: Caroline More ?Treating Provider/Extender: Jeri Cos ?Weeks in Treatment: 1 ?Debridement Performed for ?Wound #1 Left Amputation Site - Toe ?Assessment: ?Performed By: Physician Tommie Sams., PA-C ?Debridement Type: Debridement ?Level of Consciousness (Pre- ?Awake and Alert ?procedure): ?Pre-procedure Verification/Time Out ?Yes - 08:30 ?Taken: ?Start Time: 08:30 ?Total Area Debrided (L x W): 0.7 (cm) x 3 (cm) = 2.1 (cm?) ?Tissue and other material ?Viable, Non-Viable, Callus, Slough, Subcutaneous, Erin ?debrided: ?Level: Skin/Subcutaneous Tissue ?Debridement Description: Excisional ?Instrument: Curette ?Bleeding: Minimum ?Hemostasis Achieved: Pressure ?End Time: 08:36 ?Procedural Pain: 0 ?Post Procedural Pain: 0 ?Response to Treatment: Procedure was tolerated well ?Level of Consciousness  (Post- ?Awake and Alert ?procedure): ?Post Debridement Measurements of Total Wound ?Length: (cm) 0.7 ?Width: (cm) 3 ?Depth: (cm) 0.3 ?Volume: (cm?) 0.495 ?Character of Wound/Ulcer Post Debridement: Improved ?Post Procedure Diagnosis ?Same as Pre-procedure ?Electronic Signature(s) ?Signed: 07/24/2021 9:23:23 AM By: Carlene Coria RN ?Signed: 07/28/2021 4:48:03 PM By: Worthy Keeler PA-C ?Entered By: Carlene Coria on 07/24/2021 08:34:32 ?Gonzalo, Waymire Tavarius S. ([id number]) ?-------------------------------------------------------------------------------- ?HPI Details ?Patient Name: Charles [name] ?Date of Service: 07/24/2021 8:00 AM ?Medical Record Number: [id number] ?Patient Account Number: [id number] ?Date of Birth/Sex: 05/27/1960 (61 y.o. Male) ?Treating RN: Carlene Coria ?Primary Care Provider: Drema Dallas Other Clinician: ?Referring Provider: Caroline More ?Treating Provider/Extender: Jeri Cos ?Weeks in Treatment: 1 ?History of Present Illness ?HPI Description: 5.7 diet controlled ?Workers Compensation Chart: ?07/15/2021 upon evaluation today patient appears to be doing decently well in regard to a wound on the left foot first great toe amputation site. ?This is a wound that has been taken care of by Dr. Luana Shu up to this point at podiatry. He also has been seeing the patient for wounds that are ?non-Worker's Comp. related that will be dictated in a another note to this is actually the [employer] note currently. The patient sustained this ?injury when he was at work and apparently there was a machine that you stood beside that would come down to slice something that apparently ?his great toe got caught in the machine and removed a portion of this and ended up requiring a amputation of the digit completely. Subsequently ?this had for the most part healed but there was a small dehiscence and its not closed completely. Again this is the [employer] portion of his ?issues going on at this point. The good news is  there does not appear to be any signs of infection right now which is great news. The patient does ?have several medical problems noted below. ?The patient does have diabetes mellitus type 2, hypertension, and coronary artery disease. Of note however his hemoglobin A1c is actually very ?good at 5.7  and this is diet controlled. Overall this should both well for healing with appropriate offloading which I think is going to be probably ?the primary focus of what we will be doing. The good news is he is also had x-rays which did not show any evidence of osteomyelitis he has not ?had an MRI since surgery. ?With regard to the non-Worker's Comp. wounds he has 1 on the fifth metatarsal lateral region of the left foot as well as the plantar aspect of the ?right foot. Again these are not Worker's Comp. related and are being treated separately. Nonetheless we were not able to document a separate ?note between the [employer] and the regular at this point and therefore were combining everything as I wanted to be documented and not ?left out. ?07-24-2021 upon evaluation today patient's wound actually appears to be somewhat dry. He did go to his surgeon's office and they actually had ?switch to the dressing that we had recommended and went back to the Betadine moistened gauze packing. With that being said that does not ?appear to have done too well for him in fact he is definitely showing signs of this being much too dry at this point. We will definitely get a need to ?work on this as far as trying to get the area doing better from a moisture standpoint we need this more balanced always I do want to wet but also ?do not want to dry. Fortunately he is not having any pain which is good news. Apparently his surgeon was interested in possibility of hyperbarics ?but the patient does not have a documented osteomyelitis and in fact his CT scan from earlier in this year showed no evidence of osteomyelitis ?which is good news for him to  be honest. I am seeing him under a separate encounter for the non-Worker's Comp. injuries. ?Electronic Signature(s) ?Signed: 07/29/2021 4:51:07 PM By: Worthy Keeler PA-C ?Entered By: Worthy Keeler on 07/29/2021 16:51:06 ?MANNOR, Ramaj S. ([id number]) ?-------------------------------------------------------------------------------- ?Physical Exam Details ?Patient Name: NOLEN, LINDAMOOD ?Date of Service: 07/24/2021 8:00 AM ?Medical Record Number: [id number] ?Patient Account Number: [id number] ?Date of Birth/Sex: May 14, 1960 (61 y.o. Male) ?Treating RN: Carlene Coria ?Primary Care Provider: Drema Dallas Other Clinician: ?Referring Provider: Caroline More ?Treating Provider/Extender: Jeri Cos ?Weeks in Treatment: 1 ?Constitutional ?Well-nourished and well-hydrated in no acute distress. ?Respiratory ?normal breathing without difficulty. ?Psychiatric ?this patient is able to make decisions and demonstrates good insight into disease process. Alert and Oriented x 3. pleasant and cooperative. ?Notes ?Upon inspection patient's wound bed actually showed signs of again being very dry I do believe that we need to attempt and balance this from a ?moisture standpoint. I do believe that the patient would benefit from a switching over to the silver collagen followed by the petroleum gauze over ?top in order to prevent it from drying out the Betadine moistened gauze actually is much too dry for him. With that being said I do believe that the ?patient is doing well from the standpoint of the debridement which does seem to have clean things up quite nicely and very pleased in that ?regard. I am hopeful that we will be able to get this to heal rather rapidly. ?Electronic Signature(s) ?Signed: 07/29/2021 4:52:26 PM By: Worthy Keeler PA-C ?Entered By: Worthy Keeler on 07/29/2021 16:52:25 ?BATZ, Rockford S. ([id number]) ?-------------------------------------------------------------------------------- ?Physician Orders  Details ?Patient Name: KAYDAN, WONG ?Date of Service: 07/24/2021 8:00 AM ?Medical Record Number: [id number] ?Patient Account Number: [id number] ?Date of Birth/Sex: 1961/03/07 (61  y.o. Male) ?Treating RN: Waunita Schooner
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
## 2021-07-31 ENCOUNTER — Encounter: Payer: No Typology Code available for payment source | Admitting: Physician Assistant

## 2021-07-31 DIAGNOSIS — L97512 Non-pressure chronic ulcer of other part of right foot with fat layer exposed: Secondary | ICD-10-CM | POA: Diagnosis not present

## 2021-07-31 DIAGNOSIS — Z89412 Acquired absence of left great toe: Secondary | ICD-10-CM | POA: Diagnosis not present

## 2021-07-31 DIAGNOSIS — E11621 Type 2 diabetes mellitus with foot ulcer: Secondary | ICD-10-CM | POA: Diagnosis present

## 2021-07-31 DIAGNOSIS — I2510 Atherosclerotic heart disease of native coronary artery without angina pectoris: Secondary | ICD-10-CM | POA: Diagnosis not present

## 2021-07-31 DIAGNOSIS — L97522 Non-pressure chronic ulcer of other part of left foot with fat layer exposed: Secondary | ICD-10-CM | POA: Diagnosis not present

## 2021-07-31 DIAGNOSIS — I10 Essential (primary) hypertension: Secondary | ICD-10-CM | POA: Diagnosis not present

## 2021-08-05 NOTE — Progress Notes (Signed)
CASYN, BECVAR. ([id number]) ?Visit Report for 07/31/2021 ?Chief Complaint Document Details ?Patient Name: Charles [name], Charles [name] ?Date of Service: 07/31/2021 8:00 AM ?Medical Record Number: [id number] ?Patient Account Number: [id number] ?Date of Birth/Sex: [birth year] (61 y.o. M) ?Treating RN: Carlene Coria ?Primary Care Provider: Drema Dallas Other Clinician: ?Referring Provider: Caroline More ?Treating Provider/Extender: Jeri Cos ?Weeks in Treatment: 2 ?Information Obtained from: Patient ?Chief Complaint ?Left lateral foot and right plantar foot ulcer - NON-Workers Comp Ulcers ?Electronic Signature(s) ?Signed: 07/31/2021 8:42:44 AM By: Worthy Keeler PA-C ?Entered By: Worthy Keeler on 07/31/2021 08:42:43 ?[name], [name] ([id number]) ?-------------------------------------------------------------------------------- ?Debridement Details ?Patient Name: Charles [name], Charles [name] ?Date of Service: 07/31/2021 8:00 AM ?Medical Record Number: [id number] ?Patient Account Number: [id number] ?Date of Birth/Sex: July 24, 1960 (61 y.o. M) ?Treating RN: Carlene Coria ?Primary Care Provider: Drema Dallas Other Clinician: ?Referring Provider: Caroline More ?Treating Provider/Extender: Jeri Cos ?Weeks in Treatment: 2 ?Debridement Performed for ?Wound #2 Right,Plantar Foot ?Assessment: ?Performed By: Physician Tommie Sams., PA-C ?Debridement Type: Debridement ?Severity of Tissue Pre Debridement: Fat layer exposed ?Level of Consciousness (Pre- ?Awake and Alert ?procedure): ?Pre-procedure Verification/Time Out ?Yes - 08:30 ?Taken: ?Start Time: 08:30 ?Total Area Debrided (L x W): 0.5 (cm) x 0.7 (cm) = 0.35 (cm?) ?Tissue and other material ?Viable, Non-Viable, Callus, Slough, Subcutaneous, [place] ?debrided: ?Level: Skin/Subcutaneous Tissue ?Debridement Description: Excisional ?Instrument: Curette ?Bleeding: Minimum ?Hemostasis Achieved: Pressure ?End Time: 08:38 ?Procedural Pain: 0 ?Post Procedural Pain: 0 ?Response to Treatment:  Procedure was tolerated well ?Level of Consciousness (Post- ?Awake and Alert ?procedure): ?Post Debridement Measurements of Total Wound ?Length: (cm) 0.5 ?Width: (cm) 0.7 ?Depth: (cm) 0.4 ?Volume: (cm?) 0.11 ?Character of Wound/Ulcer Post Debridement: Improved ?Severity of Tissue Post Debridement: Fat layer exposed ?Post Procedure Diagnosis ?Same as Pre-procedure ?Electronic Signature(s) ?Signed: 07/31/2021 3:59:26 PM By: Worthy Keeler PA-C ?Signed: 08/05/2021 9:16:22 AM By: Carlene Coria RN ?Entered By: Carlene Coria on 07/31/2021 08:33:20 ?[name], [name] ([id number]) ?-------------------------------------------------------------------------------- ?Debridement Details ?Patient Name: Charles [name], Charles [name] ?Date of Service: 07/31/2021 8:00 AM ?Medical Record Number: [id number] ?Patient Account Number: [id number] ?Date of Birth/Sex: Mar 23, 1961 (61 y.o. M) ?Treating RN: Carlene Coria ?Primary Care Provider: Drema Dallas Other Clinician: ?Referring Provider: Caroline More ?Treating Provider/Extender: Jeri Cos ?Weeks in Treatment: 2 ?Debridement Performed for ?Wound #3 Left Metatarsal head fifth ?Assessment: ?Performed By: Physician Tommie Sams., PA-C ?Debridement Type: Debridement ?Severity of Tissue Pre Debridement: Fat layer exposed ?Level of Consciousness (Pre- ?Awake and Alert ?procedure): ?Pre-procedure Verification/Time Out ?Yes - 08:30 ?Taken: ?Start Time: 08:30 ?Total Area Debrided (L x W): 2 (cm) x 2 (cm) = 4 (cm?) ?Tissue and other material ?Viable, Non-Viable, Callus, Slough, Subcutaneous, [place] ?debrided: ?Level: Skin/Subcutaneous Tissue ?Debridement Description: Excisional ?Instrument: Curette ?Bleeding: Minimum ?Hemostasis Achieved: Pressure ?End Time: 08:38 ?Procedural Pain: 0 ?Post Procedural Pain: 0 ?Response to Treatment: Procedure was tolerated well ?Level of Consciousness (Post- ?Awake and Alert ?procedure): ?Post Debridement Measurements of Total Wound ?Length: (cm) 1.7 ?Width: (cm) 1.8 ?Depth:  (cm) 0.4 ?Volume: (cm?) 0.961 ?Character of Wound/Ulcer Post Debridement: Improved ?Severity of Tissue Post Debridement: Fat layer exposed ?Post Procedure Diagnosis ?Same as Pre-procedure ?Electronic Signature(s) ?Signed: 07/31/2021 3:59:26 PM By: Worthy Keeler PA-C ?Signed: 08/05/2021 9:16:22 AM By: Carlene Coria RN ?Entered By: Carlene Coria on 07/31/2021 08:34:01 ?Charles [name], Charles Wess S. ([id number]) ?-------------------------------------------------------------------------------- ?HPI Details ?Patient Name: Charles [name], Charles [name] ?Date of Service: 07/31/2021 8:00 AM ?Medical Record Number: [id number] ?Patient Account Number: [id number] ?Date of Birth/Sex: 09-18-1960 (61 y.o. M) ?Treating RN: Carlene Coria ?Primary Care Provider: Drema Dallas Other Clinician: ?  Referring Provider: Caroline More ?Treating Provider/Extender: Jeri Cos ?Weeks in Treatment: 2 ?History of Present Illness ?HPI Description: 5.7 diet controlled ?Workers Compensation Chart: ?07/15/2021 upon evaluation today patient appears to be doing decently well in regard to a wound on the left foot first great toe amputation site. ?This is a wound that has been taken care of by Dr. Luana Shu up to this point at podiatry. He also has been seeing the patient for wounds that are ?non-Worker's Comp. related that will be dictated in a another note to this is actually the [employer] note currently. The patient sustained this ?injury when he was at work and apparently there was a machine that you stood beside that would come down to slice something that apparently ?his great toe got caught in the machine and removed a portion of this and ended up requiring a amputation of the digit completely. Subsequently ?this had for the most part healed but there was a small dehiscence and its not closed completely. Again this is the [employer] portion of his ?issues going on at this point. The good news is there does not appear to be any signs of infection right now which is  great news. The patient does ?have several medical problems noted below. ?The patient does have diabetes mellitus type 2, hypertension, and coronary artery disease. Of note however his hemoglobin A1c is actually very ?good at 5.7 and this is diet controlled. Overall this should both well for healing with appropriate offloading which I think is going to be probably ?the primary focus of what we will be doing. The good news is he is also had x-rays which did not show any evidence of osteomyelitis he has not ?had an MRI since surgery. ?With regard to the non-Worker's Comp. wounds he has 1 on the fifth metatarsal lateral region of the left foot as well as the plantar aspect of the ?right foot. Again these are not Worker's Comp. related and are being treated separately. Nonetheless we were not able to document a separate ?note between the [employer] and the regular at this point and therefore were combining everything as I wanted to be documented and not ?left out. ?07-24-2021 upon evaluation today patient appears to be doing a little better in regard to the wounds on the lateral aspect of his left foot as well as ?the right plantar foot. He is showing again some signs of increased granulation I am pleased in that regard. Fortunately I do not see any signs of ?active infection locally or systemically which is great news. No fevers, chills, nausea, vomiting, or diarrhea. I do think that potentially we may ?need to look into repeat evaluation possibly MRI of the left foot if things do not show signs of improvement previously in the area had a CT scan ?which showed no osteomyelitis but I believe that was January 2023. That could have changed in the interim and we need to keep a close eye on ?things here. ?07-31-2021 upon evaluation today patient appears to be doing well with regard to his wounds. He does have bone exposed on the left lateral foot ?although I could not identify any necrotic bone in particular. With that  being said there is a lot of callus around the edges of the plantar foot right ?and I am going to perform some debridement on both locations. ?Electronic Signature(s) ?Signed: 07/31/2021 9:18:17 AM By: Ilsa Iha

## 2021-08-05 NOTE — Progress Notes (Signed)
Charles, [name]. ([id number]) ?Visit Report for 07/24/2021 ?Chief Complaint Document Details ?Patient Name: Charles [name], Charles [name] ?Date of Service: 07/24/2021 8:45 AM ?Medical Record Number: [id number] ?Patient Account Number: [id number] ?Date of Birth/Sex: 02-18-1961 (61 y.o. M) ?Treating RN: Charles [name] ?Primary Care Provider: Drema [name] Other Clinician: ?Referring Provider: Caroline [name] ?Treating Provider/Extender: Charles [name] ?Weeks in Treatment: 1 ?Information Obtained from: Patient ?Chief Complaint ?Left lateral foot and right plantar foot ulcer - NON-Workers Comp Ulcers ?Electronic Signature(s) ?Signed: 07/29/2021 5:12:27 PM By: Charles Keeler PA-C ?Entered By: Charles [name] on 07/29/2021 17:12:27 ?[name], [name] ([id number]) ?-------------------------------------------------------------------------------- ?Debridement Details ?Patient Name: Charles [name], Charles [name] ?Date of Service: 07/24/2021 8:45 AM ?Medical Record Number: [id number] ?Patient Account Number: [id number] ?Date of Birth/Sex: 22-Sep-1960 (61 y.o. M) ?Treating RN: Charles [name] ?Primary Care Provider: Drema [name] Other Clinician: ?Referring Provider: Caroline [name] ?Treating Provider/Extender: Charles [name] ?Weeks in Treatment: 1 ?Debridement Performed for ?Wound #2 Right,Plantar Foot ?Assessment: ?Performed By: Physician Charles [name]., PA-C ?Debridement Type: Chemical/Enzymatic/Mechanical ?Agent Used: saline and gauze ?Severity of Tissue Pre Debridement: Fat layer exposed ?Level of Consciousness (Pre- ?Awake and Alert ?procedure): ?Pre-procedure Verification/Time Out ?Yes - 09:00 ?Taken: ?Start Time: 09:00 ?Instrument: ?Other : saline and gauze ?Bleeding: Minimum ?Hemostasis Achieved: Pressure ?End Time: 09:10 ?Procedural Pain: 0 ?Post Procedural Pain: 0 ?Response to Treatment: Procedure was tolerated well ?Level of Consciousness (Post- ?Awake and Alert ?procedure): ?Post Debridement Measurements of Total Wound ?Length: (cm) 1.7 ?Width: (cm)  2 ?Depth: (cm) 0.4 ?Volume: (cm?) 1.068 ?Character of Wound/Ulcer Post Debridement: Improved ?Severity of Tissue Post Debridement: Fat layer exposed ?Post Procedure Diagnosis ?Same as Pre-procedure ?Electronic Signature(s) ?Signed: 07/29/2021 4:48:41 PM By: Charles Coria RN ?Signed: 07/29/2021 5:16:21 PM By: Charles Keeler PA-C ?Entered By: Charles [name] on 07/29/2021 16:48:41 ?[name], [name] ([id number]) ?-------------------------------------------------------------------------------- ?Debridement Details ?Patient Name: Charles [name], Charles [name] ?Date of Service: 07/24/2021 8:45 AM ?Medical Record Number: [id number] ?Patient Account Number: [id number] ?Date of Birth/Sex: [birth year] (61 y.o. M) ?Treating RN: Charles [name] ?Primary Care Provider: Drema [name] Other Clinician: ?Referring Provider: Caroline [name] ?Treating Provider/Extender: Charles [name] ?Weeks in Treatment: 1 ?Debridement Performed for ?Wound #3 Left Metatarsal head fifth ?Assessment: ?Performed By: Physician Charles [name]., PA-C ?Debridement Type: Chemical/Enzymatic/Mechanical ?Agent Used: saline and gauze ?Severity of Tissue Pre Debridement: Fat layer exposed ?Level of Consciousness (Pre- ?Awake and Alert ?procedure): ?Pre-procedure Verification/Time Out ?Yes - 09:00 ?Taken: ?Start Time: 09:00 ?Instrument: ?Other : saline and gauze ?Bleeding: Minimum ?Hemostasis Achieved: Pressure ?End Time: 09:10 ?Procedural Pain: 0 ?Post Procedural Pain: 0 ?Response to Treatment: Procedure was tolerated well ?Level of Consciousness (Post- ?Awake and Alert ?procedure): ?Post Debridement Measurements of Total Wound ?Length: (cm) 0.5 ?Width: (cm) 0.8 ?Depth: (cm) 0.3 ?Volume: (cm?) 0.094 ?Character of Wound/Ulcer Post Debridement: Improved ?Severity of Tissue Post Debridement: Fat layer exposed ?Post Procedure Diagnosis ?Same as Pre-procedure ?Electronic Signature(s) ?Signed: 07/29/2021 4:49:08 PM By: Charles Coria RN ?Signed: 07/29/2021 5:16:21 PM By: Charles Keeler  PA-C ?Entered By: Charles [name] on 07/29/2021 16:49:08 ?[name], [name] ([id number]) ?-------------------------------------------------------------------------------- ?HPI Details ?Patient Name: Charles [name], Charles [name] ?Date of Service: 07/24/2021 8:45 AM ?Medical Record Number: [id number] ?Patient Account Number: [id number] ?Date of Birth/Sex: 31-Jan-1961 (61 y.o. M) ?Treating RN: Charles [name] ?Primary Care Provider: Drema [name] Other Clinician: ?Referring Provider: Caroline [name] ?Treating Provider/Extender: Charles [name] ?Weeks in Treatment: 1 ?History of Present Illness ?HPI Description: 5.7 diet controlled ?Workers Compensation Chart: ?07/15/2021 upon evaluation today patient appears to be doing decently well in regard to a wound on the left foot  first great toe amputation site. ?This is a wound that has been taken care of by Dr. Luana [name] up to this point at podiatry. He also has been seeing the patient for wounds that are ?non-Worker's Comp. related that will be dictated in a another note to this is actually the [employer] note currently. The patient sustained this ?injury when he was at work and apparently there was a machine that you stood beside that would come down to slice something that apparently ?his great toe got caught in the machine and removed a portion of this and ended up requiring a amputation of the digit completely. Subsequently ?this had for the most part healed but there was a small dehiscence and its not closed completely. Again this is the [employer] portion of his ?issues going on at this point. The good news is there does not appear to be any signs of infection right now which is great news. The patient does ?have several medical problems noted below. ?The patient does have diabetes mellitus type 2, hypertension, and coronary artery disease. Of note however his hemoglobin A1c is actually very ?good at 5.7 and this is diet controlled. Overall this should both well for healing with appropriate  offloading which I think is going to be probably ?the primary focus of what we will be doing. The good news is he is also had x-rays which did not show any evidence of osteomyelitis he has not ?had an MRI since surgery. ?With regard to the non-Worker's Comp. wounds he has 1 on the fifth metatarsal lateral region of the left foot as well as the plantar aspect of the ?right foot. Again these are not Worker's Comp. related and are being treated separately. Nonetheless we were not able to document a separate ?note between the [employer] and the regular at this point and therefore were combining everything as I wanted to be documented and not ?left out. ?07-24-2021 upon evaluation today patient appears to be doing a little better in regard to the wounds on the lateral aspect of his left foot as well as ?the right plantar foot. He is showing again some signs of increased granulation I am pleased in that regard. Fortunately I do not see any signs of ?active infection locally or systemically which is great news. No fevers, chills, nausea, vomiting, or diarrhea. I do think that potentially we may ?need to look into repeat evaluation possibly MRI of the left foot if things do not show signs of improvement previously in the area had a CT scan ?which showed no osteomyelitis but I believe that was January 2023. That could have changed in the interim and we need to keep a close eye on ?things here. ?Electronic Signature(s) ?Signed: 07/29/2021 5:13:31 PM By: Charles Keeler PA-C ?Entered By: Charles [name] on 07/29/2021 17:13:31 ?[name], [name] ([id number]) ?-------------------------------------------------------------------------------- ?Physical Exam Details ?Patient Name: Charles [name], Charles [name] ?Date of Service: 07/24/2021 8:45 AM ?Medical Record Number: [id number] ?Patient Account Number: [id number] ?Date of Birth/Sex: 1960-09-08 (61 y.o. M) ?Treating RN: Charles [name] ?Primary Care Provider: Drema [name] Other  Clinician: ?Referring Provider: Caroline [name] ?Treating Provider/Extender: Charles [name] ?Weeks in Treatment: 1 ?Constitutional ?Well-nourished and well-hydrated in no acute distress. ?Respiratory ?normal breathing without difficulty. ?

## 2021-08-05 NOTE — Progress Notes (Signed)
Charles [name], Charles [name]. ([id number]) ?Visit Report for 07/31/2021 ?Arrival Information Details ?Patient Name: Charles [name], Charles [name] ?Date of Service: 07/31/2021 8:00 AM ?Medical Record Number: [id number] ?Patient Account Number: [id number] ?Date of Birth/Sex: May 10, 1960 (61 y.o. M) ?Treating RN: Carlene Coria ?Primary Care Tayra Dawe: Drema Dallas Other Clinician: ?Referring Tasia Liz: Caroline More ?Treating Kazaria Gaertner/Extender: Jeri Cos ?Weeks in Treatment: 2 ?Visit Information History Since Last Visit ?All ordered tests and consults were completed: No ?Patient Arrived: Ambulatory ?Added or deleted any medications: No ?Arrival Time: 08:08 ?Any new allergies or adverse reactions: No ?Accompanied By: self ?Had a fall or experienced change in No ?Transfer Assistance: None ?activities of daily living that may affect ?Patient Identification Verified: Yes ?risk of falls: ?Secondary Verification Process Completed: Yes ?Signs or symptoms of abuse/neglect since last visito No ?Patient Requires Transmission-Based Precautions: No ?Hospitalized since last visit: No ?Patient Has Alerts: Yes ?Implantable device outside of the clinic excluding No ?Patient Alerts: DIABETIC ?cellular tissue based products placed in the center ?since last visit: ?Has Dressing in Place as Prescribed: Yes ?Pain Present Now: No ?Notes ?wound number 1 workmans comp ?Electronic Signature(s) ?Signed: 08/05/2021 9:16:22 AM By: Carlene Coria RN ?Entered By: Carlene Coria on 07/31/2021 78:29:56 ?[name], [name] ([id number]) ?-------------------------------------------------------------------------------- ?Clinic Level of Care Assessment Details ?Patient Name: Charles [name], Charles [name] ?Date of Service: 07/31/2021 8:00 AM ?Medical Record Number: [id number] ?Patient Account Number: [id number] ?Date of Birth/Sex: February 09, 1961 (61 y.o. M) ?Treating RN: Carlene Coria ?Primary Care Haroon Shatto: Drema Dallas Other Clinician: ?Referring Valetta Mulroy: Caroline More ?Treating Ameen Mostafa/Extender:  Jeri Cos ?Weeks in Treatment: 2 ?Clinic Level of Care Assessment Items ?TOOL 1 Quantity Score ?'[]'$  - Use when EandM and Procedure is performed on INITIAL visit 0 ?ASSESSMENTS - Nursing Assessment / Reassessment ?'[]'$  - General Physical Exam (combine w/ comprehensive assessment (listed just below) when performed on new ?0 ?pt. evals) ?'[]'$  - 0 ?Comprehensive Assessment (HX, ROS, Risk Assessments, Wounds Hx, etc.) ?ASSESSMENTS - Wound and Skin Assessment / Reassessment ?'[]'$  - Dermatologic / Skin Assessment (not related to wound area) 0 ?ASSESSMENTS - Ostomy and/or Continence Assessment and Care ?'[]'$  - Incontinence Assessment and Management 0 ?'[]'$  - 0 ?Ostomy Care Assessment and Management (repouching, etc.) ?PROCESS - Coordination of Care ?'[]'$  - Simple Patient / Family Education for ongoing care 0 ?'[]'$  - 0 ?Complex (extensive) Patient / Family Education for ongoing care ?'[]'$  - 0 ?Staff obtains Consents, Records, Test Results / Process Orders ?'[]'$  - 0 ?Staff telephones HHA, Nursing Homes / Clarify orders / etc ?'[]'$  - 0 ?Routine Transfer to another Facility (non-emergent condition) ?'[]'$  - 0 ?Routine Hospital Admission (non-emergent condition) ?'[]'$  - 0 ?New Admissions / Biomedical engineer / Ordering NPWT, Apligraf, etc. ?'[]'$  - 0 ?Emergency Hospital Admission (emergent condition) ?PROCESS - Special Needs ?'[]'$  - Pediatric / Minor Patient Management 0 ?'[]'$  - 0 ?Isolation Patient Management ?'[]'$  - 0 ?Hearing / Language / Visual special needs ?'[]'$  - 0 ?Assessment of Community assistance (transportation, D/C planning, etc.) ?'[]'$  - 0 ?Additional assistance / Altered mentation ?'[]'$  - 0 ?Support Surface(s) Assessment (bed, cushion, seat, etc.) ?INTERVENTIONS - Miscellaneous ?'[]'$  - External ear exam 0 ?'[]'$  - 0 ?Patient Transfer (multiple staff / Civil Service fast streamer / Similar devices) ?'[]'$  - 0 ?Simple Staple / Suture removal (25 or less) ?'[]'$  - 0 ?Complex Staple / Suture removal (26 or more) ?'[]'$  - 0 ?Hypo/Hyperglycemic Management (do not check if billed  separately) ?'[]'$  - 0 ?Ankle / Brachial Index (ABI) - do not check if billed separately ?Has the patient been seen at the hospital within  the last three years: Yes ?Total Score: 0 ?Level Of Care: ____ Charles [name] ([id number]) ?Electronic Signature(s) ?Signed: 08/05/2021 9:16:22 AM By: Carlene Coria RN ?Entered By: Carlene Coria on 07/31/2021 08:35:37 ?[name], [name] ([id number]) ?-------------------------------------------------------------------------------- ?Encounter Discharge Information Details ?Patient Name: Charles [name], Charles [name] ?Date of Service: 07/31/2021 8:00 AM ?Medical Record Number: [id number] ?Patient Account Number: [id number] ?Date of Birth/Sex: 1960-10-29 (61 y.o. M) ?Treating RN: Carlene Coria ?Primary Care Gordana Kewley: Drema Dallas Other Clinician: ?Referring Nyleah Mcginnis: Caroline More ?Treating Ashby Leflore/Extender: Jeri Cos ?Weeks in Treatment: 2 ?Encounter Discharge Information Items Post Procedure Vitals ?Discharge Condition: Stable ?Temperature (?F): 98.2 ?Ambulatory Status: Ambulatory ?Pulse (bpm): 72 ?Discharge Destination: Home ?Respiratory Rate (breaths/min): 18 ?Transportation: Private Auto ?Blood Pressure (mmHg): 169/100 ?Accompanied By: self ?Schedule Follow-up Appointment: Yes ?Clinical Summary of Care: Patient Declined ?Electronic Signature(s) ?Signed: 08/05/2021 9:16:22 AM By: Carlene Coria RN ?Entered By: Carlene Coria on 07/31/2021 08:50:24 ?[name], [name] ([id number]) ?-------------------------------------------------------------------------------- ?Lower Extremity Assessment Details ?Patient Name: Charles [name], Charles [name] ?Date of Service: 07/31/2021 8:00 AM ?Medical Record Number: [id number] ?Patient Account Number: [id number] ?Date of Birth/Sex: Jan 19, 1961 (61 y.o. M) ?Treating RN: Carlene Coria ?Primary Care Rithik Odea: Drema Dallas Other Clinician: ?Referring Wrigley Plasencia: Caroline More ?Treating Gustie Bobb/Extender: Jeri Cos ?Weeks in Treatment: 2 ?Edema Assessment ?Assessed: [Left: No]  [Right: No] ?Edema: [Left: Yes] [Right: Yes] ?Calf ?Left: Right: ?Point of Measurement: 38 cm From Medial Instep 42 cm 38 cm ?Ankle ?Left: Right: ?Point of Measurement: 10 cm From Medial Instep 31 cm 23 cm ?Knee To Floor ?Left: Right: ?From Medial Instep 50 cm 50 cm ?Vascular Assessment ?Pulses: ?Dorsalis Pedis ?Palpable: [Left:Yes] [Right:Yes] ?Electronic Signature(s) ?Signed: 08/05/2021 9:16:22 AM By: Carlene Coria RN ?Entered By: Carlene Coria on 07/31/2021 08:23:37 ?[name], [name] ([id number]) ?-------------------------------------------------------------------------------- ?Multi Wound Chart Details ?Patient Name: Charles [name], Charles [name] ?Date of Service: 07/31/2021 8:00 AM ?Medical Record Number: [id number] ?Patient Account Number: [id number] ?Date of Birth/Sex: [birth year] (61 y.o. M) ?Treating RN: Carlene Coria ?Primary Care Gordana Kewley: Drema Dallas Other Clinician: ?Referring Latonya Nelon: Caroline More ?Treating Cosimo Schertzer/Extender: Jeri Cos ?Weeks in Treatment: 2 ?Vital Signs ?Height(in): 73 ?Pulse(bpm): 72 ?Weight(lbs): 230 ?Blood Pressure(mmHg): 169/100 ?Body Mass Index(BMI): 30.3 ?Temperature(??F): 98.2 ?Respiratory Rate(breaths/min): 18 ?Photos: [N/A:N/A] ?Wound Location: Right, Plantar Foot Left Metatarsal head fifth N/A ?Wounding Event: Gradually Appeared Gradually Appeared N/A ?Primary Etiology: Diabetic Wound/Ulcer of the Lower Diabetic Wound/Ulcer of the Lower N/A ?Extremity Extremity ?Comorbid History: Coronary Artery Disease, Coronary Artery Disease, N/A ?Hypertension, Type II Diabetes Hypertension, Type II Diabetes ?Date Acquired: 04/20/2020 04/20/2020 N/A ?Weeks of Treatment: 2 2 N/A ?Wound Status: Open Open N/A ?Wound Recurrence: No No N/A ?Measurements L x W x D (cm) 0.5x0.7x0.4 1.7x1.8x0.4 N/A ?Area (cm?) : 0.275 2.403 N/A ?Volume (cm?) : 0.11 0.961 N/A ?% Reduction in Area: -46.30% 15.00% N/A ?% Reduction in Volume: -46.70% -13.30% N/A ?Classification: Grade 2 Grade 2 N/A ?Exudate Amount: Medium Medium  N/A ?Exudate Type: Serosanguineous Serosanguineous N/A ?Exudate Color: red, brown red, brown N/A ?Granulation Amount: Large (67-100%) Medium (34-66%) N/A ?Granulation Quality: Red, Pink Red, Pink N/A ?Necrotic

## 2021-08-06 ENCOUNTER — Telehealth: Payer: Self-pay

## 2021-08-06 ENCOUNTER — Other Ambulatory Visit: Payer: Self-pay | Admitting: Physician Assistant

## 2021-08-06 DIAGNOSIS — G4719 Other hypersomnia: Secondary | ICD-10-CM

## 2021-08-06 MED ORDER — LISDEXAMFETAMINE DIMESYLATE 40 MG PO CAPS: 40.0000 mg | ORAL_CAPSULE | Freq: Every morning | ORAL | 0 refills | Status: DC

## 2021-08-06 NOTE — Telephone Encounter (Signed)
error 

## 2021-08-07 ENCOUNTER — Encounter: Payer: No Typology Code available for payment source | Admitting: Physician Assistant

## 2021-08-07 DIAGNOSIS — E11621 Type 2 diabetes mellitus with foot ulcer: Secondary | ICD-10-CM | POA: Diagnosis not present

## 2021-08-07 NOTE — Progress Notes (Signed)
Charles [name]. ([id number]) ?Visit Report for 08/07/2021 ?Arrival Information Details ?Patient Name: Charles [name], Charles [name] ?Date of Service: 08/07/2021 8:15 AM ?Medical Record Number: [id number] ?Patient Account Number: [id number] ?Date of Birth/Sex: August 10, 1960 (61 y.o. M) ?Treating RN: Levora Dredge ?Primary Care Maridee Slape: Drema Dallas Other Clinician: ?Referring Kamarah Bilotta: Drema Dallas ?Treating Nahomi Hegner/Extender: Jeri Cos ?Weeks in Treatment: 3 ?Visit Information History Since Last Visit ?Added or deleted any medications: No ?Patient Arrived: Ambulatory ?Any new allergies or adverse reactions: No ?Arrival Time: 07:56 ?Had a fall or experienced change in No ?Accompanied By: self ?activities of daily living that may affect ?Transfer Assistance: None ?risk of falls: ?Patient Identification Verified: Yes ?Hospitalized since last visit: No ?Secondary Verification Process Completed: Yes ?Has Dressing in Place as Prescribed: Yes ?Patient Requires Transmission-Based Precautions: No ?Has Footwear/Offloading in Place as Prescribed: Yes ?Patient Has Alerts: Yes ?Left: Surgical Shoe with Pressure Relief ?Patient Alerts: DIABETIC ?Insole ?Pain Present Now: Yes ?Electronic Signature(s) ?Signed: 08/07/2021 4:20:03 PM By: Levora Dredge ?Entered By: Levora Dredge on 08/07/2021 08:11:42 ?Charles [name], [name] ([id number]) ?-------------------------------------------------------------------------------- ?Clinic Level of Care Assessment Details ?Patient Name: Charles [name], Charles [name] ?Date of Service: 08/07/2021 8:15 AM ?Medical Record Number: [id number] ?Patient Account Number: [id number] ?Date of Birth/Sex: 08-Feb-1961 (61 y.o. M) ?Treating RN: Levora Dredge ?Primary Care Horace Lukas: Drema Dallas Other Clinician: ?Referring Demetreus Lothamer: Drema Dallas ?Treating Dakota Vanwart/Extender: Jeri Cos ?Weeks in Treatment: 3 ?Clinic Level of Care Assessment Items ?TOOL 1 Quantity Score ?'[]'$  - Use when EandM and Procedure is performed on  INITIAL visit 0 ?ASSESSMENTS - Nursing Assessment / Reassessment ?'[]'$  - General Physical Exam (combine w/ comprehensive assessment (listed just below) when performed on new ?0 ?pt. evals) ?'[]'$  - 0 ?Comprehensive Assessment (HX, ROS, Risk Assessments, Wounds Hx, etc.) ?ASSESSMENTS - Wound and Skin Assessment / Reassessment ?'[]'$  - Dermatologic / Skin Assessment (not related to wound area) 0 ?ASSESSMENTS - Ostomy and/or Continence Assessment and Care ?'[]'$  - Incontinence Assessment and Management 0 ?'[]'$  - 0 ?Ostomy Care Assessment and Management (repouching, etc.) ?PROCESS - Coordination of Care ?'[]'$  - Simple Patient / Family Education for ongoing care 0 ?'[]'$  - 0 ?Complex (extensive) Patient / Family Education for ongoing care ?'[]'$  - 0 ?Staff obtains Consents, Records, Test Results / Process Orders ?'[]'$  - 0 ?Staff telephones HHA, Nursing Homes / Clarify orders / etc ?'[]'$  - 0 ?Routine Transfer to another Facility (non-emergent condition) ?'[]'$  - 0 ?Routine Hospital Admission (non-emergent condition) ?'[]'$  - 0 ?New Admissions / Biomedical engineer / Ordering NPWT, Apligraf, etc. ?'[]'$  - 0 ?Emergency Hospital Admission (emergent condition) ?PROCESS - Special Needs ?'[]'$  - Pediatric / Minor Patient Management 0 ?'[]'$  - 0 ?Isolation Patient Management ?'[]'$  - 0 ?Hearing / Language / Visual special needs ?'[]'$  - 0 ?Assessment of Community assistance (transportation, D/C planning, etc.) ?'[]'$  - 0 ?Additional assistance / Altered mentation ?'[]'$  - 0 ?Support Surface(s) Assessment (bed, cushion, seat, etc.) ?INTERVENTIONS - Miscellaneous ?'[]'$  - External ear exam 0 ?'[]'$  - 0 ?Patient Transfer (multiple staff / Civil Service fast streamer / Similar devices) ?'[]'$  - 0 ?Simple Staple / Suture removal (25 or less) ?'[]'$  - 0 ?Complex Staple / Suture removal (26 or more) ?'[]'$  - 0 ?Hypo/Hyperglycemic Management (do not check if billed separately) ?'[]'$  - 0 ?Ankle / Brachial Index (ABI) - do not check if billed separately ?Has the patient been seen at the hospital within the last three  years: Yes ?Total Score: 0 ?Level Of Care: ____ [name] ([id number]) ?Electronic Signature(s) ?Signed: 08/07/2021 4:20:03 PM By: Levora Dredge ?Entered By: Levora Dredge  on 08/07/2021 09:05:51 ?Charles [name], [name] ([id number]) ?-------------------------------------------------------------------------------- ?Encounter Discharge Information Details ?Patient Name: Charles [name], Charles [name] ?Date of Service: 08/07/2021 8:15 AM ?Medical Record Number: [id number] ?Patient Account Number: [id number] ?Date of Birth/Sex: 01/24/1961 (61 y.o. M) ?Treating RN: Levora Dredge ?Primary Care Saraiyah Hemminger: Drema Dallas Other Clinician: ?Referring Garek Schuneman: Drema Dallas ?Treating Shemeca Lukasik/Extender: Jeri Cos ?Weeks in Treatment: 3 ?Encounter Discharge Information Items Post Procedure Vitals ?Discharge Condition: Stable ?Temperature (?F): 97.9 ?Ambulatory Status: Ambulatory ?Pulse (bpm): 63 ?Discharge Destination: Home ?Respiratory Rate (breaths/min): 18 ?Transportation: Private Auto ?Blood Pressure (mmHg): 154/91 ?Accompanied By: self/workers comp staff ?Schedule Follow-up Appointment: Yes ?Clinical Summary of Care: Patient Declined ?Electronic Signature(s) ?Signed: 08/07/2021 9:22:57 AM By: Levora Dredge ?Entered By: Levora Dredge on 08/07/2021 09:22:57 ?Charles [name], [name] ([id number]) ?-------------------------------------------------------------------------------- ?Lower Extremity Assessment Details ?Patient Name: Charles [name], Charles [name] ?Date of Service: 08/07/2021 8:15 AM ?Medical Record Number: [id number] ?Patient Account Number: [id number] ?Date of Birth/Sex: Apr 24, 1960 (61 y.o. M) ?Treating RN: Levora Dredge ?Primary Care Codylee Patil: Drema Dallas Other Clinician: ?Referring Diontre Harps: Drema Dallas ?Treating Linn Clavin/Extender: Jeri Cos ?Weeks in Treatment: 3 ?Edema Assessment ?Assessed: [Left: No] [Right: No] ?Edema: [Left: Ye] [Right: s] ?Calf ?Left: Right: ?Point of Measurement: 38 cm From Medial Instep 41.2  cm 37 cm ?Ankle ?Left: Right: ?Point of Measurement: 10 cm From Medial Instep 27 cm 23 cm ?Vascular Assessment ?Pulses: ?Dorsalis Pedis ?Palpable: [Left:Yes] [Right:Yes] ?Electronic Signature(s) ?Signed: 08/07/2021 4:20:03 PM By: Levora Dredge ?Entered By: Levora Dredge on 08/07/2021 08:27:48 ?Charles [name], [name] ([id number]) ?-------------------------------------------------------------------------------- ?Multi Wound Chart Details ?Patient Name: Charles [name], Charles [name] ?Date of Service: 08/07/2021 8:15 AM ?Medical Record Number: [id number] ?Patient Account Number: [id number] ?Date of Birth/Sex: [birth year] (61 y.o. M) ?Treating RN: Levora Dredge ?Primary Care Durward Matranga: Drema Dallas Other Clinician: ?Referring Dezyre Hoefer: Drema Dallas ?Treating Deja Kaigler/Extender: Jeri Cos ?Weeks in Treatment: 3 ?Vital Signs ?Height(in): 73 ?Pulse(bpm): 63 ?Weight(lbs): 230 ?Blood Pressure(mmHg): 154/91 ?Body Mass Index(BMI): 30.3 ?Temperature(??F): 97.9 ?Respiratory Rate(breaths/min): 18 ?Photos: [1:No Photos] ?Wound Location: Left Amputation Site - Toe Right, Plantar Foot Left Metatarsal head fifth ?Wounding Event: Trauma Gradually Appeared Gradually Appeared ?Primary Etiology: Dehisced Wound Diabetic Wound/Ulcer of the Lower Diabetic Wound/Ulcer of the Lower ?Extremity Extremity ?Comorbid History: Coronary Artery Disease, Coronary Artery Disease, Coronary Artery Disease, ?Hypertension, Type II Diabetes Hypertension, Type II Diabetes Hypertension, Type II Diabetes ?Date Acquired: 05/07/2021 04/20/2020 04/20/2020 ?Weeks of Treatment: '3 3 3 '$ ?Wound Status: Open Open Open ?Wound Recurrence: No No No ?Measurements L x W x D (cm) N/A 0.3x0.4x0.2 1.4x1.7x0.3 ?Area (cm?) : N/A 0.094 1.869 ?Volume (cm?) : N/A 0.019 0.561 ?% Reduction in Area: N/A 50.00% 33.90% ?% Reduction in Volume: N/A 74.70% 33.80% ?Classification: Full Thickness Without Exposed Grade 2 Grade 2 ?Support Structures ?Exudate Amount: Medium Medium Medium ?Exudate Type:  Serosanguineous Serosanguineous Serosanguineous ?Exudate Color: red, brown red, brown red, brown ?Granulation Amount: Medium (34-66%) Large (67-100%) Medium (34-66%) ?Granulation Quality: Red, Pink Red, Pink Red

## 2021-08-07 NOTE — Progress Notes (Addendum)
MEKIAH, CAMBRIDGE. ([id number]) ?Visit Report for 08/07/2021 ?Chief Complaint Document Details ?Patient Name: Charles [name], Charles [name] ?Date of Service: 08/07/2021 8:15 AM ?Medical Record Number: [id number] ?Patient Account Number: [id number] ?Date of Birth/Sex: 06-17-1960 (61 y.o. M) ?Treating RN: Levora Dredge ?Primary Care Provider: Drema Dallas Other Clinician: ?Referring Provider: Drema Dallas ?Treating Provider/Extender: Jeri Cos ?Weeks in Treatment: 3 ?Information Obtained from: Patient ?Chief Complaint ?Left lateral foot and right plantar foot ulcer - NON-Workers Comp Ulcers ?Electronic Signature(s) ?Signed: 08/07/2021 8:27:21 AM By: Worthy Keeler PA-C ?Entered By: Worthy Keeler on 08/07/2021 08:27:21 ?CROCHET, Colie S. ([id number]) ?-------------------------------------------------------------------------------- ?Debridement Details ?Patient Name: [name], Charles ?Date of Service: 08/07/2021 8:15 AM ?Medical Record Number: [id number] ?Patient Account Number: [id number] ?Date of Birth/Sex: February 07, 1961 (61 y.o. M) ?Treating RN: Levora Dredge ?Primary Care Provider: Drema Dallas Other Clinician: ?Referring Provider: Drema Dallas ?Treating Provider/Extender: Jeri Cos ?Weeks in Treatment: 3 ?Debridement Performed for ?Wound #3 Left Metatarsal head fifth ?Assessment: ?Performed By: Physician Tommie Sams., PA-C ?Debridement Type: Debridement ?Severity of Tissue Pre Debridement: Bone involvement without necrosis ?Level of Consciousness (Pre- ?Awake and Alert ?procedure): ?Pre-procedure Verification/Time Out ?Yes - 08:46 ?Taken: ?Total Area Debrided (L x W): 1.4 (cm) x 1.7 (cm) = 2.38 (cm?) ?Tissue and other material ?Viable, Non-Viable, Slough, Subcutaneous, Garner ?debrided: ?Level: Skin/Subcutaneous Tissue ?Debridement Description: Excisional ?Instrument: Curette ?Bleeding: Moderate ?Hemostasis Achieved: Pressure ?Response to Treatment: Procedure was tolerated well ?Level of Consciousness  (Post- ?Awake and Alert ?procedure): ?Post Debridement Measurements of Total Wound ?Length: (cm) 1.4 ?Width: (cm) 1.7 ?Depth: (cm) 0.3 ?Volume: (cm?) 0.561 ?Character of Wound/Ulcer Post Debridement: Stable ?Severity of Tissue Post Debridement: Bone involvement without necrosis ?Post Procedure Diagnosis ?Same as Pre-procedure ?Electronic Signature(s) ?Signed: 08/07/2021 4:20:03 PM By: Levora Dredge ?Signed: 08/08/2021 5:32:35 PM By: Worthy Keeler PA-C ?Entered By: Levora Dredge on 08/07/2021 08:49:28 ?ARNS, Tevin S. ([id number]) ?-------------------------------------------------------------------------------- ?Debridement Details ?Patient Name: [name], [name] ?Date of Service: 08/07/2021 8:15 AM ?Medical Record Number: [id number] ?Patient Account Number: [id number] ?Date of Birth/Sex: Sep 11, 1960 (61 y.o. M) ?Treating RN: Levora Dredge ?Primary Care Provider: Drema Dallas Other Clinician: ?Referring Provider: Drema Dallas ?Treating Provider/Extender: Jeri Cos ?Weeks in Treatment: 3 ?Debridement Performed for ?Wound #2 Right,Plantar Foot ?Assessment: ?Performed By: Physician Tommie Sams., PA-C ?Debridement Type: Debridement ?Severity of Tissue Pre Debridement: Fat layer exposed ?Level of Consciousness (Pre- ?Awake and Alert ?procedure): ?Pre-procedure Verification/Time Out ?Yes - 08:50 ?Taken: ?Total Area Debrided (L x W): 0.3 (cm) x 0.4 (cm) = 0.12 (cm?) ?Tissue and other material ?Viable, Non-Viable, Slough, Subcutaneous, [place] ?debrided: ?Level: Skin/Subcutaneous Tissue ?Debridement Description: Excisional ?Instrument: Curette ?Bleeding: Moderate ?Hemostasis Achieved: Pressure ?Response to Treatment: Procedure was tolerated well ?Level of Consciousness (Post- ?Awake and Alert ?procedure): ?Post Debridement Measurements of Total Wound ?Length: (cm) 0.3 ?Width: (cm) 0.4 ?Depth: (cm) 0.2 ?Volume: (cm?) 0.019 ?Character of Wound/Ulcer Post Debridement: Stable ?Severity of Tissue Post  Debridement: Fat layer exposed ?Post Procedure Diagnosis ?Same as Pre-procedure ?Electronic Signature(s) ?Signed: 08/07/2021 4:20:03 PM By: Levora Dredge ?Signed: 08/08/2021 5:32:35 PM By: Worthy Keeler PA-C ?Entered By: Levora Dredge on 08/07/2021 08:52:28 ?Maclain, Cohron Charley S. ([id number]) ?-------------------------------------------------------------------------------- ?HPI Details ?Patient Name: [name], Charles ?Date of Service: 08/07/2021 8:15 AM ?Medical Record Number: [id number] ?Patient Account Number: [id number] ?Date of Birth/Sex: 30-Sep-1960 (61 y.o. M) ?Treating RN: Levora Dredge ?Primary Care Provider: Drema Dallas Other Clinician: ?Referring Provider: Drema Dallas ?Treating Provider/Extender: Jeri Cos ?Weeks in Treatment: 3 ?History of Present Illness ?HPI Description: 5.7 diet controlled ?Workers Compensation Chart: ?07/15/2021 upon evaluation today  patient appears to be doing decently well in regard to a wound on the left foot first great toe amputation site. ?This is a wound that has been taken care of by Dr. Luana Shu up to this point at podiatry. He also has been seeing the patient for wounds that are ?non-Worker's Comp. related that will be dictated in a another note to this is actually the [employer] note currently. The patient sustained this ?injury when he was at work and apparently there was a machine that you stood beside that would come down to slice something that apparently ?his great toe got caught in the machine and removed a portion of this and ended up requiring a amputation of the digit completely. Subsequently ?this had for the most part healed but there was a small dehiscence and its not closed completely. Again this is the [employer] portion of his ?issues going on at this point. The good news is there does not appear to be any signs of infection right now which is great news. The patient does ?have several medical problems noted below. ?The patient does have  diabetes mellitus type 2, hypertension, and coronary artery disease. Of note however his hemoglobin A1c is actually very ?good at 5.7 and this is diet controlled. Overall this should both well for healing with appropriate offloading which I think is going to be probably ?the primary focus of what we will be doing. The good news is he is also had x-rays which did not show any evidence of osteomyelitis he has not ?had an MRI since surgery. ?With regard to the non-Worker's Comp. wounds he has 1 on the fifth metatarsal lateral region of the left foot as well as the plantar aspect of the ?right foot. Again these are not Worker's Comp. related and are being treated separately. Nonetheless we were not able to document a separate ?note between the [employer] and the regular at this point and therefore were combining everything as I wanted to be documented and not ?left out. ?07-24-2021 upon evaluation today patient appears to be doing a little better in regard to the wounds on the lateral aspect of his left foot as well as ?the right plantar foot. He is showing again some signs of increased granulation I am pleased in that regard. Fortunately I do not see any signs of ?active infection locally or systemically which is great news. No fevers, chills, nausea, vomiting, or diarrhea. I do think that potentially we may ?need to look into repeat evaluation possibly MRI of the left foot if things do not show signs of improvement previously in the area had a CT scan ?which showed no osteomyelitis but I believe that was January 2023. That could have changed in the interim and we need to keep a close eye on ?things here. ?07-31-2021 upon evaluation today patient appears to be doing well with regard to his wounds. He does have bone exposed on the left lateral foot ?although I could not identify any necrotic bone in particular. With that being said there is a lot of callus around the edges of the plantar foot right ?and I am going to  perform some debridement on both locations. ?08-07-2021 upon evaluation today patient appears to be doing well with regard to his wounds all things considered. The right plantar foot is doing ?better. The left lateral foot

## 2021-08-08 ENCOUNTER — Other Ambulatory Visit: Payer: Self-pay | Admitting: Physician Assistant

## 2021-08-08 DIAGNOSIS — F5101 Primary insomnia: Secondary | ICD-10-CM

## 2021-08-08 NOTE — Telephone Encounter (Signed)
Med sent to pharmacy x1 month ?

## 2021-08-08 NOTE — Telephone Encounter (Signed)
Last here4/10/23 ?

## 2021-08-14 ENCOUNTER — Other Ambulatory Visit: Payer: Self-pay | Admitting: Pain Medicine

## 2021-08-14 ENCOUNTER — Encounter: Payer: No Typology Code available for payment source | Admitting: Internal Medicine

## 2021-08-21 ENCOUNTER — Encounter: Payer: Managed Care, Other (non HMO) | Attending: Physician Assistant | Admitting: Physician Assistant

## 2021-08-21 DIAGNOSIS — I2510 Atherosclerotic heart disease of native coronary artery without angina pectoris: Secondary | ICD-10-CM | POA: Insufficient documentation

## 2021-08-21 DIAGNOSIS — E11621 Type 2 diabetes mellitus with foot ulcer: Secondary | ICD-10-CM | POA: Diagnosis not present

## 2021-08-21 DIAGNOSIS — L97522 Non-pressure chronic ulcer of other part of left foot with fat layer exposed: Secondary | ICD-10-CM | POA: Insufficient documentation

## 2021-08-21 DIAGNOSIS — L97512 Non-pressure chronic ulcer of other part of right foot with fat layer exposed: Secondary | ICD-10-CM | POA: Diagnosis not present

## 2021-08-21 DIAGNOSIS — M86672 Other chronic osteomyelitis, left ankle and foot: Secondary | ICD-10-CM | POA: Insufficient documentation

## 2021-08-21 DIAGNOSIS — I10 Essential (primary) hypertension: Secondary | ICD-10-CM | POA: Insufficient documentation

## 2021-08-21 NOTE — Progress Notes (Signed)
KOA, ZOELLER. ([id number]) ?Visit Report for 08/21/2021 ?Arrival Information Details ?Patient Name: Charles [name], Charles [name] ?Date of Service: 08/21/2021 11:00 AM ?Medical Record Number: [id number] ?Patient Account Number: [id number] ?Date of Birth/Sex: May 31, 1960 (61 y.o. M) ?Treating RN: Cornell Barman ?Primary Care Alif Petrak: Drema Dallas Other Clinician: ?Referring Flem Enderle: Drema Dallas ?Treating Chancey Ringel/Extender: Jeri Cos ?Weeks in Treatment: 5 ?Visit Information History Since Last Visit ?Added or deleted any medications: No ?Patient Arrived: Ambulatory ?Has Footwear/Offloading in Place as Prescribed: Yes ?Arrival Time: 11:26 ?Left: Surgical Shoe with Pressure Relief ?Accompanied By: self ?Insole ?Transfer Assistance: None ?Right: Surgical Shoe with Pressure Relief ?Patient Identification Verified: Yes ?Insole ?Secondary Verification Process Completed: Yes ?Pain Present Now: Yes ?Patient Requires Transmission-Based Precautions: No ?Patient Has Alerts: Yes ?Patient Alerts: DIABETIC ?Electronic Signature(s) ?Signed: 08/21/2021 5:29:17 PM By: Gretta Cool, BSN, RN, CWS, Kim RN, BSN ?Entered By: Gretta Cool, BSN, RN, CWS, Kim on 08/21/2021 11:29:25 ?SENNETT, Kirsten S. ([id number]) ?-------------------------------------------------------------------------------- ?Encounter Discharge Information Details ?Patient Name: Charles [name], Charles [name] ?Date of Service: 08/21/2021 11:00 AM ?Medical Record Number: [id number] ?Patient Account Number: [id number] ?Date of Birth/Sex: [birth year] (61 y.o. M) ?Treating RN: Cornell Barman ?Primary Care Mung Rinker: Drema Dallas Other Clinician: ?Referring Cleola Perryman: Drema Dallas ?Treating Gittel Mccamish/Extender: Jeri Cos ?Weeks in Treatment: 5 ?Encounter Discharge Information Items Post Procedure Vitals ?Discharge Condition: Stable ?Temperature (?F): 98.2 ?Ambulatory Status: Ambulatory ?Pulse (bpm): 105 ?Discharge Destination: Home ?Respiratory Rate (breaths/min): 16 ?Transportation: Private Auto ?Blood Pressure  (mmHg): 155/89 ?Accompanied By: self ?Schedule Follow-up Appointment: Yes ?Clinical Summary of Care: ?Electronic Signature(s) ?Signed: 08/21/2021 2:16:41 PM By: Gretta Cool, BSN, RN, CWS, Kim RN, BSN ?Entered By: Gretta Cool, BSN, RN, CWS, Kim on 08/21/2021 14:16:41 ?BRANDENBURG, [name] ([id number]) ?-------------------------------------------------------------------------------- ?Lower Extremity Assessment Details ?Patient Name: Charles [name], Charles [name] ?Date of Service: 08/21/2021 11:00 AM ?Medical Record Number: [id number] ?Patient Account Number: [id number] ?Date of Birth/Sex: 21-Jan-1961 (61 y.o. M) ?Treating RN: Cornell Barman ?Primary Care Jorja Empie: Drema Dallas Other Clinician: ?Referring Dayle Mcnerney: Drema Dallas ?Treating Kingjames Coury/Extender: Jeri Cos ?Weeks in Treatment: 5 ?Edema Assessment ?Assessed: [Left: Yes] [Right: Yes] ?Edema: [Left: No] [Right: No] ?Vascular Assessment ?Pulses: ?Dorsalis Pedis ?Palpable: [Left:Yes] [Right:Yes] ?Electronic Signature(s) ?Signed: 08/21/2021 5:29:17 PM By: Gretta Cool, BSN, RN, CWS, Kim RN, BSN ?Entered By: Gretta Cool, BSN, RN, CWS, Kim on 08/21/2021 11:40:13 ?Charles [name], [name] ([id number]) ?-------------------------------------------------------------------------------- ?Multi Wound Chart Details ?Patient Name: Charles [name], Charles [name] ?Date of Service: 08/21/2021 11:00 AM ?Medical Record Number: [id number] ?Patient Account Number: [id number] ?Date of Birth/Sex: 09/06/1960 (61 y.o. M) ?Treating RN: Cornell Barman ?Primary Care Daemion Mcniel: Drema Dallas Other Clinician: ?Referring Eulamae Greenstein: Drema Dallas ?Treating Caidence Kaseman/Extender: Jeri Cos ?Weeks in Treatment: 5 ?Vital Signs ?Height(in): 73 ?Pulse(bpm): 82 ?Weight(lbs): 230 ?Blood Pressure(mmHg): 157/100 ?Body Mass Index(BMI): 30.3 ?Temperature(??F): 97.8 ?Respiratory Rate(breaths/min): 18 ?Photos: ?Wound Location: Left Amputation Site - Toe Right, Plantar Foot Left Metatarsal head fifth ?Wounding Event: Trauma Gradually Appeared Gradually Appeared ?Primary  Etiology: Dehisced Wound Diabetic Wound/Ulcer of the Lower Diabetic Wound/Ulcer of the Lower ?Extremity Extremity ?Comorbid History: N/A Coronary Artery Disease, Coronary Artery Disease, ?Hypertension, Type II Diabetes Hypertension, Type II Diabetes ?Date Acquired: 05/07/2021 04/20/2020 04/20/2020 ?Weeks of Treatment: '5 5 5 '$ ?Wound Status: Open Open Open ?Wound Recurrence: No No No ?Measurements L x W x D (cm) 0.1x0.1x0.1 0.4x0.6x0.2 1.9x1.6x0.3 ?Area (cm?) : 0.008 0.188 2.388 ?Volume (cm?) : 0.001 0.038 0.716 ?% Reduction in Area: 99.60% 0.00% 15.50% ?% Reduction in Volume: 99.80% 49.30% 15.60% ?Classification: Full Thickness Without Exposed Grade 2 Grade 2 ?Support Structures ?Exudate Amount: Medium Medium Medium ?Exudate Type: Serosanguineous Serosanguineous Serosanguineous ?Exudate Color: red, brown red,  brown red, brown ?Granulation Amount: N/A Large (67-100%) Medium (34-66%) ?Granulation Quality: N/A Red, Pink Red, Pink ?Necrotic Amount: N/A Small (1-33%) Medium (34-66%) ?Epithelialization: N/A None None ?Treatment Notes ?Electronic Signature(s) ?Signed: 08/21/2021 5:29:17 PM By: Gretta Cool, BSN, RN, CWS, Kim RN, BSN ?Entered By: Gretta Cool, BSN, RN, CWS, Kim on 08/21/2021 11:49:36 ?Charles [name], [name] ([id number]) ?-------------------------------------------------------------------------------- ?Multi-Disciplinary Care Plan Details ?Patient Name: Charles [name], Charles [name] ?Date of Service: 08/21/2021 11:00 AM ?Medical Record Number: [id number] ?Patient Account Number: [id number] ?Date of Birth/Sex: December 31, 1960 (61 y.o. M) ?Treating RN: Cornell Barman ?Primary Care Mohan Erven: Drema Dallas Other Clinician: ?Referring Natosha Bou: Drema Dallas ?Treating Jamaurion Slemmer/Extender: Jeri Cos ?Weeks in Treatment: 5 ?Active Inactive ?Necrotic Tissue ?Nursing Diagnoses: ?Impaired tissue integrity related to necrotic/devitalized tissue ?Knowledge deficit related to management of necrotic/devitalized tissue ?Goals: ?Necrotic/devitalized tissue will be  minimized in the wound bed ?Date Initiated: 08/21/2021 ?Target Resolution Date: 08/21/2021 ?Goal Status: Active ?Patient/caregiver will verbalize understanding of reason and process for debridement of necrotic tissue ?Date Initiated: 08/21/2021 ?Target Resolution Date: 08/21/2021 ?Goal Status: Active ?Interventions: ?Assess patient pain level pre-, during and post procedure and prior to discharge ?Provide education on necrotic tissue and debridement process ?Treatment Activities: ?Apply topical anesthetic as ordered : 08/21/2021 ?Excisional debridement : 08/21/2021 ?Notes: ?Peripheral Neuropathy ?Nursing Diagnoses: ?Knowledge deficit related to disease process and management of peripheral neurovascular dysfunction ?Potential alteration in peripheral tissue perfusion (select prior to confirmation of diagnosis) ?Goals: ?Patient/caregiver will verbalize understanding of disease process and disease management ?Date Initiated: 08/21/2021 ?Target Resolution Date: 08/21/2021 ?Goal Status: Active ?Interventions: ?Assess signs and symptoms of neuropathy upon admission and as needed ?Provide education on Management of Neuropathy and Related Ulcers ?Provide education on Management of Neuropathy upon discharge from the [facility] ?Notes: ?Wound/Skin Impairment ?Nursing Diagnoses: ?Knowledge deficit related to ulceration/compromised skin integrity ?Goals: ?Patient/caregiver will verbalize understanding of skin care regimen ?Date Initiated: 07/15/2021 ?Target Resolution Date: 08/15/2021 ?Goal Status: Active ?Ulcer/skin breakdown will have a volume reduction of 30% by week 4 ?Date Initiated: 07/15/2021 ?Target Resolution Date: 08/15/2021 ?Goal Status: Active ?Charles [name], RIBEIRO. ([id number]) ?Ulcer/skin breakdown will have a volume reduction of 50% by week 8 ?Date Initiated: 07/15/2021 ?Target Resolution Date: 09/14/2021 ?Goal Status: Active ?Ulcer/skin breakdown will have a volume reduction of 80% by week 12 ?Date Initiated: 07/15/2021 ?Target  Resolution Date: 10/15/2021 ?Goal Status: Active ?Ulcer/skin breakdown will heal within 14 weeks ?Date Initiated: 07/15/2021 ?Target Resolution Date: 11/14/2021 ?Goal Status: Active ?Interventions: ?Assess patient/car

## 2021-08-21 NOTE — Progress Notes (Addendum)
CANDEN, CIESLINSKI. ([id number]) ?Visit Report for 08/21/2021 ?Chief Complaint Document Details ?Patient Name: EARLEY, GROBE ?Date of Service: 08/21/2021 11:00 AM ?Medical Record Number: [id number] ?Patient Account Number: [id number] ?Date of Birth/Sex: January 08, 1961 (61 y.o. M) ?Treating RN: Cornell Barman ?Primary Care Provider: Drema Dallas Other Clinician: ?Referring Provider: Drema Dallas ?Treating Provider/Extender: Jeri Cos ?Weeks in Treatment: 5 ?Information Obtained from: Patient ?Chief Complaint ?Left lateral foot and right plantar foot ulcer - NON-Workers Comp Ulcers ?Electronic Signature(s) ?Signed: 08/21/2021 11:42:02 AM By: Worthy Keeler PA-C ?Entered By: Worthy Keeler on 08/21/2021 11:42:02 ?FULLILOVE, Pankaj S. ([id number]) ?-------------------------------------------------------------------------------- ?Debridement Details ?Patient Name: KYRI, DAI ?Date of Service: 08/21/2021 11:00 AM ?Medical Record Number: [id number] ?Patient Account Number: [id number] ?Date of Birth/Sex: September 20, 1960 (61 y.o. M) ?Treating RN: Cornell Barman ?Primary Care Provider: Drema Dallas Other Clinician: ?Referring Provider: Drema Dallas ?Treating Provider/Extender: Jeri Cos ?Weeks in Treatment: 5 ?Debridement Performed for ?Wound #3 Left Metatarsal head fifth ?Assessment: ?Performed By: Physician Tommie Sams., PA-C ?Debridement Type: Debridement ?Severity of Tissue Pre Debridement: Limited to breakdown of skin ?Level of Consciousness (Pre- ?Awake and Alert ?procedure): ?Pre-procedure Verification/Time Out ?Yes - 11:45 ?Taken: ?Total Area Debrided (L x W): 1.9 (cm) x 1.6 (cm) = 3.04 (cm?) ?Tissue and other material ?[place], Subcutaneous, Biofilm, [place] ?debrided: ?Level: Skin/Subcutaneous Tissue ?Debridement Description: Excisional ?Instrument: Curette ?Bleeding: Moderate ?Hemostasis Achieved: Pressure ?Response to Treatment: Procedure was tolerated well ?Level of Consciousness (Post- ?Awake and  Alert ?procedure): ?Post Debridement Measurements of Total Wound ?Length: (cm) 1.9 ?Width: (cm) 1.6 ?Depth: (cm) 0.4 ?Volume: (cm?) 0.955 ?Character of Wound/Ulcer Post Debridement: Stable ?Severity of Tissue Post Debridement: Fat layer exposed ?Post Procedure Diagnosis ?Same as Pre-procedure ?Electronic Signature(s) ?Signed: 08/21/2021 5:29:17 PM By: Gretta Cool, BSN, RN, CWS, Kim RN, BSN ?Signed: 08/22/2021 4:41:50 PM By: Worthy Keeler PA-C ?Entered By: Gretta Cool, BSN, RN, CWS, Kim on 08/21/2021 11:50:44 ?LEMA, Ruby S. ([id number]) ?-------------------------------------------------------------------------------- ?Debridement Details ?Patient Name: JERRON, NIBLACK ?Date of Service: 08/21/2021 11:00 AM ?Medical Record Number: [id number] ?Patient Account Number: [id number] ?Date of Birth/Sex: Sep 16, 1960 (62 y.o. M) ?Treating RN: Cornell Barman ?Primary Care Provider: Drema Dallas Other Clinician: ?Referring Provider: Drema Dallas ?Treating Provider/Extender: Jeri Cos ?Weeks in Treatment: 5 ?Debridement Performed for ?Wound #2 Right,Plantar Foot ?Assessment: ?Performed By: Physician Tommie Sams., PA-C ?Debridement Type: Debridement ?Severity of Tissue Pre Debridement: Limited to breakdown of skin ?Level of Consciousness (Pre- ?Awake and Alert ?procedure): ?Pre-procedure Verification/Time Out ?Yes - 11:45 ?Taken: ?Total Area Debrided (L x W): 0.4 (cm) x 0.6 (cm) = 0.24 (cm?) ?Tissue and other material ?[place], Subcutaneous, Biofilm, [place] ?debrided: ?Level: Skin/Subcutaneous Tissue ?Debridement Description: Excisional ?Instrument: Curette ?Bleeding: Moderate ?Hemostasis Achieved: Pressure ?Response to Treatment: Procedure was tolerated well ?Level of Consciousness (Post- ?Awake and Alert ?procedure): ?Post Debridement Measurements of Total Wound ?Length: (cm) 0.4 ?Width: (cm) 0.6 ?Depth: (cm) 0.2 ?Volume: (cm?) 0.038 ?Character of Wound/Ulcer Post Debridement: Stable ?Severity of Tissue Post Debridement: Fat layer  exposed ?Post Procedure Diagnosis ?Same as Pre-procedure ?Electronic Signature(s) ?Signed: 08/21/2021 5:29:17 PM By: Gretta Cool, BSN, RN, CWS, Kim RN, BSN ?Signed: 08/22/2021 4:41:50 PM By: Worthy Keeler PA-C ?Entered By: Gretta Cool, BSN, RN, CWS, Kim on 08/21/2021 11:51:22 ?Khye, Hochstetler Srihith S. ([id number]) ?-------------------------------------------------------------------------------- ?HPI Details ?Patient Name: WAEL, MAESTAS ?Date of Service: 08/21/2021 11:00 AM ?Medical Record Number: [id number] ?Patient Account Number: [id number] ?Date of Birth/Sex: 1961-01-05 (61 y.o. M) ?Treating RN: Cornell Barman ?Primary Care Provider: Drema Dallas Other Clinician: ?Referring Provider: Drema Dallas ?Treating Provider/Extender: Jeri Cos ?Weeks in Treatment: 5 ?  History of Present Illness ?HPI Description: 5.7 diet controlled ?Workers Compensation Chart: ?07/15/2021 upon evaluation today patient appears to be doing decently well in regard to a wound on the left foot first great toe amputation site. ?This is a wound that has been taken care of by Dr. Luana Shu up to this point at podiatry. He also has been seeing the patient for wounds that are ?non-Worker's Comp. related that will be dictated in a another note to this is actually the [employer] note currently. The patient sustained this ?injury when he was at work and apparently there was a machine that you stood beside that would come down to slice something that apparently ?his great toe got caught in the machine and removed a portion of this and ended up requiring a amputation of the digit completely. Subsequently ?this had for the most part healed but there was a small dehiscence and its not closed completely. Again this is the [employer] portion of his ?issues going on at this point. The good news is there does not appear to be any signs of infection right now which is great news. The patient does ?have several medical problems noted below. ?The patient does have diabetes  mellitus type 2, hypertension, and coronary artery disease. Of note however his hemoglobin A1c is actually very ?good at 5.7 and this is diet controlled. Overall this should both well for healing with appropriate offloading which I think is going to be probably ?the primary focus of what we will be doing. The good news is he is also had x-rays which did not show any evidence of osteomyelitis he has not ?had an MRI since surgery. ?With regard to the non-Worker's Comp. wounds he has 1 on the fifth metatarsal lateral region of the left foot as well as the plantar aspect of the ?right foot. Again these are not Worker's Comp. related and are being treated separately. Nonetheless we were not able to document a separate ?note between the [employer] and the regular at this point and therefore were combining everything as I wanted to be documented and not ?left out. ?07-24-2021 upon evaluation today patient appears to be doing a little better in regard to the wounds on the lateral aspect of his left foot as well as ?the right plantar foot. He is showing again some signs of increased granulation I am pleased in that regard. Fortunately I do not see any signs of ?active infection locally or systemically which is great news. No fevers, chills, nausea, vomiting, or diarrhea. I do think that potentially we may ?need to look into repeat evaluation possibly MRI of the left foot if things do not show signs of improvement previously in the area had a CT scan ?which showed no osteomyelitis but I believe that was January 2023. That could have changed in the interim and we need to keep a close eye on ?things here. ?07-31-2021 upon evaluation today patient appears to be doing well with regard to his wounds. He does have bone exposed on the left lateral foot ?although I could not identify any necrotic bone in particular. With that being said there is a lot of callus around the edges of the plantar foot right ?and I am going to perform  some debridement on both locations. ?08-07-2021 upon evaluation today patient appears to be doing well with regard to his wounds all things considered. The right plantar foot is doing ?better. The left lateral foot unfor

## 2021-08-28 ENCOUNTER — Encounter: Payer: Managed Care, Other (non HMO) | Admitting: Physician Assistant

## 2021-08-28 DIAGNOSIS — M869 Osteomyelitis, unspecified: Secondary | ICD-10-CM | POA: Diagnosis not present

## 2021-08-28 NOTE — Progress Notes (Addendum)
Charles [name]. ([id number]) ?Visit Report for 08/28/2021 ?Chief Complaint Document Details ?Patient Name: Charles [name], Charles [name] ?Date of Service: 08/28/2021 8:15 AM ?Medical Record Number: [id number] ?Patient Account Number: [id number] ?Date of Birth/Sex: 02-Dec-1960 (61 y.o. M) ?Treating RN: Levora Dredge ?Primary Care Provider: Drema Dallas Other Clinician: ?Referring Provider: Drema Dallas ?Treating Provider/Extender: Jeri Cos ?Weeks in Treatment: 6 ?Information Obtained from: Patient ?Chief Complaint ?Left lateral foot and right plantar foot ulcer - NON-Workers Comp Ulcers ?Electronic Signature(s) ?Signed: 08/28/2021 8:35:22 AM By: Worthy Keeler PA-C ?Entered By: Worthy Keeler on 08/28/2021 08:35:22 ?[name], [name] ([id number]) ?-------------------------------------------------------------------------------- ?Debridement Details ?Patient Name: Charles [name] ?Date of Service: 08/28/2021 8:15 AM ?Medical Record Number: [id number] ?Patient Account Number: [id number] ?Date of Birth/Sex: 1960/07/24 (61 y.o. M) ?Treating RN: Levora Dredge ?Primary Care Provider: Drema Dallas Other Clinician: ?Referring Provider: Drema Dallas ?Treating Provider/Extender: Jeri Cos ?Weeks in Treatment: 6 ?Debridement Performed for ?Wound #4 Left Toe Third ?Assessment: ?Performed By: Physician Tommie Sams., PA-C ?Debridement Type: Debridement ?Level of Consciousness (Pre- ?Awake and Alert ?procedure): ?Pre-procedure Verification/Time Out ?Yes - 08:39 ?Taken: ?Total Area Debrided (L x W): 2 (cm) x 1 (cm) = 2 (cm?) ?Tissue and other material ?Non-Viable, Skin: Dermis , Skin: Epidermis ?debrided: ?Level: Skin/Epidermis ?Debridement Description: Selective/Open Wound ?Instrument: Curette ?Bleeding: None ?Response to Treatment: Procedure was tolerated well ?Level of Consciousness (Post- ?Awake and Alert ?procedure): ?Post Debridement Measurements of Total Wound ?Length: (cm) 2 ?Width: (cm) 1 ?Depth: (cm)  0.1 ?Volume: (cm?) 0.157 ?Character of Wound/Ulcer Post Debridement: Stable ?Post Procedure Diagnosis ?Same as Pre-procedure ?Electronic Signature(s) ?Signed: 08/28/2021 4:39:51 PM By: Levora Dredge ?Signed: 08/29/2021 8:03:27 AM By: Worthy Keeler PA-C ?Entered By: Levora Dredge on 08/28/2021 08:39:42 ?[name], [name] ([id number]) ?-------------------------------------------------------------------------------- ?HPI Details ?Patient Name: Charles [name], Charles [name] ?Date of Service: 08/28/2021 8:15 AM ?Medical Record Number: [id number] ?Patient Account Number: [id number] ?Date of Birth/Sex: 02-05-1961 (61 y.o. M) ?Treating RN: Levora Dredge ?Primary Care Provider: Drema Dallas Other Clinician: ?Referring Provider: Drema Dallas ?Treating Provider/Extender: Jeri Cos ?Weeks in Treatment: 6 ?History of Present Illness ?HPI Description: 5.7 diet controlled ?Workers Compensation Chart: ?07/15/2021 upon evaluation today patient appears to be doing decently well in regard to a wound on the left foot first great toe amputation site. ?This is a wound that has been taken care of by Dr. Luana Shu up to this point at podiatry. He also has been seeing the patient for wounds that are ?non-Worker's Comp. related that will be dictated in a another note to this is actually the [employer] note currently. The patient sustained this ?injury when he was at work and apparently there was a machine that you stood beside that would come down to slice something that apparently ?his great toe got caught in the machine and removed a portion of this and ended up requiring a amputation of the digit completely. Subsequently ?this had for the most part healed but there was a small dehiscence and its not closed completely. Again this is the [employer] portion of his ?issues going on at this point. The good news is there does not appear to be any signs of infection right now which is great news. The patient does ?have several medical  problems noted below. ?The patient does have diabetes mellitus type 2, hypertension, and coronary artery disease. Of note however his hemoglobin A1c is actually very ?good at 5.7 and this is diet controlled. Overall this should both well for healing with appropriate offloading which I think is going to  be probably ?the primary focus of what we will be doing. The good news is he is also had x-rays which did not show any evidence of osteomyelitis he has not ?had an MRI since surgery. ?With regard to the non-Worker's Comp. wounds he has 1 on the fifth metatarsal lateral region of the left foot as well as the plantar aspect of the ?right foot. Again these are not Worker's Comp. related and are being treated separately. Nonetheless we were not able to document a separate ?note between the [employer] and the regular at this point and therefore were combining everything as I wanted to be documented and not ?left out. ?07-24-2021 upon evaluation today patient appears to be doing a little better in regard to the wounds on the lateral aspect of his left foot as well as ?the right plantar foot. He is showing again some signs of increased granulation I am pleased in that regard. Fortunately I do not see any signs of ?active infection locally or systemically which is great news. No fevers, chills, nausea, vomiting, or diarrhea. I do think that potentially we may ?need to look into repeat evaluation possibly MRI of the left foot if things do not show signs of improvement previously in the area had a CT scan ?which showed no osteomyelitis but I believe that was January 2023. That could have changed in the interim and we need to keep a close eye on ?things here. ?07-31-2021 upon evaluation today patient appears to be doing well with regard to his wounds. He does have bone exposed on the left lateral foot ?although I could not identify any necrotic bone in particular. With that being said there is a lot of callus around the edges  of the plantar foot right ?and I am going to perform some debridement on both locations. ?08-07-2021 upon evaluation today patient appears to be doing well with regard to his wounds all things considered. The right plantar foot is doing ?better. The left lateral foot unfortunately still shows bone in the base of the wound. There is some concern for the possibility of osteomyelitis ?based on what we are seeing his previous CT scan did not show this but that was back in January 3 months past. At this point the wound does ?appear to be deeper with bone exposure and subsequently I do believe that there is a great risk year of there actually being some infection ?present. Subsequently we have attempted to order a repeat CT scan this has been denied by insurance I have no documentation as to why that ?may be. The patient has not received anything and this was the first he heard of it this morning we just found out yesterday. Nonetheless I am ?going to have him contact them he offered and stated that he would give them a call anyway I am definitely okay with that. Depending on what ?he can find out or if he can even get any information for me to get in touch with someone I will be happy to discuss the case with a peer to peer ?reviewer as well. ?08-21-2021 upon evaluation today patient appears to be doing okay in regard to his wounds. He still has not found out any information regarding ?the CT scan with his insurance. We are going to need to try to follow-up on this as honestly he really does need a CT scan to see what exactly is ?going on and to be honest I think that a peer to peer review would  likely achieve the what we need as far as getting this done. He did have a ?repeat x-ray with podiatry and apparently they saw some stuff that did make him concerned about the fact that he needs the CT scan as well they ?are unsure why he is being denied. I am in complete agreement as far as that is concerned. ?08-28-2021 upon  evaluation today patient unfortunately appears to be doing significantly worse in regard to his left leg. He actually has erythema ?all the way up from the foot to his knee. This is significantly swollen as well. Again we

## 2021-08-28 NOTE — Progress Notes (Signed)
Charles [name], Charles [name]. ([id number]) ?Visit Report for 08/28/2021 ?Arrival Information Details ?Patient Name: Charles [name], Charles [name] ?Date of Service: 08/28/2021 8:15 AM ?Medical Record Number: [id number] ?Patient Account Number: [id number] ?Date of Birth/Sex: 12-28-1960 (61 y.o. M) ?Treating RN: Levora Dredge ?Primary Care Verena Shawgo: Drema Dallas Other Clinician: ?Referring Forbes Loll: Drema Dallas ?Treating Tennessee Hanlon/Extender: Jeri Cos ?Weeks in Treatment: 6 ?Visit Information History Since Last Visit ?Added or deleted any medications: No ?Patient Arrived: Ambulatory ?Any new allergies or adverse reactions: No ?Arrival Time: 08:13 ?Had a fall or experienced change in No ?Accompanied By: self ?activities of daily living that may affect ?Transfer Assistance: None ?risk of falls: ?Patient Identification Verified: Yes ?Hospitalized since last visit: No ?Secondary Verification Process Completed: Yes ?Has Dressing in Place as Prescribed: Yes ?Patient Requires Transmission-Based Precautions: No ?Pain Present Now: Yes ?Patient Has Alerts: Yes ?Patient Alerts: DIABETIC ?Electronic Signature(s) ?Signed: 08/28/2021 4:39:51 PM By: Levora Dredge ?Entered By: Levora Dredge on 08/28/2021 08:13:50 ?Charles [name], [name] ([id number]) ?-------------------------------------------------------------------------------- ?Clinic Level of Care Assessment Details ?Patient Name: Charles [name], Charles [name] ?Date of Service: 08/28/2021 8:15 AM ?Medical Record Number: [id number] ?Patient Account Number: [id number] ?Date of Birth/Sex: 08-Oct-1960 (61 y.o. M) ?Treating RN: Levora Dredge ?Primary Care Loretta Doutt: Drema Dallas Other Clinician: ?Referring Rodrecus Belsky: Drema Dallas ?Treating Taunja Brickner/Extender: Jeri Cos ?Weeks in Treatment: 6 ?Clinic Level of Care Assessment Items ?TOOL 1 Quantity Score ?'[]'$  - Use when EandM and Procedure is performed on INITIAL visit 0 ?ASSESSMENTS - Nursing Assessment / Reassessment ?'[]'$  - General Physical Exam (combine w/  comprehensive assessment (listed just below) when performed on new ?0 ?pt. evals) ?'[]'$  - 0 ?Comprehensive Assessment (HX, ROS, Risk Assessments, Wounds Hx, etc.) ?ASSESSMENTS - Wound and Skin Assessment / Reassessment ?'[]'$  - Dermatologic / Skin Assessment (not related to wound area) 0 ?ASSESSMENTS - Ostomy and/or Continence Assessment and Care ?'[]'$  - Incontinence Assessment and Management 0 ?'[]'$  - 0 ?Ostomy Care Assessment and Management (repouching, etc.) ?PROCESS - Coordination of Care ?'[]'$  - Simple Patient / Family Education for ongoing care 0 ?'[]'$  - 0 ?Complex (extensive) Patient / Family Education for ongoing care ?'[]'$  - 0 ?Staff obtains Consents, Records, Test Results / Process Orders ?'[]'$  - 0 ?Staff telephones HHA, Nursing Homes / Clarify orders / etc ?'[]'$  - 0 ?Routine Transfer to another Facility (non-emergent condition) ?'[]'$  - 0 ?Routine Hospital Admission (non-emergent condition) ?'[]'$  - 0 ?New Admissions / Biomedical engineer / Ordering NPWT, Apligraf, etc. ?'[]'$  - 0 ?Emergency Hospital Admission (emergent condition) ?PROCESS - Special Needs ?'[]'$  - Pediatric / Minor Patient Management 0 ?'[]'$  - 0 ?Isolation Patient Management ?'[]'$  - 0 ?Hearing / Language / Visual special needs ?'[]'$  - 0 ?Assessment of Community assistance (transportation, D/C planning, etc.) ?'[]'$  - 0 ?Additional assistance / Altered mentation ?'[]'$  - 0 ?Support Surface(s) Assessment (bed, cushion, seat, etc.) ?INTERVENTIONS - Miscellaneous ?'[]'$  - External ear exam 0 ?'[]'$  - 0 ?Patient Transfer (multiple staff / Civil Service fast streamer / Similar devices) ?'[]'$  - 0 ?Simple Staple / Suture removal (25 or less) ?'[]'$  - 0 ?Complex Staple / Suture removal (26 or more) ?'[]'$  - 0 ?Hypo/Hyperglycemic Management (do not check if billed separately) ?'[]'$  - 0 ?Ankle / Brachial Index (ABI) - do not check if billed separately ?Has the patient been seen at the hospital within the last three years: Yes ?Total Score: 0 ?Level Of Care: ____ Charles [name] ([id number]) ?Electronic  Signature(s) ?Signed: 08/28/2021 4:39:51 PM By: Levora Dredge ?Entered By: Levora Dredge on 08/28/2021 08:54:13 ?Charles [name], [name] ([id number]) ?-------------------------------------------------------------------------------- ?Encounter Discharge Information Details ?Patient Name:  Charles [name], [name] ?Date of Service: 08/28/2021 8:15 AM ?Medical Record Number: [id number] ?Patient Account Number: [id number] ?Date of Birth/Sex: [birth year] (61 y.o. M) ?Treating RN: Levora Dredge ?Primary Care Camilah Spillman: Drema Dallas Other Clinician: ?Referring Bryssa Tones: Drema Dallas ?Treating Yoshiko Keleher/Extender: Jeri Cos ?Weeks in Treatment: 6 ?Encounter Discharge Information Items Post Procedure Vitals ?Discharge Condition: Stable ?Temperature (?F): 97.7 ?Ambulatory Status: Ambulatory ?Pulse (bpm): 72 ?Discharge Destination: Emergency Room ?Respiratory Rate (breaths/min): 18 ?Telephoned: No ?Blood Pressure (mmHg): 124/67 ?Orders Sent: No ?Transportation: Private Auto ?Accompanied By: self ?Schedule Follow-up Appointment: Yes ?Clinical Summary of Care: Patient Declined ?Notes ?PA Stone and patient agreed that patient will head to ED after work, PA Stone sending patient with a note to notify ED of current status when ?patient arrives. ?Electronic Signature(s) ?Signed: 08/28/2021 4:39:51 PM By: Levora Dredge ?Entered By: Levora Dredge on 08/28/2021 08:56:21 ?Charles [name], [name] ([id number]) ?-------------------------------------------------------------------------------- ?Lower Extremity Assessment Details ?Patient Name: Charles [name], Charles [name] ?Date of Service: 08/28/2021 8:15 AM ?Medical Record Number: [id number] ?Patient Account Number: [id number] ?Date of Birth/Sex: Feb 20, 1961 (61 y.o. M) ?Treating RN: Levora Dredge ?Primary Care Towana Stenglein: Drema Dallas Other Clinician: ?Referring Tanji Storrs: Drema Dallas ?Treating Darenda Fike/Extender: Jeri Cos ?Weeks in Treatment: 6 ?Edema Assessment ?Assessed: [Left: No] [Right:  No] ?Edema: [Left: Yes] [Right: Yes] ?Calf ?Left: Right: ?Point of Measurement: 35 cm From Medial Instep 47.5 cm 37.5 cm ?Ankle ?Left: Right: ?Point of Measurement: 11 cm From Medial Instep 34.5 cm 24.5 cm ?Vascular Assessment ?Pulses: ?Dorsalis Pedis ?Palpable: [Left:Yes] [Right:Yes] ?Notes ?left lower leg is swollen red and painful, pt states has been like this for about 2 days. ?Electronic Signature(s) ?Signed: 08/28/2021 4:39:51 PM By: Levora Dredge ?Entered By: Levora Dredge on 08/28/2021 08:30:41 ?Charles [name], [name] ([id number]) ?-------------------------------------------------------------------------------- ?Multi Wound Chart Details ?Patient Name: Charles [name], Charles [name] ?Date of Service: 08/28/2021 8:15 AM ?Medical Record Number: [id number] ?Patient Account Number: [id number] ?Date of Birth/Sex: 08/07/1960 (61 y.o. M) ?Treating RN: Levora Dredge ?Primary Care Everest Brod: Drema Dallas Other Clinician: ?Referring Darlis Wragg: Drema Dallas ?Treating Lutie Pickler/Extender: Jeri Cos ?Weeks in Treatment: 6 ?Vital Signs ?Height(in): 73 ?Pulse(bpm): 72 ?Weight(lbs): 230 ?Blood Pressure(mmHg): 124/67 ?Body Mass Index(BMI): 30.3 ?Temperature(??F): 97.7 ?Respiratory Rate(breaths/min): 18 ?Photos: ?Wound Location: Left Amputation Site - Toe Right, Plantar Foot Left Metatarsal head fifth ?Wounding Event: Trauma Gradually Appeared Gradually Appeared ?Primary Etiology: Dehisced Wound Diabetic Wound/Ulcer of the Lower Diabetic Wound/Ulcer of the Lower ?Extremity Extremity ?Comorbid History: Coronary Artery Disease, Coronary Artery Disease, Coronary Artery Disease, ?Hypertension, Type II Diabetes Hypertension, Type II Diabetes Hypertension, Type II Diabetes ?Date Acquired: 05/07/2021 04/20/2020 04/20/2020 ?Weeks of Treatment: '6 6 6 '$ ?Wound Status: Open Open Open ?Wound Recurrence: No No No ?Measurements L x W x D (cm) 0.1x0.1x0.1 0.3x0.3x0.2 1.1x1.8x0.3 ?Area (cm?) : 0.008 0.071 1.555 ?Volume (cm?) : 0.001 0.014 0.467 ?% Reduction  in Area: 99.60% 62.20% 45.00% ?% Reduction in Volume: 99.80% 81.30% 44.90% ?Starting Position 1 (o'clock): 11 ?Ending Position 1 (o'clock): 1 ?Maximum Distance 1 (cm): 0.2 ?Undermining: No Yes No ?Classification: Full Thick

## 2021-08-29 ENCOUNTER — Emergency Department: Payer: Managed Care, Other (non HMO)

## 2021-08-29 ENCOUNTER — Inpatient Hospital Stay
Admission: EM | Admit: 2021-08-29 | Discharge: 2021-09-02 | DRG: 505 | Disposition: A | Discharge status: Home / Self Care | Payer: Managed Care, Other (non HMO) | Attending: Internal Medicine | Admitting: Internal Medicine

## 2021-08-29 ENCOUNTER — Other Ambulatory Visit: Payer: Self-pay

## 2021-08-29 ENCOUNTER — Inpatient Hospital Stay: Payer: Managed Care, Other (non HMO)

## 2021-08-29 DIAGNOSIS — E876 Hypokalemia: Secondary | ICD-10-CM | POA: Diagnosis present

## 2021-08-29 DIAGNOSIS — M109 Gout, unspecified: Secondary | ICD-10-CM | POA: Diagnosis present

## 2021-08-29 DIAGNOSIS — M8610 Other acute osteomyelitis, unspecified site: Secondary | ICD-10-CM

## 2021-08-29 DIAGNOSIS — M869 Osteomyelitis, unspecified: Principal | ICD-10-CM | POA: Diagnosis present

## 2021-08-29 DIAGNOSIS — Z9852 Vasectomy status: Secondary | ICD-10-CM

## 2021-08-29 DIAGNOSIS — Z9861 Coronary angioplasty status: Secondary | ICD-10-CM | POA: Diagnosis not present

## 2021-08-29 DIAGNOSIS — I10 Essential (primary) hypertension: Secondary | ICD-10-CM | POA: Diagnosis present

## 2021-08-29 DIAGNOSIS — I959 Hypotension, unspecified: Secondary | ICD-10-CM | POA: Diagnosis present

## 2021-08-29 DIAGNOSIS — Z86010 Personal history of colonic polyps: Secondary | ICD-10-CM

## 2021-08-29 DIAGNOSIS — F909 Attention-deficit hyperactivity disorder, unspecified type: Secondary | ICD-10-CM | POA: Diagnosis present

## 2021-08-29 DIAGNOSIS — M86472 Chronic osteomyelitis with draining sinus, left ankle and foot: Secondary | ICD-10-CM | POA: Diagnosis not present

## 2021-08-29 DIAGNOSIS — E669 Obesity, unspecified: Secondary | ICD-10-CM | POA: Diagnosis present

## 2021-08-29 DIAGNOSIS — G629 Polyneuropathy, unspecified: Secondary | ICD-10-CM | POA: Diagnosis present

## 2021-08-29 DIAGNOSIS — K219 Gastro-esophageal reflux disease without esophagitis: Secondary | ICD-10-CM | POA: Diagnosis present

## 2021-08-29 DIAGNOSIS — M7989 Other specified soft tissue disorders: Secondary | ICD-10-CM | POA: Diagnosis present

## 2021-08-29 DIAGNOSIS — Z833 Family history of diabetes mellitus: Secondary | ICD-10-CM

## 2021-08-29 DIAGNOSIS — Z87442 Personal history of urinary calculi: Secondary | ICD-10-CM

## 2021-08-29 DIAGNOSIS — E785 Hyperlipidemia, unspecified: Secondary | ICD-10-CM | POA: Diagnosis present

## 2021-08-29 DIAGNOSIS — J45909 Unspecified asthma, uncomplicated: Secondary | ICD-10-CM | POA: Diagnosis present

## 2021-08-29 DIAGNOSIS — B952 Enterococcus as the cause of diseases classified elsewhere: Secondary | ICD-10-CM | POA: Diagnosis present

## 2021-08-29 DIAGNOSIS — N400 Enlarged prostate without lower urinary tract symptoms: Secondary | ICD-10-CM | POA: Diagnosis present

## 2021-08-29 DIAGNOSIS — G4700 Insomnia, unspecified: Secondary | ICD-10-CM | POA: Diagnosis present

## 2021-08-29 DIAGNOSIS — Z8711 Personal history of peptic ulcer disease: Secondary | ICD-10-CM

## 2021-08-29 DIAGNOSIS — I2510 Atherosclerotic heart disease of native coronary artery without angina pectoris: Secondary | ICD-10-CM | POA: Diagnosis present

## 2021-08-29 DIAGNOSIS — I252 Old myocardial infarction: Secondary | ICD-10-CM

## 2021-08-29 DIAGNOSIS — Z955 Presence of coronary angioplasty implant and graft: Secondary | ICD-10-CM

## 2021-08-29 DIAGNOSIS — Z89422 Acquired absence of other left toe(s): Secondary | ICD-10-CM

## 2021-08-29 DIAGNOSIS — Z888 Allergy status to other drugs, medicaments and biological substances status: Secondary | ICD-10-CM

## 2021-08-29 DIAGNOSIS — F32A Depression, unspecified: Secondary | ICD-10-CM | POA: Diagnosis present

## 2021-08-29 DIAGNOSIS — L089 Local infection of the skin and subcutaneous tissue, unspecified: Secondary | ICD-10-CM | POA: Diagnosis not present

## 2021-08-29 DIAGNOSIS — F419 Anxiety disorder, unspecified: Secondary | ICD-10-CM | POA: Diagnosis present

## 2021-08-29 DIAGNOSIS — Z8616 Personal history of COVID-19: Secondary | ICD-10-CM | POA: Diagnosis not present

## 2021-08-29 DIAGNOSIS — M549 Dorsalgia, unspecified: Secondary | ICD-10-CM | POA: Diagnosis present

## 2021-08-29 DIAGNOSIS — E039 Hypothyroidism, unspecified: Secondary | ICD-10-CM | POA: Diagnosis present

## 2021-08-29 DIAGNOSIS — F329 Major depressive disorder, single episode, unspecified: Secondary | ICD-10-CM | POA: Diagnosis not present

## 2021-08-29 DIAGNOSIS — Z79890 Hormone replacement therapy (postmenopausal): Secondary | ICD-10-CM

## 2021-08-29 DIAGNOSIS — M79662 Pain in left lower leg: Secondary | ICD-10-CM

## 2021-08-29 DIAGNOSIS — L03116 Cellulitis of left lower limb: Principal | ICD-10-CM

## 2021-08-29 DIAGNOSIS — G8929 Other chronic pain: Secondary | ICD-10-CM | POA: Diagnosis present

## 2021-08-29 DIAGNOSIS — R7301 Impaired fasting glucose: Secondary | ICD-10-CM | POA: Diagnosis present

## 2021-08-29 DIAGNOSIS — Z89412 Acquired absence of left great toe: Secondary | ICD-10-CM

## 2021-08-29 DIAGNOSIS — Z79899 Other long term (current) drug therapy: Secondary | ICD-10-CM

## 2021-08-29 DIAGNOSIS — D638 Anemia in other chronic diseases classified elsewhere: Secondary | ICD-10-CM | POA: Diagnosis present

## 2021-08-29 DIAGNOSIS — L98494 Non-pressure chronic ulcer of skin of other sites with necrosis of bone: Secondary | ICD-10-CM | POA: Diagnosis present

## 2021-08-29 DIAGNOSIS — Z6831 Body mass index (BMI) 31.0-31.9, adult: Secondary | ICD-10-CM

## 2021-08-29 DIAGNOSIS — Z89421 Acquired absence of other right toe(s): Secondary | ICD-10-CM

## 2021-08-29 DIAGNOSIS — Z7982 Long term (current) use of aspirin: Secondary | ICD-10-CM

## 2021-08-29 DIAGNOSIS — Z9884 Bariatric surgery status: Secondary | ICD-10-CM

## 2021-08-29 DIAGNOSIS — G4730 Sleep apnea, unspecified: Secondary | ICD-10-CM | POA: Diagnosis present

## 2021-08-29 LAB — MAGNESIUM: Magnesium: 2.3 mg/dL (ref 1.7–2.4)

## 2021-08-29 LAB — CBC WITH DIFFERENTIAL/PLATELET
Abs Immature Granulocytes: 0.02 10*3/uL (ref 0.00–0.07)
Basophils Absolute: 0 10*3/uL (ref 0.0–0.1)
Basophils Relative: 1 %
Eosinophils Absolute: 0.2 10*3/uL (ref 0.0–0.5)
Eosinophils Relative: 3 %
HCT: 35.1 % — ABNORMAL LOW (ref 39.0–52.0)
Hemoglobin: 10.9 g/dL — ABNORMAL LOW (ref 13.0–17.0)
Immature Granulocytes: 0 %
Lymphocytes Relative: 21 %
Lymphs Abs: 1.3 10*3/uL (ref 0.7–4.0)
MCH: 27.7 pg (ref 26.0–34.0)
MCHC: 31.1 g/dL (ref 30.0–36.0)
MCV: 89.1 fL (ref 80.0–100.0)
Monocytes Absolute: 0.6 10*3/uL (ref 0.1–1.0)
Monocytes Relative: 9 %
Neutro Abs: 4.1 10*3/uL (ref 1.7–7.7)
Neutrophils Relative %: 66 %
Platelets: 335 10*3/uL (ref 150–400)
RBC: 3.94 MIL/uL — ABNORMAL LOW (ref 4.22–5.81)
RDW: 13.6 % (ref 11.5–15.5)
WBC: 6.3 10*3/uL (ref 4.0–10.5)
nRBC: 0 % (ref 0.0–0.2)

## 2021-08-29 LAB — COMPREHENSIVE METABOLIC PANEL
ALT: 26 U/L (ref 0–44)
AST: 45 U/L — ABNORMAL HIGH (ref 15–41)
Albumin: 3.2 g/dL — ABNORMAL LOW (ref 3.5–5.0)
Alkaline Phosphatase: 99 U/L (ref 38–126)
Anion gap: 6 (ref 5–15)
BUN: 26 mg/dL — ABNORMAL HIGH (ref 8–23)
CO2: 28 mmol/L (ref 22–32)
Calcium: 8.9 mg/dL (ref 8.9–10.3)
Chloride: 105 mmol/L (ref 98–111)
Creatinine, Ser: 1.15 mg/dL (ref 0.61–1.24)
GFR, Estimated: >60 mL/min (ref >60)
Glucose, Bld: 148 mg/dL — ABNORMAL HIGH (ref 70–99)
Potassium: 3.1 mmol/L — ABNORMAL LOW (ref 3.5–5.1)
Sodium: 139 mmol/L (ref 135–145)
Total Bilirubin: 0.3 mg/dL (ref 0.3–1.2)
Total Protein: 7.5 g/dL (ref 6.5–8.1)

## 2021-08-29 LAB — HEMOGLOBIN A1C
Hgb A1c MFr Bld: 5.2 % (ref 4.8–5.6)
Mean Plasma Glucose: 102.54 mg/dL

## 2021-08-29 LAB — LACTIC ACID, PLASMA: Lactic Acid, Venous: 1 mmol/L (ref 0.5–1.9)

## 2021-08-29 LAB — URINALYSIS, ROUTINE W REFLEX MICROSCOPIC
Bilirubin Urine: NEGATIVE
Glucose, UA: NEGATIVE mg/dL
Hgb urine dipstick: NEGATIVE
Ketones, ur: NEGATIVE mg/dL
Leukocytes,Ua: NEGATIVE
Nitrite: NEGATIVE
Protein, ur: NEGATIVE mg/dL
Specific Gravity, Urine: 1.027 (ref 1.005–1.030)
pH: 5 (ref 5.0–8.0)

## 2021-08-29 LAB — GLUCOSE, CAPILLARY: Glucose-Capillary: 116 mg/dL — ABNORMAL HIGH (ref 70–99)

## 2021-08-29 LAB — C-REACTIVE PROTEIN: CRP: 12.4 mg/dL — ABNORMAL HIGH (ref <1.0)

## 2021-08-29 LAB — SEDIMENTATION RATE: Sed Rate: 60 mm/hr — ABNORMAL HIGH (ref 0–20)

## 2021-08-29 LAB — PREALBUMIN: Prealbumin: 9.6 mg/dL — ABNORMAL LOW (ref 18–38)

## 2021-08-29 MED ORDER — ACETAMINOPHEN 325 MG PO TABS
650.0000 mg | ORAL_TABLET | Freq: Four times a day (QID) | ORAL | Status: DC | PRN
  Administered 2021-08-29 – 2021-09-02 (×3): 650 mg via ORAL
  Filled 2021-08-29 (×3): qty 2

## 2021-08-29 MED ORDER — ONDANSETRON HCL 4 MG/2ML IJ SOLN: 4.0000 mg | Freq: Four times a day (QID) | INTRAMUSCULAR | Status: DC | PRN

## 2021-08-29 MED ORDER — POTASSIUM CHLORIDE CRYS ER 20 MEQ PO TBCR
40.0000 meq | EXTENDED_RELEASE_TABLET | Freq: Once | ORAL | Status: AC
  Administered 2021-08-29: 40 meq via ORAL
  Filled 2021-08-29: qty 2

## 2021-08-29 MED ORDER — LISDEXAMFETAMINE DIMESYLATE 20 MG PO CAPS
40.0000 mg | ORAL_CAPSULE | Freq: Every day | ORAL | Status: DC
  Administered 2021-08-29 – 2021-09-02 (×4): 40 mg via ORAL
  Filled 2021-08-29 (×5): qty 2

## 2021-08-29 MED ORDER — BUSPIRONE HCL 10 MG PO TABS: 30.0000 mg | ORAL_TABLET | Freq: Three times a day (TID) | ORAL | Status: DC | PRN

## 2021-08-29 MED ORDER — ATORVASTATIN CALCIUM 20 MG PO TABS
40.0000 mg | ORAL_TABLET | Freq: Every day | ORAL | Status: DC
  Administered 2021-08-29 – 2021-09-01 (×4): 40 mg via ORAL
  Filled 2021-08-29 (×5): qty 2

## 2021-08-29 MED ORDER — ZOLPIDEM TARTRATE 5 MG PO TABS
10.0000 mg | ORAL_TABLET | Freq: Every evening | ORAL | Status: DC | PRN
  Administered 2021-08-30 – 2021-08-31 (×2): 10 mg via ORAL
  Filled 2021-08-29 (×2): qty 2

## 2021-08-29 MED ORDER — SODIUM CHLORIDE 0.9 % IV SOLN: 250.0000 mL | INTRAVENOUS | Status: DC | PRN

## 2021-08-29 MED ORDER — ADULT MULTIVITAMIN W/MINERALS CH
1.0000 | ORAL_TABLET | Freq: Every day | ORAL | Status: DC
  Administered 2021-08-30 – 2021-09-02 (×4): 1 via ORAL
  Filled 2021-08-29 (×4): qty 1

## 2021-08-29 MED ORDER — ASCORBIC ACID 500 MG PO TABS
500.0000 mg | ORAL_TABLET | Freq: Two times a day (BID) | ORAL | Status: DC
  Administered 2021-08-30 – 2021-09-02 (×7): 500 mg via ORAL
  Filled 2021-08-29 (×7): qty 1

## 2021-08-29 MED ORDER — ALBUTEROL SULFATE (2.5 MG/3ML) 0.083% IN NEBU: 3.0000 mL | INHALATION_SOLUTION | Freq: Four times a day (QID) | RESPIRATORY_TRACT | Status: DC | PRN

## 2021-08-29 MED ORDER — ONDANSETRON HCL 4 MG PO TABS: 4.0000 mg | ORAL_TABLET | Freq: Four times a day (QID) | ORAL | Status: DC | PRN

## 2021-08-29 MED ORDER — METRONIDAZOLE 500 MG/100ML IV SOLN
500.0000 mg | Freq: Two times a day (BID) | INTRAVENOUS | Status: DC
  Administered 2021-08-29 – 2021-08-31 (×4): 500 mg via INTRAVENOUS
  Filled 2021-08-29 (×6): qty 100

## 2021-08-29 MED ORDER — SODIUM CHLORIDE 0.9 % IV SOLN
2.0000 g | Freq: Three times a day (TID) | INTRAVENOUS | Status: DC
  Administered 2021-08-29 – 2021-09-01 (×9): 2 g via INTRAVENOUS
  Filled 2021-08-29: qty 12.5
  Filled 2021-08-29: qty 2
  Filled 2021-08-29 (×3): qty 12.5
  Filled 2021-08-29: qty 2
  Filled 2021-08-29 (×4): qty 12.5

## 2021-08-29 MED ORDER — ACETAMINOPHEN 650 MG RE SUPP: 650.0000 mg | Freq: Four times a day (QID) | RECTAL | Status: DC | PRN

## 2021-08-29 MED ORDER — MORPHINE SULFATE (PF) 4 MG/ML IV SOLN
4.0000 mg | Freq: Once | INTRAVENOUS | Status: AC
  Administered 2021-08-29: 4 mg via INTRAVENOUS
  Filled 2021-08-29: qty 1

## 2021-08-29 MED ORDER — DULOXETINE HCL 30 MG PO CPEP
60.0000 mg | ORAL_CAPSULE | Freq: Every day | ORAL | Status: DC
  Administered 2021-08-29 – 2021-09-02 (×5): 60 mg via ORAL
  Filled 2021-08-29 (×3): qty 2
  Filled 2021-08-29: qty 1
  Filled 2021-08-29 (×2): qty 2

## 2021-08-29 MED ORDER — VANCOMYCIN HCL 2000 MG/400ML IV SOLN
2000.0000 mg | Freq: Once | INTRAVENOUS | Status: AC
  Administered 2021-08-29: 2000 mg via INTRAVENOUS
  Filled 2021-08-29: qty 400

## 2021-08-29 MED ORDER — BUPROPION HCL ER (XL) 150 MG PO TB24
150.0000 mg | ORAL_TABLET | Freq: Every day | ORAL | Status: DC
  Administered 2021-08-30 – 2021-09-02 (×4): 150 mg via ORAL
  Filled 2021-08-29 (×5): qty 1

## 2021-08-29 MED ORDER — VANCOMYCIN HCL 1750 MG/350ML IV SOLN
1750.0000 mg | INTRAVENOUS | Status: DC
  Filled 2021-08-29: qty 350

## 2021-08-29 MED ORDER — ENSURE MAX PROTEIN PO LIQD
11.0000 [oz_av] | Freq: Three times a day (TID) | ORAL | Status: DC
  Administered 2021-08-29 – 2021-09-02 (×13): 11 [oz_av] via ORAL
  Filled 2021-08-29: qty 330

## 2021-08-29 MED ORDER — OXYCODONE HCL 5 MG PO TABS
10.0000 mg | ORAL_TABLET | Freq: Every day | ORAL | Status: DC
  Administered 2021-08-29 – 2021-09-02 (×25): 10 mg via ORAL
  Filled 2021-08-29 (×25): qty 2

## 2021-08-29 MED ORDER — ONDANSETRON HCL 4 MG/2ML IJ SOLN
4.0000 mg | Freq: Once | INTRAMUSCULAR | Status: AC
  Administered 2021-08-29: 4 mg via INTRAVENOUS
  Filled 2021-08-29: qty 2

## 2021-08-29 MED ORDER — ENOXAPARIN SODIUM 60 MG/0.6ML IJ SOSY
0.5000 mg/kg | PREFILLED_SYRINGE | INTRAMUSCULAR | Status: DC
  Administered 2021-08-29 – 2021-09-01 (×4): 52.5 mg via SUBCUTANEOUS
  Filled 2021-08-29 (×4): qty 0.6

## 2021-08-29 MED ORDER — SODIUM CHLORIDE 0.9 % IV SOLN: 2.0000 g | Freq: Three times a day (TID) | INTRAVENOUS | Status: DC

## 2021-08-29 MED ORDER — ZINC SULFATE 220 (50 ZN) MG PO CAPS
220.0000 mg | ORAL_CAPSULE | Freq: Every day | ORAL | Status: DC
  Administered 2021-08-30 – 2021-09-02 (×4): 220 mg via ORAL
  Filled 2021-08-29 (×4): qty 1

## 2021-08-29 MED ORDER — SODIUM CHLORIDE 0.9% FLUSH
3.0000 mL | Freq: Two times a day (BID) | INTRAVENOUS | Status: DC
  Administered 2021-08-29 – 2021-09-02 (×8): 3 mL via INTRAVENOUS

## 2021-08-29 MED ORDER — LEVOTHYROXINE SODIUM 50 MCG PO TABS
75.0000 ug | ORAL_TABLET | Freq: Every day | ORAL | Status: DC
  Administered 2021-08-29 – 2021-09-02 (×4): 75 ug via ORAL
  Filled 2021-08-29: qty 2
  Filled 2021-08-29 (×3): qty 1

## 2021-08-29 MED ORDER — TAMSULOSIN HCL 0.4 MG PO CAPS
0.4000 mg | ORAL_CAPSULE | Freq: Every day | ORAL | Status: DC
  Administered 2021-08-29 – 2021-09-01 (×4): 0.4 mg via ORAL
  Filled 2021-08-29 (×4): qty 1

## 2021-08-29 MED ORDER — SODIUM CHLORIDE 0.9% FLUSH
3.0000 mL | INTRAVENOUS | Status: DC | PRN
Start: 2021-08-29 — End: 2021-09-02

## 2021-08-29 NOTE — Assessment & Plan Note (Addendum)
Continue Norvasc 5 mg daily.  Added back low-dose Coreg. ?

## 2021-08-29 NOTE — ED Provider Notes (Addendum)
? ?[facility] ?Provider Note ? ? ? Event Date/Time  ? First MD Initiated Contact with Patient 08/29/21 [id number]   ?  (approximate) ? ? ?History  ? ?Wound Infection ? ? ?HPI ? ?Charles [name] is a 61 y.o. male with a history of diabetes, who presents with swelling, redness and pain to his left lower extremity.  Patient sees Dr. Luana Shu of podiatry as well as wound care.  Has apparently been diagnosed with osteomyelitis to the left plantar fifth metatarsal phalangeal joint and surgery has been recommended.  Patient reports he has had swelling to the area over the last several days which is worsening, he has been working.  Now he reports redness and pain extending up the leg.  Does not believe he has had fevers ?  ? ? ?Physical Exam  ? ?Triage Vital Signs: ?ED Triage Vitals  ?Enc Vitals Group  ?   BP 08/29/21 0712 140/71  ?   Pulse Rate 08/29/21 0712 79  ?   Resp 08/29/21 0712 18  ?   Temp 08/29/21 0712 98.2 ?F (36.8 ?C)  ?   Temp Source 08/29/21 0712 Oral  ?   SpO2 08/29/21 0712 98 %  ?   Weight 08/29/21 0714 106.6 kg (235 lb)  ?   Height 08/29/21 0714 1.854 m ('6\' 1"'$ )  ?   Head Circumference --   ?   Peak Flow --   ?   Pain Score 08/29/21 0714 9  ?   Pain Loc --   ?   Pain Edu? --   ?   Excl. in [place]? --   ? ? ?Most recent vital signs: ?Vitals:  ? 08/29/21 0712 08/29/21 0730  ?BP: 140/71 114/61  ?Pulse: 79 67  ?Resp: 18 16  ?Temp: 98.2 ?F (36.8 ?C)   ?SpO2: 98% 100%  ? ? ? ?General: Awake, no distress.  ?CV:  Good peripheral perfusion.  ?Resp:  Normal effort.  ?Abd:  No distention.  ?Other:  Ulceration to the left lateral fifth metatarsal phalangeal joint, followed or swelling of discharge, erythema extending up the leg inferior to the knee with edema ? ? ?ED Results / Procedures / Treatments  ? ?Labs ?(all labs ordered are listed, but only abnormal results are displayed) ?Labs Reviewed  ?COMPREHENSIVE METABOLIC PANEL - Abnormal; Notable for the following components:  ?    Result Value  ? Potassium  3.1 (*)   ? Glucose, Bld 148 (*)   ? BUN 26 (*)   ? Albumin 3.2 (*)   ? AST 45 (*)   ? All other components within normal limits  ?CBC WITH DIFFERENTIAL/PLATELET - Abnormal; Notable for the following components:  ? RBC 3.94 (*)   ? Hemoglobin 10.9 (*)   ? HCT 35.1 (*)   ? All other components within normal limits  ?CULTURE, BLOOD (ROUTINE X 2)  ?CULTURE, BLOOD (ROUTINE X 2)  ?LACTIC ACID, PLASMA  ? ? ? ?EKG ? ? ? ? ?RADIOLOGY ?X-ray foot interpreted by me, consistent with osteomyelitis ? ? ? ?PROCEDURES: ? ?Critical Care performed: yes ? ?CRITICAL CARE ?Performed by: Lavonia Drafts ? ? ?Total critical care time: 30 minutes ? ?Critical care time was exclusive of separately billable procedures and treating other patients. ? ?Critical care was necessary to treat or prevent imminent or life-threatening deterioration. ? ?Critical care was time spent personally by me on the following activities: development of treatment plan with patient and/or surrogate as well as nursing, discussions with  consultants, evaluation of patient's response to treatment, examination of patient, obtaining history from patient or surrogate, ordering and performing treatments and interventions, ordering and review of laboratory studies, ordering and review of radiographic studies, pulse oximetry and re-evaluation of patient's condition. ? ? ?Procedures ? ? ?MEDICATIONS ORDERED IN ED: ?Medications  ?metroNIDAZOLE (FLAGYL) IVPB 500 mg (500 mg Intravenous New Bag/Given 08/29/21 0748)  ?morphine (PF) 4 MG/ML injection 4 mg (4 mg Intravenous Given 08/29/21 0749)  ?ondansetron [facility]) injection 4 mg (4 mg Intravenous Given 08/29/21 0748)  ? ? ? ?IMPRESSION / MDM / ASSESSMENT AND PLAN / ED COURSE  ?I reviewed the triage vital signs and the nursing notes. ? ?Patient with a history of diabetes presents with an illness that poses a threat to bodily function and potentially life ? ?Differential includes osteomyelitis/wound infection, cellulitis, sepsis ? ?I  suspect that area of osteomyelitis/wound infection has now spread causing cellulitis to the lower extremity.  Patient has had a culture by podiatry that has demonstrated MRSA and Pseudomonas ? ?We will obtain labs, give IV morphine, IV Zofran, provide broad-spectrum antibiotics with coverage for MRSA and pseudomonal ? ?I have consulted with Dr. Cleda Mccreedy of podiatry, he will see the patient in the emergency department. ? ?Lab work reviewed lactic acid is normal, white blood cell count is normal, CMP unremarkable ? ?Patient will require admission to the hospital, we will consult the hospitalist service for admission ? ?Dr. Francine Graven will admit the patient ? ? ? ?  ? ? ?FINAL CLINICAL IMPRESSION(S) / ED DIAGNOSES  ? ?Final diagnoses:  ?Cellulitis of left lower extremity  ?Other acute osteomyelitis, unspecified site [facility])  ? ? ? ?Rx / DC Orders  ? ?ED Discharge Orders   ? ? None  ? ?  ? ? ? ?Note:  This document was prepared using Dragon voice recognition software and may include unintentional dictation errors. ?  ?Lavonia Drafts, MD ?08/29/21 [phone] ? ?  ?Lavonia Drafts, MD ?08/29/21 0840 ? ?
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
## 2021-08-29 NOTE — Assessment & Plan Note (Addendum)
Continue atorvastatin, aspirin and continue low-dose Coreg. ?

## 2021-08-29 NOTE — Consult Note (Signed)
Pharmacy Antibiotic Note ? ?Charles [name] is a 61 y.o. male admitted on 08/29/2021 with  wound infection .  Pharmacy has been consulted for Cefepime and Vancomycin dosing. ? ?Plan: ?Cefepime 2gm IVPB q 8hrs ?Vancomycin '2000mg'$  x 1 dose, then  ?Vancomycin 1750 mg IV Q 24 hrs. ?Goal AUC 400-550. ?Expected AUC: 485 ?SCr used: 1.15 ? ?Height: '6\' 1"'$  (185.4 cm) ?Weight: 106.6 kg (235 lb) ?IBW/kg (Calculated) : 79.9 ? ?Temp (24hrs), Avg:98.2 ?F (36.8 ?C), Min:98.2 ?F (36.8 ?C), Max:98.2 ?F (36.8 ?C) ? ?Recent Labs  ?Lab 08/29/21 ?0723 08/29/21 ?1937  ?WBC 6.3  --   ?CREATININE 1.15  --   ?LATICACIDVEN  --  1.0  ?  ?Estimated Creatinine Clearance: 86.4 mL/min (by C-G formula based on SCr of 1.15 mg/dL).   ? ?Allergies  ?Allergen Reactions  ? Lisinopril Cough  ? ? ?Antimicrobials this admission: ?5/12 Metronidazole x 1 ?5/12 Cefepime >>  ?5/12 Vancomycin >>  ? ?Microbiology results: ?5/12 BCx: pending collection ?5/12 MRSA PCR: ordered ? ?Thank you for allowing pharmacy to be a part of this patient?s care. ? ?Dominie Benedick Rodriguez-Guzman PharmD, BCPS ?08/29/2021 10:06 AM ? ?

## 2021-08-29 NOTE — Assessment & Plan Note (Addendum)
Osteomyelitis of the distal left foot metatarsal and base of the proximal phalanx.  Patient went to the operating room by Dr. Cleda Mccreedy 08/31/2021.  Patient had amputation of left fifth toe with partial ray resection.  Continue vancomycin cefepime and Flagyl.  Case discussed with infectious disease and likely she was changed antibiotics over to Unasyn with rare Enterococcus growing out of the culture.  Pathology shows that osteomyelitis involves the bone margin.  Infectious disease decided with the patient to do oral antibiotics amoxicillin 1 g 3 times daily for 4 weeks ?

## 2021-08-29 NOTE — ED Notes (Signed)
Hospitalist at bedside 
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
## 2021-08-29 NOTE — Anesthesia Preprocedure Evaluation (Addendum)
Anesthesia Evaluation  ?Patient identified by MRN, date of birth, ID band ?Patient awake ? ? ? ?Reviewed: ?Allergy & Precautions, NPO status , Patient's Chart, lab work & pertinent test results, reviewed documented beta blocker date and time  ? ?Airway ?Mallampati: II ? ?TM Distance: >3 FB ?Neck ROM: Full ? ? ? Dental ? ?(+) Poor Dentition, Missing ?  ?Pulmonary ?asthma , sleep apnea ,  ?  ?Pulmonary exam normal ?breath sounds clear to auscultation ? ? ? ? ? ? Cardiovascular ?Exercise Tolerance: Good ?hypertension, Pt. on home beta blockers ?(-) angina+ CAD (s/p MI and stents), + Past MI (2017) and + Cardiac Stents (2017)  ?Normal cardiovascular exam ? ?ECG 01/31/21: Sinus bradycardia (HR 59), otherwise normal ? ?Echo 11/12/20:  ?NORMAL LEFT VENTRICULAR SYSTOLIC FUNCTION  ?NORMAL RIGHT VENTRICULAR SYSTOLIC FUNCTION  ?NO VALVULAR STENOSIS  ?TRIVIAL MR, TR  ?EF 50-55% ?  ?Neuro/Psych ?PSYCHIATRIC DISORDERS (ADHD) Anxiety Depression Chronic pain s/p intrathecal pain pump ? ? ? Neuromuscular disease (Neuropathy both feet)   ? GI/Hepatic ?PUD, GERD  ,S/p gastric bypass 2011 ?  ?Endo/Other  ?diabetes, Type 2Hypothyroidism  ? Renal/GU ?Renal disease (nephrolithiasis)  ? ?  ?Musculoskeletal ? ?(+) Arthritis , Gout   ? Abdominal ?(+) + obese,   ?Peds ? Hematology ? ?(+) Blood dyscrasia, anemia ,   ?Anesthesia Other Findings ?Reviewed 11/19/20 cardiology note. ? ?ECHO 11/20: [facility] ? Reproductive/Obstetrics ? ?  ? ? ? ? ? ? ? ? ? ? ? ? ? ?  ?  ? ? ? ? ? ? ? ? ?Anesthesia Physical ? ?Anesthesia Plan ? ?ASA: 3 ? ?Anesthesia Plan: General  ? ?Post-op Pain Management: Tylenol PO (pre-op)* and Regional block*  ? ?Induction: Intravenous ? ?PONV Risk Score and Plan: 2 and Ondansetron and Treatment may vary due to age or medical condition ? ?Airway Management Planned: Natural Airway and Simple Face Mask ? ?Additional Equipment:  ? ?Intra-op Plan:  ? ?Post-operative Plan:  ? ?Informed Consent: I have  reviewed the patients History and Physical, chart, labs and discussed the procedure including the risks, benefits and alternatives for the proposed anesthesia with the patient or authorized representative who has indicated his/her understanding and acceptance.  ? ? ? ?Dental advisory given ? ?Plan Discussed with: CRNA ? ?Anesthesia Plan Comments: (Patient consented for risks of anesthesia including but not limited to:  ?- adverse reactions to medications ?- risk of airway placement if required ?- damage to eyes, teeth, lips or other oral mucosa ?- nerve damage due to positioning  ?- sore throat or hoarseness ?- Damage to heart, brain, nerves, lungs, other parts of body or loss of life ? ?Patient voiced understanding.)  ? ? ? ? ?Anesthesia Quick Evaluation ? ?

## 2021-08-29 NOTE — ED Notes (Signed)
Informed RN bed assigned again 

## 2021-08-29 NOTE — Assessment & Plan Note (Addendum)
Patient is not a diabetic.  Hemoglobin A1c 5.2. ?

## 2021-08-29 NOTE — Assessment & Plan Note (Addendum)
Continue Synthroid °

## 2021-08-29 NOTE — Assessment & Plan Note (Addendum)
Replaced. °

## 2021-08-29 NOTE — ED Triage Notes (Signed)
Pt sent here for eval of his left leg wound. Pt went to the wound clinic on yesterday and was told that he needed to come to the ED for a CT scan due to his insurance not covering it outpt. Pt believed to have osteomyelitis. Leg is red and swollen. ?

## 2021-08-29 NOTE — ED Notes (Signed)
Informed RN bed assigned 

## 2021-08-29 NOTE — ED Notes (Signed)
Pt to ED for worsening LLE edema, redness, pain that extends from foot to knee. Pt has chronic diabetic sounds on both feet with gauze wrapping to L foot. Pt  is missing all toes on R foot and great toe to L foot. Provider saw pt at bedside. ?

## 2021-08-29 NOTE — ED Notes (Signed)
Looking fore IV pump to give vanc. ?
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
## 2021-08-29 NOTE — Assessment & Plan Note (Addendum)
Continue bupropion, buspirone and Cymbalta ?

## 2021-08-29 NOTE — Progress Notes (Signed)
Initial Nutrition Assessment ? ?DOCUMENTATION CODES:  ? ?Obesity unspecified ? ?INTERVENTION:  ? ?Ensure Max protein supplement TID, each supplement provides 150kcal and 30g of protein. ? ?MVI po daily  ? ?Vitamin C $RemoveBe'500mg'[id number]$  po BID ? ?Zinc $Remov'220mg'MCSUrI$  po daily x 14 days ? ?Liberalize diet  ? ?Pt at high risk for nutrient deficiencies r/t his gastric surgery. Will check thiamine, B12, folate, iron, TIBC, transferrin, zinc, copper and vitamins D, C, A & E labs.  ? ?Pt at high refeed risk; recommend monitor potassium, magnesium and phosphorus labs daily until stable ? ?NUTRITION DIAGNOSIS:  ? ?Increased nutrient needs related to wound healing as evidenced by estimated needs. ? ?GOAL:  ? ?Patient will meet greater than or equal to 90% of their needs ? ?MONITOR:  ? ?PO intake, Supplement acceptance, Labs, Weight trends, Skin, I & O's ? ?REASON FOR ASSESSMENT:  ? ?Consult ?Assessment of nutrition requirement/status ? ?ASSESSMENT:  ? ?61 y.o. male with medical history significant for coronary artery disease status post stent angioplasty, hypertension, kideny stones, hypothyroidism, anxiety, MDD, neuropathy, PAD status post right TMA, DM, ADHD, GERD, gout, MI, Roux-en-y gastric bypass (8469) complicated by perforated ulcer at G-J anastamosis s/p omental patch (08/2012) and inguinal hernia repair s/p mesh repair (06/2020) who is admitted with L leg osteomyelitis. ? ?Met with pt in room today. Pt reports fair appetite and oral intake at baseline. Pt with a h/o roux-en-y gastric bypass in 2011. Pt reports that he weighed ~430lbs at the time of his gastric bypass and that he lost down to ~230lbs and now he stays around that weight. Pt reports that he used to take his vitamin labs religiously but reports that over the past year he has quit taken them r/t "back to back" illnesses and hospitalizations. RD discussed with pt the importance of adequate nutrition needed to support wound healing. RD will add supplements and vitamins to support  wound healing. RD will also liberalize the heart healthy portion of pt's diet as this is restrictive of protein. RD will send out bariatric vitamin labs to make sure levels are adequate to support wound healing.   ? ?Medications reviewed and include: lovenox, synthroid, oxycodone, cefepime, metronidazole, vancomycin ? ?Labs reviewed: K 3.1(L), BUN 26(H), Mg 2.3 wnl ?Hgb 10.9(L), Hct 35.1(L) ?AIC- 5.7(H)- 1/24 ? ?NUTRITION - FOCUSED PHYSICAL EXAM: ? ?Flowsheet Row Most Recent Value  ?Orbital Region No depletion  ?Upper Arm Region No depletion  ?Thoracic and Lumbar Region No depletion  ?Buccal Region No depletion  ?Temple Region No depletion  ?Clavicle Bone Region Mild depletion  ?Clavicle and Acromion Bone Region Mild depletion  ?Scapular Bone Region No depletion  ?Dorsal Hand No depletion  ?Patellar Region No depletion  ?Anterior Thigh Region No depletion  ?Posterior Calf Region No depletion  ?Edema (RD Assessment) None  ?Hair Reviewed  ?Eyes Reviewed  ?Mouth Reviewed  ?Skin Reviewed  ?Nails Reviewed  ? ?Diet Order:   ?Diet Order   ? ?       ?  Diet NPO time specified  Diet effective midnight       ?  ?  Diet Carb Modified Fluid consistency: Thin; Room service appropriate? Yes  Diet effective now       ?  ? ?  ?  ? ?  ? ?EDUCATION NEEDS:  ? ?Education needs have been addressed ? ?Skin:  Skin Assessment: Reviewed RN Assessment ? ?Last BM:  pta ? ?Height:  ? ?Ht Readings from Last 1 Encounters:  ?08/29/21 $RemoveBe'6\' 1"'[id number]$  (  1.854 m)  ? ? ?Weight:  ? ?Wt Readings from Last 1 Encounters:  ?08/29/21 106.6 kg  ? ? ?Ideal Body Weight:  83.6 kg ? ?BMI:  Body mass index is 31 kg/m?. ? ?Estimated Nutritional Needs:  ? ?Kcal:  2400-2700kcal/day ? ?Protein:  >120g/day ? ?Fluid:  2.5-2.8L/day ? ?Koleen Distance MS, RD, LDN ?Please refer to [facility] for RD and/or RD on-call/weekend/after hours pager ? ?

## 2021-08-30 ENCOUNTER — Inpatient Hospital Stay: Payer: Managed Care, Other (non HMO) | Admitting: Anesthesiology

## 2021-08-30 ENCOUNTER — Encounter
Admission: EM | Disposition: A | Discharge status: Home / Self Care | Payer: Self-pay | Source: Home / Self Care | Attending: Internal Medicine

## 2021-08-30 ENCOUNTER — Other Ambulatory Visit: Payer: Self-pay

## 2021-08-30 DIAGNOSIS — E669 Obesity, unspecified: Secondary | ICD-10-CM

## 2021-08-30 DIAGNOSIS — G8929 Other chronic pain: Secondary | ICD-10-CM

## 2021-08-30 DIAGNOSIS — R7301 Impaired fasting glucose: Secondary | ICD-10-CM

## 2021-08-30 DIAGNOSIS — F329 Major depressive disorder, single episode, unspecified: Secondary | ICD-10-CM | POA: Diagnosis not present

## 2021-08-30 DIAGNOSIS — M86472 Chronic osteomyelitis with draining sinus, left ankle and foot: Secondary | ICD-10-CM | POA: Diagnosis not present

## 2021-08-30 DIAGNOSIS — E039 Hypothyroidism, unspecified: Secondary | ICD-10-CM

## 2021-08-30 HISTORY — PX: AMPUTATION: SHX166

## 2021-08-30 LAB — CBC
HCT: 32.7 % — ABNORMAL LOW (ref 39.0–52.0)
Hemoglobin: 10.3 g/dL — ABNORMAL LOW (ref 13.0–17.0)
MCH: 28.4 pg (ref 26.0–34.0)
MCHC: 31.5 g/dL (ref 30.0–36.0)
MCV: 90.1 fL (ref 80.0–100.0)
Platelets: 273 10*3/uL (ref 150–400)
RBC: 3.63 MIL/uL — ABNORMAL LOW (ref 4.22–5.81)
RDW: 13.6 % (ref 11.5–15.5)
WBC: 4.5 10*3/uL (ref 4.0–10.5)
nRBC: 0 % (ref 0.0–0.2)

## 2021-08-30 LAB — BASIC METABOLIC PANEL
Anion gap: 3 — ABNORMAL LOW (ref 5–15)
BUN: 23 mg/dL (ref 8–23)
CO2: 27 mmol/L (ref 22–32)
Calcium: 8.6 mg/dL — ABNORMAL LOW (ref 8.9–10.3)
Chloride: 112 mmol/L — ABNORMAL HIGH (ref 98–111)
Creatinine, Ser: 0.92 mg/dL (ref 0.61–1.24)
GFR, Estimated: >60 mL/min (ref >60)
Glucose, Bld: 112 mg/dL — ABNORMAL HIGH (ref 70–99)
Potassium: 4.2 mmol/L (ref 3.5–5.1)
Sodium: 142 mmol/L (ref 135–145)

## 2021-08-30 LAB — IRON AND TIBC
Iron: 18 ug/dL — ABNORMAL LOW (ref 45–182)
Saturation Ratios: 6 % — ABNORMAL LOW (ref 17.9–39.5)
TIBC: 304 ug/dL (ref 250–450)
UIBC: 286 ug/dL

## 2021-08-30 LAB — VITAMIN B12: Vitamin B-12: 491 pg/mL (ref 180–914)

## 2021-08-30 LAB — VITAMIN D 25 HYDROXY (VIT D DEFICIENCY, FRACTURES): Vit D, 25-Hydroxy: 31.98 ng/mL (ref 30–100)

## 2021-08-30 LAB — GLUCOSE, CAPILLARY: Glucose-Capillary: 185 mg/dL — ABNORMAL HIGH (ref 70–99)

## 2021-08-30 LAB — FOLATE: Folate: 18.2 ng/mL (ref >5.9)

## 2021-08-30 LAB — TRANSFERRIN: Transferrin: 222 mg/dL (ref 180–329)

## 2021-08-30 LAB — FERRITIN: Ferritin: 76 ng/mL (ref 24–336)

## 2021-08-30 SURGERY — AMPUTATION, FOOT, RAY
Anesthesia: General | Site: Foot | Laterality: Left

## 2021-08-30 MED ORDER — DROPERIDOL 2.5 MG/ML IJ SOLN: 0.6250 mg | Freq: Once | INTRAMUSCULAR | Status: DC | PRN

## 2021-08-30 MED ORDER — ASPIRIN EC 81 MG PO TBEC
81.0000 mg | DELAYED_RELEASE_TABLET | Freq: Every day | ORAL | Status: DC
  Administered 2021-08-31 – 2021-09-02 (×3): 81 mg via ORAL
  Filled 2021-08-30 (×3): qty 1

## 2021-08-30 MED ORDER — FENTANYL CITRATE (PF) 100 MCG/2ML IJ SOLN
25.0000 ug | INTRAMUSCULAR | Status: DC | PRN
  Administered 2021-08-30: 50 ug via INTRAVENOUS

## 2021-08-30 MED ORDER — LIDOCAINE HCL (CARDIAC) PF 100 MG/5ML IV SOSY
PREFILLED_SYRINGE | INTRAVENOUS | Status: DC | PRN
  Administered 2021-08-30: 60 mg via INTRAVENOUS

## 2021-08-30 MED ORDER — ACETAMINOPHEN 500 MG PO TABS
1000.0000 mg | ORAL_TABLET | Freq: Once | ORAL | Status: AC
Start: 2021-08-30 — End: 2021-08-30

## 2021-08-30 MED ORDER — PROMETHAZINE HCL 25 MG/ML IJ SOLN: 6.2500 mg | INTRAMUSCULAR | Status: DC | PRN

## 2021-08-30 MED ORDER — MIDAZOLAM HCL 2 MG/2ML IJ SOLN
INTRAMUSCULAR | Status: DC | PRN
Start: 2021-08-30 — End: 2021-08-30
  Administered 2021-08-30: 2 mg via INTRAVENOUS

## 2021-08-30 MED ORDER — ONDANSETRON HCL 4 MG/2ML IJ SOLN
INTRAMUSCULAR | Status: AC
  Filled 2021-08-30: qty 2

## 2021-08-30 MED ORDER — NEOMYCIN-POLYMYXIN B GU 40-200000 IR SOLN
Status: DC | PRN
  Administered 2021-08-30: 4 mL

## 2021-08-30 MED ORDER — MIDAZOLAM HCL 2 MG/2ML IJ SOLN
INTRAMUSCULAR | Status: AC
Start: 2021-08-30 — End: ?
  Filled 2021-08-30: qty 2

## 2021-08-30 MED ORDER — ACETAMINOPHEN 10 MG/ML IV SOLN: 1000.0000 mg | Freq: Once | INTRAVENOUS | Status: DC | PRN

## 2021-08-30 MED ORDER — PROPOFOL 500 MG/50ML IV EMUL
INTRAVENOUS | Status: AC
  Filled 2021-08-30: qty 50

## 2021-08-30 MED ORDER — OXYCODONE HCL 5 MG PO TABS: 10.0000 mg | ORAL_TABLET | Freq: Once | ORAL | Status: DC | PRN

## 2021-08-30 MED ORDER — PROPOFOL 10 MG/ML IV BOLUS
INTRAVENOUS | Status: AC
  Filled 2021-08-30: qty 20

## 2021-08-30 MED ORDER — PROPOFOL 500 MG/50ML IV EMUL
INTRAVENOUS | Status: DC | PRN
  Administered 2021-08-30: 150 ug/kg/min via INTRAVENOUS

## 2021-08-30 MED ORDER — BUPIVACAINE HCL 0.5 % IJ SOLN
INTRAMUSCULAR | Status: DC | PRN
Start: 2021-08-30 — End: 2021-08-30
  Administered 2021-08-30: 10 mL

## 2021-08-30 MED ORDER — ACETAMINOPHEN 500 MG PO TABS
ORAL_TABLET | ORAL | Status: AC
  Administered 2021-08-30: 1000 mg via ORAL
  Filled 2021-08-30: qty 2

## 2021-08-30 MED ORDER — MORPHINE SULFATE (PF) 2 MG/ML IV SOLN
2.0000 mg | INTRAVENOUS | Status: DC | PRN
  Administered 2021-08-30: 2 mg via INTRAVENOUS
  Filled 2021-08-30: qty 1

## 2021-08-30 MED ORDER — FENTANYL CITRATE (PF) 100 MCG/2ML IJ SOLN
INTRAMUSCULAR | Status: DC | PRN
Start: 2021-08-30 — End: 2021-08-30
  Administered 2021-08-30 (×2): 50 ug via INTRAVENOUS

## 2021-08-30 MED ORDER — 0.9 % SODIUM CHLORIDE (POUR BTL) OPTIME
TOPICAL | Status: DC | PRN
  Administered 2021-08-30: 1000 mL

## 2021-08-30 MED ORDER — FENTANYL CITRATE (PF) 100 MCG/2ML IJ SOLN
INTRAMUSCULAR | Status: AC
Start: 2021-08-30 — End: ?
  Filled 2021-08-30: qty 2

## 2021-08-30 MED ORDER — OXYCODONE HCL 5 MG PO TABS: 10.0000 mg | ORAL_TABLET | Freq: Once | ORAL | Status: AC

## 2021-08-30 MED ORDER — OXYCODONE HCL 5 MG/5ML PO SOLN: 5.0000 mg | Freq: Once | ORAL | Status: DC | PRN

## 2021-08-30 MED ORDER — OXYCODONE HCL 5 MG PO TABS
ORAL_TABLET | ORAL | Status: AC
  Administered 2021-08-30: 10 mg via ORAL
  Filled 2021-08-30: qty 2

## 2021-08-30 MED ORDER — FENTANYL CITRATE (PF) 100 MCG/2ML IJ SOLN
INTRAMUSCULAR | Status: AC
  Administered 2021-08-30: 50 ug via INTRAVENOUS
  Filled 2021-08-30: qty 2

## 2021-08-30 MED ORDER — LIDOCAINE HCL (PF) 2 % IJ SOLN
INTRAMUSCULAR | Status: AC
  Filled 2021-08-30: qty 5

## 2021-08-30 MED ORDER — ONDANSETRON HCL 4 MG/2ML IJ SOLN
INTRAMUSCULAR | Status: DC | PRN
  Administered 2021-08-30: 4 mg via INTRAVENOUS

## 2021-08-30 MED ORDER — VANCOMYCIN HCL 1500 MG/300ML IV SOLN
1500.0000 mg | Freq: Two times a day (BID) | INTRAVENOUS | Status: DC
  Administered 2021-08-30 – 2021-09-01 (×5): 1500 mg via INTRAVENOUS
  Filled 2021-08-30 (×5): qty 300

## 2021-08-30 SURGICAL SUPPLY — 48 items
BLADE MED AGGRESSIVE (BLADE) ×2 IMPLANT
BLADE OSC/SAGITTAL MD 5.5X18 (BLADE) ×2 IMPLANT
BLADE SURG 15 STRL LF DISP TIS (BLADE) ×2 IMPLANT
BLADE SURG 15 STRL SS (BLADE) ×4
BLADE SURG MINI STRL (BLADE) ×2 IMPLANT
BNDG CONFORM 2 STRL LF (GAUZE/BANDAGES/DRESSINGS) ×2 IMPLANT
BNDG CONFORM 3 STRL LF (GAUZE/BANDAGES/DRESSINGS) ×2 IMPLANT
BNDG ELASTIC 4X5.8 VLCR STR LF (GAUZE/BANDAGES/DRESSINGS) ×2 IMPLANT
BNDG ESMARK 4X12 TAN STRL LF (GAUZE/BANDAGES/DRESSINGS) ×2 IMPLANT
BNDG GAUZE ELAST 4 BULKY (GAUZE/BANDAGES/DRESSINGS) ×2 IMPLANT
CUFF TOURN SGL QUICK 12 (TOURNIQUET CUFF) ×2 IMPLANT
CUFF TOURN SGL QUICK 18X4 (TOURNIQUET CUFF) ×2 IMPLANT
DRAPE FLUOR MINI C-ARM 54X84 (DRAPES) ×2 IMPLANT
DURAPREP 26ML APPLICATOR (WOUND CARE) ×2 IMPLANT
ELECT REM PT RETURN 9FT ADLT (ELECTROSURGICAL) ×2
ELECTRODE REM PT RTRN 9FT ADLT (ELECTROSURGICAL) ×1 IMPLANT
GAUZE SPONGE 4X4 12PLY STRL (GAUZE/BANDAGES/DRESSINGS) ×2 IMPLANT
GAUZE XEROFORM 1X8 LF (GAUZE/BANDAGES/DRESSINGS) ×2 IMPLANT
GLOVE BIO SURGEON STRL SZ7.5 (GLOVE) ×2 IMPLANT
GLOVE SURG UNDER LTX SZ8 (GLOVE) ×2 IMPLANT
GOWN STRL REUS W/ TWL LRG LVL3 (GOWN DISPOSABLE) ×2 IMPLANT
GOWN STRL REUS W/TWL LRG LVL3 (GOWN DISPOSABLE) ×4
HANDPIECE VERSAJET DEBRIDEMENT (MISCELLANEOUS) ×2 IMPLANT
IV NS IRRIG 3000ML ARTHROMATIC (IV SOLUTION) ×2 IMPLANT
KIT TURNOVER KIT A (KITS) ×2 IMPLANT
LABEL OR SOLS (LABEL) ×2 IMPLANT
MANIFOLD NEPTUNE II (INSTRUMENTS) ×2 IMPLANT
NDL FILTER BLUNT 18X1 1/2 (NEEDLE) ×1 IMPLANT
NDL HYPO 25X1 1.5 SAFETY (NEEDLE) ×2 IMPLANT
NEEDLE FILTER BLUNT 18X 1/2SAF (NEEDLE) ×1
NEEDLE FILTER BLUNT 18X1 1/2 (NEEDLE) ×1 IMPLANT
NEEDLE HYPO 25X1 1.5 SAFETY (NEEDLE) ×4 IMPLANT
NS IRRIG 500ML POUR BTL (IV SOLUTION) ×2 IMPLANT
PACK EXTREMITY ARMC (MISCELLANEOUS) ×2 IMPLANT
PAD ABD DERMACEA PRESS 5X9 (GAUZE/BANDAGES/DRESSINGS) ×4 IMPLANT
SOL PREP PVP 2OZ (MISCELLANEOUS) ×2
SOLUTION PREP PVP 2OZ (MISCELLANEOUS) ×1 IMPLANT
STOCKINETTE BIAS CUT 4 980044 (GAUZE/BANDAGES/DRESSINGS) ×2 IMPLANT
STOCKINETTE STRL 6IN 960660 (GAUZE/BANDAGES/DRESSINGS) ×2 IMPLANT
STRIP CLOSURE SKIN 1/4X4 (GAUZE/BANDAGES/DRESSINGS) ×2 IMPLANT
SUT ETHILON 3-0 FS-10 30 BLK (SUTURE) ×2
SUT ETHILON 4-0 (SUTURE)
SUT ETHILON 4-0 FS2 18XMFL BLK (SUTURE)
SUTURE EHLN 3-0 FS-10 30 BLK (SUTURE) ×1 IMPLANT
SUTURE ETHLN 4-0 FS2 18XMF BLK (SUTURE) IMPLANT
SWAB CULTURE AMIES ANAERIB BLU (MISCELLANEOUS) ×2 IMPLANT
SYR 10ML LL (SYRINGE) ×2 IMPLANT
WATER STERILE IRR 500ML POUR (IV SOLUTION) ×2 IMPLANT

## 2021-08-30 NOTE — Plan of Care (Signed)

## 2021-08-30 NOTE — Anesthesia Procedure Notes (Signed)
Procedure Name: [place] ?Date/Time: 08/30/2021 7:45 AM ?Performed by: Jerrye Noble, CRNA ?Pre-anesthesia Checklist: Patient identified, Emergency Drugs available, Suction available and Patient being monitored ?Patient Re-evaluated:Patient Re-evaluated prior to induction ?Oxygen Delivery Method: Simple face mask ? ? ? ? ?

## 2021-08-30 NOTE — Consult Note (Signed)
Pharmacy Antibiotic Note ? ?Charles [name] is a 61 y.o. male admitted on 08/29/2021 with  wound infection / Osteomyelitis.  Pharmacy has been consulted for Cefepime and Vancomycin dosing. ? ?5/13- Amputation left fifth toe with partial ray resection. ? ?Plan: Scr improved 1.15 > 0.92 ?Continue Cefepime 2gm IVPB q 8hrs ?Adjust Vancomycin from 1750 mg IV Q 24 hrs to '1500mg'$  q12h. ?Goal AUC 400-550. ?Expected AUC: 468 ?SCr used: 0.92 ?Cmin 12.9 ?Vd=0.72  (BMI just at 30) ? ?Height: '6\' 1"'$  (185.4 cm) ?Weight: 106.6 kg (235 lb) ?IBW/kg (Calculated) : 79.9 ? ?Temp (24hrs), Avg:97.7 ?F (36.5 ?C), Min:96.8 ?F (36 ?C), Max:98.2 ?F (36.8 ?C) ? ?Recent Labs  ?Lab 08/29/21 ?0723 08/29/21 ?0724 08/30/21 ?0421  ?WBC 6.3  --  4.5  ?CREATININE 1.15  --  0.92  ?LATICACIDVEN  --  1.0  --   ? ?  ?Estimated Creatinine Clearance: 108.1 mL/min (by C-G formula based on SCr of 0.92 mg/dL).   ? ?Allergies  ?Allergen Reactions  ? Lisinopril Cough  ? ? ?Antimicrobials this admission: ?5/12 Metronidazole x 1 ?5/12 Cefepime >>  ?5/12 Vancomycin >>  ? ?Microbiology results: ?5/12 BCx: pending collection ?5/12 MRSA PCR: ordered ? ?Thank you for allowing pharmacy to be a part of this patient?s care. ? ?Chinita Greenland PharmD ?Clinical Pharmacist ?08/30/2021 ? ? ?

## 2021-08-30 NOTE — Anesthesia Postprocedure Evaluation (Signed)
Anesthesia Post Note ? ?Patient: TRAN ARZUAGA ? ?Procedure(s) Performed: LEFT 5TH RAY RESECTION (Left: Foot) ? ?Patient location during evaluation: PACU ?Anesthesia Type: General ?Level of consciousness: awake and alert ?Pain management: pain level controlled ?Vital Signs Assessment: post-procedure vital signs reviewed and stable ?Respiratory status: spontaneous breathing, nonlabored ventilation and respiratory function stable ?Cardiovascular status: blood pressure returned to baseline and stable ?Postop Assessment: no apparent nausea or vomiting ?Anesthetic complications: no ? ? ?No notable events documented. ? ? ?Last Vitals:  ?Vitals:  ? 08/30/21 0908 08/30/21 0930  ?BP: 118/78 123/69  ?Pulse: 60 (!) 57  ?Resp: 14 16  ?Temp: (!) 36.3 ?C 36.4 ?C  ?SpO2: 98% 99%  ?  ?Last Pain:  ?Vitals:  ? 08/30/21 0930  ?TempSrc: Oral  ?PainSc: 4   ? ? ?  ?  ?  ?  ?  ?  ? ?[place] Ouch ? ? ? ? ?

## 2021-08-30 NOTE — Assessment & Plan Note (Signed)
BMI 31 °

## 2021-08-30 NOTE — Progress Notes (Signed)
OR Nurse called for a report, report given, OR Consent signed by Pt and he said it was already discussed by the MD. Consent signed and attached to chart.  ?

## 2021-08-30 NOTE — Op Note (Signed)
Date of operation: 08/30/2020. ? ?Surgeon: Durward Fortes D.P.M. ? ?Preoperative diagnosis: Osteomyelitis left fifth metatarsal phalangeal joint. ? ?Postoperative diagnosis: Same. ? ?Procedure: Amputation left fifth toe with partial ray resection. ? ?Anesthesia: Local MAC. ? ?Hemostasis: Esmarch bandage left ankle. ? ?Estimated blood loss: 10 Cc. ? ?Cultures: Bone culture left fifth metatarsal head. ? ?Pathology: Partial left fifth ray. ? ?Implants: None. ? ?Complications: None. ? ?Operative indications: This is a 61 year old male with chronic ulceration on his left foot.  Recently progressed down to the level of the bone and patient referred to the hospital for admission and surgical debridement. ? ?Operative procedure: Patient was taken to the operating room and placed on the table in the supine position.  Following satisfactory sedation the left foot was prepped and draped in usual sterile fashion.  The foot was exsanguinated using an Esmarch bandage which was then left intact around the ankle to act as a tourniquet.  Attention was directed to the lateral aspect of the left foot where an elliptical incision was made from medial to lateral around the dorsal and plantar base of the fifth toe extended laterally excising the ulceration and then carried proximally along the fifth metatarsal shaft.  Incision was deepened via sharp and blunt dissection down to the level of the bone and joint which was freed from the surrounding anatomy and osteotomy performed through the fifth metatarsal shaft using a sagittal saw and the ray was then resected and removed in toto.  The tissues appeared healthy with no significant signs of purulence or devitalized tissue.  The wound was flushed with copious amounts of sterile saline and closed using 3-0 nylon vertical mattress and simple interrupted sutures.  Xeroform 4 x 4's ABD and Kerlix applied to the left foot.  Tourniquet was released.  Second Kerlix and Ace wrap were then applied for  compression.  Patient was awakened and transported the PACU having tolerated the anesthesia and procedures well. ? ? ?

## 2021-08-30 NOTE — Transfer of Care (Signed)
Immediate Anesthesia Transfer of Care Note ? ?Patient: Charles [name] ? ?Procedure(s) Performed: LEFT 5TH RAY RESECTION (Left: Foot) ? ?Patient Location: PACU ? ?Anesthesia Type:General ? ?Level of Consciousness: awake, drowsy and patient cooperative ? ?Airway & Oxygen Therapy: Patient Spontanous Breathing and Patient connected to face mask oxygen ? ?Post-op Assessment: Report given to RN and Post -op Vital signs reviewed and stable ? ?Post vital signs: Reviewed and stable ? ?Last Vitals:  ?Vitals Value Taken Time  ?BP 117/62 08/30/21 0834  ?Temp 36 ?C 08/30/21 0834  ?Pulse 63 08/30/21 0838  ?Resp 13 08/30/21 0838  ?SpO2 100 % 08/30/21 0838  ?Vitals shown include unvalidated device data. ? ?Last Pain:  ?Vitals:  ? 08/30/21 0834  ?TempSrc: Temporal  ?PainSc:   ?   ? ?  ? ?Complications: No notable events documented. ?

## 2021-08-30 NOTE — Assessment & Plan Note (Addendum)
The patient has a pain pump at home and gets his oral medications through pain management. ?

## 2021-08-30 NOTE — Progress Notes (Signed)
?  Progress Note ? ? ?Patient: Charles [name] OYD:[id number] DOB: 1961/01/12 DOA: 08/29/2021     1 ?DOS: the patient was seen and examined on 08/30/2021 ?  ? ? ?Assessment and Plan: ?* Foot osteomyelitis, left ([place]) ?Osteomyelitis of the distal left foot metatarsal and base of the proximal phalanx.  Patient went to the operating room by Dr. Cleda Mccreedy today.  Patient had amputation of left fifth toe with partial ray resection.  Continue vancomycin and cefepime ? ?Major depression, chronic ?Continue bupropion, buspirone and Cymbalta ? ?Hypothyroidism ?Continue Synthroid ? ?Hypokalemia ?Replaced ? ?Essential hypertension ?Blood pressure stable after holding medications. ? ?CAD S/P percutaneous coronary angioplasty ?Continue atorvastatin and restart aspirin tomorrow. ?Hold carvedilol for relative hypotension ? ?Impaired fasting glucose ?Patient is not a diabetic.  Get rid of sliding scale.  Hemoglobin A1c 5.2. ? ?Chronic pain ?Patient states that he has a pain pump but also takes oral medications. ? ?Obesity (BMI 30-39.9) ?BMI 31 ? ?BPH (benign prostatic hyperplasia) ?Continue Flomax ? ? ? ? ?  ? ?Subjective: Patient seen after Dr. Caryl Comes podiatry took to the operating room for amputation.  Patient was admitted with osteomyelitis.  Complains of some pain in the foot.  Patient does have chronic pain and a pain pump. ? ?Physical Exam: ?Vitals:  ? 08/30/21 0902 08/30/21 0906 08/30/21 0908 08/30/21 0930  ?BP:   118/78 123/69  ?Pulse: 60 64 60 (!) 57  ?Resp: '12 16 14 16  '$ ?Temp:   (!) 97.4 ?F (36.3 ?C) 97.6 ?F (36.4 ?C)  ?TempSrc:    Oral  ?SpO2: 98% 97% 98% 99%  ?Weight:      ?Height:      ? ?Physical Exam ?HENT:  ?   Head: Normocephalic.  ?   Mouth/Throat:  ?   Pharynx: No oropharyngeal exudate.  ?Eyes:  ?   General: Lids are normal.  ?   Conjunctiva/sclera: Conjunctivae normal.  ?Cardiovascular:  ?   Rate and Rhythm: Normal rate and regular rhythm.  ?   Heart sounds: Normal heart sounds, S1 normal and S2 normal.  ?Pulmonary:  ?    Breath sounds: No decreased breath sounds, wheezing, rhonchi or rales.  ?Abdominal:  ?   Palpations: Abdomen is soft.  ?   Tenderness: There is no abdominal tenderness.  ?Musculoskeletal:  ?   Right lower leg: Swelling present.  ?   Left lower leg: Swelling present.  ?Skin: ?   General: Skin is warm.  ?   Findings: No rash.  ?   Comments: Left foot wrapped in a large pressure dressing.  ?Neurological:  ?   Mental Status: He is alert and oriented to person, place, and time.  ?  ?Data Reviewed: ?Hemoglobin A1c 5.2.  Creatinine 0.92, ferritin 76, hemoglobin 10.3, white blood cell count 4.5 ? ? ? ?Disposition: ?Status is: Inpatient ?Remains inpatient appropriate because: Went to the operating room today for amputation for osteomyelitis.  Continue IV antibiotics. ?Planned Discharge Destination: Home ? ? ?Author: ?Loletha Grayer, MD ?08/30/2021 4:01 PM ? ?For on call review [email or web address].  ?

## 2021-08-30 NOTE — Interval H&P Note (Signed)
History and Physical Interval Note: ? ?08/30/2021 ?7:34 AM ? ?Charles [name]  has presented today for surgery, with the diagnosis of osteomyelitis.  The various methods of treatment have been discussed with the patient and family. After consideration of risks, benefits and other options for treatment, the patient has consented to  Procedure(s): ?LEFT 5TH RAY RESECTION (Left) as a surgical intervention.  The patient's history has been reviewed, patient examined, no change in status, stable for surgery.  I have reviewed the patient's chart and labs.  Questions were answered to the patient's satisfaction.   ? ? ?Durward Fortes ? ? ?

## 2021-08-30 NOTE — Assessment & Plan Note (Signed)
-   Continue Flomax 

## 2021-08-31 ENCOUNTER — Encounter: Payer: Self-pay | Admitting: Podiatry

## 2021-08-31 DIAGNOSIS — M86472 Chronic osteomyelitis with draining sinus, left ankle and foot: Secondary | ICD-10-CM | POA: Diagnosis not present

## 2021-08-31 DIAGNOSIS — F329 Major depressive disorder, single episode, unspecified: Secondary | ICD-10-CM | POA: Diagnosis not present

## 2021-08-31 DIAGNOSIS — G8929 Other chronic pain: Secondary | ICD-10-CM | POA: Diagnosis not present

## 2021-08-31 DIAGNOSIS — N400 Enlarged prostate without lower urinary tract symptoms: Secondary | ICD-10-CM

## 2021-08-31 DIAGNOSIS — I10 Essential (primary) hypertension: Secondary | ICD-10-CM | POA: Diagnosis not present

## 2021-08-31 LAB — CBC
HCT: 33.7 % — ABNORMAL LOW (ref 39.0–52.0)
Hemoglobin: 10.4 g/dL — ABNORMAL LOW (ref 13.0–17.0)
MCH: 27.8 pg (ref 26.0–34.0)
MCHC: 30.9 g/dL (ref 30.0–36.0)
MCV: 90.1 fL (ref 80.0–100.0)
Platelets: 305 10*3/uL (ref 150–400)
RBC: 3.74 MIL/uL — ABNORMAL LOW (ref 4.22–5.81)
RDW: 13.5 % (ref 11.5–15.5)
WBC: 5.4 10*3/uL (ref 4.0–10.5)
nRBC: 0 % (ref 0.0–0.2)

## 2021-08-31 LAB — BASIC METABOLIC PANEL
Anion gap: 10 (ref 5–15)
BUN: 18 mg/dL (ref 8–23)
CO2: 25 mmol/L (ref 22–32)
Calcium: 8.6 mg/dL — ABNORMAL LOW (ref 8.9–10.3)
Chloride: 107 mmol/L (ref 98–111)
Creatinine, Ser: 0.93 mg/dL (ref 0.61–1.24)
GFR, Estimated: >60 mL/min (ref >60)
Glucose, Bld: 110 mg/dL — ABNORMAL HIGH (ref 70–99)
Potassium: 4.5 mmol/L (ref 3.5–5.1)
Sodium: 142 mmol/L (ref 135–145)

## 2021-08-31 MED ORDER — METRONIDAZOLE 500 MG PO TABS
500.0000 mg | ORAL_TABLET | Freq: Two times a day (BID) | ORAL | Status: DC
  Administered 2021-08-31 – 2021-09-01 (×2): 500 mg via ORAL
  Filled 2021-08-31 (×2): qty 1

## 2021-08-31 MED ORDER — AMLODIPINE BESYLATE 5 MG PO TABS
5.0000 mg | ORAL_TABLET | Freq: Every day | ORAL | Status: DC
  Administered 2021-08-31 – 2021-09-02 (×3): 5 mg via ORAL
  Filled 2021-08-31 (×3): qty 1

## 2021-08-31 NOTE — Progress Notes (Signed)
PHARMACIST - PHYSICIAN COMMUNICATION ? ?CONCERNING: Antibiotic IV to Oral Route Change Policy ? ?RECOMMENDATION: ?This patient is receiving metronidazole by the intravenous route.  Based on criteria approved by the Pharmacy and Therapeutics Committee, the antibiotic(s) is/are being converted to the equivalent oral dose form(s). ? ? ?DESCRIPTION: ?These criteria include: ?Patient being treated for a respiratory tract infection, urinary tract infection, cellulitis or clostridium difficile associated diarrhea if on metronidazole ?The patient is not neutropenic and does not exhibit a GI malabsorption state ?The patient is eating (either orally or via tube) and/or has been taking other orally administered medications for a least 24 hours ?The patient is improving clinically and has a Tmax < 100.5 ? ?If you have questions about this conversion, please contact the Pharmacy Department  ? ?Benita Gutter  ?08/31/21  ?  ?

## 2021-08-31 NOTE — Progress Notes (Signed)
?  Progress Note ? ? ?Patient: Charles [name] NOM:[id number] DOB: [birth year] DOA: 08/29/2021     2 ?DOS: the patient was seen and examined on 08/31/2021 ?  ? ?Assessment and Plan: ?* Foot osteomyelitis, left ([place]) ?Osteomyelitis of the distal left foot metatarsal and base of the proximal phalanx.  Patient went to the operating room by Dr. Cleda Mccreedy 08/31/2021.  Patient had amputation of left fifth toe with partial ray resection.  Continue vancomycin cefepime and Flagyl. ? ?Chronic pain ?Patient states that he has a pain pump but also takes oral medications.  Needed to add IV morphine yesterday postoperatively.  Patient asking how he is going to get his pain medications at home.  He should have enough to last him until his next pain management appointment. ? ?Major depression, chronic ?Continue bupropion, buspirone and Cymbalta ? ?Hypothyroidism ?Continue Synthroid ? ?Hypokalemia ?Replaced ? ?Essential hypertension ?Restart Norvasc 5 mg daily ? ?CAD S/P percutaneous coronary angioplasty ?Continue atorvastatin and restart aspirin tomorrow. ?Hold carvedilol with heart rate being in the 60s ? ?Impaired fasting glucose ?Patient is not a diabetic.  Get rid of sliding scale.  Hemoglobin A1c 5.2. ? ?Obesity (BMI 30-39.9) ?BMI 31 ? ?BPH (benign prostatic hyperplasia) ?Continue Flomax ? ? ? ? ?  ? ?Subjective: Patient still has some pain in his left foot.  No shortness of breath or chest pain.  Patient asking about his pain medications upon going home.  Since he has been in the hospital for few days he should have pain medications to last him until he can get to his pain management doctor.  Admitted with osteomyelitis. ? ?Physical Exam: ?Vitals:  ? 08/30/21 0930 08/30/21 2133 08/31/21 0625 08/31/21 0747  ?BP: 123/69 129/69 133/72 (!) 142/82  ?Pulse: (!) 57 75 70 63  ?Resp: '16 18 16 16  '$ ?Temp: 97.6 ?F (36.4 ?C) 98.3 ?F (36.8 ?C) 98.2 ?F (36.8 ?C) 98.2 ?F (36.8 ?C)  ?TempSrc: Oral Oral Oral Oral  ?SpO2: 99% 98% 99% 98%  ?Weight:       ?Height:      ? ?Physical Exam ?HENT:  ?   Head: Normocephalic.  ?   Mouth/Throat:  ?   Pharynx: No oropharyngeal exudate.  ?Eyes:  ?   General: Lids are normal.  ?   Conjunctiva/sclera: Conjunctivae normal.  ?Cardiovascular:  ?   Rate and Rhythm: Normal rate and regular rhythm.  ?   Heart sounds: Normal heart sounds, S1 normal and S2 normal.  ?Pulmonary:  ?   Breath sounds: No decreased breath sounds, wheezing, rhonchi or rales.  ?Abdominal:  ?   Palpations: Abdomen is soft.  ?   Tenderness: There is no abdominal tenderness.  ?Musculoskeletal:  ?   Right lower leg: Swelling present.  ?   Left lower leg: Swelling present.  ?Skin: ?   General: Skin is warm.  ?   Findings: No rash.  ?   Comments: Left foot wrapped in a large pressure dressing.  ?Neurological:  ?   Mental Status: He is alert and oriented to person, place, and time.  ?  ?Data Reviewed: ?Hemoglobin 10.4, creatinine 0.93, hemoglobin A1c 5.2.  So far bone culture not seeing any white blood cells or organisms. ? ?Disposition: ?Status is: Inpatient ?Remains inpatient appropriate because: Requiring IV antibiotics for osteomyelitis.  Disposition will be up to podiatry. ?Planned Discharge Destination: Home ? ? ?Author: ?Loletha Grayer, MD ?08/31/2021 2:40 PM ? ?For on call review [email or web address].  ?
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
## 2021-09-01 DIAGNOSIS — F329 Major depressive disorder, single episode, unspecified: Secondary | ICD-10-CM | POA: Diagnosis not present

## 2021-09-01 DIAGNOSIS — G8929 Other chronic pain: Secondary | ICD-10-CM | POA: Diagnosis not present

## 2021-09-01 DIAGNOSIS — M86472 Chronic osteomyelitis with draining sinus, left ankle and foot: Secondary | ICD-10-CM | POA: Diagnosis not present

## 2021-09-01 DIAGNOSIS — I10 Essential (primary) hypertension: Secondary | ICD-10-CM | POA: Diagnosis not present

## 2021-09-01 LAB — CREATININE, SERUM
Creatinine, Ser: 0.85 mg/dL (ref 0.61–1.24)
GFR, Estimated: >60 mL/min (ref >60)

## 2021-09-01 LAB — MRSA NEXT GEN BY PCR, NASAL: MRSA by PCR Next Gen: NOT DETECTED

## 2021-09-01 MED ORDER — SODIUM CHLORIDE 0.9 % IV SOLN
3.0000 g | Freq: Four times a day (QID) | INTRAVENOUS | Status: DC
  Administered 2021-09-01 – 2021-09-02 (×4): 3 g via INTRAVENOUS
  Filled 2021-09-01: qty 3
  Filled 2021-09-01 (×2): qty 8
  Filled 2021-09-01: qty 3
  Filled 2021-09-01: qty 8

## 2021-09-01 MED ORDER — CARVEDILOL 3.125 MG PO TABS
3.1250 mg | ORAL_TABLET | Freq: Two times a day (BID) | ORAL | Status: DC
  Administered 2021-09-01 – 2021-09-02 (×2): 3.125 mg via ORAL
  Filled 2021-09-01 (×2): qty 1

## 2021-09-01 NOTE — Progress Notes (Signed)
?  Progress Note ? ? ?Patient: Charles [name] VHQ:[id number] DOB: December 06, 1960 DOA: 08/29/2021     3 ?DOS: the patient was seen and examined on 09/01/2021 ?  ? ? ?Assessment and Plan: ?* Foot osteomyelitis, left ([place]) ?Osteomyelitis of the distal left foot metatarsal and base of the proximal phalanx.  Patient went to the operating room by Dr. Cleda Mccreedy 08/31/2021.  Patient had amputation of left fifth toe with partial ray resection.  Continue vancomycin cefepime and Flagyl.  Case discussed with infectious disease and likely she was changed antibiotics over to Unasyn with rare Enterococcus growing out of the culture.  Awaiting pathology report to see bone margins to decide on whether antibiotics needed IV versus whether we can do them orally. ? ?Chronic pain ?The patient has a pain pump at home and gets his oral medications through pain management. ? ?Major depression, chronic ?Continue bupropion, buspirone and Cymbalta ? ?Hypothyroidism ?Continue Synthroid ? ?Hypokalemia ?Replaced ? ?Essential hypertension ?Continue Norvasc 5 mg daily.  Add back low-dose Coreg. ? ?CAD S/P percutaneous coronary angioplasty ?Continue atorvastatin, aspirin and restart low-dose Coreg. ? ?Impaired fasting glucose ?Patient is not a diabetic.  Hemoglobin A1c 5.2. ? ?Obesity (BMI 30-39.9) ?BMI 31 ? ?BPH (benign prostatic hyperplasia) ?Continue Flomax ? ? ? ?Subjective: Patient feeling okay offers no complaints except for pain in his left foot.  Came in with osteomyelitis.  Swelling has come down on his legs. ? ?Physical Exam: ?Vitals:  ? 08/31/21 1526 08/31/21 1944 09/01/21 0421 09/01/21 0739  ?BP: (!) 143/77 (!) 143/76 (!) 147/77 (!) 143/85  ?Pulse: 78 65 74 63  ?Resp: '15 17 16 17  '$ ?Temp: 98.1 ?F (36.7 ?C) 98.5 ?F (36.9 ?C) 98.3 ?F (36.8 ?C) 98 ?F (36.7 ?C)  ?TempSrc: Oral     ?SpO2: 100% 97% 100% 97%  ?Weight:      ?Height:      ? ?Physical Exam ?HENT:  ?   Head: Normocephalic.  ?   Mouth/Throat:  ?   Pharynx: No oropharyngeal exudate.  ?Eyes:  ?    General: Lids are normal.  ?   Conjunctiva/sclera: Conjunctivae normal.  ?Cardiovascular:  ?   Rate and Rhythm: Normal rate and regular rhythm.  ?   Heart sounds: Normal heart sounds, S1 normal and S2 normal.  ?Pulmonary:  ?   Breath sounds: No decreased breath sounds, wheezing, rhonchi or rales.  ?Abdominal:  ?   Palpations: Abdomen is soft.  ?   Tenderness: There is no abdominal tenderness.  ?Musculoskeletal:  ?   Right lower leg: Swelling present.  ?   Left lower leg: Swelling present.  ?Skin: ?   General: Skin is warm.  ?   Findings: No rash.  ?   Comments: Left foot wrapped in a large pressure dressing.  ?Neurological:  ?   Mental Status: He is alert and oriented to person, place, and time.  ?  ?Data Reviewed: ?Surgical pathology still pending.  Awaiting to see if bone margins are clear. ?Creatinine 0.85 ?Microbiology bone culture growing rare Enterococcus ? ?Disposition: ?Status is: Inpatient ?Remains inpatient appropriate because: Leading to see pathology report to his decide on IV versus oral antibiotics. ? ?Planned Discharge Destination: Home ? ? ?Author: ?Loletha Grayer, MD ?09/01/2021 3:41 PM ? ?For on call review [email or web address].  ?

## 2021-09-01 NOTE — Plan of Care (Signed)
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
## 2021-09-01 NOTE — Progress Notes (Signed)
2 Days Post-Op  ? ?Subjective/Chief Complaint: ?Patient seen.  States the foot is feeling better today.  Overall doing well. ? ? ?Objective: ?Vital signs in last 24 hours: ?Temp:  [98 ?F (36.7 ?C)-98.5 ?F (36.9 ?C)] 98 ?F (36.7 ?C) (05/15 0739) ?Pulse Rate:  [63-78] 63 (05/15 0739) ?Resp:  [15-17] 17 (05/15 0739) ?BP: (143-147)/(76-85) 143/85 (05/15 0739) ?SpO2:  [97 %-100 %] 97 % (05/15 0739) ?Last BM Date : 09/01/21 ? ?Intake/Output from previous day: ?05/14 0701 - 05/15 0700 ?In: 1391.2 [P.O.:350; IV Piggyback:1041.2] ?Out: [place] [HKVQQ:[id number]] ?Intake/Output this shift: ?Total I/O ?In: 3 [I.V.:3] ?Out: -  ? ?Dressing on the left foot is dry and intact. ? ?Lab Results:  ?Recent Labs  ?  08/30/21 ?0421 08/31/21 ?0405  ?WBC 4.5 5.4  ?HGB 10.3* 10.4*  ?HCT 32.7* 33.7*  ?PLT 273 305  ? ?BMET ?Recent Labs  ?  08/30/21 ?0421 08/31/21 ?0405 09/01/21 ?0346  ?NA 142 142  --   ?K 4.2 4.5  --   ?CL 112* 107  --   ?CO2 27 25  --   ?GLUCOSE 112* 110*  --   ?BUN 23 18  --   ?CREATININE 0.92 0.93 0.85  ?CALCIUM 8.6* 8.6*  --   ? ?PT/INR ?No results for input(s): LABPROT, INR in the last 72 hours. ?ABG ?No results for input(s): PHART, HCO3 in the last 72 hours. ? ?Invalid input(s): PCO2, PO2 ? ?Studies/Results: ?No results found. ? ?Anti-infectives: ?Anti-infectives (From admission, onward)  ? ? Start     Dose/Rate Route Frequency Ordered Stop  ? 08/31/21 2200  metroNIDAZOLE (FLAGYL) tablet 500 mg       ? 500 mg Oral Every 12 hours 08/31/21 1335 09/05/21 0959  ? 08/30/21 1300  vancomycin (VANCOREADY) IVPB 1750 mg/350 mL  Status:  Discontinued       ? 1,750 mg ?175 mL/hr over 120 Minutes Intravenous Every 24 hours 08/29/21 1006 08/30/21 1117  ? 08/30/21 1300  vancomycin (VANCOREADY) IVPB 1500 mg/300 mL       ? 1,500 mg ?150 mL/hr over 120 Minutes Intravenous Every 12 hours 08/30/21 1117    ? 08/29/21 1400  ceFEPIme (MAXIPIME) 2 g in sodium chloride 0.9 % 100 mL IVPB  Status:  Discontinued       ? 2 g ?200 mL/hr over 30 Minutes  Intravenous Every 8 hours 08/29/21 1000 08/29/21 1008  ? 08/29/21 1400  ceFEPIme (MAXIPIME) 2 g in sodium chloride 0.9 % 100 mL IVPB       ? 2 g ?200 mL/hr over 30 Minutes Intravenous Every 8 hours 08/29/21 1008 09/05/21 1629  ? 08/29/21 1000  vancomycin (VANCOREADY) IVPB 2000 mg/400 mL       ? 2,000 mg ?200 mL/hr over 120 Minutes Intravenous  Once 08/29/21 0959 08/29/21 1317  ? 08/29/21 0730  metroNIDAZOLE (FLAGYL) IVPB 500 mg  Status:  Discontinued       ? 500 mg ?100 mL/hr over 60 Minutes Intravenous Every 12 hours 08/29/21 0724 08/31/21 1335  ? ?  ? ? ?Assessment/Plan: ?s/p Procedure(s): ?LEFT 5TH RAY RESECTION (Left) ?Assessment: Stable status post fifth ray resection left foot. ? ? ?Plan: Dressing left intact today.  Still waiting for pathology and culture results.  Discussed with the patient that his foot looked really good yesterday and hopefully we will be able to discharge on oral antibiotics.  Hopefully pathology returns tomorrow and most likely discharge tomorrow. ? LOS: 3 days  ? ? ?Durward Fortes ?09/01/2021 ? ?

## 2021-09-01 NOTE — Progress Notes (Signed)
Nutrition Follow-Up Brief Note ? ?RD following for labs results to check for potential micronutrient deficiencies.  ? ?TIBC, transferrin, folate, and vitamin D WDL. Thiamine, B-12, zinc, copper, vitamin C, vitamin E< and vitamin A have not resulted yet. Noted [place] low (18). RD reached out to MD via secure chat for potential supplementation(325 mg ferrous sulfate daily); RD awaiting response.  ? ?Will continue to follow for labs and nutritional needs.  ? ?Loistine Chance, RD, LDN, CDCES ?Registered Dietitian II ?Certified Diabetes Care and Education Specialist ?Please refer to [facility] for RD and/or RD on-call/weekend/after hours pager  ?

## 2021-09-02 ENCOUNTER — Other Ambulatory Visit (HOSPITAL_COMMUNITY): Payer: Self-pay

## 2021-09-02 DIAGNOSIS — E039 Hypothyroidism, unspecified: Secondary | ICD-10-CM | POA: Diagnosis not present

## 2021-09-02 DIAGNOSIS — F329 Major depressive disorder, single episode, unspecified: Secondary | ICD-10-CM | POA: Diagnosis not present

## 2021-09-02 DIAGNOSIS — M86472 Chronic osteomyelitis with draining sinus, left ankle and foot: Secondary | ICD-10-CM | POA: Diagnosis not present

## 2021-09-02 DIAGNOSIS — M8610 Other acute osteomyelitis, unspecified site: Secondary | ICD-10-CM

## 2021-09-02 DIAGNOSIS — G8929 Other chronic pain: Secondary | ICD-10-CM | POA: Diagnosis not present

## 2021-09-02 DIAGNOSIS — L089 Local infection of the skin and subcutaneous tissue, unspecified: Secondary | ICD-10-CM

## 2021-09-02 LAB — COPPER, SERUM: Copper: 128 ug/dL (ref 69–132)

## 2021-09-02 LAB — SURGICAL PATHOLOGY

## 2021-09-02 LAB — VITAMIN E
Vitamin E (Alpha Tocopherol): 6.8 mg/L — ABNORMAL LOW (ref 9.0–29.0)
Vitamin E(Gamma Tocopherol): 2.6 mg/L (ref 0.5–4.9)

## 2021-09-02 LAB — ZINC: Zinc: 42 ug/dL — ABNORMAL LOW (ref 44–115)

## 2021-09-02 MED ORDER — AMOXICILLIN 500 MG PO CAPS: 1000.0000 mg | ORAL_CAPSULE | Freq: Three times a day (TID) | ORAL | 0 refills | Status: DC

## 2021-09-02 MED ORDER — CARVEDILOL 3.125 MG PO TABS: 3.1250 mg | ORAL_TABLET | Freq: Two times a day (BID) | ORAL | 0 refills | Status: DC

## 2021-09-02 MED ORDER — ASCORBIC ACID 500 MG PO TABS: 500.0000 mg | ORAL_TABLET | Freq: Every day | ORAL | 0 refills | Status: AC

## 2021-09-02 MED ORDER — VITAMIN D 25 MCG (1000 UNIT) PO TABS: 2000.0000 [IU] | ORAL_TABLET | Freq: Every day | ORAL | Status: DC

## 2021-09-02 MED ORDER — ZINC SULFATE 220 (50 ZN) MG PO CAPS: 220.0000 mg | ORAL_CAPSULE | Freq: Every day | ORAL | Status: DC

## 2021-09-02 MED ORDER — ZINC SULFATE 220 (50 ZN) MG PO CAPS: 220.0000 mg | ORAL_CAPSULE | Freq: Every day | ORAL | 0 refills | Status: AC

## 2021-09-02 MED ORDER — VITAMIN E 45 MG (100 UNIT) PO CAPS
400.0000 [IU] | ORAL_CAPSULE | Freq: Every day | ORAL | Status: DC
  Administered 2021-09-02: 400 [IU] via ORAL
  Filled 2021-09-02: qty 4

## 2021-09-02 MED ORDER — VITAMIN D3 25 MCG PO TABS: 2000.0000 [IU] | ORAL_TABLET | Freq: Every day | ORAL | 0 refills | Status: DC

## 2021-09-02 NOTE — Discharge Summary (Signed)
?Physician Discharge Summary ?  ?Patient: Charles [name] MRN: [id number] DOB: 10/21/1960  ?Admit date:     08/29/2021  ?Discharge date: 09/02/21  ?Discharge Physician: Loletha Grayer  ? ?PCP: Mylinda Latina, PA-C  ? ?Recommendations at discharge:  ? ?Follow-up PCP 5 days ?Follow-up Dr. Cleda Mccreedy podiatry on Friday ?Follow-up Dr. Steva Ready 3 weeks ? ?Discharge Diagnoses: ?Principal Problem: ?  Foot osteomyelitis, left ([place]) ?Active Problems: ?  Chronic pain ?  Major depression, chronic ?  Hypothyroidism ?  Hypokalemia ?  Essential hypertension ?  CAD S/P percutaneous coronary angioplasty ?  Impaired fasting glucose ?  BPH (benign prostatic hyperplasia) ?  Obesity (BMI 30-39.9) ? ? ?Hospital Course: ?The patient was admitted to the hospital on 08/29/2021 and discharged on 09/02/2021.  Came in with a wound infection on his left foot.  X-ray consistent with osteomyelitis.  Patient was started on aggressive triple antibiotics with vancomycin Maxipime and Flagyl.  The patient was brought to the operating room on 08/30/2021 for a amputation of the fifth toe with partial ray resection.  Wound culture growing Enterococcus and antibiotics changed to Unasyn.  Wound pathology showed that skin and soft tissue with chronic ulceration and underlying acute osteomyelitis.  Acute osteomyelitis involves the bone margin.  The patient discussed things with infectious disease specialist and he wanted to go on oral antibiotics instead of doing IV.  The patient will be discharged on amoxicillin 500 mg (2 tablets) 3 times a day for 4 weeks.  Follow-up closely as outpatient with podiatry.  Patient will follow-up with his pain management doctor and continue his outpatient regimen. ? ?Assessment and Plan: ?* Foot osteomyelitis, left ([place]) ?Osteomyelitis of the distal left foot metatarsal and base of the proximal phalanx.  Patient went to the operating room by Dr. Cleda Mccreedy 08/31/2021.  Patient had amputation of left fifth toe with partial ray  resection.  Continue vancomycin cefepime and Flagyl.  Case discussed with infectious disease and likely she was changed antibiotics over to Unasyn with rare Enterococcus growing out of the culture.  Pathology shows that osteomyelitis involves the bone margin.  Infectious disease decided with the patient to do oral antibiotics amoxicillin 1 g 3 times daily for 4 weeks ? ?Chronic pain ?The patient has a pain pump at home and gets his oral medications through pain management. ? ?Major depression, chronic ?Continue bupropion, buspirone and Cymbalta ? ?Hypothyroidism ?Continue Synthroid ? ?Hypokalemia ?Replaced ? ?Essential hypertension ?Continue Norvasc 5 mg daily.  Added back low-dose Coreg. ? ?CAD S/P percutaneous coronary angioplasty ?Continue atorvastatin, aspirin and continue low-dose Coreg. ? ?Impaired fasting glucose ?Patient is not a diabetic.  Hemoglobin A1c 5.2. ? ?Obesity (BMI 30-39.9) ?BMI 31 ? ?BPH (benign prostatic hyperplasia) ?Continue Flomax ? ? ? ? ?  ? ? ?Consultants: Podiatry, infectious disease ?Procedures performed: Amputation of the left fifth toe and partial ray amputation ?Disposition: Home ?Diet recommendation:  ?Cardiac diet ?DISCHARGE MEDICATION: ?Allergies as of 09/02/2021   ? ?   Reactions  ? Lisinopril Cough  ? ?  ? ?  ?Medication List  ?  ? ?STOP taking these medications   ? ?diclofenac Sodium 1 % Gel ?Commonly known as: VOLTAREN ?  ?doxycycline 100 MG EC tablet ?Commonly known as: DORYX ?  ?naloxegol oxalate 25 MG Tabs tablet ?Commonly known as: Movantik ?  ?ondansetron 8 MG tablet ?Commonly known as: ZOFRAN ?  ? ?  ? ?TAKE these medications   ? ?albuterol 108 (90 Base) MCG/ACT inhaler ?Commonly known as: VENTOLIN HFA ?  Inhale 2 puffs into the lungs every 6 (six) hours as needed for wheezing or shortness of breath. ?  ?amLODipine 5 MG tablet ?Commonly known as: NORVASC ?Take 1 tablet (5 mg total) by mouth daily. ?  ?amoxicillin 500 MG capsule ?Commonly known as: AMOXIL ?Take 2 capsules  (1,000 mg total) by mouth 3 (three) times daily for 28 days. ?  ?ascorbic acid 500 MG tablet ?Commonly known as: VITAMIN C ?Take 1 tablet (500 mg total) by mouth daily. ?  ?aspirin EC 81 MG tablet ?Take 81 mg by mouth daily. Swallow whole. ?  ?atorvastatin 40 MG tablet ?Commonly known as: LIPITOR ?Take one tab at bed time for cholesterol ?What changed:  ?how much to take ?how to take this ?when to take this ?  ?buPROPion 150 MG 24 hr tablet ?Commonly known as: Wellbutrin XL ?Take 1 tablet (150 mg total) by mouth daily. ?  ?busPIRone 30 MG tablet ?Commonly known as: BUSPAR ?TAKE ONE TABLET BY MOUTH 3 TIMES DAILY AS NEEDED ?  ?carvedilol 3.125 MG tablet ?Commonly known as: COREG ?Take 1 tablet (3.125 mg total) by mouth 2 (two) times daily with a meal. ?What changed:  ?medication strength ?how much to take ?  ?DULoxetine 60 MG capsule ?Commonly known as: CYMBALTA ?TAKE 1 CAPSULE BY MOUTH DAILY. ?  ?levothyroxine 75 MCG tablet ?Commonly known as: SYNTHROID ?Take 1 tablet (75 mcg total) by mouth daily before breakfast. ?  ?lisdexamfetamine 40 MG capsule ?Commonly known as: Vyvanse ?Take 1 capsule (40 mg total) by mouth every morning. ?  ?Oxycodone HCl 10 MG Tabs ?Take 10 mg by mouth 6 (six) times daily. ?  ?sildenafil 20 MG tablet ?Commonly known as: REVATIO ?Take 1 tablet (20 mg total) by mouth daily as needed (ED). ?  ?tamsulosin 0.4 MG Caps capsule ?Commonly known as: FLOMAX ?Take 1 capsule (0.4 mg total) by mouth daily. ?What changed: when to take this ?  ?Vitamin D3 25 MCG tablet ?Commonly known as: Vitamin D ?Take 2 tablets (2,000 Units total) by mouth daily. ?Start taking on: Sep 03, 2021 ?  ?zinc sulfate 220 (50 Zn) MG capsule ?Take 1 capsule (220 mg total) by mouth daily. ?Start taking on: Sep 03, 2021 ?  ?zolpidem 10 MG tablet ?Commonly known as: AMBIEN ?TAKE 1 TABLET BY MOUTH AT BEDTIME AS NEEDED FOR SLEEP ?  ? ?  ? ? Follow-up Information   ? ? McDonough, Lauren K, PA-C Follow up in 5 day(s).   ?Specialty:  Physician Assistant ?Contact information: ?[address] ?[place] [address] ?[phone] ? ? ?  ?  ? ? Sharlotte Alamo, DPM Follow up in 3 day(s).   ?Specialty: Podiatry ?Contact information: ?[address] ?[place] [address] ?[phone] ? ? ?  ?  ? ? Tsosie Billing, MD Follow up in 3 week(s).   ?Specialty: Infectious Diseases ?Contact information: ?[place] [address] ?[phone] ? ? ?  ?  ? ?  ?  ? ?  ? ?Discharge Exam: ?Filed Weights  ? 08/29/21 0714  ?Weight: 106.6 kg  ? ?Physical Exam ?HENT:  ?   Head: Normocephalic.  ?   Mouth/Throat:  ?   Pharynx: No oropharyngeal exudate.  ?Eyes:  ?   General: Lids are normal.  ?   Conjunctiva/sclera: Conjunctivae normal.  ?Cardiovascular:  ?   Rate and Rhythm: Normal rate and regular rhythm.  ?   Heart sounds: Normal heart sounds, S1 normal and S2 normal.  ?Pulmonary:  ?  Breath sounds: No decreased breath sounds, wheezing, rhonchi or rales.  ?Abdominal:  ?   Palpations: Abdomen is soft.  ?   Tenderness: There is no abdominal tenderness.  ?Musculoskeletal:  ?   Right lower leg: Swelling present.  ?   Left lower leg: Swelling present.  ?Skin: ?   General: Skin is warm.  ?   Findings: No rash.  ?   Comments: Left foot wrapped in a large pressure dressing.  Pictures from when podiatry rounded.  ?Neurological:  ?   Mental Status: He is alert and oriented to person, place, and time.  ?  ? ? ?Condition at discharge: stable ? ?The results of significant diagnostics from this hospitalization (including imaging, microbiology, ancillary and laboratory) are listed below for reference.  ? ?Imaging Studies: ?US Venous Img Lower Unilateral Left (DVT) ? ?Result Date: 08/29/2021 ?CLINICAL DATA:  Left lower extremity pain and edema. Evaluate for DVT. EXAM: LEFT LOWER EXTREMITY VENOUS DOPPLER ULTRASOUND TECHNIQUE: Gray-scale sonography with graded compression, as well as color Doppler and duplex ultrasound were performed to evaluate the lower  extremity deep venous systems from the level of the common femoral vein and including the common femoral, femoral, profunda femoral, popliteal and calf veins including the posterior tibial, peroneal and gastrocnemius

## 2021-09-02 NOTE — TOC Benefit Eligibility Note (Signed)
Patient Advocate Encounter ? ?Insurance verification completed.   ? ?The patient is currently admitted and upon discharge could be taking amoxicillin-clavulanate (Augmentin) 1000-62.5 mg. ? ?The current 30 day co-pay is, $10.00.  ? ?The patient is insured through [employer]  ? ? ? ?Lyndel Safe, CPhT ?Pharmacy Patient Advocate Specialist ?[place] Patient Advocate Team ?Direct Number: [phone]  Fax: [phone] ? ? ? ? ? ?  ?

## 2021-09-02 NOTE — Progress Notes (Signed)
3 Days Post-Op  ? ?Subjective/Chief Complaint: ?Patient seen.  No complaints.  Ready to go home. ? ? ?Objective: ?Vital signs in last 24 hours: ?Temp:  [97.9 ?F (36.6 ?C)-98.1 ?F (36.7 ?C)] 97.9 ?F (36.6 ?C) (05/16 1100) ?Pulse Rate:  [54-70] 64 (05/16 1100) ?Resp:  [16-18] 17 (05/16 1100) ?BP: (133-150)/(82-92) 138/88 (05/16 1100) ?SpO2:  [96 %-100 %] 99 % (05/16 1100) ?Last BM Date : 09/01/21 ? ?Intake/Output from previous day: ?05/15 0701 - 05/16 0700 ?In: 2664.6 [P.O.:1700; I.V.:3; IV Piggyback:961.6] ?Out: 2425 [Urine:2425] ?Intake/Output this shift: ?No intake/output data recorded. ? ?Bandages dry and intact.  Upon removal minimal bleeding.  The incision is well coapted with no significant cellulitis or drainage. ? ? ?Lab Results:  ?Recent Labs  ?  08/31/21 ?0405  ?WBC 5.4  ?HGB 10.4*  ?HCT 33.7*  ?PLT 305  ? ?BMET ?Recent Labs  ?  08/31/21 ?0405 09/01/21 ?0346  ?NA 142  --   ?K 4.5  --   ?CL 107  --   ?CO2 25  --   ?GLUCOSE 110*  --   ?BUN 18  --   ?CREATININE 0.93 0.85  ?CALCIUM 8.6*  --   ? ?PT/INR ?No results for input(s): LABPROT, INR in the last 72 hours. ?ABG ?No results for input(s): PHART, HCO3 in the last 72 hours. ? ?Invalid input(s): PCO2, PO2 ? ?Studies/Results: ?No results found. ? ?Anti-infectives: ?Anti-infectives (From admission, onward)  ? ? Start     Dose/Rate Route Frequency Ordered Stop  ? 09/01/21 1800  Ampicillin-Sulbactam (UNASYN) 3 g in sodium chloride 0.9 % 100 mL IVPB       ? 3 g ?200 mL/hr over 30 Minutes Intravenous Every 6 hours 09/01/21 1620    ? 08/31/21 2200  metroNIDAZOLE (FLAGYL) tablet 500 mg  Status:  Discontinued       ? 500 mg Oral Every 12 hours 08/31/21 1335 09/01/21 1620  ? 08/30/21 1300  vancomycin (VANCOREADY) IVPB 1750 mg/350 mL  Status:  Discontinued       ? 1,750 mg ?175 mL/hr over 120 Minutes Intravenous Every 24 hours 08/29/21 1006 08/30/21 1117  ? 08/30/21 1300  vancomycin (VANCOREADY) IVPB 1500 mg/300 mL  Status:  Discontinued       ? 1,500 mg ?150 mL/hr over  120 Minutes Intravenous Every 12 hours 08/30/21 1117 09/01/21 1620  ? 08/29/21 1400  ceFEPIme (MAXIPIME) 2 g in sodium chloride 0.9 % 100 mL IVPB  Status:  Discontinued       ? 2 g ?200 mL/hr over 30 Minutes Intravenous Every 8 hours 08/29/21 1000 08/29/21 1008  ? 08/29/21 1400  ceFEPIme (MAXIPIME) 2 g in sodium chloride 0.9 % 100 mL IVPB  Status:  Discontinued       ? 2 g ?200 mL/hr over 30 Minutes Intravenous Every 8 hours 08/29/21 1008 09/01/21 1620  ? 08/29/21 1000  vancomycin (VANCOREADY) IVPB 2000 mg/400 mL       ? 2,000 mg ?200 mL/hr over 120 Minutes Intravenous  Once 08/29/21 0959 08/29/21 1317  ? 08/29/21 0730  metroNIDAZOLE (FLAGYL) IVPB 500 mg  Status:  Discontinued       ? 500 mg ?100 mL/hr over 60 Minutes Intravenous Every 12 hours 08/29/21 0724 08/31/21 1335  ? ?  ? ? ?Assessment/Plan: ?s/p Procedure(s): ?LEFT 5TH RAY RESECTION (Left) ?Assessment: Stable status post ray resection left foot. ? ?Plan: Betadine and a sterile bandage reapplied to the left foot.  Patient instructed to keep this clean, dry, and  do not remove.  Patient should be stable for discharge.  Plan for follow-up outpatient next week. ? LOS: 4 days  ? ? ?Charles [name] ?09/02/2021 ? ?

## 2021-09-02 NOTE — Progress Notes (Signed)
Blood pressure 140/81, pulse 75, temperature 98.4 ?F (36.9 ?C), resp. rate 17, height '6\' 1"'$  (1.854 m), weight 106.6 kg, SpO2 94 %. ?IV removed site c/d/I discussed d/c packet with pt he was d/c with all belongings down to private car.  ?

## 2021-09-02 NOTE — Progress Notes (Signed)
? ?Date of Admission:  08/29/2021    ? ? ?ID: Charles [name] is a 61 y.o. male   ?Principal Problem: ?  Foot osteomyelitis, left ([place]) ?Active Problems: ?  Essential hypertension ?  CAD S/P percutaneous coronary angioplasty ?  Major depression, chronic ?  BPH (benign prostatic hyperplasia) ?  Hypothyroidism ?  Impaired fasting glucose ?  Hypokalemia ?  Obesity (BMI 30-39.9) ?  Chronic pain ? ? ? ?Subjective: ?Doing well ?No pain  ?Wants to go home ? ?Medications:  ? amLODipine  5 mg Oral Daily  ? vitamin C  500 mg Oral BID  ? aspirin EC  81 mg Oral Daily  ? atorvastatin  40 mg Oral QHS  ? buPROPion  150 mg Oral Daily  ? carvedilol  3.125 mg Oral BID WC  ? [START ON 09/03/2021] cholecalciferol  2,000 Units Oral Daily  ? DULoxetine  60 mg Oral Daily  ? enoxaparin (LOVENOX) injection  0.5 mg/kg Subcutaneous Q24H  ? levothyroxine  75 mcg Oral Q0600  ? lisdexamfetamine  40 mg Oral Daily  ? multivitamin with minerals  1 tablet Oral Daily  ? oxyCODONE  10 mg Oral 6 X Daily  ? Ensure Max Protein  11 oz Oral TID  ? sodium chloride flush  3 mL Intravenous Q12H  ? tamsulosin  0.4 mg Oral QHS  ? vitamin E  400 Units Oral Daily  ? [START ON 09/03/2021] zinc sulfate  220 mg Oral Daily  ? ? ?Objective: ?Vital signs in last 24 hours: ?Temp:  [97.9 ?F (36.6 ?C)-98.4 ?F (36.9 ?C)] 98.4 ?F (36.9 ?C) (05/16 1527) ?Pulse Rate:  [54-75] 75 (05/16 1527) ?Resp:  [16-18] 17 (05/16 1527) ?BP: (133-147)/(81-92) 140/81 (05/16 1527) ?SpO2:  [94 %-100 %] 94 % (05/16 1527) ? ?LDA ?none ? ?PHYSICAL EXAM:  ?General: Alert, cooperative, no distress, appears stated age.  ?Head: Normocephalic, without obvious abnormality, atraumatic. ?Eyes: Conjunctivae clear, anicteric sclerae. Pupils are equal ?ENT Nares normal. No drainage or sinus tenderness. ?Lips, mucosa, and tongue normal. No Thrush ?Neck: Supple, symmetrical, no adenopathy, thyroid: non tender ?no carotid bruit and no JVD. ?Back: No CVA tenderness. ?Lungs: Clear to auscultation bilaterally. No  Wheezing or Rhonchi. No rales. ?Heart: Regular rate and rhythm, no murmur, rub or gallop. ?Abdomen: Soft, non-tender,not distended. Bowel sounds normal. No masses ?Extremities: left foot ? ?Surgical site looks good ?Skin: No rashes or lesions. Or bruising ?Lymph: Cervical, supraclavicular normal. ?Neurologic: Grossly non-focal ? ?Lab Results ?Recent Labs  ?  08/31/21 ?0405 09/01/21 ?0346  ?WBC 5.4  --   ?HGB 10.4*  --   ?HCT 33.7*  --   ?NA 142  --   ?K 4.5  --   ?CL 107  --   ?CO2 25  --   ?BUN 18  --   ?CREATININE 0.93 0.85  ? ? ?Microbiology: ?enterococcus ? ? ?Assessment/Plan: ? ?Left foot ulcer.  Chronic leading to osteomyelitis of the fifth toe.  Status post partial ray excision.  The proximal margin has osteo.  But the wound is looking very good. ?Patient does not want to go on IV antibiotics.  He prefers to take oral.  So we will send him on 1 g amoxicillin p.o. 3 times a day for 4 weeks..  We will see him as outpatient. ? ?History of right TMA ? ?History of left great toe amputation ? ?Asked patient not to walk barefoot ?Also to cover the wound completely as he is got 2 dogs.  And  to avoid any contamination. ? ?CAD status post stent on atorvastatin and carvedilol ? ?Anemia of chronic disease ? ?History of gastric bypass with reversal of diabetes mellitus on losing 200 pounds ? ?Discussed the management with the care team. ?  ?

## 2021-09-02 NOTE — Plan of Care (Signed)
  Problem: Activity: Goal: Risk for activity intolerance will decrease Outcome: Progressing   Problem: Elimination: Goal: Will not experience complications related to bowel motility Outcome: Progressing Goal: Will not experience complications related to urinary retention Outcome: Progressing   Problem: Pain Managment: Goal: General experience of comfort will improve Outcome: Progressing   

## 2021-09-03 ENCOUNTER — Telehealth: Payer: Self-pay

## 2021-09-03 ENCOUNTER — Other Ambulatory Visit: Payer: Self-pay | Admitting: Physician Assistant

## 2021-09-03 DIAGNOSIS — G4719 Other hypersomnia: Secondary | ICD-10-CM

## 2021-09-03 LAB — CULTURE, BLOOD (ROUTINE X 2)
Culture: NO GROWTH
Culture: NO GROWTH
Special Requests: ADEQUATE
Special Requests: ADEQUATE

## 2021-09-03 MED ORDER — LISDEXAMFETAMINE DIMESYLATE 40 MG PO CAPS: 40.0000 mg | ORAL_CAPSULE | Freq: Every morning | ORAL | 0 refills | Status: DC

## 2021-09-04 ENCOUNTER — Encounter: Payer: Managed Care, Other (non HMO) | Admitting: Physician Assistant

## 2021-09-04 ENCOUNTER — Encounter: Payer: Self-pay | Admitting: Dietician

## 2021-09-04 DIAGNOSIS — I10 Essential (primary) hypertension: Secondary | ICD-10-CM | POA: Diagnosis not present

## 2021-09-04 DIAGNOSIS — E11621 Type 2 diabetes mellitus with foot ulcer: Secondary | ICD-10-CM | POA: Diagnosis not present

## 2021-09-04 DIAGNOSIS — L97512 Non-pressure chronic ulcer of other part of right foot with fat layer exposed: Secondary | ICD-10-CM | POA: Diagnosis not present

## 2021-09-04 DIAGNOSIS — I2510 Atherosclerotic heart disease of native coronary artery without angina pectoris: Secondary | ICD-10-CM | POA: Diagnosis not present

## 2021-09-04 DIAGNOSIS — L97522 Non-pressure chronic ulcer of other part of left foot with fat layer exposed: Secondary | ICD-10-CM | POA: Diagnosis not present

## 2021-09-04 DIAGNOSIS — M86672 Other chronic osteomyelitis, left ankle and foot: Secondary | ICD-10-CM | POA: Diagnosis not present

## 2021-09-04 LAB — AEROBIC/ANAEROBIC CULTURE W GRAM STAIN (SURGICAL/DEEP WOUND): Gram Stain: NONE SEEN

## 2021-09-04 LAB — VITAMIN B1: Vitamin B1 (Thiamine): 127.3 nmol/L (ref 66.5–200.0)

## 2021-09-04 LAB — VITAMIN A: Vitamin A (Retinoic Acid): 14.6 ug/dL — ABNORMAL LOW (ref 22.0–69.5)

## 2021-09-04 LAB — VITAMIN C: Vitamin C: 0.4 mg/dL (ref 0.4–2.0)

## 2021-09-04 NOTE — Progress Notes (Signed)
Nutrition Brief Note   Pt at high risk for nutrient deficiencies r/t his h/o gastric surgery. Recommend check thiamine, B12, folate, iron, calcium, zinc, copper and vitamins D, C, A, E & K yearly.   Pt had vitamin labs sent off while hospitalized. Labs were as follows:   Spoke with pt via phone. Notified pt of his lab results. Recommend:  Vitamin D- 2000 IU daily x 30 days Vitamin A- 10,000 IU daily x 30 days Vitamin- '500mg'$  BID x 30 days Vitamin E- 400 IU daily x 30 days Zinc- '50mg'$  (elemental) daily x 30 days Resume bariatric vitamin regimen at home (including MVI and calcium)  Recommend recheck all vitamin labs in 30-60 days  Koleen Distance MS, RD, LDN Please refer to [facility] for RD and/or RD on-call/weekend/after hours pager

## 2021-09-04 NOTE — Progress Notes (Addendum)
REBEL, WILLCUTT ([id number]) Visit Report for 09/04/2021 Chief Complaint Document Details Patient Name: Charles [name], Charles [name] Date of Service: 09/04/2021 11:00 AM Medical Record Number: [id number] Patient Account Number: [id number] Date of Birth/Sex: [birth year] (61 y.o. M) Treating RN: Carlene Coria Primary Care Provider: Drema Dallas Other Clinician: Referring Provider: Drema Dallas Treating Provider/Extender: Skipper Cliche in Treatment: 7 Information Obtained from: Patient Chief Complaint Left lateral foot and right plantar foot ulcer - NON-Workers Comp Ulcers Electronic Signature(s) Signed: 09/04/2021 11:30:52 AM By: Worthy Keeler PA-C Entered By: Worthy Keeler on 09/04/2021 11:30:51 Pelot, Josealberto Chauncey Cruel ([id number]) -------------------------------------------------------------------------------- Debridement Details Patient Name: [name] Date of Service: 09/04/2021 11:00 AM Medical Record Number: [id number] Patient Account Number: [id number] Date of Birth/Sex: January 05, 1961 (61 y.o. M) Treating RN: Carlene Coria Primary Care Provider: Drema Dallas Other Clinician: Referring Provider: Drema Dallas Treating Provider/Extender: Skipper Cliche in Treatment: 7 Debridement Performed for Wound #2 Right,Plantar Foot Assessment: Performed By: Physician Tommie Sams., PA-C Debridement Type: Debridement Severity of Tissue Pre Debridement: Fat layer exposed Level of Consciousness (Pre- Awake and Alert procedure): Pre-procedure Verification/Time Out Yes - 11:43 Taken: Start Time: 11:43 Total Area Debrided (L x W): 0.5 (cm) x 0.5 (cm) = 0.25 (cm) Tissue and other material Viable, Non-Viable, Callus, Slough, Subcutaneous, Skin: Dermis , Skin: Epidermis, Slough debrided: Level: Skin/Subcutaneous Tissue Debridement Description: Excisional Instrument: Curette Bleeding: Minimum Hemostasis Achieved: Pressure End Time: 11:46 Procedural Pain: 0 Post  Procedural Pain: 0 Response to Treatment: Procedure was tolerated well Level of Consciousness (Post- Awake and Alert procedure): Post Debridement Measurements of Total Wound Length: (cm) 0.3 Width: (cm) 0.5 Depth: (cm) 0.2 Volume: (cm) 0.024 Character of Wound/Ulcer Post Debridement: Improved Severity of Tissue Post Debridement: Fat layer exposed Post Procedure Diagnosis Same as Pre-procedure Electronic Signature(s) Signed: 09/04/2021 4:24:09 PM By: Carlene Coria RN Signed: 09/04/2021 5:51:16 PM By: Worthy Keeler PA-C Entered By: Carlene Coria on 09/04/2021 11:45:43 Izard, Khyran S. ([id number]) -------------------------------------------------------------------------------- HPI Details Patient Name: [name] Date of Service: 09/04/2021 11:00 AM Medical Record Number: [id number] Patient Account Number: [id number] Date of Birth/Sex: 1961/01/24 (61 y.o. M) Treating RN: Carlene Coria Primary Care Provider: Drema Dallas Other Clinician: Referring Provider: Drema Dallas Treating Provider/Extender: Skipper Cliche in Treatment: 7 History of Present Illness HPI Description: 5.7 diet controlled Workers Compensation Chart: 07/15/2021 upon evaluation today patient appears to be doing decently well in regard to a wound on the left foot first great toe amputation site. This is a wound that has been taken care of by Dr. Luana Shu up to this point at podiatry. He also has been seeing the patient for wounds that are non-Worker's Comp. related that will be dictated in a another note to this is actually the [employer] note currently. The patient sustained this injury when he was at work and apparently there was a machine that you stood beside that would come down to slice something that apparently his great toe got caught in the machine and removed a portion of this and ended up requiring a amputation of the digit completely. Subsequently this had for the most part healed but  there was a small dehiscence and its not closed completely. Again this is the [employer] portion of his issues going on at this point. The good news is there does not appear to be any signs of infection right now which is great news. The patient does have several medical problems noted below. The patient does have diabetes mellitus type 2, hypertension,  and coronary artery disease. Of note however his hemoglobin A1c is actually very good at 5.7 and this is diet controlled. Overall this should both well for healing with appropriate offloading which I think is going to be probably the primary focus of what we will be doing. The good news is he is also had x-rays which did not show any evidence of osteomyelitis he has not had an MRI since surgery. With regard to the non-Worker's Comp. wounds he has 1 on the fifth metatarsal lateral region of the left foot as well as the plantar aspect of the right foot. Again these are not Worker's Comp. related and are being treated separately. Nonetheless we were not able to document a separate note between the [employer] and the regular at this point and therefore were combining everything as I wanted to be documented and not left out. 07-24-2021 upon evaluation today patient appears to be doing a little better in regard to the wounds on the lateral aspect of his left foot as well as the right plantar foot. He is showing again some signs of increased granulation I am pleased in that regard. Fortunately I do not see any signs of active infection locally or systemically which is great news. No fevers, chills, nausea, vomiting, or diarrhea. I do think that potentially we may need to look into repeat evaluation possibly MRI of the left foot if things do not show signs of improvement previously in the area had a CT scan which showed no osteomyelitis but I believe that was January 2023. That could have changed in the interim and we need to keep a close eye on things  here. 07-31-2021 upon evaluation today patient appears to be doing well with regard to his wounds. He does have bone exposed on the left lateral foot although I could not identify any necrotic bone in particular. With that being said there is a lot of callus around the edges of the plantar foot right and I am going to perform some debridement on both locations. 08-07-2021 upon evaluation today patient appears to be doing well with regard to his wounds all things considered. The right plantar foot is doing better. The left lateral foot unfortunately still shows bone in the base of the wound. There is some concern for the possibility of osteomyelitis based on what we are seeing his previous CT scan did not show this but that was back in January 3 months past. At this point the wound does appear to be deeper with bone exposure and subsequently I do believe that there is a great risk year of there actually being some infection present. Subsequently we have attempted to order a repeat CT scan this has been denied by insurance I have no documentation as to why that may be. The patient has not received anything and this was the first he heard of it this morning we just found out yesterday. Nonetheless I am going to have him contact them he offered and stated that he would give them a call anyway I am definitely okay with that. Depending on what he can find out or if he can even get any information for me to get in touch with someone I will be happy to discuss the case with a peer to peer reviewer as well. 08-21-2021 upon evaluation today patient appears to be doing okay in regard to his wounds. He still has not found out any information regarding the CT scan with his insurance. We are going  to need to try to follow-up on this as honestly he really does need a CT scan to see what exactly is going on and to be honest I think that a peer to peer review would likely achieve the what we need as far as getting this  done. He did have a repeat x-ray with podiatry and apparently they saw some stuff that did make him concerned about the fact that he needs the CT scan as well they are unsure why he is being denied. I am in complete agreement as far as that is concerned. 08-28-2021 upon evaluation today patient unfortunately appears to be doing significantly worse in regard to his left leg. He actually has erythema all the way up from the foot to his knee. This is significantly swollen as well. Again we have been trying to get a CT scan for several weeks and insurance has denied this. Stating it was not medically necessary. With that being said I do believe it is and I do believe he likely has osteomyelitis this has been stated by both myself as well as his podiatrist. Nonetheless we have not been up to this point able to get approval to proceed with the imaging study. At this point I believe the patient is going require ER evaluation and likely admission to the hospital to get the infection under control this appears to be a quite significant infection of the left lower extremity extending again from the foot all the way up to the knee. 09-04-2021 upon evaluation today patient appears to be doing he tells me much better compared to last time I saw him. He subsequently went to the hospital after I saw him last he was having a lot of issues. We have been denied by insurance up to that point for obtaining the CT scan which I felt like was necessary in order to evaluate and confirm the extent and quantify the osteomyelitis that I felt was likely present this was true of the podiatrist as well but we were unable to obtain that approval. Subsequently when the patient came in he was looking significantly worse ended up sending him to the hospital for further evaluation and treatment. That was actually on 08-28-2021. Subsequently has been in the hospital until yesterday and he did have 1/5 digit and partial ray amputation which  was undertaken on 08-30-2021. This was due to osteomyelitis. I do have the results of the pathology as well which shows that the patient does have osteomyelitis which involves the bone margin. This means that Sherburne, Stefon S. ([id number]) he has not completely clear the osteomyelitis and for this reason he has been placed on oral antibiotics as well he is taking amoxicillin currently due to Enterococcus that is the pathologic organism that was noted on evaluation. With that being said this means that he is still at risk for this worsening and to be perfectly honest I do believe he is not only a candidate for HBO therapy but it would probably be a really good option for him to undertake HBO therapy in order to try and get this infection completely cleared so it does not continue to be an issue for him and end up with further amputation. The patient voiced understanding he is actually in agreement the plan. We just need to continue with the approval process for this. Electronic Signature(s) Signed: 09/04/2021 2:23:43 PM By: Worthy Keeler PA-C Entered By: Worthy Keeler on 09/04/2021 14:23:43 Hanway, Abdou SMarland Kitchen ([id number]) -------------------------------------------------------------------------------- Physical Exam Details  Patient Name: EXZAVIER, RUDERMAN Date of Service: 09/04/2021 11:00 AM Medical Record Number: [id number] Patient Account Number: [id number] Date of Birth/Sex: [birth year] (61 y.o. M) Treating RN: Carlene Coria Primary Care Provider: Drema Dallas Other Clinician: Referring Provider: Drema Dallas Treating Provider/Extender: Skipper Cliche in Treatment: 7 Constitutional patient is hypertensive.. pulse regular and within target range for patient.Marland Kitchen respirations regular, non-labored and within target range for patient.Marland Kitchen temperature within target range for patient.. Well-nourished and well-hydrated in no acute distress. Cardiovascular 2+ dorsalis pedis/posterior tibialis  pulses. no clubbing, cyanosis, significant edema, <3 sec cap refill. Psychiatric this patient is able to make decisions and demonstrates good insight into disease process. Alert and Oriented x 3. pleasant and cooperative. Notes Upon inspection patient's wounds on the left foot are not visualized today as he has a dressing on. In regard to his right foot this is actually significantly better probably due to the fact has been in the hospital and not up on it. To that end I told him that he needs to continue to stay off of it at home especially while he is out of work at this point. The patient voiced understanding. He tells me he is doing the best he has been in a long time right now I think if he wants to continue that way he needs to continue to take very good care of this and be diligent about staying off of this. He voiced understanding. Electronic Signature(s) Signed: 09/04/2021 5:37:50 PM By: Worthy Keeler PA-C Entered By: Worthy Keeler on 09/04/2021 17:37:50 [name] ([id number]) -------------------------------------------------------------------------------- Physician Orders Details Patient Name: [name] Date of Service: 09/04/2021 11:00 AM Medical Record Number: [id number] Patient Account Number: [id number] Date of Birth/Sex: [birth year] (61 y.o. M) Treating RN: Carlene Coria Primary Care Provider: Drema Dallas Other Clinician: Referring Provider: Drema Dallas Treating Provider/Extender: Skipper Cliche in Treatment: 7 Verbal / Phone Orders: No Diagnosis Coding Follow-up Appointments o Return Appointment in 1 week. Bathing/ Shower/ Hygiene o May shower; gently cleanse wound with antibacterial soap, rinse and pat dry prior to dressing wounds Edema Control - Lymphedema / Segmental Compressive Device / Other o Elevate, Exercise Daily and Avoid Standing for Long Periods of Time. o Elevate legs to the level of the heart and pump ankles as often  as possible o Elevate leg(s) parallel to the floor when sitting. Off-Loading o Open toe surgical shoe Hyperbaric Oxygen Therapy o Evaluate for HBO Therapy - Left foot chronic osteomyelitis confirmed still involving the margin following partial 5th ray amputation o Indication and location: - osteomyelitis left 5th metatarsal amputation site o If appropriate for treatment, begin HBOT per protocol: o 2.0 ATA for 90 Minutes without Air Breaks o One treatment per day (delivered Monday through Friday unless otherwise specified in Special Instructions below): o Total # of Treatments: - 40 o Antihistamine 30 minutes prior to HBO Treatment, difficulty clearing ears. o Finger stick Blood Glucose Pre- and Post- HBOT Treatment. o Follow Hyperbaric Oxygen Glycemia Protocol Wound Treatment Wound #2 - Foot Wound Laterality: Plantar, Right Primary Dressing: Hydrofera Blue Ready Transfer Foam, 2.5x2.5 (in/in) Every Other Day/30 Days Discharge Instructions: Apply Hydrofera Blue Ready to wound bed as directed Secondary Dressing: Gauze Every Other Day/30 Days Discharge Instructions: As directed: dry, moistened with saline or moistened with Dakins Solution Secured With: Coban Cohesive Bandage 4x5 (yds) Stretched Every Other Day/30 Days Discharge Instructions: Apply Coban as directed. GLYCEMIA INTERVENTIONS PROTOCOL PRE-HBO GLYCEMIA INTERVENTIONS ACTION INTERVENTION Obtain pre-HBO capillary blood glucose (ensure  1 physician order is in chart). A. Notify HBO physician and await physician orders. 2 If result is 70 mg/dl or below: B. If the result meets the hospital definition of a critical result, follow hospital policy. A. Give patient an 8 ounce Glucerna Shake, an 8 ounce Ensure, or 8 ounces of a Glucerna/Ensure equivalent dietary supplement*. B. Wait 30 minutes. If result is 71 mg/dl to 130 mg/dl: C. Retest patientos capillary blood glucose (CBG). D. If result greater  than or equal to 110 mg/dl, proceed with HBO. If result less than 110 mg/dl, notify HBO physician and consider holding HBO. If result is 131 mg/dl to 249 mg/dl: A. Proceed with HBO. Car, Jamaul S. ([id number]) If result is 250 mg/dl or greater: A. Notify HBO physician and await physician orders. B. It is recommended to hold HBO and do blood/urine ketone testing. C. If the result meets the hospital definition of a critical result, follow hospital policy. POST-HBO GLYCEMIA INTERVENTIONS ACTION INTERVENTION Obtain post HBO capillary blood glucose (ensure 1 physician order is in chart). A. Notify HBO physician and await physician orders. 2 If result is 70 mg/dl or below: B. If the result meets the hospital definition of a critical result, follow hospital policy. A. Give patient an 8 ounce Glucerna Shake, an 8 ounce Ensure, or 8 ounces of a Glucerna/Ensure equivalent dietary supplement*. B. Wait 15 minutes for symptoms of hypoglycemia (i.e. nervousness, anxiety, If result is 71 mg/dl to 100 mg/dl: sweating, chills, clamminess, irritability, confusion, tachycardia or dizziness). C. If patient asymptomatic, discharge patient. If patient symptomatic, repeat capillary blood glucose (CBG) and notify HBO physician. If result is 101 mg/dl to 249 mg/dl: A. Discharge patient. A. Notify HBO physician and await physician orders. B. It is recommended to do blood/urine If result is 250 mg/dl or greater: ketone testing. C. If the result meets the hospital definition of a critical result, follow hospital policy. *Juice or candies are NOT equivalent products. If patient refuses the Glucerna or Ensure, please consult the hospital dietitian for an appropriate substitute. Notes wounds 3 and 4 not assessed due to recent amputation Electronic Signature(s) Signed: 09/04/2021 5:51:16 PM By: Worthy Keeler PA-C Previous Signature: 09/04/2021 4:24:09 PM Version By: Carlene Coria RN Entered  By: Worthy Keeler on 09/04/2021 17:40:56 Rossie, Marquavious Chauncey Cruel ([id number]) -------------------------------------------------------------------------------- Problem List Details Patient Name: [name] Date of Service: 09/04/2021 11:00 AM Medical Record Number: [id number] Patient Account Number: [id number] Date of Birth/Sex: 07/08/1960 (61 y.o. M) Treating RN: Carlene Coria Primary Care Provider: Drema Dallas Other Clinician: Referring Provider: Drema Dallas Treating Provider/Extender: Skipper Cliche in Treatment: 7 Active Problems ICD-10 Encounter Code Description Active Date MDM Diagnosis E11.621 Type 2 diabetes mellitus with foot ulcer 07/15/2021 No Yes M86.672 Other chronic osteomyelitis, left ankle and foot 09/04/2021 No Yes L97.522 Non-pressure chronic ulcer of other part of left foot with fat layer 07/24/2021 No Yes exposed L97.512 Non-pressure chronic ulcer of other part of right foot with fat layer 07/24/2021 No Yes exposed [place] (primary) hypertension 07/15/2021 No Yes I25.10 Atherosclerotic heart disease of native coronary artery without angina 07/15/2021 No Yes pectoris Inactive Problems Resolved Problems Electronic Signature(s) Signed: 09/04/2021 5:39:02 PM By: Worthy Keeler PA-C Previous Signature: 09/04/2021 11:30:48 AM Version By: Worthy Keeler PA-C Entered By: Worthy Keeler on 09/04/2021 17:39:01 Rougeau, Najib S. ([id number]) -------------------------------------------------------------------------------- Progress Note Details Patient Name: [name] Date of Service: 09/04/2021 11:00 AM Medical Record Number: [id number] Patient Account Number: [id number] Date of Birth/Sex: [birth year] (  61 y.o. M) Treating RN: Carlene Coria Primary Care Provider: Drema Dallas Other Clinician: Referring Provider: Drema Dallas Treating Provider/Extender: Skipper Cliche in Treatment: 7 Subjective Chief Complaint Information obtained from  Patient Left lateral foot and right plantar foot ulcer - NON-Workers Comp Ulcers History of Present Illness (HPI) 5.7 diet controlled Workers Compensation Chart: 07/15/2021 upon evaluation today patient appears to be doing decently well in regard to a wound on the left foot first great toe amputation site. This is a wound that has been taken care of by Dr. Luana Shu up to this point at podiatry. He also has been seeing the patient for wounds that are non-Worker's Comp. related that will be dictated in a another note to this is actually the [employer] note currently. The patient sustained this injury when he was at work and apparently there was a machine that you stood beside that would come down to slice something that apparently his great toe got caught in the machine and removed a portion of this and ended up requiring a amputation of the digit completely. Subsequently this had for the most part healed but there was a small dehiscence and its not closed completely. Again this is the [employer] portion of his issues going on at this point. The good news is there does not appear to be any signs of infection right now which is great news. The patient does have several medical problems noted below. The patient does have diabetes mellitus type 2, hypertension, and coronary artery disease. Of note however his hemoglobin A1c is actually very good at 5.7 and this is diet controlled. Overall this should both well for healing with appropriate offloading which I think is going to be probably the primary focus of what we will be doing. The good news is he is also had x-rays which did not show any evidence of osteomyelitis he has not had an MRI since surgery. With regard to the non-Worker's Comp. wounds he has 1 on the fifth metatarsal lateral region of the left foot as well as the plantar aspect of the right foot. Again these are not Worker's Comp. related and are being treated separately. Nonetheless we  were not able to document a separate note between the [employer] and the regular at this point and therefore were combining everything as I wanted to be documented and not left out. 07-24-2021 upon evaluation today patient appears to be doing a little better in regard to the wounds on the lateral aspect of his left foot as well as the right plantar foot. He is showing again some signs of increased granulation I am pleased in that regard. Fortunately I do not see any signs of active infection locally or systemically which is great news. No fevers, chills, nausea, vomiting, or diarrhea. I do think that potentially we may need to look into repeat evaluation possibly MRI of the left foot if things do not show signs of improvement previously in the area had a CT scan which showed no osteomyelitis but I believe that was January 2023. That could have changed in the interim and we need to keep a close eye on things here. 07-31-2021 upon evaluation today patient appears to be doing well with regard to his wounds. He does have bone exposed on the left lateral foot although I could not identify any necrotic bone in particular. With that being said there is a lot of callus around the edges of the plantar foot right and I am going  to perform some debridement on both locations. 08-07-2021 upon evaluation today patient appears to be doing well with regard to his wounds all things considered. The right plantar foot is doing better. The left lateral foot unfortunately still shows bone in the base of the wound. There is some concern for the possibility of osteomyelitis based on what we are seeing his previous CT scan did not show this but that was back in January 3 months past. At this point the wound does appear to be deeper with bone exposure and subsequently I do believe that there is a great risk year of there actually being some infection present. Subsequently we have attempted to order a repeat CT scan this has  been denied by insurance I have no documentation as to why that may be. The patient has not received anything and this was the first he heard of it this morning we just found out yesterday. Nonetheless I am going to have him contact them he offered and stated that he would give them a call anyway I am definitely okay with that. Depending on what he can find out or if he can even get any information for me to get in touch with someone I will be happy to discuss the case with a peer to peer reviewer as well. 08-21-2021 upon evaluation today patient appears to be doing okay in regard to his wounds. He still has not found out any information regarding the CT scan with his insurance. We are going to need to try to follow-up on this as honestly he really does need a CT scan to see what exactly is going on and to be honest I think that a peer to peer review would likely achieve the what we need as far as getting this done. He did have a repeat x-ray with podiatry and apparently they saw some stuff that did make him concerned about the fact that he needs the CT scan as well they are unsure why he is being denied. I am in complete agreement as far as that is concerned. 08-28-2021 upon evaluation today patient unfortunately appears to be doing significantly worse in regard to his left leg. He actually has erythema all the way up from the foot to his knee. This is significantly swollen as well. Again we have been trying to get a CT scan for several weeks and insurance has denied this. Stating it was not medically necessary. With that being said I do believe it is and I do believe he likely has osteomyelitis this has been stated by both myself as well as his podiatrist. Nonetheless we have not been up to this point able to get approval to proceed with the imaging study. At this point I believe the patient is going require ER evaluation and likely admission to the hospital to get the infection under control this  appears to be a quite significant infection of the left lower extremity extending again from the foot all the way up to the knee. 09-04-2021 upon evaluation today patient appears to be doing he tells me much better compared to last time I saw him. He subsequently went to the hospital after I saw him last he was having a lot of issues. We have been denied by insurance up to that point for obtaining the CT scan which Acebo, Charels S. ([id number]) I felt like was necessary in order to evaluate and confirm the extent and quantify the osteomyelitis that I felt was likely present this was  true of the podiatrist as well but we were unable to obtain that approval. Subsequently when the patient came in he was looking significantly worse ended up sending him to the hospital for further evaluation and treatment. That was actually on 08-28-2021. Subsequently has been in the hospital until yesterday and he did have 1/5 digit and partial ray amputation which was undertaken on 08-30-2021. This was due to osteomyelitis. I do have the results of the pathology as well which shows that the patient does have osteomyelitis which involves the bone margin. This means that he has not completely clear the osteomyelitis and for this reason he has been placed on oral antibiotics as well he is taking amoxicillin currently due to Enterococcus that is the pathologic organism that was noted on evaluation. With that being said this means that he is still at risk for this worsening and to be perfectly honest I do believe he is not only a candidate for HBO therapy but it would probably be a really good option for him to undertake HBO therapy in order to try and get this infection completely cleared so it does not continue to be an issue for him and end up with further amputation. The patient voiced understanding he is actually in agreement the plan. We just need to continue with the approval process  for this. Objective Constitutional patient is hypertensive.. pulse regular and within target range for patient.Marland Kitchen respirations regular, non-labored and within target range for patient.Marland Kitchen temperature within target range for patient.. Well-nourished and well-hydrated in no acute distress. Vitals Time Taken: 11:18 AM, Weight: 230 lbs, Temperature: 98.3 F, Pulse: 69 bpm, Respiratory Rate: 18 breaths/min, Blood Pressure: 151/91 mmHg. Cardiovascular 2+ dorsalis pedis/posterior tibialis pulses. no clubbing, cyanosis, significant edema, Psychiatric this patient is able to make decisions and demonstrates good insight into disease process. Alert and Oriented x 3. pleasant and cooperative. General Notes: Upon inspection patient's wounds on the left foot are not visualized today as he has a dressing on. In regard to his right foot this is actually significantly better probably due to the fact has been in the hospital and not up on it. To that end I told him that he needs to continue to stay off of it at home especially while he is out of work at this point. The patient voiced understanding. He tells me he is doing the best he has been in a long time right now I think if he wants to continue that way he needs to continue to take very good care of this and be diligent about staying off of this. He voiced understanding. Integumentary (Hair, Skin) Wound #2 status is Open. Original cause of wound was Gradually Appeared. The date acquired was: 04/20/2020. The wound has been in treatment 7 weeks. The wound is located on the [place]. The wound measures 0.3cm length x 0.5cm width x 0.2cm depth; 0.118cm^2 area and 0.024cm^3 volume. There is Fat Layer (Subcutaneous Tissue) exposed. There is no tunneling or undermining noted. There is a medium amount of serosanguineous drainage noted. There is large (67-100%) red, pink granulation within the wound bed. There is a small (1-33%) amount of necrotic tissue within  the wound bed. Assessment Active Problems ICD-10 Type 2 diabetes mellitus with foot ulcer Other chronic osteomyelitis, left ankle and foot Non-pressure chronic ulcer of other part of left foot with fat layer exposed Non-pressure chronic ulcer of other part of right foot with fat layer exposed Essential (primary) hypertension Atherosclerotic heart disease of native  coronary artery without angina pectoris Procedures Wound #2 Pre-procedure diagnosis of Wound #2 is a Diabetic Wound/Ulcer of the Lower Extremity located on the Right,Plantar Foot .Severity of Tissue Pre Debridement is: Fat layer exposed. There was a Excisional Skin/Subcutaneous Tissue Debridement with a total area of 0.25 sq cm performed by Tommie Sams., PA-C. With the following instrument(s): Curette to remove Viable and Non-Viable tissue/material. Material removed includes Cammack, Gavin S. ([id number]) Callus, Subcutaneous Tissue, Slough, Skin: Dermis, and Skin: Epidermis. No specimens were taken. A time out was conducted at 11:43, prior to the start of the procedure. A Minimum amount of bleeding was controlled with Pressure. The procedure was tolerated well with a pain level of 0 throughout and a pain level of 0 following the procedure. Post Debridement Measurements: 0.3cm length x 0.5cm width x 0.2cm depth; 0.024cm^3 volume. Character of Wound/Ulcer Post Debridement is improved. Severity of Tissue Post Debridement is: Fat layer exposed. Post procedure Diagnosis Wound #2: Same as Pre-Procedure Plan Follow-up Appointments: Return Appointment in 1 week. Bathing/ Shower/ Hygiene: May shower; gently cleanse wound with antibacterial soap, rinse and pat dry prior to dressing wounds Edema Control - Lymphedema / Segmental Compressive Device / Other: Elevate, Exercise Daily and Avoid Standing for Long Periods of Time. Elevate legs to the level of the heart and pump ankles as often as possible Elevate leg(s) parallel to the floor  when sitting. Off-Loading: Open toe surgical shoe Hyperbaric Oxygen Therapy: Evaluate for HBO Therapy - Left foot chronic osteomyelitis confirmed still involving the margin following partial 5th ray amputation Indication and location: - osteomyelitis left 5th metatarsal amputation site If appropriate for treatment, begin HBOT per protocol: 2.0 ATA for 90 Minutes without Air Breaks One treatment per day (delivered Monday through Friday unless otherwise specified in Special Instructions below): Total # of Treatments: - 40 Antihistamine 30 minutes prior to HBO Treatment, difficulty clearing ears. Finger stick Blood Glucose Pre- and Post- HBOT Treatment. Follow Hyperbaric Oxygen Glycemia Protocol General Notes: wounds 3 and 4 not assessed due to recent amputation WOUND #2: - Foot Wound Laterality: Plantar, Right Primary Dressing: Hydrofera Blue Ready Transfer Foam, 2.5x2.5 (in/in) Every Other Day/30 Days Discharge Instructions: Apply Hydrofera Blue Ready to wound bed as directed Secondary Dressing: Gauze Every Other Day/30 Days Discharge Instructions: As directed: dry, moistened with saline or moistened with Dakins Solution Secured With: Coban Cohesive Bandage 4x5 (yds) Stretched Every Other Day/30 Days Discharge Instructions: Apply Coban as directed. 1. I am would recommend currently that we go ahead and continue with the wound care measures as before as far as offloading with postop shoes. 2. Also can recommend Hydrofera Blue for the right foot. 3. For the left foot this again was wrapped and he will be seeing the surgeon first of next week. In the meantime I do believe that the patient would be an excellent candidate for hyperbaric oxygen therapy. I do believe that his wound indicates that on the pathology report there is still osteomyelitis at the margin and again this indicates that he would still again benefit from HBO therapy in my opinion. I think that we need to see about approval  for HBO therapy and the sooner we get this done the better in my opinion. I do think that we can dive him at 2 ATA which should be sufficient without air breaks. The biggest hurdle right now is going to be insurance approval. We will see patient back for reevaluation in 1 week here in the clinic. If anything worsens  or changes patient will contact our office for additional recommendations. Electronic Signature(s) Signed: 09/04/2021 5:41:30 PM By: Worthy Keeler PA-C Entered By: Worthy Keeler on 09/04/2021 17:41:29 Eriksson, Montrel Chauncey Cruel ([id number]) -------------------------------------------------------------------------------- SuperBill Details Patient Name: [name] Date of Service: 09/04/2021 Medical Record Number: [id number] Patient Account Number: [id number] Date of Birth/Sex: Dec 09, 1960 (61 y.o. M) Treating RN: Carlene Coria Primary Care Provider: Drema Dallas Other Clinician: Referring Provider: Drema Dallas Treating Provider/Extender: Skipper Cliche in Treatment: 7 Diagnosis Coding ICD-10 Codes Code Description E11.621 Type 2 diabetes mellitus with foot ulcer M86.672 Other chronic osteomyelitis, left ankle and foot L97.522 Non-pressure chronic ulcer of other part of left foot with fat layer exposed L97.512 Non-pressure chronic ulcer of other part of right foot with fat layer exposed I10 Essential (primary) hypertension I25.10 Atherosclerotic heart disease of native coronary artery without angina pectoris Facility Procedures CPT4 Code: 94709628 Description: 36629 - DEB SUBQ TISSUE 20 SQ CM/< Modifier: Quantity: 1 CPT4 Code: Description: ICD-10 Diagnosis Description L97.512 Non-pressure chronic ulcer of other part of right foot with fat layer ex Modifier: posed Quantity: Physician Procedures CPT4 Code: 4765465 Description: 99214 - WC PHYS LEVEL 4 - EST PT Modifier: 25 Quantity: 1 CPT4 Code: Description: ICD-10 Diagnosis Description E11.621 Type 2  diabetes mellitus with foot ulcer M86.672 Other chronic osteomyelitis, left ankle and foot L97.522 Non-pressure chronic ulcer of other part of left foot with fat layer exp L97.512 Non-pressure  chronic ulcer of other part of right foot with fat layer ex Modifier: osed posed Quantity: CPT4 Code: 0354656 Description: 11042 - WC PHYS SUBQ TISS 20 SQ CM Modifier: Quantity: 1 CPT4 Code: Description: ICD-10 Diagnosis Description L97.512 Non-pressure chronic ulcer of other part of right foot with fat layer ex Modifier: posed Quantity: Electronic Signature(s) Signed: 09/04/2021 5:42:03 PM By: Worthy Keeler PA-C Entered By: Worthy Keeler on 09/04/2021 17:42:02

## 2021-09-05 NOTE — Progress Notes (Signed)
MARCELINO, CAMPOS ([id number]) Visit Report for 09/04/2021 HBO Patient Questionnaire Details Patient Name: AARIZ, MAISH Date of Service: 09/04/2021 11:00 AM Medical Record Number: [id number] Patient Account Number: [id number] Date of Birth/Sex: [birth year] (61 y.o. M) Treating RN: Carlene Coria Primary Care Frida Wahlstrom: Drema Dallas Other Clinician: Referring Alicya Bena: Drema Dallas Treating Loreli Debruler/Extender: Skipper Cliche in Treatment: 7 HBO Patient Questionnaire Items Answer Any "Yes" answers must be brought to the hyperbaric physician's attention. Breathing or Lung problemso No Currently use tobacco productso No Used tobacco products in the pasto No Heart problemso Yes Seen a doctor for any heart or blood pressure problemso Yes If yes, provide the name of doctor: Dr Rose Phi Do you take water pills (diuretic)o No Diabeteso Yes On Diabetes pillo No On Insulino No Dialysiso No Eye problems like glaucomao No Ear problems or surgeryo No Sinus Problemso No Cancero No Confinement Anxiety (Claustrophobia- fear of confined places)o No Any medical implants/devices that are fully or partially implanted or attached to your bodyo Yes Pregnanto No Seizureso No Notes Press photographer Signature(s) Signed: 09/04/2021 4:24:09 PM By: Carlene Coria RN Entered By: Carlene Coria on 09/04/2021 11:51:18

## 2021-09-10 ENCOUNTER — Telehealth: Payer: Self-pay

## 2021-09-10 ENCOUNTER — Other Ambulatory Visit: Payer: Self-pay | Admitting: Nurse Practitioner

## 2021-09-10 DIAGNOSIS — F5101 Primary insomnia: Secondary | ICD-10-CM

## 2021-09-10 NOTE — Telephone Encounter (Signed)
Pt aware we send med

## 2021-09-11 ENCOUNTER — Encounter: Payer: Managed Care, Other (non HMO) | Admitting: Physician Assistant

## 2021-09-12 ENCOUNTER — Ambulatory Visit (INDEPENDENT_AMBULATORY_CARE_PROVIDER_SITE_OTHER): Payer: Managed Care, Other (non HMO) | Admitting: Physician Assistant

## 2021-09-12 ENCOUNTER — Encounter: Payer: Self-pay | Admitting: Physician Assistant

## 2021-09-12 DIAGNOSIS — M7989 Other specified soft tissue disorders: Secondary | ICD-10-CM

## 2021-09-12 DIAGNOSIS — G894 Chronic pain syndrome: Secondary | ICD-10-CM | POA: Diagnosis not present

## 2021-09-12 DIAGNOSIS — G4719 Other hypersomnia: Secondary | ICD-10-CM

## 2021-09-12 DIAGNOSIS — S98132A Complete traumatic amputation of one left lesser toe, initial encounter: Secondary | ICD-10-CM

## 2021-09-12 DIAGNOSIS — L97519 Non-pressure chronic ulcer of other part of right foot with unspecified severity: Secondary | ICD-10-CM

## 2021-09-12 DIAGNOSIS — I10 Essential (primary) hypertension: Secondary | ICD-10-CM | POA: Diagnosis not present

## 2021-09-12 DIAGNOSIS — L97529 Non-pressure chronic ulcer of other part of left foot with unspecified severity: Secondary | ICD-10-CM

## 2021-09-12 MED ORDER — FUROSEMIDE 20 MG PO TABS: ORAL_TABLET | ORAL | 0 refills | Status: DC
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
## 2021-09-16 ENCOUNTER — Encounter: Payer: Managed Care, Other (non HMO) | Admitting: Physician Assistant

## 2021-09-16 ENCOUNTER — Telehealth: Payer: Self-pay

## 2021-09-16 DIAGNOSIS — E11621 Type 2 diabetes mellitus with foot ulcer: Secondary | ICD-10-CM | POA: Diagnosis not present

## 2021-09-16 LAB — GLUCOSE, CAPILLARY
Glucose-Capillary: 113 mg/dL — ABNORMAL HIGH (ref 70–99)
Glucose-Capillary: 91 mg/dL (ref 70–99)

## 2021-09-16 NOTE — Progress Notes (Signed)
Charles [name], Charles [name] ([id number]) Visit Report for 09/16/2021 Problem List Details Patient Name: [name], [name] Date of Service: 09/16/2021 10:00 AM Medical Record Number: [id number] Patient Account Number: [id number] Date of Birth/Sex: February 15, 1961 (62 y.o. M) Treating RN: Primary Care Provider: Drema Dallas Other Clinician: Referring Provider: Drema Dallas Treating Provider/Extender: Skipper Cliche in Treatment: 9 Active Problems ICD-10 Encounter Code Description Active Date MDM Diagnosis E11.621 Type 2 diabetes mellitus with foot ulcer 07/15/2021 No Yes M86.672 Other chronic osteomyelitis, left ankle and foot 09/04/2021 No Yes L97.522 Non-pressure chronic ulcer of other part of left foot with fat layer 07/24/2021 No Yes exposed L97.512 Non-pressure chronic ulcer of other part of right foot with fat layer 07/24/2021 No Yes exposed [place] (primary) hypertension 07/15/2021 No Yes I25.10 Atherosclerotic heart disease of native coronary artery without angina 07/15/2021 No Yes pectoris Inactive Problems Resolved Problems Electronic Signature(s) Signed: 09/16/2021 5:17:40 PM By: Worthy Keeler PA-C Entered By: Worthy Keeler on 09/16/2021 17:17:40 , Charles [name] ([id number]) -------------------------------------------------------------------------------- SuperBill Details Patient Name: Charles [name] Date of Service: 09/16/2021 Medical Record Number: [id number] Patient Account Number: [id number] Date of Birth/Sex: March 18, 1961 (61 y.o. M) Treating RN: Primary Care Provider: Drema Dallas Other Clinician: Referring Provider: Drema Dallas Treating Provider/Extender: Skipper Cliche in Treatment: 9 Diagnosis Coding ICD-10 Codes Code Description E11.621 Type 2 diabetes mellitus with foot ulcer M86.672 Other chronic osteomyelitis, left ankle and foot L97.522 Non-pressure chronic ulcer of other part of left foot with fat layer exposed L97.512 Non-pressure  chronic ulcer of other part of right foot with fat layer exposed I10 Essential (primary) hypertension I25.10 Atherosclerotic heart disease of native coronary artery without angina pectoris Facility Procedures CPT4 Code: 83382505 Description: (Facility Use Only) HBOT, full body chamber, 26mn Modifier: Quantity: 4 Physician Procedures CPT4 Code: 63976734Description: [id number] PHYS HYPERBARIC OXYGEN THERAPY Modifier: Quantity: 1 CPT4 Code: Description: ICD-10 Diagnosis Description M86.672 Other chronic osteomyelitis, left ankle and foot E11.621 Type 2 diabetes mellitus with foot ulcer L97.522 Non-pressure chronic ulcer of other part of left foot with fat layer expos Modifier: ed Quantity: Electronic Signature(s) Signed: 09/16/2021 5:18:23 PM By: SWorthy KeelerPA-C Previous Signature: 09/16/2021 1:59:04 PM Version By: WEnedina FinnerRCP, RRT, CHT Entered By: SWorthy Keeleron 09/16/2021 17:18:23

## 2021-09-16 NOTE — Telephone Encounter (Signed)
Charles [name] [id number]  paper work with note

## 2021-09-16 NOTE — Progress Notes (Addendum)
LOVELLE, LEMA ([id number]) Visit Report for 09/16/2021 Arrival Information Details Patient Name: Charles [name], Charles [name] Date of Service: 09/16/2021 9:00 AM Medical Record Number: [id number] Patient Account Number: [id number] Date of Birth/Sex: Sep 10, 1960 (62 y.o. M) Treating RN: Cornell Barman Primary Care Theodoros Stjames: Drema Dallas Other Clinician: Massie Kluver Referring Jamerius Boeckman: Drema Dallas Treating Merland Holness/Extender: Skipper Cliche in Treatment: 9 Visit Information History Since Last Visit Added or deleted any medications: No Patient Arrived: Ambulatory Any new allergies or adverse reactions: No Arrival Time: 09:33 Had a fall or experienced change in No Transfer Assistance: None activities of daily living that may affect Patient Requires Transmission-Based Precautions: No risk of falls: Patient Has Alerts: Yes Signs or symptoms of abuse/neglect since last visito No Patient Alerts: DIABETIC Hospitalized since last visit: No Pain Present Now: No Electronic Signature(s) Signed: 09/16/2021 2:38:02 PM By: Massie Kluver Entered By: Massie Kluver on 09/16/2021 09:33:51 Cantin, Alrick Chauncey Cruel ([id number]) -------------------------------------------------------------------------------- Clinic Level of Care Assessment Details Patient Name: [name] Date of Service: 09/16/2021 9:00 AM Medical Record Number: [id number] Patient Account Number: [id number] Date of Birth/Sex: 30-Mar-1961 (61 y.o. M) Treating RN: Cornell Barman Primary Care Epiphany Seltzer: Drema Dallas Other Clinician: Massie Kluver Referring Gwendelyn Lanting: Drema Dallas Treating Coleen Cardiff/Extender: Skipper Cliche in Treatment: 9 Clinic Level of Care Assessment Items TOOL 1 Quantity Score '[]'$  - Use when EandM and Procedure is performed on INITIAL visit 0 ASSESSMENTS - Nursing Assessment / Reassessment '[]'$  - General Physical Exam (combine w/ comprehensive assessment (listed just below) when performed on new 0 pt.  evals) '[]'$  - 0 Comprehensive Assessment (HX, ROS, Risk Assessments, Wounds Hx, etc.) ASSESSMENTS - Wound and Skin Assessment / Reassessment '[]'$  - Dermatologic / Skin Assessment (not related to wound area) 0 ASSESSMENTS - Ostomy and/or Continence Assessment and Care '[]'$  - Incontinence Assessment and Management 0 '[]'$  - 0 Ostomy Care Assessment and Management (repouching, etc.) PROCESS - Coordination of Care '[]'$  - Simple Patient / Family Education for ongoing care 0 '[]'$  - 0 Complex (extensive) Patient / Family Education for ongoing care '[]'$  - 0 Staff obtains Programmer, systems, Records, Test Results / Process Orders '[]'$  - 0 Staff telephones HHA, Nursing Homes / Clarify orders / etc '[]'$  - 0 Routine Transfer to another Facility (non-emergent condition) '[]'$  - 0 Routine Hospital Admission (non-emergent condition) '[]'$  - 0 New Admissions / Biomedical engineer / Ordering NPWT, Apligraf, etc. '[]'$  - 0 Emergency Hospital Admission (emergent condition) PROCESS - Special Needs '[]'$  - Pediatric / Minor Patient Management 0 '[]'$  - 0 Isolation Patient Management '[]'$  - 0 Hearing / Language / Visual special needs '[]'$  - 0 Assessment of Community assistance (transportation, D/C planning, etc.) '[]'$  - 0 Additional assistance / Altered mentation '[]'$  - 0 Support Surface(s) Assessment (bed, cushion, seat, etc.) INTERVENTIONS - Miscellaneous '[]'$  - External ear exam 0 '[]'$  - 0 Patient Transfer (multiple staff / Civil Service fast streamer / Similar devices) '[]'$  - 0 Simple Staple / Suture removal (25 or less) '[]'$  - 0 Complex Staple / Suture removal (26 or more) '[]'$  - 0 Hypo/Hyperglycemic Management (do not check if billed separately) '[]'$  - 0 Ankle / Brachial Index (ABI) - do not check if billed separately Has the patient been seen at the hospital within the last three years: Yes Total Score: 0 Level Of Care: ____ [name] ([id number]) Electronic Signature(s) Signed: 09/16/2021 2:38:02 PM By: Massie Kluver Entered By: Massie Kluver on 09/16/2021 10:20:49 Blyth, Sem S. ([id number]) -------------------------------------------------------------------------------- Encounter Discharge Information Details Patient Name: [name] Date of Service:  09/16/2021 9:00 AM Medical Record Number: [id number] Patient Account Number: [id number] Date of Birth/Sex: 09-12-1960 (61 y.o. M) Treating RN: Cornell Barman Primary Care Joee Iovine: Drema Dallas Other Clinician: Massie Kluver Referring Olena Willy: Drema Dallas Treating Cyann Venti/Extender: Skipper Cliche in Treatment: 9 Encounter Discharge Information Items Post Procedure Vitals Discharge Condition: Stable Temperature (F): 98.1 Ambulatory Status: Ambulatory Pulse (bpm): 66 Discharge Destination: Home Respiratory Rate (breaths/min): 16 Transportation: Private Auto Blood Pressure (mmHg): 152/89 Accompanied By: self Schedule Follow-up Appointment: Yes Clinical Summary of Care: Electronic Signature(s) Signed: 09/16/2021 2:38:02 PM By: Massie Kluver Entered By: Massie Kluver on 09/16/2021 10:23:45 Woolman, Deigo Chauncey Cruel ([id number]) -------------------------------------------------------------------------------- Lower Extremity Assessment Details Patient Name: [name] Date of Service: 09/16/2021 9:00 AM Medical Record Number: [id number] Patient Account Number: [id number] Date of Birth/Sex: September 30, 1960 (61 y.o. M) Treating RN: Cornell Barman Primary Care Darlena Koval: Drema Dallas Other Clinician: Massie Kluver Referring Lataya Varnell: Drema Dallas Treating Jaxen Samples/Extender: Skipper Cliche in Treatment: 9 Edema Assessment Assessed: Shirlyn Goltz: Yes] Patrice Paradise: Yes] Edema: [Left: Yes] [Right: Yes] Calf Left: Right: Point of Measurement: From Medial Instep 42.1 cm 37.4 cm Ankle Left: Right: Point of Measurement: From Medial Instep 29.6 cm 25.4 cm Vascular Assessment Pulses: Dorsalis Pedis Palpable: [Left:Yes] [Right:Yes] Posterior  Tibial Palpable: [Right:Yes] Electronic Signature(s) Signed: 09/16/2021 1:58:17 PM By: Gretta Cool, BSN, RN, CWS, Kim RN, BSN Signed: 09/16/2021 2:38:02 PM By: Massie Kluver Entered By: Massie Kluver on 09/16/2021 09:46:30 Cantu, Montrez S. ([id number]) -------------------------------------------------------------------------------- Multi Wound Chart Details Patient Name: [name] Date of Service: 09/16/2021 9:00 AM Medical Record Number: [id number] Patient Account Number: [id number] Date of Birth/Sex: [birth year] (61 y.o. 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 Wound/Ulcer of the Lower Trauma, Other Extremity Extremity Comorbid History: Coronary Artery Disease, N/A N/A Hypertension, Type II Diabetes Date Acquired: 04/20/2020 04/20/2020 08/23/2021 Weeks of Treatment: '9 9 2 '$ Wound Status: Open Amputation Healed - Epithelialized Wound Recurrence: No No No Measurements L x W x D (cm) 0.2x0.2x0.2 N/A 0x0x0 Area (cm) : 0.031 N/A 0 Volume (cm) : 0.006 N/A 0 % Reduction in Area: 83.50% N/A 100.00% % Reduction in Volume: 92.00% N/A 100.00% Classification: Grade 2 Grade 2 Full Thickness Without Exposed Support Structures Exudate Amount: Medium Medium Medium Exudate Type: Serosanguineous Serosanguineous Serosanguineous Exudate Color: red, brown red, brown red, brown Granulation Amount: Large (67-100%) N/A N/A Granulation Quality: Red,  Pink N/A N/A Necrotic Amount: Small (1-33%) N/A N/A Exposed Structures: Fat Layer (Subcutaneous Tissue): N/A N/A Yes Fascia: No Tendon: No Muscle: No Joint: No Bone: No Epithelialization: None N/A N/A Treatment Notes Electronic Signature(s) Signed: 09/16/2021 2:38:02 PM By: Massie Kluver Entered By: Massie Kluver on 09/16/2021 10:04:31 [name] ([id number]) -------------------------------------------------------------------------------- [place] Details Patient Name: [name] Date of Service: 09/16/2021 9:00 AM Medical Record Number: [id number] Patient Account Number: [id number] Date of Birth/Sex: 10-04-1960 (61 y.o. M) Treating RN: Cornell Barman Primary Care Kandas Oliveto: Drema Dallas Other Clinician: Massie Kluver Referring Karter Haire: Drema Dallas Treating Lakita Sahlin/Extender: Skipper Cliche in Treatment: 9 Active Inactive Necrotic Tissue Nursing Diagnoses: Impaired tissue integrity related to necrotic/devitalized tissue Knowledge deficit related to management of necrotic/devitalized tissue  Goals: Necrotic/devitalized tissue will be minimized in the wound bed Date Initiated: 08/21/2021 Target Resolution Date: 08/21/2021 Goal Status: Active Patient/caregiver will verbalize understanding of reason and process for debridement of necrotic tissue Date Initiated: 08/21/2021 Target Resolution Date: 08/21/2021 Goal Status: Active Interventions: Assess patient pain level pre-, during and post procedure and prior to discharge Provide education on necrotic tissue and debridement process Treatment Activities: Apply topical anesthetic as ordered : 08/21/2021 Excisional debridement : 08/21/2021 Notes: Wound/Skin Impairment Nursing Diagnoses: Knowledge deficit related to ulceration/compromised skin integrity Goals: Patient/caregiver will verbalize understanding of skin care regimen Date Initiated: 07/15/2021 Target Resolution Date: 08/15/2021 Goal  Status: Active Ulcer/skin breakdown will have a volume reduction of 30% by week 4 Date Initiated: 07/15/2021 Target Resolution Date: 08/15/2021 Goal Status: Active Ulcer/skin breakdown will have a volume reduction of 50% by week 8 Date Initiated: 07/15/2021 Target Resolution Date: 09/14/2021 Goal Status: Active Ulcer/skin breakdown will have a volume reduction of 80% by week 12 Date Initiated: 07/15/2021 Target Resolution Date: 10/15/2021 Goal Status: Active Ulcer/skin breakdown will heal within 14 weeks Date Initiated: 07/15/2021 Target Resolution Date: 11/14/2021 Goal Status: Active Interventions: Assess patient/caregiver ability to obtain necessary supplies Assess patient/caregiver ability to perform ulcer/skin care regimen upon admission and as needed Assess ulceration(s) every visit Notes: SENA, CLOUATRE ([id number]) Electronic Signature(s) Signed: 09/16/2021 1:58:17 PM By: Gretta Cool, BSN, RN, CWS, Kim RN, BSN Signed: 09/16/2021 2:38:02 PM By: Massie Kluver Entered By: Massie Kluver on 09/16/2021 10:03:56 Bensch, Arturo Chauncey Cruel ([id number]) -------------------------------------------------------------------------------- Pain Assessment Details Patient Name: [name] Date of Service: 09/16/2021 9:00 AM Medical Record Number: [id number] Patient Account Number: [id number] Date of Birth/Sex: [birth year] (61 y.o. M) Treating RN: Cornell Barman Primary Care Milt Coye: Drema Dallas Other Clinician: Massie Kluver Referring Wynee Matarazzo: Drema Dallas Treating Kree Rafter/Extender: Skipper Cliche in Treatment: 9 Active Problems Location of Pain Severity and Description of Pain Patient Has Paino No Site Locations Pain Management and Medication Current Pain Management: Electronic Signature(s) Signed: 09/16/2021 1:58:17 PM By: Gretta Cool, BSN, RN, CWS, Kim RN, BSN Signed: 09/16/2021 2:38:02 PM By: Massie Kluver Entered By: Massie Kluver on 09/16/2021 09:35:21 Centola, Abrian Chauncey Cruel  ([id number]) -------------------------------------------------------------------------------- Patient/Caregiver Education Details Patient Name: [name] Date of Service: 09/16/2021 9:00 AM Medical Record Number: [id number] Patient Account Number: [id number] Date of Birth/Gender: 12/04/1960 (61 y.o. M) Treating RN: Cornell Barman Primary Care Physician: Drema Dallas Other Clinician: Massie Kluver Referring Physician: Drema Dallas Treating Physician/Extender: Skipper Cliche in Treatment: 9 Education Assessment Education Provided To: Patient Education Topics Provided Offloading: Handouts: Other: with darby shoe Methods: Explain/Verbal Responses: State content correctly Electronic Signature(s) Signed: 09/16/2021 2:38:02 PM By: Massie Kluver Entered By: Massie Kluver on 09/16/2021 10:22:11 Bailon, Zaki Chauncey Cruel ([id number]) -------------------------------------------------------------------------------- Wound Assessment Details Patient Name: [name] Date of Service: 09/16/2021 9:00 AM Medical Record Number: [id number] Patient Account Number: [id number] Date of Birth/Sex: [birth year] (61 y.o. M) Treating RN: Cornell Barman Primary Care Ludwika Rodd: Drema Dallas Other Clinician: Massie Kluver Referring Clarann Helvey: Drema Dallas Treating Levoy Geisen/Extender: Skipper Cliche in Treatment: 9 Wound Status Wound Number: 2 Primary Etiology: Diabetic Wound/Ulcer of the Lower Extremity Wound Location: Right, Plantar Foot Wound Status: Open Wounding Event: Gradually Appeared Comorbid Coronary Artery Disease, Hypertension, Type II History: Diabetes Date Acquired: 04/20/2020 Weeks Of Treatment: 9 Clustered Wound: No Photos Wound Measurements Length: (cm) 0.2 % Redu Width: (cm) 0.2 % Redu Depth: (cm) 0.2 Epithe Area: (cm) 0.031 Tunne Volume: (cm) 0.006 Under ction in Area: 83.5% ction in Volume: 92% lialization: None ling: No mining: No  Wound  Description Classification: Grade 2 Foul O Exudate Amount: Medium Slough Exudate Type: Serosanguineous Exudate Color: red, brown dor After Cleansing: No /Fibrino Yes Wound Bed Granulation Amount: Large (67-100%) Exposed Structure Granulation Quality: Red, Pink Fascia Exposed: No Necrotic Amount: Small (1-33%) Fat Layer (Subcutaneous Tissue) Exposed: Yes Necrotic Quality: Adherent Slough Tendon Exposed: No Muscle Exposed: No Joint Exposed: No Bone Exposed: No Treatment Notes Wound #2 (Foot) Wound Laterality: Plantar, Right Cleanser Peri-Wound Care Topical Primary Dressing Blew, Loghan S. ([id number]) Hydrofera Blue Ready Transfer Foam, 2.5x2.5 (in/in) Discharge Instruction: Apply Hydrofera Blue Ready to wound bed as directed Secondary Dressing Gauze Discharge Instruction: As directed: dry, moistened with saline or moistened with Dakins Solution Secured With Coban Cohesive Bandage 4x5 (yds) Stretched Discharge Instruction: Apply Coban as directed. Compression Wrap Compression Stockings Add-Ons Electronic Signature(s) Signed: 09/16/2021 1:58:17 PM By: Gretta Cool, BSN, RN, CWS, Kim RN, BSN Signed: 09/16/2021 2:38:02 PM By: Massie Kluver Entered By: Massie Kluver on 09/16/2021 09:42:41 Mangrum, Damarea Chauncey Cruel ([id number]) -------------------------------------------------------------------------------- Wound Assessment Details Patient Name: [name] Date of Service: 09/16/2021 9:00 AM Medical Record Number: [id number] Patient Account Number: [id number] Date of Birth/Sex: 1960-04-27 (61 y.o. M) Treating RN: Cornell Barman Primary Care Kendrick Haapala: Drema Dallas Other Clinician: Massie Kluver Referring Zillah Alexie: Drema Dallas Treating Ab Leaming/Extender: Skipper Cliche in Treatment: 9 Wound Status Wound Number: 3 Primary Etiology: Diabetic Wound/Ulcer of the Lower Extremity Wound Location: Left Metatarsal head fifth Wound Status: Amputation Wounding Event: Gradually  Appeared Outcome Level: Digit/Ray Date Acquired: 04/20/2020 Weeks Of Treatment: 9 Clustered Wound: No Wound Description Classification: Grade 2 Exudate Amount: Medium Exudate Type: Serosanguineous Exudate Color: red, brown Treatment Notes Wound #3 (Metatarsal head fifth) Wound Laterality: Left Cleanser Peri-Wound Care Topical Primary Dressing Secondary Dressing Secured With Compression Wrap Compression Stockings Add-Ons Electronic Signature(s) Signed: 09/16/2021 1:58:17 PM By: Gretta Cool, BSN, RN, CWS, Kim RN, BSN Entered By: Gretta Cool, BSN, RN, CWS, Kim on 09/16/2021 10:00:10 [name] ([id number]) -------------------------------------------------------------------------------- Wound Assessment Details Patient Name: [name] Date of Service: 09/16/2021 9:00 AM Medical Record Number: [id number] Patient Account Number: [id number] Date of Birth/Sex: [birth year] (61 y.o. M) Treating RN: Cornell Barman Primary Care Marchello Rothgeb: Drema Dallas Other Clinician: Massie Kluver Referring Jamia Hoban: Drema Dallas Treating Aleshka Corney/Extender: Skipper Cliche in Treatment: 9 Wound Status Wound Number: 4 Primary Etiology: Trauma, Other Wound Location: Left Toe Third Wound Status: Healed - Epithelialized Wounding Event: Trauma Date Acquired: 08/23/2021 Weeks Of Treatment: 2 Clustered Wound: No Wound Measurements Length: (cm) 0 Width: (cm) 0 Depth: (cm) 0 Area: (cm) Volume: (cm) % Reduction in Area: 100% % Reduction in Volume: 100% 0 0 Wound Description Classification: Full Thickness Without Exposed Support Structu Exudate Amount: Medium Exudate Type: Serosanguineous Exudate Color: red, brown res Treatment Notes Wound #4 (Toe Third) Wound Laterality: Left Cleanser Peri-Wound Care Topical Primary Dressing Secondary Dressing Secured With Compression Wrap Compression Stockings Add-Ons Electronic Signature(s) Signed: 09/16/2021 1:58:17 PM By: Gretta Cool, BSN, RN,  CWS, Kim RN, BSN Signed: 09/16/2021 2:38:02 PM By: Massie Kluver Entered By: Massie Kluver on 09/16/2021 10:03:21 Subramaniam, Jamerson Chauncey Cruel ([id number]) -------------------------------------------------------------------------------- [place] Details Patient Name: [name] Date of Service: 09/16/2021 9:00 AM Medical Record Number: [id number] Patient Account Number: [id number] Date of Birth/Sex: December 23, 1960 (61 y.o. M) Treating RN: Cornell Barman Primary Care Tasha Jindra: Drema Dallas Other Clinician: Massie Kluver Referring Cheskel Silverio: Drema Dallas Treating Lacy Taglieri/Extender: Skipper Cliche in Treatment: 9 Vital Signs Time Taken: 09:34 Temperature (F): 98.1 Weight (lbs): 230 Pulse (bpm): 66 Respiratory Rate (breaths/min): 18 Blood Pressure (mmHg): 152/89  Reference Range: 80 - 120 mg / dl Electronic Signature(s) Signed: 09/16/2021 2:38:02 PM By: Massie Kluver Entered By: Massie Kluver on 09/16/2021 09:35:15

## 2021-09-16 NOTE — Progress Notes (Addendum)
TARREN, VELARDI ([id number]) Visit Report for 09/16/2021 Chief Complaint Document Details Patient Name: Charles [name], Charles [name] Date of Service: 09/16/2021 9:00 AM Medical Record Number: [id number] Patient Account Number: [id number] Date of Birth/Sex: [birth year] (61 y.o. M) Treating RN: Cornell Barman Primary Care Provider: Drema Dallas Other Clinician: Massie Kluver Referring Provider: Drema Dallas Treating Provider/Extender: Skipper Cliche in Treatment: 9 Information Obtained from: Patient Chief Complaint Left lateral foot and right plantar foot ulcer - NON-Workers Comp Ulcers Electronic Signature(s) Signed: 09/16/2021 8:54:09 AM By: Worthy Keeler PA-C Entered By: Worthy Keeler on 09/16/2021 08:54:09 Charles [name] ([id number]) -------------------------------------------------------------------------------- Debridement Details Patient Name: Charles [name] Date of Service: 09/16/2021 9:00 AM Medical Record Number: [id number] Patient Account Number: [id number] Date of Birth/Sex: 06/21/1960 (61 y.o. M) Treating RN: Cornell Barman Primary Care Provider: Drema Dallas Other Clinician: Massie Kluver Referring Provider: Drema Dallas Treating Provider/Extender: Skipper Cliche in Treatment: 9 Debridement Performed for Wound #2 Right,Plantar Foot Assessment: Performed By: Physician Tommie Sams., PA-C Debridement Type: Debridement Severity of Tissue Pre Debridement: Fat layer exposed Level of Consciousness (Pre- Awake and Alert procedure): Pre-procedure Verification/Time Out Yes - 10:03 Taken: Start Time: 10:03 Pain Control: Lidocaine Total Area Debrided (L x W): 2 (cm) x 2 (cm) = 4 (cm) Tissue and other material Viable, Non-Viable, Callus, Slough, Subcutaneous, Biofilm, Slough debrided: Level: Skin/Subcutaneous Tissue Debridement Description: Excisional Instrument: Curette Bleeding: Minimum Hemostasis Achieved: Pressure End Time: 10:10 Response to  Treatment: Procedure was tolerated well Level of Consciousness (Post- Awake and Alert procedure): Post Debridement Measurements of Total Wound Length: (cm) 0.4 Width: (cm) 0.5 Depth: (cm) 0.3 Volume: (cm) 0.047 Character of Wound/Ulcer Post Debridement: Stable Severity of Tissue Post Debridement: Fat layer exposed Post Procedure Diagnosis Same as Pre-procedure Electronic Signature(s) Signed: 09/16/2021 12:09:48 PM By: Worthy Keeler PA-C Signed: 09/16/2021 1:58:17 PM By: Gretta Cool, BSN, RN, CWS, Kim RN, BSN Signed: 09/16/2021 2:38:02 PM By: Massie Kluver Entered By: Massie Kluver on 09/16/2021 10:11:13 Charles [name] ([id number]) -------------------------------------------------------------------------------- HPI Details Patient Name: Charles [name] Date of Service: 09/16/2021 9:00 AM Medical Record Number: [id number] Patient Account Number: [id number] Date of Birth/Sex: July 07, 1960 (61 y.o. M) Treating RN: Cornell Barman Primary Care Provider: Drema Dallas Other Clinician: Massie Kluver Referring Provider: Drema Dallas Treating Provider/Extender: Skipper Cliche in Treatment: 9 History of Present Illness HPI Description: 5.7 diet controlled Workers Compensation Chart: 07/15/2021 upon evaluation today patient appears to be doing decently well in regard to a wound on the left foot first great toe amputation site. This is a wound that has been taken care of by Dr. Luana Shu up to this point at podiatry. He also has been seeing the patient for wounds that are non-Worker's Comp. related that will be dictated in a another note to this is actually the [employer] note currently. The patient sustained this injury when he was at work and apparently there was a machine that you stood beside that would come down to slice something that apparently his great toe got caught in the machine and removed a portion of this and ended up requiring a amputation of the digit completely.  Subsequently this had for the most part healed but there was a small dehiscence and its not closed completely. Again this is the [employer] portion of his issues going on at this point. The good news is there does not appear to be any signs of infection right now which is great news. The patient does have several medical problems noted below.  The patient does have diabetes mellitus type 2, hypertension, and coronary artery disease. Of note however his hemoglobin A1c is actually very good at 5.7 and this is diet controlled. Overall this should both well for healing with appropriate offloading which I think is going to be probably the primary focus of what we will be doing. The good news is he is also had x-rays which did not show any evidence of osteomyelitis he has not had an MRI since surgery. With regard to the non-Worker's Comp. wounds he has 1 on the fifth metatarsal lateral region of the left foot as well as the plantar aspect of the right foot. Again these are not Worker's Comp. related and are being treated separately. Nonetheless we were not able to document a separate note between the [employer] and the regular at this point and therefore were combining everything as I wanted to be documented and not left out. 07-24-2021 upon evaluation today patient appears to be doing a little better in regard to the wounds on the lateral aspect of his left foot as well as the right plantar foot. He is showing again some signs of increased granulation I am pleased in that regard. Fortunately I do not see any signs of active infection locally or systemically which is great news. No fevers, chills, nausea, vomiting, or diarrhea. I do think that potentially we may need to look into repeat evaluation possibly MRI of the left foot if things do not show signs of improvement previously in the area had a CT scan which showed no osteomyelitis but I believe that was January 2023. That could have changed in the  interim and we need to keep a close eye on things here. 07-31-2021 upon evaluation today patient appears to be doing well with regard to his wounds. He does have bone exposed on the left lateral foot although I could not identify any necrotic bone in particular. With that being said there is a lot of callus around the edges of the plantar foot right and I am going to perform some debridement on both locations. 08-07-2021 upon evaluation today patient appears to be doing well with regard to his wounds all things considered. The right plantar foot is doing better. The left lateral foot unfortunately still shows bone in the base of the wound. There is some concern for the possibility of osteomyelitis based on what we are seeing his previous CT scan did not show this but that was back in January 3 months past. At this point the wound does appear to be deeper with bone exposure and subsequently I do believe that there is a great risk year of there actually being some infection present. Subsequently we have attempted to order a repeat CT scan this has been denied by insurance I have no documentation as to why that may be. The patient has not received anything and this was the first he heard of it this morning we just found out yesterday. Nonetheless I am going to have him contact them he offered and stated that he would give them a call anyway I am definitely okay with that. Depending on what he can find out or if he can even get any information for me to get in touch with someone I will be happy to discuss the case with a peer to peer reviewer as well. 08-21-2021 upon evaluation today patient appears to be doing okay in regard to his wounds. He still has not found out any information regarding  the CT scan with his insurance. We are going to need to try to follow-up on this as honestly he really does need a CT scan to see what exactly is going on and to be honest I think that a peer to peer review would likely  achieve the what we need as far as getting this done. He did have a repeat x-ray with podiatry and apparently they saw some stuff that did make him concerned about the fact that he needs the CT scan as well they are unsure why he is being denied. I am in complete agreement as far as that is concerned. 08-28-2021 upon evaluation today patient unfortunately appears to be doing significantly worse in regard to his left leg. He actually has erythema all the way up from the foot to his knee. This is significantly swollen as well. Again we have been trying to get a CT scan for several weeks and insurance has denied this. Stating it was not medically necessary. With that being said I do believe it is and I do believe he likely has osteomyelitis this has been stated by both myself as well as his podiatrist. Nonetheless we have not been up to this point able to get approval to proceed with the imaging study. At this point I believe the patient is going require ER evaluation and likely admission to the hospital to get the infection under control this appears to be a quite significant infection of the left lower extremity extending again from the foot all the way up to the knee. 09-04-2021 upon evaluation today patient appears to be doing he tells me much better compared to last time I saw him. He subsequently went to the hospital after I saw him last he was having a lot of issues. We have been denied by insurance up to that point for obtaining the CT scan which I felt like was necessary in order to evaluate and confirm the extent and quantify the osteomyelitis that I felt was likely present this was true of the podiatrist as well but we were unable to obtain that approval. Subsequently when the patient came in he was looking significantly worse ended up sending him to the hospital for further evaluation and treatment. That was actually on 08-28-2021. Subsequently has been in the hospital until yesterday and he did  have 1/5 digit and partial ray amputation which was undertaken on 08-30-2021. This was due to osteomyelitis. I do have the results of the pathology as well which shows that the patient does have osteomyelitis which involves the bone margin. This means that Charles [name], [name] ([id number]) he has not completely clear the osteomyelitis and for this reason he has been placed on oral antibiotics as well he is taking amoxicillin currently due to Enterococcus that is the pathologic organism that was noted on evaluation. With that being said this means that he is still at risk for this worsening and to be perfectly honest I do believe he is not only a candidate for HBO therapy but it would probably be a really good option for him to undertake HBO therapy in order to try and get this infection completely cleared so it does not continue to be an issue for him and end up with further amputation. The patient voiced understanding he is actually in agreement the plan. We just need to continue with the approval process for this. 09-16-2021 upon evaluation today patient's wound on the left foot in regard to the third toe actually appears  to be healed based on what I am seeing I do not see anything open at this point he has had no drainage she tells me for the past week. With that being said there does appear to be new skin I think we still need to keep a close eye on things here. With that being said his right foot on the plantar aspect is still showing signs of being open there is quite a bit of callus buildup he is back to work at this point. This is going require some sharp debridement today. Electronic Signature(s) Signed: 09/16/2021 11:15:54 AM By: Worthy Keeler PA-C Entered By: Worthy Keeler on 09/16/2021 11:15:53 Charles [name] ([id number]) -------------------------------------------------------------------------------- Physical Exam Details Patient Name: Charles [name] Date of Service: 09/16/2021  9:00 AM Medical Record Number: [id number] Patient Account Number: [id number] Date of Birth/Sex: 26-Nov-1960 (61 y.o. M) Treating RN: Cornell Barman Primary Care Provider: Drema Dallas Other Clinician: Massie Kluver Referring Provider: Drema Dallas Treating Provider/Extender: Skipper Cliche in Treatment: 9 Constitutional Well-nourished and well-hydrated in no acute distress. Respiratory normal breathing without difficulty. Psychiatric this patient is able to make decisions and demonstrates good insight into disease process. Alert and Oriented x 3. pleasant and cooperative. Notes Upon inspection patient's wound bed actually showed signs of good granulation and epithelization in regard to the plantar aspect of his foot although he does need some sharp debridement on this right wound. He actually tolerated the debridement today without complication and postdebridement the wound bed appears to be doing much better which is great news. Electronic Signature(s) Signed: 09/16/2021 11:16:21 AM By: Worthy Keeler PA-C Entered By: Worthy Keeler on 09/16/2021 11:16:21 Charles [name] ([id number]) -------------------------------------------------------------------------------- Physician Orders Details Patient Name: Charles [name] Date of Service: 09/16/2021 9:00 AM Medical Record Number: [id number] Patient Account Number: [id number] Date of Birth/Sex: 03/07/1961 (61 y.o. M) Treating RN: Cornell Barman Primary Care Provider: Drema Dallas Other Clinician: Massie Kluver Referring Provider: Drema Dallas Treating Provider/Extender: Skipper Cliche in Treatment: 9 Verbal / Phone Orders: No Diagnosis Coding ICD-10 Coding Code Description E11.621 Type 2 diabetes mellitus with foot ulcer M86.672 Other chronic osteomyelitis, left ankle and foot L97.522 Non-pressure chronic ulcer of other part of left foot with fat layer exposed L97.512 Non-pressure chronic ulcer of other part of  right foot with fat layer exposed I10 Essential (primary) hypertension I25.10 Atherosclerotic heart disease of native coronary artery without angina pectoris Follow-up Appointments o Return Appointment in 1 week. Bathing/ Shower/ Hygiene o May shower; gently cleanse wound with antibacterial soap, rinse and pat dry prior to dressing wounds Edema Control - Lymphedema / Segmental Compressive Device / Other o Elevate, Exercise Daily and Avoid Standing for Long Periods of Time. o Elevate legs to the level of the heart and pump ankles as often as possible o Elevate leg(s) parallel to the floor when sitting. Off-Loading o Open toe surgical shoe Hyperbaric Oxygen Therapy o Evaluate for HBO Therapy - Left foot chronic osteomyelitis confirmed still involving the margin following partial 5th ray amputation o Indication and location: - osteomyelitis left 5th metatarsal amputation site o If appropriate for treatment, begin HBOT per protocol: o 2.0 ATA for 90 Minutes without Air Breaks o One treatment per day (delivered Monday through Friday unless otherwise specified in Special Instructions below): o Total # of Treatments: - 40 o Antihistamine 30 minutes prior to HBO Treatment, difficulty clearing ears. o Finger stick Blood Glucose Pre- and Post- HBOT Treatment. o Follow Hyperbaric Oxygen Glycemia Protocol  Wound Treatment Wound #2 - Foot Wound Laterality: Plantar, Right Primary Dressing: Hydrofera Blue Ready Transfer Foam, 2.5x2.5 (in/in) Every Other Day/30 Days Discharge Instructions: Apply Hydrofera Blue Ready to wound bed as directed Secondary Dressing: Gauze Every Other Day/30 Days Discharge Instructions: As directed: dry, moistened with saline or moistened with Dakins Solution Secured With: Coban Cohesive Bandage 4x5 (yds) Stretched Every Other Day/30 Days Discharge Instructions: Apply Coban as directed. GLYCEMIA INTERVENTIONS PROTOCOL PRE-HBO GLYCEMIA  INTERVENTIONS ACTION INTERVENTION Obtain pre-HBO capillary blood glucose (ensure 1 physician order is in chart). 2 If result is 70 mg/dl or below: A. Notify HBO physician and await physician orders. Charles [name], [name] ([id number]) B. If the result meets the hospital definition of a critical result, follow hospital policy. A. Give patient an 8 ounce Glucerna Shake, an 8 ounce Ensure, or 8 ounces of a Glucerna/Ensure equivalent dietary supplement*. B. Wait 30 minutes. If result is 71 mg/dl to 130 mg/dl: C. Retest patientos capillary blood glucose (CBG). D. If result greater than or equal to 110 mg/dl, proceed with HBO. If result less than 110 mg/dl, notify HBO physician and consider holding HBO. If result is 131 mg/dl to 249 mg/dl: A. Proceed with HBO. A. Notify HBO physician and await physician orders. B. It is recommended to hold HBO and do If result is 250 mg/dl or greater: blood/urine ketone testing. C. If the result meets the hospital definition of a critical result, follow hospital policy. POST-HBO GLYCEMIA INTERVENTIONS ACTION INTERVENTION Obtain post HBO capillary blood glucose (ensure 1 physician order is in chart). A. Notify HBO physician and await physician orders. 2 If result is 70 mg/dl or below: B. If the result meets the hospital definition of a critical result, follow hospital policy. A. Give patient an 8 ounce Glucerna Shake, an 8 ounce Ensure, or 8 ounces of a Glucerna/Ensure equivalent dietary supplement*. B. Wait 15 minutes for symptoms of hypoglycemia (i.e. nervousness, anxiety, If result is 71 mg/dl to 100 mg/dl: sweating, chills, clamminess, irritability, confusion, tachycardia or dizziness). C. If patient asymptomatic, discharge patient. If patient symptomatic, repeat capillary blood glucose (CBG) and notify HBO physician. If result is 101 mg/dl to 249 mg/dl: A. Discharge patient. A. Notify HBO physician and await  physician orders. B. It is recommended to do blood/urine If result is 250 mg/dl or greater: ketone testing. C. If the result meets the hospital definition of a critical result, follow hospital policy. *Juice or candies are NOT equivalent products. If patient refuses the Glucerna or Ensure, please consult the hospital dietitian for an appropriate substitute. Electronic Signature(s) Signed: 09/16/2021 12:09:48 PM By: Worthy Keeler PA-C Signed: 09/16/2021 2:38:02 PM By: Massie Kluver Entered By: Massie Kluver on 09/16/2021 10:20:40 Charles [name] ([id number]) -------------------------------------------------------------------------------- Problem List Details Patient Name: Charles [name] Date of Service: 09/16/2021 9:00 AM Medical Record Number: [id number] Patient Account Number: [id number] Date of Birth/Sex: October 01, 1960 (61 y.o. M) Treating RN: Cornell Barman Primary Care Provider: Drema Dallas Other Clinician: Massie Kluver Referring Provider: Drema Dallas Treating Provider/Extender: Skipper Cliche in Treatment: 9 Active Problems ICD-10 Encounter Code Description Active Date MDM Diagnosis E11.621 Type 2 diabetes mellitus with foot ulcer 07/15/2021 No Yes M86.672 Other chronic osteomyelitis, left ankle and foot 09/04/2021 No Yes L97.522 Non-pressure chronic ulcer of other part of left foot with fat layer 07/24/2021 No Yes exposed L97.512 Non-pressure chronic ulcer of other part of right foot with fat layer 07/24/2021 No Yes exposed [place] (primary) hypertension 07/15/2021 No Yes I25.10 Atherosclerotic heart disease of native  coronary artery without angina 07/15/2021 No Yes pectoris Inactive Problems Resolved Problems Electronic Signature(s) Signed: 09/16/2021 8:54:07 AM By: Worthy Keeler PA-C Entered By: Worthy Keeler on 09/16/2021 08:54:07 Charles [name], Charles [name]  ([id number]) -------------------------------------------------------------------------------- Progress Note Details Patient Name: Charles [name] Date of Service: 09/16/2021 9:00 AM Medical Record Number: [id number] Patient Account Number: [id number] Date of Birth/Sex: May 25, 1960 (61 y.o. M) Treating RN: Cornell Barman Primary Care Provider: Drema Dallas Other Clinician: Massie Kluver Referring Provider: Drema Dallas Treating Provider/Extender: Skipper Cliche in Treatment: 9 Subjective Chief Complaint Information obtained from Patient Left lateral foot and right plantar foot ulcer - NON-Workers Comp Ulcers History of Present Illness (HPI) 5.7 diet controlled Workers Compensation Chart: 07/15/2021 upon evaluation today patient appears to be doing decently well in regard to a wound on the left foot first great toe amputation site. This is a wound that has been taken care of by Dr. Luana Shu up to this point at podiatry. He also has been seeing the patient for wounds that are non-Worker's Comp. related that will be dictated in a another note to this is actually the [employer] note currently. The patient sustained this injury when he was at work and apparently there was a machine that you stood beside that would come down to slice something that apparently his great toe got caught in the machine and removed a portion of this and ended up requiring a amputation of the digit completely. Subsequently this had for the most part healed but there was a small dehiscence and its not closed completely. Again this is the [employer] portion of his issues going on at this point. The good news is there does not appear to be any signs of infection right now which is great news. The patient does have several medical problems noted below. The patient does have diabetes mellitus type 2, hypertension, and coronary artery disease. Of note however his hemoglobin A1c is actually very good at 5.7 and  this is diet controlled. Overall this should both well for healing with appropriate offloading which I think is going to be probably the primary focus of what we will be doing. The good news is he is also had x-rays which did not show any evidence of osteomyelitis he has not had an MRI since surgery. With regard to the non-Worker's Comp. wounds he has 1 on the fifth metatarsal lateral region of the left foot as well as the plantar aspect of the right foot. Again these are not Worker's Comp. related and are being treated separately. Nonetheless we were not able to document a separate note between the [employer] and the regular at this point and therefore were combining everything as I wanted to be documented and not left out. 07-24-2021 upon evaluation today patient appears to be doing a little better in regard to the wounds on the lateral aspect of his left foot as well as the right plantar foot. He is showing again some signs of increased granulation I am pleased in that regard. Fortunately I do not see any signs of active infection locally or systemically which is great news. No fevers, chills, nausea, vomiting, or diarrhea. I do think that potentially we may need to look into repeat evaluation possibly MRI of the left foot if things do not show signs of improvement previously in the area had a CT scan which showed no osteomyelitis but I believe that was January 2023. That could have changed in the interim and we need to  keep a close eye on things here. 07-31-2021 upon evaluation today patient appears to be doing well with regard to his wounds. He does have bone exposed on the left lateral foot although I could not identify any necrotic bone in particular. With that being said there is a lot of callus around the edges of the plantar foot right and I am going to perform some debridement on both locations. 08-07-2021 upon evaluation today patient appears to be doing well with regard to his wounds all  things considered. The right plantar foot is doing better. The left lateral foot unfortunately still shows bone in the base of the wound. There is some concern for the possibility of osteomyelitis based on what we are seeing his previous CT scan did not show this but that was back in January 3 months past. At this point the wound does appear to be deeper with bone exposure and subsequently I do believe that there is a great risk year of there actually being some infection present. Subsequently we have attempted to order a repeat CT scan this has been denied by insurance I have no documentation as to why that may be. The patient has not received anything and this was the first he heard of it this morning we just found out yesterday. Nonetheless I am going to have him contact them he offered and stated that he would give them a call anyway I am definitely okay with that. Depending on what he can find out or if he can even get any information for me to get in touch with someone I will be happy to discuss the case with a peer to peer reviewer as well. 08-21-2021 upon evaluation today patient appears to be doing okay in regard to his wounds. He still has not found out any information regarding the CT scan with his insurance. We are going to need to try to follow-up on this as honestly he really does need a CT scan to see what exactly is going on and to be honest I think that a peer to peer review would likely achieve the what we need as far as getting this done. He did have a repeat x-ray with podiatry and apparently they saw some stuff that did make him concerned about the fact that he needs the CT scan as well they are unsure why he is being denied. I am in complete agreement as far as that is concerned. 08-28-2021 upon evaluation today patient unfortunately appears to be doing significantly worse in regard to his left leg. He actually has erythema all the way up from the foot to his knee. This is  significantly swollen as well. Again we have been trying to get a CT scan for several weeks and insurance has denied this. Stating it was not medically necessary. With that being said I do believe it is and I do believe he likely has osteomyelitis this has been stated by both myself as well as his podiatrist. Nonetheless we have not been up to this point able to get approval to proceed with the imaging study. At this point I believe the patient is going require ER evaluation and likely admission to the hospital to get the infection under control this appears to be a quite significant infection of the left lower extremity extending again from the foot all the way up to the knee. 09-04-2021 upon evaluation today patient appears to be doing he tells me much better compared to last time I saw  him. He subsequently went to the hospital after I saw him last he was having a lot of issues. We have been denied by insurance up to that point for obtaining the CT scan which Charles [name], [name] ([id number]) I felt like was necessary in order to evaluate and confirm the extent and quantify the osteomyelitis that I felt was likely present this was true of the podiatrist as well but we were unable to obtain that approval. Subsequently when the patient came in he was looking significantly worse ended up sending him to the hospital for further evaluation and treatment. That was actually on 08-28-2021. Subsequently has been in the hospital until yesterday and he did have 1/5 digit and partial ray amputation which was undertaken on 08-30-2021. This was due to osteomyelitis. I do have the results of the pathology as well which shows that the patient does have osteomyelitis which involves the bone margin. This means that he has not completely clear the osteomyelitis and for this reason he has been placed on oral antibiotics as well he is taking amoxicillin currently due to Enterococcus that is the pathologic organism that was  noted on evaluation. With that being said this means that he is still at risk for this worsening and to be perfectly honest I do believe he is not only a candidate for HBO therapy but it would probably be a really good option for him to undertake HBO therapy in order to try and get this infection completely cleared so it does not continue to be an issue for him and end up with further amputation. The patient voiced understanding he is actually in agreement the plan. We just need to continue with the approval process for this. 09-16-2021 upon evaluation today patient's wound on the left foot in regard to the third toe actually appears to be healed based on what I am seeing I do not see anything open at this point he has had no drainage she tells me for the past week. With that being said there does appear to be new skin I think we still need to keep a close eye on things here. With that being said his right foot on the plantar aspect is still showing signs of being open there is quite a bit of callus buildup he is back to work at this point. This is going require some sharp debridement today. Objective Constitutional Well-nourished and well-hydrated in no acute distress. Vitals Time Taken: 9:34 AM, Weight: 230 lbs, Temperature: 98.1 F, Pulse: 66 bpm, Respiratory Rate: 18 breaths/min, Blood Pressure: 152/89 mmHg. Respiratory normal breathing without difficulty. Psychiatric this patient is able to make decisions and demonstrates good insight into disease process. Alert and Oriented x 3. pleasant and cooperative. General Notes: Upon inspection patient's wound bed actually showed signs of good granulation and epithelization in regard to the plantar aspect of his foot although he does need some sharp debridement on this right wound. He actually tolerated the debridement today without complication and postdebridement the wound bed appears to be doing much better which is great news. Integumentary  (Hair, Skin) Wound #2 status is Open. Original cause of wound was Gradually Appeared. The date acquired was: 04/20/2020. The wound has been in treatment 9 weeks. The wound is located on the [place]. The wound measures 0.2cm length x 0.2cm width x 0.2cm depth; 0.031cm^2 area and 0.006cm^3 volume. There is Fat Layer (Subcutaneous Tissue) exposed. There is no tunneling or undermining noted. There is a medium amount of serosanguineous  drainage noted. There is large (67-100%) red, pink granulation within the wound bed. There is a small (1-33%) amount of necrotic tissue within the wound bed including Adherent Slough. Wound #3 status is Amputation. Original cause of wound was Gradually Appeared. The date acquired was: 04/20/2020. The wound has been in treatment 9 weeks. The wound is located on the Left Metatarsal head fifth. There is a medium amount of serosanguineous drainage noted. Wound #4 status is Healed - Epithelialized. Original cause of wound was Trauma. The date acquired was: 08/23/2021. The wound has been in treatment 2 weeks. The wound is located on the Left Toe Third. The wound measures 0cm length x 0cm width x 0cm depth; 0cm^2 area and 0cm^3 volume. There is a medium amount of serosanguineous drainage noted. 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 ([id number]) Procedures Wound #2 Pre-procedure diagnosis of Wound #2 is a Diabetic Wound/Ulcer of the Lower Extremity located on the [place] .Severity of Tissue Pre Debridement is: Fat layer exposed. There was a Excisional Skin/Subcutaneous Tissue Debridement with  a total area of 4 sq cm performed by Tommie Sams., PA-C. With the following instrument(s): Curette to remove Viable and Non-Viable tissue/material. Material removed includes Callus, Subcutaneous Tissue, Slough, and Biofilm after achieving pain control using Lidocaine. No specimens were taken. A time out was conducted at 10:03, prior to the start of the procedure. A Minimum amount of bleeding was controlled with Pressure. The procedure was tolerated well. Post Debridement Measurements: 0.4cm length x 0.5cm width x 0.3cm depth; 0.047cm^3 volume. Character of Wound/Ulcer Post Debridement is stable. Severity of Tissue Post Debridement is: Fat layer exposed. Post procedure Diagnosis Wound #2: Same as Pre-Procedure Plan Follow-up Appointments: Return Appointment in 1 week. Bathing/ Shower/ Hygiene: May shower; gently cleanse wound with antibacterial soap, rinse and pat dry prior to dressing wounds Edema Control - Lymphedema / Segmental Compressive Device / Other: Elevate, Exercise Daily and Avoid Standing for Long Periods of Time. Elevate legs to the level of the heart and pump ankles as often as possible Elevate leg(s) parallel to the floor when sitting. Off-Loading: Open toe surgical shoe Hyperbaric Oxygen Therapy: Evaluate for HBO Therapy - Left foot chronic osteomyelitis confirmed still involving the margin following partial 5th ray amputation Indication and location: - osteomyelitis left 5th metatarsal amputation site If appropriate for treatment, begin HBOT per protocol: 2.0 ATA for 90 Minutes without Air Breaks One treatment per day (delivered Monday through Friday unless otherwise specified in Special Instructions below): Total # of Treatments: - 40 Antihistamine 30 minutes prior to HBO Treatment, difficulty clearing ears. Finger stick Blood Glucose Pre- and Post- HBOT Treatment. Follow Hyperbaric Oxygen Glycemia Protocol WOUND #2: - Foot Wound Laterality: Plantar, Right Primary  Dressing: Hydrofera Blue Ready Transfer Foam, 2.5x2.5 (in/in) Every Other Day/30 Days Discharge Instructions: Apply Hydrofera Blue Ready to wound bed as directed Secondary Dressing: Gauze Every Other Day/30 Days Discharge Instructions: As directed: dry, moistened with saline or moistened with Dakins Solution Secured With: Coban Cohesive Bandage 4x5 (yds) Stretched Every Other Day/30 Days Discharge Instructions: Apply Coban as directed. 1. Based on what I am seeing currently my suggestion is good to be that we go ahead and  continue with the [facility] for the right plantar foot. 2. With regard to the third toe left foot this appears to be healed. 3. With regard to his left lateral foot this is something that is still under surgical care and again he did have residual osteo noted in the base of the wound hence the reason we are proceeding with hyperbarics per the request again of podiatry. The patient is in agreement with plan he actually starts that today and we will see how things go. We will see patient back for reevaluation in 1 week here in the clinic. If anything worsens or changes patient will contact our office for additional recommendations. Electronic Signature(s) Signed: 09/16/2021 11:16:55 AM By: Worthy Keeler PA-C Entered By: Worthy Keeler on 09/16/2021 11:16:55 Charles [name], Charles [name] ([id number]) -------------------------------------------------------------------------------- SuperBill Details Patient Name: Charles [name] Date of Service: 09/16/2021 Medical Record Number: [id number] Patient Account Number: [id number] Date of Birth/Sex: [birth year] (61 y.o. M) Treating RN: Cornell Barman Primary Care Provider: Drema Dallas Other Clinician: Massie Kluver Referring Provider: Drema Dallas Treating Provider/Extender: Skipper Cliche in Treatment: 9 Diagnosis Coding ICD-10 Codes Code Description E11.621 Type 2 diabetes mellitus with foot ulcer M86.672 Other chronic  osteomyelitis, left ankle and foot L97.522 Non-pressure chronic ulcer of other part of left foot with fat layer exposed L97.512 Non-pressure chronic ulcer of other part of right foot with fat layer exposed I10 Essential (primary) hypertension I25.10 Atherosclerotic heart disease of native coronary artery without angina pectoris Facility Procedures CPT4 Code: 85929244 Description: 62863 - DEB SUBQ TISSUE 20 SQ CM/< Modifier: Quantity: 1 CPT4 Code: Description: ICD-10 Diagnosis Description L97.512 Non-pressure chronic ulcer of other part of right foot with fat layer ex Modifier: posed Quantity: Physician Procedures CPT4 Code: 8177116 Description: 99214 - WC PHYS LEVEL 4 - EST PT Modifier: 25 Quantity: 1 CPT4 Code: Description: ICD-10 Diagnosis Description E11.621 Type 2 diabetes mellitus with foot ulcer M86.672 Other chronic osteomyelitis, left ankle and foot L97.522 Non-pressure chronic ulcer of other part of left foot with fat layer exp L97.512 Non-pressure  chronic ulcer of other part of right foot with fat layer ex Modifier: osed posed Quantity: CPT4 Code: 5790383 Description: 11042 - WC PHYS SUBQ TISS 20 SQ CM Modifier: Quantity: 1 CPT4 Code: Description: ICD-10 Diagnosis Description L97.512 Non-pressure chronic ulcer of other part of right foot with fat layer ex Modifier: posed Quantity: Electronic Signature(s) Signed: 09/16/2021 11:17:25 AM By: Worthy Keeler PA-C Entered By: Worthy Keeler on 09/16/2021 11:17:25

## 2021-09-16 NOTE — Progress Notes (Signed)
Charles [name], Charles [name] ([id number]) Visit Report for 09/16/2021 HBO Details Patient Name: Charles [name], Charles [name] Date of Service: 09/16/2021 10:00 AM Medical Record Number: [id number] Patient Account Number: [id number] Date of Birth/Sex: 06-Nov-1960 (61 y.o. M) Treating RN: Primary Care Vienna Folden: Drema Dallas Other Clinician: Referring Abdallah Hern: Drema Dallas Treating Fynn Vanblarcom/Extender: Skipper Cliche in Treatment: 9 HBO Treatment Course Details Treatment Course Number: 1 Ordering Caileen Veracruz: Jeri Cos Total Treatments Ordered: 40 HBO Treatment Start Date: 09/16/2021 HBO Indication: Chronic Refractory Osteomyelitis to Left 5th Ray Metatarsal Amputation Site HBO Treatment Details Treatment Number: 1 Patient Type: Outpatient Chamber Type: Monoplace Chamber Serial #: [id number] Treatment Protocol: 2.0 ATA with 90 minutes oxygen, and no air breaks Treatment Details Compression Rate Down: 1.0 psi / minute De-Compression Rate Up: 1.5 psi / minute Compress Tx Pressure Air breaks and breathing periods Decompress Decompress Begins Reached (leave unused spaces blank) Begins Ends Chamber Pressure (ATA) 1 2 - - - - - - 2 1 Clock Time (24 hr) 10:53 11:08 - - - - - - 12:38 12:48 Treatment Length: 115 (minutes) Treatment Segments: 4 Vital Signs Capillary Blood Glucose Reference Range: 80 - 120 mg / dl HBO Diabetic Blood Glucose Intervention Range: <131 mg/dl or >249 mg/dl Time Capillary Blood Glucose Pulse Blood Respiratory Action Type: Vitals Pulse: Temperature: Glucose Meter Oximetry Pressure: Rate: Taken: Taken: (mg/dl): #: (%) gave Ensure with no recheck per Pre 10:40 142/90 72 18 98 113 1 PA Post 12:53 138/86 60 16 97.9 91 1 none per PA; released to home Pre-Treatment Ear Evaluation Left Right Clear: Yes Clear: Yes Intact: Yes Intact: Yes Color: grey Color: Grey PE Tubes inserted: No PE Tubes inserted: No Irrigated: No Irrigated: No Myringotomy performed: No Myringotomy  performed: No Left Teed Scale: Grade 0 Right Teed Scale: Grade 0 Treatment Response Treatment Toleration: Well Treatment Completion Treatment Completed without Adverse Event Status: HBO Attestation I certify that I supervised this HBO treatment in accordance with Medicare guidelines. A trained emergency response team is readily Yes available per hospital policies and procedures. Continue HBOT as ordered. Charles [name], Charles [name] ([id number]) Electronic Signature(s) Signed: 09/16/2021 5:18:19 PM By: Worthy Keeler PA-C Previous Signature: 09/16/2021 1:59:04 PM Version By: Enedina Finner RCP, RRT, CHT Previous Signature: 09/16/2021 12:09:48 PM Version By: Worthy Keeler PA-C Entered By: Worthy Keeler on 09/16/2021 17:18:18 Charles [name], Charles [name] ([id number]) -------------------------------------------------------------------------------- HBO Safety Checklist Details Patient Name: Charles [name] Date of Service: 09/16/2021 10:00 AM Medical Record Number: [id number] Patient Account Number: [id number] Date of Birth/Sex: 11-08-1960 (61 y.o. M) Treating RN: Primary Care Zay Yeargan: Drema Dallas Other Clinician: Referring Ellery Tash: Drema Dallas Treating Tameisha Covell/Extender: Skipper Cliche in Treatment: 9 HBO Safety Checklist Items Safety Checklist Consent Form Signed Patient voided / foley secured and emptied When did you last eato 09/15/21 pm Last dose of injectable or oral agent n/a Ostomy pouch emptied and vented if applicable NA All implantable devices assessed, documented and approved NA Intravenous access site secured and place NA Valuables secured Linens and cotton and cotton/polyester blend (less than 51% polyester) Personal oil-based products / skin lotions / body lotions removed Wigs or hairpieces removed NA Smoking or tobacco materials removed NA Books / newspapers / magazines / loose paper removed NA Cologne, aftershave, perfume and deodorant  removed Jewelry removed (may wrap wedding band) Make-up removed NA Hair care products removed Battery operated devices (external) removed NA Heating patches and chemical warmers removed NA Titanium eyewear removed NA Nail polish cured greater than 10 hours NA Casting  material cured greater than 10 hours NA Hearing aids removed NA Loose dentures or partials removed NA Prosthetics have been removed NA Patient demonstrates correct use of air break device (if applicable) Patient concerns have been addressed Patient grounding bracelet on and cord attached to chamber Specifics for Inpatients (complete in addition to above) Medication sheet sent with patient Intravenous medications needed or due during therapy sent with patient Drainage tubes (e.g. nasogastric tube or chest tube secured and vented) Endotracheal or Tracheotomy tube secured Cuff deflated of air and inflated with saline Airway suctioned Electronic Signature(s) Signed: 09/16/2021 1:59:04 PM By: Enedina Finner RCP, RRT, CHT Entered By: Enedina Finner on 09/16/2021 11:14:42

## 2021-09-16 NOTE — Progress Notes (Signed)
Charles [name], Charles [name] ([id number]) Visit Report for 09/16/2021 Arrival Information Details Patient Name: Charles [name], Charles [name] Date of Service: 09/16/2021 10:00 AM Medical Record Number: [id number] Patient Account Number: [id number] Date of Birth/Sex: [birth year] (61 y.o. M) Treating RN: Primary Care Aarush Stukey: Drema Dallas Other Clinician: Referring Alexanderia Gorby: Drema Dallas Treating Yamari Ventola/Extender: Skipper Cliche in Treatment: 9 Visit Information History Since Last Visit Added or deleted any medications: No Patient Arrived: Ambulatory Any new allergies or adverse reactions: No Arrival Time: 10:25 Had a fall or experienced change in No Accompanied By: self activities of daily living that may affect Transfer Assistance: None risk of falls: Patient Identification Verified: Yes Signs or symptoms of abuse/neglect since last visito No Secondary Verification Process Completed: Yes Hospitalized since last visit: No Patient Requires Transmission-Based Precautions: No Implantable device outside of the clinic excluding No Patient Has Alerts: Yes cellular tissue based products placed in the center Patient Alerts: DIABETIC since last visit: Has Dressing in Place as Prescribed: Yes Pain Present Now: No Electronic Signature(s) Signed: 09/16/2021 1:59:04 PM By: Enedina Finner RCP, RRT, CHT Entered By: Enedina Finner on 09/16/2021 11:12:35 Charles [name], Charles [name] ([id number]) -------------------------------------------------------------------------------- Encounter Discharge Information Details Patient Name: Charles [name] Date of Service: 09/16/2021 10:00 AM Medical Record Number: [id number] Patient Account Number: [id number] Date of Birth/Sex: 08/19/1960 (61 y.o. M) Treating RN: Primary Care Larri Brewton: Drema Dallas Other Clinician: Referring Cameo Schmiesing: Drema Dallas Treating Karsen Nakanishi/Extender: Skipper Cliche in Treatment: 9 Encounter Discharge Information  Items Discharge Condition: Stable Ambulatory Status: Ambulatory Discharge Destination: Home Transportation: Private Auto Accompanied By: self Schedule Follow-up Appointment: Yes Clinical Summary of Care: Notes Patient has an HBO treatment scheduled on 09/17/21 at 10:00 am. Electronic Signature(s) Signed: 09/16/2021 1:59:04 PM By: Enedina Finner RCP, RRT, CHT Entered By: Enedina Finner on 09/16/2021 13:52:18 Charles [name], Charles [name] ([id number]) -------------------------------------------------------------------------------- Vitals Details Patient Name: Charles [name] Date of Service: 09/16/2021 10:00 AM Medical Record Number: [id number] Patient Account Number: [id number] Date of Birth/Sex: [birth year] (61 y.o. M) Treating RN: Primary Care Luceal Hollibaugh: Drema Dallas Other Clinician: Referring Saphire Barnhart: Drema Dallas Treating Khylie Larmore/Extender: Skipper Cliche in Treatment: 9 Vital Signs Time Taken: 10:40 Temperature (F): 98.0 Weight (lbs): 230 Pulse (bpm): 72 Respiratory Rate (breaths/min): 18 Blood Pressure (mmHg): 142/90 Capillary Blood Glucose (mg/dl): 113 Reference Range: 80 - 120 mg / dl Electronic Signature(s) Signed: 09/16/2021 1:59:04 PM By: Enedina Finner RCP, RRT, CHT Entered By: Enedina Finner on 09/16/2021 11:13:30

## 2021-09-17 ENCOUNTER — Other Ambulatory Visit: Payer: Self-pay | Admitting: Physician Assistant

## 2021-09-17 ENCOUNTER — Other Ambulatory Visit: Payer: Self-pay | Admitting: Internal Medicine

## 2021-09-17 ENCOUNTER — Encounter (HOSPITAL_BASED_OUTPATIENT_CLINIC_OR_DEPARTMENT_OTHER): Payer: Managed Care, Other (non HMO) | Admitting: Internal Medicine

## 2021-09-17 DIAGNOSIS — E039 Hypothyroidism, unspecified: Secondary | ICD-10-CM

## 2021-09-17 DIAGNOSIS — E11621 Type 2 diabetes mellitus with foot ulcer: Secondary | ICD-10-CM

## 2021-09-17 DIAGNOSIS — F411 Generalized anxiety disorder: Secondary | ICD-10-CM

## 2021-09-17 DIAGNOSIS — L97522 Non-pressure chronic ulcer of other part of left foot with fat layer exposed: Secondary | ICD-10-CM

## 2021-09-17 DIAGNOSIS — F329 Major depressive disorder, single episode, unspecified: Secondary | ICD-10-CM

## 2021-09-17 DIAGNOSIS — M86672 Other chronic osteomyelitis, left ankle and foot: Secondary | ICD-10-CM | POA: Diagnosis not present

## 2021-09-17 LAB — GLUCOSE, CAPILLARY
Glucose-Capillary: 157 mg/dL — ABNORMAL HIGH (ref 70–99)
Glucose-Capillary: 141 mg/dL — ABNORMAL HIGH (ref 70–99)

## 2021-09-17 NOTE — Progress Notes (Signed)
NYKO, GELL ([id number]) Visit Report for 09/17/2021 HBO Details Patient Name: Charles [name], Charles [name] Date of Service: 09/17/2021 10:00 AM Medical Record Number: [id number] Patient Account Number: [id number] Date of Birth/Sex: Mar 14, 1961 (61 y.o. M) Treating RN: Primary Care Estevan Kersh: Drema Dallas Other Clinician: Jacqulyn Bath Referring Thanos Cousineau: Drema Dallas Treating Dealie Koelzer/Extender: Yaakov Guthrie in Treatment: 9 HBO Treatment Course Details Treatment Course Number: 1 Ordering Katricia Prehn: Jeri Cos Total Treatments Ordered: 40 HBO Treatment Start Date: 09/16/2021 HBO Indication: Chronic Refractory Osteomyelitis to Left 5th Ray Metatarsal Amputation Site HBO Treatment Details Treatment Number: 2 Patient Type: Outpatient Chamber Type: Monoplace Chamber Serial #: [id number] Treatment Protocol: 2.0 ATA with 90 minutes oxygen, and no air breaks Treatment Details Compression Rate Down: 1.5 psi / minute De-Compression Rate Up: 2.0 psi / minute Compress Tx Pressure Air breaks and breathing periods Decompress Decompress Begins Reached (leave unused spaces blank) Begins Ends Chamber Pressure (ATA) 1 2 - - - - - - 2 1 Clock Time (24 hr) 10:52 11:01 - - - - - - 12:31 12:39 Treatment Length: 107 (minutes) Treatment Segments: 4 Vital Signs Capillary Blood Glucose Reference Range: 80 - 120 mg / dl HBO Diabetic Blood Glucose Intervention Range: <131 mg/dl or >249 mg/dl Time Vitals Blood Respiratory Capillary Blood Glucose Pulse Action Type: Pulse: Temperature: Taken: Pressure: Rate: Glucose (mg/dl): Meter #: Oximetry (%) Taken: Pre 10:32 130/88 72 16 97.9 157 1 none per protocol Post 12:48 134/86 72 16 97.8 141 1 none per protocol Treatment Response Treatment Toleration: Well Treatment Completion Treatment Completed without Adverse Event Status: HBO Attestation I certify that I supervised this HBO treatment in accordance with Medicare guidelines. A trained  emergency response team is readily Yes available per hospital policies and procedures. Continue HBOT as ordered. Yes Electronic Signature(s) Signed: 09/17/2021 1:08:04 PM By: Zackery Barefoot, RRT, CHT Signed: 09/17/2021 2:06:32 PM By: Kalman Shan DO Previous Signature: 09/17/2021 1:03:26 PM Version By: Kalman Shan DO Entered By: Enedina Finner on 09/17/2021 13:06:42 Sereno, Nishant Chauncey Cruel ([id number]) -------------------------------------------------------------------------------- HBO Safety Checklist Details Patient Name: Charles [name] Date of Service: 09/17/2021 10:00 AM Medical Record Number: [id number] Patient Account Number: [id number] Date of Birth/Sex: [birth year] (61 y.o. M) Treating RN: Primary Care Clayvon Parlett: Drema Dallas Other Clinician: Jacqulyn Bath Referring Norissa Bartee: Drema Dallas Treating Elvis Boot/Extender: Yaakov Guthrie in Treatment: 9 HBO Safety Checklist Items Safety Checklist Consent Form Signed Patient voided / foley secured and emptied When did you last eato 10:00 am Last dose of injectable or oral agent n/a Ostomy pouch emptied and vented if applicable NA All implantable devices assessed, documented and approved NA Intravenous access site secured and place NA Valuables secured Linens and cotton and cotton/polyester blend (less than 51% polyester) Personal oil-based products / skin lotions / body lotions removed Wigs or hairpieces removed NA Smoking or tobacco materials removed NA Books / newspapers / magazines / loose paper removed NA Cologne, aftershave, perfume and deodorant removed Jewelry removed (may wrap wedding band) Make-up removed NA Hair care products removed Battery operated devices (external) removed NA Heating patches and chemical warmers removed NA Titanium eyewear removed NA Nail polish cured greater than 10 hours NA Casting material cured greater than 10 hours NA Hearing aids  removed NA Loose dentures or partials removed NA Prosthetics have been removed NA Patient demonstrates correct use of air break device (if applicable) Patient concerns have been addressed Patient grounding bracelet on and cord attached to chamber Specifics for Inpatients (complete in addition to above) Medication sheet  sent with patient Intravenous medications needed or due during therapy sent with patient Drainage tubes (e.g. nasogastric tube or chest tube secured and vented) Endotracheal or Tracheotomy tube secured Cuff deflated of air and inflated with saline Airway suctioned Electronic Signature(s) Signed: 09/17/2021 1:08:04 PM By: Enedina Finner RCP, RRT, CHT Entered By: Enedina Finner on 09/17/2021 11:08:01

## 2021-09-17 NOTE — Progress Notes (Signed)
ZALYN, AMEND ([id number]) Visit Report for 09/17/2021 Arrival Information Details Patient Name: SHI, BLANKENSHIP Date of Service: 09/17/2021 10:00 AM Medical Record Number: [id number] Patient Account Number: [id number] Date of Birth/Sex: 04-18-1961 (61 y.o. M) Treating RN: Primary Care Decarla Siemen: Drema Dallas Other Clinician: Jacqulyn Bath Referring Jannette Cotham: Drema Dallas Treating Haeleigh Streiff/Extender: Yaakov Guthrie in Treatment: 9 Visit Information History Since Last Visit Added or deleted any medications: No Patient Arrived: Ambulatory Any new allergies or adverse reactions: No Arrival Time: 10:25 Had a fall or experienced change in No Accompanied By: self activities of daily living that may affect Transfer Assistance: None risk of falls: Patient Identification Verified: Yes Signs or symptoms of abuse/neglect since last visito No Secondary Verification Process Completed: Yes Hospitalized since last visit: No Patient Requires Transmission-Based Precautions: No Implantable device outside of the clinic excluding No Patient Has Alerts: Yes cellular tissue based products placed in the center Patient Alerts: DIABETIC since last visit: Has Dressing in Place as Prescribed: Yes Pain Present Now: No Electronic Signature(s) Signed: 09/17/2021 1:08:04 PM By: Enedina Finner RCP, RRT, CHT Entered By: Enedina Finner on 09/17/2021 11:05:31 Radermacher, Gatlin Chauncey Cruel ([id number]) -------------------------------------------------------------------------------- Encounter Discharge Information Details Patient Name: [name] Date of Service: 09/17/2021 10:00 AM Medical Record Number: [id number] Patient Account Number: [id number] Date of Birth/Sex: February 09, 1961 (61 y.o. M) Treating RN: Primary Care Mone Commisso: Drema Dallas Other Clinician: Jacqulyn Bath Referring Kalyn Hofstra: Drema Dallas Treating Liandra Mendia/Extender: Yaakov Guthrie in  Treatment: 9 Encounter Discharge Information Items Discharge Condition: Stable Ambulatory Status: Ambulatory Discharge Destination: Home Transportation: Private Auto Accompanied By: self Schedule Follow-up Appointment: Yes Clinical Summary of Care: Notes Patient has an HBO treatment scheduled on 09/18/21 at 10:30 am. Electronic Signature(s) Signed: 09/17/2021 1:08:04 PM By: Enedina Finner RCP, RRT, CHT Entered By: Enedina Finner on 09/17/2021 13:07:36 Lichter, Rawleigh Chauncey Cruel ([id number]) -------------------------------------------------------------------------------- [place] Details Patient Name: [name] Date of Service: 09/17/2021 10:00 AM Medical Record Number: [id number] Patient Account Number: [id number] Date of Birth/Sex: 1960/12/30 (61 y.o. M) Treating RN: Primary Care Haniyah Maciolek: Drema Dallas Other Clinician: Jacqulyn Bath Referring Coulter Oldaker: Drema Dallas Treating Gianelle Mccaul/Extender: Yaakov Guthrie in Treatment: 9 Vital Signs Time Taken: 10:32 Temperature (F): 97.9 Weight (lbs): 230 Pulse (bpm): 72 Respiratory Rate (breaths/min): 16 Blood Pressure (mmHg): 130/88 Capillary Blood Glucose (mg/dl): 157 Reference Range: 80 - 120 mg / dl Electronic Signature(s) Signed: 09/17/2021 1:08:04 PM By: Enedina Finner RCP, RRT, CHT Entered By: Enedina Finner on 09/17/2021 11:06:45

## 2021-09-18 ENCOUNTER — Ambulatory Visit: Payer: Managed Care, Other (non HMO) | Attending: Infectious Diseases | Admitting: Infectious Diseases

## 2021-09-18 ENCOUNTER — Other Ambulatory Visit
Admission: RE | Admit: 2021-09-18 | Discharge: 2021-09-18 | Disposition: A | Discharge status: Home / Self Care | Payer: No Typology Code available for payment source | Attending: Infectious Diseases | Admitting: Infectious Diseases

## 2021-09-18 ENCOUNTER — Encounter: Payer: Self-pay | Admitting: Infectious Diseases

## 2021-09-18 ENCOUNTER — Encounter: Payer: Managed Care, Other (non HMO) | Attending: Physician Assistant | Admitting: Physician Assistant

## 2021-09-18 VITALS — BP 111/69 | HR 75 | Resp 16 | Ht 73.0 in | Wt 254.0 lb

## 2021-09-18 DIAGNOSIS — L97529 Non-pressure chronic ulcer of other part of left foot with unspecified severity: Secondary | ICD-10-CM | POA: Diagnosis not present

## 2021-09-18 DIAGNOSIS — T8744 Infection of amputation stump, left lower extremity: Secondary | ICD-10-CM

## 2021-09-18 DIAGNOSIS — L97522 Non-pressure chronic ulcer of other part of left foot with fat layer exposed: Secondary | ICD-10-CM | POA: Diagnosis not present

## 2021-09-18 DIAGNOSIS — R600 Localized edema: Secondary | ICD-10-CM | POA: Diagnosis not present

## 2021-09-18 DIAGNOSIS — Z89422 Acquired absence of other left toe(s): Secondary | ICD-10-CM | POA: Insufficient documentation

## 2021-09-18 DIAGNOSIS — L089 Local infection of the skin and subcutaneous tissue, unspecified: Secondary | ICD-10-CM | POA: Insufficient documentation

## 2021-09-18 DIAGNOSIS — M86172 Other acute osteomyelitis, left ankle and foot: Secondary | ICD-10-CM

## 2021-09-18 DIAGNOSIS — Z8639 Personal history of other endocrine, nutritional and metabolic disease: Secondary | ICD-10-CM | POA: Diagnosis not present

## 2021-09-18 DIAGNOSIS — M869 Osteomyelitis, unspecified: Secondary | ICD-10-CM | POA: Insufficient documentation

## 2021-09-18 DIAGNOSIS — Z89412 Acquired absence of left great toe: Secondary | ICD-10-CM

## 2021-09-18 DIAGNOSIS — Z9884 Bariatric surgery status: Secondary | ICD-10-CM | POA: Diagnosis not present

## 2021-09-18 DIAGNOSIS — I10 Essential (primary) hypertension: Secondary | ICD-10-CM | POA: Insufficient documentation

## 2021-09-18 DIAGNOSIS — I2510 Atherosclerotic heart disease of native coronary artery without angina pectoris: Secondary | ICD-10-CM | POA: Insufficient documentation

## 2021-09-18 DIAGNOSIS — E11621 Type 2 diabetes mellitus with foot ulcer: Secondary | ICD-10-CM | POA: Diagnosis present

## 2021-09-18 DIAGNOSIS — M86672 Other chronic osteomyelitis, left ankle and foot: Secondary | ICD-10-CM | POA: Diagnosis not present

## 2021-09-18 DIAGNOSIS — L97512 Non-pressure chronic ulcer of other part of right foot with fat layer exposed: Secondary | ICD-10-CM | POA: Insufficient documentation

## 2021-09-18 LAB — COMPREHENSIVE METABOLIC PANEL
ALT: 24 U/L (ref 0–44)
AST: 36 U/L (ref 15–41)
Albumin: 3.8 g/dL (ref 3.5–5.0)
Alkaline Phosphatase: 101 U/L (ref 38–126)
Anion gap: 6 (ref 5–15)
BUN: 23 mg/dL (ref 8–23)
CO2: 28 mmol/L (ref 22–32)
Calcium: 9.2 mg/dL (ref 8.9–10.3)
Chloride: 104 mmol/L (ref 98–111)
Creatinine, Ser: 0.98 mg/dL (ref 0.61–1.24)
GFR, Estimated: >60 mL/min (ref >60)
Glucose, Bld: 129 mg/dL — ABNORMAL HIGH (ref 70–99)
Potassium: 4.3 mmol/L (ref 3.5–5.1)
Sodium: 138 mmol/L (ref 135–145)
Total Bilirubin: 0.8 mg/dL (ref 0.3–1.2)
Total Protein: 7.5 g/dL (ref 6.5–8.1)

## 2021-09-18 LAB — SEDIMENTATION RATE: Sed Rate: 37 mm/hr — ABNORMAL HIGH (ref 0–20)

## 2021-09-18 LAB — GLUCOSE, CAPILLARY
Glucose-Capillary: 161 mg/dL — ABNORMAL HIGH (ref 70–99)
Glucose-Capillary: 90 mg/dL (ref 70–99)

## 2021-09-18 LAB — CBC WITH DIFFERENTIAL/PLATELET
Abs Immature Granulocytes: 0.02 10*3/uL (ref 0.00–0.07)
Basophils Absolute: 0 10*3/uL (ref 0.0–0.1)
Basophils Relative: 1 %
Eosinophils Absolute: 0.4 10*3/uL (ref 0.0–0.5)
Eosinophils Relative: 7 %
HCT: 38.5 % — ABNORMAL LOW (ref 39.0–52.0)
Hemoglobin: 12.1 g/dL — ABNORMAL LOW (ref 13.0–17.0)
Immature Granulocytes: 0 %
Lymphocytes Relative: 24 %
Lymphs Abs: 1.4 10*3/uL (ref 0.7–4.0)
MCH: 27.8 pg (ref 26.0–34.0)
MCHC: 31.4 g/dL (ref 30.0–36.0)
MCV: 88.5 fL (ref 80.0–100.0)
Monocytes Absolute: 0.5 10*3/uL (ref 0.1–1.0)
Monocytes Relative: 10 %
Neutro Abs: 3.3 10*3/uL (ref 1.7–7.7)
Neutrophils Relative %: 58 %
Platelets: 282 10*3/uL (ref 150–400)
RBC: 4.35 MIL/uL (ref 4.22–5.81)
RDW: 14.6 % (ref 11.5–15.5)
WBC: 5.6 10*3/uL (ref 4.0–10.5)
nRBC: 0 % (ref 0.0–0.2)

## 2021-09-18 LAB — C-REACTIVE PROTEIN: CRP: 2.2 mg/dL — ABNORMAL HIGH (ref <1.0)

## 2021-09-18 NOTE — Progress Notes (Signed)
AUDI, WETTSTEIN ([id number]) Visit Report for 09/18/2021 Problem List Details Patient Name: Charles [name], Charles [name] Date of Service: 09/18/2021 10:00 AM Medical Record Number: [id number] Patient Account Number: [id number] Date of Birth/Sex: 08-13-1960 (61 y.o. M) Treating RN: Primary Care Provider: Drema Dallas Other Clinician: Jacqulyn Bath Referring Provider: Drema Dallas Treating Provider/Extender: Skipper Cliche in Treatment: 9 Active Problems ICD-10 Encounter Code Description Active Date MDM Diagnosis E11.621 Type 2 diabetes mellitus with foot ulcer 07/15/2021 No Yes M86.672 Other chronic osteomyelitis, left ankle and foot 09/04/2021 No Yes L97.522 Non-pressure chronic ulcer of other part of left foot with fat layer 07/24/2021 No Yes exposed L97.512 Non-pressure chronic ulcer of other part of right foot with fat layer 07/24/2021 No Yes exposed Willard (primary) hypertension 07/15/2021 No Yes I25.10 Atherosclerotic heart disease of native coronary artery without angina 07/15/2021 No Yes pectoris Inactive Problems Resolved Problems Electronic Signature(s) Signed: 09/18/2021 4:24:25 PM By: Worthy Keeler PA-C Entered By: Worthy Keeler on 09/18/2021 16:24:25 Neary, Saadiq S. ([id number]) -------------------------------------------------------------------------------- [place] Details Patient Name: [name] Date of Service: 09/18/2021 Medical Record Number: [id number] Patient Account Number: [id number] Date of Birth/Sex: 1960-07-02 (61 y.o. M) Treating RN: Primary Care Provider: Drema Dallas Other Clinician: Jacqulyn Bath Referring Provider: Drema Dallas Treating Provider/Extender: Skipper Cliche in Treatment: 9 Diagnosis Coding ICD-10 Codes Code Description E11.621 Type 2 diabetes mellitus with foot ulcer M86.672 Other chronic osteomyelitis, left ankle and foot L97.522 Non-pressure chronic ulcer of other part of left foot with fat layer  exposed L97.512 Non-pressure chronic ulcer of other part of right foot with fat layer exposed I10 Essential (primary) hypertension I25.10 Atherosclerotic heart disease of native coronary artery without angina pectoris Facility Procedures CPT4 Code: 67341937 Description: (Facility Use Only) HBOT, full body chamber, 38mn Modifier: Quantity: 4 Physician Procedures CPT4 Code: 69024097Description: 935329- WC PHYS HYPERBARIC OXYGEN THERAPY Modifier: Quantity: 1 CPT4 Code: Description: ICD-10 Diagnosis Description M86.672 Other chronic osteomyelitis, left ankle and foot L97.522 Non-pressure chronic ulcer of other part of left foot with fat layer expos E11.621 Type 2 diabetes mellitus with foot ulcer Modifier: ed Quantity: Electronic Signature(s) Signed: 09/18/2021 4:25:55 PM By: SWorthy KeelerPA-C Previous Signature: 09/18/2021 1:40:17 PM Version By: WEnedina FinnerRCP, RRT, CHT Previous Signature: 09/18/2021 4:22:20 PM Version By: SWorthy KeelerPA-C Entered By: SWorthy Keeleron 09/18/2021 16:25:55
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
## 2021-09-18 NOTE — Progress Notes (Addendum)
Charles [name] ([id number]) Visit Report for 09/18/2021 Arrival Information Details Patient Name: Charles [name], Charles [name] Date of Service: 09/18/2021 10:00 AM Medical Record Number: [id number] Patient Account Number: [id number] Date of Birth/Sex: 02-Oct-1960 (61 y.o. M) Treating RN: Primary Care Theadore Blunck: Drema Dallas Other Clinician: Jacqulyn Bath Referring Diane Mochizuki: Drema Dallas Treating Gervis Gaba/Extender: Skipper Cliche in Treatment: 9 Visit Information History Since Last Visit Added or deleted any medications: No Patient Arrived: Ambulatory Any new allergies or adverse reactions: No Arrival Time: 10:30 Had a fall or experienced change in No Accompanied By: self activities of daily living that may affect Transfer Assistance: None risk of falls: Patient Identification Verified: Yes Signs or symptoms of abuse/neglect since last visito No Secondary Verification Process Completed: Yes Hospitalized since last visit: No Patient Requires Transmission-Based Precautions: No Implantable device outside of the clinic excluding No Patient Has Alerts: Yes cellular tissue based products placed in the center Patient Alerts: DIABETIC since last visit: Has Dressing in Place as Prescribed: Yes Pain Present Now: No Electronic Signature(s) Signed: 09/18/2021 1:40:17 PM By: Enedina Finner RCP, RRT, CHT Entered By: Enedina Finner on 09/18/2021 11:01:48 Esquibel, Norbert Chauncey Cruel ([id number]) -------------------------------------------------------------------------------- Encounter Discharge Information Details Patient Name: Charles [name] Date of Service: 09/18/2021 10:00 AM Medical Record Number: [id number] Patient Account Number: [id number] Date of Birth/Sex: 1960/11/05 (61 y.o. M) Treating RN: Primary Care Letita Prentiss: Drema Dallas Other Clinician: Jacqulyn Bath Referring Anitra Doxtater: Drema Dallas Treating Arliss Frisina/Extender: Skipper Cliche in Treatment:  9 Encounter Discharge Information Items Discharge Condition: Stable Ambulatory Status: Ambulatory Discharge Destination: Home Transportation: Private Auto Accompanied By: self Schedule Follow-up Appointment: Yes Clinical Summary of Care: Notes Patient has an HBO treatment scheduled on 09/19/21 at 10:30 am. Electronic Signature(s) Signed: 09/18/2021 2:14:25 PM By: Enedina Finner RCP, RRT, CHT Previous Signature: 09/18/2021 1:40:17 PM Version By: Enedina Finner RCP, RRT, CHT Entered By: Enedina Finner on 09/18/2021 14:14:08 Randleman, Habib Chauncey Cruel ([id number]) -------------------------------------------------------------------------------- Vitals Details Patient Name: Charles [name] Date of Service: 09/18/2021 10:00 AM Medical Record Number: [id number] Patient Account Number: [id number] Date of Birth/Sex: Apr 27, 1960 (61 y.o. M) Treating RN: Primary Care Keylen Uzelac: Drema Dallas Other Clinician: Jacqulyn Bath Referring Yariel Ferraris: Drema Dallas Treating Brevyn Ring/Extender: Skipper Cliche in Treatment: 9 Vital Signs Time Taken: 10:38 Temperature (F): 97.8 Weight (lbs): 230 Pulse (bpm): 78 Respiratory Rate (breaths/min): 16 Blood Pressure (mmHg): 134/78 Capillary Blood Glucose (mg/dl): 161 Reference Range: 80 - 120 mg / dl Electronic Signature(s) Signed: 09/18/2021 1:40:17 PM By: Enedina Finner RCP, RRT, CHT Entered By: Enedina Finner on 09/18/2021 11:03:19

## 2021-09-18 NOTE — Progress Notes (Signed)
Charles [name], Charles [name] ([id number]) Visit Report for 09/18/2021 HBO Details Patient Name: Charles [name] Date of Service: 09/18/2021 10:00 AM Medical Record Number: [id number] Patient Account Number: [id number] Date of Birth/Sex: 1961-02-21 (61 y.o. 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 HBO Treatment Details Treatment Number: 3 Patient Type: Outpatient Chamber Type: Monoplace Chamber Serial #: [id number] Treatment Protocol: 2.0 ATA with 90 minutes oxygen, and no air breaks Treatment Details Compression Rate Down: 1.0 psi / minute De-Compression Rate Up: 1.5 psi / minute Compress Tx Pressure Air breaks and breathing periods Decompress Decompress Begins Reached (leave unused spaces blank) Begins Ends Chamber Pressure (ATA) 1 2 - - - - - - 2 1 Clock Time (24 hr) 10:48 11:02 - - - - - - 12:33 12:43 Treatment Length: 115 (minutes) Treatment Segments: 4 Vital Signs Capillary Blood Glucose Reference Range: 80 - 120 mg / dl HBO Diabetic Blood Glucose Intervention Range: <131 mg/dl or >249 mg/dl Time Capillary Blood Glucose Pulse Blood Respiratory Action Type: Vitals Pulse: Temperature: Glucose Meter Oximetry Pressure: Rate: Taken: Taken: (mg/dl): #: (%) Pre 10:38 134/78 78 16 97.8 161 1 none per protocol none per PA; patient released to Post 12:50 130/92 72 16 98.1 90 1 home Pre-Treatment Ear Evaluation Left Right Clear: No Clear: Yes Intact: Yes Intact: Yes Color: red Color: Grey PE Tubes inserted: No PE Tubes inserted: No Irrigated: No Irrigated: No Myringotomy performed:  No Myringotomy performed: No Left Teed Scale: Grade I Right Teed Scale: Grade 0 Treatment Response Treatment Toleration: Well Treatment Completion Treatment Completed without Adverse Event Status: Charles [name] Notes I am having the patient utilize Afrin for the next 3-4 days to try to clear his ear. If he continues to have issues and it does not clear we may need to have him see ENT but I am hopeful to avoid that. Post Treatment Teed Score [name], [name] ([id number]) Post Treatment Teed Score: Left Ear Grade I Post Treatment Teed Score: Right Ear Grade 0 HBO Attestation I certify that I supervised this HBO treatment in accordance with Medicare guidelines. A trained emergency response team is readily Yes available per hospital policies and procedures. Continue HBOT as ordered. Yes Electronic Signature(s) Signed: 09/18/2021 4:25:50 PM By: Worthy Keeler PA-C Previous Signature: 09/18/2021 2:14:25 PM Version By: Enedina Finner RCP, RRT, CHT Previous Signature: 09/18/2021 4:22:20 PM Version By: Worthy Keeler PA-C Previous Signature: 09/18/2021 1:40:17 PM Version By: Enedina Finner RCP, RRT, CHT Entered By: Worthy Keeler on 09/18/2021 16:25:50 Charles [name] ([id number]) -------------------------------------------------------------------------------- HBO Safety Checklist Details Patient Name: Charles [name] Date of Service: 09/18/2021 10:00 AM Medical Record Number: [id number] Patient Account Number: [id number] Date of Birth/Sex: 10/28/1960 (61 y.o. M) Treating RN: Primary Care Anylah Scheib: Drema Dallas Other Clinician: Jacqulyn Bath Referring Janelys Glassner: Drema Dallas Treating Rayanne Padmanabhan/Extender: Skipper Cliche in Treatment: 9 HBO Safety Checklist Items Safety Checklist Consent Form Signed Patient voided / foley secured and emptied When did you last eato 08:00 am Last dose of injectable or oral agent n/a Ostomy pouch emptied and vented if  applicable NA All implantable devices assessed, documented and approved NA Intravenous access site secured and place NA Valuables secured Linens and cotton and cotton/polyester blend (  less than 51% polyester) Personal oil-based products / skin lotions / body lotions removed Wigs or hairpieces removed NA Smoking or tobacco materials removed NA Books / newspapers / magazines / loose paper removed NA Cologne, aftershave, perfume and deodorant removed Jewelry removed (may wrap wedding band) Make-up removed NA Hair care products removed Battery operated devices (external) removed NA Heating patches and chemical warmers removed NA Titanium eyewear removed NA Nail polish cured greater than 10 hours NA Casting material cured greater than 10 hours NA Hearing aids removed NA Loose dentures or partials removed NA Prosthetics have been removed NA Patient demonstrates correct use of air break device (if applicable) Patient concerns have been addressed Patient grounding bracelet on and cord attached to chamber Specifics for Inpatients (complete in addition to above) Medication sheet sent with patient Intravenous medications needed or due during therapy sent with patient Drainage tubes (e.g. nasogastric tube or chest tube secured and vented) Endotracheal or Tracheotomy tube secured Cuff deflated of air and inflated with saline Airway suctioned Electronic Signature(s) Signed: 09/18/2021 1:40:17 PM By: Enedina Finner RCP, RRT, CHT Entered By: Enedina Finner on 09/18/2021 11:04:55

## 2021-09-18 NOTE — Patient Instructions (Signed)
You are here for follow up of the left foot infection- you are taking amoxicillin 1 gram three times a day and you have 2 more weeks- will extend by another 2 weeks if needed after the blood test today- foot is swollen but the surgical site is looking better. You are getting hyerbaric oxygen. Follow up 1 month+

## 2021-09-19 ENCOUNTER — Encounter: Payer: Managed Care, Other (non HMO) | Admitting: Physician Assistant

## 2021-09-19 DIAGNOSIS — E11621 Type 2 diabetes mellitus with foot ulcer: Secondary | ICD-10-CM | POA: Diagnosis not present

## 2021-09-19 LAB — GLUCOSE, CAPILLARY
Glucose-Capillary: 142 mg/dL — ABNORMAL HIGH (ref 70–99)
Glucose-Capillary: 118 mg/dL — ABNORMAL HIGH (ref 70–99)
Glucose-Capillary: 165 mg/dL — ABNORMAL HIGH (ref 70–99)

## 2021-09-19 NOTE — Progress Notes (Signed)
ROBIN, PAFFORD ([id number]) Visit Report for 09/19/2021 HBO Details Patient Name: Charles, [name] Date of Service: 09/19/2021 10:00 AM Medical Record Number: [id number] Patient Account Number: [id number] Date of Birth/Sex: [birth year] (61 y.o. 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 HBO Treatment Details Treatment Number: 4 Patient Type: Outpatient Chamber Type: Monoplace Chamber Serial #: [id number] Treatment Protocol: 2.0 ATA with 90 minutes oxygen, and no air breaks Treatment Details Compression Rate Down: 1.5 psi / minute De-Compression Rate Up: 1.5 psi / minute Compress Tx Pressure Air breaks and breathing periods Decompress Decompress Begins Reached (leave unused spaces blank) Begins Ends Chamber Pressure (ATA) 1 2 - - - - - - 2 1 Clock Time (24 hr) 10:47 10:57 - - - - - - 12:26 12:36 Treatment Length: 109 (minutes) Treatment Segments: 4 Vital Signs Capillary Blood Glucose Reference Range: 80 - 120 mg / dl HBO Diabetic Blood Glucose Intervention Range: <131 mg/dl or >249 mg/dl Time Vitals Blood Respiratory Capillary Blood Glucose Pulse Action Type: Pulse: Temperature: Taken: Pressure: Rate: Glucose (mg/dl): Meter #: Oximetry (%) Taken: Pre 10:40 150/84 72 16 98.4 142 1 none per protocol Post 12:48 144/84 60 16 98 118 1 none per protocol Pre-Treatment Ear Evaluation Left Right Left Teed Scale: Grade I Right Teed Scale: Grade 0 Treatment Response Treatment Toleration: Well Treatment Completion Treatment Completed without Adverse Event Status: HBO Attestation I certify that I  supervised this HBO treatment in accordance with Medicare guidelines. A trained emergency response team is readily Yes available per hospital policies and procedures. Electronic Signature(s) Signed: 09/19/2021 4:36:04 PM By: Worthy Keeler PA-C Previous Signature: 09/19/2021 1:54:34 PM Version By: Enedina Finner RCP, RRT, CHT Entered By: Worthy Keeler on 09/19/2021 16:36:04 [name], [name] ([id number]) -------------------------------------------------------------------------------- HBO Safety Checklist Details Patient Name: Charles [name] Date of Service: 09/19/2021 10:00 AM Medical Record Number: [id number] Patient Account Number: [id number] Date of Birth/Sex: 25-Aug-1960 (61 y.o. M) Treating RN: Primary Care Jahlia Omura: Drema Dallas Other Clinician: Jacqulyn Bath Referring Ernan Runkles: Drema Dallas Treating Marcques Wrightsman/Extender: Skipper Cliche in Treatment: 9 HBO Safety Checklist Items Safety Checklist Consent Form Signed Patient voided / foley secured and emptied When did you last eato 08:00 am Last dose of injectable or oral agent n/a Ostomy pouch emptied and vented if applicable NA All implantable devices assessed, documented and approved NA Intravenous access site secured and place NA Valuables secured Linens and cotton and cotton/polyester blend (less than 51% polyester) Personal oil-based products / skin lotions / body lotions removed Wigs or hairpieces removed NA Smoking or tobacco materials removed NA Books / newspapers / magazines / loose paper removed NA Cologne, aftershave, perfume and deodorant removed Jewelry removed (may wrap wedding band) Make-up removed NA Hair care products removed Battery operated devices (external) removed NA Heating patches and chemical warmers removed NA Titanium eyewear removed NA Nail polish cured greater than 10 hours NA Casting material cured greater than 10 hours NA Hearing aids removed NA Loose  dentures or partials removed NA Prosthetics have been removed NA Patient demonstrates correct use of air break device (if applicable) Patient concerns have been addressed Patient grounding bracelet on and cord attached to chamber Specifics for Inpatients (complete in addition to  above) Medication sheet sent with patient Intravenous medications needed or due during therapy sent with patient Drainage tubes (e.g. nasogastric tube or chest tube secured and vented) Endotracheal or Tracheotomy tube secured Cuff deflated of air and inflated with saline Airway suctioned Electronic Signature(s) Signed: 09/19/2021 1:54:34 PM By: Enedina Finner RCP, RRT, CHT Entered By: Enedina Finner on 09/19/2021 11:13:52

## 2021-09-19 NOTE — Progress Notes (Signed)
Charles [name] ([id number]) Visit Report for 09/19/2021 Arrival Information Details Patient Name: Charles, [name] Date of Service: 09/19/2021 10:00 AM Medical Record Number: [id number] Patient Account Number: [id number] Date of Birth/Sex: 19-Mar-1961 (61 y.o. M) Treating RN: Primary Care Katie Faraone: Drema Dallas Other Clinician: Jacqulyn Bath Referring Eli Pattillo: Drema Dallas Treating Ely Ballen/Extender: Skipper Cliche in Treatment: 9 Visit Information History Since Last Visit Added or deleted any medications: No Patient Arrived: Ambulatory Any new allergies or adverse reactions: No Arrival Time: 10:30 Had a fall or experienced change in No Accompanied By: self activities of daily living that may affect Transfer Assistance: None risk of falls: Patient Identification Verified: Yes Signs or symptoms of abuse/neglect since last visito No Secondary Verification Process Completed: Yes Hospitalized since last visit: No Patient Requires Transmission-Based Precautions: No Implantable device outside of the clinic excluding No Patient Has Alerts: Yes cellular tissue based products placed in the center Patient Alerts: DIABETIC since last visit: Has Dressing in Place as Prescribed: Yes Pain Present Now: No Electronic Signature(s) Signed: 09/19/2021 1:54:34 PM By: Enedina Finner RCP, RRT, CHT Entered By: Enedina Finner on 09/19/2021 11:11:54 Charles [name] ([id number]) -------------------------------------------------------------------------------- Encounter Discharge Information Details Patient Name: Charles [name] Date of Service: 09/19/2021 10:00 AM Medical Record Number: [id number] Patient Account Number: [id number] Date of Birth/Sex: [birth year] (61 y.o. M) Treating RN: Primary Care Nohemy Koop: Drema Dallas Other Clinician: Jacqulyn Bath Referring Zellie Jenning: Drema Dallas Treating Sher Shampine/Extender: Skipper Cliche in Treatment:  9 Encounter Discharge Information Items Discharge Condition: Stable Ambulatory Status: Ambulatory Discharge Destination: Home Transportation: Private Auto Accompanied By: self Schedule Follow-up Appointment: Yes Clinical Summary of Care: Notes Patient has an HBO treatment scheduled on 09/22/21 at 10:30 am. Electronic Signature(s) Signed: 09/19/2021 1:54:34 PM By: Enedina Finner RCP, RRT, CHT Entered By: Enedina Finner on 09/19/2021 13:52:06 Charles [name] ([id number]) -------------------------------------------------------------------------------- Vitals Details Patient Name: Charles [name] Date of Service: 09/19/2021 10:00 AM Medical Record Number: [id number] Patient Account Number: [id number] Date of Birth/Sex: 16-Jan-1961 (61 y.o. M) Treating RN: Primary Care Alyzza Andringa: Drema Dallas Other Clinician: Jacqulyn Bath Referring Remus Hagedorn: Drema Dallas Treating Ailie Gage/Extender: Skipper Cliche in Treatment: 9 Vital Signs Time Taken: 10:40 Temperature (F): 98.4 Weight (lbs): 230 Pulse (bpm): 72 Respiratory Rate (breaths/min): 16 Blood Pressure (mmHg): 150/84 Capillary Blood Glucose (mg/dl): 142 Reference Range: 80 - 120 mg / dl Electronic Signature(s) Signed: 09/19/2021 1:54:34 PM By: Enedina Finner RCP, RRT, CHT Entered By: Enedina Finner on 09/19/2021 11:12:40

## 2021-09-19 NOTE — Progress Notes (Signed)
ARAM, DOMZALSKI ([id number]) Visit Report for 09/19/2021 Problem List Details Patient Name: Charles [name], Charles [name] Date of Service: 09/19/2021 10:00 AM Medical Record Number: [id number] Patient Account Number: [id number] Date of Birth/Sex: 1960-05-05 (61 y.o. M) Treating RN: Primary Care Provider: Drema Dallas Other Clinician: Jacqulyn Bath Referring Provider: Drema Dallas Treating Provider/Extender: Skipper Cliche in Treatment: 9 Active Problems ICD-10 Encounter Code Description Active Date MDM Diagnosis E11.621 Type 2 diabetes mellitus with foot ulcer 07/15/2021 No Yes M86.672 Other chronic osteomyelitis, left ankle and foot 09/04/2021 No Yes L97.522 Non-pressure chronic ulcer of other part of left foot with fat layer 07/24/2021 No Yes exposed L97.512 Non-pressure chronic ulcer of other part of right foot with fat layer 07/24/2021 No Yes exposed [place] (primary) hypertension 07/15/2021 No Yes I25.10 Atherosclerotic heart disease of native coronary artery without angina 07/15/2021 No Yes pectoris Inactive Problems Resolved Problems Electronic Signature(s) Signed: 09/19/2021 4:35:38 PM By: Worthy Keeler PA-C Entered By: Worthy Keeler on 09/19/2021 16:35:37 Charles [name], [name] ([id number]) -------------------------------------------------------------------------------- [place] Details Patient Name: Charles [name] Date of Service: 09/19/2021 Medical Record Number: [id number] Patient Account Number: [id number] Date of Birth/Sex: 1960-04-23 (61 y.o. M) Treating RN: Primary Care Provider: Drema Dallas Other Clinician: Jacqulyn Bath Referring Provider: Drema Dallas Treating Provider/Extender: Skipper Cliche in Treatment: 9 Diagnosis Coding ICD-10 Codes Code Description E11.621 Type 2 diabetes mellitus with foot ulcer M86.672 Other chronic osteomyelitis, left ankle and foot L97.522 Non-pressure chronic ulcer of other part of left foot with fat layer  exposed L97.512 Non-pressure chronic ulcer of other part of right foot with fat layer exposed I10 Essential (primary) hypertension I25.10 Atherosclerotic heart disease of native coronary artery without angina pectoris Facility Procedures CPT4 Code: 61950932 Description: (Facility Use Only) HBOT, full body chamber, 37mn Modifier: Quantity: 4 Physician Procedures CPT4 Code: 66712458Description: 909983- WC PHYS HYPERBARIC OXYGEN THERAPY Modifier: Quantity: 1 CPT4 Code: Description: ICD-10 Diagnosis Description M86.672 Other chronic osteomyelitis, left ankle and foot E11.621 Type 2 diabetes mellitus with foot ulcer L97.522 Non-pressure chronic ulcer of other part of left foot with fat layer expos Modifier: ed Quantity: Electronic Signature(s) Signed: 09/19/2021 4:36:13 PM By: SWorthy KeelerPA-C Previous Signature: 09/19/2021 1:54:34 PM Version By: WEnedina FinnerRCP, RRT, CHT Entered By: SWorthy Keeleron 09/19/2021 16:36:13

## 2021-09-22 ENCOUNTER — Encounter: Payer: Managed Care, Other (non HMO) | Admitting: Physician Assistant

## 2021-09-22 DIAGNOSIS — E11621 Type 2 diabetes mellitus with foot ulcer: Secondary | ICD-10-CM | POA: Diagnosis not present

## 2021-09-22 LAB — GLUCOSE, CAPILLARY
Glucose-Capillary: 157 mg/dL — ABNORMAL HIGH (ref 70–99)
Glucose-Capillary: 134 mg/dL — ABNORMAL HIGH (ref 70–99)

## 2021-09-22 NOTE — Progress Notes (Signed)
COLLIN, RENGEL ([id number]) Visit Report for 09/22/2021 Problem List Details Patient Name: Charles [name], Charles [name] Date of Service: 09/22/2021 10:00 AM Medical Record Number: [id number] Patient Account Number: [id number] Date of Birth/Sex: [birth year] (61 y.o. M) Treating RN: Levora Dredge Primary Care Provider: Drema Dallas Other Clinician: Donavan Burnet Referring Provider: Drema Dallas Treating Provider/Extender: Skipper Cliche in Treatment: 9 Active Problems ICD-10 Encounter Code Description Active Date MDM Diagnosis E11.621 Type 2 diabetes mellitus with foot ulcer 07/15/2021 No Yes M86.672 Other chronic osteomyelitis, left ankle and foot 09/04/2021 No Yes L97.522 Non-pressure chronic ulcer of other part of left foot with fat layer 07/24/2021 No Yes exposed L97.512 Non-pressure chronic ulcer of other part of right foot with fat layer 07/24/2021 No Yes exposed [place] (primary) hypertension 07/15/2021 No Yes I25.10 Atherosclerotic heart disease of native coronary artery without angina 07/15/2021 No Yes pectoris Inactive Problems Resolved Problems Electronic Signature(s) Signed: 09/22/2021 4:20:38 PM By: Worthy Keeler PA-C Entered By: Worthy Keeler on 09/22/2021 16:20:38 [name], [name] ([id number]) -------------------------------------------------------------------------------- [place] Details Patient Name: Charles [name] Date of Service: 09/22/2021 Medical Record Number: [id number] Patient Account Number: [id number] Date of Birth/Sex: 04/28/1960 (61 y.o. M) Treating RN: Levora Dredge Primary Care Provider: Drema Dallas Other Clinician: Donavan Burnet Referring Provider: Drema Dallas Treating Provider/Extender: Skipper Cliche in Treatment: 9 Diagnosis Coding ICD-10 Codes Code Description E11.621 Type 2 diabetes mellitus with foot ulcer M86.672 Other chronic osteomyelitis, left ankle and foot L97.522 Non-pressure chronic ulcer of other  part of left foot with fat layer exposed L97.512 Non-pressure chronic ulcer of other part of right foot with fat layer exposed I10 Essential (primary) hypertension I25.10 Atherosclerotic heart disease of native coronary artery without angina pectoris Facility Procedures CPT4 Code: 82423536 Description: (Facility Use Only) HBOT, full body chamber, 82mn Modifier: Quantity: 4 CPT4 Code: Description: ICD-10 Diagnosis Description [id number] chronic osteomyelitis, left ankle and foot L97.522 Non-pressure chronic ulcer of other part of left foot with fat layer expos E11.621 Type 2 diabetes mellitus with foot ulcer Modifier: ed Quantity: Physician Procedures CPT4 Code: 64008676Description: 919509- WC PHYS HYPERBARIC OXYGEN THERAPY Modifier: Quantity: 1 CPT4 Code: Description: ICD-10 Diagnosis Description M86.672 Other chronic osteomyelitis, left ankle and foot L97.522 Non-pressure chronic ulcer of other part of left foot with fat layer expos E11.621 Type 2 diabetes mellitus with foot ulcer Modifier: ed Quantity: Electronic Signature(s) Signed: 09/22/2021 4:21:31 PM By: SWorthy KeelerPA-C Previous Signature: 09/22/2021 2:17:59 PM Version By: SDonavan BurnetCHT, EMT, BS Previous Signature: 09/22/2021 4:19:54 PM Version By: SWorthy KeelerPA-C Entered By: SWorthy Keeleron 09/22/2021 16:21:31

## 2021-09-22 NOTE — Progress Notes (Addendum)
Charles, [name] ([id number]) Visit Report for 09/22/2021 Arrival Information Details Patient Name: Charles [name], Charles [name] Date of Service: 09/22/2021 10:00 AM Medical Record Number: [id number] Patient Account Number: [id number] Date of Birth/Sex: December 29, 1960 (61 y.o. M) Treating RN: Levora Dredge Primary Care Cailie Bosshart: Drema Dallas Other Clinician: Donavan Burnet Referring Vlasta Baskin: Drema Dallas Treating Mehak Roskelley/Extender: Skipper Cliche in Treatment: 9 Visit Information History Since Last Visit All ordered tests and consults were completed: Yes Patient Arrived: Ambulatory Added or deleted any medications: No Arrival Time: 10:22 Any new allergies or adverse reactions: No Accompanied By: self Had a fall or experienced change in No Transfer Assistance: None activities of daily living that may affect Patient Identification Verified: Yes risk of falls: Secondary Verification Process Completed: Yes Signs or symptoms of abuse/neglect since last visito No Patient Requires Transmission-Based Precautions: No Hospitalized since last visit: No Patient Has Alerts: Yes Implantable device outside of the clinic excluding No Patient Alerts: DIABETIC cellular tissue based products placed in the center since last visit: Pain Present Now: No Electronic Signature(s) Signed: 09/22/2021 11:08:14 AM By: Donavan Burnet CHT, EMT, BS Entered By: Donavan Burnet on 09/22/2021 11:08:14 Dhami, Jerami Chauncey Cruel ([id number]) -------------------------------------------------------------------------------- Encounter Discharge Information Details Patient Name: Charles [name] Date of Service: 09/22/2021 10:00 AM Medical Record Number: [id number] Patient Account Number: [id number] Date of Birth/Sex: November 24, 1960 (61 y.o. M) Treating RN: Levora Dredge Primary Care Lavera Vandermeer: Drema Dallas Other Clinician: Donavan Burnet Referring Verne Cove: Drema Dallas Treating Raffi Milstein/Extender: Skipper Cliche in Treatment: 9 Encounter Discharge Information Items Discharge Condition: Stable Ambulatory Status: Ambulatory Discharge Destination: Home Transportation: Private Auto Accompanied By: self Schedule Follow-up Appointment: No Clinical Summary of Care: Electronic Signature(s) Signed: 09/22/2021 2:18:22 PM By: Donavan Burnet CHT, EMT, BS Entered By: Donavan Burnet on 09/22/2021 14:18:22 Clum, Charlton Chauncey Cruel ([id number]) -------------------------------------------------------------------------------- Vitals Details Patient Name: Charles [name] Date of Service: 09/22/2021 10:00 AM Medical Record Number: [id number] Patient Account Number: [id number] Date of Birth/Sex: 1960/05/09 (61 y.o. M) Treating RN: Levora Dredge Primary Care Masin Shatto: Drema Dallas Other Clinician: Donavan Burnet Referring Makenzie Vittorio: Drema Dallas Treating Keyion Knack/Extender: Skipper Cliche in Treatment: 9 Vital Signs Time Taken: 10:22 Temperature (F): 98.2 Weight (lbs): 230 Pulse (bpm): 78 Respiratory Rate (breaths/min): 14 Blood Pressure (mmHg): 142/60 Capillary Blood Glucose (mg/dl): 157 Reference Range: 80 - 120 mg / dl Electronic Signature(s) Signed: 09/22/2021 11:09:13 AM By: Donavan Burnet CHT, EMT, BS Entered By: Donavan Burnet on 09/22/2021 11:09:12

## 2021-09-22 NOTE — Progress Notes (Addendum)
ABDIRAHIM, FLAVELL ([id number]) Visit Report for 09/22/2021 HBO Details Patient Name: MARIN, MILLEY Date of Service: 09/22/2021 10:00 AM Medical Record Number: [id number] Patient Account Number: [id number] Date of Birth/Sex: [birth year] (61 y.o. 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 HBO Treatment Details Treatment Number: 5 Patient Type: Outpatient Chamber Type: Monoplace Chamber Serial #: [id number] Treatment Protocol: 2.0 ATA with 90 minutes oxygen, and no air breaks Treatment Details Compression Rate Down: 1.0 psi / minute De-Compression Rate Up: 2.0 psi / minute Compress Tx Pressure Air breaks and breathing periods Decompress Decompress Begins Reached (leave unused spaces blank) Begins Ends Chamber Pressure (ATA) 1 2 - - - - - - 2 1 Clock Time (24 hr) 10:33 10:47 - - - - - - 12:17 12:25 Treatment Length: 112 (minutes) Treatment Segments: 4 Vital Signs Capillary Blood Glucose Reference Range: 80 - 120 mg / dl HBO Diabetic Blood Glucose Intervention Range: <131 mg/dl or >249 mg/dl Time Vitals Blood Respiratory Capillary Blood Glucose Pulse Action Type: Pulse: Temperature: Taken: Pressure: Rate: Glucose (mg/dl): Meter #: Oximetry (%) Taken: Pre 10:22 142/60 78 14 98.2 157 1 none per protocol Post 12:30 158/92 60 16 98 134 1 none per protocol Treatment Response Treatment Toleration: Well Treatment Completion Treatment Completed without Adverse Event Status: Treatment Notes Patient stated that his left ear was bothering him. Reported this to [place] Boccio and he  examined tympanic membranes of both ears. Post Treatment Teed Score Post Treatment Teed Score: Left Ear Grade I Post Treatment Teed Score: Right Ear Grade 0 HBO Attestation I certify that I supervised this HBO treatment in accordance with Medicare guidelines. A trained emergency response team is readily Yes available per hospital policies and procedures. Continue HBOT as ordered. Yes Electronic Signature(s) Signed: 09/22/2021 4:21:23 PM By: Worthy Keeler PA-C Previous Signature: 09/22/2021 2:19:45 PM Version By: Donavan Burnet CHT, EMT, BS Previous Signature: 09/22/2021 4:19:54 PM Version By: Carlene Coria, Ulrich S. ([id number]) Previous Signature: 09/22/2021 2:17:15 PM Version By: Donavan Burnet CHT, EMT, BS Previous Signature: 09/22/2021 11:15:01 AM Version By: Donavan Burnet CHT, EMT, BS Previous Signature: 09/22/2021 11:14:32 AM Version By: Donavan Burnet CHT, EMT, BS Entered By: Worthy Keeler on 09/22/2021 16:21:23 ATHEN, RIEL ([id number]) -------------------------------------------------------------------------------- HBO Safety Checklist Details Patient Name: [name] Date of Service: 09/22/2021 10:00 AM Medical Record Number: [id number] Patient Account Number: [id number] Date of Birth/Sex: [birth year] (61 y.o. M) Treating RN: Levora Dredge Primary Care Amparo Donalson: Drema Dallas Other Clinician: Donavan Burnet Referring Guthrie Lemme: Drema Dallas Treating Cora Stetson/Extender: Skipper Cliche in Treatment: 9 HBO Safety Checklist Items Safety Checklist Consent Form Signed Patient voided / foley secured and emptied When did you last eato am Last dose of injectable or oral agent N/A Ostomy pouch emptied and vented if applicable NA All implantable devices assessed, documented and approved NA Intravenous access site secured and place NA Valuables secured Linens and cotton and cotton/polyester blend (less than 51% polyester) Personal  oil-based products / skin lotions / body lotions removed Wigs or hairpieces removed NA Smoking or tobacco materials removed NA Books / newspapers / magazines / loose paper removed Cologne, aftershave, perfume and deodorant removed Jewelry removed (may wrap wedding band) Make-up  removed NA Hair care products removed Battery operated devices (external) removed Heating patches and chemical warmers removed Titanium eyewear removed NA Nail polish cured greater than 10 hours NA Casting material cured greater than 10 hours NA Hearing aids removed NA Loose dentures or partials removed NA Prosthetics have been removed NA Patient demonstrates correct use of air break device (if applicable) Patient concerns have been addressed Patient grounding bracelet on and cord attached to chamber Specifics for Inpatients (complete in addition to above) NA Medication sheet sent with patient Intravenous medications needed or due during therapy sent with patient NA Drainage tubes (e.g. nasogastric tube or chest tube secured and vented) NA Endotracheal or Tracheotomy tube secured NA Cuff deflated of air and inflated with saline NA Airway suctioned NA Notes Paper version used prior to treatment. Electronic Signature(s) Signed: 09/22/2021 11:10:44 AM By: Donavan Burnet CHT, EMT, BS Entered By: Donavan Burnet on 09/22/2021 11:10:43

## 2021-09-23 ENCOUNTER — Encounter: Payer: Managed Care, Other (non HMO) | Admitting: Physician Assistant

## 2021-09-23 DIAGNOSIS — E11621 Type 2 diabetes mellitus with foot ulcer: Secondary | ICD-10-CM | POA: Diagnosis not present

## 2021-09-23 LAB — GLUCOSE, CAPILLARY: Glucose-Capillary: 109 mg/dL — ABNORMAL HIGH (ref 70–99)

## 2021-09-23 NOTE — Progress Notes (Addendum)
Charles [name], Charles [name] ([id number]) Visit Report for 09/23/2021 Arrival Information Details Patient Name: Charles [name], Charles [name] Date of Service: 09/23/2021 10:00 AM Medical Record Number: [id number] Patient Account Number: [id number] Date of Birth/Sex: 1961-01-08 (61 y.o. M) Treating RN: Levora Dredge Primary Care Ailanie Ruttan: Drema Dallas Other Clinician: Donavan Burnet Referring Yeudiel Mateo: Drema Dallas Treating Colter Magowan/Extender: Skipper Cliche in Treatment: 10 Visit Information History Since Last Visit All ordered tests and consults were completed: Yes Patient Arrived: Ambulatory Added or deleted any medications: No Arrival Time: 10:13 Any new allergies or adverse reactions: No Accompanied By: self Had a fall or experienced change in No Transfer Assistance: None activities of daily living that may affect Patient Identification Verified: Yes risk of falls: Secondary Verification Process Completed: Yes Signs or symptoms of abuse/neglect since last visito No Patient Requires Transmission-Based Precautions: No Hospitalized since last visit: No Patient Has Alerts: Yes Implantable device outside of the clinic excluding No Patient Alerts: DIABETIC cellular tissue based products placed in the center since last visit: Pain Present Now: No Electronic Signature(s) Signed: 09/23/2021 10:59:52 AM By: Donavan Burnet CHT, EMT, BS Entered By: Donavan Burnet on 09/23/2021 10:59:51 Charles [name], Charles [name] ([id number]) -------------------------------------------------------------------------------- Encounter Discharge Information Details Patient Name: Charles [name] Date of Service: 09/23/2021 10:00 AM Medical Record Number: [id number] Patient Account Number: [id number] Date of Birth/Sex: 03/03/1961 (61 y.o. M) Treating RN: Levora Dredge Primary Care Glendell Fouse: Drema Dallas Other Clinician: Donavan Burnet Referring Jocilynn Grade: Drema Dallas Treating Mirenda Baltazar/Extender: Skipper Cliche in Treatment: 10 Encounter Discharge Information Items Discharge Condition: Stable Ambulatory Status: Ambulatory Discharge Destination: Home Transportation: Private Auto Accompanied By: self Schedule Follow-up Appointment: No Clinical Summary of Care: Electronic Signature(s) Signed: 09/23/2021 1:25:33 PM By: Donavan Burnet CHT, EMT, BS Entered By: Donavan Burnet on 09/23/2021 13:25:33 Charles [name], Charles [name] ([id number]) -------------------------------------------------------------------------------- Vitals Details Patient Name: Charles [name] Date of Service: 09/23/2021 10:00 AM Medical Record Number: [id number] Patient Account Number: [id number] Date of Birth/Sex: 04-02-1961 (61 y.o. M) Treating RN: Levora Dredge Primary Care Dametria Tuzzolino: Drema Dallas Other Clinician: Donavan Burnet Referring Avenir Lozinski: Drema Dallas Treating Mckenze Slone/Extender: Skipper Cliche in Treatment: 10 Vital Signs Time Taken: 10:37 Temperature (F): 97.8 Weight (lbs): 230 Pulse (bpm): 66 Respiratory Rate (breaths/min): 16 Blood Pressure (mmHg): 136/76 Capillary Blood Glucose (mg/dl): 109 Reference Range: 80 - 120 mg / dl Electronic Signature(s) Signed: 09/23/2021 11:00:46 AM By: Donavan Burnet CHT, EMT, BS Entered By: Donavan Burnet on 09/23/2021 11:00:45

## 2021-09-23 NOTE — Progress Notes (Signed)
MEER, REINDL ([id number]) Visit Report for 09/23/2021 HBO Details Patient Name: Charles [name], Charles [name] Date of Service: 09/23/2021 10:00 AM Medical Record Number: [id number] Patient Account Number: [id number] Date of Birth/Sex: Oct 18, 1960 (61 y.o. 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 Site HBO Treatment Details Treatment Number: 6 Patient Type: Outpatient Chamber Type: Monoplace Chamber Serial #: [id number] Treatment Protocol: 2.0 ATA with 90 minutes oxygen, and no air breaks Treatment Details Compression Rate Down: 1.0 psi / minute De-Compression Rate Up: 1.5 psi / minute Compress Tx Pressure Air breaks and breathing periods Decompress Decompress Begins Reached (leave unused spaces blank) Begins Ends Chamber Pressure (ATA) 1 2 - - - - - - 2 1 Clock Time (24 hr) 10:48 11:04 - - - - - - 12:35 12:46 Treatment Length: 118 (minutes) Treatment Segments: 4 Vital Signs Capillary Blood Glucose Reference Range: 80 - 120 mg / dl HBO Diabetic Blood Glucose Intervention Range: <131 mg/dl or >249 mg/dl Time Capillary Blood Glucose Pulse Blood Respiratory Action Type: Vitals Pulse: Temperature: Glucose Meter Oximetry Pressure: Rate: Taken: Taken: (mg/dl): #: (%) given 8 oz Ensure and begin Tx per Pre 10:37 136/76 66 16 97.8 109 1 Charles [name]. Post 12:48 128/92 60 18 97.5 113 1 none per protocol Pre-Treatment Ear Evaluation Left Right Left Teed Scale: Grade I Right Teed Scale: Grade 0 Treatment Response Treatment Toleration: Well Treatment Completion Treatment Completed without  Adverse Event Status: Treatment Notes Instructed patient about equalizing pressure in ears, explaining that it should be similar to driving in the mountains where one experiences pressure change and swallows, etc to equalize the pressure in the middle ear. I also explained that if he experiences difficulty equalizing to get operator to stop the machine. Patient seemed to do better today. Chamber rate was set at 1 psi/min during pressurization and 1.5 psi/min during decompression of chamber. HBO Attestation I certify that I supervised this HBO treatment in accordance with Medicare guidelines. A trained emergency response team is readily Yes available per hospital policies and procedures. Continue HBOT as ordered. Charles [name] ([id number]) Electronic Signature(s) Signed: 09/24/2021 8:33:25 AM By: Worthy Keeler PA-C Previous Signature: 09/23/2021 1:24:46 PM Version By: Charles [name] CHT, EMT, BS Entered By: Worthy [name] on 09/24/2021 08:33:25 MAZOR, Charles [name] ([id number]) -------------------------------------------------------------------------------- HBO Safety Checklist Details Patient Name: Charles [name] Date of Service: 09/23/2021 10:00 AM Medical Record Number: [id number] Patient Account Number: [id number] Date of Birth/Sex: [birth year] (61 y.o. M) Treating RN: Charles [name] Primary Care Charles [name]: Charles [name] Other Clinician: Donavan [name] Referring Charles [name]: Charles [name] Treating Charles [name]/Extender: Charles [name] in Treatment: 10 HBO Safety Checklist Items Safety Checklist Consent Form Signed Patient voided / foley secured and emptied When did you last eato 0800 Last dose of injectable or oral agent Ostomy pouch emptied and vented if applicable NA All implantable devices assessed, documented and approved spinal stimulator, etc. Intravenous access site secured and place NA Valuables secured Linens and cotton and cotton/polyester blend  (less than 51% polyester) Personal oil-based products / skin lotions / body lotions removed Wigs or hairpieces removed NA Smoking or tobacco materials removed NA Books / newspapers / magazines / loose paper removed Cologne, aftershave, perfume and deodorant removed Jewelry  removed (may wrap wedding band) Make-up removed NA Hair care products removed Battery operated devices (external) removed Heating patches and chemical warmers removed Titanium eyewear removed NA Nail polish cured greater than 10 hours NA Casting material cured greater than 10 hours NA Hearing aids removed NA Loose dentures or partials removed NA Prosthetics have been removed NA Patient demonstrates correct use of air break device (if applicable) Patient concerns have been addressed Patient grounding bracelet on and cord attached to chamber Specifics for Inpatients (complete in addition to above) NA Medication sheet sent with patient Intravenous medications needed or due during therapy sent with patient NA Drainage tubes (e.g. nasogastric tube or chest tube secured and vented) NA Endotracheal or Tracheotomy tube secured NA Cuff deflated of air and inflated with saline NA Airway suctioned NA Notes Paper version used prior to treatment. Electronic Signature(s) Signed: 09/23/2021 11:02:18 AM By: Charles [name] CHT, EMT, BS Entered By: Charles [name] on 09/23/2021 11:02:18

## 2021-09-24 ENCOUNTER — Encounter: Payer: Managed Care, Other (non HMO) | Admitting: Physician Assistant

## 2021-09-24 ENCOUNTER — Telehealth: Payer: Self-pay

## 2021-09-24 ENCOUNTER — Ambulatory Visit: Payer: Managed Care, Other (non HMO) | Admitting: Physician Assistant

## 2021-09-24 ENCOUNTER — Other Ambulatory Visit: Payer: Self-pay | Admitting: Infectious Diseases

## 2021-09-24 LAB — GLUCOSE, CAPILLARY: Glucose-Capillary: 113 mg/dL — ABNORMAL HIGH (ref 70–99)

## 2021-09-24 MED ORDER — AMOXICILLIN 500 MG PO CAPS: 1000.0000 mg | ORAL_CAPSULE | Freq: Three times a day (TID) | ORAL | 0 refills | Status: DC

## 2021-09-24 NOTE — Telephone Encounter (Signed)
Patient advised of lab results and medication refill sent. Patient verbalized understanding. Charles [name]

## 2021-09-24 NOTE — Progress Notes (Signed)
NORMAL, RECINOS ([id number]) Visit Report for 09/23/2021 Problem List Details Patient Name: Charles [name], Charles [name] Date of Service: 09/23/2021 10:00 AM Medical Record Number: [id number] Patient Account Number: [id number] Date of Birth/Sex: 04/11/1961 (61 y.o. M) Treating RN: Levora Dredge Primary Care Provider: Drema Dallas Other Clinician: Donavan Burnet Referring Provider: Drema Dallas Treating Provider/Extender: Skipper Cliche in Treatment: 10 Active Problems ICD-10 Encounter Code Description Active Date MDM Diagnosis E11.621 Type 2 diabetes mellitus with foot ulcer 07/15/2021 No Yes M86.672 Other chronic osteomyelitis, left ankle and foot 09/04/2021 No Yes L97.522 Non-pressure chronic ulcer of other part of left foot with fat layer 07/24/2021 No Yes exposed L97.512 Non-pressure chronic ulcer of other part of right foot with fat layer 07/24/2021 No Yes exposed [place] (primary) hypertension 07/15/2021 No Yes I25.10 Atherosclerotic heart disease of native coronary artery without angina 07/15/2021 No Yes pectoris Inactive Problems Resolved Problems Electronic Signature(s) Signed: 09/24/2021 8:32:57 AM By: Worthy Keeler PA-C Entered By: Worthy Keeler on 09/24/2021 08:32:56 Jakubowicz, Jaxsyn S. ([id number]) -------------------------------------------------------------------------------- SuperBill Details Patient Name: Charles [name] Date of Service: 09/23/2021 Medical Record Number: [id number] Patient Account Number: [id number] Date of Birth/Sex: 04-03-1961 (61 y.o. M) Treating RN: Levora Dredge Primary Care Provider: Drema Dallas Other Clinician: Donavan Burnet Referring Provider: Drema Dallas Treating Provider/Extender: Skipper Cliche in Treatment: 10 Diagnosis Coding ICD-10 Codes Code Description E11.621 Type 2 diabetes mellitus with foot ulcer M86.672 Other chronic osteomyelitis, left ankle and foot L97.522 Non-pressure chronic ulcer of  other part of left foot with fat layer exposed L97.512 Non-pressure chronic ulcer of other part of right foot with fat layer exposed I10 Essential (primary) hypertension I25.10 Atherosclerotic heart disease of native coronary artery without angina pectoris Facility Procedures CPT4 Code: [id number] Description: (Facility Use Only) HBOT, full body chamber, 49mn Modifier: Quantity: 4 CPT4 Code: Description: ICD-10 Diagnosis Description [id number] chronic osteomyelitis, left ankle and foot L97.522 Non-pressure chronic ulcer of other part of left foot with fat layer expos E11.621 Type 2 diabetes mellitus with foot ulcer Modifier: ed Quantity: Physician Procedures CPT4 Code: [id number]: [id number] PHYS HYPERBARIC OXYGEN THERAPY Modifier: Quantity: 1 CPT4 Code: Description: ICD-10 Diagnosis Description M86.672 Other chronic osteomyelitis, left ankle and foot L97.522 Non-pressure chronic ulcer of other part of left foot with fat layer expos E11.621 Type 2 diabetes mellitus with foot ulcer Modifier: ed Quantity: Electronic Signature(s) Signed: 09/24/2021 8:33:30 AM By: SWorthy KeelerPA-C Previous Signature: 09/23/2021 1:25:14 PM Version By: SDonavan BurnetCHT, EMT, BS Entered By: SWorthy Keeleron 09/24/2021 08:33:30

## 2021-09-24 NOTE — Telephone Encounter (Signed)
-----   Message from Tsosie Billing, MD sent at 09/24/2021 12:41 PM EDT ----- Please let the patient know that the labs look good and improving, sent another 30 days of [facility]il[facility]in [facility] . Thx   ----- Message ----- From: Buel Ream, [facility] [place] Sent: 09/18/2021   9:28 AM EDT To: Tsosie Billing, MD

## 2021-09-24 NOTE — Progress Notes (Unsigned)
ESR.CRP improving a lot- sent another 30 days prescription foir amoxicillin- treating for foot osteomyelitis

## 2021-09-25 ENCOUNTER — Encounter: Payer: Managed Care, Other (non HMO) | Admitting: Physician Assistant

## 2021-09-26 ENCOUNTER — Encounter: Payer: Managed Care, Other (non HMO) | Admitting: Physician Assistant

## 2021-09-26 DIAGNOSIS — E11621 Type 2 diabetes mellitus with foot ulcer: Secondary | ICD-10-CM | POA: Diagnosis not present

## 2021-09-26 LAB — GLUCOSE, CAPILLARY
Glucose-Capillary: 151 mg/dL — ABNORMAL HIGH (ref 70–99)
Glucose-Capillary: 105 mg/dL — ABNORMAL HIGH (ref 70–99)

## 2021-09-26 NOTE — Progress Notes (Addendum)
AKSHAT, MINEHART ([id number]) Visit Report for 09/26/2021 Arrival Information Details Patient Name: Charles [name], Charles [name] Date of Service: 09/26/2021 10:00 AM Medical Record Number: [id number] Patient Account Number: [id number] Date of Birth/Sex: [birth year] (61 y.o. M) Treating RN: Levora Dredge Primary Care Dail Lerew: Drema Dallas Other Clinician: Donavan Burnet Referring Jia Mohamed: Drema Dallas Treating Santiago Stenzel/Extender: Skipper Cliche in Treatment: 10 Visit Information History Since Last Visit All ordered tests and consults were completed: Yes Patient Arrived: Ambulatory Added or deleted any medications: No Arrival Time: 10:21 Any new allergies or adverse reactions: No Accompanied By: self Had a fall or experienced change in No Transfer Assistance: None activities of daily living that may affect Patient Identification Verified: Yes risk of falls: Secondary Verification Process Completed: Yes Signs or symptoms of abuse/neglect since last visito No Patient Requires Transmission-Based Precautions: No Hospitalized since last visit: No Patient Has Alerts: Yes Implantable device outside of the clinic excluding No Patient Alerts: DIABETIC cellular tissue based products placed in the center since last visit: Pain Present Now: No Electronic Signature(s) Signed: 09/26/2021 10:39:40 AM By: Donavan Burnet CHT, EMT, BS Entered By: Donavan Burnet on 09/26/2021 10:39:40 Zelenak, Gahel Chauncey Cruel ([id number]) -------------------------------------------------------------------------------- Encounter Discharge Information Details Patient Name: Charles [name] Date of Service: 09/26/2021 10:00 AM Medical Record Number: [id number] Patient Account Number: [id number] Date of Birth/Sex: April 08, 1961 (61 y.o. M) Treating RN: Levora Dredge Primary Care Kenroy Timberman: Drema Dallas Other Clinician: Donavan Burnet Referring Laramie Meissner: Drema Dallas Treating Datha Kissinger/Extender: Skipper Cliche in Treatment: 10 Encounter Discharge Information Items Discharge Condition: Stable Ambulatory Status: Ambulatory Discharge Destination: Home Transportation: Private Auto Accompanied By: self Schedule Follow-up Appointment: No Clinical Summary of Care: Electronic Signature(s) Signed: 09/26/2021 12:58:26 PM By: Donavan Burnet CHT, EMT, BS Entered By: Donavan Burnet on 09/26/2021 12:58:26 Bovey, Chao Chauncey Cruel ([id number]) -------------------------------------------------------------------------------- Vitals Details Patient Name: Charles [name] Date of Service: 09/26/2021 10:00 AM Medical Record Number: [id number] Patient Account Number: [id number] Date of Birth/Sex: March 03, 1961 (61 y.o. M) Treating RN: Levora Dredge Primary Care Bentley Haralson: Drema Dallas Other Clinician: Donavan Burnet Referring Jonathandavid Marlett: Drema Dallas Treating Paeton Studer/Extender: Skipper Cliche in Treatment: 10 Vital Signs Time Taken: 10:28 Temperature (F): 97.8 Weight (lbs): 230 Pulse (bpm): 72 Respiratory Rate (breaths/min): 12 Blood Pressure (mmHg): 132/60 Capillary Blood Glucose (mg/dl): 151 Reference Range: 80 - 120 mg / dl Electronic Signature(s) Signed: 09/26/2021 10:40:35 AM By: Donavan Burnet CHT, EMT, BS Entered By: Donavan Burnet on 09/26/2021 10:40:35

## 2021-09-26 NOTE — Progress Notes (Signed)
LAMOYNE, HESSEL ([id number]) Visit Report for 09/26/2021 HBO Details Patient Name: Charles [name], Charles [name] Date of Service: 09/26/2021 10:00 AM Medical Record Number: [id number] Patient Account Number: [id number] Date of Birth/Sex: 1960-10-04 (61 y.o. 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 HBO Treatment Details Treatment Number: 7 Patient Type: Outpatient Chamber Type: Monoplace Chamber Serial #: [id number] Treatment Protocol: 2.0 ATA with 90 minutes oxygen, and no air breaks Treatment Details Compression Rate Down: 1.0 psi / minute De-Compression Rate Up: 1.5 psi / minute Compress Tx Pressure Air breaks and breathing periods Decompress Decompress Begins Reached (leave unused spaces blank) Begins Ends Chamber Pressure (ATA) 1 2 - - - - - - 2 1 Clock Time (24 hr) 10:34 10:49 - - - - - - 12:19 12:30 Treatment Length: 116 (minutes) Treatment Segments: 4 Vital Signs Capillary Blood Glucose Reference Range: 80 - 120 mg / dl HBO Diabetic Blood Glucose Intervention Range: <131 mg/dl or >249 mg/dl Time Vitals Blood Respiratory Capillary Blood Glucose Pulse Action Type: Pulse: Temperature: Taken: Pressure: Rate: Glucose (mg/dl): Meter #: Oximetry (%) Taken: Pre 10:28 132/60 72 12 97.8 151 1 none per protocol Post 12:33 134/88 72 14 97.7 105 1 none per protocol Pre-Treatment Ear Evaluation Left Right Left Teed Scale: Grade 0 Right Teed Scale: Grade 0 Treatment Response Treatment Toleration: Well Treatment Completion Treatment Completed without Adverse Event Status: HBO  Attestation I certify that I supervised this HBO treatment in accordance with Medicare guidelines. A trained emergency response team is readily Yes available per hospital policies and procedures. Continue HBOT as ordered. Yes Electronic Signature(s) Signed: 09/26/2021 5:37:49 PM By: Worthy Keeler PA-C Previous Signature: 09/26/2021 12:57:18 PM Version By: Donavan Burnet CHT, EMT, BS Previous Signature: 09/26/2021 12:55:50 PM Version By: Donavan Burnet CHT, EMT, BS Entered By: Worthy Keeler on 09/26/2021 17:37:49 Gazzola, Charles [name] Kitchen ([id number] Modena, Jamaree Chauncey Cruel ([id number]) -------------------------------------------------------------------------------- HBO Safety Checklist Details Patient Name: Charles [name] Date of Service: 09/26/2021 10:00 AM Medical Record Number: [id number] Patient Account Number: [id number] Date of Birth/Sex: Jul 17, 1960 (61 y.o. M) Treating RN: Levora Dredge Primary Care Shawnte Demarest: Drema Dallas Other Clinician: Donavan Burnet Referring Shayli Altemose: Drema Dallas Treating Rainbow Salman/Extender: Skipper Cliche in Treatment: 10 HBO Safety Checklist Items Safety Checklist Consent Form Signed Patient voided / foley secured and emptied When did you last eato 0900 Last dose of injectable or oral agent 0900 Ostomy pouch emptied and vented if applicable NA All implantable devices assessed, documented and approved spinal stimulator, etc. Intravenous access site secured and place NA Valuables secured Linens and cotton and cotton/polyester blend (less than 51% polyester) Personal oil-based products / skin lotions / body lotions removed Wigs or hairpieces removed NA Smoking or tobacco materials removed NA Books / newspapers / magazines / loose paper removed Cologne, aftershave, perfume and deodorant removed Jewelry removed (may wrap wedding band) Make-up removed NA Hair care products removed Battery operated devices (external) removed Heating  patches and chemical warmers removed Titanium eyewear removed NA Nail polish cured greater than 10 hours NA Casting material cured greater than 10 hours NA Hearing aids removed NA Loose dentures or partials removed NA Prosthetics have been removed NA Patient demonstrates correct use of air break device (if  applicable) Patient concerns have been addressed Patient grounding bracelet on and cord attached to chamber Specifics for Inpatients (complete in addition to above) NA Medication sheet sent with patient Intravenous medications needed or due during therapy sent with patient NA Drainage tubes (e.g. nasogastric tube or chest tube secured and vented) NA Endotracheal or Tracheotomy tube secured NA Cuff deflated of air and inflated with saline NA Airway suctioned NA Notes Paper version used prior to treatment. Electronic Signature(s) Signed: 09/26/2021 10:44:48 AM By: Donavan Burnet CHT, EMT, BS Entered By: Donavan Burnet on 09/26/2021 10:44:48

## 2021-09-27 NOTE — Progress Notes (Signed)
LORRIS, CARDUCCI ([id number]) Visit Report for 09/26/2021 Problem List Details Patient Name: Charles [name], Charles [name] Date of Service: 09/26/2021 10:00 AM Medical Record Number: [id number] Patient Account Number: [id number] Date of Birth/Sex: 06/19/1960 (61 y.o. 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 ([id number]) -------------------------------------------------------------------------------- SuperBill Details Patient Name: [name] Date of Service: 09/26/2021 Medical Record Number: [id number] Patient Account Number: [id number] Date of Birth/Sex: 07/22/1960 (61 y.o. M) Treating RN: Levora Dredge Primary Care Provider: Drema Dallas Other Clinician: Donavan Burnet Referring Provider: Drema Dallas Treating Provider/Extender: Skipper Cliche in Treatment: 10 Diagnosis Coding ICD-10 Codes Code Description E11.621 Type 2 diabetes mellitus with foot ulcer M86.672 Other chronic osteomyelitis, left ankle and foot L97.522 Non-pressure chronic ulcer of  other part of left foot with fat layer exposed L97.512 Non-pressure chronic ulcer of other part of right foot with fat layer exposed I10 Essential (primary) hypertension I25.10 Atherosclerotic heart disease of native coronary artery without angina pectoris Facility Procedures CPT4 Code: [id number] Description: (Facility Use Only) HBOT, full body chamber, 60mn Modifier: Quantity: 4 CPT4 Code: Description: ICD-10 Diagnosis Description [id number] chronic osteomyelitis, left ankle and foot L97.522 Non-pressure chronic ulcer of other part of left foot with fat layer expos E11.621 Type 2 diabetes mellitus with foot ulcer Modifier: ed Quantity: Physician Procedures CPT4 Code: [id number]: [id number] PHYS HYPERBARIC OXYGEN THERAPY Modifier: Quantity: 1 CPT4 Code: Description: ICD-10 Diagnosis Description M86.672 Other chronic osteomyelitis, left ankle and foot L97.522 Non-pressure chronic ulcer of other 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

## 2021-09-29 ENCOUNTER — Encounter: Payer: Managed Care, Other (non HMO) | Admitting: Physician Assistant

## 2021-09-29 DIAGNOSIS — E11621 Type 2 diabetes mellitus with foot ulcer: Secondary | ICD-10-CM | POA: Diagnosis not present

## 2021-09-29 LAB — GLUCOSE, CAPILLARY
Glucose-Capillary: 198 mg/dL — ABNORMAL HIGH (ref 70–99)
Glucose-Capillary: 96 mg/dL (ref 70–99)

## 2021-09-30 ENCOUNTER — Encounter: Payer: Managed Care, Other (non HMO) | Admitting: Internal Medicine

## 2021-09-30 DIAGNOSIS — E11621 Type 2 diabetes mellitus with foot ulcer: Secondary | ICD-10-CM | POA: Diagnosis not present

## 2021-09-30 LAB — GLUCOSE, CAPILLARY
Glucose-Capillary: 155 mg/dL — ABNORMAL HIGH (ref 70–99)
Glucose-Capillary: 96 mg/dL (ref 70–99)

## 2021-09-30 NOTE — Progress Notes (Signed)
Charles [name], Charles [name] ([id number]) Visit Report for 09/29/2021 Arrival Information Details Patient Name: Charles [name], Charles [name] Date of Service: 09/29/2021 10:00 AM Medical Record Number: [id number] Patient Account Number: [id number] Date of Birth/Sex: 01-24-1961 (61 y.o. M) Treating RN: Primary Care Charles [name]: Charles [name] Other Clinician: Jacqulyn [name] Referring Charles [name]: Charles [name] Treating Charles [name]/Extender: Charles [name] in Treatment: 10 Visit Information History Since Last Visit Added or deleted any medications: No Patient Arrived: Ambulatory Any new allergies or adverse reactions: No Arrival Time: 10:06 Had a fall or experienced change in No Accompanied By: self activities of daily living that may affect Transfer Assistance: None risk of falls: Patient Identification Verified: Yes Signs or symptoms of abuse/neglect since last visito No Secondary Verification Process Completed: Yes Hospitalized since last visit: No Patient Requires Transmission-Based Precautions: No Implantable device outside of the clinic excluding No Patient Has Alerts: Yes cellular tissue based products placed in the center Patient Alerts: DIABETIC since last visit: Pain Present Now: No Electronic Signature(s) Signed: 09/30/2021 11:47:49 AM By: Charles [name] RCP, RRT, CHT Entered By: Charles [name] on 09/29/2021 10:28:30 Charles [name] ([id number]) -------------------------------------------------------------------------------- Encounter Discharge Information Details Patient Name: Charles [name] Date of Service: 09/29/2021 10:00 AM Medical Record Number: [id number] Patient Account Number: [id number] Date of Birth/Sex: 17-Nov-1960 (61 y.o. M) Treating RN: Primary Care Charles [name]: Charles [name] Other Clinician: Jacqulyn [name] Referring Charles [name]: Charles [name] Treating Charles [name]/Extender: Charles [name] in Treatment: 10 Encounter Discharge Information  Items Discharge Condition: Stable Ambulatory Status: Ambulatory Discharge Destination: Home Transportation: Private Auto Accompanied By: self Schedule Follow-up Appointment: Yes Clinical Summary of Care: Notes Patient has a HBO treatment at 09:00 am on 09/30/21 Electronic Signature(s) Signed: 09/30/2021 11:47:49 AM By: Charles [name] RCP, RRT, CHT Entered By: Charles [name] on 09/29/2021 14:48:57 Charles [name], Charles [name] ([id number]) -------------------------------------------------------------------------------- Vitals Details Patient Name: Charles [name] Date of Service: 09/29/2021 10:00 AM Medical Record Number: [id number] Patient Account Number: [id number] Date of Birth/Sex: [birth year] (61 y.o. M) Treating RN: Primary Care Charles [name]: Charles [name] Other Clinician: Jacqulyn [name] Referring Charles [name]: Charles [name] Treating Charles [name]/Extender: Charles [name] in Treatment: 10 Vital Signs Time Taken: 10:06 Temperature (F): 98.4 Weight (lbs): 230 Pulse (bpm): 72 Respiratory Rate (breaths/min): 16 Blood Pressure (mmHg): 142/76 Capillary Blood Glucose (mg/dl): 198 Reference Range: 80 - 120 mg / dl Electronic Signature(s) Signed: 09/30/2021 11:47:49 AM By: Charles [name] RCP, RRT, CHT Entered By: Charles [name] on 09/29/2021 10:30:03

## 2021-09-30 NOTE — Progress Notes (Signed)
Charles [name] ([id number]) Visit Report for 09/30/2021 Arrival Information Details Patient Name: Charles [name], Charles [name] Date of Service: 09/30/2021 10:00 AM Medical Record Number: [id number] Patient Account Number: [id number] Date of Birth/Sex: 01-16-1961 (61 y.o. M) Treating RN: Primary Care Emari Demmer: Drema Dallas Other Clinician: Jacqulyn Bath Referring Imojean Yoshino: Drema Dallas Treating Cecia Egge/Extender: Tito Dine in Treatment: 11 Visit Information History Since Last Visit Added or deleted any medications: No Patient Arrived: Ambulatory Any new allergies or adverse reactions: No Arrival Time: 08:48 Had a fall or experienced change in No Accompanied By: self activities of daily living that may affect Transfer Assistance: None risk of falls: Patient Identification Verified: Yes Signs or symptoms of abuse/neglect since last visito No Secondary Verification Process Completed: Yes Hospitalized since last visit: No Patient Requires Transmission-Based Precautions: No Implantable device outside of the clinic excluding No Patient Has Alerts: Yes cellular tissue based products placed in the center Patient Alerts: DIABETIC since last visit: Pain Present Now: No Electronic Signature(s) Signed: 09/30/2021 11:47:49 AM By: Enedina Finner RCP, RRT, CHT Entered By: Enedina Finner on 09/30/2021 09:17:12 [name], [name] ([id number]) -------------------------------------------------------------------------------- Encounter Discharge Information Details Patient Name: Charles [name] Date of Service: 09/30/2021 10:00 AM Medical Record Number: [id number] Patient Account Number: [id number] Date of Birth/Sex: 02/16/1961 (61 y.o. M) Treating RN: Primary Care Jamilett Ferrante: Drema Dallas Other Clinician: Jacqulyn Bath Referring Cristie Mckinney: Drema Dallas Treating Lakisa Lotz/Extender: Tito Dine in Treatment: 11 Encounter Discharge Information  Items Discharge Condition: Stable Ambulatory Status: Ambulatory Discharge Destination: Home Transportation: Private Auto Accompanied By: self Schedule Follow-up Appointment: Yes Clinical Summary of Care: Notes Patient has an HBO treatment scheduled on 10/01/21 at 08:00 am. Electronic Signature(s) Signed: 09/30/2021 11:47:49 AM By: Enedina Finner RCP, RRT, CHT Entered By: Enedina Finner on 09/30/2021 11:47:23 [name], [name] ([id number]) -------------------------------------------------------------------------------- Vitals Details Patient Name: Charles [name] Date of Service: 09/30/2021 10:00 AM Medical Record Number: [id number] Patient Account Number: [id number] Date of Birth/Sex: [birth year] (61 y.o. M) Treating RN: Primary Care Syed Zukas: Drema Dallas Other Clinician: Jacqulyn Bath Referring Diem Pagnotta: Drema Dallas Treating Brenson Hartman/Extender: Tito Dine in Treatment: 11 Vital Signs Time Taken: 09:04 Temperature (F): 97.9 Weight (lbs): 230 Pulse (bpm): 72 Respiratory Rate (breaths/min): 16 Blood Pressure (mmHg): 138/84 Capillary Blood Glucose (mg/dl): 155 Reference Range: 80 - 120 mg / dl Electronic Signature(s) Signed: 09/30/2021 11:47:49 AM By: Enedina Finner RCP, RRT, CHT Entered By: Enedina Finner on 09/30/2021 09:20:21

## 2021-09-30 NOTE — Progress Notes (Signed)
Charles [name], Charles [name] ([id number]) Visit Report for 09/29/2021 HBO Details Patient Name: Charles [name], Charles [name] Date of Service: 09/29/2021 10:00 AM Medical Record Number: [id number] Patient Account Number: [id number] Date of Birth/Sex: 03-21-1961 (61 y.o. 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 HBO Treatment Details Treatment Number: 8 Patient Type: Outpatient Chamber Type: Monoplace Chamber Serial #: [id number] Treatment Protocol: 2.0 ATA with 90 minutes oxygen, and no air breaks Treatment Details Compression Rate Down: 1.5 psi / minute De-Compression Rate Up: 1.5 psi / minute Compress Tx Pressure Air breaks and breathing periods Decompress Decompress Begins Reached (leave unused spaces blank) Begins Ends Chamber Pressure (ATA) 1 2 - - - - - - 2 1 Clock Time (24 hr) 10:15 10:28 - - - - - - 11:59 12:08 Treatment Length: 113 (minutes) Treatment Segments: 4 Vital Signs Capillary Blood Glucose Reference Range: 80 - 120 mg / dl HBO Diabetic Blood Glucose Intervention Range: <131 mg/dl or >249 mg/dl Time Vitals Blood Respiratory Capillary Blood Glucose Pulse Action Type: Pulse: Temperature: Taken: Pressure: Rate: Glucose (mg/dl): Meter #: Oximetry (%) Taken: Pre 10:06 142/76 72 16 98.4 198 1 none per protocol Post 12:13 144/88 72 16 98.2 96 1 none per protocol Treatment Response Treatment Toleration: Well Treatment Completion Treatment Completed without Adverse Event Status: Electronic Signature(s) Signed: 09/29/2021 3:46:28 PM By: Worthy Keeler PA-C Signed: 09/30/2021 11:47:49 AM By: Enedina Finner RCP, RRT, CHT Entered By: Enedina Finner on 09/29/2021 14:48:03 Charles [name], Charles [name] ([id number]) -------------------------------------------------------------------------------- HBO Safety Checklist Details Patient Name: Charles [name] Date of Service: 09/29/2021 10:00 AM Medical Record Number: [id number] Patient Account Number: [id number] Date of Birth/Sex: April 29, 1960 (61 y.o. M) Treating RN: Primary Care Venissa Nappi: Drema Dallas Other Clinician: Jacqulyn Bath Referring Zacharias Ridling: Drema Dallas Treating Tilley Faeth/Extender: Skipper Cliche in Treatment: 10 HBO Safety Checklist Items Safety Checklist Consent Form Signed Patient voided / foley secured and emptied When did you last eato 09:30 am Last dose of injectable or oral agent n/a Ostomy pouch emptied and vented if applicable NA All implantable devices assessed, documented and approved NA Intravenous access site secured and place NA Valuables secured Linens and cotton and cotton/polyester blend (less than 51% polyester) Personal oil-based products / skin lotions / body lotions removed Wigs or hairpieces removed NA Smoking or tobacco materials removed NA Books / newspapers / magazines / loose paper removed NA Cologne, aftershave, perfume and deodorant removed Jewelry removed (may wrap wedding band) Make-up removed NA Hair care products removed Battery operated devices (external) removed NA Heating patches and chemical warmers removed NA Titanium eyewear removed NA Nail polish cured greater than 10 hours NA Casting material cured greater than 10 hours NA Hearing aids removed NA Loose dentures or partials removed NA Prosthetics have been removed NA Patient demonstrates correct use of air break device (if applicable) Patient concerns have been addressed Patient grounding bracelet on and cord attached to chamber Specifics for Inpatients (complete in addition  to above) Medication sheet sent with patient Intravenous medications needed or due during therapy sent with patient Drainage tubes (e.g. nasogastric tube or chest tube secured and vented) Endotracheal or Tracheotomy tube secured Cuff deflated of air and inflated with saline Airway suctioned Electronic Signature(s) Signed: 09/30/2021  11:47:49 AM By: Enedina Finner RCP, RRT, CHT Entered By: Enedina Finner on 09/29/2021 10:31:25

## 2021-10-01 ENCOUNTER — Encounter (HOSPITAL_BASED_OUTPATIENT_CLINIC_OR_DEPARTMENT_OTHER): Payer: Managed Care, Other (non HMO) | Admitting: Internal Medicine

## 2021-10-01 DIAGNOSIS — E11621 Type 2 diabetes mellitus with foot ulcer: Secondary | ICD-10-CM

## 2021-10-01 DIAGNOSIS — M86672 Other chronic osteomyelitis, left ankle and foot: Secondary | ICD-10-CM

## 2021-10-01 DIAGNOSIS — L97522 Non-pressure chronic ulcer of other part of left foot with fat layer exposed: Secondary | ICD-10-CM | POA: Diagnosis not present

## 2021-10-01 LAB — GLUCOSE, CAPILLARY
Glucose-Capillary: 178 mg/dL — ABNORMAL HIGH (ref 70–99)
Glucose-Capillary: 97 mg/dL (ref 70–99)
Glucose-Capillary: 95 mg/dL (ref 70–99)

## 2021-10-01 NOTE — Progress Notes (Signed)
Charles [name], Charles [name] ([id number]) Visit Report for 10/01/2021 Arrival Information Details Patient Name: Charles [name] Date of Service: 10/01/2021 8:00 AM Medical Record Number: [id number] Patient Account Number: [id number] Date of Birth/Sex: 1960/12/05 (61 y.o. M) Treating RN: Primary Care Loel Betancur: Drema Dallas Other Clinician: Jacqulyn Bath Referring Jayten Gabbard: Drema Dallas Treating Lidiya Reise/Extender: Yaakov Guthrie in Treatment: 11 Visit Information History Since Last Visit Added or deleted any medications: No Patient Arrived: Ambulatory Any new allergies or adverse reactions: No Arrival Time: 07:55 Had a fall or experienced change in No Accompanied By: self activities of daily living that may affect Transfer Assistance: None risk of falls: Patient Identification Verified: Yes Signs or symptoms of abuse/neglect since last visito No Secondary Verification Process Completed: Yes Hospitalized since last visit: No Patient Requires Transmission-Based Precautions: No Implantable device outside of the clinic excluding No Patient Has Alerts: Yes cellular tissue based products placed in the center Patient Alerts: DIABETIC since last visit: Pain Present Now: No Electronic Signature(s) Signed: 10/01/2021 11:07:29 AM By: Enedina Finner RCP, RRT, CHT Entered By: Stark Jock, Amado Nash on 10/01/2021 08:38:41 Sudbeck, Quade Chauncey Cruel ([id number]) -------------------------------------------------------------------------------- Encounter Discharge Information Details Patient Name: Charles [name] Date of Service: 10/01/2021 8:00 AM Medical Record Number: [id number] Patient Account Number: [id number] Date of Birth/Sex: 02-21-1961 (61 y.o. M) Treating RN: Primary Care Amanda Steuart: Drema Dallas Other Clinician: Jacqulyn Bath Referring Meleane Selinger: Drema Dallas Treating Akyah Lagrange/Extender: Yaakov Guthrie in Treatment: 11 Encounter Discharge Information  Items Discharge Condition: Stable Ambulatory Status: Ambulatory Discharge Destination: Home Transportation: Private Auto Accompanied By: self Schedule Follow-up Appointment: Yes Clinical Summary of Care: Notes Patient has an HBO treatment scheduled on 10/01/21 at 08:00 am. Electronic Signature(s) Signed: 10/01/2021 11:07:29 AM By: Enedina Finner RCP, RRT, CHT Entered By: Enedina Finner on 10/01/2021 11:07:04 [place], Logyn Chauncey Cruel ([id number]) -------------------------------------------------------------------------------- Vitals Details Patient Name: Charles [name] Date of Service: 10/01/2021 8:00 AM Medical Record Number: [id number] Patient Account Number: [id number] Date of Birth/Sex: Sep 16, 1960 (61 y.o. M) Treating RN: Primary Care Susy Placzek: Drema Dallas Other Clinician: Jacqulyn Bath Referring Deetra Booton: Drema Dallas Treating Aron Needles/Extender: Yaakov Guthrie in Treatment: 11 Vital Signs Time Taken: 08:04 Temperature (F): 98.1 Weight (lbs): 230 Pulse (bpm): 66 Respiratory Rate (breaths/min): 16 Blood Pressure (mmHg): 142/84 Capillary Blood Glucose (mg/dl): 97 Reference Range: 80 - 120 mg / dl Electronic Signature(s) Signed: 10/01/2021 11:07:29 AM By: Enedina Finner RCP, RRT, CHT Entered By: Enedina Finner on 10/01/2021 08:47:10

## 2021-10-01 NOTE — Progress Notes (Addendum)
ZAEVION, PARKE ([id number]) Visit Report for 09/30/2021 Arrival Information Details Patient Name: Charles [name], Charles [name] Date of Service: 09/30/2021 8:15 AM Medical Record Number: [id number] Patient Account Number: [id number] Date of Birth/Sex: 1961-03-10 (61 y.o. M) Treating RN: Cornell Barman Primary Care Doak Mah: Drema Dallas Other Clinician: Massie Kluver Referring Taimi Towe: Drema Dallas Treating Jazell Rosenau/Extender: Tito Dine in Treatment: 11 Visit Information History Since Last Visit Added or deleted any medications: No Patient Arrived: Ambulatory Any new allergies or adverse reactions: No Arrival Time: 08:05 Had a fall or experienced change in No Transfer Assistance: None activities of daily living that may affect Patient Requires Transmission-Based Precautions: No risk of falls: Patient Has Alerts: Yes Hospitalized since last visit: No Patient Alerts: DIABETIC Pain Present Now: Yes Electronic Signature(s) Signed: 10/01/2021 1:49:35 PM By: Massie Kluver Entered By: Massie Kluver on 09/30/2021 08:07:01 Zuno, Gaines Chauncey Cruel ([id number]) -------------------------------------------------------------------------------- Clinic Level of Care Assessment Details Patient Name: Charles [name] Date of Service: 09/30/2021 8:15 AM Medical Record Number: [id number] Patient Account Number: [id number] Date of Birth/Sex: Aug 01, 1960 (61 y.o. M) Treating RN: Cornell Barman Primary Care Sharia Averitt: Drema Dallas Other Clinician: Massie Kluver Referring December Hedtke: Drema Dallas Treating Wilkie Zenon/Extender: Tito Dine in Treatment: 11 Clinic Level of Care Assessment Items TOOL 1 Quantity Score '[]'$  - Use when EandM and Procedure is performed on INITIAL visit 0 ASSESSMENTS - Nursing Assessment / Reassessment '[]'$  - General Physical Exam (combine w/ comprehensive assessment (listed just below) when performed on new 0 pt. evals) '[]'$  - 0 Comprehensive Assessment  (HX, ROS, Risk Assessments, Wounds Hx, etc.) ASSESSMENTS - Wound and Skin Assessment / Reassessment '[]'$  - Dermatologic / Skin Assessment (not related to wound area) 0 ASSESSMENTS - Ostomy and/or Continence Assessment and Care '[]'$  - Incontinence Assessment and Management 0 '[]'$  - 0 Ostomy Care Assessment and Management (repouching, etc.) PROCESS - Coordination of Care '[]'$  - Simple Patient / Family Education for ongoing care 0 '[]'$  - 0 Complex (extensive) Patient / Family Education for ongoing care '[]'$  - 0 Staff obtains Programmer, systems, Records, Test Results / Process Orders '[]'$  - 0 Staff telephones HHA, Nursing Homes / Clarify orders / etc '[]'$  - 0 Routine Transfer to another Facility (non-emergent condition) '[]'$  - 0 Routine Hospital Admission (non-emergent condition) '[]'$  - 0 New Admissions / Biomedical engineer / Ordering NPWT, Apligraf, etc. '[]'$  - 0 Emergency Hospital Admission (emergent condition) PROCESS - Special Needs '[]'$  - Pediatric / Minor Patient Management 0 '[]'$  - 0 Isolation Patient Management '[]'$  - 0 Hearing / Language / Visual special needs '[]'$  - 0 Assessment of Community assistance (transportation, D/C planning, etc.) '[]'$  - 0 Additional assistance / Altered mentation '[]'$  - 0 Support Surface(s) Assessment (bed, cushion, seat, etc.) INTERVENTIONS - Miscellaneous '[]'$  - External ear exam 0 '[]'$  - 0 Patient Transfer (multiple staff / Civil Service fast streamer / Similar devices) '[]'$  - 0 Simple Staple / Suture removal (25 or less) '[]'$  - 0 Complex Staple / Suture removal (26 or more) '[]'$  - 0 Hypo/Hyperglycemic Management (do not check if billed separately) '[]'$  - 0 Ankle / Brachial Index (ABI) - do not check if billed separately Has the patient been seen at the hospital within the last three years: Yes Total Score: 0 Level Of Care: ____ Charles [name] ([id number]) Electronic Signature(s) Signed: 10/01/2021 1:49:35 PM By: Massie Kluver Entered By: Massie Kluver on 09/30/2021 08:49:27 Kotlyar, Jashad  Chauncey Cruel ([id number]) -------------------------------------------------------------------------------- Encounter Discharge Information Details Patient Name: Charles [name] Date of Service: 09/30/2021 8:15 AM Medical Record Number: [id number]  Patient Account Number: [id number] Date of Birth/Sex: 05/26/1960 (61 y.o. M) Treating RN: Cornell Barman Primary Care Amerigo Mcglory: Drema Dallas Other Clinician: Massie Kluver Referring Juwann Sherk: Drema Dallas Treating Elba Dendinger/Extender: Tito Dine in Treatment: 11 Encounter Discharge Information Items Post Procedure Vitals Discharge Condition: Stable Temperature (F): 97.9 Ambulatory Status: Ambulatory Pulse (bpm): 65 Discharge Destination: Home Respiratory Rate (breaths/min): 16 Transportation: Private Auto Blood Pressure (mmHg): 154/84 Accompanied By: Carlynn Purl Schedule Follow-up Appointment: Yes Clinical Summary of Care: Notes HBO chamber Electronic Signature(s) Signed: 10/01/2021 1:49:35 PM By: Massie Kluver Entered By: Massie Kluver on 09/30/2021 08:54:18 Hohmann, Renn Chauncey Cruel ([id number]) -------------------------------------------------------------------------------- Lower Extremity Assessment Details Patient Name: Charles [name] Date of Service: 09/30/2021 8:15 AM Medical Record Number: [id number] Patient Account Number: [id number] Date of Birth/Sex: 01/04/1961 (61 y.o. M) Treating RN: Cornell Barman Primary Care Dearies Meikle: Drema Dallas Other Clinician: Massie Kluver Referring Manuela Halbur: Drema Dallas Treating Sadie Pickar/Extender: Tito Dine in Treatment: 11 Edema Assessment Assessed: Shirlyn Goltz: Yes] Patrice Paradise: Yes] Edema: [Left: Yes] [Right: Yes] Calf Left: Right: Point of Measurement: 32 cm From Medial Instep 42.6 cm 42 cm Ankle Left: Right: Point of Measurement: 11 cm From Medial Instep 30.2 cm 28 cm Vascular Assessment Pulses: Dorsalis Pedis Palpable: [Left:Yes] [Right:Yes] Electronic  Signature(s) Signed: 09/30/2021 12:11:05 PM By: Gretta Cool, BSN, RN, CWS, Kim RN, BSN Signed: 10/01/2021 1:49:35 PM By: Massie Kluver Entered By: Massie Kluver on 09/30/2021 08:19:03 Steinberg, Vic Chauncey Cruel ([id number]) -------------------------------------------------------------------------------- Multi Wound Chart Details Patient Name: Charles [name] Date of Service: 09/30/2021 8:15 AM Medical Record Number: [id number] Patient Account Number: [id number] Date of Birth/Sex: Sep 28, 1960 (61 y.o. M) Treating RN: Cornell Barman Primary Care Sheena Simonis: Drema Dallas Other Clinician: Massie Kluver Referring Offie Pickron: Drema Dallas Treating Boluwatife Flight/Extender: Tito Dine in Treatment: 11 Vital Signs Height(in): Pulse(bpm): 65 Weight(lbs): 230 Blood Pressure(mmHg): 154/84 Body Mass Index(BMI): Temperature(F): 97.9 Respiratory Rate(breaths/min): 16 Photos: [N/A:N/A] Wound Location: Right, Plantar Foot N/A N/A Wounding Event: Gradually Appeared N/A N/A Primary Etiology: Diabetic Wound/Ulcer of the Lower N/A N/A Extremity Comorbid History: Coronary Artery Disease, N/A N/A Hypertension, Type II Diabetes Date Acquired: 04/20/2020 N/A N/A Weeks of Treatment: 11 N/A N/A Wound Status: Open N/A N/A Wound Recurrence: No N/A N/A Measurements L x W x D (cm) 0.1x0.2x0.2 N/A N/A Area (cm) : 0.016 N/A N/A Volume (cm) : 0.003 N/A N/A % Reduction in Area: 91.50% N/A N/A % Reduction in Volume: 96.00% N/A N/A Classification: Grade 2 N/A N/A Exudate Amount: Medium N/A N/A Exudate Type: Serosanguineous N/A N/A Exudate Color: red, brown N/A N/A Granulation Amount: Large (67-100%) N/A N/A Granulation Quality: Red, Pink N/A N/A Necrotic Amount: Small (1-33%) N/A N/A Exposed Structures: Fat Layer (Subcutaneous Tissue): N/A N/A Yes Fascia: No Tendon: No Muscle: No Joint: No Bone: No Epithelialization: None N/A N/A Treatment Notes Electronic Signature(s) Signed: 10/01/2021 1:49:35 PM  By: Massie Kluver Entered By: Massie Kluver on 09/30/2021 08:25:40 Birch, Corran Chauncey Cruel ([id number]) -------------------------------------------------------------------------------- Multi-Disciplinary Care Plan Details Patient Name: Charles [name] Date of Service: 09/30/2021 8:15 AM Medical Record Number: [id number] Patient Account Number: [id number] Date of Birth/Sex: 1960-06-24 (61 y.o. M) Treating RN: Cornell Barman Primary Care Jazsmin Couse: Drema Dallas Other Clinician: Massie Kluver Referring Harvir Patry: Drema Dallas Treating Joncarlo Friberg/Extender: Tito Dine in Treatment: 11 Active Inactive Electronic Signature(s) Signed: 10/31/2021 8:29:46 AM By: Gretta Cool, BSN, RN, CWS, Kim RN, BSN Previous Signature: 09/30/2021 12:11:05 PM Version By: Gretta Cool BSN, RN, CWS, Kim RN, BSN Previous Signature: 10/01/2021 1:49:35 PM Version By: Massie Kluver Entered By: Gretta Cool BSN, RN, CWS, Kim  on 10/31/2021 08:29:46 DMARIO, RUSSOM ([id number]) -------------------------------------------------------------------------------- Pain Assessment Details Patient Name: AAKASH, HOLLOMON Date of Service: 09/30/2021 8:15 AM Medical Record Number: [id number] Patient Account Number: [id number] Date of Birth/Sex: 1961-01-25 (61 y.o. M) Treating RN: Cornell Barman Primary Care Braylon Grenda: Drema Dallas Other Clinician: Massie Kluver Referring Anise Harbin: Drema Dallas Treating Aixa Corsello/Extender: Tito Dine in Treatment: 11 Active Problems Location of Pain Severity and Description of Pain Patient Has Paino Yes Site Locations Pain Location: Pain in Ulcers Duration of the Pain. Constant / Intermittento Intermittent Rate the pain. Current Pain Level: 9 Character of Pain Describe the Pain: Stabbing Pain Management and Medication Current Pain Management: Medication: Yes Rest: Yes How does your wound impact your activities of daily livingo Sleep: Yes Electronic Signature(s) Signed:  09/30/2021 12:11:05 PM By: Gretta Cool, BSN, RN, CWS, Kim RN, BSN Signed: 10/01/2021 1:49:35 PM By: Massie Kluver Entered By: Massie Kluver on 09/30/2021 08:09:45 Charles [name] ([id number]) -------------------------------------------------------------------------------- Patient/Caregiver Education Details Patient Name: Charles [name] Date of Service: 09/30/2021 8:15 AM Medical Record Number: [id number] Patient Account Number: [id number] Date of Birth/Gender: 1961-02-01 (61 y.o. M) Treating RN: Cornell Barman Primary Care Physician: Drema Dallas Other Clinician: Massie Kluver Referring Physician: Drema Dallas Treating Physician/Extender: Tito Dine in Treatment: 11 Education Assessment Education Provided To: Patient Education Topics Provided Wound/Skin Impairment: Handouts: Other: continue wound care as directed Electronic Signature(s) Signed: 10/01/2021 1:49:35 PM By: Massie Kluver Entered By: Massie Kluver on 09/30/2021 08:49:53 Sigg, Tarvis Chauncey Cruel ([id number]) -------------------------------------------------------------------------------- Wound Assessment Details Patient Name: Charles [name] Date of Service: 09/30/2021 8:15 AM Medical Record Number: [id number] Patient Account Number: [id number] Date of Birth/Sex: 26-Jan-1961 (61 y.o. M) Treating RN: Cornell Barman Primary Care Ryosuke Ericksen: Drema Dallas Other Clinician: Massie Kluver Referring Carlester Kasparek: Drema Dallas Treating Juri Dinning/Extender: Tito Dine in Treatment: 11 Wound Status Wound Number: 2 Primary Etiology: Diabetic Wound/Ulcer of the Lower Extremity Wound Location: Right, Plantar Foot Wound Status: Healed - Epithelialized Wounding Event: Gradually Appeared Comorbid Coronary Artery Disease, Hypertension, Type II History: Diabetes Date Acquired: 04/20/2020 Weeks Of Treatment: 11 Clustered Wound: No Photos Wound Measurements Length: (cm) 0 % R Width: (cm) 0 % R Depth: (cm)  0 Epi Area: (cm) 0 Volume: (cm) 0 eduction in Area: 91.5% eduction in Volume: 96% thelialization: None Wound Description Classification: Grade 2 Fo Exudate Amount: Medium Sl Exudate Type: Serosanguineous Exudate Color: red, brown ul Odor After Cleansing: No ough/Fibrino Yes Wound Bed Granulation Amount: Large (67-100%) Exposed Structure Granulation Quality: Red, Pink Fascia Exposed: No Necrotic Amount: Small (1-33%) Fat Layer (Subcutaneous Tissue) Exposed: Yes Tendon Exposed: No Muscle Exposed: No Joint Exposed: No Bone Exposed: No Treatment Notes Wound #2 (Foot) Wound Laterality: Plantar, Right Cleanser Peri-Wound Care Topical Primary Dressing Riffel, Alanzo S. ([id number]) Silvercel Small 2x2 (in/in) Discharge Instruction: Apply Silvercel Small 2x2 (in/in) as instructed Secondary Dressing Gauze Discharge Instruction: As directed: dry, moistened with saline or moistened with Dakins Solution Secured With Coban Cohesive Bandage 4x5 (yds) Stretched Discharge Instruction: Apply Coban as directed. Compression Wrap Compression Stockings Add-Ons Electronic Signature(s) Signed: 11/21/2021 3:43:05 PM By: Gretta Cool, BSN, RN, CWS, Kim RN, BSN Previous Signature: 09/30/2021 12:11:05 PM Version By: Gretta Cool, BSN, RN, CWS, Kim RN, BSN Previous Signature: 10/01/2021 1:49:35 PM Version By: Massie Kluver Entered By: Gretta Cool BSN, RN, CWS, Kim on 10/31/2021 08:29:03 Charles [name] ([id number]) -------------------------------------------------------------------------------- [place] Details Patient Name: Charles [name] Date of Service: 09/30/2021 8:15 AM Medical Record Number: [id number] Patient Account Number: [id number] Date of Birth/Sex: 04-Apr-1961 (61 y.o. M)  Treating RN: Cornell Barman Primary Care Britnay Magnussen: Drema Dallas Other Clinician: Massie Kluver Referring Brooke Payes: Drema Dallas Treating Cheyne Boulden/Extender: Tito Dine in Treatment: 11 Vital Signs Time  Taken: 08:07 Temperature (F): 97.9 Weight (lbs): 230 Pulse (bpm): 65 Respiratory Rate (breaths/min): 16 Blood Pressure (mmHg): 154/84 Reference Range: 80 - 120 mg / dl Electronic Signature(s) Signed: 10/01/2021 1:49:35 PM By: Massie Kluver Entered By: Massie Kluver on 09/30/2021 08:08:51

## 2021-10-01 NOTE — Progress Notes (Signed)
LUCERO, IDE ([id number]) Visit Report for 09/30/2021 HBO Details Patient Name: Charles [name], Charles [name] Date of Service: 09/30/2021 10:00 AM Medical Record Number: [id number] Patient Account Number: [id number] Date of Birth/Sex: 1960-08-22 (61 y.o. 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 HBO Treatment Details Treatment Number: 9 Patient Type: Outpatient Chamber Type: Monoplace Chamber Serial #: [id number] Treatment Protocol: 2.0 ATA with 90 minutes oxygen, and no air breaks Treatment Details Compression Rate Down: 1.5 psi / minute De-Compression Rate Up: 1.5 psi / minute Compress Tx Pressure Air breaks and breathing periods Decompress Decompress Begins Reached (leave unused spaces blank) Begins Ends Chamber Pressure (ATA) 1 2 - - - - - - 2 1 Clock Time (24 hr) 09:00 09:10 - - - - - - 10:41 10:51 Treatment Length: 111 (minutes) Treatment Segments: 4 Vital Signs Capillary Blood Glucose Reference Range: 80 - 120 mg / dl HBO Diabetic Blood Glucose Intervention Range: <131 mg/dl or >249 mg/dl Time Vitals Blood Respiratory Capillary Blood Glucose Pulse Action Type: Pulse: Temperature: Taken: Pressure: Rate: Glucose (mg/dl): Meter #: Oximetry (%) Taken: Pre 08:54 138/84 72 16 97.9 155 1 none per protocol Post 10:57 1 66 16 98 96 1 patient felt fine; released home Treatment Response Treatment Toleration: Well Treatment Completion Treatment Completed without Adverse Event Status: Audryanna Zurita Notes No concerns with treatment given, Patient was also seen for wound care evaluation HBO  Attestation I certify that I supervised this HBO treatment in accordance with Medicare guidelines. A trained emergency response team is readily Yes available per hospital policies and procedures. Continue HBOT as ordered. Yes Electronic Signature(s) Signed: 10/01/2021 11:05:19 AM By: Linton Ham MD Previous Signature: 09/30/2021 11:47:49 AM Version By: Enedina Finner RCP, RRT, CHT Entered By: Linton Ham on 09/30/2021 16:19:27 Belair, Monte Chauncey Cruel ([id number]) -------------------------------------------------------------------------------- HBO Safety Checklist Details Patient Name: Charles [name] Date of Service: 09/30/2021 10:00 AM Medical Record Number: [id number] Patient Account Number: [id number] Date of Birth/Sex: 03/27/1961 (61 y.o. M) Treating RN: Primary Care Ruqaya Strauss: Drema Dallas Other Clinician: Jacqulyn Bath Referring Bayyinah Dukeman: Drema Dallas Treating Addley Ballinger/Extender: Tito Dine in Treatment: 11 HBO Safety Checklist Items Safety Checklist Consent Form Signed Patient voided / foley secured and emptied When did you last eato 07:00 a, Last dose of injectable or oral agent n/a Ostomy pouch emptied and vented if applicable NA All implantable devices assessed, documented and approved NA Intravenous access site secured and place NA Valuables secured Linens and cotton and cotton/polyester blend (less than 51% polyester) Personal oil-based products / skin lotions / body lotions removed Wigs or hairpieces removed NA Smoking or tobacco materials removed NA Books / newspapers / magazines / loose paper removed NA Cologne, aftershave, perfume and deodorant removed Jewelry removed (may wrap wedding band) Make-up removed NA Hair care products removed Battery operated devices (external) removed NA Heating patches and chemical warmers removed NA Titanium eyewear removed NA Nail polish cured greater than 10 hours NA Casting  material cured greater than 10 hours NA Hearing aids removed NA Loose dentures or partials removed NA Prosthetics have been removed NA Patient demonstrates correct use of air break device (if applicable) Patient concerns have been addressed Patient grounding bracelet on and cord attached to chamber  Specifics for Inpatients (complete in addition to above) Medication sheet sent with patient Intravenous medications needed or due during therapy sent with patient Drainage tubes (e.g. nasogastric tube or chest tube secured and vented) Endotracheal or Tracheotomy tube secured Cuff deflated of air and inflated with saline Airway suctioned Electronic Signature(s) Signed: 09/30/2021 11:47:49 AM By: Enedina Finner RCP, RRT, CHT Entered By: Stark Jock, Amado Nash on 09/30/2021 09:23:05

## 2021-10-01 NOTE — Progress Notes (Signed)
Charles [name], Charles [name] ([id number]) Visit Report for 09/30/2021 Debridement Details Patient Name: Charles [name], Charles [name] Date of Service: 09/30/2021 8:15 AM Medical Record Number: [id number] Patient Account Number: [id number] Date of Birth/Sex: 1960-05-18 (61 y.o. M) Treating RN: Cornell Barman Primary Care Provider: Drema Dallas Other Clinician: Massie Kluver Referring Provider: Drema Dallas Treating Provider/Extender: Tito Dine in Treatment: 11 Debridement Performed for Wound #2 Right,Plantar Foot Assessment: Performed By: Physician Ricard [name], MD Debridement Type: Debridement Severity of Tissue Pre Debridement: Fat layer exposed Level of Consciousness (Pre- Awake and Alert procedure): Pre-procedure Verification/Time Out Yes - 08:25 Taken: Start Time: 08:25 Total Area Debrided (L x W): 0.5 (cm) x 0.5 (cm) = 0.25 (cm) Tissue and other material Viable, Non-Viable, Callus, Subcutaneous debrided: Level: Skin/Subcutaneous Tissue Debridement Description: Excisional Instrument: Curette Bleeding: Minimum Hemostasis Achieved: Pressure End Time: 08:30 Response to Treatment: Procedure was tolerated well Level of Consciousness (Post- Awake and Alert procedure): Post Debridement Measurements of Total Wound Length: (cm) 0.1 Width: (cm) 0.2 Depth: (cm) 0.2 Volume: (cm) 0.003 Character of Wound/Ulcer Post Debridement: Stable Severity of Tissue Post Debridement: Fat layer exposed Post Procedure Diagnosis Same as Pre-procedure Electronic Signature(s) Signed: 09/30/2021 12:11:05 PM By: Gretta Cool, BSN, RN, CWS, Kim RN, BSN Signed: 10/01/2021 11:05:19 AM By: Linton Ham MD Signed: 10/01/2021 1:49:35 PM By: Massie Kluver Entered By: Massie Kluver on 09/30/2021 08:33:23 Charles [name] ([id number]) -------------------------------------------------------------------------------- HPI Details Patient Name: Charles [name] Date of Service: 09/30/2021 8:15  AM Medical Record Number: [id number] Patient Account Number: [id number] Date of Birth/Sex: Aug 26, 1960 (61 y.o. M) Treating RN: Cornell Barman Primary Care Provider: Drema Dallas Other Clinician: Massie Kluver Referring Provider: Drema Dallas Treating Provider/Extender: Tito Dine in Treatment: 11 History of Present Illness HPI Description: 5.7 diet controlled Workers Compensation Chart: 07/15/2021 upon evaluation today patient appears to be doing decently well in regard to a wound on the left foot first great toe amputation site. This is a wound that has been taken care of by Dr. Luana Shu up to this point at podiatry. He also has been seeing the patient for wounds that are non-Worker's Comp. related that will be dictated in a another note to this is actually the [employer] note currently. The patient sustained this injury when he was at work and apparently there was a machine that you stood beside that would come down to slice something that apparently his great toe got caught in the machine and removed a portion of this and ended up requiring a amputation of the digit completely. Subsequently this had for the most part healed but there was a small dehiscence and its not closed completely. Again this is the [employer] portion of his issues going on at this point. The good news is there does not appear to be any signs of infection right now which is great news. The patient does have several medical problems noted below. The patient does have diabetes mellitus type 2, hypertension, and coronary artery disease. Of note however his hemoglobin A1c is actually very good at 5.7 and this is diet controlled. Overall this should both well for healing with appropriate offloading which I think is going to be probably the primary focus of what we will be doing. The good news is he is also had x-rays which did not show any evidence of osteomyelitis he has not had an MRI since  surgery. With regard to the non-Worker's Comp. wounds he has 1 on the fifth metatarsal lateral region of the left foot  as well as the plantar aspect of the right foot. Again these are not Worker's Comp. related and are being treated separately. Nonetheless we were not able to document a separate note between the [employer] and the regular at this point and therefore were combining everything as I wanted to be documented and not left out. 07-24-2021 upon evaluation today patient appears to be doing a little better in regard to the wounds on the lateral aspect of his left foot as well as the right plantar foot. He is showing again some signs of increased granulation I am pleased in that regard. Fortunately I do not see any signs of active infection locally or systemically which is great news. No fevers, chills, nausea, vomiting, or diarrhea. I do think that potentially we may need to look into repeat evaluation possibly MRI of the left foot if things do not show signs of improvement previously in the area had a CT scan which showed no osteomyelitis but I believe that was January 2023. That could have changed in the interim and we need to keep a close eye on things here. 07-31-2021 upon evaluation today patient appears to be doing well with regard to his wounds. He does have bone exposed on the left lateral foot although I could not identify any necrotic bone in particular. With that being said there is a lot of callus around the edges of the plantar foot right and I am going to perform some debridement on both locations. 08-07-2021 upon evaluation today patient appears to be doing well with regard to his wounds all things considered. The right plantar foot is doing better. The left lateral foot unfortunately still shows bone in the base of the wound. There is some concern for the possibility of osteomyelitis based on what we are seeing his previous CT scan did not show this but that was back in January  3 months past. At this point the wound does appear to be deeper with bone exposure and subsequently I do believe that there is a great risk year of there actually being some infection present. Subsequently we have attempted to order a repeat CT scan this has been denied by insurance I have no documentation as to why that may be. The patient has not received anything and this was the first he heard of it this morning we just found out yesterday. Nonetheless I am going to have him contact them he offered and stated that he would give them a call anyway I am definitely okay with that. Depending on what he can find out or if he can even get any information for me to get in touch with someone I will be happy to discuss the case with a peer to peer reviewer as well. 08-21-2021 upon evaluation today patient appears to be doing okay in regard to his wounds. He still has not found out any information regarding the CT scan with his insurance. We are going to need to try to follow-up on this as honestly he really does need a CT scan to see what exactly is going on and to be honest I think that a peer to peer review would likely achieve the what we need as far as getting this done. He did have a repeat x-ray with podiatry and apparently they saw some stuff that did make him concerned about the fact that he needs the CT scan as well they are unsure why he is being denied. I am in complete agreement as  far as that is concerned. 08-28-2021 upon evaluation today patient unfortunately appears to be doing significantly worse in regard to his left leg. He actually has erythema all the way up from the foot to his knee. This is significantly swollen as well. Again we have been trying to get a CT scan for several weeks and insurance has denied this. Stating it was not medically necessary. With that being said I do believe it is and I do believe he likely has osteomyelitis this has been stated by both myself as well as his  podiatrist. Nonetheless we have not been up to this point able to get approval to proceed with the imaging study. At this point I believe the patient is going require ER evaluation and likely admission to the hospital to get the infection under control this appears to be a quite significant infection of the left lower extremity extending again from the foot all the way up to the knee. 09-04-2021 upon evaluation today patient appears to be doing he tells me much better compared to last time I saw him. He subsequently went to the hospital after I saw him last he was having a lot of issues. We have been denied by insurance up to that point for obtaining the CT scan which I felt like was necessary in order to evaluate and confirm the extent and quantify the osteomyelitis that I felt was likely present this was true of the podiatrist as well but we were unable to obtain that approval. Subsequently when the patient came in he was looking significantly worse ended up sending him to the hospital for further evaluation and treatment. That was actually on 08-28-2021. Subsequently has been in the hospital until yesterday and he did have 1/5 digit and partial ray amputation which was undertaken on 08-30-2021. This was due to osteomyelitis. I do have the results of the pathology as well which shows that the patient does have osteomyelitis which involves the bone margin. This means that Vonstein, Jakyron S. ([id number]) he has not completely clear the osteomyelitis and for this reason he has been placed on oral antibiotics as well he is taking amoxicillin currently due to Enterococcus that is the pathologic organism that was noted on evaluation. With that being said this means that he is still at risk for this worsening and to be perfectly honest I do believe he is not only a candidate for HBO therapy but it would probably be a really good option for him to undertake HBO therapy in order to try and get this infection  completely cleared so it does not continue to be an issue for him and end up with further amputation. The patient voiced understanding he is actually in agreement the plan. We just need to continue with the approval process for this. 09-16-2021 upon evaluation today patient's wound on the left foot in regard to the third toe actually appears to be healed based on what I am seeing I do not see anything open at this point he has had no drainage she tells me for the past week. With that being said there does appear to be new skin I think we still need to keep a close eye on things here. With that being said his right foot on the plantar aspect is still showing signs of being open there is quite a bit of callus buildup he is back to work at this point. This is going require some sharp debridement today. . 6/13; this patient is going  undergoing hyperbaric oxygen for osteomyelitis in the lateral part of his left foot. He also has a wound on his left third toe. I think he is using Betadine. Everything looks satisfactory here. HOWEVER he comes in complaining of pain in the right foot transmetatarsal amputation that is where his original wound was. The wound is small but with undermining I used a #3 curette to fully expose the wound with which looks healthy. Furthermore he has a blister and thick callus on the tip of his foot Electronic Signature(s) Signed: 10/01/2021 11:05:19 AM By: Linton Ham MD Entered By: Linton Ham on 09/30/2021 08:47:14 Getman, Saud Chauncey [name] ([id number]) -------------------------------------------------------------------------------- Physical Exam Details Patient Name: Charles [name] Date of Service: 09/30/2021 8:15 AM Medical Record Number: [id number] Patient Account Number: [id number] Date of Birth/Sex: 1961-03-28 (61 y.o. M) Treating RN: Cornell Barman Primary Care Provider: Drema Dallas Other Clinician: Massie Kluver Referring Provider: Drema Dallas Treating  Provider/Extender: Tito Dine in Treatment: 11 Constitutional Patient is hypertensive.. Pulse regular and within target range for patient.Marland Kitchen Respirations regular, non-labored and within target range.. Temperature is normal and within the target range for the patient.Marland Kitchen appears in no distress. Notes Wound exam; he has a small area on the left third toe and an area on the left lateral foot which is the area with the underlying osteomyelitis everything looks satisfactory here. Problematically however he is wound on the plantar aspect of his right foot is small but there was undermining about the size of the head of a #3 curette I used a #3 curette to remove skin and expose the surface. This looked healthy however it is still not close to healing. He comes in today with blisters on the tip of the TMA site on the right I pared down the callus remove the fluid. He is complaining of pain on his right lateral foot however there is nothing open here and really no evidence of infection there was no tenderness no erythema Electronic Signature(s) Signed: 10/01/2021 11:05:19 AM By: Linton Ham MD Entered By: Linton Ham on 09/30/2021 08:51:02 Charles [name] ([id number]) -------------------------------------------------------------------------------- Physician Orders Details Patient Name: Charles [name] Date of Service: 09/30/2021 8:15 AM Medical Record Number: [id number] Patient Account Number: [id number] Date of Birth/Sex: 02/06/1961 (62 y.o. M) Treating RN: Cornell Barman Primary Care Provider: Drema Dallas Other Clinician: Massie Kluver Referring Provider: Drema Dallas Treating Provider/Extender: Tito Dine in Treatment: 66 Verbal / Phone Orders: No Diagnosis Coding Follow-up Appointments o Return Appointment in 1 week. Bathing/ Shower/ Hygiene o May shower; gently cleanse wound with antibacterial soap, rinse and pat dry prior to dressing  wounds Edema Control - Lymphedema / Segmental Compressive Device / Other o Elevate, Exercise Daily and Avoid Standing for Long Periods of Time. o Elevate legs to the level of the heart and pump ankles as often as possible o Elevate leg(s) parallel to the floor when sitting. Off-Loading o Open toe surgical shoe Hyperbaric Oxygen Therapy o Evaluate for HBO Therapy - Left foot chronic osteomyelitis confirmed still involving the margin following partial 5th ray amputation o Indication and location: - osteomyelitis left 5th metatarsal amputation site o If appropriate for treatment, begin HBOT per protocol: o 2.0 ATA for 90 Minutes without Air Breaks o One treatment per day (delivered Monday through Friday unless otherwise specified in Special Instructions below): o Total # of Treatments: - 40 o Antihistamine 30 minutes prior to HBO Treatment, difficulty clearing ears. o Finger stick Blood Glucose Pre- and Post- HBOT Treatment. o  Follow Hyperbaric Oxygen Glycemia Protocol Wound Treatment Wound #2 - Foot Wound Laterality: Plantar, Right Primary Dressing: Silvercel Small 2x2 (in/in) (DME) (Generic) 1 x Per Day/30 Days Discharge Instructions: Apply Silvercel Small 2x2 (in/in) as instructed Secondary Dressing: Gauze (DME) (Generic) 1 x Per Day/30 Days Discharge Instructions: As directed Secured With: Coban Cohesive Bandage 4x5 (yds) Stretched (DME) (Generic) 1 x Per Day/30 Days Discharge Instructions: Apply Coban as directed. GLYCEMIA INTERVENTIONS PROTOCOL PRE-HBO GLYCEMIA INTERVENTIONS ACTION INTERVENTION Obtain pre-HBO capillary blood glucose (ensure 1 physician order is in chart). A. Notify HBO physician and await physician orders. 2 If result is 70 mg/dl or below: B. If the result meets the hospital definition of a critical result, follow hospital policy. A. Give patient an 8 ounce Glucerna Shake, an 8 ounce Ensure, or 8 ounces of a Glucerna/Ensure  equivalent dietary supplement*. B. Wait 30 minutes. If result is 71 mg/dl to 130 mg/dl: C. Retest patientos capillary blood glucose (CBG). D. If result greater than or equal to 110 mg/dl, proceed with HBO. If result less than 110 mg/dl, notify HBO physician and consider holding HBO. If result is 131 mg/dl to 249 mg/dl: A. Proceed with HBO. Jian, Kaushik S. ([id number]) If result is 250 mg/dl or greater: A. Notify HBO physician and await physician orders. B. It is recommended to hold HBO and do blood/urine ketone testing. C. If the result meets the hospital definition of a critical result, follow hospital policy. POST-HBO GLYCEMIA INTERVENTIONS ACTION INTERVENTION Obtain post HBO capillary blood glucose (ensure 1 physician order is in chart). A. Notify HBO physician and await physician orders. 2 If result is 70 mg/dl or below: B. If the result meets the hospital definition of a critical result, follow hospital policy. A. Give patient an 8 ounce Glucerna Shake, an 8 ounce Ensure, or 8 ounces of a Glucerna/Ensure equivalent dietary supplement*. B. Wait 15 minutes for symptoms of hypoglycemia (i.e. nervousness, anxiety, If result is 71 mg/dl to 100 mg/dl: sweating, chills, clamminess, irritability, confusion, tachycardia or dizziness). C. If patient asymptomatic, discharge patient. If patient symptomatic, repeat capillary blood glucose (CBG) and notify HBO physician. If result is 101 mg/dl to 249 mg/dl: A. Discharge patient. A. Notify HBO physician and await physician orders. B. It is recommended to do blood/urine If result is 250 mg/dl or greater: ketone testing. C. If the result meets the hospital definition of a critical result, follow hospital policy. *Juice or candies are NOT equivalent products. If patient refuses the Glucerna or Ensure, please consult the hospital dietitian for an appropriate substitute. Electronic Signature(s) Signed: 10/01/2021 11:05:19 AM  By: Linton Ham MD Signed: 10/01/2021 1:49:35 PM By: Massie Kluver Entered By: Massie Kluver on 09/30/2021 08:53:17 Mcsorley, Jaquille Chauncey [name] ([id number]) -------------------------------------------------------------------------------- Problem List Details Patient Name: Charles [name] Date of Service: 09/30/2021 8:15 AM Medical Record Number: [id number] Patient Account Number: [id number] Date of Birth/Sex: 03-31-1961 (61 y.o. M) Treating RN: Cornell Barman Primary Care Provider: Drema Dallas Other Clinician: Massie Kluver Referring Provider: Drema Dallas Treating Provider/Extender: Tito Dine in Treatment: 11 Active Problems ICD-10 Encounter Code Description Active Date MDM Diagnosis E11.621 Type 2 diabetes mellitus with foot ulcer 07/15/2021 No Yes M86.672 Other chronic osteomyelitis, left ankle and foot 09/04/2021 No Yes L97.522 Non-pressure chronic ulcer of other part of left foot with fat layer 07/24/2021 No Yes exposed L97.512 Non-pressure chronic ulcer of other part of right foot with fat layer 07/24/2021 No Yes exposed Duncan (primary) hypertension 07/15/2021 No Yes I25.10 Atherosclerotic heart disease of native  coronary artery without angina 07/15/2021 No Yes pectoris Inactive Problems Resolved Problems Electronic Signature(s) Signed: 10/01/2021 11:05:19 AM By: Linton Ham MD Entered By: Linton Ham on 09/30/2021 08:41:48 Bowdish, Viraat Chauncey [name] ([id number]) -------------------------------------------------------------------------------- Progress Note Details Patient Name: Charles [name] Date of Service: 09/30/2021 8:15 AM Medical Record Number: [id number] Patient Account Number: [id number] Date of Birth/Sex: 18-Oct-1960 (61 y.o. M) Treating RN: Cornell Barman Primary Care Provider: Drema Dallas Other Clinician: Massie Kluver Referring Provider: Drema Dallas Treating Provider/Extender: Tito Dine in Treatment:  11 Subjective History of Present Illness (HPI) 5.7 diet controlled Workers Compensation Chart: 07/15/2021 upon evaluation today patient appears to be doing decently well in regard to a wound on the left foot first great toe amputation site. This is a wound that has been taken care of by Dr. Luana Shu up to this point at podiatry. He also has been seeing the patient for wounds that are non-Worker's Comp. related that will be dictated in a another note to this is actually the [employer] note currently. The patient sustained this injury when he was at work and apparently there was a machine that you stood beside that would come down to slice something that apparently his great toe got caught in the machine and removed a portion of this and ended up requiring a amputation of the digit completely. Subsequently this had for the most part healed but there was a small dehiscence and its not closed completely. Again this is the [employer] portion of his issues going on at this point. The good news is there does not appear to be any signs of infection right now which is great news. The patient does have several medical problems noted below. The patient does have diabetes mellitus type 2, hypertension, and coronary artery disease. Of note however his hemoglobin A1c is actually very good at 5.7 and this is diet controlled. Overall this should both well for healing with appropriate offloading which I think is going to be probably the primary focus of what we will be doing. The good news is he is also had x-rays which did not show any evidence of osteomyelitis he has not had an MRI since surgery. With regard to the non-Worker's Comp. wounds he has 1 on the fifth metatarsal lateral region of the left foot as well as the plantar aspect of the right foot. Again these are not Worker's Comp. related and are being treated separately. Nonetheless we were not able to document a separate note between the [employer] and the regular at this point and therefore were combining everything as I wanted to be documented and not left out. 07-24-2021 upon evaluation today patient appears to be doing a little better in regard to the wounds on the lateral aspect of his left foot as well as the right plantar foot. He is showing again some signs of increased granulation I am pleased in that regard. Fortunately I do not see any signs of active infection locally or systemically which is great news. No fevers, chills, nausea, vomiting, or diarrhea. I do think that potentially we may need to look into repeat evaluation possibly MRI of the left foot if things do not show signs of improvement previously in the area had a CT scan which showed no osteomyelitis but I believe that was January 2023. That could have changed in the interim and we need to keep a close eye on things here. 07-31-2021 upon evaluation today patient appears to be doing well with regard  to his wounds. He does have bone exposed on the left lateral foot although I could not identify any necrotic bone in particular. With that being said there is a lot of callus around the edges of the plantar foot right and I am going to perform some debridement on both locations. 08-07-2021 upon evaluation today patient appears to be doing well with regard to his wounds all things considered. The right plantar foot is doing better. The left lateral foot unfortunately still shows bone in the base of the wound. There is some concern for the possibility of osteomyelitis based on what we are seeing his previous CT scan did not show this but that was back in January 3 months past. At this point the wound does appear to be deeper with bone exposure and subsequently I do believe that there is a great risk year of there actually being some infection present. Subsequently we have attempted to order a repeat CT scan this has been denied by insurance I have no documentation as to why  that may be. The patient has not received anything and this was the first he heard of it this morning we just found out yesterday. Nonetheless I am going to have him contact them he offered and stated that he would give them a call anyway I am definitely okay with that. Depending on what he can find out or if he can even get any information for me to get in touch with someone I will be happy to discuss the case with a peer to peer reviewer as well. 08-21-2021 upon evaluation today patient appears to be doing okay in regard to his wounds. He still has not found out any information regarding the CT scan with his insurance. We are going to need to try to follow-up on this as honestly he really does need a CT scan to see what exactly is going on and to be honest I think that a peer to peer review would likely achieve the what we need as far as getting this done. He did have a repeat x-ray with podiatry and apparently they saw some stuff that did make him concerned about the fact that he needs the CT scan as well they are unsure why he is being denied. I am in complete agreement as far as that is concerned. 08-28-2021 upon evaluation today patient unfortunately appears to be doing significantly worse in regard to his left leg. He actually has erythema all the way up from the foot to his knee. This is significantly swollen as well. Again we have been trying to get a CT scan for several weeks and insurance has denied this. Stating it was not medically necessary. With that being said I do believe it is and I do believe he likely has osteomyelitis this has been stated by both myself as well as his podiatrist. Nonetheless we have not been up to this point able to get approval to proceed with the imaging study. At this point I believe the patient is going require ER evaluation and likely admission to the hospital to get the infection under control this appears to be a quite significant infection of the left lower  extremity extending again from the foot all the way up to the knee. 09-04-2021 upon evaluation today patient appears to be doing he tells me much better compared to last time I saw him. He subsequently went to the hospital after I saw him last he was having a lot of issues.  We have been denied by insurance up to that point for obtaining the CT scan which I felt like was necessary in order to evaluate and confirm the extent and quantify the osteomyelitis that I felt was likely present this was true of the podiatrist as well but we were unable to obtain that approval. Subsequently when the patient came in he was looking significantly worse ended up sending him to the hospital for further evaluation and treatment. That was actually on 08-28-2021. Subsequently has been in the hospital until yesterday and he did have 1/5 digit and partial ray amputation which was undertaken on 08-30-2021. This was due to osteomyelitis. I do have the results of the pathology as well which shows that the patient does have osteomyelitis which involves the bone margin. This means that Covey, Joshaua S. ([id number]) he has not completely clear the osteomyelitis and for this reason he has been placed on oral antibiotics as well he is taking amoxicillin currently due to Enterococcus that is the pathologic organism that was noted on evaluation. With that being said this means that he is still at risk for this worsening and to be perfectly honest I do believe he is not only a candidate for HBO therapy but it would probably be a really good option for him to undertake HBO therapy in order to try and get this infection completely cleared so it does not continue to be an issue for him and end up with further amputation. The patient voiced understanding he is actually in agreement the plan. We just need to continue with the approval process for this. 09-16-2021 upon evaluation today patient's wound on the left foot in regard to the third  toe actually appears to be healed based on what I am seeing I do not see anything open at this point he has had no drainage she tells me for the past week. With that being said there does appear to be new skin I think we still need to keep a close eye on things here. With that being said his right foot on the plantar aspect is still showing signs of being open there is quite a bit of callus buildup he is back to work at this point. This is going require some sharp debridement today. . 6/13; this patient is going undergoing hyperbaric oxygen for osteomyelitis in the lateral part of his left foot. He also has a wound on his left third toe. I think he is using Betadine. Everything looks satisfactory here. HOWEVER he comes in complaining of pain in the right foot transmetatarsal amputation that is where his original wound was. The wound is small but with undermining I used a #3 curette to fully expose the wound with which looks healthy. Furthermore he has a blister and thick callus on the tip of his foot Objective Constitutional Patient is hypertensive.. Pulse regular and within target range for patient.Marland Kitchen Respirations regular, non-labored and within target range.. Temperature is normal and within the target range for the patient.Marland Kitchen appears in no distress. Vitals Time Taken: 8:07 AM, Weight: 230 lbs, Temperature: 97.9 F, Pulse: 65 bpm, Respiratory Rate: 16 breaths/min, Blood Pressure: 154/84 mmHg. General Notes: Wound exam; he has a small area on the left third toe and an area on the left lateral foot which is the area with the underlying osteomyelitis everything looks satisfactory here. Problematically however he is wound on the plantar aspect of his right foot is small but there was undermining about the size of the head  of a #3 curette I used a #3 curette to remove skin and expose the surface. This looked healthy however it is still not close to healing. He comes in today with blisters on the tip of  the TMA site on the right I pared down the callus remove the fluid. He is complaining of pain on his right lateral foot however there is nothing open here and really no evidence of infection there was no tenderness no erythema Integumentary (Hair, Skin) Wound #2 status is Open. Original cause of wound was Gradually Appeared. The date acquired was: 04/20/2020. The wound has been in treatment 11 weeks. The wound is located on the [place]. The wound measures 0.1cm length x 0.2cm width x 0.2cm depth; 0.016cm^2 area and 0.003cm^3 volume. There is Fat Layer (Subcutaneous Tissue) exposed. There is a medium amount of serosanguineous drainage noted. There is large (67-100%) red, pink granulation within the wound bed. There is a small (1-33%) amount of necrotic tissue within the wound bed including Adherent Slough. Assessment Active Problems ICD-10 Type 2 diabetes mellitus with foot ulcer Other chronic osteomyelitis, left ankle and foot Non-pressure chronic ulcer of other part of left foot with fat layer exposed Non-pressure chronic ulcer of other part of right foot with fat layer exposed Essential (primary) hypertension Atherosclerotic heart disease of native coronary artery without angina pectoris Procedures Wound #2 Pre-procedure diagnosis of Wound #2 is a Diabetic Wound/Ulcer of the Lower Extremity located on the Right,Plantar Foot .Severity of Tissue Pre Debridement is: Fat layer exposed. There was a Excisional Skin/Subcutaneous Tissue Debridement with a total area of 0.25 sq cm performed by Michalski, Gunnar S. ([id number]) Neleh Muldoon, Memory Argue, MD. With the following instrument(s): Curette to remove Viable and Non-Viable tissue/material. Material removed includes Callus and Subcutaneous Tissue and. A time out was conducted at 08:25, prior to the start of the procedure. A Minimum amount of bleeding was controlled with Pressure. The procedure was tolerated well. Post Debridement Measurements:  0.1cm length x 0.2cm width x 0.2cm depth; 0.003cm^3 volume. Character of Wound/Ulcer Post Debridement is stable. Severity of Tissue Post Debridement is: Fat layer exposed. Post procedure Diagnosis Wound #2: Same as Pre-Procedure Plan Follow-up Appointments: Return Appointment in 1 week. Bathing/ Shower/ Hygiene: May shower; gently cleanse wound with antibacterial soap, rinse and pat dry prior to dressing wounds Edema Control - Lymphedema / Segmental Compressive Device / Other: Elevate, Exercise Daily and Avoid Standing for Long Periods of Time. Elevate legs to the level of the heart and pump ankles as often as possible Elevate leg(s) parallel to the floor when sitting. Off-Loading: Open toe surgical shoe Hyperbaric Oxygen Therapy: Evaluate for HBO Therapy - Left foot chronic osteomyelitis confirmed still involving the margin following partial 5th ray amputation Indication and location: - osteomyelitis left 5th metatarsal amputation site If appropriate for treatment, begin HBOT per protocol: 2.0 ATA for 90 Minutes without Air Breaks One treatment per day (delivered Monday through Friday unless otherwise specified in Special Instructions below): Total # of Treatments: - 40 Antihistamine 30 minutes prior to HBO Treatment, difficulty clearing ears. Finger stick Blood Glucose Pre- and Post- HBOT Treatment. Follow Hyperbaric Oxygen Glycemia Protocol WOUND #2: - Foot Wound Laterality: Plantar, Right Primary Dressing: Silvercel Small 2x2 (in/in) (DME) (Generic) 1 x Per Day/30 Days Discharge Instructions: Apply Silvercel Small 2x2 (in/in) as instructed Secondary Dressing: Gauze (DME) (Generic) 1 x Per Day/30 Days Discharge Instructions: As directed: dry, moistened with saline or moistened with Dakins Solution Secured With: Coban Cohesive Bandage 4x5 (  yds) Stretched (DME) (Generic) 1 x Per Day/30 Days Discharge Instructions: Apply Coban as directed. 1. I continued with the Betadine on the left  foot 2. Silver alginate to the 2 areas on the right foot including the blistered TMA and the midfoot wound. 3. I asked him to go back to his surgical shoe on the right. 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 4. We put silver alginate on the wound areas heel cups gauze in his surgical shoe on the right 5. I am hopeful with offloading this better that the pain on the lateral foot will go away I did not see any other alternative explanation Electronic Signature(s) Signed: 10/01/2021 11:05:19 AM By: Linton Ham MD Entered By: Linton Ham on 09/30/2021 08:52:13 Bosque, Kenyon Chauncey [name] ([id number]) -------------------------------------------------------------------------------- SuperBill Details Patient Name: Charles [name] Date of Service: 09/30/2021 Medical Record Number: [id number] Patient Account Number: [id number] Date of Birth/Sex: 1960-11-19 (61 y.o. M) Treating RN: Cornell Barman Primary Care Provider: Drema Dallas Other Clinician: Massie Kluver Referring Provider: Drema Dallas Treating Provider/Extender: Tito Dine in Treatment: 11 Diagnosis Coding ICD-10 Codes Code Description E11.621 Type 2 diabetes mellitus with foot ulcer M86.672 Other chronic osteomyelitis, left ankle and foot L97.522 Non-pressure chronic ulcer of other part of left foot with fat layer exposed L97.512 Non-pressure chronic ulcer of other part of right foot with fat layer exposed I10 Essential (primary) hypertension I25.10 Atherosclerotic heart disease of native coronary artery without angina pectoris Facility Procedures CPT4 Code: 10175102 Description: 58527 - DEB SUBQ TISSUE 20 SQ CM/< Modifier: Quantity: 1 CPT4 Code: Description: ICD-10 Diagnosis Description L97.512 Non-pressure chronic ulcer of other part of right foot with fat layer ex Modifier: posed Quantity: Physician Procedures CPT4 Code: 7824235 Description: 11042 - WC PHYS SUBQ  TISS 20 SQ CM Modifier: Quantity: 1 CPT4 Code: Description: ICD-10 Diagnosis Description L97.512 Non-pressure chronic ulcer of other part of right foot with fat layer ex Modifier: posed Quantity: Electronic Signature(s) Signed: 10/01/2021 11:05:19 AM By: Linton Ham MD Entered By: Linton Ham on 09/30/2021 08:52:23

## 2021-10-01 NOTE — Progress Notes (Addendum)
CHORD, TAKAHASHI ([id number]) Visit Report for 10/01/2021 HBO Details Patient Name: Charles [name], Charles [name] Date of Service: 10/01/2021 8:00 AM Medical Record Number: [id number] Patient Account Number: [id number] Date of Birth/Sex: [birth year] (61 y.o. M) Treating RN: Cornell Barman Primary Care Nader Boys: Drema Dallas Other Clinician: Jacqulyn Bath Referring Mairin Lindsley: Drema Dallas Treating Levana Minetti/Extender: Yaakov Guthrie in Treatment: 11 HBO Treatment Course Details Treatment Course Number: 1 Ordering Aaleah Hirsch: Jeri Cos Total Treatments Ordered: 40 HBO Treatment Start Date: 09/16/2021 HBO Indication: Chronic Refractory Osteomyelitis to Left 5th Ray Metatarsal Amputation Site HBO Treatment Details Treatment Number: 10 Patient Type: Outpatient Chamber Type: Monoplace Chamber Serial #: [id number] Treatment Protocol: 2.0 ATA with 90 minutes oxygen, and no air breaks Treatment Details Compression Rate Down: 1.5 psi / minute De-Compression Rate Up: 1.5 psi / minute Compress Tx Pressure Air breaks and breathing periods Decompress Decompress Begins Reached (leave unused spaces blank) Begins Ends Chamber Pressure (ATA) 1 2 - - - - - - 2 1 Clock Time (24 hr) 08:35 08:45 - - - - - - 10:16 10:26 Treatment Length: 111 (minutes) Treatment Segments: 4 Vital Signs Capillary Blood Glucose Reference Range: 80 - 120 mg / dl HBO Diabetic Blood Glucose Intervention Range: <131 mg/dl or >249 mg/dl Time Vitals Blood Respiratory Capillary Blood Glucose Pulse Action Type: Pulse: Temperature: Taken: Pressure: Rate: Glucose (mg/dl): Meter #: Oximetry (%) Taken: Pre 08:04 142/84 66 16 98.1 97 1 Ensure given per protocol Pre 08:28 178 1 none per protocol Post 10:31 142/88 60 16 98.2 95 1 ok to leave per MD Treatment Response Treatment Toleration: Well Treatment Completion Treatment Completed without Adverse Event Status: Treatment Notes Patient had eaten a chicken sandwich an hour  before he came in. HBO Attestation I certify that I supervised this HBO treatment in accordance with Medicare guidelines. A trained emergency response team is readily Yes available per hospital policies and procedures. Continue HBOT as ordered. Yes Electronic Signature(s) Signed: 10/01/2021 4:11:59 PM By: Kalman Shan DO Previous Signature: 10/01/2021 1:34:33 PM Version By: Enedina Finner RCP, RRT, CHT Previous Signature: 10/01/2021 1:49:53 PM Version By: Kalman Shan DO Previous Signature: 10/01/2021 11:07:29 AM Version By: Enedina Finner RCP, RRT, CHT Entered By: Kalman Shan on 10/01/2021 16:04:55 Asmus, Bilbo S. ([id number]) Elly Modena, Khris Chauncey Cruel ([id number]) -------------------------------------------------------------------------------- HBO Safety Checklist Details Patient Name: Charles [name] Date of Service: 10/01/2021 8:00 AM Medical Record Number: [id number] Patient Account Number: [id number] Date of Birth/Sex: November 18, 1960 (61 y.o. M) Treating RN: Primary Care Calene Paradiso: Drema Dallas Other Clinician: Jacqulyn Bath Referring Hanzel Pizzo: Drema Dallas Treating Jedidiah Demartini/Extender: Yaakov Guthrie in Treatment: 11 HBO Safety Checklist Items Safety Checklist Consent Form Signed Patient voided / foley secured and emptied When did you last eato 07:00 am Last dose of injectable or oral agent n/a Ostomy pouch emptied and vented if applicable NA All implantable devices assessed, documented and approved NA Intravenous access site secured and place NA Valuables secured Linens and cotton and cotton/polyester blend (less than 51% polyester) Personal oil-based products / skin lotions / body lotions removed Wigs or hairpieces removed NA Smoking or tobacco materials removed NA Books / newspapers / magazines / loose paper removed NA Cologne, aftershave, perfume and deodorant removed Jewelry removed (may wrap wedding band) Make-up  removed NA Hair care products removed Battery operated devices (external) removed NA Heating patches and chemical warmers removed NA Titanium eyewear removed NA Nail polish cured greater than 10 hours NA Casting material cured greater than 10 hours NA Hearing aids removed NA Loose  dentures or partials removed NA Prosthetics have been removed NA Patient demonstrates correct use of air break device (if applicable) Patient concerns have been addressed Patient grounding bracelet on and cord attached to chamber Specifics for Inpatients (complete in addition to above) Medication sheet sent with patient Intravenous medications needed or due during therapy sent with patient Drainage tubes (e.g. nasogastric tube or chest tube secured and vented) Endotracheal or Tracheotomy tube secured Cuff deflated of air and inflated with saline Airway suctioned Electronic Signature(s) Signed: 10/01/2021 11:07:29 AM By: Enedina Finner RCP, RRT, CHT Entered By: Stark Jock, Amado Nash on 10/01/2021 08:49:27

## 2021-10-02 ENCOUNTER — Other Ambulatory Visit: Payer: Self-pay | Admitting: Physician Assistant

## 2021-10-02 ENCOUNTER — Telehealth: Payer: Self-pay

## 2021-10-02 ENCOUNTER — Encounter: Payer: Managed Care, Other (non HMO) | Admitting: Physician Assistant

## 2021-10-02 DIAGNOSIS — G4719 Other hypersomnia: Secondary | ICD-10-CM

## 2021-10-02 DIAGNOSIS — E11621 Type 2 diabetes mellitus with foot ulcer: Secondary | ICD-10-CM | POA: Diagnosis not present

## 2021-10-02 LAB — GLUCOSE, CAPILLARY
Glucose-Capillary: 150 mg/dL — ABNORMAL HIGH (ref 70–99)
Glucose-Capillary: 99 mg/dL (ref 70–99)

## 2021-10-02 MED ORDER — LISDEXAMFETAMINE DIMESYLATE 40 MG PO CAPS: 40.0000 mg | ORAL_CAPSULE | Freq: Every morning | ORAL | 0 refills | Status: DC

## 2021-10-02 NOTE — Progress Notes (Addendum)
KAIRAV, RUSSOMANNO ([id number]) Visit Report for 10/02/2021 HBO Details Patient Name: JUJUAN, DUGO Date of Service: 10/02/2021 8:00 AM Medical Record Number: [id number] Patient Account Number: [id number] Date of Birth/Sex: [birth year] (61 y.o. M) Treating RN: Levora Dredge Primary Care Marshia Tropea: Drema Dallas Other Clinician: Jacqulyn Bath Referring Oma Marzan: Drema Dallas Treating Jessiah Wojnar/Extender: Skipper Cliche in Treatment: 11 HBO Treatment Course Details Treatment Course Number: 1 Ordering Tobias Avitabile: Jeri Cos Total Treatments Ordered: 27 HBO Treatment 09/16/2021 Start Date: HBO Indication: Chronic Refractory Osteomyelitis to Left 5th Ray Metatarsal HBO Treatment 10/02/2021 Amputation Site End Date: HBO Discharge Treatment Series Incomplete; Patient Choice- Outcome: Clinical Condition Secondary to HBO HBO Treatment Details Treatment Number: 11 Patient Type: Outpatient Chamber Type: Monoplace Chamber Serial #: [id number] Treatment Protocol: 2.0 ATA with 90 minutes oxygen, and no air breaks Treatment Details Compression Rate Down: 1.5 psi / minute De-Compression Rate Up: 1.5 psi / minute Compress Tx Pressure Air breaks and breathing periods Decompress Decompress Begins Reached (leave unused spaces blank) Begins Ends Chamber Pressure (ATA) 1 2 - - - - - - 2 1 Clock Time (24 hr) 08:13 08:23 - - - - - - 09:53 10:03 Treatment Length: 110 (minutes) Treatment Segments: 4 Vital Signs Capillary Blood Glucose Reference Range: 80 - 120 mg / dl HBO Diabetic Blood Glucose Intervention Range: <131 mg/dl or >249 mg/dl Time Vitals Blood Respiratory Capillary Blood Glucose Pulse Action Type: Pulse: Temperature: Taken: Pressure: Rate: Glucose (mg/dl): Meter #: Oximetry (%) Taken: Pre 08:07 150/80 84 16 98.1 150 1 none per protocol Post 10:13 144/92 72 16 99 1 feels fine; released to home Treatment Response Treatment Toleration: Well Treatment Completion Treatment  Completed without Adverse Event Status: Electronic Signature(s) Signed: 10/31/2021 8:30:50 AM By: Gretta Cool, BSN, RN, CWS, Kim RN, BSN Signed: 10/31/2021 4:36:28 PM By: Worthy Keeler PA-C Previous Signature: 10/02/2021 10:52:50 AM Version By: Enedina Finner RCP, RRT, CHT Previous Signature: 10/02/2021 4:11:01 PM Version By: Worthy Keeler PA-C Entered By: Gretta Cool, BSN, RN, CWS, Kim on 10/31/2021 08:30:49 Rung, Jeanette Chauncey Cruel ([id number]) -------------------------------------------------------------------------------- HBO Safety Checklist Details Patient Name: [name] Date of Service: 10/02/2021 8:00 AM Medical Record Number: [id number] Patient Account Number: [id number] Date of Birth/Sex: December 28, 1960 (61 y.o. M) Treating RN: Levora Dredge Primary Care Jalyah Weinheimer: Drema Dallas Other Clinician: Jacqulyn Bath Referring Verdene Creson: Drema Dallas Treating Myha Arizpe/Extender: Skipper Cliche in Treatment: 11 HBO Safety Checklist Items Safety Checklist Consent Form Signed Patient voided / foley secured and emptied When did you last eato 10/01/21/pm Last dose of injectable or oral agent n/a Ostomy pouch emptied and vented if applicable NA All implantable devices assessed, documented and approved NA Intravenous access site secured and place NA Valuables secured Linens and cotton and cotton/polyester blend (less than 51% polyester) Personal oil-based products / skin lotions / body lotions removed Wigs or hairpieces removed NA Smoking or tobacco materials removed NA Books / newspapers / magazines / loose paper removed NA Cologne, aftershave, perfume and deodorant removed Jewelry removed (may wrap wedding band) Make-up removed NA Hair care products removed Battery operated devices (external) removed NA Heating patches and chemical warmers removed NA Titanium eyewear removed NA Nail polish cured greater than 10 hours NA Casting material cured greater than 10  hours NA Hearing aids removed NA Loose dentures or partials removed NA Prosthetics have been removed NA Patient demonstrates correct use of air break device (if applicable) Patient concerns have been addressed Patient grounding bracelet on and cord attached to chamber Specifics for Inpatients (complete  in addition to above) Medication sheet sent with patient Intravenous medications needed or due during therapy sent with patient Drainage tubes (e.g. nasogastric tube or chest tube secured and vented) Endotracheal or Tracheotomy tube secured Cuff deflated of air and inflated with saline Airway suctioned Electronic Signature(s) Signed: 10/02/2021 10:52:50 AM By: Enedina Finner RCP, RRT, CHT Entered By: Enedina Finner on 10/02/2021 09:39:06

## 2021-10-02 NOTE — Progress Notes (Signed)
KIMO, BANCROFT ([id number]) Visit Report for 10/02/2021 Arrival Information Details Patient Name: FRANSICO, SCIANDRA Date of Service: 10/02/2021 8:00 AM Medical Record Number: [id number] Patient Account Number: [id number] Date of Birth/Sex: 1960/09/25 (61 y.o. M) Treating RN: Levora Dredge Primary Care Avory Rahimi: Drema Dallas Other Clinician: Jacqulyn Bath Referring Sharae Zappulla: Drema Dallas Treating Hildagarde Holleran/Extender: Skipper Cliche in Treatment: 11 Visit Information History Since Last Visit Added or deleted any medications: No Patient Arrived: Ambulatory Any new allergies or adverse reactions: No Arrival Time: 08:05 Had a fall or experienced change in No Accompanied By: self activities of daily living that may affect Transfer Assistance: None risk of falls: Patient Identification Verified: Yes Signs or symptoms of abuse/neglect since last visito No Secondary Verification Process Completed: Yes Hospitalized since last visit: No Patient Requires Transmission-Based Precautions: No Implantable device outside of the clinic excluding No Patient Has Alerts: Yes cellular tissue based products placed in the center Patient Alerts: DIABETIC since last visit: Has Dressing in Place as Prescribed: Yes Pain Present Now: No Electronic Signature(s) Signed: 10/02/2021 10:52:50 AM By: Enedina Finner RCP, RRT, CHT Entered By: Stark Jock, Amado Nash on 10/02/2021 09:37:07 Beggs, Jeovanni Chauncey Cruel ([id number]) -------------------------------------------------------------------------------- Encounter Discharge Information Details Patient Name: [name] Date of Service: 10/02/2021 8:00 AM Medical Record Number: [id number] Patient Account Number: [id number] Date of Birth/Sex: 1961-03-03 (61 y.o. M) Treating RN: Levora Dredge Primary Care Radley Barto: Drema Dallas Other Clinician: Jacqulyn Bath Referring Joshalyn Ancheta: Drema Dallas Treating Agnieszka Newhouse/Extender: Skipper Cliche in Treatment: 11 Encounter Discharge Information Items Discharge Condition: Stable Ambulatory Status: Ambulatory Discharge Destination: Home Transportation: Private Auto Accompanied By: self Schedule Follow-up Appointment: Yes Clinical Summary of Care: Notes Patient has an HBO treatment scheduled on 10/03/21 at 08:00 am. Electronic Signature(s) Signed: 10/02/2021 10:52:50 AM By: Enedina Finner RCP, RRT, CHT Entered By: Enedina Finner on 10/02/2021 10:51:01 Ocanas, Jasmeet Chauncey Cruel ([id number]) -------------------------------------------------------------------------------- Vitals Details Patient Name: [name] Date of Service: 10/02/2021 8:00 AM Medical Record Number: [id number] Patient Account Number: [id number] Date of Birth/Sex: 1960-06-19 (61 y.o. M) Treating RN: Levora Dredge Primary Care Floella Ensz: Drema Dallas Other Clinician: Jacqulyn Bath Referring Erianna Jolly: Drema Dallas Treating Triniti Gruetzmacher/Extender: Skipper Cliche in Treatment: 11 Vital Signs Time Taken: 08:07 Temperature (F): 98.1 Weight (lbs): 230 Pulse (bpm): 84 Respiratory Rate (breaths/min): 16 Blood Pressure (mmHg): 150/80 Capillary Blood Glucose (mg/dl): 150 Reference Range: 80 - 120 mg / dl Electronic Signature(s) Signed: 10/02/2021 10:52:50 AM By: Enedina Finner RCP, RRT, CHT Entered By: Enedina Finner on 10/02/2021 09:37:53

## 2021-10-02 NOTE — Telephone Encounter (Signed)
Pt notified that we send med  

## 2021-10-03 ENCOUNTER — Encounter: Payer: Managed Care, Other (non HMO) | Admitting: Physician Assistant

## 2021-10-06 ENCOUNTER — Encounter: Payer: Managed Care, Other (non HMO) | Admitting: Physician Assistant

## 2021-10-07 ENCOUNTER — Encounter: Payer: Managed Care, Other (non HMO) | Admitting: Physician Assistant

## 2021-10-07 ENCOUNTER — Ambulatory Visit: Payer: Managed Care, Other (non HMO) | Admitting: Physician Assistant

## 2021-10-08 ENCOUNTER — Encounter: Payer: Managed Care, Other (non HMO) | Admitting: Internal Medicine

## 2021-10-09 ENCOUNTER — Encounter: Payer: Managed Care, Other (non HMO) | Admitting: Physician Assistant

## 2021-10-10 ENCOUNTER — Encounter: Payer: Managed Care, Other (non HMO) | Admitting: Physician Assistant

## 2021-10-11 ENCOUNTER — Other Ambulatory Visit: Payer: Self-pay | Admitting: Physician Assistant

## 2021-10-11 DIAGNOSIS — F5101 Primary insomnia: Secondary | ICD-10-CM

## 2021-10-13 ENCOUNTER — Encounter: Payer: Managed Care, Other (non HMO) | Admitting: Physician Assistant

## 2021-10-14 ENCOUNTER — Ambulatory Visit: Payer: Managed Care, Other (non HMO) | Admitting: Physician Assistant

## 2021-10-14 ENCOUNTER — Encounter: Payer: Managed Care, Other (non HMO) | Admitting: Physician Assistant

## 2021-10-15 ENCOUNTER — Encounter: Payer: Managed Care, Other (non HMO) | Admitting: Internal Medicine

## 2021-10-16 ENCOUNTER — Encounter: Payer: Managed Care, Other (non HMO) | Admitting: Physician Assistant

## 2021-10-17 ENCOUNTER — Encounter: Payer: Managed Care, Other (non HMO) | Admitting: Physician Assistant

## 2021-10-19 ENCOUNTER — Other Ambulatory Visit: Payer: Self-pay

## 2021-10-19 ENCOUNTER — Emergency Department: Payer: Managed Care, Other (non HMO)

## 2021-10-19 ENCOUNTER — Inpatient Hospital Stay
Admission: EM | Admit: 2021-10-19 | Discharge: 2021-10-22 | DRG: 256 | Disposition: A | Discharge status: Home / Self Care | Payer: Managed Care, Other (non HMO) | Attending: Hospitalist | Admitting: Hospitalist

## 2021-10-19 DIAGNOSIS — Z87442 Personal history of urinary calculi: Secondary | ICD-10-CM

## 2021-10-19 DIAGNOSIS — M109 Gout, unspecified: Secondary | ICD-10-CM | POA: Diagnosis present

## 2021-10-19 DIAGNOSIS — E1140 Type 2 diabetes mellitus with diabetic neuropathy, unspecified: Secondary | ICD-10-CM | POA: Diagnosis present

## 2021-10-19 DIAGNOSIS — Z89412 Acquired absence of left great toe: Secondary | ICD-10-CM

## 2021-10-19 DIAGNOSIS — E1152 Type 2 diabetes mellitus with diabetic peripheral angiopathy with gangrene: Principal | ICD-10-CM | POA: Diagnosis present

## 2021-10-19 DIAGNOSIS — M1990 Unspecified osteoarthritis, unspecified site: Secondary | ICD-10-CM | POA: Diagnosis present

## 2021-10-19 DIAGNOSIS — M7989 Other specified soft tissue disorders: Secondary | ICD-10-CM

## 2021-10-19 DIAGNOSIS — K219 Gastro-esophageal reflux disease without esophagitis: Secondary | ICD-10-CM | POA: Diagnosis present

## 2021-10-19 DIAGNOSIS — Z9884 Bariatric surgery status: Secondary | ICD-10-CM

## 2021-10-19 DIAGNOSIS — Z79890 Hormone replacement therapy (postmenopausal): Secondary | ICD-10-CM

## 2021-10-19 DIAGNOSIS — G4733 Obstructive sleep apnea (adult) (pediatric): Secondary | ICD-10-CM | POA: Diagnosis present

## 2021-10-19 DIAGNOSIS — I96 Gangrene, not elsewhere classified: Secondary | ICD-10-CM | POA: Diagnosis not present

## 2021-10-19 DIAGNOSIS — I252 Old myocardial infarction: Secondary | ICD-10-CM

## 2021-10-19 DIAGNOSIS — F909 Attention-deficit hyperactivity disorder, unspecified type: Secondary | ICD-10-CM | POA: Diagnosis present

## 2021-10-19 DIAGNOSIS — Z20822 Contact with and (suspected) exposure to covid-19: Secondary | ICD-10-CM | POA: Diagnosis present

## 2021-10-19 DIAGNOSIS — Z89422 Acquired absence of other left toe(s): Secondary | ICD-10-CM

## 2021-10-19 DIAGNOSIS — E039 Hypothyroidism, unspecified: Secondary | ICD-10-CM | POA: Diagnosis present

## 2021-10-19 DIAGNOSIS — Z833 Family history of diabetes mellitus: Secondary | ICD-10-CM

## 2021-10-19 DIAGNOSIS — E119 Type 2 diabetes mellitus without complications: Secondary | ICD-10-CM

## 2021-10-19 DIAGNOSIS — Z8701 Personal history of pneumonia (recurrent): Secondary | ICD-10-CM

## 2021-10-19 DIAGNOSIS — Z7982 Long term (current) use of aspirin: Secondary | ICD-10-CM

## 2021-10-19 DIAGNOSIS — E782 Mixed hyperlipidemia: Secondary | ICD-10-CM | POA: Diagnosis present

## 2021-10-19 DIAGNOSIS — N400 Enlarged prostate without lower urinary tract symptoms: Secondary | ICD-10-CM | POA: Diagnosis present

## 2021-10-19 DIAGNOSIS — Z8711 Personal history of peptic ulcer disease: Secondary | ICD-10-CM

## 2021-10-19 DIAGNOSIS — G8929 Other chronic pain: Secondary | ICD-10-CM | POA: Diagnosis present

## 2021-10-19 DIAGNOSIS — Z888 Allergy status to other drugs, medicaments and biological substances status: Secondary | ICD-10-CM

## 2021-10-19 DIAGNOSIS — F419 Anxiety disorder, unspecified: Secondary | ICD-10-CM | POA: Diagnosis present

## 2021-10-19 DIAGNOSIS — F329 Major depressive disorder, single episode, unspecified: Secondary | ICD-10-CM | POA: Diagnosis present

## 2021-10-19 DIAGNOSIS — L089 Local infection of the skin and subcutaneous tissue, unspecified: Secondary | ICD-10-CM | POA: Diagnosis present

## 2021-10-19 DIAGNOSIS — I2510 Atherosclerotic heart disease of native coronary artery without angina pectoris: Secondary | ICD-10-CM | POA: Diagnosis present

## 2021-10-19 DIAGNOSIS — E669 Obesity, unspecified: Secondary | ICD-10-CM | POA: Diagnosis present

## 2021-10-19 DIAGNOSIS — E1169 Type 2 diabetes mellitus with other specified complication: Secondary | ICD-10-CM | POA: Diagnosis present

## 2021-10-19 DIAGNOSIS — M869 Osteomyelitis, unspecified: Secondary | ICD-10-CM | POA: Diagnosis present

## 2021-10-19 DIAGNOSIS — Z8616 Personal history of COVID-19: Secondary | ICD-10-CM

## 2021-10-19 DIAGNOSIS — Z86010 Personal history of colonic polyps: Secondary | ICD-10-CM

## 2021-10-19 DIAGNOSIS — I10 Essential (primary) hypertension: Secondary | ICD-10-CM | POA: Diagnosis present

## 2021-10-19 DIAGNOSIS — M549 Dorsalgia, unspecified: Secondary | ICD-10-CM | POA: Diagnosis present

## 2021-10-19 DIAGNOSIS — Z79899 Other long term (current) drug therapy: Secondary | ICD-10-CM

## 2021-10-19 DIAGNOSIS — Z89421 Acquired absence of other right toe(s): Secondary | ICD-10-CM

## 2021-10-19 DIAGNOSIS — Z955 Presence of coronary angioplasty implant and graft: Secondary | ICD-10-CM

## 2021-10-19 DIAGNOSIS — Z6830 Body mass index (BMI) 30.0-30.9, adult: Secondary | ICD-10-CM

## 2021-10-19 DIAGNOSIS — Z79891 Long term (current) use of opiate analgesic: Secondary | ICD-10-CM

## 2021-10-19 DIAGNOSIS — Z9682 Presence of neurostimulator: Secondary | ICD-10-CM

## 2021-10-19 DIAGNOSIS — G4700 Insomnia, unspecified: Secondary | ICD-10-CM | POA: Diagnosis present

## 2021-10-19 DIAGNOSIS — J45909 Unspecified asthma, uncomplicated: Secondary | ICD-10-CM | POA: Diagnosis present

## 2021-10-19 LAB — COMPREHENSIVE METABOLIC PANEL
ALT: 17 U/L (ref 0–44)
AST: 25 U/L (ref 15–41)
Albumin: 3.5 g/dL (ref 3.5–5.0)
Alkaline Phosphatase: 103 U/L (ref 38–126)
Anion gap: 10 (ref 5–15)
BUN: 15 mg/dL (ref 8–23)
CO2: 23 mmol/L (ref 22–32)
Calcium: 8.8 mg/dL — ABNORMAL LOW (ref 8.9–10.3)
Chloride: 107 mmol/L (ref 98–111)
Creatinine, Ser: 0.84 mg/dL (ref 0.61–1.24)
GFR, Estimated: >60 mL/min (ref >60)
Glucose, Bld: 89 mg/dL (ref 70–99)
Potassium: 3.4 mmol/L — ABNORMAL LOW (ref 3.5–5.1)
Sodium: 140 mmol/L (ref 135–145)
Total Bilirubin: 0.5 mg/dL (ref 0.3–1.2)
Total Protein: 7.3 g/dL (ref 6.5–8.1)

## 2021-10-19 LAB — CBC WITH DIFFERENTIAL/PLATELET
Abs Immature Granulocytes: 0.01 10*3/uL (ref 0.00–0.07)
Basophils Absolute: 0 10*3/uL (ref 0.0–0.1)
Basophils Relative: 1 %
Eosinophils Absolute: 0.2 10*3/uL (ref 0.0–0.5)
Eosinophils Relative: 3 %
HCT: 38 % — ABNORMAL LOW (ref 39.0–52.0)
Hemoglobin: 11.8 g/dL — ABNORMAL LOW (ref 13.0–17.0)
Immature Granulocytes: 0 %
Lymphocytes Relative: 32 %
Lymphs Abs: 1.8 10*3/uL (ref 0.7–4.0)
MCH: 27.4 pg (ref 26.0–34.0)
MCHC: 31.1 g/dL (ref 30.0–36.0)
MCV: 88.4 fL (ref 80.0–100.0)
Monocytes Absolute: 0.4 10*3/uL (ref 0.1–1.0)
Monocytes Relative: 7 %
Neutro Abs: 3.2 10*3/uL (ref 1.7–7.7)
Neutrophils Relative %: 57 %
Platelets: 320 10*3/uL (ref 150–400)
RBC: 4.3 MIL/uL (ref 4.22–5.81)
RDW: 14.9 % (ref 11.5–15.5)
WBC: 5.6 10*3/uL (ref 4.0–10.5)
nRBC: 0 % (ref 0.0–0.2)

## 2021-10-19 LAB — LACTIC ACID, PLASMA
Lactic Acid, Venous: 1.9 mmol/L (ref 0.5–1.9)
Lactic Acid, Venous: 0.9 mmol/L (ref 0.5–1.9)

## 2021-10-19 LAB — URINALYSIS, ROUTINE W REFLEX MICROSCOPIC
Bilirubin Urine: NEGATIVE
Glucose, UA: NEGATIVE mg/dL
Hgb urine dipstick: NEGATIVE
Ketones, ur: NEGATIVE mg/dL
Leukocytes,Ua: NEGATIVE
Nitrite: NEGATIVE
Protein, ur: NEGATIVE mg/dL
Specific Gravity, Urine: 1.014 (ref 1.005–1.030)
pH: 5 (ref 5.0–8.0)

## 2021-10-19 LAB — SARS CORONAVIRUS 2 BY RT PCR: SARS Coronavirus 2 by RT PCR: NEGATIVE

## 2021-10-19 LAB — SEDIMENTATION RATE: Sed Rate: 45 mm/hr — ABNORMAL HIGH (ref 0–20)

## 2021-10-19 LAB — C-REACTIVE PROTEIN: CRP: 2.5 mg/dL — ABNORMAL HIGH (ref <1.0)

## 2021-10-19 MED ORDER — SENNOSIDES-DOCUSATE SODIUM 8.6-50 MG PO TABS: 1.0000 | ORAL_TABLET | Freq: Every evening | ORAL | Status: DC | PRN

## 2021-10-19 MED ORDER — ASCORBIC ACID 500 MG PO TABS
500.0000 mg | ORAL_TABLET | Freq: Every day | ORAL | Status: DC
  Administered 2021-10-20 – 2021-10-21 (×2): 500 mg via ORAL
  Filled 2021-10-19 (×2): qty 1

## 2021-10-19 MED ORDER — ONDANSETRON HCL 4 MG PO TABS: 4.0000 mg | ORAL_TABLET | Freq: Four times a day (QID) | ORAL | Status: DC | PRN

## 2021-10-19 MED ORDER — MORPHINE SULFATE (PF) 4 MG/ML IV SOLN
4.0000 mg | Freq: Once | INTRAVENOUS | Status: AC
  Administered 2021-10-19: 4 mg via INTRAVENOUS
  Filled 2021-10-19: qty 1

## 2021-10-19 MED ORDER — MUPIROCIN 2 % EX OINT
1.0000 | TOPICAL_OINTMENT | Freq: Two times a day (BID) | CUTANEOUS | Status: DC
  Administered 2021-10-20 – 2021-10-22 (×5): 1 via NASAL
  Filled 2021-10-19: qty 22

## 2021-10-19 MED ORDER — HEPARIN SODIUM (PORCINE) 5000 UNIT/ML IJ SOLN
5000.0000 [IU] | Freq: Three times a day (TID) | INTRAMUSCULAR | Status: DC
  Administered 2021-10-19: 5000 [IU] via SUBCUTANEOUS
  Filled 2021-10-19 (×2): qty 1

## 2021-10-19 MED ORDER — VANCOMYCIN HCL 2000 MG/400ML IV SOLN
2000.0000 mg | Freq: Once | INTRAVENOUS | Status: AC
  Administered 2021-10-19: 2000 mg via INTRAVENOUS
  Filled 2021-10-19: qty 400

## 2021-10-19 MED ORDER — BUPROPION HCL ER (XL) 150 MG PO TB24
150.0000 mg | ORAL_TABLET | Freq: Every day | ORAL | Status: DC
  Administered 2021-10-20 – 2021-10-22 (×3): 150 mg via ORAL
  Filled 2021-10-19 (×3): qty 1

## 2021-10-19 MED ORDER — ACETAMINOPHEN 325 MG PO TABS
650.0000 mg | ORAL_TABLET | Freq: Four times a day (QID) | ORAL | Status: DC | PRN
Start: 2021-10-19 — End: 2021-10-22

## 2021-10-19 MED ORDER — VANCOMYCIN HCL 1250 MG/250ML IV SOLN
1250.0000 mg | Freq: Two times a day (BID) | INTRAVENOUS | Status: DC
  Administered 2021-10-20 – 2021-10-21 (×3): 1250 mg via INTRAVENOUS
  Filled 2021-10-19 (×3): qty 250

## 2021-10-19 MED ORDER — ALBUTEROL SULFATE (2.5 MG/3ML) 0.083% IN NEBU: 2.5000 mg | INHALATION_SOLUTION | Freq: Four times a day (QID) | RESPIRATORY_TRACT | Status: DC | PRN

## 2021-10-19 MED ORDER — ZINC SULFATE 220 (50 ZN) MG PO CAPS
220.0000 mg | ORAL_CAPSULE | Freq: Every day | ORAL | Status: DC
  Administered 2021-10-19 – 2021-10-21 (×3): 220 mg via ORAL
  Filled 2021-10-19 (×3): qty 1

## 2021-10-19 MED ORDER — SODIUM CHLORIDE 0.9 % IV SOLN
2.0000 g | Freq: Three times a day (TID) | INTRAVENOUS | Status: DC
  Administered 2021-10-19 – 2021-10-21 (×6): 2 g via INTRAVENOUS
  Filled 2021-10-19 (×6): qty 12.5
  Filled 2021-10-19: qty 2
  Filled 2021-10-19: qty 12.5

## 2021-10-19 MED ORDER — ONDANSETRON HCL 4 MG/2ML IJ SOLN: 4.0000 mg | Freq: Four times a day (QID) | INTRAMUSCULAR | Status: DC | PRN

## 2021-10-19 MED ORDER — AMLODIPINE BESYLATE 5 MG PO TABS
5.0000 mg | ORAL_TABLET | Freq: Every day | ORAL | Status: DC
  Administered 2021-10-20 – 2021-10-22 (×3): 5 mg via ORAL
  Filled 2021-10-19 (×3): qty 1

## 2021-10-19 MED ORDER — MORPHINE SULFATE (PF) 2 MG/ML IV SOLN
2.0000 mg | INTRAVENOUS | Status: DC | PRN
  Filled 2021-10-19: qty 1

## 2021-10-19 MED ORDER — ACETAMINOPHEN 650 MG RE SUPP
650.0000 mg | Freq: Four times a day (QID) | RECTAL | Status: DC | PRN
Start: 2021-10-19 — End: 2021-10-22

## 2021-10-19 MED ORDER — POVIDONE-IODINE 10 % EX SWAB
2.0000 | Freq: Once | CUTANEOUS | Status: AC
Start: 2021-10-19 — End: 2021-10-19
  Administered 2021-10-19: 2 via TOPICAL

## 2021-10-19 MED ORDER — ATORVASTATIN CALCIUM 20 MG PO TABS
40.0000 mg | ORAL_TABLET | Freq: Every day | ORAL | Status: DC
  Administered 2021-10-19 – 2021-10-21 (×3): 40 mg via ORAL
  Filled 2021-10-19 (×3): qty 2

## 2021-10-19 MED ORDER — VITAMIN D 25 MCG (1000 UNIT) PO TABS
2000.0000 [IU] | ORAL_TABLET | Freq: Every day | ORAL | Status: DC
  Administered 2021-10-20 – 2021-10-22 (×3): 2000 [IU] via ORAL
  Filled 2021-10-19 (×4): qty 2

## 2021-10-19 MED ORDER — CHLORHEXIDINE GLUCONATE 4 % EX LIQD
60.0000 mL | Freq: Once | CUTANEOUS | Status: AC
Start: 2021-10-19 — End: 2021-10-19
  Administered 2021-10-19: 4 via TOPICAL

## 2021-10-19 MED ORDER — CARVEDILOL 3.125 MG PO TABS
3.1250 mg | ORAL_TABLET | Freq: Two times a day (BID) | ORAL | Status: DC
  Administered 2021-10-19 – 2021-10-22 (×5): 3.125 mg via ORAL
  Filled 2021-10-19 (×5): qty 1

## 2021-10-19 MED ORDER — LEVOTHYROXINE SODIUM 50 MCG PO TABS
75.0000 ug | ORAL_TABLET | Freq: Every day | ORAL | Status: DC
  Administered 2021-10-20 – 2021-10-22 (×3): 75 ug via ORAL
  Filled 2021-10-19 (×3): qty 1

## 2021-10-19 MED ORDER — HYDROMORPHONE HCL 1 MG/ML IJ SOLN
1.0000 mg | INTRAMUSCULAR | Status: DC | PRN
  Administered 2021-10-19 – 2021-10-20 (×3): 1 mg via INTRAVENOUS
  Filled 2021-10-19 (×3): qty 1

## 2021-10-19 MED ORDER — HEPARIN SODIUM (PORCINE) 5000 UNIT/ML IJ SOLN
5000.0000 [IU] | Freq: Three times a day (TID) | INTRAMUSCULAR | Status: AC
  Administered 2021-10-19: 5000 [IU] via SUBCUTANEOUS
  Filled 2021-10-19: qty 1

## 2021-10-19 MED ORDER — ONDANSETRON HCL 4 MG/2ML IJ SOLN
4.0000 mg | Freq: Once | INTRAMUSCULAR | Status: AC
  Administered 2021-10-19: 4 mg via INTRAVENOUS
  Filled 2021-10-19: qty 2

## 2021-10-19 MED ORDER — TAMSULOSIN HCL 0.4 MG PO CAPS
0.4000 mg | ORAL_CAPSULE | Freq: Every day | ORAL | Status: DC
Start: 2021-10-19 — End: 2021-10-19

## 2021-10-19 MED ORDER — ZOLPIDEM TARTRATE 5 MG PO TABS: 10.0000 mg | ORAL_TABLET | Freq: Every evening | ORAL | Status: DC | PRN
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
## 2021-10-19 NOTE — Assessment & Plan Note (Addendum)
-   Bupropion 150 mg p.o. daily resumed

## 2021-10-19 NOTE — Assessment & Plan Note (Addendum)
-   At the third and possibly second left toes - Please see primary problem - Started sed rate and CRP for baseline follow-up

## 2021-10-19 NOTE — ED Triage Notes (Signed)
Pt to ED POV for L middle toe necrotic. LLE is swollen and reddened since 3-4 days ago. Pt has 3 tomes to L foot and DM.  VSS. 1 set cultures sent with labs.
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
## 2021-10-19 NOTE — ED Provider Notes (Signed)
[facility] Provider Note    Event Date/Time   First MD Initiated Contact with Patient 10/19/21 1355     (approximate)   History   necrotic toe   HPI  Charles [name] is a 61 y.o. male who comes in with a necrotic toe on his left foot he has a second third and fourth toes left on that foot and it is the third toe that is necrotic at the tip smells rotting.  The forefoot is red and swollen.  Patient reports the toe has been giving him problems for about 3 to 4 weeks has been taking amoxicillin high doses 3 times a day      Physical Exam   Triage Vital Signs: ED Triage Vitals  Enc Vitals Group     BP 10/19/21 1322 124/85     Pulse Rate 10/19/21 1322 87     Resp 10/19/21 1322 18     Temp 10/19/21 1322 98.6 F (37 C)     Temp Source 10/19/21 1322 Oral     SpO2 10/19/21 1322 96 %     Weight 10/19/21 1323 230 lb (104.3 kg)     Height 10/19/21 1323 '6\' 1"'$  (1.854 m)     Head Circumference --      Peak Flow --      Pain Score 10/19/21 1322 10     Pain Loc --      Pain Edu? --      Excl. in [place]? --     Most recent vital signs: Vitals:   10/19/21 1322  BP: 124/85  Pulse: 87  Resp: 18  Temp: 98.6 F (37 C)  SpO2: 96%     General: Awake, no distress.  CV:  Heart regular rate and rhythm no audible murmur Resp:  Normal effort.  Lungs are clear Abd:  No distention.  Soft and nontender Patient has a dressing on the right forefoot which was amputated sometime ago the left great toe amputation site looks good as does the left fifth toe amputation site but the third toe distal phalanx is brownish and oozy and smells bad.   ED Results / Procedures / Treatments   Labs (all labs ordered are listed, but only abnormal results are displayed) Labs Reviewed  COMPREHENSIVE METABOLIC PANEL - Abnormal; Notable for the following components:      Result Value   Potassium 3.4 (*)    Calcium 8.8 (*)    All other components within normal limits  CBC WITH  DIFFERENTIAL/PLATELET - Abnormal; Notable for the following components:   Hemoglobin 11.8 (*)    HCT 38.0 (*)    All other components within normal limits  LACTIC ACID, PLASMA  LACTIC ACID, PLASMA  URINALYSIS, ROUTINE W REFLEX MICROSCOPIC  SEDIMENTATION RATE     EKG     RADIOLOGY X-ray pending at this time   PROCEDURES:  Critical Care performed:   Procedures   MEDICATIONS ORDERED IN ED: Medications  morphine (PF) 4 MG/ML injection 4 mg (4 mg Intravenous Given 10/19/21 1413)  ondansetron (ZOFRAN) injection 4 mg (4 mg Intravenous Given 10/19/21 1413)     IMPRESSION / MDM / ASSESSMENT AND PLAN / ED COURSE  I reviewed the triage vital signs and the nursing notes. Patient with necrotic toe failing outpatient management we will have to get him in the hospital IV antibiotics and possible amputation    Patient's presentation is most consistent with acute presentation with potential threat to life or  bodily function.  The patient is on the cardiac monitor to evaluate for evidence of arrhythmia and/or significant heart rate changes, none have been seen.      FINAL CLINICAL IMPRESSION(S) / ED DIAGNOSES   Final diagnoses:  Necrotic toes ([place])     Rx / DC Orders   ED Discharge Orders     None        Note:  This document was prepared using Dragon voice recognition software and may include unintentional dictation errors.   Nena Polio, MD 10/19/21 [phone]

## 2021-10-19 NOTE — Assessment & Plan Note (Signed)
-   ABI ordered - Vancomycin and cefepime IV per pharmacy - EDP consulted Dr. Vickki Muff, podiatry who recommends admission and IV antibiotics and states he will see the patient today - N.p.o. after midnight except for sips with meds - Admit to [place], observation

## 2021-10-19 NOTE — Consult Note (Signed)
Pharmacy Antibiotic Note  Charles [name] is a 61 y.o. male admitted on 10/19/2021 with  Osteomyelitis .  Pharmacy has been consulted for Cefepime and Vancomycin dosing.  Plan: Cefepime 2 gm IVPB q 8 hrs Vancomycin '2000mg'$  IVPB x1 dose (loading), then Vancomycin 1250 mg IV Q 12 hrs. Goal AUC 400-550. Expected AUC: 526 SCr used: 0.84 Vd coefficient 0.5 (BMI 30.4)   Height: '6\' 1"'$  (185.4 cm) Weight: 104.3 kg (230 lb) IBW/kg (Calculated) : 79.9  Temp (24hrs), Avg:98.6 F (37 C), Min:98.6 F (37 C), Max:98.6 F (37 C)  Recent Labs  Lab 10/19/21 1326  WBC 5.6  CREATININE 0.84  LATICACIDVEN 1.9    Estimated Creatinine Clearance: 117.2 mL/min (by C-G formula based on SCr of 0.84 mg/dL).    Allergies  Allergen Reactions   Lisinopril Cough    Antimicrobials this admission: 7/2 Cefepime >>  7/2 Vancomycin >>   Thank you for allowing pharmacy to be a part of this patient's care.  Davien Malone Rodriguez-Guzman PharmD, BCPS 10/19/2021 3:00 PM

## 2021-10-19 NOTE — Assessment & Plan Note (Signed)
-   Atorvastatin 40 mg nightly ordered
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
## 2021-10-19 NOTE — Assessment & Plan Note (Addendum)
-   Patient is no longer taking tamsulosin 0.4 mg nightly

## 2021-10-19 NOTE — Assessment & Plan Note (Signed)
-   CPAP nightly ordered 

## 2021-10-19 NOTE — Assessment & Plan Note (Signed)
-   Levothyroxine 75 mcg daily resumed 

## 2021-10-20 ENCOUNTER — Inpatient Hospital Stay: Payer: Managed Care, Other (non HMO)

## 2021-10-20 ENCOUNTER — Encounter: Payer: Managed Care, Other (non HMO) | Admitting: Physician Assistant

## 2021-10-20 ENCOUNTER — Ambulatory Visit: Payer: Managed Care, Other (non HMO) | Admitting: Physician Assistant

## 2021-10-20 ENCOUNTER — Encounter
Admission: EM | Disposition: A | Discharge status: Home / Self Care | Payer: Self-pay | Source: Home / Self Care | Attending: Internal Medicine

## 2021-10-20 ENCOUNTER — Inpatient Hospital Stay: Payer: Managed Care, Other (non HMO) | Admitting: Anesthesiology

## 2021-10-20 ENCOUNTER — Encounter: Payer: Self-pay | Admitting: Internal Medicine

## 2021-10-20 ENCOUNTER — Other Ambulatory Visit: Payer: Self-pay

## 2021-10-20 DIAGNOSIS — E782 Mixed hyperlipidemia: Secondary | ICD-10-CM

## 2021-10-20 DIAGNOSIS — I10 Essential (primary) hypertension: Secondary | ICD-10-CM | POA: Diagnosis present

## 2021-10-20 DIAGNOSIS — M86172 Other acute osteomyelitis, left ankle and foot: Secondary | ICD-10-CM

## 2021-10-20 DIAGNOSIS — E1140 Type 2 diabetes mellitus with diabetic neuropathy, unspecified: Secondary | ICD-10-CM | POA: Diagnosis present

## 2021-10-20 DIAGNOSIS — G4733 Obstructive sleep apnea (adult) (pediatric): Secondary | ICD-10-CM | POA: Diagnosis present

## 2021-10-20 DIAGNOSIS — I252 Old myocardial infarction: Secondary | ICD-10-CM | POA: Diagnosis not present

## 2021-10-20 DIAGNOSIS — J45909 Unspecified asthma, uncomplicated: Secondary | ICD-10-CM | POA: Diagnosis present

## 2021-10-20 DIAGNOSIS — N400 Enlarged prostate without lower urinary tract symptoms: Secondary | ICD-10-CM | POA: Diagnosis present

## 2021-10-20 DIAGNOSIS — F419 Anxiety disorder, unspecified: Secondary | ICD-10-CM | POA: Diagnosis present

## 2021-10-20 DIAGNOSIS — F329 Major depressive disorder, single episode, unspecified: Secondary | ICD-10-CM | POA: Diagnosis present

## 2021-10-20 DIAGNOSIS — G4700 Insomnia, unspecified: Secondary | ICD-10-CM | POA: Diagnosis present

## 2021-10-20 DIAGNOSIS — Z87442 Personal history of urinary calculi: Secondary | ICD-10-CM | POA: Diagnosis not present

## 2021-10-20 DIAGNOSIS — Z86010 Personal history of colonic polyps: Secondary | ICD-10-CM | POA: Diagnosis not present

## 2021-10-20 DIAGNOSIS — I2510 Atherosclerotic heart disease of native coronary artery without angina pectoris: Secondary | ICD-10-CM | POA: Diagnosis present

## 2021-10-20 DIAGNOSIS — E1169 Type 2 diabetes mellitus with other specified complication: Secondary | ICD-10-CM | POA: Diagnosis present

## 2021-10-20 DIAGNOSIS — E669 Obesity, unspecified: Secondary | ICD-10-CM | POA: Diagnosis present

## 2021-10-20 DIAGNOSIS — Z833 Family history of diabetes mellitus: Secondary | ICD-10-CM | POA: Diagnosis not present

## 2021-10-20 DIAGNOSIS — Z6830 Body mass index (BMI) 30.0-30.9, adult: Secondary | ICD-10-CM | POA: Diagnosis not present

## 2021-10-20 DIAGNOSIS — E1152 Type 2 diabetes mellitus with diabetic peripheral angiopathy with gangrene: Secondary | ICD-10-CM | POA: Diagnosis present

## 2021-10-20 DIAGNOSIS — Z20822 Contact with and (suspected) exposure to covid-19: Secondary | ICD-10-CM | POA: Diagnosis present

## 2021-10-20 DIAGNOSIS — Z8616 Personal history of COVID-19: Secondary | ICD-10-CM | POA: Diagnosis not present

## 2021-10-20 DIAGNOSIS — L089 Local infection of the skin and subcutaneous tissue, unspecified: Secondary | ICD-10-CM | POA: Diagnosis present

## 2021-10-20 DIAGNOSIS — K219 Gastro-esophageal reflux disease without esophagitis: Secondary | ICD-10-CM | POA: Diagnosis present

## 2021-10-20 DIAGNOSIS — E039 Hypothyroidism, unspecified: Secondary | ICD-10-CM | POA: Diagnosis present

## 2021-10-20 DIAGNOSIS — I96 Gangrene, not elsewhere classified: Secondary | ICD-10-CM | POA: Diagnosis present

## 2021-10-20 DIAGNOSIS — Z8711 Personal history of peptic ulcer disease: Secondary | ICD-10-CM | POA: Diagnosis not present

## 2021-10-20 DIAGNOSIS — M869 Osteomyelitis, unspecified: Secondary | ICD-10-CM | POA: Diagnosis present

## 2021-10-20 HISTORY — PX: AMPUTATION TOE: SHX6595

## 2021-10-20 LAB — CBC
HCT: 35.2 % — ABNORMAL LOW (ref 39.0–52.0)
Hemoglobin: 11.2 g/dL — ABNORMAL LOW (ref 13.0–17.0)
MCH: 28.1 pg (ref 26.0–34.0)
MCHC: 31.8 g/dL (ref 30.0–36.0)
MCV: 88.4 fL (ref 80.0–100.0)
Platelets: 282 10*3/uL (ref 150–400)
RBC: 3.98 MIL/uL — ABNORMAL LOW (ref 4.22–5.81)
RDW: 14.9 % (ref 11.5–15.5)
WBC: 4.1 10*3/uL (ref 4.0–10.5)
nRBC: 0 % (ref 0.0–0.2)

## 2021-10-20 LAB — BASIC METABOLIC PANEL
Anion gap: 7 (ref 5–15)
BUN: 15 mg/dL (ref 8–23)
CO2: 26 mmol/L (ref 22–32)
Calcium: 8.7 mg/dL — ABNORMAL LOW (ref 8.9–10.3)
Chloride: 109 mmol/L (ref 98–111)
Creatinine, Ser: 0.88 mg/dL (ref 0.61–1.24)
GFR, Estimated: >60 mL/min (ref >60)
Glucose, Bld: 96 mg/dL (ref 70–99)
Potassium: 4.7 mmol/L (ref 3.5–5.1)
Sodium: 142 mmol/L (ref 135–145)

## 2021-10-20 LAB — GLUCOSE, CAPILLARY
Glucose-Capillary: 96 mg/dL (ref 70–99)
Glucose-Capillary: 86 mg/dL (ref 70–99)
Glucose-Capillary: 99 mg/dL (ref 70–99)
Glucose-Capillary: 108 mg/dL — ABNORMAL HIGH (ref 70–99)

## 2021-10-20 LAB — SURGICAL PCR SCREEN
MRSA, PCR: NEGATIVE
Staphylococcus aureus: POSITIVE — AB

## 2021-10-20 SURGERY — AMPUTATION, TOE
Anesthesia: General | Site: Toe | Laterality: Left

## 2021-10-20 MED ORDER — PROPOFOL 10 MG/ML IV BOLUS
INTRAVENOUS | Status: DC | PRN
  Administered 2021-10-20: 30 mg via INTRAVENOUS
  Administered 2021-10-20: 40 mg via INTRAVENOUS

## 2021-10-20 MED ORDER — SODIUM CHLORIDE FLUSH 0.9 % IV SOLN
INTRAVENOUS | Status: AC
  Filled 2021-10-20: qty 10

## 2021-10-20 MED ORDER — INSULIN ASPART 100 UNIT/ML IJ SOLN: 0.0000 [IU] | Freq: Three times a day (TID) | INTRAMUSCULAR | Status: DC

## 2021-10-20 MED ORDER — KETAMINE HCL 50 MG/5ML IJ SOSY
PREFILLED_SYRINGE | INTRAMUSCULAR | Status: AC
  Filled 2021-10-20: qty 5

## 2021-10-20 MED ORDER — FENTANYL CITRATE (PF) 100 MCG/2ML IJ SOLN: 25.0000 ug | INTRAMUSCULAR | Status: DC | PRN

## 2021-10-20 MED ORDER — MIDAZOLAM HCL 2 MG/2ML IJ SOLN
INTRAMUSCULAR | Status: DC | PRN
  Administered 2021-10-20: 2 mg via INTRAVENOUS

## 2021-10-20 MED ORDER — FENTANYL CITRATE (PF) 100 MCG/2ML IJ SOLN
INTRAMUSCULAR | Status: DC | PRN
  Administered 2021-10-20 (×2): 25 ug via INTRAVENOUS

## 2021-10-20 MED ORDER — PROPOFOL 500 MG/50ML IV EMUL
INTRAVENOUS | Status: DC | PRN
  Administered 2021-10-20: 100 ug/kg/min via INTRAVENOUS

## 2021-10-20 MED ORDER — PROPOFOL 1000 MG/100ML IV EMUL
INTRAVENOUS | Status: AC
  Filled 2021-10-20: qty 100

## 2021-10-20 MED ORDER — BUPIVACAINE HCL (PF) 0.5 % IJ SOLN
INTRAMUSCULAR | Status: AC
Start: 2021-10-20 — End: ?
  Filled 2021-10-20: qty 30

## 2021-10-20 MED ORDER — LIDOCAINE HCL 1 % IJ SOLN
INTRAMUSCULAR | Status: DC | PRN
  Administered 2021-10-20: 10 mL via SURGICAL_CAVITY

## 2021-10-20 MED ORDER — LIDOCAINE HCL (PF) 1 % IJ SOLN
INTRAMUSCULAR | Status: AC
  Filled 2021-10-20: qty 30

## 2021-10-20 MED ORDER — MIDAZOLAM HCL 2 MG/2ML IJ SOLN
INTRAMUSCULAR | Status: AC
  Filled 2021-10-20: qty 2

## 2021-10-20 MED ORDER — OXYCODONE HCL 5 MG PO TABS: 5.0000 mg | ORAL_TABLET | Freq: Once | ORAL | Status: DC | PRN

## 2021-10-20 MED ORDER — FENTANYL CITRATE (PF) 100 MCG/2ML IJ SOLN
INTRAMUSCULAR | Status: AC
  Filled 2021-10-20: qty 2

## 2021-10-20 MED ORDER — OXYCODONE HCL 5 MG/5ML PO SOLN: 5.0000 mg | Freq: Once | ORAL | Status: DC | PRN

## 2021-10-20 MED ORDER — LACTATED RINGERS IV SOLN
INTRAVENOUS | Status: DC
Start: 2021-10-20 — End: 2021-10-20

## 2021-10-20 MED ORDER — INSULIN ASPART 100 UNIT/ML IJ SOLN: 0.0000 [IU] | Freq: Every day | INTRAMUSCULAR | Status: DC

## 2021-10-20 MED ORDER — KETAMINE HCL 10 MG/ML IJ SOLN
INTRAMUSCULAR | Status: DC | PRN
  Administered 2021-10-20: 30 mg via INTRAVENOUS
  Administered 2021-10-20: 20 mg via INTRAVENOUS

## 2021-10-20 MED ORDER — MICROFIBRILLAR COLL HEMOSTAT EX PADS
MEDICATED_PAD | CUTANEOUS | Status: DC | PRN
  Administered 2021-10-20: 4 via TOPICAL

## 2021-10-20 MED ORDER — ONDANSETRON HCL 4 MG/2ML IJ SOLN: 4.0000 mg | Freq: Once | INTRAMUSCULAR | Status: DC | PRN

## 2021-10-20 MED ORDER — SODIUM CHLORIDE 0.9 % IV SOLN: INTRAVENOUS | Status: DC | PRN

## 2021-10-20 MED ORDER — HYDROMORPHONE HCL 1 MG/ML IJ SOLN
1.0000 mg | INTRAMUSCULAR | Status: DC | PRN
  Administered 2021-10-20 – 2021-10-21 (×4): 1 mg via INTRAVENOUS
  Filled 2021-10-20 (×4): qty 1

## 2021-10-20 MED ORDER — 0.9 % SODIUM CHLORIDE (POUR BTL) OPTIME
TOPICAL | Status: DC | PRN
  Administered 2021-10-20: 1000 mL

## 2021-10-20 MED ORDER — ACETAMINOPHEN 10 MG/ML IV SOLN: 1000.0000 mg | Freq: Once | INTRAVENOUS | Status: DC | PRN

## 2021-10-20 SURGICAL SUPPLY — 48 items
BLADE OSC/SAGITTAL MD 5.5X18 (BLADE) ×2 IMPLANT
BLADE SURG MINI STRL (BLADE) ×2 IMPLANT
BNDG COHESIVE 4X5 TAN ST LF (GAUZE/BANDAGES/DRESSINGS) ×1 IMPLANT
BNDG ELASTIC 4X5.8 VLCR NS LF (GAUZE/BANDAGES/DRESSINGS) ×2 IMPLANT
BNDG ESMARK 4X12 TAN STRL LF (GAUZE/BANDAGES/DRESSINGS) ×2 IMPLANT
BNDG GAUZE DERMACEA FLUFF (GAUZE/BANDAGES/DRESSINGS) ×1
BNDG GAUZE DERMACEA FLUFF 4 (GAUZE/BANDAGES/DRESSINGS) ×1 IMPLANT
BNDG STRETCH GAUZE 3IN X12FT (GAUZE/BANDAGES/DRESSINGS) ×4 IMPLANT
CUFF TOURN SGL QUICK 12 (TOURNIQUET CUFF) IMPLANT
CUFF TOURN SGL QUICK 18X4 (TOURNIQUET CUFF) IMPLANT
DRAPE FLUOR MINI C-ARM 54X84 (DRAPES) ×2 IMPLANT
DRAPE XRAY CASSETTE 23X24 (DRAPES) ×2 IMPLANT
DURAPREP 26ML APPLICATOR (WOUND CARE) ×2 IMPLANT
ELECT REM PT RETURN 9FT ADLT (ELECTROSURGICAL) ×2
ELECTRODE REM PT RTRN 9FT ADLT (ELECTROSURGICAL) ×1 IMPLANT
GAUZE PACKING IODOFORM 1/2 (PACKING) ×2 IMPLANT
GAUZE SPONGE 4X4 12PLY STRL (GAUZE/BANDAGES/DRESSINGS) ×2 IMPLANT
GAUZE STRETCH 2X75IN STRL (MISCELLANEOUS) ×2 IMPLANT
GAUZE XEROFORM 1X8 LF (GAUZE/BANDAGES/DRESSINGS) ×2 IMPLANT
GLOVE BIO SURGEON STRL SZ7.5 (GLOVE) ×2 IMPLANT
GLOVE SURG UNDER LTX SZ8 (GLOVE) ×2 IMPLANT
GOWN STRL REUS W/ TWL XL LVL3 (GOWN DISPOSABLE) ×2 IMPLANT
GOWN STRL REUS W/TWL XL LVL3 (GOWN DISPOSABLE) ×4
HEMOSTAT SURGICEL 2X3 (HEMOSTASIS) ×4 IMPLANT
IV NS IRRIG 3000ML ARTHROMATIC (IV SOLUTION) ×2 IMPLANT
KIT TURNOVER KIT A (KITS) ×2 IMPLANT
LABEL OR SOLS (LABEL) ×2 IMPLANT
MANIFOLD NEPTUNE II (INSTRUMENTS) ×2 IMPLANT
NDL FILTER BLUNT 18X1 1/2 (NEEDLE) ×1 IMPLANT
NDL HYPO 25X1 1.5 SAFETY (NEEDLE) ×1 IMPLANT
NEEDLE FILTER BLUNT 18X 1/2SAF (NEEDLE) ×1
NEEDLE FILTER BLUNT 18X1 1/2 (NEEDLE) ×1 IMPLANT
NEEDLE HYPO 25X1 1.5 SAFETY (NEEDLE) ×2 IMPLANT
NS IRRIG 500ML POUR BTL (IV SOLUTION) ×2 IMPLANT
PACK EXTREMITY ARMC (MISCELLANEOUS) ×2 IMPLANT
PAD ABD DERMACEA PRESS 5X9 (GAUZE/BANDAGES/DRESSINGS) ×4 IMPLANT
PULSAVAC PLUS IRRIG FAN TIP (DISPOSABLE) ×2
SHIELD FULL FACE ANTIFOG 7M (MISCELLANEOUS) ×2 IMPLANT
STOCKINETTE M/LG 89821 (MISCELLANEOUS) ×2 IMPLANT
STRAP SAFETY 5IN WIDE (MISCELLANEOUS) ×2 IMPLANT
SUT ETHILON 3-0 FS-10 30 BLK (SUTURE) ×6
SUT ETHILON 5-0 FS-2 18 BLK (SUTURE) ×2 IMPLANT
SUT VIC AB 4-0 FS2 27 (SUTURE) ×2 IMPLANT
SUTURE EHLN 3-0 FS-10 30 BLK (SUTURE) ×1 IMPLANT
SYR 10ML LL (SYRINGE) ×6 IMPLANT
TIP FAN IRRIG PULSAVAC PLUS (DISPOSABLE) ×1 IMPLANT
WATER STERILE IRR 500ML POUR (IV SOLUTION) ×2 IMPLANT
post op shoe ×1 IMPLANT

## 2021-10-20 NOTE — Anesthesia Preprocedure Evaluation (Addendum)
Anesthesia Evaluation  Patient identified by MRN, date of birth, ID band Patient awake    Reviewed: Allergy & Precautions, H&P , NPO status , Patient's Chart, lab work & pertinent test results, reviewed documented beta blocker date and time   History of Anesthesia Complications Negative for: history of anesthetic complications  Airway Mallampati: III  TM Distance: >3 FB Neck ROM: full    Dental no notable dental hx.    Pulmonary neg shortness of breath, asthma , sleep apnea and Continuous Positive Airway Pressure Ventilation , neg COPD, neg recent URI,    Pulmonary exam normal breath sounds clear to auscultation       Cardiovascular Exercise Tolerance: Good hypertension, (-) angina+ CAD (s/p MI and stents), + Past MI and + Cardiac Stents  Normal cardiovascular exam(-) dysrhythmias (-) Valvular Problems/Murmurs Rhythm:regular Rate:Normal  ECG 02/11/21: Sinus bradycardia (HR 59)  Echo 11/12/20:  NORMAL LEFT VENTRICULAR SYSTOLIC FUNCTION  NORMAL RIGHT VENTRICULAR SYSTOLIC FUNCTION  NO VALVULAR STENOSIS  TRIVIAL MR, TR  EF 50-55%    Neuro/Psych neg Seizures PSYCHIATRIC DISORDERS (ADHD) Anxiety Depression Chronic back pain s/p intrathecal pain pump  Neuromuscular disease (diabetic polyneuropathy) negative psych ROS   GI/Hepatic Neg liver ROS, PUD, GERD  ,S/p gastric bypass 2011   Endo/Other  diabetes, Type 2Hypothyroidism Obesity   Renal/GU Renal disease (nephrolithiasis)  negative genitourinary   Musculoskeletal  (+) Arthritis , Gout    Abdominal   Peds  Hematology  (+) Blood dyscrasia, anemia ,   Anesthesia Other Findings Cardiology note 11/19/20:  1. Coronary artery disease s/p LAD stent in 2017: Patient has been adequately medically managed for his coronary artery disease post stent with atorvastatin for cholesterol control. Last LDL on 04/15/2020 was 61. Patient is asymptomatic and stable at this time. No medication  changes at this time -Normal treadmill stress test with no evidence of ischemia or arrhythmia -No medication changes  2. Rule out congestive heart failure for candidacy of ketamine infusion: Patient's echocardiogram showed no evidence of structural or functional abnormalities indicative of congestive heart failure. Patient had a normal ejection fraction of 50-55% with normal LV and RV systolic function. -Patient provided with copy of test results  3. Hypertension: Patient's blood pressure is under adequate control at this visit at 140/80. He is stable on amlodipine and carvedilol and reports no medication side effects at this time. No medication changes at this visit.  4. Hyperlipidemia: Patient's cholesterol is well managed on atorvastatin with an LDL on 04/15/2020 of 61. Patient reports no medication side effects with this. No medication changes at this time.  No orders of the defined types were placed in this encounter.  Return in about 9 months (around 08/19/2021).   Reproductive/Obstetrics negative OB ROS                            Anesthesia Physical Anesthesia Plan  ASA: 3  Anesthesia Plan: General   Post-op Pain Management:    Induction: Intravenous  PONV Risk Score and Plan: Propofol infusion and TIVA  Airway Management Planned: Natural Airway and Nasal Cannula  Additional Equipment:   Intra-op Plan:   Post-operative Plan:   Informed Consent: I have reviewed the patients History and Physical, chart, labs and discussed the procedure including the risks, benefits and alternatives for the proposed anesthesia with the patient or authorized representative who has indicated his/her understanding and acceptance.     Dental Advisory Given  Plan Discussed with: Anesthesiologist,  CRNA and Surgeon  Anesthesia Plan Comments:        Anesthesia Quick Evaluation

## 2021-10-20 NOTE — Plan of Care (Signed)
  Problem: Education: Goal: Knowledge of General Education information will improve Description: Including pain rating scale, medication(s)/side effects and non-pharmacologic comfort measures Outcome: Progressing   Problem: Health Behavior/Discharge Planning: Goal: Ability to manage health-related needs will improve Outcome: Progressing   Problem: Clinical Measurements: Goal: Ability to maintain clinical measurements within normal limits will improve Outcome: Progressing Goal: Will remain free from infection Outcome: Progressing Goal: Diagnostic test results will improve Outcome: Progressing Goal: Respiratory complications will improve Outcome: Progressing Goal: Cardiovascular complication will be avoided Outcome: Progressing   Problem: Activity: Goal: Risk for activity intolerance will decrease Outcome: Progressing   Problem: Nutrition: Goal: Adequate nutrition will be maintained Outcome: Progressing   Problem: Elimination: Goal: Will not experience complications related to bowel motility Outcome: Progressing Goal: Will not experience complications related to urinary retention Outcome: Progressing   Problem: Pain Managment: Goal: General experience of comfort will improve Outcome: Progressing   Problem: Safety: Goal: Ability to remain free from injury will improve Outcome: Progressing   Problem: Education: Goal: Ability to describe self-care measures that may prevent or decrease complications (Diabetes Survival Skills Education) will improve Outcome: Progressing Goal: Individualized Educational Video(s) Outcome: Progressing   Problem: Coping: Goal: Ability to adjust to condition or change in health will improve Outcome: Progressing

## 2021-10-20 NOTE — Transfer of Care (Signed)
Immediate Anesthesia Transfer of Care Note  Patient: Charles [name]  Procedure(s) Performed: AMPUTATION TOE-2,3,4th Toes (Left: Toe)  Patient Location: PACU  Anesthesia Type:General  Level of Consciousness: awake, alert  and oriented  Airway & Oxygen Therapy: Patient Spontanous Breathing and Patient connected to nasal cannula oxygen  Post-op Assessment: Report given to RN and Post -op Vital signs reviewed and stable  Post vital signs: Reviewed and stable  Last Vitals:  Vitals Value Taken Time  BP 134/79   Temp    Pulse 58   Resp 15   SpO2 93     Last Pain:  Vitals:   10/20/21 1650  TempSrc: Oral  PainSc: 5       Patients Stated Pain Goal: 2 (15/72/62 0355)  Complications: No notable events documented.
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
## 2021-10-20 NOTE — Op Note (Signed)
Operative note   Surgeon:Tagan Bartram    Assistant:None    Preop diagnosis: Gangrene left third toe.  Status post previous left great toe and fifth toe amputations    Postop diagnosis: Same    Procedure: 1.  Amputation left second toe 2.  Amputation left third toe 3.  Amputation left fourth toe    EBL: 20 mL    Anesthesia:local with IV sedation    Hemostasis: Ankle tourniquet inflated to 200 mmHg for 8 minutes    Specimen: Gangrenous left third toe    Complications: None    Operative indications:Charles [name] is an 61 y.o. that presents today for surgical intervention.  The risks/benefits/alternatives/complications have been discussed and consent has been given.    Procedure:  Patient was brought into the OR and placed on the operating table in thesupine position. After anesthesia was obtained theleft lower extremity was prepped and draped in usual sterile fashion.  Attention was directed to the distal aspect of the left foot where the remaining second third and fourth toes were noted.  The left third toe was gangrenous.  Preoperatively we had discussed amputation of the second third and fourth toes to have a even distal forefoot and prevent future complications with the remaining digits.  At this time 2 semielliptical incisions were made around the fourth toe.  Full-thickness flaps were created.  The toe was disarticulated at the MTPJ.  Bleeding was Bovie cauterized.  Skin flaps were created around the third toe.  Full-thickness dissection carried down to the metatarsophalangeal joint.  The toe was disarticulated at the metatarsophalangeal joint.  Finally skin flaps were created around the second toe.  Full-thickness flaps were created down to the level of the metatarsophalangeal joint.  The toe was then disarticulated at the metatarsophalangeal joint.  All wounds were flushed with copious amounts of irrigation.  Bovie cauterized was performed to all areas.  All bleeding was stopped.   At this time closure of the skin incision was performed with a 3-0 nylon with simple interrupted sutures.  A bulky sterile dressing was applied.  The skin flaps were noted to be well perfused.     Patient tolerated the procedure and anesthesia well.  Was transported from the OR to the PACU with all vital signs stable and vascular status intact. To be discharged per routine protocol.  Will follow up in approximately 1 week in the outpatient clinic.

## 2021-10-21 ENCOUNTER — Ambulatory Visit: Payer: Managed Care, Other (non HMO) | Admitting: Infectious Diseases

## 2021-10-21 ENCOUNTER — Encounter: Payer: Self-pay | Admitting: Podiatry

## 2021-10-21 DIAGNOSIS — I96 Gangrene, not elsewhere classified: Secondary | ICD-10-CM | POA: Diagnosis not present

## 2021-10-21 DIAGNOSIS — M86172 Other acute osteomyelitis, left ankle and foot: Secondary | ICD-10-CM | POA: Diagnosis not present

## 2021-10-21 DIAGNOSIS — E782 Mixed hyperlipidemia: Secondary | ICD-10-CM | POA: Diagnosis not present

## 2021-10-21 DIAGNOSIS — N400 Enlarged prostate without lower urinary tract symptoms: Secondary | ICD-10-CM | POA: Diagnosis not present

## 2021-10-21 LAB — GLUCOSE, CAPILLARY
Glucose-Capillary: 104 mg/dL — ABNORMAL HIGH (ref 70–99)
Glucose-Capillary: 120 mg/dL — ABNORMAL HIGH (ref 70–99)
Glucose-Capillary: 160 mg/dL — ABNORMAL HIGH (ref 70–99)
Glucose-Capillary: 116 mg/dL — ABNORMAL HIGH (ref 70–99)

## 2021-10-21 MED ORDER — OXYCODONE-ACETAMINOPHEN 5-325 MG PO TABS
1.0000 | ORAL_TABLET | Freq: Four times a day (QID) | ORAL | Status: DC | PRN
  Administered 2021-10-21 – 2021-10-22 (×3): 2 via ORAL
  Filled 2021-10-21 (×3): qty 2

## 2021-10-21 MED ORDER — DOXYCYCLINE HYCLATE 100 MG PO TABS
100.0000 mg | ORAL_TABLET | Freq: Two times a day (BID) | ORAL | Status: DC
  Administered 2021-10-21 – 2021-10-22 (×3): 100 mg via ORAL
  Filled 2021-10-21 (×3): qty 1

## 2021-10-21 MED ORDER — OXYCODONE-ACETAMINOPHEN 5-325 MG PO TABS: 1.0000 | ORAL_TABLET | Freq: Four times a day (QID) | ORAL | Status: DC | PRN

## 2021-10-21 NOTE — Plan of Care (Signed)

## 2021-10-21 NOTE — Progress Notes (Signed)
Daily Progress Note   Subjective  - 1 Day Post-Op  F/u second third and fourth toe amputations left foot.  Doing well.  Objective Vitals:   10/20/21 2149 10/21/21 0400 10/21/21 0740 10/21/21 1144  BP: (!) 142/71 (!) 154/86 (!) 146/90 (!) 149/89  Pulse: 92 64 75 79  Resp: '18 18 17 15  '$ Temp: 98.5 F (36.9 C) 98.2 F (36.8 C) 98.3 F (36.8 C) 98.1 F (36.7 C)  TempSrc: Oral Oral    SpO2: 97% 98% 100% 98%  Weight:      Height:        Physical Exam: Incisions well coapted.  No dehiscence.  No active drainage.   Laboratory CBC    Component Value Date/Time   WBC 4.1 10/20/2021 0351   HGB 11.2 (L) 10/20/2021 0351   HGB 13.1 04/15/2020 1308   HCT 35.2 (L) 10/20/2021 0351   HCT 38.7 04/15/2020 1308   PLT 282 10/20/2021 0351   PLT 216 04/15/2020 1308    BMET    Component Value Date/Time   NA 142 10/20/2021 0351   NA 138 04/15/2020 1308   NA 140 01/27/2013 2020   K 4.7 10/20/2021 0351   K 3.7 01/27/2013 2020   CL 109 10/20/2021 0351   CL 107 01/27/2013 2020   CO2 26 10/20/2021 0351   CO2 26 01/27/2013 2020   GLUCOSE 96 10/20/2021 0351   GLUCOSE 85 01/27/2013 2020   BUN 15 10/20/2021 0351   BUN 18 04/15/2020 1308   BUN 16 01/27/2013 2020   CREATININE 0.88 10/20/2021 0351   CREATININE 0.98 01/27/2013 2020   CALCIUM 8.7 (L) 10/20/2021 0351   CALCIUM 8.6 01/27/2013 2020   GFRNONAA >60 10/20/2021 0351   GFRNONAA >60 01/27/2013 2020   GFRAA 97 04/15/2020 1308   GFRAA >60 01/27/2013 2020    Assessment/Planning: Status post left second third and fourth toe amputations for gangrenous changes  Dressing changed today.  I discussed with the patient dressing changes can be performed 2-3 times a week with a dry dressing as needed.  He feels comfortable to perform this and has performed this in the past. I would recommend oral antibiotics for the next 10 days.  P.o. doxycycline.  We can follow culture results outpatient. Ordered an OrthoWedge shoe for him to place  weightbearing on his heel.  Physical therapy orders have been written as well. Once medically stable and has been evaluated by therapy suspect patient is stable for discharge.  Follow-up with podiatry in the outpatient clinic in 2 weeks.  Samara Deist A  10/21/2021, 2:50 PM

## 2021-10-21 NOTE — Anesthesia Postprocedure Evaluation (Signed)
Anesthesia Post Note  Patient: Charles [name]  Procedure(s) Performed: AMPUTATION TOE-2,3,4th Toes (Left: Toe)  Patient location during evaluation: PACU Anesthesia Type: General Level of consciousness: awake and alert Pain management: pain level controlled Vital Signs Assessment: post-procedure vital signs reviewed and stable Respiratory status: spontaneous breathing, nonlabored ventilation, respiratory function stable and patient connected to nasal cannula oxygen Cardiovascular status: blood pressure returned to baseline and stable Postop Assessment: no apparent nausea or vomiting Anesthetic complications: no   No notable events documented.   Last Vitals:  Vitals:   10/20/21 1916 10/20/21 2149  BP: (!) 143/86 (!) 142/71  Pulse: (!) 56 92  Resp: 13 18  Temp: 36.7 C 36.9 C  SpO2: 97% 97%    Last Pain:  Vitals:   10/20/21 2225  TempSrc:   PainSc: 0-No pain                 Martha Clan

## 2021-10-21 NOTE — TOC CM/SW Note (Signed)
Went by room for readmission prevention screen but patient was not in the room. Will try again later.  Charles [name], Charles [name]

## 2021-10-21 NOTE — Progress Notes (Signed)
Post op wedge shoe placed at bedside for patient use.
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
## 2021-10-22 ENCOUNTER — Encounter: Payer: Managed Care, Other (non HMO) | Admitting: Internal Medicine

## 2021-10-22 ENCOUNTER — Encounter: Payer: Self-pay | Admitting: Podiatry

## 2021-10-22 DIAGNOSIS — I96 Gangrene, not elsewhere classified: Secondary | ICD-10-CM | POA: Diagnosis not present

## 2021-10-22 LAB — CREATININE, SERUM
Creatinine, Ser: 0.83 mg/dL (ref 0.61–1.24)
GFR, Estimated: >60 mL/min (ref >60)

## 2021-10-22 LAB — GLUCOSE, CAPILLARY
Glucose-Capillary: 115 mg/dL — ABNORMAL HIGH (ref 70–99)
Glucose-Capillary: 173 mg/dL — ABNORMAL HIGH (ref 70–99)

## 2021-10-22 MED ORDER — FUROSEMIDE 20 MG PO TABS: ORAL_TABLET | ORAL | 0 refills | Status: DC

## 2021-10-22 MED ORDER — DOXYCYCLINE HYCLATE 100 MG PO TABS: 100.0000 mg | ORAL_TABLET | Freq: Two times a day (BID) | ORAL | 0 refills | Status: AC

## 2021-10-22 MED ORDER — ENOXAPARIN SODIUM 40 MG/0.4ML IJ SOSY
40.0000 mg | PREFILLED_SYRINGE | INTRAMUSCULAR | Status: DC
  Administered 2021-10-22: 40 mg via SUBCUTANEOUS
  Filled 2021-10-22: qty 0.4

## 2021-10-22 NOTE — TOC Transition Note (Signed)
Transition of Care [facility]) - CM/SW Discharge Note   Patient Details  Name: Charles [name] MRN: [id number] Date of Birth: Jul 17, 1960  [facility] CM/SW Contact:  Candie Chroman, LCSW Phone Number: 10/22/2021, 1:46 PM   Clinical Narrative:  Patient has orders to discharge home today. Per RN, patient said he doesn't need a RW. No further concerns. CSW signing off.   Final next level of care: Home/Self Care Barriers to Discharge: No Barriers Identified   Patient Goals and CMS Choice        Discharge Placement                Patient to be transferred to facility by: Daughter [name]   Patient and family notified of of transfer: 10/22/21  Discharge Plan and Services     Post Acute Care Choice:  (TBD: Maybe DME?)                               Social Determinants of Health (SDOH) Interventions     Readmission Risk Interventions    10/22/2021    1:14 PM 08/31/2021    9:49 AM  Readmission Risk Prevention Plan  Transportation Screening Complete Complete  PCP or Specialist Appt within 3-5 Days Complete Complete  HRI or [place]  Complete  Social Work Consult for [place] Planning/Counseling Complete Complete  Palliative Care Screening Not Applicable Not Applicable  Medication Review [employer]) Complete Complete

## 2021-10-22 NOTE — Progress Notes (Signed)
Patient discharging home, instructions given to patient, verbalized understanding. Daughter to transport patient home.

## 2021-10-22 NOTE — Progress Notes (Signed)
Bloody drainage visible on dressing; no dressing orders found; contacted podiatry MD; ok to use kerlix, square gauze and ace. Also clean with sterile water.
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
## 2021-10-22 NOTE — Plan of Care (Signed)
  Problem: Education: Goal: Knowledge of General Education information will improve Description: Including pain rating scale, medication(s)/side effects and non-pharmacologic comfort measures Outcome: Adequate for Discharge   Problem: Health Behavior/Discharge Planning: Goal: Ability to manage health-related needs will improve Outcome: Adequate for Discharge   Problem: Clinical Measurements: Goal: Ability to maintain clinical measurements within normal limits will improve Outcome: Adequate for Discharge Goal: Will remain free from infection Outcome: Adequate for Discharge Goal: Diagnostic test results will improve Outcome: Adequate for Discharge Goal: Respiratory complications will improve Outcome: Adequate for Discharge Goal: Cardiovascular complication will be avoided Outcome: Adequate for Discharge   Problem: Activity: Goal: Risk for activity intolerance will decrease Outcome: Adequate for Discharge   Problem: Nutrition: Goal: Adequate nutrition will be maintained Outcome: Adequate for Discharge   Problem: Coping: Goal: Level of anxiety will decrease Outcome: Adequate for Discharge   Problem: Elimination: Goal: Will not experience complications related to bowel motility Outcome: Adequate for Discharge Goal: Will not experience complications related to urinary retention Outcome: Adequate for Discharge   Problem: Pain Managment: Goal: General experience of comfort will improve Outcome: Adequate for Discharge   Problem: Safety: Goal: Ability to remain free from injury will improve Outcome: Adequate for Discharge   Problem: Skin Integrity: Goal: Risk for impaired skin integrity will decrease Outcome: Adequate for Discharge   Problem: Education: Goal: Ability to describe self-care measures that may prevent or decrease complications (Diabetes Survival Skills Education) will improve Outcome: Adequate for Discharge Goal: Individualized Educational Video(s) Outcome:  Adequate for Discharge   Problem: Coping: Goal: Ability to adjust to condition or change in health will improve Outcome: Adequate for Discharge   Problem: Fluid Volume: Goal: Ability to maintain a balanced intake and output will improve Outcome: Adequate for Discharge   Problem: Health Behavior/Discharge Planning: Goal: Ability to identify and utilize available resources and services will improve Outcome: Adequate for Discharge Goal: Ability to manage health-related needs will improve Outcome: Adequate for Discharge   Problem: Metabolic: Goal: Ability to maintain appropriate glucose levels will improve Outcome: Adequate for Discharge   Problem: Nutritional: Goal: Maintenance of adequate nutrition will improve Outcome: Adequate for Discharge Goal: Progress toward achieving an optimal weight will improve Outcome: Adequate for Discharge   Problem: Skin Integrity: Goal: Risk for impaired skin integrity will decrease Outcome: Adequate for Discharge   Problem: Tissue Perfusion: Goal: Adequacy of tissue perfusion will improve Outcome: Adequate for Discharge   Problem: Acute Rehab PT Goals(only PT should resolve) Goal: Patient Will Transfer Sit To/From Stand Outcome: Adequate for Discharge Goal: Pt Will Ambulate Outcome: Adequate for Discharge Goal: Pt Will Go Up/Down Stairs Outcome: Adequate for Discharge

## 2021-10-22 NOTE — Discharge Summary (Signed)
Physician Discharge Summary   Charles [name]  male DOB: 31-Jan-1961  JQB:[id number]  PCP: Mylinda Latina, PA-C  Admit date: 10/19/2021 Discharge date: 10/22/2021  Admitted From: home Disposition:  home CODE STATUS: Full code  Discharge Instructions     Discharge wound care:   Complete by: As directed    Dressing changes can be performed 2-3 times a week with a dry dressing as needed.  OrthoWedge shoe to place weightbearing on heel.  Follow-up with podiatry in the outpatient clinic in 2 weeks. [facility] Course:  For full details, please see H&P, progress notes, consult notes and ancillary notes.  Briefly,  Charles [name] is a 61 year old male with hypertension, depression, anxiety, hypothyroid, multiple toe amputation, who presented to emergency department for chief concerns of worsening left third toe odor and poor wound healing.   Osteomyelitis with gangrenous changes of left toes --s/p Amputation left second toe, third toe and fourth toe on 10/20/21 with Dr. Vickki Muff --pt was started on vanc and cefepime on presentation, and transitioned to doxy after surgical amputation per Dr. Vickki Muff.  Pt is discharged on 10 more days of doxycycline. --OrthoWedge shoe for him to place weightbearing on his heel --Dressing changes can be performed 2-3 times a week with a dry dressing as needed. --Follow-up with podiatry in the outpatient clinic in 2 weeks.  type II diabetes with neuropathy, controlled --not on any PO meds or insulin, diet control -- A1c 5.26 Apr 2021  hyperlipidemia  continue statins   Hypertension --continue amlodipine and Coreg   Hypothyroidism -- cont Synthroid   Unless noted above, medications under "STOP" list are ones pt was not taking PTA.  Discharge Diagnoses:  Principal Problem:   Necrotic toes (HCC) Active Problems:   Major depression, chronic   Hypothyroidism   GERD (gastroesophageal reflux disease)   Anxiety   BPH (benign  prostatic hyperplasia)   Obstructive sleep apnea   Type II diabetes mellitus (HCC)   Osteomyelitis (HCC)   Mixed hyperlipidemia   Hyperlipidemia, mixed   Obesity (BMI 30-39.9)   Left foot infection   30 Day Unplanned Readmission Risk Score    Flowsheet Row ED to Hosp-Admission (Current) from 10/19/2021 in [facility] (1A)  30 Day Unplanned Readmission Risk Score (%) 25.55 Filed at 10/22/2021 1200       This score is the patient's risk of an unplanned readmission within 30 days of being discharged (0 -100%). The score is based on dignosis, age, lab data, medications, orders, and past utilization.   Low:  0-14.9   Medium: 15-21.9   High: 22-29.9   Extreme: 30 and above         Discharge Instructions:  Allergies as of 10/22/2021       Reactions   Lisinopril Cough        Medication List     STOP taking these medications    amoxicillin 500 MG capsule Commonly known as: AMOXIL   tamsulosin 0.4 MG Caps capsule Commonly known as: FLOMAX       TAKE these medications    albuterol 108 (90 Base) MCG/ACT inhaler Commonly known as: VENTOLIN HFA Inhale 2 puffs into the lungs every 6 (six) hours as needed for wheezing or shortness of breath.   amLODipine 5 MG tablet Commonly known as: NORVASC Take 1 tablet (5 mg total) by mouth daily.   ascorbic acid 500 MG tablet Commonly known as: VITAMIN C Take 1  tablet (500 mg total) by mouth daily.   aspirin EC 81 MG tablet Take 81 mg by mouth daily. Swallow whole.   atorvastatin 40 MG tablet Commonly known as: LIPITOR Take one tab at bed time for cholesterol What changed:  how much to take how to take this when to take this   buPROPion 150 MG 24 hr tablet Commonly known as: Wellbutrin XL Take 1 tablet (150 mg total) by mouth daily.   busPIRone 30 MG tablet Commonly known as: BUSPAR TAKE ONE TABLET BY MOUTH 3 TIMES DAILY AS NEEDED   carvedilol 3.125 MG tablet Commonly known as:  COREG Take 1 tablet (3.125 mg total) by mouth 2 (two) times daily with a meal.   doxycycline 100 MG tablet Commonly known as: VIBRA-TABS Take 1 tablet (100 mg total) by mouth every 12 (twelve) hours for 10 days.   DULoxetine 60 MG capsule Commonly known as: CYMBALTA TAKE 1 CAPSULE BY MOUTH DAILY.   furosemide 20 MG tablet Commonly known as: LASIX as needed for swelling.  Home med. What changed: additional instructions   levothyroxine 75 MCG tablet Commonly known as: SYNTHROID TAKE ONE TABLET BY MOUTH EVERY DAY WITH BREAKFAST   lisdexamfetamine 40 MG capsule Commonly known as: Vyvanse Take 1 capsule (40 mg total) by mouth every morning.   oxyCODONE 15 MG immediate release tablet Commonly known as: ROXICODONE Take 15 mg by mouth every 6 (six) hours.   sildenafil 20 MG tablet Commonly known as: REVATIO Take 1 tablet (20 mg total) by mouth daily as needed (ED).   Vitamin D3 25 MCG tablet Commonly known as: Vitamin D Take 2 tablets (2,000 Units total) by mouth daily.   zinc sulfate 220 (50 Zn) MG capsule Take 1 capsule (220 mg total) by mouth daily.   zolpidem 10 MG tablet Commonly known as: AMBIEN TAKE 1 TABLET BY MOUTH AT BEDTIME AS NEEDED FOR SLEEP               Discharge Care Instructions  (From admission, onward)           Start     Ordered   10/22/21 0000  Discharge wound care:       Comments: Dressing changes can be performed 2-3 times a week with a dry dressing as needed.  OrthoWedge shoe to place weightbearing on heel.  Follow-up with podiatry in the outpatient clinic in 2 weeks. - -   10/22/21 1334             Follow-up Information     Samara Deist, DPM Follow up in 2 week(s).   Specialty: Podiatry Contact information: [place] [address] [phone]                 Allergies  Allergen Reactions   Lisinopril Cough     The results of significant diagnostics from this hospitalization  (including imaging, microbiology, ancillary and laboratory) are listed below for reference.   Consultations:   Procedures/Studies: US ARTERIAL ABI (SCREENING LOWER EXTREMITY)  Result Date: 10/20/2021 CLINICAL DATA:  Bilateral lower extremity rest pain, left lower extremity claudication, hypertension EXAM: NONINVASIVE PHYSIOLOGIC VASCULAR STUDY OF BILATERAL LOWER EXTREMITIES TECHNIQUE: Evaluation of both lower extremities were performed at rest, including calculation of ankle-brachial indices with single level Doppler, pressure and pulse volume recording. COMPARISON:  None Available. FINDINGS: Right ABI:  1.2 Left ABI:  1.44 Right Lower Extremity: Monophasic distal posterior tibial arterial waveforms. Normal Multiphasic dorsalis pedis waveform. Left Lower Extremity:  Multiphasic distal arterial waveforms. IMPRESSION: No decrease in either lower extremity ABI. However, the elevated left ABI suggests a degree of vascular noncompressibility which would decrease the sensitivity of the study. Electronically Signed   By: Lucrezia Europe M.D.   On: 10/20/2021 14:08   DG Foot Complete Left  Result Date: 10/19/2021 CLINICAL DATA:  Necrotic toe with foot swelling. EXAM: LEFT FOOT - COMPLETE 3+ VIEW COMPARISON:  08/29/2021. FINDINGS: Since the prior radiographs, the fifth toe is been resection as has the fifth metatarsal from the midshaft distally. The resected margin of the fifth metatarsal is smooth, with no bone resorption to suggest osteomyelitis. There is resorption of a portion of the distal tuft of the distal phalanx of the third toe, new since the prior exam. Subtle bone resorption is suggested along the dorsal anterior margin of the distal tuft of the second toe. Small marginal erosion along the medial head of the first metatarsal is stable from the prior exam. No fracture or bone lesion. Plantar calcaneal spur, stable. There is diffuse soft tissue swelling.  No soft tissue air. IMPRESSION: 1. Osteomyelitis of the  distal tuft of the distal phalanx of the third toe. Possible additional osteomyelitis of the distal tuft of the distal phalanx of the second toe. 2. No other evidence of osteomyelitis.  No fracture. 3. Surgical amputation of the left fifth toe and the distal half of the fifth metatarsal since the prior radiographs. Older resection of the great toe, stable. 4. Diffuse soft tissue swelling. Electronically Signed   By: Lajean Manes M.D.   On: 10/19/2021 14:22      Labs: BNP (last 3 results) No results for input(s): "BNP" in the last 8760 hours. Basic Metabolic Panel: Recent Labs  Lab 10/19/21 1326 10/20/21 0351 10/22/21 0508  NA 140 142  --   K 3.4* 4.7  --   CL 107 109  --   CO2 23 26  --   GLUCOSE 89 96  --   BUN 15 15  --   CREATININE 0.84 0.88 0.83  CALCIUM 8.8* 8.7*  --    Liver Function Tests: Recent Labs  Lab 10/19/21 1326  AST 25  ALT 17  ALKPHOS 103  BILITOT 0.5  PROT 7.3  ALBUMIN 3.5   No results for input(s): "LIPASE", "AMYLASE" in the last 168 hours. No results for input(s): "AMMONIA" in the last 168 hours. CBC: Recent Labs  Lab 10/19/21 1326 10/20/21 0351  WBC 5.6 4.1  NEUTROABS 3.2  --   HGB 11.8* 11.2*  HCT 38.0* 35.2*  MCV 88.4 88.4  PLT 320 282   Cardiac Enzymes: No results for input(s): "CKTOTAL", "CKMB", "CKMBINDEX", "TROPONINI" in the last 168 hours. BNP: Invalid input(s): "POCBNP" CBG: Recent Labs  Lab 10/21/21 1145 10/21/21 1619 10/21/21 2154 10/22/21 0722 10/22/21 1323  GLUCAP 120* 160* 116* 115* 173*   D-Dimer No results for input(s): "DDIMER" in the last 72 hours. Hgb A1c No results for input(s): "HGBA1C" in the last 72 hours. Lipid Profile No results for input(s): "CHOL", "HDL", "LDLCALC", "TRIG", "CHOLHDL", "LDLDIRECT" in the last 72 hours. Thyroid function studies No results for input(s): "TSH", "T4TOTAL", "T3FREE", "THYROIDAB" in the last 72 hours.  Invalid input(s): "FREET3" Anemia work up No results for input(s):  "VITAMINB12", "FOLATE", "FERRITIN", "TIBC", "IRON", "RETICCTPCT" in the last 72 hours. Urinalysis    Component Value Date/Time   COLORURINE YELLOW (A) 10/19/2021 2216   APPEARANCEUR CLEAR (A) 10/19/2021 2216   APPEARANCEUR Cloudy (A) 11/21/2020 1106  LABSPEC 1.014 10/19/2021 2216   LABSPEC 1.004 01/27/2013 2020   PHURINE 5.0 10/19/2021 2216   GLUCOSEU NEGATIVE 10/19/2021 2216   GLUCOSEU Negative 01/27/2013 2020   HGBUR NEGATIVE 10/19/2021 2216   BILIRUBINUR NEGATIVE 10/19/2021 2216   BILIRUBINUR Negative 11/21/2020 1106   BILIRUBINUR Negative 01/27/2013 2020   KETONESUR NEGATIVE 10/19/2021 2216   PROTEINUR NEGATIVE 10/19/2021 2216   NITRITE NEGATIVE 10/19/2021 2216   LEUKOCYTESUR NEGATIVE 10/19/2021 2216   LEUKOCYTESUR Negative 01/27/2013 2020   Sepsis Labs Recent Labs  Lab 10/19/21 1326 10/20/21 0351  WBC 5.6 4.1   Microbiology Recent Results (from the past 240 hour(s))  SARS Coronavirus 2 by RT PCR (hospital order, performed in [facility] hospital lab) *cepheid single result test* Anterior Nasal Swab     Status: None   Collection Time: 10/19/21  2:27 PM   Specimen: Anterior Nasal Swab  Result Value Ref Range Status   SARS Coronavirus 2 by RT PCR NEGATIVE NEGATIVE Final    Comment: (NOTE) SARS-CoV-2 target nucleic acids are NOT DETECTED.  The SARS-CoV-2 RNA is generally detectable in upper and lower respiratory specimens during the acute phase of infection. The lowest concentration of SARS-CoV-2 viral copies this assay can detect is 250 copies / mL. A negative result does not preclude SARS-CoV-2 infection and should not be used as the sole basis for treatment or other patient management decisions.  A negative result may occur with improper specimen collection / handling, submission of specimen other than nasopharyngeal swab, presence of viral mutation(s) within the areas targeted by this assay, and inadequate number of viral copies (<250 copies / mL). A negative  result must be combined with clinical observations, patient history, and epidemiological information.  Fact Sheet for Patients:   https://www.patel.info/  Fact Sheet for Healthcare Providers: https://hall.com/  This test is not yet approved or  cleared by the [place] FDA and has been authorized for detection and/or diagnosis of SARS-CoV-2 by FDA under an Emergency Use Authorization (EUA).  This EUA will remain in effect (meaning this test can be used) for the duration of the COVID-19 declaration under Section 564(b)(1) of the Act, 21 U.S.C. section 360bbb-3(b)(1), unless the authorization is terminated or revoked sooner.  Performed at [facility], [address], [place], [place] [address]   Surgical PCR screen     Status: Abnormal   Collection Time: 10/19/21 10:41 PM   Specimen: Nasal Mucosa; Nasal Swab  Result Value Ref Range Status   MRSA, PCR NEGATIVE NEGATIVE Final   Staphylococcus aureus POSITIVE (A) NEGATIVE Final    Comment: (NOTE) The Xpert SA Assay (FDA approved for NASAL specimens in patients 29 years of age and older), is one component of a comprehensive surveillance program. It is not intended to diagnose infection nor to guide or monitor treatment. Performed at [facility], [address] [place], [place] [address]      Total time spend on discharging this patient, including the last patient exam, discussing the hospital stay, instructions for ongoing care as it relates to all pertinent caregivers, as well as preparing the medical discharge records, prescriptions, and/or referrals as applicable, is 45 minutes.    Enzo Bi, MD  [facility] 10/22/2021, 1:35 PM
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
## 2021-10-23 ENCOUNTER — Encounter: Payer: Managed Care, Other (non HMO) | Admitting: Physician Assistant

## 2021-10-23 LAB — SURGICAL PATHOLOGY

## 2021-10-24 ENCOUNTER — Encounter: Payer: Managed Care, Other (non HMO) | Admitting: Physician Assistant

## 2021-10-27 ENCOUNTER — Ambulatory Visit (INDEPENDENT_AMBULATORY_CARE_PROVIDER_SITE_OTHER): Payer: Managed Care, Other (non HMO) | Admitting: Physician Assistant

## 2021-10-27 ENCOUNTER — Telehealth: Payer: Self-pay

## 2021-10-27 ENCOUNTER — Encounter: Payer: Self-pay | Admitting: Physician Assistant

## 2021-10-27 DIAGNOSIS — G4719 Other hypersomnia: Secondary | ICD-10-CM

## 2021-10-27 DIAGNOSIS — M7989 Other specified soft tissue disorders: Secondary | ICD-10-CM

## 2021-10-27 DIAGNOSIS — Z89429 Acquired absence of other toe(s), unspecified side: Secondary | ICD-10-CM | POA: Diagnosis not present

## 2021-10-27 DIAGNOSIS — I10 Essential (primary) hypertension: Secondary | ICD-10-CM | POA: Diagnosis not present

## 2021-10-27 MED ORDER — FUROSEMIDE 20 MG PO TABS: ORAL_TABLET | ORAL | 2 refills | Status: DC

## 2021-10-27 MED ORDER — LISDEXAMFETAMINE DIMESYLATE 40 MG PO CAPS: 40.0000 mg | ORAL_CAPSULE | Freq: Every morning | ORAL | 0 refills | Status: DC

## 2021-10-27 NOTE — Telephone Encounter (Signed)
Pamala Hurry from [facility] [employer] (phone [phone]) called to confirm patient is working with [facility]. States she will fax needed care coordination documentation. Provided triage fax number; Diminique will take to [facility] tomorrow.  Binnie Kand, RN
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
## 2021-10-28 ENCOUNTER — Encounter: Payer: Managed Care, Other (non HMO) | Admitting: Physician Assistant

## 2021-10-28 ENCOUNTER — Ambulatory Visit: Payer: Managed Care, Other (non HMO) | Admitting: Physician Assistant

## 2021-10-28 ENCOUNTER — Other Ambulatory Visit: Payer: Self-pay | Admitting: Physician Assistant

## 2021-10-28 DIAGNOSIS — G4719 Other hypersomnia: Secondary | ICD-10-CM

## 2021-10-29 ENCOUNTER — Encounter: Payer: Managed Care, Other (non HMO) | Admitting: Internal Medicine

## 2021-10-30 ENCOUNTER — Encounter: Payer: Managed Care, Other (non HMO) | Admitting: Physician Assistant

## 2021-10-31 ENCOUNTER — Encounter: Payer: Managed Care, Other (non HMO) | Admitting: Physician Assistant

## 2021-11-03 NOTE — Telephone Encounter (Signed)
Done

## 2021-11-13 ENCOUNTER — Other Ambulatory Visit: Payer: Self-pay | Admitting: Physician Assistant

## 2021-11-13 DIAGNOSIS — F5101 Primary insomnia: Secondary | ICD-10-CM

## 2021-12-04 ENCOUNTER — Telehealth: Payer: Self-pay

## 2021-12-04 ENCOUNTER — Other Ambulatory Visit: Payer: Self-pay | Admitting: Physician Assistant

## 2021-12-04 ENCOUNTER — Other Ambulatory Visit: Payer: Self-pay | Admitting: Internal Medicine

## 2021-12-04 DIAGNOSIS — F411 Generalized anxiety disorder: Secondary | ICD-10-CM

## 2021-12-04 DIAGNOSIS — G4719 Other hypersomnia: Secondary | ICD-10-CM

## 2021-12-04 DIAGNOSIS — R110 Nausea: Secondary | ICD-10-CM

## 2021-12-04 DIAGNOSIS — F329 Major depressive disorder, single episode, unspecified: Secondary | ICD-10-CM

## 2021-12-04 MED ORDER — LISDEXAMFETAMINE DIMESYLATE 40 MG PO CAPS: 40.0000 mg | ORAL_CAPSULE | ORAL | 0 refills | Status: DC

## 2021-12-04 MED ORDER — LISDEXAMFETAMINE DIMESYLATE 40 MG PO CAPS: 40.0000 mg | ORAL_CAPSULE | Freq: Every morning | ORAL | 0 refills | Status: DC

## 2021-12-05 NOTE — Telephone Encounter (Signed)
Send message that we send med

## 2021-12-09 ENCOUNTER — Other Ambulatory Visit
Admission: RE | Admit: 2021-12-09 | Discharge: 2021-12-09 | Disposition: A | Discharge status: Home / Self Care | Payer: Managed Care, Other (non HMO) | Source: Ambulatory Visit | Attending: Podiatry | Admitting: Podiatry

## 2021-12-09 ENCOUNTER — Ambulatory Visit: Payer: Managed Care, Other (non HMO) | Admitting: Infectious Diseases

## 2021-12-09 DIAGNOSIS — M86172 Other acute osteomyelitis, left ankle and foot: Secondary | ICD-10-CM | POA: Diagnosis not present

## 2021-12-09 DIAGNOSIS — B999 Unspecified infectious disease: Secondary | ICD-10-CM | POA: Insufficient documentation

## 2021-12-10 ENCOUNTER — Inpatient Hospital Stay
Admission: EM | Admit: 2021-12-10 | Discharge: 2021-12-15 | DRG: 475 | Disposition: A | Discharge status: Home Health | Payer: Managed Care, Other (non HMO) | Attending: Internal Medicine | Admitting: Internal Medicine

## 2021-12-10 ENCOUNTER — Other Ambulatory Visit: Payer: Self-pay

## 2021-12-10 ENCOUNTER — Emergency Department: Payer: Managed Care, Other (non HMO)

## 2021-12-10 ENCOUNTER — Encounter: Payer: Self-pay | Admitting: Internal Medicine

## 2021-12-10 DIAGNOSIS — Z6833 Body mass index (BMI) 33.0-33.9, adult: Secondary | ICD-10-CM

## 2021-12-10 DIAGNOSIS — F419 Anxiety disorder, unspecified: Secondary | ICD-10-CM | POA: Diagnosis present

## 2021-12-10 DIAGNOSIS — Z955 Presence of coronary angioplasty implant and graft: Secondary | ICD-10-CM | POA: Diagnosis not present

## 2021-12-10 DIAGNOSIS — K219 Gastro-esophageal reflux disease without esophagitis: Secondary | ICD-10-CM | POA: Diagnosis present

## 2021-12-10 DIAGNOSIS — Z89421 Acquired absence of other right toe(s): Secondary | ICD-10-CM | POA: Diagnosis not present

## 2021-12-10 DIAGNOSIS — F32A Depression, unspecified: Secondary | ICD-10-CM | POA: Diagnosis not present

## 2021-12-10 DIAGNOSIS — Z79890 Hormone replacement therapy (postmenopausal): Secondary | ICD-10-CM

## 2021-12-10 DIAGNOSIS — I2510 Atherosclerotic heart disease of native coronary artery without angina pectoris: Secondary | ICD-10-CM | POA: Diagnosis present

## 2021-12-10 DIAGNOSIS — Z7982 Long term (current) use of aspirin: Secondary | ICD-10-CM | POA: Diagnosis not present

## 2021-12-10 DIAGNOSIS — M86172 Other acute osteomyelitis, left ankle and foot: Principal | ICD-10-CM | POA: Diagnosis present

## 2021-12-10 DIAGNOSIS — M109 Gout, unspecified: Secondary | ICD-10-CM | POA: Diagnosis present

## 2021-12-10 DIAGNOSIS — D638 Anemia in other chronic diseases classified elsewhere: Secondary | ICD-10-CM | POA: Diagnosis present

## 2021-12-10 DIAGNOSIS — Z9884 Bariatric surgery status: Secondary | ICD-10-CM

## 2021-12-10 DIAGNOSIS — Z89439 Acquired absence of unspecified foot: Secondary | ICD-10-CM | POA: Diagnosis not present

## 2021-12-10 DIAGNOSIS — G894 Chronic pain syndrome: Secondary | ICD-10-CM | POA: Diagnosis present

## 2021-12-10 DIAGNOSIS — J45909 Unspecified asthma, uncomplicated: Secondary | ICD-10-CM | POA: Diagnosis present

## 2021-12-10 DIAGNOSIS — Z89422 Acquired absence of other left toe(s): Secondary | ICD-10-CM | POA: Diagnosis not present

## 2021-12-10 DIAGNOSIS — F329 Major depressive disorder, single episode, unspecified: Secondary | ICD-10-CM | POA: Diagnosis present

## 2021-12-10 DIAGNOSIS — Z833 Family history of diabetes mellitus: Secondary | ICD-10-CM

## 2021-12-10 DIAGNOSIS — M84475A Pathological fracture, left foot, initial encounter for fracture: Secondary | ICD-10-CM | POA: Diagnosis present

## 2021-12-10 DIAGNOSIS — I10 Essential (primary) hypertension: Secondary | ICD-10-CM | POA: Diagnosis present

## 2021-12-10 DIAGNOSIS — L97429 Non-pressure chronic ulcer of left heel and midfoot with unspecified severity: Secondary | ICD-10-CM | POA: Diagnosis present

## 2021-12-10 DIAGNOSIS — S92902A Unspecified fracture of left foot, initial encounter for closed fracture: Principal | ICD-10-CM

## 2021-12-10 DIAGNOSIS — Z8616 Personal history of COVID-19: Secondary | ICD-10-CM | POA: Diagnosis not present

## 2021-12-10 DIAGNOSIS — E669 Obesity, unspecified: Secondary | ICD-10-CM | POA: Diagnosis present

## 2021-12-10 DIAGNOSIS — Z79899 Other long term (current) drug therapy: Secondary | ICD-10-CM

## 2021-12-10 DIAGNOSIS — I252 Old myocardial infarction: Secondary | ICD-10-CM | POA: Diagnosis not present

## 2021-12-10 DIAGNOSIS — E1140 Type 2 diabetes mellitus with diabetic neuropathy, unspecified: Secondary | ICD-10-CM | POA: Diagnosis not present

## 2021-12-10 DIAGNOSIS — E785 Hyperlipidemia, unspecified: Secondary | ICD-10-CM | POA: Diagnosis present

## 2021-12-10 DIAGNOSIS — G629 Polyneuropathy, unspecified: Secondary | ICD-10-CM | POA: Diagnosis present

## 2021-12-10 DIAGNOSIS — E039 Hypothyroidism, unspecified: Secondary | ICD-10-CM | POA: Diagnosis present

## 2021-12-10 DIAGNOSIS — E1169 Type 2 diabetes mellitus with other specified complication: Secondary | ICD-10-CM | POA: Diagnosis not present

## 2021-12-10 DIAGNOSIS — L97529 Non-pressure chronic ulcer of other part of left foot with unspecified severity: Secondary | ICD-10-CM

## 2021-12-10 LAB — LACTIC ACID, PLASMA
Lactic Acid, Venous: 0.9 mmol/L (ref 0.5–1.9)
Lactic Acid, Venous: 1.5 mmol/L (ref 0.5–1.9)

## 2021-12-10 LAB — CBC WITH DIFFERENTIAL/PLATELET
Abs Immature Granulocytes: 0.02 10*3/uL (ref 0.00–0.07)
Basophils Absolute: 0.1 10*3/uL (ref 0.0–0.1)
Basophils Relative: 1 %
Eosinophils Absolute: 0.3 10*3/uL (ref 0.0–0.5)
Eosinophils Relative: 4 %
HCT: 32.1 % — ABNORMAL LOW (ref 39.0–52.0)
Hemoglobin: 10.2 g/dL — ABNORMAL LOW (ref 13.0–17.0)
Immature Granulocytes: 0 %
Lymphocytes Relative: 31 %
Lymphs Abs: 2.2 10*3/uL (ref 0.7–4.0)
MCH: 28.3 pg (ref 26.0–34.0)
MCHC: 31.8 g/dL (ref 30.0–36.0)
MCV: 89.2 fL (ref 80.0–100.0)
Monocytes Absolute: 0.7 10*3/uL (ref 0.1–1.0)
Monocytes Relative: 10 %
Neutro Abs: 3.7 10*3/uL (ref 1.7–7.7)
Neutrophils Relative %: 54 %
Platelets: 471 10*3/uL — ABNORMAL HIGH (ref 150–400)
RBC: 3.6 MIL/uL — ABNORMAL LOW (ref 4.22–5.81)
RDW: 15.9 % — ABNORMAL HIGH (ref 11.5–15.5)
WBC: 6.9 10*3/uL (ref 4.0–10.5)
nRBC: 0 % (ref 0.0–0.2)

## 2021-12-10 LAB — PREALBUMIN: Prealbumin: 19 mg/dL (ref 18–38)

## 2021-12-10 LAB — HEMOGLOBIN A1C
Hgb A1c MFr Bld: 5.4 % (ref 4.8–5.6)
Mean Plasma Glucose: 108.28 mg/dL

## 2021-12-10 LAB — BASIC METABOLIC PANEL
Anion gap: 5 (ref 5–15)
BUN: 22 mg/dL (ref 8–23)
CO2: 27 mmol/L (ref 22–32)
Calcium: 8.7 mg/dL — ABNORMAL LOW (ref 8.9–10.3)
Chloride: 108 mmol/L (ref 98–111)
Creatinine, Ser: 1.01 mg/dL (ref 0.61–1.24)
GFR, Estimated: >60 mL/min (ref >60)
Glucose, Bld: 129 mg/dL — ABNORMAL HIGH (ref 70–99)
Potassium: 3.9 mmol/L (ref 3.5–5.1)
Sodium: 140 mmol/L (ref 135–145)

## 2021-12-10 LAB — C-REACTIVE PROTEIN: CRP: 2.7 mg/dL — ABNORMAL HIGH (ref <1.0)

## 2021-12-10 LAB — SURGICAL PCR SCREEN
MRSA, PCR: NEGATIVE
Staphylococcus aureus: POSITIVE — AB

## 2021-12-10 LAB — SEDIMENTATION RATE: Sed Rate: 65 mm/hr — ABNORMAL HIGH (ref 0–20)

## 2021-12-10 MED ORDER — ALBUTEROL SULFATE (2.5 MG/3ML) 0.083% IN NEBU: 3.0000 mL | INHALATION_SOLUTION | Freq: Four times a day (QID) | RESPIRATORY_TRACT | Status: DC | PRN

## 2021-12-10 MED ORDER — BUPROPION HCL ER (XL) 150 MG PO TB24
150.0000 mg | ORAL_TABLET | Freq: Every day | ORAL | Status: DC
  Administered 2021-12-11 – 2021-12-15 (×5): 150 mg via ORAL
  Filled 2021-12-10 (×5): qty 1

## 2021-12-10 MED ORDER — VITAMIN D 25 MCG (1000 UNIT) PO TABS
2000.0000 [IU] | ORAL_TABLET | Freq: Every day | ORAL | Status: DC
Start: 2021-12-10 — End: 2021-12-16
  Administered 2021-12-11 – 2021-12-15 (×5): 2000 [IU] via ORAL
  Filled 2021-12-10 (×5): qty 2

## 2021-12-10 MED ORDER — ACETAMINOPHEN 650 MG RE SUPP: 650.0000 mg | Freq: Four times a day (QID) | RECTAL | Status: DC | PRN

## 2021-12-10 MED ORDER — SODIUM CHLORIDE 0.9 % IV SOLN
2.0000 g | INTRAVENOUS | Status: DC
  Administered 2021-12-11: 2 g via INTRAVENOUS
  Filled 2021-12-10 (×2): qty 20
  Filled 2021-12-10: qty 2

## 2021-12-10 MED ORDER — OXYCODONE HCL 5 MG PO TABS
15.0000 mg | ORAL_TABLET | Freq: Four times a day (QID) | ORAL | Status: DC
  Administered 2021-12-10 – 2021-12-11 (×5): 15 mg via ORAL
  Filled 2021-12-10 (×5): qty 3

## 2021-12-10 MED ORDER — CHLORHEXIDINE GLUCONATE CLOTH 2 % EX PADS
6.0000 | MEDICATED_PAD | Freq: Every day | CUTANEOUS | Status: AC
  Administered 2021-12-11 – 2021-12-15 (×5): 6 via TOPICAL

## 2021-12-10 MED ORDER — DULOXETINE HCL 30 MG PO CPEP
60.0000 mg | ORAL_CAPSULE | Freq: Every day | ORAL | Status: DC
  Administered 2021-12-11 – 2021-12-15 (×5): 60 mg via ORAL
  Filled 2021-12-10 (×5): qty 2

## 2021-12-10 MED ORDER — SODIUM CHLORIDE 0.9% FLUSH
3.0000 mL | Freq: Two times a day (BID) | INTRAVENOUS | Status: DC
  Administered 2021-12-10 – 2021-12-15 (×11): 3 mL via INTRAVENOUS

## 2021-12-10 MED ORDER — CARVEDILOL 3.125 MG PO TABS
3.1250 mg | ORAL_TABLET | Freq: Two times a day (BID) | ORAL | Status: DC
  Administered 2021-12-10 – 2021-12-15 (×11): 3.125 mg via ORAL
  Filled 2021-12-10 (×12): qty 1

## 2021-12-10 MED ORDER — ONDANSETRON HCL 4 MG/2ML IJ SOLN: 4.0000 mg | Freq: Four times a day (QID) | INTRAMUSCULAR | Status: DC | PRN

## 2021-12-10 MED ORDER — AMLODIPINE BESYLATE 5 MG PO TABS
5.0000 mg | ORAL_TABLET | Freq: Every day | ORAL | Status: DC
  Administered 2021-12-11 – 2021-12-15 (×5): 5 mg via ORAL
  Filled 2021-12-10 (×5): qty 1

## 2021-12-10 MED ORDER — LEVOTHYROXINE SODIUM 25 MCG PO TABS
75.0000 ug | ORAL_TABLET | Freq: Every day | ORAL | Status: DC
  Administered 2021-12-11 – 2021-12-15 (×4): 75 ug via ORAL
  Filled 2021-12-10 (×5): qty 1

## 2021-12-10 MED ORDER — ATORVASTATIN CALCIUM 80 MG PO TABS
40.0000 mg | ORAL_TABLET | Freq: Every day | ORAL | Status: DC
  Administered 2021-12-10 – 2021-12-14 (×5): 40 mg via ORAL
  Filled 2021-12-10: qty 0.5
  Filled 2021-12-10 (×3): qty 2
  Filled 2021-12-10: qty 0.5
  Filled 2021-12-10: qty 2

## 2021-12-10 MED ORDER — SODIUM CHLORIDE 0.9 % IV SOLN: 250.0000 mL | INTRAVENOUS | Status: DC | PRN

## 2021-12-10 MED ORDER — ZINC SULFATE 220 (50 ZN) MG PO CAPS
220.0000 mg | ORAL_CAPSULE | Freq: Every day | ORAL | Status: DC
  Administered 2021-12-11 – 2021-12-15 (×5): 220 mg via ORAL
  Filled 2021-12-10 (×5): qty 1

## 2021-12-10 MED ORDER — ZOLPIDEM TARTRATE 5 MG PO TABS: 10.0000 mg | ORAL_TABLET | Freq: Every evening | ORAL | Status: DC | PRN

## 2021-12-10 MED ORDER — BUSPIRONE HCL 10 MG PO TABS: 30.0000 mg | ORAL_TABLET | Freq: Three times a day (TID) | ORAL | Status: DC | PRN

## 2021-12-10 MED ORDER — ONDANSETRON HCL 4 MG PO TABS: 4.0000 mg | ORAL_TABLET | Freq: Four times a day (QID) | ORAL | Status: DC | PRN

## 2021-12-10 MED ORDER — METRONIDAZOLE 500 MG/100ML IV SOLN
500.0000 mg | Freq: Two times a day (BID) | INTRAVENOUS | Status: DC
  Administered 2021-12-10 – 2021-12-12 (×5): 500 mg via INTRAVENOUS
  Filled 2021-12-10 (×4): qty 100

## 2021-12-10 MED ORDER — LISDEXAMFETAMINE DIMESYLATE 20 MG PO CAPS
40.0000 mg | ORAL_CAPSULE | Freq: Every morning | ORAL | Status: DC
  Administered 2021-12-11 – 2021-12-15 (×5): 40 mg via ORAL
  Filled 2021-12-10 (×5): qty 2

## 2021-12-10 MED ORDER — BUSPIRONE HCL 5 MG PO TABS
30.0000 mg | ORAL_TABLET | Freq: Every day | ORAL | Status: DC
Start: 2021-12-10 — End: 2021-12-10

## 2021-12-10 MED ORDER — ACETAMINOPHEN 325 MG PO TABS
650.0000 mg | ORAL_TABLET | Freq: Four times a day (QID) | ORAL | Status: DC | PRN
  Administered 2021-12-15: 650 mg via ORAL
  Filled 2021-12-10 (×2): qty 2

## 2021-12-10 MED ORDER — SODIUM CHLORIDE 0.9 % IV SOLN
2.0000 g | Freq: Once | INTRAVENOUS | Status: AC
Start: 2021-12-10 — End: 2021-12-10
  Administered 2021-12-10: 2 g via INTRAVENOUS
  Filled 2021-12-10: qty 12.5

## 2021-12-10 MED ORDER — MUPIROCIN 2 % EX OINT
1.0000 | TOPICAL_OINTMENT | Freq: Two times a day (BID) | CUTANEOUS | Status: AC
  Administered 2021-12-10 – 2021-12-15 (×10): 1 via NASAL
  Filled 2021-12-10: qty 22

## 2021-12-10 MED ORDER — SODIUM CHLORIDE 0.9% FLUSH: 3.0000 mL | INTRAVENOUS | Status: DC | PRN

## 2021-12-10 MED ORDER — VANCOMYCIN HCL IN DEXTROSE 1-5 GM/200ML-% IV SOLN
1000.0000 mg | Freq: Once | INTRAVENOUS | Status: AC
Start: 2021-12-10 — End: 2021-12-10
  Administered 2021-12-10: 1000 mg via INTRAVENOUS
  Filled 2021-12-10: qty 200

## 2021-12-10 MED ORDER — ASCORBIC ACID 500 MG PO TABS
500.0000 mg | ORAL_TABLET | Freq: Every day | ORAL | Status: DC
  Administered 2021-12-11 – 2021-12-15 (×4): 500 mg via ORAL
  Filled 2021-12-10 (×4): qty 1

## 2021-12-10 NOTE — Assessment & Plan Note (Addendum)
Patient was sent to the ER for evaluation of left foot pain and ulceration of the plantar surface of the left foot with purulent drainage and imaging  is suggestive of osteomyelitis. Patient has a history of neuropathic foot ulcer and has had multiple amputations We will treat patient empirically with IV Rocephin and metronidazole Podiatry consult Pain control
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
## 2021-12-10 NOTE — ED Provider Notes (Signed)
[facility] Provider Note    Event Date/Time   First MD Initiated Contact with Patient 12/10/21 0701     (approximate)   History   Foot Pain   HPI  Charles [name] is a 61 y.o. male   Past medical history of forefoot amputations to the left by Dr. Vickki Muff podiatrist, hypertension, hyperlipidemia, CAD presents with left foot ulceration and pathological fracture on x-ray by podiatrist yesterday, sent in by podiatrist for antibiotics and potential amputation.  Patient states no trauma to the foot. Increasing pain with ambulation. No purulence, redness, or systemic infectious symptoms. He states that the ulceration appears to be healing, though pain with ambulation has been increasing.  History was obtained via patient and review of external notes.      Physical Exam   Triage Vital Signs: ED Triage Vitals  Enc Vitals Group     BP 12/10/21 0626 (!) 144/84     Pulse Rate 12/10/21 0626 71     Resp 12/10/21 0626 18     Temp 12/10/21 0626 97.9 F (36.6 C)     Temp Source 12/10/21 0626 Oral     SpO2 12/10/21 0626 98 %     Weight 12/10/21 0624 251 lb (113.9 kg)     Height 12/10/21 0624 '6\' 1"'$  (1.854 m)     Head Circumference --      Peak Flow --      Pain Score 12/10/21 0623 7     Pain Loc --      Pain Edu? --      Excl. in [place]? --     Most recent vital signs: Vitals:   12/10/21 1137 12/10/21 1448  BP: (!) 145/89 129/73  Pulse:  65  Resp: 16   Temp: 97.7 F (36.5 C) 97.8 F (36.6 C)  SpO2:  97%    General: Awake, no distress.  Toxic appearance CV:  Good peripheral perfusion.  euvolemic Resp:  Normal effort.  Abd:  No distention.  Nontender Other:  Amputation on left foot with skin and distal ulcerations appearing clean and dry, no purulence, no redness, no obvious crepitus.   ED Results / Procedures / Treatments   Labs (all labs ordered are listed, but only abnormal results are displayed) Labs Reviewed  BASIC METABOLIC PANEL -  Abnormal; Notable for the following components:      Result Value   Glucose, Bld 129 (*)    Calcium 8.7 (*)    All other components within normal limits  CBC WITH DIFFERENTIAL/PLATELET - Abnormal; Notable for the following components:   RBC 3.60 (*)    Hemoglobin 10.2 (*)    HCT 32.1 (*)    RDW 15.9 (*)    Platelets 471 (*)    All other components within normal limits  SEDIMENTATION RATE - Abnormal; Notable for the following components:   Sed Rate 65 (*)    All other components within normal limits  LACTIC ACID, PLASMA  LACTIC ACID, PLASMA  C-REACTIVE PROTEIN  PREALBUMIN  HEMOGLOBIN A1C     I reviewed labs and they are notable for Cr 1.01 baseline     RADIOLOGY I dependently reviewed and interpreted x-ray of the left foot and see erosive changes to the third distal  phalanx.   PROCEDURES:  Critical Care performed: No  Procedures   MEDICATIONS ORDERED IN ED: Medications  oxyCODONE (Oxy IR/ROXICODONE) immediate release tablet 15 mg (15 mg Oral Given 12/10/21 0945)  amLODipine (NORVASC) tablet 5  mg (5 mg Oral Not Given 12/10/21 0941)  atorvastatin (LIPITOR) tablet 40 mg (has no administration in time range)  carvedilol (COREG) tablet 3.125 mg (has no administration in time range)  buPROPion (WELLBUTRIN XL) 24 hr tablet 150 mg (150 mg Oral Not Given 12/10/21 0941)  DULoxetine (CYMBALTA) DR capsule 60 mg (60 mg Oral Not Given 12/10/21 0940)  lisdexamfetamine (VYVANSE) capsule 40 mg (40 mg Oral Not Given 12/10/21 0941)  zolpidem (AMBIEN) tablet 10 mg (has no administration in time range)  levothyroxine (SYNTHROID) tablet 75 mcg (75 mcg Oral Not Given 12/10/21 0940)  ascorbic acid (VITAMIN C) tablet 500 mg (500 mg Oral Not Given 12/10/21 0941)  cholecalciferol (VITAMIN D3) 25 MCG (1000 UNIT) tablet 2,000 Units (2,000 Units Oral Not Given 12/10/21 0939)  zinc sulfate capsule 220 mg (220 mg Oral Not Given 12/10/21 0940)  albuterol (PROVENTIL) (2.5 MG/3ML) 0.083% nebulizer solution 3  mL (has no administration in time range)  cefTRIAXone (ROCEPHIN) 2 g in sodium chloride 0.9 % 100 mL IVPB (has no administration in time range)    And  metroNIDAZOLE (FLAGYL) IVPB 500 mg (0 mg Intravenous Stopped 12/10/21 1139)  sodium chloride flush (NS) 0.9 % injection 3 mL (3 mLs Intravenous Given 12/10/21 0959)  sodium chloride flush (NS) 0.9 % injection 3 mL (has no administration in time range)  0.9 %  sodium chloride infusion (has no administration in time range)  acetaminophen (TYLENOL) tablet 650 mg (has no administration in time range)    Or  acetaminophen (TYLENOL) suppository 650 mg (has no administration in time range)  ondansetron (ZOFRAN) tablet 4 mg (has no administration in time range)    Or  ondansetron (ZOFRAN) injection 4 mg (has no administration in time range)  busPIRone (BUSPAR) tablet 30 mg (has no administration in time range)  ceFEPIme (MAXIPIME) 2 g in sodium chloride 0.9 % 100 mL IVPB (0 g Intravenous Stopped 12/10/21 0816)  vancomycin (VANCOCIN) IVPB 1000 mg/200 mL premix (0 mg Intravenous Stopped 12/10/21 0904)    Consultants:  I spoke with podiatrist on-call regarding care plan for this patient - antibiotics and admission.  IMPRESSION / MDM / ASSESSMENT AND PLAN / ED COURSE  I reviewed the triage vital signs and the nursing notes.                              Differential diagnosis includes, but is not limited to, osteomyelitis, fracture, cellulitis, abscess, systemic infection.   MDM: Foot amputation with ulceration and pathological fracture sent in by podiatry for IV antibiotics and admission for procedure.  Considered infection, though locally the wound appears not infected has no systemic signs of infection.  If early infection, will start IV antibiotics in anticipation for amputation by podiatry.  Dispo: After careful consideration of this patient's presentation, medical and social risk factors, and evaluation in the emergency department I engaged in  shared decision making with the patient and/or their representative to consider admission or observation and this patient was ultimately admitted because in consultation with podiatry, anticipate amputation within the next few days, start IV antibiotics..   Patient's presentation is most consistent with acute presentation with potential threat to life or bodily function.       FINAL CLINICAL IMPRESSION(S) / ED DIAGNOSES   Final diagnoses:  Closed fracture of left foot, initial encounter  Ulcer of left foot, unspecified ulcer stage ([place])     Rx / DC Orders  ED Discharge Orders     None        Note:  This document was prepared using Dragon voice recognition software and may include unintentional dictation errors.    Lucillie Garfinkel, MD 12/10/21 [phone]

## 2021-12-10 NOTE — Assessment & Plan Note (Signed)
Stable Continue Synthroid 

## 2021-12-10 NOTE — Assessment & Plan Note (Signed)
Patient is on multiple antidepressants which would be resumed during this hospitalization

## 2021-12-10 NOTE — Assessment & Plan Note (Signed)
H&H is stable ?Monitor closely during this hospitalization ?
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
## 2021-12-10 NOTE — Assessment & Plan Note (Signed)
Blood pressure is stable Continue carvedilol and amlodipine

## 2021-12-10 NOTE — Assessment & Plan Note (Signed)
BMI 62.37 Complicates overall prognosis and care Patient exercise has been discussed with patient in detail

## 2021-12-10 NOTE — ED Triage Notes (Signed)
FIRST NURSE NOTE:  Pt states Dr. Vickki Muff instructed him to come to ED this morning for possible amputation to R foot, pt had Xray done outpatient yesterday.

## 2021-12-10 NOTE — Plan of Care (Signed)

## 2021-12-10 NOTE — ED Notes (Signed)
Informed RN bed assigned 

## 2021-12-11 ENCOUNTER — Ambulatory Visit: Payer: Managed Care, Other (non HMO) | Admitting: Infectious Diseases

## 2021-12-11 DIAGNOSIS — M86172 Other acute osteomyelitis, left ankle and foot: Secondary | ICD-10-CM | POA: Diagnosis not present

## 2021-12-11 LAB — CBC
HCT: 31.6 % — ABNORMAL LOW (ref 39.0–52.0)
Hemoglobin: 9.9 g/dL — ABNORMAL LOW (ref 13.0–17.0)
MCH: 28.4 pg (ref 26.0–34.0)
MCHC: 31.3 g/dL (ref 30.0–36.0)
MCV: 90.5 fL (ref 80.0–100.0)
Platelets: 433 10*3/uL — ABNORMAL HIGH (ref 150–400)
RBC: 3.49 MIL/uL — ABNORMAL LOW (ref 4.22–5.81)
RDW: 15.8 % — ABNORMAL HIGH (ref 11.5–15.5)
WBC: 5.6 10*3/uL (ref 4.0–10.5)
nRBC: 0 % (ref 0.0–0.2)

## 2021-12-11 LAB — PROCALCITONIN: Procalcitonin: <0.1 ng/mL

## 2021-12-11 LAB — BASIC METABOLIC PANEL
Anion gap: 3 — ABNORMAL LOW (ref 5–15)
BUN: 16 mg/dL (ref 8–23)
CO2: 28 mmol/L (ref 22–32)
Calcium: 8.7 mg/dL — ABNORMAL LOW (ref 8.9–10.3)
Chloride: 111 mmol/L (ref 98–111)
Creatinine, Ser: 0.94 mg/dL (ref 0.61–1.24)
GFR, Estimated: >60 mL/min (ref >60)
Glucose, Bld: 111 mg/dL — ABNORMAL HIGH (ref 70–99)
Potassium: 4.1 mmol/L (ref 3.5–5.1)
Sodium: 142 mmol/L (ref 135–145)

## 2021-12-11 LAB — GLUCOSE, CAPILLARY: Glucose-Capillary: 104 mg/dL — ABNORMAL HIGH (ref 70–99)

## 2021-12-11 MED ORDER — METAXALONE 800 MG PO TABS
1600.0000 mg | ORAL_TABLET | Freq: Three times a day (TID) | ORAL | Status: DC
  Administered 2021-12-11 – 2021-12-15 (×14): 1600 mg via ORAL
  Filled 2021-12-11 (×15): qty 2

## 2021-12-11 MED ORDER — SENNOSIDES-DOCUSATE SODIUM 8.6-50 MG PO TABS
1.0000 | ORAL_TABLET | Freq: Two times a day (BID) | ORAL | Status: DC
  Administered 2021-12-11 – 2021-12-15 (×9): 1 via ORAL
  Filled 2021-12-11 (×9): qty 1

## 2021-12-11 MED ORDER — POLYETHYLENE GLYCOL 3350 17 G PO PACK
17.0000 g | PACK | Freq: Every day | ORAL | Status: DC
  Administered 2021-12-11 – 2021-12-15 (×5): 17 g via ORAL
  Filled 2021-12-11 (×5): qty 1

## 2021-12-11 MED ORDER — BISACODYL 10 MG RE SUPP: 10.0000 mg | Freq: Every day | RECTAL | Status: DC | PRN

## 2021-12-11 MED ORDER — OXYCODONE HCL ER 10 MG PO T12A: 10.0000 mg | EXTENDED_RELEASE_TABLET | ORAL | Status: DC

## 2021-12-11 MED ORDER — ENSURE MAX PROTEIN PO LIQD
11.0000 [oz_av] | Freq: Every day | ORAL | Status: DC
  Administered 2021-12-11 – 2021-12-14 (×4): 11 [oz_av] via ORAL
  Filled 2021-12-11: qty 330

## 2021-12-11 MED ORDER — OXYCODONE HCL 5 MG PO TABS
10.0000 mg | ORAL_TABLET | ORAL | Status: DC | PRN
  Administered 2021-12-11 – 2021-12-15 (×15): 10 mg via ORAL
  Filled 2021-12-11 (×15): qty 2

## 2021-12-11 MED ORDER — JUVEN PO PACK
1.0000 | PACK | Freq: Two times a day (BID) | ORAL | Status: DC
  Administered 2021-12-11 – 2021-12-15 (×9): 1 via ORAL

## 2021-12-11 MED ORDER — ADULT MULTIVITAMIN W/MINERALS CH
1.0000 | ORAL_TABLET | Freq: Every day | ORAL | Status: DC
  Administered 2021-12-11 – 2021-12-15 (×5): 1 via ORAL
  Filled 2021-12-11 (×5): qty 1

## 2021-12-11 NOTE — Progress Notes (Signed)
Initial Nutrition Assessment  DOCUMENTATION CODES:   Obesity unspecified  INTERVENTION:   -MVI with minerals daily -1 packet Juven BID, each packet provides 95 calories, 2.5 grams of protein (collagen), and 9.8 grams of carbohydrate (3 grams sugar); also contains 7 grams of L-arginine and L-glutamine, 300 mg vitamin C, 15 mg vitamin E, 1.2 mcg vitamin B-12, 9.5 mg zinc, 200 mg calcium, and 1.5 g  Calcium Beta-hydroxy-Beta-methylbutyrate to support wound healing  -Ensure Max po daily, each supplement provides 150 kcal and 30 grams of protein -Liberalize diet to regular for widest variety of meal selections -Double protein portions with meals  NUTRITION DIAGNOSIS:   Increased nutrient needs related to wound healing as evidenced by estimated needs.  GOAL:   Patient will meet greater than or equal to 90% of their needs  MONITOR:   PO intake, Supplement acceptance  REASON FOR ASSESSMENT:   Consult Assessment of nutrition requirement/status  ASSESSMENT:   Pt with medical history significant for obesity, depression, hypertension, anxiety, hypothyroidism, status post multiple toe amputations in July.,2023 by Dr. Vickki Muff for osteomyelitis with gangrenous changes of left toes.  Pt admitted with osteomyelitis of lt foot.   Reviewed I/O's: -495 ml x 24 hours  UOP: 1.4 L x 24 hours  Spoke with pt at bedside, who reports feeling better today. He reports that he has had multiple episodes of osteomyelitis and has had multiple amputations on both feet. Pt is followed by podiatry as outpatient and takes multiple vitamins at home including: 2400 mcg vitamin A daily, 50 mg zinc daily, 1000 mcg vitamin B-12 daily, b-complex with vitamin C, 250 mg magnesium daily, 1000 mg vitamin C daily, 180 mg vitamin E daily, 25 mcg vitamin D3, 65 mg iron daily, MVI daily, and 600 mg calcium daily.   Pt reports that he has a great appetite and consumes 3 meals per day, which consists of a meat, starch, and  vegetables. Per pt, he cooks his meals at home daily. Noted meal completion 80%. He shares that he ate all of his breakfast this morning and was eating a rice kripsie treat snack during visit.   Pt denies any weight loss. Pt has experienced a 4% weight loss over the past 3 months, which is not significant for time frame.  4% x 3 months  Discussed importance of good meal and supplement intake to promote healing. Pt amenable to supplements.   Medications reviewed and include vitamin C, vitamin D3, and zinc sulfate.   Lab Results  Component Value Date   HGBA1C 5.4 12/10/2021   PTA DM medications are none.   Labs reviewed: CBGS: 104 (inpatient orders for glycemic control are none).    NUTRITION - FOCUSED PHYSICAL EXAM:  Flowsheet Row Most Recent Value  Orbital Region No depletion  Upper Arm Region No depletion  Thoracic and Lumbar Region No depletion  Buccal Region No depletion  Temple Region No depletion  Clavicle Bone Region No depletion  Clavicle and Acromion Bone Region No depletion  Scapular Bone Region No depletion  Dorsal Hand No depletion  Patellar Region No depletion  Anterior Thigh Region No depletion  Posterior Calf Region No depletion  Edema (RD Assessment) Mild  Hair Reviewed  Eyes Reviewed  Mouth Reviewed  Skin Reviewed  Nails Reviewed       Diet Order:   Diet Order             Diet NPO time specified  Diet effective midnight  Diet Heart Room service appropriate? Yes; Fluid consistency: Thin  Diet effective now                   EDUCATION NEEDS:   Education needs have been addressed  Skin:  Skin Assessment: Reviewed RN Assessment  Last BM:  12/10/21  Height:   Ht Readings from Last 1 Encounters:  12/10/21 '6\' 1"'$  (1.854 m)    Weight:   Wt Readings from Last 1 Encounters:  12/10/21 113.9 kg    Ideal Body Weight:  83.6 kg  BMI:  Body mass index is 33.12 kg/m.  Estimated Nutritional Needs:   Kcal:  2300-2500  Protein:   125-150 grams  Fluid:  > 2 L    Loistine Chance, RD, LDN, [place] Registered Dietitian II Certified Diabetes Care and Education Specialist Please refer to [facility] for RD and/or RD on-call/weekend/after hours pager

## 2021-12-11 NOTE — Plan of Care (Signed)
  Problem: Clinical Measurements: Goal: Ability to maintain clinical measurements within normal limits will improve Outcome: Progressing   Problem: Clinical Measurements: Goal: Will remain free from infection Outcome: Progressing   

## 2021-12-11 NOTE — Plan of Care (Signed)

## 2021-12-11 NOTE — Progress Notes (Signed)
PROGRESS NOTE    Charles [name]  TIR:[id number] DOB: 18-Mar-1961 DOA: 12/10/2021 PCP: Mylinda Latina, PA-C    Brief Narrative:   61 y.o. male with medical history significant for obesity, depression, hypertension, anxiety, hypothyroidism, status post multiple toe amputations in July.,2023 by Dr. Vickki Muff for osteomyelitis with gangrenous changes of left toes.   Patient presents to the ER today for evaluation of  left foot pain and ulceration with purulent drainage . He had a foot x ray done at his podiatrist's office which showed  interval pathological fracture of the distal third metatarsal at the  metatarsal head region.  Osteopenia of the distal second metatarsal is noted with areas of air within the soft tissue of the second metatarsal head.  This consistent with history of ulceration.  He was advised to go to the ER by his podiatrist for further evaluation. He denies having any fever, no chills, no chest pain, no shortness of breath, no abdominal pain, no nausea, no vomiting, no changes in his bowel habits, no urinary symptoms, no blurred vision or focal deficit. Left foot x-ray showed interval resection of second through fourth toes with soft tissue lucency surrounding the heads of the third and fourth metatarsals likely corresponding to the reported ulceration. There is cortical irregularity with erosive change at the distal metaphysis of third metatarsal raising concern for osteomyelitis. Patient will be admitted to the hospital for IV antibiotics and podiatry will be consulted.  Podiatry consulted.  Plan for transmetatarsal amputation in operating room on 8/25   Assessment & Plan:   Principal Problem:   Osteomyelitis of ankle or foot, acute, left (HCC) Active Problems:   Major depression, chronic   Hypothyroidism   Essential hypertension   Obesity (BMI 30-39.9)   Anemia of chronic disease  Osteomyelitis of ankle or foot, acute, left (HCC) Patient was sent to the ER for  evaluation of left foot pain and ulceration of the plantar surface of the left foot with purulent drainage and imaging  is suggestive of osteomyelitis. Patient has a history of neuropathic foot ulcer and has had multiple amputations Plan: Empiric antibiotics Multimodal pain control N.p.o. after midnight OR tomorrow   Major depression, chronic Patient is on multiple antidepressants which would be resumed during this hospitalization   Hypothyroidism Stable Continue Synthroid   Essential hypertension Blood pressure is stable Continue carvedilol and amlodipine     Anemia of chronic disease H&H is stable Monitor closely during this hospitalization   Obesity (BMI 30-39.9) BMI 67.61 Complicates overall prognosis and care Patient exercise has been discussed with patient in detail  Chronic pain syndrome Home pain regimen reinitiated pharmacy dosing assistance   DVT prophylaxis: SCD Code Status: Full Family Communication: None Disposition Plan: Status is: Inpatient Remains inpatient appropriate because: Acute osteomyelitis on IV antibiotics.  Need transmetatarsal amputation.  Surgery planned 8/25.   Level of care: Med-Surg  Consultants:  Podiatry  Procedures:  None  Antimicrobials: Ceftriaxone Metronidazole   Subjective: Seen and examined.  Pain mild to moderate.  Resting comfortably in bed.  No visible distress.  No complaints  Objective: Vitals:   12/10/21 1137 12/10/21 1448 12/10/21 2233 12/11/21 0328  BP: (!) 145/89 129/73 127/67 124/81  Pulse:  65 (!) 59 (!) 58  Resp: 16   17  Temp: 97.7 F (36.5 C) 97.8 F (36.6 C) 98.1 F (36.7 C) 98 F (36.7 C)  TempSrc: Oral     SpO2:  97% 98% 97%  Weight:  Height:        Intake/Output Summary (Last 24 hours) at 12/11/2021 1201 Last data filed at 12/11/2021 1100 Gross per 24 hour  Intake 820 ml  Output 1375 ml  Net -555 ml   Filed Weights   12/10/21 0624  Weight: 113.9 kg    Examination:  General  exam: Appears calm and comfortable  Respiratory system: Clear to auscultation. Respiratory effort normal. Cardiovascular system: S1-S2, RRR, no murmurs, no pedal edema Gastrointestinal system: Soft, NT/ND, normal bowel sounds Central nervous system: Alert and oriented. No focal neurological deficits. Extremities: Amputation of toes on the left foot.  Ulceration over the plantar surface of the left foot  Skin: No rashes, lesions or ulcers Psychiatry: Judgement and insight appear normal. Mood & affect appropriate.     Data Reviewed: I have personally reviewed following labs and imaging studies  CBC: Recent Labs  Lab 12/10/21 0709 12/11/21 0425  WBC 6.9 5.6  NEUTROABS 3.7  --   HGB 10.2* 9.9*  HCT 32.1* 31.6*  MCV 89.2 90.5  PLT 471* 191*   Basic Metabolic Panel: Recent Labs  Lab 12/10/21 0709 12/11/21 0425  NA 140 142  K 3.9 4.1  CL 108 111  CO2 27 28  GLUCOSE 129* 111*  BUN 22 16  CREATININE 1.01 0.94  CALCIUM 8.7* 8.7*   GFR: Estimated Creatinine Clearance: 109.1 mL/min (by C-G formula based on SCr of 0.94 mg/dL). Liver Function Tests: No results for input(s): "AST", "ALT", "ALKPHOS", "BILITOT", "PROT", "ALBUMIN" in the last 168 hours. No results for input(s): "LIPASE", "AMYLASE" in the last 168 hours. No results for input(s): "AMMONIA" in the last 168 hours. Coagulation Profile: No results for input(s): "INR", "PROTIME" in the last 168 hours. Cardiac Enzymes: No results for input(s): "CKTOTAL", "CKMB", "CKMBINDEX", "TROPONINI" in the last 168 hours. BNP (last 3 results) No results for input(s): "PROBNP" in the last 8760 hours. HbA1C: Recent Labs    12/10/21 1457  HGBA1C 5.4   CBG: Recent Labs  Lab 12/11/21 0800  GLUCAP 104*   Lipid Profile: No results for input(s): "CHOL", "HDL", "LDLCALC", "TRIG", "CHOLHDL", "LDLDIRECT" in the last 72 hours. Thyroid Function Tests: No results for input(s): "TSH", "T4TOTAL", "FREET4", "T3FREE", "THYROIDAB" in the  last 72 hours. Anemia Panel: No results for input(s): "VITAMINB12", "FOLATE", "FERRITIN", "TIBC", "IRON", "RETICCTPCT" in the last 72 hours. Sepsis Labs: Recent Labs  Lab 12/10/21 0709 12/10/21 1457 12/11/21 0842  PROCALCITON  --   --  <0.10  LATICACIDVEN 0.9 1.5  --     Recent Results (from the past 240 hour(s))  Aerobic/Anaerobic Culture w Gram Stain (surgical/deep wound)     Status: None (Preliminary result)   Collection Time: 12/09/21 10:30 AM   Specimen: Wound  Result Value Ref Range Status   Specimen Description   Final    WOUND Performed at [facility], [address], [place], [place] [address]    Special Requests   Final    LEFT FOOT Performed at [facility], [address] [place], [place] [address]    Gram Stain   Final    RARE WBC PRESENT,BOTH PMN AND MONONUCLEAR FEW GRAM POSITIVE COCCI    Culture   Final    CULTURE REINCUBATED FOR BETTER GROWTH RARE PROTEUS MIRABILIS SUSCEPTIBILITIES TO FOLLOW Performed at [facility], [place] [address], [place], [place] [address]    Report Status PENDING  Incomplete  Surgical pcr screen     Status: Abnormal   Collection Time: 12/10/21  3:32 PM   Specimen: Nasal Mucosa; Nasal Swab  Result Value Ref Range Status   MRSA, PCR NEGATIVE NEGATIVE Final   Staphylococcus aureus POSITIVE (A) NEGATIVE Final    Comment: (NOTE) The Xpert SA Assay (FDA approved for NASAL specimens in patients 60 years of age and older), is one component of a comprehensive surveillance program. It is not intended to diagnose infection nor to guide or monitor treatment. Performed at [facility], [address] [place], [place] [address]          Radiology Studies: DG Foot Complete Left  Result Date: 12/10/2021 CLINICAL DATA:  Ulceration and pain to plantar surface. Recent amputation. EXAM: LEFT FOOT - COMPLETE 3+ VIEW COMPARISON:  Foot radiographs 10/19/2021 FINDINGS: The patient is status post  amputation of the second through fourth toes, new since 10/19/2021. Amputation of the great toe and fifth ray from the distal aspect of the metatarsal There is soft tissue lucency centered around the heads of the third and fourth metatarsals likely corresponding to the reported ulceration. There is cortical irregularity with erosion along the distal metaphysis of the third metatarsal raising concern for osteomyelitis. No other definite osseous erosion is seen. There is marked soft tissue swelling about the distal end of the foot. There is inferior calcaneal spurring. IMPRESSION: Interval resection of second through fourth toes with soft tissue lucency surrounding the heads of the third and fourth metatarsals likely corresponding to the reported ulceration. There is cortical irregularity with erosive change at the distal metaphysis of third metatarsal raising concern for osteomyelitis. Electronically Signed   By: Valetta Mole M.D.   On: 12/10/2021 08:07        Scheduled Meds:  amLODipine  5 mg Oral Daily   ascorbic acid  500 mg Oral Daily   atorvastatin  40 mg Oral QHS   buPROPion  150 mg Oral Daily   carvedilol  3.125 mg Oral BID WC   Chlorhexidine Gluconate Cloth  6 each Topical Q0600   vitamin D3  2,000 Units Oral Daily   DULoxetine  60 mg Oral Daily   levothyroxine  75 mcg Oral Q0600   lisdexamfetamine  40 mg Oral q morning   metaxalone  1,600 mg Oral TID   mupirocin ointment  1 Application Nasal BID   sodium chloride flush  3 mL Intravenous Q12H   zinc sulfate  220 mg Oral Daily   Continuous Infusions:  sodium chloride     cefTRIAXone (ROCEPHIN)  IV     And   metronidazole 500 mg (12/11/21 1043)     LOS: 1 day       Sidney Ace, MD [facility]   If 7PM-7AM, please contact night-coverage  12/11/2021, 12:01 PM

## 2021-12-11 NOTE — Progress Notes (Signed)
Subjective/Chief Complaint: Patient seen.  No specific complaints.   Objective: Vital signs in last 24 hours: Temp:  [97.8 F (36.6 C)-98.1 F (36.7 C)] 98 F (36.7 C) (08/24 0328) Pulse Rate:  [58-65] 58 (08/24 0328) Resp:  [17] 17 (08/24 0328) BP: (124-129)/(67-81) 124/81 (08/24 0328) SpO2:  [97 %-98 %] 97 % (08/24 0328) Last BM Date : 12/10/21  Intake/Output from previous day: 08/23 0701 - 08/24 0700 In: 880 [P.O.:480; IV Piggyback:400] Out: 1375 [Urine:1375] Intake/Output this shift: Total I/O In: 240 [P.O.:240] Out: -   Moderate to heavy drainage noted from the wound on the left foot.  Some edema in the forefoot but no specific cellulitis.   Lab Results:  Recent Labs    12/10/21 0709 12/11/21 0425  WBC 6.9 5.6  HGB 10.2* 9.9*  HCT 32.1* 31.6*  PLT 471* 433*   BMET Recent Labs    12/10/21 0709 12/11/21 0425  NA 140 142  K 3.9 4.1  CL 108 111  CO2 27 28  GLUCOSE 129* 111*  BUN 22 16  CREATININE 1.01 0.94  CALCIUM 8.7* 8.7*   PT/INR No results for input(s): "LABPROT", "INR" in the last 72 hours. ABG No results for input(s): "PHART", "HCO3" in the last 72 hours.  Invalid input(s): "PCO2", "PO2"  Studies/Results: DG Foot Complete Left  Result Date: 12/10/2021 CLINICAL DATA:  Ulceration and pain to plantar surface. Recent amputation. EXAM: LEFT FOOT - COMPLETE 3+ VIEW COMPARISON:  Foot radiographs 10/19/2021 FINDINGS: The patient is status post amputation of the second through fourth toes, new since 10/19/2021. Amputation of the great toe and fifth ray from the distal aspect of the metatarsal There is soft tissue lucency centered around the heads of the third and fourth metatarsals likely corresponding to the reported ulceration. There is cortical irregularity with erosion along the distal metaphysis of the third metatarsal raising concern for osteomyelitis. No other definite osseous erosion is seen. There is marked soft tissue swelling about the  distal end of the foot. There is inferior calcaneal spurring. IMPRESSION: Interval resection of second through fourth toes with soft tissue lucency surrounding the heads of the third and fourth metatarsals likely corresponding to the reported ulceration. There is cortical irregularity with erosive change at the distal metaphysis of third metatarsal raising concern for osteomyelitis. Electronically Signed   By: Valetta Mole M.D.   On: 12/10/2021 08:07    Anti-infectives: Anti-infectives (From admission, onward)    Start     Dose/Rate Route Frequency Ordered Stop   12/10/21 1600  cefTRIAXone (ROCEPHIN) 2 g in sodium chloride 0.9 % 100 mL IVPB       See Hyperspace for full Linked Orders Report.   2 g 200 mL/hr over 30 Minutes Intravenous Every 24 hours 12/10/21 0958 12/17/21 1559   12/10/21 1000  metroNIDAZOLE (FLAGYL) IVPB 500 mg       See Hyperspace for full Linked Orders Report.   500 mg 100 mL/hr over 60 Minutes Intravenous Every 12 hours 12/10/21 0958 12/17/21 0959   12/10/21 0745  ceFEPIme (MAXIPIME) 2 g in sodium chloride 0.9 % 100 mL IVPB        2 g 200 mL/hr over 30 Minutes Intravenous  Once 12/10/21 0737 12/10/21 0816   12/10/21 0745  vancomycin (VANCOCIN) IVPB 1000 mg/200 mL premix        1,000 mg 200 mL/hr over 60 Minutes Intravenous  Once 12/10/21 0737 12/10/21 1607       Assessment/Plan: s/p Procedure(s): TRANSMETATARSAL AMPUTATION (  Left) Assessment: Osteomyelitis left forefoot.  Plan: Betadine wet-to-dry gauze dressing reapplied to the left foot.  Discussed with the patient surgery planned for tomorrow for transmetatarsal amputation of the left foot.  Discussed possible risks and complications of the procedure including but not limited to inability of the wound to heal due to extent of infection or due to his diabetes or peripheral vascular status.  No guarantees could be given as to the outcome.  Questions invited and answered.  We will obtain consent for transmetatarsal  amputation left foot.  Patient will be n.p.o. after midnight.  Plan for surgery tomorrow morning.  LOS: 1 day    Durward Fortes 12/11/2021

## 2021-12-11 NOTE — H&P (View-Only) (Signed)
Subjective/Chief Complaint: Patient seen.  No specific complaints.   Objective: Vital signs in last 24 hours: Temp:  [97.8 F (36.6 C)-98.1 F (36.7 C)] 98 F (36.7 C) (08/24 0328) Pulse Rate:  [58-65] 58 (08/24 0328) Resp:  [17] 17 (08/24 0328) BP: (124-129)/(67-81) 124/81 (08/24 0328) SpO2:  [97 %-98 %] 97 % (08/24 0328) Last BM Date : 12/10/21  Intake/Output from previous day: 08/23 0701 - 08/24 0700 In: 880 [P.O.:480; IV Piggyback:400] Out: 1375 [Urine:1375] Intake/Output this shift: Total I/O In: 240 [P.O.:240] Out: -   Moderate to heavy drainage noted from the wound on the left foot.  Some edema in the forefoot but no specific cellulitis.   Lab Results:  Recent Labs    12/10/21 0709 12/11/21 0425  WBC 6.9 5.6  HGB 10.2* 9.9*  HCT 32.1* 31.6*  PLT 471* 433*   BMET Recent Labs    12/10/21 0709 12/11/21 0425  NA 140 142  K 3.9 4.1  CL 108 111  CO2 27 28  GLUCOSE 129* 111*  BUN 22 16  CREATININE 1.01 0.94  CALCIUM 8.7* 8.7*   PT/INR No results for input(s): "LABPROT", "INR" in the last 72 hours. ABG No results for input(s): "PHART", "HCO3" in the last 72 hours.  Invalid input(s): "PCO2", "PO2"  Studies/Results: DG Foot Complete Left  Result Date: 12/10/2021 CLINICAL DATA:  Ulceration and pain to plantar surface. Recent amputation. EXAM: LEFT FOOT - COMPLETE 3+ VIEW COMPARISON:  Foot radiographs 10/19/2021 FINDINGS: The patient is status post amputation of the second through fourth toes, new since 10/19/2021. Amputation of the great toe and fifth ray from the distal aspect of the metatarsal There is soft tissue lucency centered around the heads of the third and fourth metatarsals likely corresponding to the reported ulceration. There is cortical irregularity with erosion along the distal metaphysis of the third metatarsal raising concern for osteomyelitis. No other definite osseous erosion is seen. There is marked soft tissue swelling about the  distal end of the foot. There is inferior calcaneal spurring. IMPRESSION: Interval resection of second through fourth toes with soft tissue lucency surrounding the heads of the third and fourth metatarsals likely corresponding to the reported ulceration. There is cortical irregularity with erosive change at the distal metaphysis of third metatarsal raising concern for osteomyelitis. Electronically Signed   By: Valetta Mole M.D.   On: 12/10/2021 08:07    Anti-infectives: Anti-infectives (From admission, onward)    Start     Dose/Rate Route Frequency Ordered Stop   12/10/21 1600  cefTRIAXone (ROCEPHIN) 2 g in sodium chloride 0.9 % 100 mL IVPB       See Hyperspace for full Linked Orders Report.   2 g 200 mL/hr over 30 Minutes Intravenous Every 24 hours 12/10/21 0958 12/17/21 1559   12/10/21 1000  metroNIDAZOLE (FLAGYL) IVPB 500 mg       See Hyperspace for full Linked Orders Report.   500 mg 100 mL/hr over 60 Minutes Intravenous Every 12 hours 12/10/21 0958 12/17/21 0959   12/10/21 0745  ceFEPIme (MAXIPIME) 2 g in sodium chloride 0.9 % 100 mL IVPB        2 g 200 mL/hr over 30 Minutes Intravenous  Once 12/10/21 0737 12/10/21 0816   12/10/21 0745  vancomycin (VANCOCIN) IVPB 1000 mg/200 mL premix        1,000 mg 200 mL/hr over 60 Minutes Intravenous  Once 12/10/21 0737 12/10/21 3536       Assessment/Plan: s/p Procedure(s): TRANSMETATARSAL AMPUTATION (  Left) Assessment: Osteomyelitis left forefoot.  Plan: Betadine wet-to-dry gauze dressing reapplied to the left foot.  Discussed with the patient surgery planned for tomorrow for transmetatarsal amputation of the left foot.  Discussed possible risks and complications of the procedure including but not limited to inability of the wound to heal due to extent of infection or due to his diabetes or peripheral vascular status.  No guarantees could be given as to the outcome.  Questions invited and answered.  We will obtain consent for transmetatarsal  amputation left foot.  Patient will be n.p.o. after midnight.  Plan for surgery tomorrow morning.  LOS: 1 day    Durward Fortes 12/11/2021

## 2021-12-12 ENCOUNTER — Encounter
Admission: EM | Disposition: A | Discharge status: Home Health | Payer: Self-pay | Source: Home / Self Care | Attending: Internal Medicine

## 2021-12-12 ENCOUNTER — Inpatient Hospital Stay: Payer: Managed Care, Other (non HMO) | Admitting: Certified Registered Nurse Anesthetist

## 2021-12-12 ENCOUNTER — Inpatient Hospital Stay: Payer: Managed Care, Other (non HMO)

## 2021-12-12 ENCOUNTER — Other Ambulatory Visit: Payer: Self-pay

## 2021-12-12 ENCOUNTER — Encounter: Payer: Self-pay | Admitting: Internal Medicine

## 2021-12-12 DIAGNOSIS — M86172 Other acute osteomyelitis, left ankle and foot: Secondary | ICD-10-CM | POA: Diagnosis not present

## 2021-12-12 HISTORY — PX: TRANSMETATARSAL AMPUTATION: SHX6197

## 2021-12-12 SURGERY — AMPUTATION, FOOT, TRANSMETATARSAL
Anesthesia: General | Site: Toe | Laterality: Left

## 2021-12-12 MED ORDER — EPHEDRINE 5 MG/ML INJ
INTRAVENOUS | Status: AC
  Filled 2021-12-12: qty 5

## 2021-12-12 MED ORDER — LIDOCAINE HCL (PF) 1 % IJ SOLN
INTRAMUSCULAR | Status: DC | PRN
  Administered 2021-12-12: 2 mL via SUBCUTANEOUS

## 2021-12-12 MED ORDER — MORPHINE SULFATE (PF) 2 MG/ML IV SOLN
2.0000 mg | INTRAVENOUS | Status: DC | PRN
  Administered 2021-12-12 – 2021-12-14 (×5): 2 mg via INTRAVENOUS
  Filled 2021-12-12 (×5): qty 1

## 2021-12-12 MED ORDER — HYDROMORPHONE HCL 1 MG/ML IJ SOLN
0.5000 mg | INTRAMUSCULAR | Status: DC | PRN
  Administered 2021-12-12 (×3): 0.5 mg via INTRAVENOUS

## 2021-12-12 MED ORDER — SUGAMMADEX SODIUM 200 MG/2ML IV SOLN
INTRAVENOUS | Status: DC | PRN
  Administered 2021-12-12: 227.8 mg via INTRAVENOUS

## 2021-12-12 MED ORDER — MIDAZOLAM HCL 2 MG/2ML IJ SOLN
1.0000 mg | INTRAMUSCULAR | Status: AC
  Administered 2021-12-12: 1 mg via INTRAVENOUS

## 2021-12-12 MED ORDER — BUPIVACAINE HCL (PF) 0.5 % IJ SOLN
INTRAMUSCULAR | Status: AC
  Filled 2021-12-12: qty 10

## 2021-12-12 MED ORDER — PROPOFOL 10 MG/ML IV BOLUS
INTRAVENOUS | Status: DC | PRN
  Administered 2021-12-12: 150 mg via INTRAVENOUS
  Administered 2021-12-12: 50 mg via INTRAVENOUS

## 2021-12-12 MED ORDER — OXYCODONE HCL 5 MG PO TABS
5.0000 mg | ORAL_TABLET | ORAL | Status: AC
  Administered 2021-12-12: 5 mg via ORAL

## 2021-12-12 MED ORDER — GLYCOPYRROLATE 0.2 MG/ML IJ SOLN
INTRAMUSCULAR | Status: AC
  Filled 2021-12-12: qty 1

## 2021-12-12 MED ORDER — SODIUM CHLORIDE 0.9 % IV SOLN
3.0000 g | Freq: Four times a day (QID) | INTRAVENOUS | Status: DC
  Administered 2021-12-12 – 2021-12-15 (×11): 3 g via INTRAVENOUS
  Filled 2021-12-12 (×6): qty 8
  Filled 2021-12-12: qty 3
  Filled 2021-12-12: qty 8
  Filled 2021-12-12: qty 3
  Filled 2021-12-12: qty 8
  Filled 2021-12-12: qty 3
  Filled 2021-12-12 (×2): qty 8
  Filled 2021-12-12: qty 3

## 2021-12-12 MED ORDER — 0.9 % SODIUM CHLORIDE (POUR BTL) OPTIME
TOPICAL | Status: DC | PRN
  Administered 2021-12-12: 500 mL

## 2021-12-12 MED ORDER — PROPOFOL 1000 MG/100ML IV EMUL
INTRAVENOUS | Status: AC
  Filled 2021-12-12: qty 100

## 2021-12-12 MED ORDER — MIDAZOLAM HCL 2 MG/2ML IJ SOLN
INTRAMUSCULAR | Status: AC
  Filled 2021-12-12: qty 2

## 2021-12-12 MED ORDER — BUPIVACAINE HCL (PF) 0.5 % IJ SOLN
INTRAMUSCULAR | Status: DC | PRN
  Administered 2021-12-12: 30 mL via PERINEURAL

## 2021-12-12 MED ORDER — FENTANYL CITRATE (PF) 100 MCG/2ML IJ SOLN
25.0000 ug | INTRAMUSCULAR | Status: DC | PRN
  Administered 2021-12-12 (×3): 25 ug via INTRAVENOUS

## 2021-12-12 MED ORDER — FENTANYL CITRATE (PF) 100 MCG/2ML IJ SOLN
INTRAMUSCULAR | Status: AC
  Administered 2021-12-12: 25 ug via INTRAVENOUS
  Filled 2021-12-12: qty 2

## 2021-12-12 MED ORDER — FENTANYL CITRATE (PF) 100 MCG/2ML IJ SOLN
INTRAMUSCULAR | Status: AC
  Filled 2021-12-12: qty 2

## 2021-12-12 MED ORDER — ROCURONIUM BROMIDE 100 MG/10ML IV SOLN
INTRAVENOUS | Status: DC | PRN
  Administered 2021-12-12: 40 mg via INTRAVENOUS

## 2021-12-12 MED ORDER — ACETAMINOPHEN 10 MG/ML IV SOLN
INTRAVENOUS | Status: DC | PRN
  Administered 2021-12-12: 1000 mg via INTRAVENOUS

## 2021-12-12 MED ORDER — ONDANSETRON HCL 4 MG/2ML IJ SOLN
INTRAMUSCULAR | Status: DC | PRN
  Administered 2021-12-12: 4 mg via INTRAVENOUS

## 2021-12-12 MED ORDER — ONDANSETRON HCL 4 MG/2ML IJ SOLN
INTRAMUSCULAR | Status: AC
  Filled 2021-12-12: qty 2

## 2021-12-12 MED ORDER — BUPIVACAINE HCL (PF) 0.5 % IJ SOLN
INTRAMUSCULAR | Status: AC
  Filled 2021-12-12: qty 30

## 2021-12-12 MED ORDER — ROPIVACAINE HCL 5 MG/ML IJ SOLN
INTRAMUSCULAR | Status: AC
  Filled 2021-12-12: qty 30

## 2021-12-12 MED ORDER — EPHEDRINE SULFATE (PRESSORS) 50 MG/ML IJ SOLN
INTRAMUSCULAR | Status: DC | PRN
  Administered 2021-12-12: 5 mg via INTRAVENOUS
  Administered 2021-12-12: 10 mg via INTRAVENOUS
  Administered 2021-12-12 (×2): 5 mg via INTRAVENOUS

## 2021-12-12 MED ORDER — MIDAZOLAM HCL 2 MG/2ML IJ SOLN
INTRAMUSCULAR | Status: DC | PRN
  Administered 2021-12-12 (×2): 1 mg via INTRAVENOUS

## 2021-12-12 MED ORDER — SUCCINYLCHOLINE CHLORIDE 200 MG/10ML IV SOSY
PREFILLED_SYRINGE | INTRAVENOUS | Status: DC | PRN
  Administered 2021-12-12: 120 mg via INTRAVENOUS

## 2021-12-12 MED ORDER — VANCOMYCIN HCL 1000 MG IV SOLR
INTRAVENOUS | Status: AC
  Filled 2021-12-12: qty 40

## 2021-12-12 MED ORDER — SUCCINYLCHOLINE CHLORIDE 200 MG/10ML IV SOSY
PREFILLED_SYRINGE | INTRAVENOUS | Status: AC
  Filled 2021-12-12: qty 10

## 2021-12-12 MED ORDER — LIDOCAINE HCL (PF) 1 % IJ SOLN
INTRAMUSCULAR | Status: AC
  Filled 2021-12-12: qty 5

## 2021-12-12 MED ORDER — DEXAMETHASONE SODIUM PHOSPHATE 10 MG/ML IJ SOLN
INTRAMUSCULAR | Status: DC | PRN
  Administered 2021-12-12: 4 mg via INTRAVENOUS

## 2021-12-12 MED ORDER — KETOROLAC TROMETHAMINE 30 MG/ML IJ SOLN
INTRAMUSCULAR | Status: AC
  Administered 2021-12-12: 30 mg via INTRAVENOUS
  Filled 2021-12-12: qty 1

## 2021-12-12 MED ORDER — VANCOMYCIN HCL 1000 MG IV SOLR
INTRAVENOUS | Status: DC | PRN
  Administered 2021-12-12: 1000 mg

## 2021-12-12 MED ORDER — HYDROMORPHONE HCL 1 MG/ML IJ SOLN
INTRAMUSCULAR | Status: AC
  Filled 2021-12-12: qty 1

## 2021-12-12 MED ORDER — FENTANYL CITRATE (PF) 100 MCG/2ML IJ SOLN
INTRAMUSCULAR | Status: DC | PRN
  Administered 2021-12-12 (×4): 50 ug via INTRAVENOUS

## 2021-12-12 MED ORDER — OXYCODONE HCL 5 MG PO TABS
ORAL_TABLET | ORAL | Status: AC
  Filled 2021-12-12: qty 1

## 2021-12-12 MED ORDER — MORPHINE SULFATE (PF) 4 MG/ML IV SOLN
4.0000 mg | INTRAVENOUS | Status: DC | PRN
  Administered 2021-12-13: 4 mg via INTRAVENOUS
  Filled 2021-12-12: qty 1

## 2021-12-12 MED ORDER — DEXAMETHASONE SODIUM PHOSPHATE 10 MG/ML IJ SOLN
INTRAMUSCULAR | Status: AC
  Filled 2021-12-12: qty 1

## 2021-12-12 MED ORDER — LIDOCAINE HCL (PF) 2 % IJ SOLN
INTRAMUSCULAR | Status: AC
  Filled 2021-12-12: qty 5

## 2021-12-12 MED ORDER — GLYCOPYRROLATE 0.2 MG/ML IJ SOLN
INTRAMUSCULAR | Status: DC | PRN
  Administered 2021-12-12: .2 mg via INTRAVENOUS

## 2021-12-12 MED ORDER — VANCOMYCIN HCL 1000 MG IV SOLR
INTRAVENOUS | Status: AC
  Filled 2021-12-12: qty 20

## 2021-12-12 MED ORDER — ONDANSETRON HCL 4 MG/2ML IJ SOLN: 4.0000 mg | Freq: Once | INTRAMUSCULAR | Status: DC | PRN

## 2021-12-12 MED ORDER — KETOROLAC TROMETHAMINE 30 MG/ML IJ SOLN: 30.0000 mg | INTRAMUSCULAR | Status: AC

## 2021-12-12 MED ORDER — ACETAMINOPHEN 10 MG/ML IV SOLN
INTRAVENOUS | Status: AC
  Filled 2021-12-12: qty 100

## 2021-12-12 MED ORDER — PROPOFOL 10 MG/ML IV BOLUS
INTRAVENOUS | Status: AC
  Filled 2021-12-12: qty 20

## 2021-12-12 MED ORDER — LIDOCAINE HCL (CARDIAC) PF 100 MG/5ML IV SOSY
PREFILLED_SYRINGE | INTRAVENOUS | Status: DC | PRN
  Administered 2021-12-12: 80 mg via INTRAVENOUS

## 2021-12-12 MED ORDER — LACTATED RINGERS IV SOLN: INTRAVENOUS | Status: DC | PRN

## 2021-12-12 SURGICAL SUPPLY — 51 items
BLADE MED AGGRESSIVE (BLADE) ×1 IMPLANT
BLADE SURG 15 STRL LF DISP TIS (BLADE) IMPLANT
BLADE SURG 15 STRL SS (BLADE) ×3
BNDG COHESIVE 4X5 TAN STRL LF (GAUZE/BANDAGES/DRESSINGS) ×1 IMPLANT
BNDG ELASTIC 4X5.8 VLCR NS LF (GAUZE/BANDAGES/DRESSINGS) ×1 IMPLANT
BNDG ESMARK 4X12 TAN STRL LF (GAUZE/BANDAGES/DRESSINGS) ×1 IMPLANT
BNDG GAUZE DERMACEA FLUFF (GAUZE/BANDAGES/DRESSINGS) ×1
BNDG GAUZE DERMACEA FLUFF 4 (GAUZE/BANDAGES/DRESSINGS) ×1 IMPLANT
CUFF TOURN SGL QUICK 12 (TOURNIQUET CUFF) IMPLANT
CUFF TOURN SGL QUICK 18X4 (TOURNIQUET CUFF) IMPLANT
DRAIN PENROSE 12X.25 LTX STRL (MISCELLANEOUS) ×1 IMPLANT
DRAPE FLUOR MINI C-ARM 54X84 (DRAPES) ×1 IMPLANT
DRSG GAUZE FLUFF 36X18 (GAUZE/BANDAGES/DRESSINGS) ×1 IMPLANT
DURAPREP 26ML APPLICATOR (WOUND CARE) ×1 IMPLANT
ELECT REM PT RETURN 9FT ADLT (ELECTROSURGICAL) ×1
ELECTRODE REM PT RTRN 9FT ADLT (ELECTROSURGICAL) ×1 IMPLANT
GAUZE SPONGE 4X4 12PLY STRL (GAUZE/BANDAGES/DRESSINGS) ×1 IMPLANT
GAUZE XEROFORM 1X8 LF (GAUZE/BANDAGES/DRESSINGS) ×1 IMPLANT
GLOVE BIO SURGEON STRL SZ7.5 (GLOVE) ×1 IMPLANT
GLOVE SURG UNDER LTX SZ8 (GLOVE) ×1 IMPLANT
GOWN STRL REUS W/ TWL LRG LVL3 (GOWN DISPOSABLE) ×2 IMPLANT
GOWN STRL REUS W/TWL LRG LVL3 (GOWN DISPOSABLE) ×2
HANDLE YANKAUER SUCT BULB TIP (MISCELLANEOUS) ×1 IMPLANT
HANDPIECE VERSAJET DEBRIDEMENT (MISCELLANEOUS) ×1 IMPLANT
IV NS IRRIG 3000ML ARTHROMATIC (IV SOLUTION) ×1 IMPLANT
KIT STIMULAN RAPID CURE 5CC (Orthopedic Implant) IMPLANT
KIT TURNOVER KIT A (KITS) ×1 IMPLANT
LABEL OR SOLS (LABEL) ×1 IMPLANT
MANIFOLD NEPTUNE II (INSTRUMENTS) ×1 IMPLANT
NDL SAFETY ECLIPSE 18X1.5 (NEEDLE) ×1 IMPLANT
NEEDLE HYPO 18GX1.5 SHARP (NEEDLE) ×1
NS IRRIG 500ML POUR BTL (IV SOLUTION) ×1 IMPLANT
PACK EXTREMITY ARMC (MISCELLANEOUS) ×1 IMPLANT
PAD ABD DERMACEA PRESS 5X9 (GAUZE/BANDAGES/DRESSINGS) ×1 IMPLANT
SOL PREP PVP 2OZ (MISCELLANEOUS) ×1
SOLUTION PREP PVP 2OZ (MISCELLANEOUS) ×1 IMPLANT
SPONGE T-LAP 18X18 ~~LOC~~+RFID (SPONGE) ×1 IMPLANT
STOCKINETTE STRL 6IN 960660 (GAUZE/BANDAGES/DRESSINGS) ×1 IMPLANT
STRAP SAFETY 5IN WIDE (MISCELLANEOUS) ×1 IMPLANT
SUT ETHILON 4-0 (SUTURE) ×2
SUT ETHILON 4-0 FS2 18XMFL BLK (SUTURE) ×2
SUT VIC AB 2-0 SH 27 (SUTURE) ×1
SUT VIC AB 2-0 SH 27XBRD (SUTURE) IMPLANT
SUT VIC AB 3-0 SH 27 (SUTURE)
SUT VIC AB 3-0 SH 27X BRD (SUTURE) IMPLANT
SUT VIC AB 4-0 FS2 27 (SUTURE) ×2 IMPLANT
SUT VICRYL 3-0 27IN (SUTURE) IMPLANT
SUTURE ETHLN 4-0 FS2 18XMF BLK (SUTURE) ×2 IMPLANT
SYR 10ML LL (SYRINGE) ×2 IMPLANT
TRAP FLUID SMOKE EVACUATOR (MISCELLANEOUS) ×1 IMPLANT
WATER STERILE IRR 500ML POUR (IV SOLUTION) ×1 IMPLANT

## 2021-12-12 NOTE — Anesthesia Preprocedure Evaluation (Signed)
Anesthesia Evaluation  Patient identified by MRN, date of birth, ID band Patient awake    Reviewed: Allergy & Precautions, H&P , NPO status , Patient's Chart, lab work & pertinent test results, reviewed documented beta blocker date and time   Airway Mallampati: II  TM Distance: >3 FB Neck ROM: full    Dental  (+) Teeth Intact   Pulmonary shortness of breath and with exertion, asthma , sleep apnea , pneumonia, resolved,    Pulmonary exam normal        Cardiovascular Exercise Tolerance: Poor hypertension, On Medications + angina with exertion + CAD, + Past MI and + Cardiac Stents  Normal cardiovascular exam Rate:Normal     Neuro/Psych Anxiety Depression  Neuromuscular disease negative psych ROS   GI/Hepatic Neg liver ROS, PUD, GERD  Medicated,  Endo/Other  diabetesHypothyroidism   Renal/GU Renal disease  negative genitourinary   Musculoskeletal   Abdominal   Peds  Hematology  (+) Blood dyscrasia, anemia ,   Anesthesia Other Findings   Reproductive/Obstetrics negative OB ROS                             Anesthesia Physical Anesthesia Plan  ASA: 3  Anesthesia Plan: General ETT   Post-op Pain Management:    Induction:   PONV Risk Score and Plan: 3  Airway Management Planned:   Additional Equipment:   Intra-op Plan:   Post-operative Plan:   Informed Consent: I have reviewed the patients History and Physical, chart, labs and discussed the procedure including the risks, benefits and alternatives for the proposed anesthesia with the patient or authorized representative who has indicated his/her understanding and acceptance.       Plan Discussed with: CRNA  Anesthesia Plan Comments:         Anesthesia Quick Evaluation

## 2021-12-12 NOTE — Transfer of Care (Signed)
Immediate Anesthesia Transfer of Care Note  Patient: CHESTON COURY  Procedure(s) Performed: TRANSMETATARSAL AMPUTATION (Left: Toe)  Patient Location: PACU  Anesthesia Type:General  Level of Consciousness: awake, alert  and oriented  Airway & Oxygen Therapy: Patient Spontanous Breathing and Patient connected to face mask oxygen  Post-op Assessment: Report given to RN and Post -op Vital signs reviewed and stable  Post vital signs: Reviewed and stable  Last Vitals:  Vitals Value Taken Time  BP 134/74 12/12/21 0900  Temp 36.5 C 12/12/21 0900  Pulse 75 12/12/21 0902  Resp 16 12/12/21 0902  SpO2 98 % 12/12/21 0902  Vitals shown include unvalidated device data.  Last Pain:  Vitals:   12/12/21 0900  TempSrc:   PainSc: 10-Worst pain ever      Patients Stated Pain Goal: 0 (16/10/96 0454)  Complications: No notable events documented.

## 2021-12-12 NOTE — Interval H&P Note (Signed)
History and Physical Interval Note:  12/12/2021 7:03 AM  Charles [name]  has presented today for surgery, with the diagnosis of Tranmetatarsal Left Foot.  The various methods of treatment have been discussed with the patient and family. After consideration of risks, benefits and other options for treatment, the patient has consented to  Procedure(s): TRANSMETATARSAL AMPUTATION (Left) as a surgical intervention.  The patient's history has been reviewed, patient examined, no change in status, stable for surgery.  I have reviewed the patient's chart and labs.  Questions were answered to the patient's satisfaction.     Durward Fortes

## 2021-12-12 NOTE — Progress Notes (Signed)
When pt arrived to pacu pt was coming off bed in pain.  Dr Andree Elk notified as well as dr cline aware of pt level of pain.  Attempted to call wife multiple times at all listed numbers and daughter X 3.  Daughter returned call and both her and pt agreed to post op block for pain.  Pt reports still having pain after block but it is tolerable and better than before it.  Pt appears much more comfortable after block than when arrived to pacu.

## 2021-12-12 NOTE — Anesthesia Procedure Notes (Signed)
Procedure Name: Intubation Date/Time: 12/12/2021 7:42 AM  Performed by: Demetrius Charity, CRNAPre-anesthesia Checklist: Patient identified, Patient being monitored, Timeout performed, Emergency Drugs available and Suction available Patient Re-evaluated:Patient Re-evaluated prior to induction Oxygen Delivery Method: Circle system utilized Preoxygenation: Pre-oxygenation with 100% oxygen Induction Type: IV induction Ventilation: Mask ventilation without difficulty Laryngoscope Size: McGraph and 4 Grade View: Grade II Tube type: Oral Tube size: 7.0 mm Number of attempts: 1 Airway Equipment and Method: Stylet and Video-laryngoscopy Placement Confirmation: ETT inserted through vocal cords under direct vision, positive ETCO2 and breath sounds checked- equal and bilateral Secured at: 22 cm Tube secured with: Tape Dental Injury: Teeth and Oropharynx as per pre-operative assessment

## 2021-12-12 NOTE — Plan of Care (Signed)
  Problem: Education: Goal: Knowledge of General Education information will improve Description: Including pain rating scale, medication(s)/side effects and non-pharmacologic comfort measures 12/12/2021 1147 by Jarmal Lewelling Bet, LPN Outcome: Progressing 12/12/2021 1147 by Timmia Cogburn Bet, LPN Outcome: Progressing   Problem: Health Behavior/Discharge Planning: Goal: Ability to manage health-related needs will improve 12/12/2021 1147 by Clell Trahan Bet, LPN Outcome: Progressing 12/12/2021 1147 by Markia Kyer Bet, LPN Outcome: Progressing   Problem: Clinical Measurements: Goal: Ability to maintain clinical measurements within normal limits will improve 12/12/2021 1147 by Rasheedah Reis Bet, LPN Outcome: Progressing 12/12/2021 1147 by Jezreel Justiniano Bet, LPN Outcome: Progressing Goal: Will remain free from infection 12/12/2021 1147 by Yuma Blucher Bet, LPN Outcome: Progressing 12/12/2021 1147 by Trelon Plush Bet, LPN Outcome: Progressing Goal: Diagnostic test results will improve 12/12/2021 1147 by Zavien Clubb Bet, LPN Outcome: Progressing 12/12/2021 1147 by Gurbani Figge Bet, LPN Outcome: Progressing Goal: Respiratory complications will improve 12/12/2021 1147 by Isaak Delmundo Bet, LPN Outcome: Progressing 12/12/2021 1147 by Rex Oesterle Bet, LPN Outcome: Progressing Goal: Cardiovascular complication will be avoided 12/12/2021 1147 by Kemiyah Tarazon Bet, LPN Outcome: Progressing 12/12/2021 1147 by Chakira Jachim Bet, LPN Outcome: Progressing   Problem: Activity: Goal: Risk for activity intolerance will decrease 12/12/2021 1147 by Harnoor Kohles Bet, LPN Outcome: Progressing 12/12/2021 1147 by Maxyne Derocher Bet, LPN Outcome: Progressing   Problem: Nutrition: Goal: Adequate nutrition will be maintained 12/12/2021 1147 by Breonna Gafford Bet, LPN Outcome: Progressing 12/12/2021 1147 by Ziah Turvey Bet, LPN Outcome: Progressing   Problem: Coping: Goal: Level of anxiety will decrease 12/12/2021 1147 by Dannon Nguyenthi Bet, LPN Outcome:  Progressing 12/12/2021 1147 by Bently Morath Bet, LPN Outcome: Progressing   Problem: Elimination: Goal: Will not experience complications related to bowel motility 12/12/2021 1147 by Aradia Estey Bet, LPN Outcome: Progressing 12/12/2021 1147 by Ciaira Natividad Bet, LPN Outcome: Progressing Goal: Will not experience complications related to urinary retention 12/12/2021 1147 by Rhina Kramme Bet, LPN Outcome: Progressing 12/12/2021 1147 by Curry Dulski Bet, LPN Outcome: Progressing   Problem: Pain Managment: Goal: General experience of comfort will improve 12/12/2021 1147 by Kamyia Thomason Bet, LPN Outcome: Progressing 12/12/2021 1147 by Walden Statz Bet, LPN Outcome: Progressing   Problem: Safety: Goal: Ability to remain free from injury will improve 12/12/2021 1147 by Dyon Rotert Bet, LPN Outcome: Progressing 12/12/2021 1147 by Destinae Neubecker Bet, LPN Outcome: Progressing   Problem: Skin Integrity: Goal: Risk for impaired skin integrity will decrease Outcome: Progressing   Problem: Education: Goal: Ability to describe self-care measures that may prevent or decrease complications (Diabetes Survival Skills Education) will improve Outcome: Progressing Goal: Individualized Educational Video(s) Outcome: Progressing   Problem: Coping: Goal: Ability to adjust to condition or change in health will improve Outcome: Progressing   Problem: Fluid Volume: Goal: Ability to maintain a balanced intake and output will improve Outcome: Progressing   Problem: Health Behavior/Discharge Planning: Goal: Ability to identify and utilize available resources and services will improve Outcome: Progressing Goal: Ability to manage health-related needs will improve Outcome: Progressing   Problem: Metabolic: Goal: Ability to maintain appropriate glucose levels will improve Outcome: Progressing   Problem: Nutritional: Goal: Maintenance of adequate nutrition will improve Outcome: Progressing Goal: Progress toward  achieving an optimal weight will improve Outcome: Progressing   Problem: Skin Integrity: Goal: Risk for impaired skin integrity will decrease Outcome: Progressing   Problem: Tissue Perfusion: Goal: Adequacy of tissue perfusion will improve Outcome: Progressing

## 2021-12-12 NOTE — Anesthesia Procedure Notes (Addendum)
Anesthesia Regional Block: Popliteal block   Pre-Anesthetic Checklist: , timeout performed,  Correct Patient, Correct Site, Correct Laterality,  Correct Procedure, Correct Position, site marked,  Risks and benefits discussed,  Surgical consent,  Pre-op evaluation,  At surgeon's request and post-op pain management  Laterality: Left and Lower  Prep: chloraprep       Needles:  Injection technique: Single-shot  Needle Type: Echogenic Stimulator Needle     Needle Length: 10cm  Needle Gauge: 20     Additional Needles:   Procedures:,,,, ultrasound used (permanent image in chart),,    Narrative:  End time: 12/12/2021 9:37 AM  Performed by: Personally  Anesthesiologist: Molli Barrows, MD  Additional Notes: Pt. Identified and accepting of procedure after risks and benefits fully reviewed and questions answered. Procedure requested by surgeon for severe post op pain control. Pt. Post op status but fully awake and alert and able to provide consent as confirmed by myself and nurse. Time out performed and laterality confirmed prior to procedure.  ISNB  performed without difficulty and well tolerated.  Neg IV and SATD.  No pain on injection of Local anesthetic and VSST.

## 2021-12-12 NOTE — Progress Notes (Signed)
PROGRESS NOTE    Charles [name]  UMP:[id number] DOB: [birth year] DOA: 12/10/2021 PCP: Mylinda Latina, PA-C    Brief Narrative:   61 y.o. male with medical history significant for obesity, depression, hypertension, anxiety, hypothyroidism, status post multiple toe amputations in July.,2023 by Dr. Vickki Muff for osteomyelitis with gangrenous changes of left toes.   Patient presents to the ER today for evaluation of  left foot pain and ulceration with purulent drainage . He had a foot x ray done at his podiatrist's office which showed  interval pathological fracture of the distal third metatarsal at the  metatarsal head region.  Osteopenia of the distal second metatarsal is noted with areas of air within the soft tissue of the second metatarsal head.  This consistent with history of ulceration.  He was advised to go to the ER by his podiatrist for further evaluation. He denies having any fever, no chills, no chest pain, no shortness of breath, no abdominal pain, no nausea, no vomiting, no changes in his bowel habits, no urinary symptoms, no blurred vision or focal deficit. Left foot x-ray showed interval resection of second through fourth toes with soft tissue lucency surrounding the heads of the third and fourth metatarsals likely corresponding to the reported ulceration. There is cortical irregularity with erosive change at the distal metaphysis of third metatarsal raising concern for osteomyelitis. Patient will be admitted to the hospital for IV antibiotics and podiatry will be consulted.  Podiatry consulted.  Patient status post transmetatarsal amputation on 8/25   Assessment & Plan:   Principal Problem:   Osteomyelitis of ankle or foot, acute, left (HCC) Active Problems:   Major depression, chronic   Hypothyroidism   Essential hypertension   Obesity (BMI 30-39.9)   Anemia of chronic disease  Osteomyelitis of ankle or foot, acute, left (HCC) Patient was sent to the ER for evaluation  of left foot pain and ulceration of the plantar surface of the left foot with purulent drainage and imaging  is suggestive of osteomyelitis. Patient has a history of neuropathic foot ulcer and has had multiple amputations Not diabetic Status post TMA 8/25 Plan: Advance diet Pain control Continue empiric antibiotics Monitor surgical cultures Monitor vitals and fever curve Podiatry follow-up   Major depression, chronic Continue PTA antidepressant regimen   Hypothyroidism Stable Continue Synthroid   Essential hypertension Blood pressure is stable Continue carvedilol and amlodipine     Anemia of chronic disease H&H is stable Monitor closely during this hospitalization   Obesity (BMI 30-39.9) BMI 40.08 Complicates overall prognosis and care Patient exercise has been discussed with patient in detail  Chronic pain syndrome Home pain regimen reinitiated pharmacy dosing assistance   DVT prophylaxis: SCD Code Status: Full Family Communication: Daughter at bedside 8/25 Disposition Plan: Status is: Inpatient Remains inpatient appropriate because: Acute osteomyelitis on IV antibiotics.  Status post transmetatarsal amputation.  Postoperative wound care, IV antibiotics   Level of care: Med-Surg  Consultants:  Podiatry  Procedures:  None  Antimicrobials: Ceftriaxone Metronidazole   Subjective: Seen examined postoperatively.  Appears fatigued otherwise stable.  Reports improved pain control.  Objective: Vitals:   12/12/21 0950 12/12/21 1000 12/12/21 1002 12/12/21 1038  BP: (!) 141/80  (!) 128/98 (!) 142/70  Pulse: 62  (!) 57 63  Resp: '12  10 20  '$ Temp:  97.7 F (36.5 C)  (!) 97.4 F (36.3 C)  TempSrc:      SpO2: 95% (!) 88% 94% 100%  Weight:      Height:  Intake/Output Summary (Last 24 hours) at 12/12/2021 1312 Last data filed at 12/12/2021 1257 Gross per 24 hour  Intake 1730 ml  Output 1077 ml  Net 653 ml   Filed Weights   12/10/21 0624 12/12/21  0700  Weight: 113.9 kg 113.9 kg    Examination:  General exam: no acute distress Respiratory system: Clear to auscultation. Respiratory effort normal. Cardiovascular system: S1-S2, RRR, no murmurs, no pedal edema Gastrointestinal system: Soft, NT/ND, normal bowel sounds Central nervous system: Alert and oriented. No focal neurological deficits. Extremities: Left foot in surgical wraps, not removed, dressing CDISkin: No rashes, lesions or ulcers Psychiatry: Judgement and insight appear normal. Mood & affect appropriate.     Data Reviewed: I have personally reviewed following labs and imaging studies  CBC: Recent Labs  Lab 12/10/21 0709 12/11/21 0425  WBC 6.9 5.6  NEUTROABS 3.7  --   HGB 10.2* 9.9*  HCT 32.1* 31.6*  MCV 89.2 90.5  PLT 471* 496*   Basic Metabolic Panel: Recent Labs  Lab 12/10/21 0709 12/11/21 0425  NA 140 142  K 3.9 4.1  CL 108 111  CO2 27 28  GLUCOSE 129* 111*  BUN 22 16  CREATININE 1.01 0.94  CALCIUM 8.7* 8.7*   GFR: Estimated Creatinine Clearance: 109.1 mL/min (by C-G formula based on SCr of 0.94 mg/dL). Liver Function Tests: No results for input(s): "AST", "ALT", "ALKPHOS", "BILITOT", "PROT", "ALBUMIN" in the last 168 hours. No results for input(s): "LIPASE", "AMYLASE" in the last 168 hours. No results for input(s): "AMMONIA" in the last 168 hours. Coagulation Profile: No results for input(s): "INR", "PROTIME" in the last 168 hours. Cardiac Enzymes: No results for input(s): "CKTOTAL", "CKMB", "CKMBINDEX", "TROPONINI" in the last 168 hours. BNP (last 3 results) No results for input(s): "PROBNP" in the last 8760 hours. HbA1C: Recent Labs    12/10/21 1457  HGBA1C 5.4   CBG: Recent Labs  Lab 12/11/21 0800  GLUCAP 104*   Lipid Profile: No results for input(s): "CHOL", "HDL", "LDLCALC", "TRIG", "CHOLHDL", "LDLDIRECT" in the last 72 hours. Thyroid Function Tests: No results for input(s): "TSH", "T4TOTAL", "FREET4", "T3FREE",  "THYROIDAB" in the last 72 hours. Anemia Panel: No results for input(s): "VITAMINB12", "FOLATE", "FERRITIN", "TIBC", "IRON", "RETICCTPCT" in the last 72 hours. Sepsis Labs: Recent Labs  Lab 12/10/21 0709 12/10/21 1457 12/11/21 0842  PROCALCITON  --   --  <0.10  LATICACIDVEN 0.9 1.5  --     Recent Results (from the past 240 hour(s))  Aerobic/Anaerobic Culture w Gram Stain (surgical/deep wound)     Status: None (Preliminary result)   Collection Time: 12/09/21 10:30 AM   Specimen: Wound  Result Value Ref Range Status   Specimen Description   Final    WOUND Performed at [facility], [address], [place], [place] [address]    Special Requests   Final    LEFT FOOT Performed at [facility], [address] [place], [place] [address]    Gram Stain   Final    RARE WBC PRESENT,BOTH PMN AND MONONUCLEAR FEW GRAM POSITIVE COCCI Performed at [facility], [place] [address], [place],  [address]    Culture   Final    FEW STAPHYLOCOCCUS AUREUS RARE PROTEUS MIRABILIS NO ANAEROBES ISOLATED; CULTURE IN PROGRESS FOR 5 DAYS    Report Status PENDING  Incomplete   Organism ID, Bacteria PROTEUS MIRABILIS  Final   Organism ID, Bacteria STAPHYLOCOCCUS AUREUS  Final      Susceptibility   Proteus  mirabilis - MIC*    AMPICILLIN <=2 SENSITIVE Sensitive     CEFAZOLIN <=4 SENSITIVE Sensitive     CEFEPIME <=0.12 SENSITIVE Sensitive     CEFTAZIDIME <=1 SENSITIVE Sensitive     CEFTRIAXONE <=0.25 SENSITIVE Sensitive     CIPROFLOXACIN <=0.25 SENSITIVE Sensitive     GENTAMICIN <=1 SENSITIVE Sensitive     IMIPENEM 4 SENSITIVE Sensitive     TRIMETH/SULFA <=20 SENSITIVE Sensitive     AMPICILLIN/SULBACTAM <=2 SENSITIVE Sensitive     PIP/TAZO <=4 SENSITIVE Sensitive     * RARE PROTEUS MIRABILIS   Staphylococcus aureus - MIC*    CIPROFLOXACIN <=0.5 SENSITIVE Sensitive     ERYTHROMYCIN <=0.25 SENSITIVE Sensitive     GENTAMICIN <=0.5 SENSITIVE Sensitive     OXACILLIN 0.5  SENSITIVE Sensitive     TETRACYCLINE <=1 SENSITIVE Sensitive     VANCOMYCIN 1 SENSITIVE Sensitive     TRIMETH/SULFA <=10 SENSITIVE Sensitive     CLINDAMYCIN <=0.25 SENSITIVE Sensitive     RIFAMPIN <=0.5 SENSITIVE Sensitive     Inducible Clindamycin NEGATIVE Sensitive     * FEW STAPHYLOCOCCUS AUREUS  Surgical pcr screen     Status: Abnormal   Collection Time: 12/10/21  3:32 PM   Specimen: Nasal Mucosa; Nasal Swab  Result Value Ref Range Status   MRSA, PCR NEGATIVE NEGATIVE Final   Staphylococcus aureus POSITIVE (A) NEGATIVE Final    Comment: (NOTE) The Xpert SA Assay (FDA approved for NASAL specimens in patients 69 years of age and older), is one component of a comprehensive surveillance program. It is not intended to diagnose infection nor to guide or monitor treatment. Performed at [facility], [address] [place], [place] [address]          Radiology Studies: [place] OR NERVE BLOCK-IMAGE ONLY [facility])  Result Date: 12/12/2021 There is no interpretation for this exam.  This order is for images obtained during a surgical procedure.  Please See "Surgeries" Tab for more information regarding the procedure.        Scheduled Meds:  amLODipine  5 mg Oral Daily   ascorbic acid  500 mg Oral Daily   atorvastatin  40 mg Oral QHS   bupivacaine(PF)       bupivacaine(PF)       buPROPion  150 mg Oral Daily   carvedilol  3.125 mg Oral BID WC   Chlorhexidine Gluconate Cloth  6 each Topical Q0600   vitamin D3  2,000 Units Oral Daily   DULoxetine  60 mg Oral Daily   HYDROmorphone       HYDROmorphone       levothyroxine  75 mcg Oral Q0600   lisdexamfetamine  40 mg Oral q morning   metaxalone  1,600 mg Oral TID   midazolam       multivitamin with minerals  1 tablet Oral Daily   mupirocin ointment  1 Application Nasal BID   nutrition supplement (JUVEN)  1 packet Oral BID BM   oxyCODONE       polyethylene glycol  17 g Oral Daily   Ensure Max Protein  11 oz Oral QHS    senna-docusate  1 tablet Oral BID   sodium chloride flush  3 mL Intravenous Q12H   zinc sulfate  220 mg Oral Daily   Continuous Infusions:  sodium chloride     ampicillin-sulbactam (UNASYN) IV       LOS: 2 days       Sidney Ace, MD [facility]  If 7PM-7AM, please contact night-coverage  12/12/2021, 1:12 PM

## 2021-12-12 NOTE — Op Note (Signed)
Date of operation: 12/12/2021.  Surgeon: Durward Fortes D.P.M.  Preoperative diagnosis: Osteomyelitis left forefoot.  Postoperative diagnosis: Same.  Procedure: Transmetatarsal amputation left foot.  Anesthesia: LMA.  Hemostasis: Pneumatic tourniquet left ankle 250 mmHg.  Estimated blood loss: 10 cc.  Cultures: Bone third metatarsal left foot.  Pathology: Left forefoot.  Implants: Stimulan rapid cure antibiotic beads impregnated with vancomycin.  Complications: None apparent.  Operative indications: This is a 61 year old male with chronic ulcerations with recent development of osteomyelitis in the left third metatarsal.  Decision was made for hospitalization for IV antibiotics and transmetatarsal amputation to remove all infected bone.  Operative procedure: Patient was taken to the operating room and placed on the table in the supine position.  Following satisfactory LMA anesthesia the left foot was prepped and draped in usual sterile fashion.  The foot was exsanguinated and the tourniquet inflated to 250 mmHg.  Attention was directed to the left forefoot where a dorsal incision from medial to lateral across the distal forefoot was performed and carried sharply down to the level of bone.  Dissection carried proximally along the metatarsal shafts.  Similar plantar incision was made across the forefoot just proximal to the ulcerations and carried down to the level of bone.  Metatarsals 1 through 4 were then transected using a sagittal saw to align up with the previously resected fifth metatarsal head and the forefoot was removed in toto.  Some devitalized tissue was noted along the dorsal flap and this was sharply excised using a 15 blade and then all areas of the wound were then debrided using a Versajet debrider on a setting of 6.  The wound was then flushed with copious amounts of sterile saline and closed using 2-0 Vicryl simple interrupted sutures for deep closure with 3-0 Vicryl simple  interrupted sutures for more superficial closure with skin closure using staples.  Xeroform 4 x 4's ABD and Kerlix applied to the left lower extremity and the tourniquet was released.  Second Kerlix and Ace wrap applied.  Patient was awakened and transported to the PACU having tolerated the anesthesia and procedures well.

## 2021-12-13 DIAGNOSIS — M86172 Other acute osteomyelitis, left ankle and foot: Secondary | ICD-10-CM | POA: Diagnosis not present

## 2021-12-13 NOTE — Progress Notes (Signed)
PROGRESS NOTE    Charles [name]  TKZ:[id number] DOB: [birth year] DOA: 12/10/2021 PCP: Mylinda Latina, PA-C    Brief Narrative:   61 y.o. male with medical history significant for obesity, depression, hypertension, anxiety, hypothyroidism, status post multiple toe amputations in July.,2023 by Dr. Vickki Muff for osteomyelitis with gangrenous changes of left toes.   Patient presents to the ER today for evaluation of  left foot pain and ulceration with purulent drainage . He had a foot x ray done at his podiatrist's office which showed  interval pathological fracture of the distal third metatarsal at the  metatarsal head region.  Osteopenia of the distal second metatarsal is noted with areas of air within the soft tissue of the second metatarsal head.  This consistent with history of ulceration.  He was advised to go to the ER by his podiatrist for further evaluation. He denies having any fever, no chills, no chest pain, no shortness of breath, no abdominal pain, no nausea, no vomiting, no changes in his bowel habits, no urinary symptoms, no blurred vision or focal deficit. Left foot x-ray showed interval resection of second through fourth toes with soft tissue lucency surrounding the heads of the third and fourth metatarsals likely corresponding to the reported ulceration. There is cortical irregularity with erosive change at the distal metaphysis of third metatarsal raising concern for osteomyelitis. Patient will be admitted to the hospital for IV antibiotics and podiatry will be consulted.  Podiatry consulted.  Patient status post transmetatarsal amputation on 8/25   Assessment & Plan:   Principal Problem:   Osteomyelitis of ankle or foot, acute, left (HCC) Active Problems:   Major depression, chronic   Hypothyroidism   Essential hypertension   Obesity (BMI 30-39.9)   Anemia of chronic disease  Osteomyelitis of ankle or foot, acute, left (HCC) Patient was sent to the ER for evaluation  of left foot pain and ulceration of the plantar surface of the left foot with purulent drainage and imaging  is suggestive of osteomyelitis. Patient has a history of neuropathic foot ulcer and has had multiple amputations Not diabetic Status post TMA 8/25 Plan: Diet as tolerated Pain control Unasyn for now Monitor surgical cultures, no growth to date Monitor vitals and fever curve Podiatry follow-up   Major depression, chronic Continue PTA antidepressant regimen   Hypothyroidism Stable Continue Synthroid   Essential hypertension Blood pressure is stable Continue carvedilol and amlodipine Ensure pain control     Anemia of chronic disease H&H is stable Monitor closely during this hospitalization   Obesity (BMI 30-39.9) BMI 57.32 Complicates overall prognosis and care Patient exercise has been discussed with patient in detail  Chronic pain syndrome Home pain regimen reinitiated pharmacy dosing assistance   DVT prophylaxis: SCD Code Status: Full Family Communication: Daughter at bedside 8/25 Disposition Plan: Status is: Inpatient Remains inpatient appropriate because: Acute osteomyelitis on IV antibiotics.  Status post transmetatarsal amputation.  Postoperative wound care, IV antibiotics   Level of care: Med-Surg  Consultants:  Podiatry  Procedures:  None  Antimicrobials: Unasyn   Subjective: Seen and examined.  Sitting up in chair eating breakfast.  No distress.  Pain well controlled.  Objective: Vitals:   12/12/21 2050 12/13/21 0000 12/13/21 0512 12/13/21 0918  BP: (!) 111/57 119/61 (!) 147/81 (!) 122/56  Pulse: 63 64 (!) 56 77  Resp: '18 18 18   '$ Temp: 97.7 F (36.5 C) 97.9 F (36.6 C) 98 F (36.7 C) 97.8 F (36.6 C)  TempSrc:  SpO2: 95% 95% 99% 97%  Weight:      Height:        Intake/Output Summary (Last 24 hours) at 12/13/2021 1114 Last data filed at 12/13/2021 1034 Gross per 24 hour  Intake 785.05 ml  Output 850 ml  Net -64.95 ml    Filed Weights   12/10/21 0624 12/12/21 0700  Weight: 113.9 kg 113.9 kg    Examination:  General exam: NAD Respiratory system: Clear to auscultation. Respiratory effort normal. Cardiovascular system: S1-S2, RRR, no murmurs, no pedal edema Gastrointestinal system: Soft, NT/ND, normal bowel sounds Central nervous system: Alert and oriented. No focal neurological deficits. Extremities: Left foot in surgical wraps, not removed, dressing CDI Skin: No rashes, lesions or ulcers Psychiatry: Judgement and insight appear normal. Mood & affect appropriate.     Data Reviewed: I have personally reviewed following labs and imaging studies  CBC: Recent Labs  Lab 12/10/21 0709 12/11/21 0425  WBC 6.9 5.6  NEUTROABS 3.7  --   HGB 10.2* 9.9*  HCT 32.1* 31.6*  MCV 89.2 90.5  PLT 471* 433*   Basic Metabolic Panel: Recent Labs  Lab 12/10/21 0709 12/11/21 0425  NA 140 142  K 3.9 4.1  CL 108 111  CO2 27 28  GLUCOSE 129* 111*  BUN 22 16  CREATININE 1.01 0.94  CALCIUM 8.7* 8.7*   GFR: Estimated Creatinine Clearance: 109.1 mL/min (by C-G formula based on SCr of 0.94 mg/dL). Liver Function Tests: No results for input(s): "AST", "ALT", "ALKPHOS", "BILITOT", "PROT", "ALBUMIN" in the last 168 hours. No results for input(s): "LIPASE", "AMYLASE" in the last 168 hours. No results for input(s): "AMMONIA" in the last 168 hours. Coagulation Profile: No results for input(s): "INR", "PROTIME" in the last 168 hours. Cardiac Enzymes: No results for input(s): "CKTOTAL", "CKMB", "CKMBINDEX", "TROPONINI" in the last 168 hours. BNP (last 3 results) No results for input(s): "PROBNP" in the last 8760 hours. HbA1C: Recent Labs    12/10/21 1457  HGBA1C 5.4   CBG: Recent Labs  Lab 12/11/21 0800  GLUCAP 104*   Lipid Profile: No results for input(s): "CHOL", "HDL", "LDLCALC", "TRIG", "CHOLHDL", "LDLDIRECT" in the last 72 hours. Thyroid Function Tests: No results for input(s): "TSH",  "T4TOTAL", "FREET4", "T3FREE", "THYROIDAB" in the last 72 hours. Anemia Panel: No results for input(s): "VITAMINB12", "FOLATE", "FERRITIN", "TIBC", "IRON", "RETICCTPCT" in the last 72 hours. Sepsis Labs: Recent Labs  Lab 12/10/21 0709 12/10/21 1457 12/11/21 0842  PROCALCITON  --   --  <0.10  LATICACIDVEN 0.9 1.5  --     Recent Results (from the past 240 hour(s))  Aerobic/Anaerobic Culture w Gram Stain (surgical/deep wound)     Status: None (Preliminary result)   Collection Time: 12/09/21 10:30 AM   Specimen: Wound  Result Value Ref Range Status   Specimen Description   Final    WOUND Performed at [facility], [address], [place], [place] [address]    Special Requests   Final    LEFT FOOT Performed at [facility], [address] [place], [place] [address]    Gram Stain   Final    RARE WBC PRESENT,BOTH PMN AND MONONUCLEAR FEW GRAM POSITIVE COCCI Performed at [facility], [place] [address], [place], [place] [address]    Culture   Final    FEW STAPHYLOCOCCUS AUREUS RARE PROTEUS MIRABILIS NO ANAEROBES ISOLATED; CULTURE IN PROGRESS FOR 5 DAYS    Report Status PENDING  Incomplete   Organism ID, Bacteria PROTEUS MIRABILIS  Final   Organism ID, Bacteria STAPHYLOCOCCUS AUREUS  Final      Susceptibility   Proteus mirabilis - MIC*    AMPICILLIN <=2 SENSITIVE Sensitive     CEFAZOLIN <=4 SENSITIVE Sensitive     CEFEPIME <=0.12 SENSITIVE Sensitive     CEFTAZIDIME <=1 SENSITIVE Sensitive     CEFTRIAXONE <=0.25 SENSITIVE Sensitive     CIPROFLOXACIN <=0.25 SENSITIVE Sensitive     GENTAMICIN <=1 SENSITIVE Sensitive     IMIPENEM 4 SENSITIVE Sensitive     TRIMETH/SULFA <=20 SENSITIVE Sensitive     AMPICILLIN/SULBACTAM <=2 SENSITIVE Sensitive     PIP/TAZO <=4 SENSITIVE Sensitive     * RARE PROTEUS MIRABILIS   Staphylococcus aureus - MIC*    CIPROFLOXACIN <=0.5 SENSITIVE Sensitive     ERYTHROMYCIN <=0.25 SENSITIVE Sensitive     GENTAMICIN <=0.5  SENSITIVE Sensitive     OXACILLIN 0.5 SENSITIVE Sensitive     TETRACYCLINE <=1 SENSITIVE Sensitive     VANCOMYCIN 1 SENSITIVE Sensitive     TRIMETH/SULFA <=10 SENSITIVE Sensitive     CLINDAMYCIN <=0.25 SENSITIVE Sensitive     RIFAMPIN <=0.5 SENSITIVE Sensitive     Inducible Clindamycin NEGATIVE Sensitive     * FEW STAPHYLOCOCCUS AUREUS  Surgical pcr screen     Status: Abnormal   Collection Time: 12/10/21  3:32 PM   Specimen: Nasal Mucosa; Nasal Swab  Result Value Ref Range Status   MRSA, PCR NEGATIVE NEGATIVE Final   Staphylococcus aureus POSITIVE (A) NEGATIVE Final    Comment: (NOTE) The Xpert SA Assay (FDA approved for NASAL specimens in patients 26 years of age and older), is one component of a comprehensive surveillance program. It is not intended to diagnose infection nor to guide or monitor treatment. Performed at [facility], [address], [place], [place] [address]   Aerobic/Anaerobic Culture w Gram Stain (surgical/deep wound)     Status: None (Preliminary result)   Collection Time: 12/12/21  8:00 AM   Specimen: PATH Other; Tissue  Result Value Ref Range Status   Specimen Description   Final    TOE THIRD METATARSAL HEAD Performed at [facility], [address], [place], [place] [address]    Special Requests   Final    NONE Performed at [facility], [address] [place], [place] [address]    Gram Stain   Final    RARE WBC PRESENT, PREDOMINANTLY PMN NO ORGANISMS SEEN    Culture   Final    CULTURE REINCUBATED FOR BETTER GROWTH Performed at [facility], [place] [address], [place], [place] [address]    Report Status PENDING  Incomplete         Radiology Studies: [place] OR NERVE BLOCK-IMAGE ONLY [facility])  Result Date: 12/12/2021 There is no interpretation for this exam.  This order is for images obtained during a surgical procedure.  Please See "Surgeries" Tab for more information regarding the procedure.         Scheduled Meds:  amLODipine  5 mg Oral Daily   ascorbic acid  500 mg Oral Daily   atorvastatin  40 mg Oral QHS   buPROPion  150 mg Oral Daily   carvedilol  3.125 mg Oral BID WC   Chlorhexidine Gluconate Cloth  6 each Topical Q0600   vitamin D3  2,000 Units Oral Daily   DULoxetine  60 mg Oral Daily   levothyroxine  75 mcg Oral Q0600   lisdexamfetamine  40 mg Oral q morning  metaxalone  1,600 mg Oral TID   multivitamin with minerals  1 tablet Oral Daily   mupirocin ointment  1 Application Nasal BID   nutrition supplement (JUVEN)  1 packet Oral BID BM   polyethylene glycol  17 g Oral Daily   Ensure Max Protein  11 oz Oral QHS   senna-docusate  1 tablet Oral BID   sodium chloride flush  3 mL Intravenous Q12H   zinc sulfate  220 mg Oral Daily   Continuous Infusions:  sodium chloride     ampicillin-sulbactam (UNASYN) IV 3 g (12/13/21 0923)     LOS: 3 days       Sidney Ace, MD [facility]   If 7PM-7AM, please contact night-coverage  12/13/2021, 11:14 AM

## 2021-12-13 NOTE — Anesthesia Postprocedure Evaluation (Signed)
Anesthesia Post Note  Patient: Charles [name]  Procedure(s) Performed: TRANSMETATARSAL AMPUTATION (Left: Toe)  Patient location during evaluation: PACU Anesthesia Type: General Level of consciousness: awake and alert Pain management: pain level controlled Vital Signs Assessment: post-procedure vital signs reviewed and stable Respiratory status: spontaneous breathing, nonlabored ventilation, respiratory function stable and patient connected to nasal cannula oxygen Cardiovascular status: blood pressure returned to baseline and stable Postop Assessment: no apparent nausea or vomiting Anesthetic complications: no   No notable events documented.   Last Vitals:  Vitals:   12/13/21 0918 12/13/21 1207  BP: (!) 122/56 (!) 153/81  Pulse: 77 76  Resp:    Temp: 36.6 C 36.8 C  SpO2: 97% 99%    Last Pain:  Vitals:   12/13/21 1500  TempSrc:   PainSc: [place] Eleftherios Dudenhoeffer

## 2021-12-13 NOTE — Progress Notes (Signed)
1 Day Post-Op   Subjective/Chief Complaint: Patient seen.  Still some pain in his left foot but improving and controlled with medication.   Objective: Vital signs in last 24 hours: Temp:  [97.7 F (36.5 C)-98 F (36.7 C)] 97.8 F (36.6 C) (08/26 0918) Pulse Rate:  [56-92] 77 (08/26 0918) Resp:  [16-18] 18 (08/26 0512) BP: (111-147)/(56-81) 122/56 (08/26 0918) SpO2:  [95 %-99 %] 97 % (08/26 0918) Last BM Date : 12/11/21  Intake/Output from previous day: 08/25 0701 - 08/26 0700 In: 885.1 [P.O.:120; I.V.:710; IV Piggyback:55.1] Out: 1177 [Urine:1175; Blood:2] Intake/Output this shift: Total I/O In: 720 [P.O.:720] Out: -   Strikethrough bleeding noted on the bandaging and Ace wrap on the left foot.  Upon removal the incision is well coapted with staples intact and no active bleeding.  No significant cellulitis or signs of purulence.   Lab Results:  Recent Labs    12/11/21 0425  WBC 5.6  HGB 9.9*  HCT 31.6*  PLT 433*   BMET Recent Labs    12/11/21 0425  NA 142  K 4.1  CL 111  CO2 28  GLUCOSE 111*  BUN 16  CREATININE 0.94  CALCIUM 8.7*   PT/INR No results for input(s): "LABPROT", "INR" in the last 72 hours. ABG No results for input(s): "PHART", "HCO3" in the last 72 hours.  Invalid input(s): "PCO2", "PO2"  Studies/Results: [place] OR NERVE BLOCK-IMAGE ONLY [facility])  Result Date: 12/12/2021 There is no interpretation for this exam.  This order is for images obtained during a surgical procedure.  Please See "Surgeries" Tab for more information regarding the procedure.    Anti-infectives: Anti-infectives (From admission, onward)    Start     Dose/Rate Route Frequency Ordered Stop   12/12/21 1800  Ampicillin-Sulbactam (UNASYN) 3 g in sodium chloride 0.9 % 100 mL IVPB        3 g 200 mL/hr over 30 Minutes Intravenous Every 6 hours 12/12/21 1225     12/12/21 0814  vancomycin (VANCOCIN) powder  Status:  Discontinued          As needed 12/12/21 0814 12/12/21 0856    12/10/21 1600  cefTRIAXone (ROCEPHIN) 2 g in sodium chloride 0.9 % 100 mL IVPB  Status:  Discontinued       See Hyperspace for full Linked Orders Report.   2 g 200 mL/hr over 30 Minutes Intravenous Every 24 hours 12/10/21 0958 12/12/21 1225   12/10/21 1000  metroNIDAZOLE (FLAGYL) IVPB 500 mg  Status:  Discontinued       See Hyperspace for full Linked Orders Report.   500 mg 100 mL/hr over 60 Minutes Intravenous Every 12 hours 12/10/21 0958 12/12/21 1225   12/10/21 0745  ceFEPIme (MAXIPIME) 2 g in sodium chloride 0.9 % 100 mL IVPB        2 g 200 mL/hr over 30 Minutes Intravenous  Once 12/10/21 0737 12/10/21 0816   12/10/21 0745  vancomycin (VANCOCIN) IVPB 1000 mg/200 mL premix        1,000 mg 200 mL/hr over 60 Minutes Intravenous  Once 12/10/21 0737 12/10/21 0904       Assessment/Plan: s/p Procedure(s): TRANSMETATARSAL AMPUTATION (Left) Assessment: Stable status post transmetatarsal amputation left foot.  Betadine gauze and sterile bandage reapplied to the left foot.  Continue to monitor through the weekend and we will wait for culture results to return as well as pathology.  Patient most likely would benefit from IV antibiotics and PICC line for some amount of time on  discharge.  Most likely will consult infectious disease on Monday.  Plan for dressing change on Monday.  LOS: 3 days    Durward Fortes 12/13/2021

## 2021-12-14 DIAGNOSIS — M86172 Other acute osteomyelitis, left ankle and foot: Secondary | ICD-10-CM | POA: Diagnosis not present

## 2021-12-14 LAB — AEROBIC/ANAEROBIC CULTURE W GRAM STAIN (SURGICAL/DEEP WOUND)

## 2021-12-14 NOTE — Progress Notes (Signed)
PROGRESS NOTE    Charles [name]  KYH:[id number] DOB: Jul 30, 1960 DOA: 12/10/2021 PCP: Mylinda Latina, PA-C    Brief Narrative:   61 y.o. male with medical history significant for obesity, depression, hypertension, anxiety, hypothyroidism, status post multiple toe amputations in July.,2023 by Dr. Vickki Muff for osteomyelitis with gangrenous changes of left toes.   Patient presents to the ER today for evaluation of  left foot pain and ulceration with purulent drainage . He had a foot x ray done at his podiatrist's office which showed  interval pathological fracture of the distal third metatarsal at the  metatarsal head region.  Osteopenia of the distal second metatarsal is noted with areas of air within the soft tissue of the second metatarsal head.  This consistent with history of ulceration.  He was advised to go to the ER by his podiatrist for further evaluation. He denies having any fever, no chills, no chest pain, no shortness of breath, no abdominal pain, no nausea, no vomiting, no changes in his bowel habits, no urinary symptoms, no blurred vision or focal deficit. Left foot x-ray showed interval resection of second through fourth toes with soft tissue lucency surrounding the heads of the third and fourth metatarsals likely corresponding to the reported ulceration. There is cortical irregularity with erosive change at the distal metaphysis of third metatarsal raising concern for osteomyelitis. Patient will be admitted to the hospital for IV antibiotics and podiatry will be consulted.  Podiatry consulted.  Patient status post transmetatarsal amputation on 8/25.  Postoperative course uncomplicated.  Rare Staph aureus growing on surgical cultures.   Assessment & Plan:   Principal Problem:   Osteomyelitis of ankle or foot, acute, left (HCC) Active Problems:   Major depression, chronic   Hypothyroidism   Essential hypertension   Obesity (BMI 30-39.9)   Anemia of chronic  disease  Osteomyelitis of ankle or foot, acute, left (HCC) Patient was sent to the ER for evaluation of left foot pain and ulceration of the plantar surface of the left foot with purulent drainage and imaging  is suggestive of osteomyelitis. Patient has a history of neuropathic foot ulcer and has had multiple amputations Not diabetic Status post TMA 8/25 Plan: Diet as tolerated Pain control Continue Unasyn for now Monitor surgical cultures, rare Staph aureus Monitor vitals and fever curve Podiatry follow-up Anticipate involvement of infectious disease and need for IV antibiotics   Major depression, chronic Continue PTA antidepressant regimen   Hypothyroidism Stable Continue Synthroid   Essential hypertension Blood pressure is stable Continue carvedilol and amlodipine Ensure pain control     Anemia of chronic disease H&H is stable Monitor closely during this hospitalization   Obesity (BMI 30-39.9) BMI 15.17 Complicates overall prognosis and care Patient exercise has been discussed with patient in detail  Chronic pain syndrome Home pain regimen reinitiated pharmacy dosing assistance   DVT prophylaxis: SCD Code Status: Full Family Communication: Daughter at bedside 8/25 Disposition Plan: Status is: Inpatient Remains inpatient appropriate because: Acute osteomyelitis on IV antibiotics.  Status post transmetatarsal amputation.  Postoperative wound care, IV antibiotics   Level of care: Med-Surg  Consultants:  Podiatry  Procedures:  None  Antimicrobials: Unasyn   Subjective: Seen and examined.  Standing up in room.  No visible distress.  No complaints of pain.  Objective: Vitals:   12/14/21 0026 12/14/21 0518 12/14/21 0726 12/14/21 1117  BP: 133/79 (!) 140/75 (!) 143/86 134/76  Pulse: 60 (!) 58 (!) 54 65  Resp: '20 20 16 '$ 16  Temp: 98.2 F (36.8 C) 97.8 F (36.6 C) 97.7 F (36.5 C) 97.7 F (36.5 C)  TempSrc:      SpO2: 97% 97% 99% 97%  Weight:       Height:        Intake/Output Summary (Last 24 hours) at 12/14/2021 1144 Last data filed at 12/14/2021 1025 Gross per 24 hour  Intake 1320 ml  Output 400 ml  Net 920 ml   Filed Weights   12/10/21 0624 12/12/21 0700  Weight: 113.9 kg 113.9 kg    Examination:  General exam: No acute distress Respiratory system: Clear to auscultation. Respiratory effort normal. Cardiovascular system: S1-S2, RRR, no murmurs, no pedal edema Gastrointestinal system: Soft, NT/ND, normal bowel sounds Central nervous system: Alert and oriented. No focal neurological deficits. Extremities: Left foot in surgical wraps, not removed, dressing CDI Skin: No rashes, lesions or ulcers Psychiatry: Judgement and insight appear normal. Mood & affect appropriate.     Data Reviewed: I have personally reviewed following labs and imaging studies  CBC: Recent Labs  Lab 12/10/21 0709 12/11/21 0425  WBC 6.9 5.6  NEUTROABS 3.7  --   HGB 10.2* 9.9*  HCT 32.1* 31.6*  MCV 89.2 90.5  PLT 471* 329*   Basic Metabolic Panel: Recent Labs  Lab 12/10/21 0709 12/11/21 0425  NA 140 142  K 3.9 4.1  CL 108 111  CO2 27 28  GLUCOSE 129* 111*  BUN 22 16  CREATININE 1.01 0.94  CALCIUM 8.7* 8.7*   GFR: Estimated Creatinine Clearance: 109.1 mL/min (by C-G formula based on SCr of 0.94 mg/dL). Liver Function Tests: No results for input(s): "AST", "ALT", "ALKPHOS", "BILITOT", "PROT", "ALBUMIN" in the last 168 hours. No results for input(s): "LIPASE", "AMYLASE" in the last 168 hours. No results for input(s): "AMMONIA" in the last 168 hours. Coagulation Profile: No results for input(s): "INR", "PROTIME" in the last 168 hours. Cardiac Enzymes: No results for input(s): "CKTOTAL", "CKMB", "CKMBINDEX", "TROPONINI" in the last 168 hours. BNP (last 3 results) No results for input(s): "PROBNP" in the last 8760 hours. HbA1C: No results for input(s): "HGBA1C" in the last 72 hours.  CBG: Recent Labs  Lab 12/11/21 0800   GLUCAP 104*   Lipid Profile: No results for input(s): "CHOL", "HDL", "LDLCALC", "TRIG", "CHOLHDL", "LDLDIRECT" in the last 72 hours. Thyroid Function Tests: No results for input(s): "TSH", "T4TOTAL", "FREET4", "T3FREE", "THYROIDAB" in the last 72 hours. Anemia Panel: No results for input(s): "VITAMINB12", "FOLATE", "FERRITIN", "TIBC", "IRON", "RETICCTPCT" in the last 72 hours. Sepsis Labs: Recent Labs  Lab 12/10/21 0709 12/10/21 1457 12/11/21 0842  PROCALCITON  --   --  <0.10  LATICACIDVEN 0.9 1.5  --     Recent Results (from the past 240 hour(s))  Aerobic/Anaerobic Culture w Gram Stain (surgical/deep wound)     Status: None (Preliminary result)   Collection Time: 12/09/21 10:30 AM   Specimen: Wound  Result Value Ref Range Status   Specimen Description   Final    WOUND Performed at [facility], [address], [place], [place] [address]    Special Requests   Final    LEFT FOOT Performed at [facility], [address] [place], [place] [address]    Gram Stain   Final    RARE WBC PRESENT,BOTH PMN AND MONONUCLEAR FEW GRAM POSITIVE COCCI Performed at [facility], [place] [address] [place], [place] [address]    Culture   Final    FEW STAPHYLOCOCCUS AUREUS RARE PROTEUS MIRABILIS  NO ANAEROBES ISOLATED; CULTURE IN PROGRESS FOR 5 DAYS    Report Status PENDING  Incomplete   Organism ID, Bacteria PROTEUS MIRABILIS  Final   Organism ID, Bacteria STAPHYLOCOCCUS AUREUS  Final      Susceptibility   Proteus mirabilis - MIC*    AMPICILLIN <=2 SENSITIVE Sensitive     CEFAZOLIN <=4 SENSITIVE Sensitive     CEFEPIME <=0.12 SENSITIVE Sensitive     CEFTAZIDIME <=1 SENSITIVE Sensitive     CEFTRIAXONE <=0.25 SENSITIVE Sensitive     CIPROFLOXACIN <=0.25 SENSITIVE Sensitive     GENTAMICIN <=1 SENSITIVE Sensitive     IMIPENEM 4 SENSITIVE Sensitive     TRIMETH/SULFA <=20 SENSITIVE Sensitive     AMPICILLIN/SULBACTAM <=2 SENSITIVE Sensitive     PIP/TAZO <=4  SENSITIVE Sensitive     * RARE PROTEUS MIRABILIS   Staphylococcus aureus - MIC*    CIPROFLOXACIN <=0.5 SENSITIVE Sensitive     ERYTHROMYCIN <=0.25 SENSITIVE Sensitive     GENTAMICIN <=0.5 SENSITIVE Sensitive     OXACILLIN 0.5 SENSITIVE Sensitive     TETRACYCLINE <=1 SENSITIVE Sensitive     VANCOMYCIN 1 SENSITIVE Sensitive     TRIMETH/SULFA <=10 SENSITIVE Sensitive     CLINDAMYCIN <=0.25 SENSITIVE Sensitive     RIFAMPIN <=0.5 SENSITIVE Sensitive     Inducible Clindamycin NEGATIVE Sensitive     * FEW STAPHYLOCOCCUS AUREUS  Surgical pcr screen     Status: Abnormal   Collection Time: 12/10/21  3:32 PM   Specimen: Nasal Mucosa; Nasal Swab  Result Value Ref Range Status   MRSA, PCR NEGATIVE NEGATIVE Final   Staphylococcus aureus POSITIVE (A) NEGATIVE Final    Comment: (NOTE) The Xpert SA Assay (FDA approved for NASAL specimens in patients 37 years of age and older), is one component of a comprehensive surveillance program. It is not intended to diagnose infection nor to guide or monitor treatment. Performed at [facility], [address] [place], [place] [address]   Aerobic/Anaerobic Culture w Gram Stain (surgical/deep wound)     Status: None (Preliminary result)   Collection Time: 12/12/21  8:00 AM   Specimen: PATH Other; Tissue  Result Value Ref Range Status   Specimen Description   Final    TOE THIRD METATARSAL HEAD Performed at [facility], [address] [place], [place] [address]    Special Requests   Final    NONE Performed at [facility], [address] [place], [place] [address]    Gram Stain   Final    RARE WBC PRESENT, PREDOMINANTLY PMN NO ORGANISMS SEEN    Culture   Final    RARE STAPHYLOCOCCUS AUREUS CULTURE REINCUBATED FOR BETTER GROWTH Performed at [facility], [place] [address], [place], [place] [address]    Report Status PENDING  Incomplete         Radiology Studies: No results found.      Scheduled  Meds:  amLODipine  5 mg Oral Daily   ascorbic acid  500 mg Oral Daily   atorvastatin  40 mg Oral QHS   buPROPion  150 mg Oral Daily   carvedilol  3.125 mg Oral BID WC   Chlorhexidine Gluconate Cloth  6 each Topical Q0600   vitamin D3  2,000 Units Oral Daily   DULoxetine  60 mg Oral Daily   levothyroxine  75 mcg Oral Q0600   lisdexamfetamine  40 mg Oral q morning   metaxalone  1,600 mg Oral TID   multivitamin with  minerals  1 tablet Oral Daily   mupirocin ointment  1 Application Nasal BID   nutrition supplement (JUVEN)  1 packet Oral BID BM   polyethylene glycol  17 g Oral Daily   Ensure Max Protein  11 oz Oral QHS   senna-docusate  1 tablet Oral BID   sodium chloride flush  3 mL Intravenous Q12H   zinc sulfate  220 mg Oral Daily   Continuous Infusions:  sodium chloride     ampicillin-sulbactam (UNASYN) IV 3 g (12/14/21 0957)     LOS: 4 days       Sidney Ace, MD [facility]   If 7PM-7AM, please contact night-coverage  12/14/2021, 11:44 AM

## 2021-12-14 NOTE — Plan of Care (Signed)
  Problem: Activity: Goal: Risk for activity intolerance will decrease Outcome: Progressing   Problem: Coping: Goal: Level of anxiety will decrease Outcome: Progressing   

## 2021-12-15 ENCOUNTER — Encounter: Payer: Self-pay | Admitting: Podiatry

## 2021-12-15 ENCOUNTER — Inpatient Hospital Stay: Payer: Self-pay

## 2021-12-15 DIAGNOSIS — Z9884 Bariatric surgery status: Secondary | ICD-10-CM

## 2021-12-15 DIAGNOSIS — M86172 Other acute osteomyelitis, left ankle and foot: Secondary | ICD-10-CM | POA: Diagnosis not present

## 2021-12-15 DIAGNOSIS — E1140 Type 2 diabetes mellitus with diabetic neuropathy, unspecified: Secondary | ICD-10-CM | POA: Diagnosis not present

## 2021-12-15 DIAGNOSIS — E039 Hypothyroidism, unspecified: Secondary | ICD-10-CM | POA: Diagnosis not present

## 2021-12-15 DIAGNOSIS — F32A Depression, unspecified: Secondary | ICD-10-CM

## 2021-12-15 DIAGNOSIS — Z89439 Acquired absence of unspecified foot: Secondary | ICD-10-CM

## 2021-12-15 DIAGNOSIS — E1169 Type 2 diabetes mellitus with other specified complication: Secondary | ICD-10-CM

## 2021-12-15 LAB — SURGICAL PATHOLOGY

## 2021-12-15 MED ORDER — CEFAZOLIN IV (FOR PTA / DISCHARGE USE ONLY): 2.0000 g | Freq: Three times a day (TID) | INTRAVENOUS | 0 refills | Status: AC

## 2021-12-15 MED ORDER — CEFAZOLIN SODIUM-DEXTROSE 2-4 GM/100ML-% IV SOLN
2.0000 g | Freq: Three times a day (TID) | INTRAVENOUS | Status: DC
  Administered 2021-12-15: 2 g via INTRAVENOUS
  Filled 2021-12-15: qty 100

## 2021-12-15 MED ORDER — SODIUM CHLORIDE 0.9% FLUSH: 10.0000 mL | INTRAVENOUS | Status: DC | PRN

## 2021-12-15 MED ORDER — CHLORHEXIDINE GLUCONATE CLOTH 2 % EX PADS
6.0000 | MEDICATED_PAD | Freq: Every day | CUTANEOUS | Status: DC
Start: 2021-12-15 — End: 2021-12-16

## 2021-12-15 MED ORDER — SODIUM CHLORIDE 0.9% FLUSH: 10.0000 mL | Freq: Two times a day (BID) | INTRAVENOUS | Status: DC

## 2021-12-15 NOTE — TOC Progression Note (Signed)
Transition of Care [facility]) - Progression Note    Patient Details  Name: Charles [name] MRN: [id number] Date of Birth: 10/19/1960  Transition of Care [facility]) CM/SW [place], RN Phone Number: 12/15/2021, 1:33 PM  Clinical Narrative:     Reached out to Pam at [facility] and let her know that the patient will be getting a PICC line and will need Teaching, hope to DC today, May need [facility] to see him for [facility] RN       Expected Discharge Plan and Services                                                 Social Determinants of Health (SDOH) Interventions    Readmission Risk Interventions    10/22/2021    1:14 PM 08/31/2021    9:49 AM  Readmission Risk Prevention Plan  Transportation Screening Complete Complete  PCP or Specialist Appt within 3-5 Days Complete Complete  HRI or [place]  Complete  Social Work Consult for [place] Planning/Counseling Complete Complete  Palliative Care Screening Not Applicable Not Applicable  Medication Review Press photographer) Complete Complete

## 2021-12-15 NOTE — Progress Notes (Signed)
PROGRESS NOTE    Charles [name]  DUK:[id number] DOB: 12-31-1960 DOA: 12/10/2021 PCP: Mylinda Latina, PA-C    Brief Narrative:   60 y.o. male with medical history significant for obesity, depression, hypertension, anxiety, hypothyroidism, status post multiple toe amputations in July.,2023 by Dr. Vickki Muff for osteomyelitis with gangrenous changes of left toes.   Patient presents to the ER today for evaluation of  left foot pain and ulceration with purulent drainage . He had a foot x ray done at his podiatrist's office which showed  interval pathological fracture of the distal third metatarsal at the  metatarsal head region.  Osteopenia of the distal second metatarsal is noted with areas of air within the soft tissue of the second metatarsal head.  This consistent with history of ulceration.  He was advised to go to the ER by his podiatrist for further evaluation. He denies having any fever, no chills, no chest pain, no shortness of breath, no abdominal pain, no nausea, no vomiting, no changes in his bowel habits, no urinary symptoms, no blurred vision or focal deficit. Left foot x-ray showed interval resection of second through fourth toes with soft tissue lucency surrounding the heads of the third and fourth metatarsals likely corresponding to the reported ulceration. There is cortical irregularity with erosive change at the distal metaphysis of third metatarsal raising concern for osteomyelitis. Patient will be admitted to the hospital for IV antibiotics and podiatry will be consulted.  Podiatry consulted.  Patient status post transmetatarsal amputation on 8/25.  Postoperative course uncomplicated.  Rare Staph aureus growing on surgical cultures.  Sensitivities pending   Assessment & Plan:   Principal Problem:   Osteomyelitis of ankle or foot, acute, left (HCC) Active Problems:   Major depression, chronic   Hypothyroidism   Essential hypertension   Obesity (BMI 30-39.9)   Anemia of  chronic disease  Osteomyelitis of ankle or foot, acute, left (HCC) Patient was sent to the ER for evaluation of left foot pain and ulceration of the plantar surface of the left foot with purulent drainage and imaging  is suggestive of osteomyelitis. Patient has a history of neuropathic foot ulcer and has had multiple amputations Not diabetic Status post TMA 8/25 Postoperative pain mild Plan: Continue Unasyn for now Monitor surgical cultures, rare Staph aureus.  Sensitivities pending Monitor vitals and fever curve Podiatry follow-up ID consulted 8/28 for consideration of IV antibiotic therapy   Major depression, chronic Continue PTA antidepressant regimen   Hypothyroidism Stable Continue Synthroid   Essential hypertension Blood pressure is stable Continue carvedilol and amlodipine Ensure pain control     Anemia of chronic disease H&H is stable Monitor closely during this hospitalization   Obesity (BMI 30-39.9) BMI 37.62 Complicates overall prognosis and care Patient exercise has been discussed with patient in detail  Chronic pain syndrome Home pain regimen reinitiated pharmacy dosing assistance   DVT prophylaxis: SCD Code Status: Full Family Communication: Daughter at bedside 8/25 Disposition Plan: Status is: Inpatient Remains inpatient appropriate because: Acute osteomyelitis on IV antibiotics.  Status post transmetatarsal amputation.  Postoperative wound care, IV antibiotics.  ID consult.  Possible discharge in 24 hours   Level of care: Med-Surg  Consultants:  Podiatry  Procedures:  None  Antimicrobials: Unasyn   Subjective: Seen and examined.  Standing up in room.  No visible distress.  No complaints of pain.  Objective: Vitals:   12/14/21 1513 12/14/21 2116 12/15/21 0404 12/15/21 0927  BP: (!) 147/82 121/67 (!) 140/83 (!) 161/82  Pulse:  69 72 (!) 51 (!) 59  Resp: '16 17 17 16  '$ Temp: 97.6 F (36.4 C) 97.8 F (36.6 C) 97.7 F (36.5 C) 98 F  (36.7 C)  TempSrc:      SpO2: 97% 96% 98% 98%  Weight:      Height:        Intake/Output Summary (Last 24 hours) at 12/15/2021 1018 Last data filed at 12/15/2021 0406 Gross per 24 hour  Intake 1320 ml  Output 1450 ml  Net -130 ml   Filed Weights   12/10/21 0624 12/12/21 0700  Weight: 113.9 kg 113.9 kg    Examination:  General exam: In ED Respiratory system: Clear to auscultation. Respiratory effort normal. Cardiovascular system: S1-S2, RRR, no murmurs, no pedal edema Gastrointestinal system: Soft, NT/ND, normal bowel sounds Central nervous system: Alert and oriented. No focal neurological deficits. Extremities: Left foot in surgical wraps, not removed, surgical dressing CDI Skin: No rashes, lesions or ulcers Psychiatry: Judgement and insight appear normal. Mood & affect appropriate.     Data Reviewed: I have personally reviewed following labs and imaging studies  CBC: Recent Labs  Lab 12/10/21 0709 12/11/21 0425  WBC 6.9 5.6  NEUTROABS 3.7  --   HGB 10.2* 9.9*  HCT 32.1* 31.6*  MCV 89.2 90.5  PLT 471* 109*   Basic Metabolic Panel: Recent Labs  Lab 12/10/21 0709 12/11/21 0425  NA 140 142  K 3.9 4.1  CL 108 111  CO2 27 28  GLUCOSE 129* 111*  BUN 22 16  CREATININE 1.01 0.94  CALCIUM 8.7* 8.7*   GFR: Estimated Creatinine Clearance: 109.1 mL/min (by C-G formula based on SCr of 0.94 mg/dL). Liver Function Tests: No results for input(s): "AST", "ALT", "ALKPHOS", "BILITOT", "PROT", "ALBUMIN" in the last 168 hours. No results for input(s): "LIPASE", "AMYLASE" in the last 168 hours. No results for input(s): "AMMONIA" in the last 168 hours. Coagulation Profile: No results for input(s): "INR", "PROTIME" in the last 168 hours. Cardiac Enzymes: No results for input(s): "CKTOTAL", "CKMB", "CKMBINDEX", "TROPONINI" in the last 168 hours. BNP (last 3 results) No results for input(s): "PROBNP" in the last 8760 hours. HbA1C: No results for input(s): "HGBA1C" in the  last 72 hours.  CBG: Recent Labs  Lab 12/11/21 0800  GLUCAP 104*   Lipid Profile: No results for input(s): "CHOL", "HDL", "LDLCALC", "TRIG", "CHOLHDL", "LDLDIRECT" in the last 72 hours. Thyroid Function Tests: No results for input(s): "TSH", "T4TOTAL", "FREET4", "T3FREE", "THYROIDAB" in the last 72 hours. Anemia Panel: No results for input(s): "VITAMINB12", "FOLATE", "FERRITIN", "TIBC", "IRON", "RETICCTPCT" in the last 72 hours. Sepsis Labs: Recent Labs  Lab 12/10/21 0709 12/10/21 1457 12/11/21 0842  PROCALCITON  --   --  <0.10  LATICACIDVEN 0.9 1.5  --     Recent Results (from the past 240 hour(s))  Aerobic/Anaerobic Culture w Gram Stain (surgical/deep wound)     Status: None   Collection Time: 12/09/21 10:30 AM   Specimen: Wound  Result Value Ref Range Status   Specimen Description   Final    WOUND Performed at [facility], [address], [place], [place] [address]    Special Requests   Final    LEFT FOOT Performed at [facility], [address] [place], [place] [address]    Gram Stain   Final    RARE WBC PRESENT,BOTH PMN AND MONONUCLEAR FEW GRAM POSITIVE COCCI    Culture   Final    FEW STAPHYLOCOCCUS AUREUS RARE PROTEUS MIRABILIS NO ANAEROBES  ISOLATED Performed at [facility], [place] [address] [place], [place] [address]    Report Status 12/14/2021 FINAL  Final   Organism ID, Bacteria PROTEUS MIRABILIS  Final   Organism ID, Bacteria STAPHYLOCOCCUS AUREUS  Final      Susceptibility   Proteus mirabilis - MIC*    AMPICILLIN <=2 SENSITIVE Sensitive     CEFAZOLIN <=4 SENSITIVE Sensitive     CEFEPIME <=0.12 SENSITIVE Sensitive     CEFTAZIDIME <=1 SENSITIVE Sensitive     CEFTRIAXONE <=0.25 SENSITIVE Sensitive     CIPROFLOXACIN <=0.25 SENSITIVE Sensitive     GENTAMICIN <=1 SENSITIVE Sensitive     IMIPENEM 4 SENSITIVE Sensitive     TRIMETH/SULFA <=20 SENSITIVE Sensitive     AMPICILLIN/SULBACTAM <=2 SENSITIVE Sensitive     PIP/TAZO  <=4 SENSITIVE Sensitive     * RARE PROTEUS MIRABILIS   Staphylococcus aureus - MIC*    CIPROFLOXACIN <=0.5 SENSITIVE Sensitive     ERYTHROMYCIN <=0.25 SENSITIVE Sensitive     GENTAMICIN <=0.5 SENSITIVE Sensitive     OXACILLIN 0.5 SENSITIVE Sensitive     TETRACYCLINE <=1 SENSITIVE Sensitive     VANCOMYCIN 1 SENSITIVE Sensitive     TRIMETH/SULFA <=10 SENSITIVE Sensitive     CLINDAMYCIN <=0.25 SENSITIVE Sensitive     RIFAMPIN <=0.5 SENSITIVE Sensitive     Inducible Clindamycin NEGATIVE Sensitive     * FEW STAPHYLOCOCCUS AUREUS  Surgical pcr screen     Status: Abnormal   Collection Time: 12/10/21  3:32 PM   Specimen: Nasal Mucosa; Nasal Swab  Result Value Ref Range Status   MRSA, PCR NEGATIVE NEGATIVE Final   Staphylococcus aureus POSITIVE (A) NEGATIVE Final    Comment: (NOTE) The Xpert SA Assay (FDA approved for NASAL specimens in patients 58 years of age and older), is one component of a comprehensive surveillance program. It is not intended to diagnose infection nor to guide or monitor treatment. Performed at [facility], [address], [place], [place] [address]   Aerobic/Anaerobic Culture w Gram Stain (surgical/deep wound)     Status: None (Preliminary result)   Collection Time: 12/12/21  8:00 AM   Specimen: PATH Other; Tissue  Result Value Ref Range Status   Specimen Description   Final    TOE THIRD METATARSAL HEAD Performed at [facility], [address], [place], [place] [address]    Special Requests   Final    NONE Performed at [facility], [address] [place], [place] [address]    Gram Stain   Final    RARE WBC PRESENT, PREDOMINANTLY PMN NO ORGANISMS SEEN Performed at [facility], [place] [address], [place], [place] [address]    Culture   Final    RARE STAPHYLOCOCCUS AUREUS SUSCEPTIBILITIES TO FOLLOW NO ANAEROBES ISOLATED; CULTURE IN PROGRESS FOR 5 DAYS    Report Status PENDING  Incomplete         Radiology  Studies: No results found.      Scheduled Meds:  amLODipine  5 mg Oral Daily   ascorbic acid  500 mg Oral Daily   atorvastatin  40 mg Oral QHS   buPROPion  150 mg Oral Daily   carvedilol  3.125 mg Oral BID WC   vitamin D3  2,000 Units Oral Daily   DULoxetine  60 mg Oral Daily   levothyroxine  75 mcg Oral Q0600   lisdexamfetamine  40 mg Oral q morning   metaxalone  1,600 mg Oral TID  multivitamin with minerals  1 tablet Oral Daily   nutrition supplement (JUVEN)  1 packet Oral BID BM   polyethylene glycol  17 g Oral Daily   Ensure Max Protein  11 oz Oral QHS   senna-docusate  1 tablet Oral BID   sodium chloride flush  3 mL Intravenous Q12H   zinc sulfate  220 mg Oral Daily   Continuous Infusions:  sodium chloride     ampicillin-sulbactam (UNASYN) IV 3 g (12/15/21 1001)     LOS: 5 days       Sidney Ace, MD [facility]   If 7PM-7AM, please contact night-coverage  12/15/2021, 10:18 AM

## 2021-12-15 NOTE — Treatment Plan (Signed)
Diagnosis: Left foot infection Baseline Creatinine <1  Culture Result: MSSA and proteus  Allergies  Allergen Reactions   Lisinopril Cough    OPAT Orders Discharge antibiotics: Cefazolin 2 grams IV every 8 hours for 4 weeks End date 01/09/22  [facility] Per Protocol:  Labs weekly while on IV antibiotics: _X_ CBC with differential __ BMP _X_ CMP _X_ CRP _X_ ESR   _X_ Please pull PIC at completion of IV antibiotics _Fax weekly lab results  promptly to [phone]  Clinic Follow Up Appt: 9/19 at 10.45Am   Call [phone] with any questions

## 2021-12-15 NOTE — Progress Notes (Signed)
Met with the patient in the room He lives at home with his wife He confirms that this is not with Workers comp  He will have IV ABX teaching with [employer] after PICC line placed I explained the process to him ]He stated understanding He has crutches and a wheelchair at home and requested a Knee scooter [employer] to deliver He will have [place] for Nursing
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
## 2021-12-15 NOTE — Progress Notes (Signed)
PHARMACY CONSULT NOTE FOR:  OUTPATIENT  PARENTERAL ANTIBIOTIC THERAPY (OPAT)  Indication: MSSA and proteus foot infection Regimen: Cefazolin 2gm IV q8h End date: 01/09/2022  Labs: CBC/diff, CMP, ESR, and CRP weekly Please pull PIC at completion of IV antibiotics Fax weekly lab results  promptly to [phone]   IV antibiotic discharge orders are pended. To discharging provider:  please sign these orders via discharge navigator,  Select New Orders & click on the button choice - Manage This Unsigned Work.     Thank you for allowing pharmacy to be a part of this patient's care.  Doreene Eland, PharmD, BCPS, BCIDP Work Cell: [phone] 12/15/2021 2:55 PM

## 2021-12-15 NOTE — Plan of Care (Signed)
Problem: Education: Goal: Knowledge of General Education information will improve Description: Including pain rating scale, medication(s)/side effects and non-pharmacologic comfort measures 12/15/2021 1901 by Imaan Padgett Bet, LPN Outcome: Adequate for Discharge 12/15/2021 1901 by Miroslava Santellan Bet, LPN Outcome: Progressing   Problem: ealth Behavior/Discharge Planning: Goal: Ability to manage health-related needs will improve 12/15/2021 1901 by Ashvin Adelson Bet, LPN Outcome: Adequate for Discharge 12/15/2021 1901 by Telly Jawad Bet, LPN Outcome: Progressing   Problem: Clinical Measurements: Goal: Ability to maintain clinical measurements within normal limits will improve 12/15/2021 1901 by Demetre Monaco Bet, LPN Outcome: Adequate for Discharge 12/15/2021 1901 by Kenshawn Maciolek Bet, LPN Outcome: Progressing Goal: Will remain free from infection 12/15/2021 1901 by Vaibhav Fogleman Bet, LPN Outcome: Adequate for Discharge 12/15/2021 1901 by Byren Pankow Bet, LPN Outcome: Progressing Goal: Diagnostic test results will improve 12/15/2021 1901 by Louden ouseworth Bet, LPN Outcome: Adequate for Discharge 12/15/2021 1901 by Tamanna Whitson Bet, LPN Outcome: Progressing Goal: Respiratory complications will improve 12/15/2021 1901 by Jaymere Alen Bet, LPN Outcome: Adequate for Discharge 12/15/2021 1901 by Nastassja Witkop Bet, LPN Outcome: Progressing Goal: Cardiovascular complication will be avoided 12/15/2021 1901 by , Waylan Busta Bet, LPN Outcome: Adequate for Discharge 12/15/2021 1901 by Rhys Anchondo Bet, LPN Outcome: Progressing   Problem: Activity: Goal: Risk for activity intolerance will decrease 12/15/2021 1901 by , Tallon Gertz Bet, LPN Outcome: Adequate for Discharge 12/15/2021 1901 by Kerin Cecchi Bet, LPN Outcome: Progressing   Problem: Nutrition: Goal: Adequate nutrition will be maintained 12/15/2021 1901 by Traniyah allett Bet, LPN Outcome: Adequate for Discharge 12/15/2021 1901 by Daimon Kean Bet, LPN Outcome: Progressing    Problem: Coping: Goal: Level of anxiety will decrease 12/15/2021 1901 by Aleksa Catterton Bet, LPN Outcome: Adequate for Discharge 12/15/2021 1901 by Rondle Lohse Bet, LPN Outcome: Progressing   Problem: Elimination: Goal: Will not experience complications related to bowel motility 12/15/2021 1901 by Alfreida Steffenhagen Bet, LPN Outcome: Adequate for Discharge 12/15/2021 1901 by Coye Dawood Bet, LPN Outcome: Progressing Goal: Will not experience complications related to urinary retention 12/15/2021 1901 by Kaegan ettich Bet, LPN Outcome: Adequate for Discharge 12/15/2021 1901 by Elenor Wildes Bet, LPN Outcome: Progressing   Problem: Pain Managment: Goal: General experience of comfort will improve 12/15/2021 1901 by Birttany Dechellis Bet, LPN Outcome: Adequate for Discharge 12/15/2021 1901 by Lavere Stork Bet, LPN Outcome: Progressing   Problem: Safety: Goal: Ability to remain free from injury will improve 12/15/2021 1901 by Wisdom Seybold Bet, LPN Outcome: Adequate for Discharge 12/15/2021 1901 by Noor Vidales Bet, LPN Outcome: Progressing   Problem: Skin Integrity: Goal: Risk for impaired skin integrity will decrease 12/15/2021 1901 by Tayra Dawe Bet, LPN Outcome: Adequate for Discharge 12/15/2021 1901 by Nikki Rusnak Bet, LPN Outcome: Progressing   Problem: Education: Goal: Ability to describe self-care measures that may prevent or decrease complications (Diabetes Survival Skills Education) will improve 12/15/2021 1901 by Jasper Loser,  Bet, LPN Outcome: Adequate for Discharge 12/15/2021 1901 by Xochilt Conant Bet, LPN Outcome: Progressing Goal: Individualized Educational Video(s) 12/15/2021 1901 by Jasper Loser,  Bet, LPN Outcome: Adequate for Discharge 12/15/2021 1901 by Lanorris Kalisz Bet, LPN Outcome: Progressing   Problem: Coping: Goal: Ability to adjust to condition or change in health will improve 12/15/2021 1901 by Kynedi Profitt Bet, LPN Outcome: Adequate for Discharge 12/15/2021 1901 by Adamarie Izzo Bet, LPN Outcome:  Progressing   Problem: Fluid Volume: Goal: Ability to maintain a balanced intake and output will improve 12/15/2021 1901 by Maurisa Tesmer Bet, LPN Outcome: Adequate for Discharge 12/15/2021 1901 by Jasper Loser,  Anni Hocevar Bet, LPN Outcome: Progressing   Problem: Health Behavior/Discharge Planning: Goal: Ability to identify and utilize available resources and services will improve 12/15/2021 1901 by Acsa Estey Bet, LPN Outcome: Adequate for Discharge 12/15/2021 1901 by Devyn Griffing Bet, LPN Outcome: Progressing Goal: Ability to manage health-related needs will improve 12/15/2021 1901 by Overton Boggus Bet, LPN Outcome: Adequate for Discharge 12/15/2021 1901 by Emmajane Altamura Bet, LPN Outcome: Progressing   Problem: Metabolic: Goal: Ability to maintain appropriate glucose levels will improve 12/15/2021 1901 by Kynadie Yaun Bet, LPN Outcome: Adequate for Discharge 12/15/2021 1901 by Neola Worrall Bet, LPN Outcome: Progressing   Problem: Nutritional: Goal: Maintenance of adequate nutrition will improve 12/15/2021 1901 by Juandavid Dallman Bet, LPN Outcome: Adequate for Discharge 12/15/2021 1901 by Imane Burrough Bet, LPN Outcome: Progressing Goal: Progress toward achieving an optimal weight will improve 12/15/2021 1901 by Ayla Dunigan Bet, LPN Outcome: Adequate for Discharge 12/15/2021 1901 by Ryleah Miramontes Bet, LPN Outcome: Progressing   Problem: Skin Integrity: Goal: Risk for impaired skin integrity will decrease 12/15/2021 1901 by Hoke Baer Bet, LPN Outcome: Adequate for Discharge 12/15/2021 1901 by Damani Kelemen Bet, LPN Outcome: Progressing   Problem: Tissue Perfusion: Goal: Adequacy of tissue perfusion will improve 12/15/2021 1901 by Brittany Amirault Bet, LPN Outcome: Adequate for Discharge 12/15/2021 1901 by Kimothy Kishimoto Bet, LPN Outcome: Progressing   Problem: Increased Nutrient Needs (NI-5.1) Goal: Food and/or nutrient delivery Description: Individualized approach for food/nutrient provision. Outcome: Adequate for Discharge

## 2021-12-15 NOTE — TOC Progression Note (Signed)
Transition of Care [facility]) - Progression Note    Patient Details  Name: Charles [name] MRN: [id number] Date of Birth: 07/27/1960  Transition of Care [facility]) CM/SW [place], RN Phone Number: 12/15/2021, 3:29 PM  Clinical Narrative:    I notified the patient that [employer] doe snot bill his insurance, the cost would be out of pocket and it si almost 300$, he stated that he would go to the hospice outlet and purchase a knee scooter, I notified Rhonda with [employer]   Expected Discharge Plan: [place] Barriers to Discharge: Continued Medical Work up  Expected Discharge Plan and Services Expected Discharge Plan: [place]   Discharge Planning Services: CM Consult   Living arrangements for the past 2 months: Single Family Home                 DME Arranged:  (knee scooter) DME Agency: [employer] Date DME Agency Contacted: 12/15/21 Time DME Agency Contacted: [phone] Representative spoke with at DME Agency: Suanne Marker HH Arranged: RN [place] Agency:  ([place]) Date [place]: 12/15/21 Time [place]: 1444 Representative spoke with at [place]: [place] ([place]) Interventions    Readmission Risk Interventions    10/22/2021    1:14 PM 08/31/2021    9:49 AM  Readmission Risk Prevention Plan  Transportation Screening Complete Complete  PCP or Specialist Appt within 3-5 Days Complete Complete  HRI or [place]  Complete  Social Work Consult for [place] Planning/Counseling Complete Complete  Palliative Care Screening Not Applicable Not Applicable  Medication Review Press photographer) Complete Complete

## 2021-12-15 NOTE — Progress Notes (Signed)
3 Days Post-Op   Subjective/Chief Complaint: Patient seen.  No complaints.   Objective: Vital signs in last 24 hours: Temp:  [97.6 F (36.4 C)-98 F (36.7 C)] 98 F (36.7 C) (08/28 0927) Pulse Rate:  [51-72] 59 (08/28 0927) Resp:  [16-17] 16 (08/28 0927) BP: (121-161)/(67-83) 161/82 (08/28 0927) SpO2:  [96 %-98 %] 98 % (08/28 0927) Last BM Date : 12/14/21  Intake/Output from previous day: 08/27 0701 - 08/28 0700 In: 1800 [P.O.:1800] Out: 1450 [Urine:1450] Intake/Output this shift: Total I/O In: 240 [P.O.:240] Out: -   Still moderate strikethrough on the bandaging on the left foot.  Upon removal the incision is well coapted with some superficial skin slough and drainage from the dorsal flap.  No significant cellulitis or any signs of purulence.     Lab Results:  No results for input(s): "WBC", "HGB", "HCT", "PLT" in the last 72 hours. BMET No results for input(s): "NA", "K", "CL", "CO2", "GLUCOSE", "BUN", "CREATININE", "CALCIUM" in the last 72 hours. PT/INR No results for input(s): "LABPROT", "INR" in the last 72 hours. ABG No results for input(s): "PHART", "HCO3" in the last 72 hours.  Invalid input(s): "PCO2", "PO2"  Studies/Results: [place] EKG SITE RITE  Result Date: 12/15/2021 If Site Rite image not attached, placement could not be confirmed due to current cardiac rhythm.   Anti-infectives: Anti-infectives (From admission, onward)    Start     Dose/Rate Route Frequency Ordered Stop   12/12/21 1800  Ampicillin-Sulbactam (UNASYN) 3 g in sodium chloride 0.9 % 100 mL IVPB        3 g 200 mL/hr over 30 Minutes Intravenous Every 6 hours 12/12/21 1225     12/12/21 0814  vancomycin (VANCOCIN) powder  Status:  Discontinued          As needed 12/12/21 0814 12/12/21 0856   12/10/21 1600  cefTRIAXone (ROCEPHIN) 2 g in sodium chloride 0.9 % 100 mL IVPB  Status:  Discontinued       See Hyperspace for full Linked Orders Report.   2 g 200 mL/hr over 30 Minutes Intravenous  Every 24 hours 12/10/21 0958 12/12/21 1225   12/10/21 1000  metroNIDAZOLE (FLAGYL) IVPB 500 mg  Status:  Discontinued       See Hyperspace for full Linked Orders Report.   500 mg 100 mL/hr over 60 Minutes Intravenous Every 12 hours 12/10/21 0958 12/12/21 1225   12/10/21 0745  ceFEPIme (MAXIPIME) 2 g in sodium chloride 0.9 % 100 mL IVPB        2 g 200 mL/hr over 30 Minutes Intravenous  Once 12/10/21 0737 12/10/21 0816   12/10/21 0745  vancomycin (VANCOCIN) IVPB 1000 mg/200 mL premix        1,000 mg 200 mL/hr over 60 Minutes Intravenous  Once 12/10/21 0737 12/10/21 0904       Assessment/Plan: s/p Procedure(s): TRANSMETATARSAL AMPUTATION (Left) Assessment: Stable status post transmetatarsal amputation left foot.  Plan: Betadine gauze and a sterile bandage reapplied to the left foot.  Seen in conjunction with infectious disease.  Patient may benefit from 2 to 3 weeks of IV antibiotics and then switch to oral.  Patient may be discharged from podiatry standpoint with follow-up at the end of this week.  Instructed the patient to keep the bandage clean, dry, and do not remove unless it bleeds through at which point he can reapply Betadine and a fresh bandage which he is comfortable doing.  Instructed the patient to be nonweightbearing on the left foot for now.  LOS: 5 days    Durward Fortes 12/15/2021

## 2021-12-15 NOTE — Progress Notes (Signed)
Reviewed discharge instructions with pt. Pt verbalized  understanding. PICC line in place. Pt discharged with all personal belongings.  Staff wheeled pt out. Pt transported to home via family vehicle.

## 2021-12-15 NOTE — Discharge Summary (Signed)
Physician Discharge Summary  COLIE FUGITT VFI:[id number] DOB: 1960-11-05 DOA: 12/10/2021  PCP: Mylinda Latina, PA-C  Admit date: 12/10/2021 Discharge date: 12/15/2021  Admitted From: Home Disposition:  Home with home health  Recommendations for Outpatient Follow-up:  Follow up with PCP in 1-2 weeks Follow up ID as directed Follow up podiatry as directed  Home Health:Yes RN  Equipment/Devices:None   Discharge Condition:Stable  CODE STATUS:FULL  Diet recommendation: Reg  Brief/Interim Summary: 61 y.o. male with medical history significant for obesity, depression, hypertension, anxiety, hypothyroidism, status post multiple toe amputations in July.,2023 by Dr. Vickki Muff for osteomyelitis with gangrenous changes of left toes.   Patient presents to the ER today for evaluation of  left foot pain and ulceration with purulent drainage . He had a foot x ray done at his podiatrist's office which showed  interval pathological fracture of the distal third metatarsal at the  metatarsal head region.  Osteopenia of the distal second metatarsal is noted with areas of air within the soft tissue of the second metatarsal head.  This consistent with history of ulceration.  He was advised to go to the ER by his podiatrist for further evaluation. He denies having any fever, no chills, no chest pain, no shortness of breath, no abdominal pain, no nausea, no vomiting, no changes in his bowel habits, no urinary symptoms, no blurred vision or focal deficit. Left foot x-ray showed interval resection of second through fourth toes with soft tissue lucency surrounding the heads of the third and fourth metatarsals likely corresponding to the reported ulceration. There is cortical irregularity with erosive change at the distal metaphysis of third metatarsal raising concern for osteomyelitis. Patient will be admitted to the hospital for IV antibiotics and podiatry will be consulted.   Podiatry consulted.  Patient status  post transmetatarsal amputation on 8/25.  Postoperative course uncomplicated.  Rare Staph aureus and proteus spp  growing on surgical cultures.  Seen by ID.  PICC line placed.  OPAT orders in.  Ancef 2g q8h x 4 weeks.  End date 9/22.  PICC line placed, education done.  Patient discharged home in stable condition.    Discharge Diagnoses:  Principal Problem:   Osteomyelitis of ankle or foot, acute, left (HCC) Active Problems:   Major depression, chronic   Hypothyroidism   Essential hypertension   Obesity (BMI 30-39.9)   Anemia of chronic disease    Osteomyelitis of ankle or foot, acute, left (HCC) Patient was sent to the ER for evaluation of left foot pain and ulceration of the plantar surface of the left foot with purulent drainage and imaging  is suggestive of osteomyelitis. Patient has a history of neuropathic foot ulcer and has had multiple amputations Not diabetic Status post TMA 8/25 Postoperative pain mild Plan: DC home.  PICC line in place.  OPAT orders in Ancef 2g IV q8h x 4 weeks FU OP ID, podiatry, PCP   Major depression, chronic Continue PTA antidepressant regimen   Hypothyroidism Stable Continue Synthroid   Essential hypertension Blood pressure is stable Continue carvedilol and amlodipine Ensure pain control     Anemia of chronic disease H&H is stable\   Obesity (BMI 30-39.9) BMI 41.66 Complicates overall prognosis and care Patient exercise has been discussed with patient in detail   Chronic pain syndrome Home pain regimen reinitiated pharmacy dosing assistance  Discharge Instructions  Discharge Instructions     [facility] pharmacist to adjust dose for Vancomycin, Aminoglycosides and other anti-infective therapies as requested by physician.  Complete by: As directed    [facility] to provide Cath Flo 2mg    Complete by: As directed    Administer for PICC line occlusion and as ordered by physician for other access device  issues.   Anaphylaxis Kit: Provided to treat any anaphylactic reaction to the medication being provided to the patient if First Dose or when requested by physician   Complete by: As directed    Epinephrine 1mg /ml vial / amp: Administer 0.3mg  (0.59ml) subcutaneously once for moderate to severe anaphylaxis, nurse to call physician and pharmacy when reaction occurs and call 911 if needed for immediate care   Diphenhydramine 50mg /ml IV vial: Administer 25-50mg  IV/IM PRN for first dose reaction, rash, itching, mild reaction, nurse to call physician and pharmacy when reaction occurs   Sodium Chloride 0.9% NS 527ml IV: Administer if needed for hypovolemic blood pressure drop or as ordered by physician after call to physician with anaphylactic reaction   Change dressing on IV access line weekly and PRN   Complete by: As directed    Diet - low sodium heart healthy   Complete by: As directed    Flush IV access with Sodium Chloride 0.9% and Heparin 10 units/ml or 100 units/ml   Complete by: As directed    Home infusion instructions - [facility]   Complete by: As directed    Instructions: Flush IV access with Sodium Chloride 0.9% and Heparin 10units/ml or 100units/ml   Change dressing on IV access line: Weekly and PRN   Instructions Cath Flo 2mg : Administer for PICC Line occlusion and as ordered by physician for other access device   [facility] pharmacist to adjust dose for: Vancomycin, Aminoglycosides and other anti-infective therapies as requested by physician   Increase activity slowly   Complete by: As directed    Method of administration may be changed at the discretion of home infusion pharmacist based upon assessment of the patient and/or caregiver's ability to self-administer the medication ordered   Complete by: As directed    No wound care   Complete by: As directed       Allergies as of 12/15/2021       Reactions   Lisinopril Cough        Medication List      STOP taking these medications    busPIRone 30 MG tablet Commonly known as: BUSPAR   doxycycline 100 MG tablet Commonly known as: VIBRA-TABS       TAKE these medications    albuterol 108 (90 Base) MCG/ACT inhaler Commonly known as: VENTOLIN HFA Inhale 2 puffs into the lungs every 6 (six) hours as needed for wheezing or shortness of breath.   amLODipine 5 MG tablet Commonly known as: NORVASC Take 1 tablet (5 mg total) by mouth daily.   ascorbic acid 500 MG tablet Commonly known as: VITAMIN C Take 1 tablet (500 mg total) by mouth daily.   aspirin EC 81 MG tablet Take 81 mg by mouth daily. Swallow whole.   atorvastatin 40 MG tablet Commonly known as: LIPITOR Take one tab at bed time for cholesterol   buPROPion 150 MG 24 hr tablet Commonly known as: Wellbutrin XL Take 1 tablet (150 mg total) by mouth daily.   carvedilol 3.125 MG tablet Commonly known as: COREG TAKE ONE (1) TABLET BY MOUTH TWO TIMES PER DAY WITH MEALS   ceFAZolin  IVPB Commonly known as: ANCEF Inject 2 g into the vein every 8 (eight) hours for 25 days. Indication:  MSSA and proteus foot infection First Dose: Yes Last Day of Therapy:  01/09/2022 Labs - Once weekly:  CBC/D, CMP, ESR and CRP Method of administration: IV Push Please pull PIC at completion of IV antibiotics Fax weekly lab results  promptly to [phone] Method of administration may be changed at the discretion of home infusion pharmacist based upon assessment of the patient and/or caregiver's ability to self-administer the medication ordered.   diclofenac 50 MG tablet Commonly known as: CATAFLAM Take 100 mg by mouth every 4 (four) hours.   DULoxetine 60 MG capsule Commonly known as: CYMBALTA TAKE 1 CAPSULE BY MOUTH DAILY.   furosemide 20 MG tablet Commonly known as: LASIX Take 1 tablet by mouth daily for swelling. May take second tablet if needed for increased swelling.   levothyroxine 75 MCG tablet Commonly known as:  SYNTHROID TAKE ONE TABLET BY MOUTH EVERY DAY WITH BREAKFAST   lisdexamfetamine 40 MG capsule Commonly known as: Vyvanse Take 1 capsule (40 mg total) by mouth every morning.   lisdexamfetamine 40 MG capsule Commonly known as: Vyvanse Take 1 capsule (40 mg total) by mouth every morning. Start taking on: January 02, 2022   metaxalone 800 MG tablet Commonly known as: SKELAXIN Take 1,600 mg by mouth 3 (three) times daily.   oxyCODONE 5 MG immediate release tablet Commonly known as: Oxy IR/ROXICODONE Take 10 mg by mouth every 4 (four) hours as needed for severe pain. What changed: Another medication with the same name was removed. Continue taking this medication, and follow the directions you see here.   Oxycodone HCl 10 MG Tabs Take 10 mg by mouth every 4 (four) hours. What changed: Another medication with the same name was removed. Continue taking this medication, and follow the directions you see here.   sildenafil 20 MG tablet Commonly known as: REVATIO Take 1 tablet (20 mg total) by mouth daily as needed (ED).   vitamin D3 25 MCG tablet Commonly known as: CHOLECALCIFEROL Take 2 tablets (2,000 Units total) by mouth daily.   zinc sulfate 220 (50 Zn) MG capsule Take 1 capsule (220 mg total) by mouth daily.   zolpidem 10 MG tablet Commonly known as: AMBIEN TAKE 1 TABLET BY MOUTH AT BEDTIME AS NEEDED FOR SLEEP               Durable Medical Equipment  (From admission, onward)           Start     Ordered   12/15/21 1439  For home use only DME Other see comment  Once       Comments: Knee Scooter  Question:  Length of Need  Answer:  6 Months   12/15/21 1442              Discharge Care Instructions  (From admission, onward)           Start     Ordered   12/15/21 0000  Change dressing on IV access line weekly and PRN  (Home infusion instructions - [facility] )        12/15/21 1647            Allergies  Allergen Reactions    Lisinopril Cough    Consultations: Podiatry ID    Procedures/Studies: [place] EKG SITE RITE  Result Date: 12/15/2021 If Site Rite image not attached, placement could not be confirmed due to current cardiac rhythm.  [place] OR NERVE BLOCK-IMAGE ONLY [facility])  Result Date: 12/12/2021 There is no interpretation for  this exam.  This order is for images obtained during a surgical procedure.  Please See "Surgeries" Tab for more information regarding the procedure.   DG Foot Complete Left  Result Date: 12/10/2021 CLINICAL DATA:  Ulceration and pain to plantar surface. Recent amputation. EXAM: LEFT FOOT - COMPLETE 3+ VIEW COMPARISON:  Foot radiographs 10/19/2021 FINDINGS: The patient is status post amputation of the second through fourth toes, new since 10/19/2021. Amputation of the great toe and fifth ray from the distal aspect of the metatarsal There is soft tissue lucency centered around the heads of the third and fourth metatarsals likely corresponding to the reported ulceration. There is cortical irregularity with erosion along the distal metaphysis of the third metatarsal raising concern for osteomyelitis. No other definite osseous erosion is seen. There is marked soft tissue swelling about the distal end of the foot. There is inferior calcaneal spurring. IMPRESSION: Interval resection of second through fourth toes with soft tissue lucency surrounding the heads of the third and fourth metatarsals likely corresponding to the reported ulceration. There is cortical irregularity with erosive change at the distal metaphysis of third metatarsal raising concern for osteomyelitis. Electronically Signed   By: Valetta Mole M.D.   On: 12/10/2021 08:07      Subjective: Seen and examined on the day of dc.  Stable, pain controlled Appropriate for DC home with [facility] services  Discharge Exam: Vitals:   12/15/21 0404 12/15/21 0927  BP: (!) 140/83 (!) 161/82  Pulse: (!) 51 (!) 59  Resp: 17 16  Temp: 97.7 F (36.5 C)  98 F (36.7 C)  SpO2: 98% 98%   Vitals:   12/14/21 1513 12/14/21 2116 12/15/21 0404 12/15/21 0927  BP: (!) 147/82 121/67 (!) 140/83 (!) 161/82  Pulse: 69 72 (!) 51 (!) 59  Resp: $Remo'16 17 17 16  'ecWno$ Temp: 97.6 F (36.4 C) 97.8 F (36.6 C) 97.7 F (36.5 C) 98 F (36.7 C)  TempSrc:      SpO2: 97% 96% 98% 98%  Weight:      Height:        General: Pt is alert, awake, not in acute distress Cardiovascular: RRR, S1/S2 +, no rubs, no gallops Respiratory: CTA bilaterally, no wheezing, no rhonchi Abdominal: Soft, NT, ND, bowel sounds + Extremities: left foot s/p TMA    The results of significant diagnostics from this hospitalization (including imaging, microbiology, ancillary and laboratory) are listed below for reference.     Microbiology: Recent Results (from the past 240 hour(s))  Aerobic/Anaerobic Culture w Gram Stain (surgical/deep wound)     Status: None   Collection Time: 12/09/21 10:30 AM   Specimen: Wound  Result Value Ref Range Status   Specimen Description   Final    WOUND Performed at [facility], [address], [place], [place] [address]    Special Requests   Final    LEFT FOOT Performed at [facility], [address] [place], [place] [address]    Gram Stain   Final    RARE WBC PRESENT,BOTH PMN AND MONONUCLEAR FEW GRAM POSITIVE COCCI    Culture   Final    FEW STAPHYLOCOCCUS AUREUS RARE PROTEUS MIRABILIS NO ANAEROBES ISOLATED Performed at [facility], [place] [address] [place], [place] [address]    Report Status 12/14/2021 FINAL  Final   Organism ID, Bacteria PROTEUS MIRABILIS  Final   Organism ID, Bacteria STAPHYLOCOCCUS AUREUS  Final      Susceptibility   Proteus mirabilis - MIC*  AMPICILLIN <=2 SENSITIVE Sensitive     CEFAZOLIN <=4 SENSITIVE Sensitive     CEFEPIME <=0.12 SENSITIVE Sensitive     CEFTAZIDIME <=1 SENSITIVE Sensitive     CEFTRIAXONE <=0.25 SENSITIVE Sensitive     CIPROFLOXACIN <=0.25 SENSITIVE Sensitive      GENTAMICIN <=1 SENSITIVE Sensitive     IMIPENEM 4 SENSITIVE Sensitive     TRIMETH/SULFA <=20 SENSITIVE Sensitive     AMPICILLIN/SULBACTAM <=2 SENSITIVE Sensitive     PIP/TAZO <=4 SENSITIVE Sensitive     * RARE PROTEUS MIRABILIS   Staphylococcus aureus - MIC*    CIPROFLOXACIN <=0.5 SENSITIVE Sensitive     ERYTHROMYCIN <=0.25 SENSITIVE Sensitive     GENTAMICIN <=0.5 SENSITIVE Sensitive     OXACILLIN 0.5 SENSITIVE Sensitive     TETRACYCLINE <=1 SENSITIVE Sensitive     VANCOMYCIN 1 SENSITIVE Sensitive     TRIMETH/SULFA <=10 SENSITIVE Sensitive     CLINDAMYCIN <=0.25 SENSITIVE Sensitive     RIFAMPIN <=0.5 SENSITIVE Sensitive     Inducible Clindamycin NEGATIVE Sensitive     * FEW STAPHYLOCOCCUS AUREUS  Surgical pcr screen     Status: Abnormal   Collection Time: 12/10/21  3:32 PM   Specimen: Nasal Mucosa; Nasal Swab  Result Value Ref Range Status   MRSA, PCR NEGATIVE NEGATIVE Final   Staphylococcus aureus POSITIVE (A) NEGATIVE Final    Comment: (NOTE) The Xpert SA Assay (FDA approved for NASAL specimens in patients 67 years of age and older), is one component of a comprehensive surveillance program. It is not intended to diagnose infection nor to guide or monitor treatment. Performed at [facility], [address] [place], [place] [address]   Aerobic/Anaerobic Culture w Gram Stain (surgical/deep wound)     Status: None (Preliminary result)   Collection Time: 12/12/21  8:00 AM   Specimen: PATH Other; Tissue  Result Value Ref Range Status   Specimen Description   Final    TOE THIRD METATARSAL HEAD Performed at [facility], [address], [place], [place] [address]    Special Requests   Final    NONE Performed at [facility], [address] [place], [place] [address]    Gram Stain   Final    RARE WBC PRESENT, PREDOMINANTLY PMN NO ORGANISMS SEEN Performed at [facility], [place] [address], [place], [place] [address]    Culture   Final     RARE STAPHYLOCOCCUS AUREUS NO ANAEROBES ISOLATED; CULTURE IN PROGRESS FOR 5 DAYS    Report Status PENDING  Incomplete   Organism ID, Bacteria STAPHYLOCOCCUS AUREUS  Final      Susceptibility   Staphylococcus aureus - MIC*    CIPROFLOXACIN <=0.5 SENSITIVE Sensitive     ERYTHROMYCIN <=0.25 SENSITIVE Sensitive     GENTAMICIN <=0.5 SENSITIVE Sensitive     OXACILLIN 0.5 SENSITIVE Sensitive     TETRACYCLINE <=1 SENSITIVE Sensitive     VANCOMYCIN <=0.5 SENSITIVE Sensitive     TRIMETH/SULFA <=10 SENSITIVE Sensitive     CLINDAMYCIN <=0.25 SENSITIVE Sensitive     RIFAMPIN <=0.5 SENSITIVE Sensitive     Inducible Clindamycin NEGATIVE Sensitive     * RARE STAPHYLOCOCCUS AUREUS     Labs: BNP (last 3 results) No results for input(s): "BNP" in the last 8760 hours. Basic Metabolic Panel: Recent Labs  Lab 12/10/21 0709 12/11/21 0425  NA 140 142  K 3.9 4.1  CL 108 111  CO2 27 28  GLUCOSE 129* 111*  BUN 22 16  CREATININE 1.01 0.94  CALCIUM 8.7* 8.7*   Liver Function Tests: No results for input(s): "AST", "ALT", "ALKPHOS", "BILITOT", "PROT", "ALBUMIN" in the last 168 hours. No results for input(s): "LIPASE", "AMYLASE" in the last 168 hours. No results for input(s): "AMMONIA" in the last 168 hours. CBC: Recent Labs  Lab 12/10/21 0709 12/11/21 0425  WBC 6.9 5.6  NEUTROABS 3.7  --   HGB 10.2* 9.9*  HCT 32.1* 31.6*  MCV 89.2 90.5  PLT 471* 433*   Cardiac Enzymes: No results for input(s): "CKTOTAL", "CKMB", "CKMBINDEX", "TROPONINI" in the last 168 hours. BNP: Invalid input(s): "POCBNP" CBG: Recent Labs  Lab 12/11/21 0800  GLUCAP 104*   D-Dimer No results for input(s): "DDIMER" in the last 72 hours. Hgb A1c No results for input(s): "HGBA1C" in the last 72 hours. Lipid Profile No results for input(s): "CHOL", "HDL", "LDLCALC", "TRIG", "CHOLHDL", "LDLDIRECT" in the last 72 hours. Thyroid function studies No results for input(s): "TSH", "T4TOTAL", "T3FREE", "THYROIDAB" in the  last 72 hours.  Invalid input(s): "FREET3" Anemia work up No results for input(s): "VITAMINB12", "FOLATE", "FERRITIN", "TIBC", "IRON", "RETICCTPCT" in the last 72 hours. Urinalysis    Component Value Date/Time   COLORURINE YELLOW (A) 10/19/2021 2216   APPEARANCEUR CLEAR (A) 10/19/2021 2216   APPEARANCEUR Cloudy (A) 11/21/2020 1106   LABSPEC 1.014 10/19/2021 2216   LABSPEC 1.004 01/27/2013 2020   PHURINE 5.0 10/19/2021 2216   GLUCOSEU NEGATIVE 10/19/2021 2216   GLUCOSEU Negative 01/27/2013 2020   HGBUR NEGATIVE 10/19/2021 2216   [place] NEGATIVE 10/19/2021 2216   BILIRUBINUR Negative 11/21/2020 1106   BILIRUBINUR Negative 01/27/2013 2020   KETONESUR NEGATIVE 10/19/2021 2216   PROTEINUR NEGATIVE 10/19/2021 2216   NITRITE NEGATIVE 10/19/2021 2216   LEUKOCYTESUR NEGATIVE 10/19/2021 2216   LEUKOCYTESUR Negative 01/27/2013 2020   Sepsis Labs Recent Labs  Lab 12/10/21 0709 12/11/21 0425  WBC 6.9 5.6   Microbiology Recent Results (from the past 240 hour(s))  Aerobic/Anaerobic Culture w Gram Stain (surgical/deep wound)     Status: None   Collection Time: 12/09/21 10:30 AM   Specimen: Wound  Result Value Ref Range Status   Specimen Description   Final    WOUND Performed at [facility], [address] [place], [place] [address]    Special Requests   Final    LEFT FOOT Performed at [facility], [address] [place], [place] [address]    Gram Stain   Final    RARE WBC PRESENT,BOTH PMN AND MONONUCLEAR FEW GRAM POSITIVE COCCI    Culture   Final    FEW STAPHYLOCOCCUS AUREUS RARE PROTEUS MIRABILIS NO ANAEROBES ISOLATED Performed at [facility], [place] [address], [place], [place] [address]    Report Status 12/14/2021 FINAL  Final   Organism ID, Bacteria PROTEUS MIRABILIS  Final   Organism ID, Bacteria STAPHYLOCOCCUS AUREUS  Final      Susceptibility   Proteus mirabilis - MIC*    AMPICILLIN <=2 SENSITIVE Sensitive     CEFAZOLIN <=4  SENSITIVE Sensitive     CEFEPIME <=0.12 SENSITIVE Sensitive     CEFTAZIDIME <=1 SENSITIVE Sensitive     CEFTRIAXONE <=0.25 SENSITIVE Sensitive     CIPROFLOXACIN <=0.25 SENSITIVE Sensitive     GENTAMICIN <=1 SENSITIVE Sensitive     IMIPENEM 4 SENSITIVE Sensitive     TRIMETH/SULFA <=20 SENSITIVE Sensitive     AMPICILLIN/SULBACTAM <=2 SENSITIVE Sensitive     PIP/TAZO <=4 SENSITIVE Sensitive     * RARE PROTEUS MIRABILIS  Staphylococcus aureus - MIC*    CIPROFLOXACIN <=0.5 SENSITIVE Sensitive     ERYTHROMYCIN <=0.25 SENSITIVE Sensitive     GENTAMICIN <=0.5 SENSITIVE Sensitive     OXACILLIN 0.5 SENSITIVE Sensitive     TETRACYCLINE <=1 SENSITIVE Sensitive     VANCOMYCIN 1 SENSITIVE Sensitive     TRIMETH/SULFA <=10 SENSITIVE Sensitive     CLINDAMYCIN <=0.25 SENSITIVE Sensitive     RIFAMPIN <=0.5 SENSITIVE Sensitive     Inducible Clindamycin NEGATIVE Sensitive     * FEW STAPHYLOCOCCUS AUREUS  Surgical pcr screen     Status: Abnormal   Collection Time: 12/10/21  3:32 PM   Specimen: Nasal Mucosa; Nasal Swab  Result Value Ref Range Status   MRSA, PCR NEGATIVE NEGATIVE Final   Staphylococcus aureus POSITIVE (A) NEGATIVE Final    Comment: (NOTE) The Xpert SA Assay (FDA approved for NASAL specimens in patients 85 years of age and older), is one component of a comprehensive surveillance program. It is not intended to diagnose infection nor to guide or monitor treatment. Performed at [facility], [address], [place], [place] [address]   Aerobic/Anaerobic Culture w Gram Stain (surgical/deep wound)     Status: None (Preliminary result)   Collection Time: 12/12/21  8:00 AM   Specimen: PATH Other; Tissue  Result Value Ref Range Status   Specimen Description   Final    TOE THIRD METATARSAL HEAD Performed at [facility], [address], [place], [place] [address]    Special Requests   Final    NONE Performed at [facility], [address]  [place], [place] [address]    Gram Stain   Final    RARE WBC PRESENT, PREDOMINANTLY PMN NO ORGANISMS SEEN Performed at [facility], [place] [address] [place], [place] [address]    Culture   Final    RARE STAPHYLOCOCCUS AUREUS NO ANAEROBES ISOLATED; CULTURE IN PROGRESS FOR 5 DAYS    Report Status PENDING  Incomplete   Organism ID, Bacteria STAPHYLOCOCCUS AUREUS  Final      Susceptibility   Staphylococcus aureus - MIC*    CIPROFLOXACIN <=0.5 SENSITIVE Sensitive     ERYTHROMYCIN <=0.25 SENSITIVE Sensitive     GENTAMICIN <=0.5 SENSITIVE Sensitive     OXACILLIN 0.5 SENSITIVE Sensitive     TETRACYCLINE <=1 SENSITIVE Sensitive     VANCOMYCIN <=0.5 SENSITIVE Sensitive     TRIMETH/SULFA <=10 SENSITIVE Sensitive     CLINDAMYCIN <=0.25 SENSITIVE Sensitive     RIFAMPIN <=0.5 SENSITIVE Sensitive     Inducible Clindamycin NEGATIVE Sensitive     * RARE STAPHYLOCOCCUS AUREUS     Time coordinating discharge: Over 30 minutes  SIGNED:   Sidney Ace, MD  [facility] 12/15/2021, 4:50 PM Pager   If 7PM-7AM, please contact night-coverage

## 2021-12-15 NOTE — Progress Notes (Signed)
Peripherally Inserted Central Catheter Placement  The IV Nurse has discussed with the patient and/or persons authorized to consent for the patient, the purpose of this procedure and the potential benefits and risks involved with this procedure.  The benefits include less needle sticks, lab draws from the catheter, and the patient may be discharged home with the catheter. Risks include, but not limited to, infection, bleeding, blood clot (thrombus formation), and puncture of an artery; nerve damage and irregular heartbeat and possibility to perform a PICC exchange if needed/ordered by physician.  Alternatives to this procedure were also discussed.  Bard Power PICC patient education guide, fact sheet on infection prevention and patient information card has been provided to patient /or left at bedside.    PICC Placement Documentation  PICC Single Lumen 12/15/21 Right Brachial 40 cm 0 cm (Active)  Indication for Insertion or Continuance of Line Home intravenous therapies (PICC only) 12/15/21 1808  Exposed Catheter (cm) 0 cm 12/15/21 1808  Site Assessment Clean, Dry, Intact 12/15/21 1808  Line Status Flushed;Blood return noted 12/15/21 1808  Dressing Type Transparent 12/15/21 1808  Dressing Status Antimicrobial disc in place 12/15/21 [place] checked and tightened 12/15/21 1808  Dressing Change Due 12/22/21 12/15/21 1808       Scotty Court 12/15/2021, 6:10 PM

## 2021-12-17 LAB — AEROBIC/ANAEROBIC CULTURE W GRAM STAIN (SURGICAL/DEEP WOUND)

## 2021-12-28 ENCOUNTER — Other Ambulatory Visit: Payer: Self-pay

## 2021-12-28 ENCOUNTER — Emergency Department
Admission: EM | Admit: 2021-12-28 | Discharge: 2021-12-28 | Disposition: A | Discharge status: Home / Self Care | Payer: Managed Care, Other (non HMO) | Attending: Emergency Medicine | Admitting: Emergency Medicine

## 2021-12-28 DIAGNOSIS — Z5189 Encounter for other specified aftercare: Secondary | ICD-10-CM

## 2021-12-28 DIAGNOSIS — W1841XA Slipping, tripping and stumbling without falling due to stepping on object, initial encounter: Secondary | ICD-10-CM | POA: Diagnosis not present

## 2021-12-28 DIAGNOSIS — Z4801 Encounter for change or removal of surgical wound dressing: Secondary | ICD-10-CM | POA: Insufficient documentation

## 2021-12-28 DIAGNOSIS — T8131XA Disruption of external operation (surgical) wound, not elsewhere classified, initial encounter: Secondary | ICD-10-CM | POA: Insufficient documentation

## 2021-12-28 DIAGNOSIS — Y92009 Unspecified place in unspecified non-institutional (private) residence as the place of occurrence of the external cause: Secondary | ICD-10-CM | POA: Diagnosis not present

## 2021-12-28 MED ORDER — CLINDAMYCIN HCL 300 MG PO CAPS: 300.0000 mg | ORAL_CAPSULE | Freq: Four times a day (QID) | ORAL | 0 refills | Status: AC

## 2021-12-28 MED ORDER — CLINDAMYCIN HCL 150 MG PO CAPS
300.0000 mg | ORAL_CAPSULE | Freq: Once | ORAL | Status: AC
  Administered 2021-12-28: 300 mg via ORAL
  Filled 2021-12-28: qty 2

## 2021-12-28 NOTE — ED Triage Notes (Signed)
Pt with recent left toes amputation. Pt states tonight he injured left foot leading to wound opening and staples being ripped from wound. Bleeding controlled.

## 2021-12-28 NOTE — ED Provider Notes (Signed)
[facility] Provider Note  Patient Contact: 8:04 PM (approximate)   History   Wound Check   HPI  Charles [name] is a 61 y.o. male who presents to the emergency department for postop complication.  Patient had a transmetatarsal resection due to osteomyelitis roughly 15 days ago.  This was performed here at [facility].  Patient did have some slight dehiscence of the wound in the central area with a little bit of eschar formation that has been followed by podiatry.  Tonight patient tripped over his dog and the wound grossly dehisced.  Patient had some bleeding and a little bit of drainage from the site but was able to control this prior to arrival.  No other injury or complaint.  Patient is being currently followed by [facility] clinic podiatry.     Physical Exam   Triage Vital Signs: ED Triage Vitals  Enc Vitals Group     BP 12/28/21 1853 (!) 147/99     Pulse Rate 12/28/21 1853 (!) 101     Resp 12/28/21 1853 20     Temp 12/28/21 1853 97.8 F (36.6 C)     Temp Source 12/28/21 1853 Oral     SpO2 12/28/21 1853 100 %     Weight 12/28/21 1854 240 lb (108.9 kg)     Height 12/28/21 1854 '6\' 1"'$  (1.854 m)     Head Circumference --      Peak Flow --      Pain Score 12/28/21 1859 0     Pain Loc --      Pain Edu? --      Excl. in [place]? --     Most recent vital signs: Vitals:   12/28/21 1853  BP: (!) 147/99  Pulse: (!) 101  Resp: 20  Temp: 97.8 F (36.6 C)  SpO2: 100%     General: Alert and in no acute distress.   Cardiovascular:  Good peripheral perfusion Respiratory: Normal respiratory effort without tachypnea or retractions. Lungs CTAB. Musculoskeletal: Full range of motion to all extremities.  Patient has a healing surgical site to the left foot.  There is a dehiscence of the wound with what appears to be roughly 8 staples having popped free.  Patient has an area of eschar along the superior aspect of the incision.  Pictures are below for  further identification of this area. Neurologic:  No gross focal neurologic deficits are appreciated.  Skin:   No rash noted Other:          ED Results / Procedures / Treatments   Labs (all labs ordered are listed, but only abnormal results are displayed) Labs Reviewed - No data to display   EKG     RADIOLOGY    No results found.  PROCEDURES:  Critical Care performed: No  Procedures   MEDICATIONS ORDERED IN ED: Medications  clindamycin (CLEOCIN) capsule 300 mg (has no administration in time range)     IMPRESSION / MDM / ASSESSMENT AND PLAN / ED COURSE  I reviewed the triage vital signs and the nursing notes.                              Differential diagnosis includes, but is not limited to, wound complication, wound dehiscence, wound infection  Patient's presentation is most consistent with acute presentation with potential threat to life or bodily function.   Patient's diagnosis is consistent with dehiscence of surgical site.  Patient presented to the emergency department after he tripped over one of his dogs at home.  Patient had a transmetatarsal resection performed by podiatry roughly 15 days ago.  Patient tripped over one of his dogs and to his to his surgical wound.  Pictures are attached to the chart.  I reached out to podiatry for recommendations as I did not feel that this could be managed with primary closure in the emergency department.  1 call podiatrist, consulted his podiatrist to perform the last surgery.  The podiatry group, all 3 surgeons have performed procedures on the patient but most recently it was Thresa Ross.  I talked to Dr. Unice Cobble, Dr. Zachery Conch talk to Dr. Caryl Comes about this patient.  At this time they recommend adding antibiotics, patient is receiving Ancef through PICC line but recommended adding additional antibiotics due to the wound dehiscence.  I will cover with clindamycin.  I cleansed and redressed the wound.  They also recommended  that podiatry will see the patient tomorrow.  Patient is instructed to call podiatry first thing in the morning to set up appointment time..  Concerning signs and symptoms and return precautions for tonight are discussed with the patient.  Otherwise follow-up with podiatry in the morning for revision of the patient's surgical site.. Patient is given ED precautions to return to the ED for any worsening or new symptoms.        FINAL CLINICAL IMPRESSION(S) / ED DIAGNOSES   Final diagnoses:  Visit for wound check  Dehiscence of operative wound, initial encounter     Rx / DC Orders   ED Discharge Orders          Ordered    clindamycin (CLEOCIN) 300 MG capsule  4 times daily        12/28/21 2109             Note:  This document was prepared using Dragon voice recognition software and may include unintentional dictation errors.   Brynda Peon 12/28/21 2109    Rada Hay, MD 12/30/21 [phone]

## 2022-01-06 ENCOUNTER — Other Ambulatory Visit
Admission: RE | Admit: 2022-01-06 | Discharge: 2022-01-06 | Disposition: A | Discharge status: Home / Self Care | Payer: Managed Care, Other (non HMO) | Source: Ambulatory Visit | Attending: Infectious Diseases | Admitting: Infectious Diseases

## 2022-01-06 ENCOUNTER — Ambulatory Visit: Payer: Managed Care, Other (non HMO) | Attending: Infectious Diseases | Admitting: Infectious Diseases

## 2022-01-06 ENCOUNTER — Encounter: Payer: Self-pay | Admitting: Infectious Diseases

## 2022-01-06 VITALS — BP 136/83 | HR 80 | Temp 97.2°F | Ht 73.0 in | Wt 245.0 lb

## 2022-01-06 DIAGNOSIS — I10 Essential (primary) hypertension: Secondary | ICD-10-CM | POA: Insufficient documentation

## 2022-01-06 DIAGNOSIS — E039 Hypothyroidism, unspecified: Secondary | ICD-10-CM | POA: Diagnosis not present

## 2022-01-06 DIAGNOSIS — T8744 Infection of amputation stump, left lower extremity: Secondary | ICD-10-CM | POA: Diagnosis not present

## 2022-01-06 DIAGNOSIS — Z9861 Coronary angioplasty status: Secondary | ICD-10-CM | POA: Insufficient documentation

## 2022-01-06 DIAGNOSIS — F32A Depression, unspecified: Secondary | ICD-10-CM | POA: Insufficient documentation

## 2022-01-06 DIAGNOSIS — Z9884 Bariatric surgery status: Secondary | ICD-10-CM | POA: Insufficient documentation

## 2022-01-06 DIAGNOSIS — E1140 Type 2 diabetes mellitus with diabetic neuropathy, unspecified: Secondary | ICD-10-CM | POA: Diagnosis not present

## 2022-01-06 DIAGNOSIS — L089 Local infection of the skin and subcutaneous tissue, unspecified: Secondary | ICD-10-CM | POA: Insufficient documentation

## 2022-01-06 DIAGNOSIS — Z79890 Hormone replacement therapy (postmenopausal): Secondary | ICD-10-CM | POA: Insufficient documentation

## 2022-01-06 DIAGNOSIS — I2510 Atherosclerotic heart disease of native coronary artery without angina pectoris: Secondary | ICD-10-CM | POA: Diagnosis not present
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
## 2022-01-06 NOTE — Patient Instructions (Signed)
You are ehre forf ollow up of left foot infection which has dopened up because of fall- today will do a culture and depending on that will extend IV or change to oral antibiotic- follow up 2 weeks

## 2022-01-08 ENCOUNTER — Telehealth: Payer: Self-pay

## 2022-01-08 NOTE — Telephone Encounter (Signed)
Per Dr. Delaine Lame she would like to extend IV antibiotics until 9/25/3.  I have sent [facility] a community message to notify them of extending the IV antibiotics. I left a voicemail on a secure line with updated treatment plan for the patient.  Charles [name] T Brooks Sailors

## 2022-01-09 ENCOUNTER — Other Ambulatory Visit: Payer: Self-pay | Admitting: Physician Assistant

## 2022-01-09 DIAGNOSIS — F331 Major depressive disorder, recurrent, moderate: Secondary | ICD-10-CM

## 2022-01-11 ENCOUNTER — Encounter: Payer: Self-pay | Admitting: Infectious Diseases

## 2022-01-11 LAB — AEROBIC CULTURE W GRAM STAIN (SUPERFICIAL SPECIMEN): Gram Stain: NONE SEEN

## 2022-01-12 MED ORDER — CIPROFLOXACIN HCL 500 MG PO TABS
500.0000 mg | ORAL_TABLET | Freq: Two times a day (BID) | ORAL | 0 refills | Status: DC
Start: 2022-01-12 — End: 2022-01-22

## 2022-01-12 NOTE — Telephone Encounter (Signed)
Spoke with Charles [name], advised him that Dr. Delaine Lame would like him to stop the IV cefazolin and start taking oral ciprofloxacin.   Advised him she would like him to keep his PICC line patent by flushing twice a day with normal saline flushes until his appointment with her on 10/3.  Community message sent to Carolynn Sayers, RN with updated orders.   Beryle Flock, RN

## 2022-01-13 ENCOUNTER — Other Ambulatory Visit: Payer: Self-pay | Admitting: Physician Assistant

## 2022-01-13 DIAGNOSIS — F5101 Primary insomnia: Secondary | ICD-10-CM

## 2022-01-20 ENCOUNTER — Ambulatory Visit: Payer: Managed Care, Other (non HMO) | Admitting: Infectious Diseases

## 2022-01-20 ENCOUNTER — Telehealth: Payer: Self-pay

## 2022-01-20 ENCOUNTER — Ambulatory Visit: Payer: Managed Care, Other (non HMO) | Attending: Infectious Diseases | Admitting: Infectious Diseases

## 2022-01-20 ENCOUNTER — Encounter: Payer: Self-pay | Admitting: Infectious Diseases

## 2022-01-20 VITALS — BP 129/83 | HR 85 | Temp 97.5°F

## 2022-01-20 DIAGNOSIS — B965 Pseudomonas (aeruginosa) (mallei) (pseudomallei) as the cause of diseases classified elsewhere: Secondary | ICD-10-CM | POA: Diagnosis not present

## 2022-01-20 DIAGNOSIS — Z9861 Coronary angioplasty status: Secondary | ICD-10-CM | POA: Diagnosis not present

## 2022-01-20 DIAGNOSIS — F32A Depression, unspecified: Secondary | ICD-10-CM | POA: Diagnosis not present

## 2022-01-20 DIAGNOSIS — I2510 Atherosclerotic heart disease of native coronary artery without angina pectoris: Secondary | ICD-10-CM | POA: Insufficient documentation

## 2022-01-20 DIAGNOSIS — L089 Local infection of the skin and subcutaneous tissue, unspecified: Secondary | ICD-10-CM

## 2022-01-20 DIAGNOSIS — E039 Hypothyroidism, unspecified: Secondary | ICD-10-CM | POA: Insufficient documentation

## 2022-01-20 DIAGNOSIS — E119 Type 2 diabetes mellitus without complications: Secondary | ICD-10-CM | POA: Diagnosis not present

## 2022-01-20 DIAGNOSIS — Z792 Long term (current) use of antibiotics: Secondary | ICD-10-CM | POA: Diagnosis not present

## 2022-01-20 DIAGNOSIS — Z5982 Transportation insecurity: Secondary | ICD-10-CM | POA: Diagnosis not present

## 2022-01-20 DIAGNOSIS — I10 Essential (primary) hypertension: Secondary | ICD-10-CM | POA: Insufficient documentation

## 2022-01-20 DIAGNOSIS — Z9884 Bariatric surgery status: Secondary | ICD-10-CM | POA: Diagnosis not present

## 2022-01-20 DIAGNOSIS — Z5941 Food insecurity: Secondary | ICD-10-CM | POA: Diagnosis not present

## 2022-01-20 DIAGNOSIS — B9561 Methicillin susceptible Staphylococcus aureus infection as the cause of diseases classified elsewhere: Secondary | ICD-10-CM | POA: Insufficient documentation

## 2022-01-20 DIAGNOSIS — Z79890 Hormone replacement therapy (postmenopausal): Secondary | ICD-10-CM | POA: Diagnosis not present

## 2022-01-20 DIAGNOSIS — Z89422 Acquired absence of other left toe(s): Secondary | ICD-10-CM | POA: Insufficient documentation

## 2022-01-20 DIAGNOSIS — M868X7 Other osteomyelitis, ankle and foot: Secondary | ICD-10-CM | POA: Insufficient documentation

## 2022-01-20 NOTE — Telephone Encounter (Signed)
Patient seen in office today. Dr. Delaine Lame removed picc line today at patient's visit with her. Kim with [facility] has been informed that patient's picc line has been removed.  [place] Winnie Umali, CMA

## 2022-01-21 ENCOUNTER — Encounter
Admission: EM | Disposition: A | Discharge status: Home / Self Care | Payer: Self-pay | Source: Home / Self Care | Attending: Internal Medicine

## 2022-01-21 ENCOUNTER — Inpatient Hospital Stay
Admission: EM | Admit: 2022-01-21 | Discharge: 2022-01-22 | DRG: 475 | Disposition: A | Discharge status: Home / Self Care | Payer: Managed Care, Other (non HMO) | Attending: Internal Medicine | Admitting: Internal Medicine

## 2022-01-21 ENCOUNTER — Inpatient Hospital Stay: Payer: Managed Care, Other (non HMO)

## 2022-01-21 ENCOUNTER — Inpatient Hospital Stay: Payer: Managed Care, Other (non HMO) | Admitting: Anesthesiology

## 2022-01-21 ENCOUNTER — Other Ambulatory Visit: Payer: Self-pay

## 2022-01-21 ENCOUNTER — Emergency Department: Payer: Managed Care, Other (non HMO)

## 2022-01-21 ENCOUNTER — Encounter: Payer: Self-pay | Admitting: Intensive Care

## 2022-01-21 DIAGNOSIS — E785 Hyperlipidemia, unspecified: Secondary | ICD-10-CM | POA: Diagnosis present

## 2022-01-21 DIAGNOSIS — Z8711 Personal history of peptic ulcer disease: Secondary | ICD-10-CM

## 2022-01-21 DIAGNOSIS — K219 Gastro-esophageal reflux disease without esophagitis: Secondary | ICD-10-CM | POA: Diagnosis present

## 2022-01-21 DIAGNOSIS — Z888 Allergy status to other drugs, medicaments and biological substances status: Secondary | ICD-10-CM

## 2022-01-21 DIAGNOSIS — I2510 Atherosclerotic heart disease of native coronary artery without angina pectoris: Secondary | ICD-10-CM | POA: Diagnosis present

## 2022-01-21 DIAGNOSIS — L089 Local infection of the skin and subcutaneous tissue, unspecified: Principal | ICD-10-CM

## 2022-01-21 DIAGNOSIS — F909 Attention-deficit hyperactivity disorder, unspecified type: Secondary | ICD-10-CM | POA: Diagnosis present

## 2022-01-21 DIAGNOSIS — Z79899 Other long term (current) drug therapy: Secondary | ICD-10-CM | POA: Diagnosis not present

## 2022-01-21 DIAGNOSIS — F419 Anxiety disorder, unspecified: Secondary | ICD-10-CM | POA: Diagnosis present

## 2022-01-21 DIAGNOSIS — T8781 Dehiscence of amputation stump: Principal | ICD-10-CM | POA: Diagnosis present

## 2022-01-21 DIAGNOSIS — I10 Essential (primary) hypertension: Secondary | ICD-10-CM | POA: Diagnosis present

## 2022-01-21 DIAGNOSIS — Z7982 Long term (current) use of aspirin: Secondary | ICD-10-CM

## 2022-01-21 DIAGNOSIS — Z8616 Personal history of COVID-19: Secondary | ICD-10-CM

## 2022-01-21 DIAGNOSIS — Z955 Presence of coronary angioplasty implant and graft: Secondary | ICD-10-CM

## 2022-01-21 DIAGNOSIS — Z9682 Presence of neurostimulator: Secondary | ICD-10-CM

## 2022-01-21 DIAGNOSIS — M86672 Other chronic osteomyelitis, left ankle and foot: Secondary | ICD-10-CM | POA: Diagnosis present

## 2022-01-21 DIAGNOSIS — Z9884 Bariatric surgery status: Secondary | ICD-10-CM

## 2022-01-21 DIAGNOSIS — B9561 Methicillin susceptible Staphylococcus aureus infection as the cause of diseases classified elsewhere: Secondary | ICD-10-CM | POA: Diagnosis present

## 2022-01-21 DIAGNOSIS — G8929 Other chronic pain: Secondary | ICD-10-CM | POA: Diagnosis present

## 2022-01-21 DIAGNOSIS — G4733 Obstructive sleep apnea (adult) (pediatric): Secondary | ICD-10-CM | POA: Diagnosis present

## 2022-01-21 DIAGNOSIS — E669 Obesity, unspecified: Secondary | ICD-10-CM | POA: Diagnosis present

## 2022-01-21 DIAGNOSIS — M1990 Unspecified osteoarthritis, unspecified site: Secondary | ICD-10-CM | POA: Diagnosis present

## 2022-01-21 DIAGNOSIS — E039 Hypothyroidism, unspecified: Secondary | ICD-10-CM | POA: Diagnosis present

## 2022-01-21 DIAGNOSIS — Z9861 Coronary angioplasty status: Secondary | ICD-10-CM | POA: Diagnosis not present

## 2022-01-21 DIAGNOSIS — G894 Chronic pain syndrome: Secondary | ICD-10-CM | POA: Diagnosis present

## 2022-01-21 DIAGNOSIS — F418 Other specified anxiety disorders: Secondary | ICD-10-CM | POA: Diagnosis present

## 2022-01-21 DIAGNOSIS — I252 Old myocardial infarction: Secondary | ICD-10-CM | POA: Diagnosis not present

## 2022-01-21 DIAGNOSIS — E1169 Type 2 diabetes mellitus with other specified complication: Secondary | ICD-10-CM | POA: Diagnosis present

## 2022-01-21 DIAGNOSIS — Z89421 Acquired absence of other right toe(s): Secondary | ICD-10-CM

## 2022-01-21 DIAGNOSIS — Z79890 Hormone replacement therapy (postmenopausal): Secondary | ICD-10-CM

## 2022-01-21 DIAGNOSIS — T148XXA Other injury of unspecified body region, initial encounter: Secondary | ICD-10-CM | POA: Diagnosis present

## 2022-01-21 DIAGNOSIS — E1140 Type 2 diabetes mellitus with diabetic neuropathy, unspecified: Secondary | ICD-10-CM | POA: Diagnosis present

## 2022-01-21 DIAGNOSIS — Z833 Family history of diabetes mellitus: Secondary | ICD-10-CM | POA: Diagnosis not present

## 2022-01-21 HISTORY — PX: TRANSMETATARSAL AMPUTATION: SHX6197

## 2022-01-21 LAB — CBC WITH DIFFERENTIAL/PLATELET
Abs Immature Granulocytes: 0.01 10*3/uL (ref 0.00–0.07)
Basophils Absolute: 0.1 10*3/uL (ref 0.0–0.1)
Basophils Relative: 1 %
Eosinophils Absolute: 0.2 10*3/uL (ref 0.0–0.5)
Eosinophils Relative: 4 %
HCT: 35.8 % — ABNORMAL LOW (ref 39.0–52.0)
Hemoglobin: 11.2 g/dL — ABNORMAL LOW (ref 13.0–17.0)
Immature Granulocytes: 0 %
Lymphocytes Relative: 43 %
Lymphs Abs: 2.6 10*3/uL (ref 0.7–4.0)
MCH: 28.9 pg (ref 26.0–34.0)
MCHC: 31.3 g/dL (ref 30.0–36.0)
MCV: 92.3 fL (ref 80.0–100.0)
Monocytes Absolute: 0.6 10*3/uL (ref 0.1–1.0)
Monocytes Relative: 9 %
Neutro Abs: 2.6 10*3/uL (ref 1.7–7.7)
Neutrophils Relative %: 43 %
Platelets: 361 10*3/uL (ref 150–400)
RBC: 3.88 MIL/uL — ABNORMAL LOW (ref 4.22–5.81)
RDW: 15.8 % — ABNORMAL HIGH (ref 11.5–15.5)
WBC: 6 10*3/uL (ref 4.0–10.5)
nRBC: 0 % (ref 0.0–0.2)

## 2022-01-21 LAB — COMPREHENSIVE METABOLIC PANEL
ALT: 13 U/L (ref 0–44)
AST: 32 U/L (ref 15–41)
Albumin: 3.4 g/dL — ABNORMAL LOW (ref 3.5–5.0)
Alkaline Phosphatase: 119 U/L (ref 38–126)
Anion gap: 4 — ABNORMAL LOW (ref 5–15)
BUN: 27 mg/dL — ABNORMAL HIGH (ref 8–23)
CO2: 24 mmol/L (ref 22–32)
Calcium: 8.8 mg/dL — ABNORMAL LOW (ref 8.9–10.3)
Chloride: 109 mmol/L (ref 98–111)
Creatinine, Ser: 1.13 mg/dL (ref 0.61–1.24)
GFR, Estimated: >60 mL/min (ref >60)
Glucose, Bld: 105 mg/dL — ABNORMAL HIGH (ref 70–99)
Potassium: 4.6 mmol/L (ref 3.5–5.1)
Sodium: 137 mmol/L (ref 135–145)
Total Bilirubin: 0.5 mg/dL (ref 0.3–1.2)
Total Protein: 7.6 g/dL (ref 6.5–8.1)

## 2022-01-21 LAB — PROTIME-INR
INR: 1.1 (ref 0.8–1.2)
Prothrombin Time: 13.7 seconds (ref 11.4–15.2)

## 2022-01-21 LAB — SEDIMENTATION RATE: Sed Rate: 44 mm/hr — ABNORMAL HIGH (ref 0–20)

## 2022-01-21 LAB — C-REACTIVE PROTEIN: CRP: 0.7 mg/dL (ref <1.0)

## 2022-01-21 LAB — SARS CORONAVIRUS 2 BY RT PCR: SARS Coronavirus 2 by RT PCR: NEGATIVE

## 2022-01-21 LAB — APTT: aPTT: 30 seconds (ref 24–36)

## 2022-01-21 SURGERY — AMPUTATION, FOOT, TRANSMETATARSAL
Anesthesia: General | Site: Toe | Laterality: Left

## 2022-01-21 MED ORDER — ALBUTEROL SULFATE (2.5 MG/3ML) 0.083% IN NEBU: 3.0000 mL | INHALATION_SOLUTION | RESPIRATORY_TRACT | Status: DC | PRN

## 2022-01-21 MED ORDER — VITAMIN D 25 MCG (1000 UNIT) PO TABS
2000.0000 [IU] | ORAL_TABLET | Freq: Every day | ORAL | Status: DC
  Administered 2022-01-21 – 2022-01-22 (×2): 2000 [IU] via ORAL
  Filled 2022-01-21 (×2): qty 2

## 2022-01-21 MED ORDER — LACTATED RINGERS IV SOLN: INTRAVENOUS | Status: DC | PRN

## 2022-01-21 MED ORDER — HYDROMORPHONE HCL 1 MG/ML IJ SOLN
INTRAMUSCULAR | Status: AC
  Filled 2022-01-21: qty 1

## 2022-01-21 MED ORDER — ZINC SULFATE 220 (50 ZN) MG PO CAPS
220.0000 mg | ORAL_CAPSULE | Freq: Every day | ORAL | Status: DC
  Administered 2022-01-21 – 2022-01-22 (×2): 220 mg via ORAL
  Filled 2022-01-21 (×2): qty 1

## 2022-01-21 MED ORDER — MORPHINE SULFATE (PF) 4 MG/ML IV SOLN: 4.0000 mg | INTRAVENOUS | Status: DC | PRN

## 2022-01-21 MED ORDER — DEXMEDETOMIDINE HCL IN NACL 200 MCG/50ML IV SOLN
INTRAVENOUS | Status: DC | PRN
  Administered 2022-01-21: 8 ug via INTRAVENOUS

## 2022-01-21 MED ORDER — ORAL CARE MOUTH RINSE: 15.0000 mL | Freq: Once | OROMUCOSAL | Status: AC

## 2022-01-21 MED ORDER — MIDAZOLAM HCL 2 MG/2ML IJ SOLN
INTRAMUSCULAR | Status: DC | PRN
  Administered 2022-01-21: 2 mg via INTRAVENOUS

## 2022-01-21 MED ORDER — 0.9 % SODIUM CHLORIDE (POUR BTL) OPTIME
TOPICAL | Status: DC | PRN
  Administered 2022-01-21: 500 mL

## 2022-01-21 MED ORDER — PROPOFOL 10 MG/ML IV BOLUS
INTRAVENOUS | Status: AC
  Filled 2022-01-21: qty 20

## 2022-01-21 MED ORDER — ZOLPIDEM TARTRATE 5 MG PO TABS: 10.0000 mg | ORAL_TABLET | Freq: Every evening | ORAL | Status: DC | PRN

## 2022-01-21 MED ORDER — SUCCINYLCHOLINE CHLORIDE 200 MG/10ML IV SOSY: PREFILLED_SYRINGE | INTRAVENOUS | Status: DC | PRN

## 2022-01-21 MED ORDER — ACETAMINOPHEN 10 MG/ML IV SOLN
INTRAVENOUS | Status: AC
  Filled 2022-01-21: qty 100

## 2022-01-21 MED ORDER — LISDEXAMFETAMINE DIMESYLATE 20 MG PO CAPS
40.0000 mg | ORAL_CAPSULE | ORAL | Status: DC
  Administered 2022-01-21: 40 mg via ORAL
  Filled 2022-01-21: qty 2

## 2022-01-21 MED ORDER — CHLORHEXIDINE GLUCONATE 0.12 % MT SOLN
OROMUCOSAL | Status: AC
  Filled 2022-01-21: qty 15

## 2022-01-21 MED ORDER — AMLODIPINE BESYLATE 5 MG PO TABS
5.0000 mg | ORAL_TABLET | Freq: Every day | ORAL | Status: DC
  Administered 2022-01-21 – 2022-01-22 (×2): 5 mg via ORAL
  Filled 2022-01-21 (×2): qty 1

## 2022-01-21 MED ORDER — MIDAZOLAM HCL 2 MG/2ML IJ SOLN
INTRAMUSCULAR | Status: AC
  Filled 2022-01-21: qty 2

## 2022-01-21 MED ORDER — CARVEDILOL 3.125 MG PO TABS
3.1250 mg | ORAL_TABLET | Freq: Two times a day (BID) | ORAL | Status: DC
  Administered 2022-01-22: 3.125 mg via ORAL
  Filled 2022-01-21: qty 1

## 2022-01-21 MED ORDER — FENTANYL CITRATE (PF) 100 MCG/2ML IJ SOLN
INTRAMUSCULAR | Status: AC
  Filled 2022-01-21: qty 2

## 2022-01-21 MED ORDER — ACETAMINOPHEN 325 MG PO TABS: 650.0000 mg | ORAL_TABLET | Freq: Four times a day (QID) | ORAL | Status: DC | PRN

## 2022-01-21 MED ORDER — ONDANSETRON HCL 4 MG/2ML IJ SOLN: 4.0000 mg | Freq: Three times a day (TID) | INTRAMUSCULAR | Status: DC | PRN

## 2022-01-21 MED ORDER — HYDROMORPHONE HCL 1 MG/ML IJ SOLN
0.2500 mg | INTRAMUSCULAR | Status: DC | PRN
  Administered 2022-01-21 (×2): 0.5 mg via INTRAVENOUS

## 2022-01-21 MED ORDER — ACETAMINOPHEN 10 MG/ML IV SOLN: 1000.0000 mg | Freq: Once | INTRAVENOUS | Status: DC | PRN

## 2022-01-21 MED ORDER — GLYCOPYRROLATE 0.2 MG/ML IJ SOLN
INTRAMUSCULAR | Status: DC | PRN
  Administered 2022-01-21: .2 mg via INTRAVENOUS

## 2022-01-21 MED ORDER — LACTATED RINGERS IV SOLN: INTRAVENOUS | Status: DC

## 2022-01-21 MED ORDER — DEXAMETHASONE SODIUM PHOSPHATE 10 MG/ML IJ SOLN
INTRAMUSCULAR | Status: DC | PRN
  Administered 2022-01-21: 10 mg via INTRAVENOUS

## 2022-01-21 MED ORDER — HYDRALAZINE HCL 20 MG/ML IJ SOLN: 5.0000 mg | INTRAMUSCULAR | Status: DC | PRN

## 2022-01-21 MED ORDER — ACETAMINOPHEN 10 MG/ML IV SOLN
INTRAVENOUS | Status: DC | PRN
  Administered 2022-01-21: 1000 mg via INTRAVENOUS

## 2022-01-21 MED ORDER — DM-GUAIFENESIN ER 30-600 MG PO TB12: 1.0000 | ORAL_TABLET | Freq: Two times a day (BID) | ORAL | Status: DC | PRN

## 2022-01-21 MED ORDER — VANCOMYCIN HCL IN DEXTROSE 1-5 GM/200ML-% IV SOLN
1000.0000 mg | Freq: Once | INTRAVENOUS | Status: AC
  Administered 2022-01-21: 1000 mg via INTRAVENOUS
  Filled 2022-01-21: qty 200

## 2022-01-21 MED ORDER — METAXALONE 800 MG PO TABS
1600.0000 mg | ORAL_TABLET | Freq: Three times a day (TID) | ORAL | Status: DC
  Administered 2022-01-21 – 2022-01-22 (×3): 1600 mg via ORAL
  Filled 2022-01-21 (×4): qty 2

## 2022-01-21 MED ORDER — CHLORHEXIDINE GLUCONATE 0.12 % MT SOLN
15.0000 mL | Freq: Once | OROMUCOSAL | Status: AC
  Administered 2022-01-21: 15 mL via OROMUCOSAL

## 2022-01-21 MED ORDER — HYDROMORPHONE HCL 1 MG/ML IJ SOLN
INTRAMUSCULAR | Status: DC | PRN
  Administered 2022-01-21: 1 mg via INTRAVENOUS

## 2022-01-21 MED ORDER — BUPROPION HCL ER (XL) 150 MG PO TB24
150.0000 mg | ORAL_TABLET | Freq: Every day | ORAL | Status: DC
  Administered 2022-01-21 – 2022-01-22 (×2): 150 mg via ORAL
  Filled 2022-01-21 (×2): qty 1

## 2022-01-21 MED ORDER — PIPERACILLIN-TAZOBACTAM 3.375 G IVPB
3.3750 g | Freq: Three times a day (TID) | INTRAVENOUS | Status: DC
  Administered 2022-01-21 – 2022-01-22 (×3): 3.375 g via INTRAVENOUS
  Filled 2022-01-21 (×3): qty 50

## 2022-01-21 MED ORDER — ASPIRIN 81 MG PO TBEC
81.0000 mg | DELAYED_RELEASE_TABLET | Freq: Every day | ORAL | Status: DC
  Administered 2022-01-21 – 2022-01-22 (×2): 81 mg via ORAL
  Filled 2022-01-21 (×2): qty 1

## 2022-01-21 MED ORDER — LIDOCAINE HCL (CARDIAC) PF 100 MG/5ML IV SOSY
PREFILLED_SYRINGE | INTRAVENOUS | Status: DC | PRN
  Administered 2022-01-21: 100 mg via INTRAVENOUS

## 2022-01-21 MED ORDER — DULOXETINE HCL 30 MG PO CPEP
60.0000 mg | ORAL_CAPSULE | Freq: Every day | ORAL | Status: DC
  Administered 2022-01-21 – 2022-01-22 (×2): 60 mg via ORAL
  Filled 2022-01-21: qty 1
  Filled 2022-01-21: qty 2

## 2022-01-21 MED ORDER — LEVOTHYROXINE SODIUM 50 MCG PO TABS
75.0000 ug | ORAL_TABLET | Freq: Every day | ORAL | Status: DC
  Administered 2022-01-22: 75 ug via ORAL
  Filled 2022-01-21: qty 1

## 2022-01-21 MED ORDER — BUPIVACAINE HCL (PF) 0.5 % IJ SOLN
INTRAMUSCULAR | Status: DC | PRN
  Administered 2022-01-21: 20 mL

## 2022-01-21 MED ORDER — HEPARIN SODIUM (PORCINE) 5000 UNIT/ML IJ SOLN
5000.0000 [IU] | Freq: Three times a day (TID) | INTRAMUSCULAR | Status: DC
  Administered 2022-01-21 – 2022-01-22 (×4): 5000 [IU] via SUBCUTANEOUS
  Filled 2022-01-21 (×4): qty 1

## 2022-01-21 MED ORDER — ATORVASTATIN CALCIUM 20 MG PO TABS
40.0000 mg | ORAL_TABLET | Freq: Every day | ORAL | Status: DC
  Administered 2022-01-21: 40 mg via ORAL
  Filled 2022-01-21: qty 2

## 2022-01-21 MED ORDER — SEVOFLURANE IN SOLN
RESPIRATORY_TRACT | Status: AC
  Filled 2022-01-21: qty 250

## 2022-01-21 MED ORDER — FENTANYL CITRATE (PF) 100 MCG/2ML IJ SOLN
INTRAMUSCULAR | Status: DC | PRN
  Administered 2022-01-21: 100 ug via INTRAVENOUS

## 2022-01-21 MED ORDER — PIPERACILLIN-TAZOBACTAM 3.375 G IVPB
INTRAVENOUS | Status: AC
  Administered 2022-01-21: 3.375 g via INTRAVENOUS
  Filled 2022-01-21: qty 50

## 2022-01-21 MED ORDER — PROPOFOL 10 MG/ML IV BOLUS
INTRAVENOUS | Status: DC | PRN
  Administered 2022-01-21: 200 mg via INTRAVENOUS

## 2022-01-21 MED ORDER — OXYCODONE HCL 5 MG PO TABS
10.0000 mg | ORAL_TABLET | ORAL | Status: DC | PRN
  Administered 2022-01-21 – 2022-01-22 (×5): 10 mg via ORAL
  Filled 2022-01-21 (×5): qty 2

## 2022-01-21 MED ORDER — KETOROLAC TROMETHAMINE 30 MG/ML IJ SOLN
INTRAMUSCULAR | Status: AC
  Filled 2022-01-21: qty 1

## 2022-01-21 MED ORDER — KETOROLAC TROMETHAMINE 30 MG/ML IJ SOLN
30.0000 mg | Freq: Once | INTRAMUSCULAR | Status: AC | PRN
  Administered 2022-01-21: 30 mg via INTRAVENOUS

## 2022-01-21 MED ORDER — HYDROMORPHONE HCL 1 MG/ML IJ SOLN
INTRAMUSCULAR | Status: AC
  Administered 2022-01-21: 0.5 mg via INTRAVENOUS
  Filled 2022-01-21: qty 1

## 2022-01-21 MED ORDER — MORPHINE SULFATE (PF) 2 MG/ML IV SOLN
2.0000 mg | INTRAVENOUS | Status: DC | PRN
  Administered 2022-01-22 (×2): 2 mg via INTRAVENOUS
  Filled 2022-01-21 (×2): qty 1

## 2022-01-21 MED ORDER — VANCOMYCIN HCL 1000 MG IV SOLR
INTRAVENOUS | Status: AC
  Filled 2022-01-21: qty 20

## 2022-01-21 MED ORDER — ONDANSETRON HCL 4 MG/2ML IJ SOLN
INTRAMUSCULAR | Status: DC | PRN
  Administered 2022-01-21: 4 mg via INTRAVENOUS

## 2022-01-21 MED ORDER — VITAMIN C 500 MG PO TABS
500.0000 mg | ORAL_TABLET | Freq: Every day | ORAL | Status: DC
  Administered 2022-01-21 – 2022-01-22 (×2): 500 mg via ORAL
  Filled 2022-01-21 (×2): qty 1

## 2022-01-21 MED ORDER — ROCURONIUM BROMIDE 100 MG/10ML IV SOLN
INTRAVENOUS | Status: DC | PRN
  Administered 2022-01-21: 10 mg via INTRAVENOUS
  Administered 2022-01-21: 20 mg via INTRAVENOUS
  Administered 2022-01-21: 50 mg via INTRAVENOUS

## 2022-01-21 MED ORDER — BUPIVACAINE HCL (PF) 0.5 % IJ SOLN
INTRAMUSCULAR | Status: AC
  Filled 2022-01-21: qty 30

## 2022-01-21 SURGICAL SUPPLY — 51 items
BLADE MED AGGRESSIVE (BLADE) ×1 IMPLANT
BNDG COHESIVE 4X5 TAN STRL LF (GAUZE/BANDAGES/DRESSINGS) ×1 IMPLANT
BNDG ELASTIC 4X5.8 VLCR NS LF (GAUZE/BANDAGES/DRESSINGS) ×1 IMPLANT
BNDG ESMARK 4X12 TAN STRL LF (GAUZE/BANDAGES/DRESSINGS) ×1 IMPLANT
BNDG GAUZE DERMACEA FLUFF 4 (GAUZE/BANDAGES/DRESSINGS) ×1 IMPLANT
CNTNR SPEC 2.5X3XGRAD LEK (MISCELLANEOUS) ×6
CONT SPEC 4OZ STER OR WHT (MISCELLANEOUS) ×6
CONT SPEC 4OZ STRL OR WHT (MISCELLANEOUS) ×6
CONTAINER SPEC 2.5X3XGRAD LEK (MISCELLANEOUS) IMPLANT
CUFF TOURN SGL QUICK 12 (TOURNIQUET CUFF) IMPLANT
CUFF TOURN SGL QUICK 18X4 (TOURNIQUET CUFF) IMPLANT
DRAIN PENROSE 12X.25 LTX STRL (MISCELLANEOUS) ×1 IMPLANT
DRAPE FLUOR MINI C-ARM 54X84 (DRAPES) ×1 IMPLANT
DRSG GAUZE FLUFF 36X18 (GAUZE/BANDAGES/DRESSINGS) ×1 IMPLANT
DURAPREP 26ML APPLICATOR (WOUND CARE) ×1 IMPLANT
ELECT REM PT RETURN 9FT ADLT (ELECTROSURGICAL) ×1
ELECTRODE REM PT RTRN 9FT ADLT (ELECTROSURGICAL) ×1 IMPLANT
GAUZE SPONGE 4X4 12PLY STRL (GAUZE/BANDAGES/DRESSINGS) ×1 IMPLANT
GAUZE XEROFORM 1X8 LF (GAUZE/BANDAGES/DRESSINGS) ×1 IMPLANT
GLOVE BIO SURGEON STRL SZ7.5 (GLOVE) ×1 IMPLANT
GLOVE SURG UNDER LTX SZ8 (GLOVE) ×1 IMPLANT
GOWN STRL REUS W/ TWL LRG LVL3 (GOWN DISPOSABLE) ×2 IMPLANT
GOWN STRL REUS W/TWL LRG LVL3 (GOWN DISPOSABLE) ×2
HANDLE YANKAUER SUCT BULB TIP (MISCELLANEOUS) ×1 IMPLANT
HANDPIECE VERSAJET 45 DEG 14 (MISCELLANEOUS) IMPLANT
HANDPIECE VERSAJET DEBRIDEMENT (MISCELLANEOUS) ×1 IMPLANT
IV NS IRRIG 3000ML ARTHROMATIC (IV SOLUTION) ×1 IMPLANT
KIT TURNOVER KIT A (KITS) ×1 IMPLANT
LABEL OR SOLS (LABEL) ×1 IMPLANT
MANIFOLD NEPTUNE II (INSTRUMENTS) ×1 IMPLANT
NDL SAFETY ECLIP 18X1.5 (MISCELLANEOUS) ×1 IMPLANT
NS IRRIG 500ML POUR BTL (IV SOLUTION) ×1 IMPLANT
PACK EXTREMITY ARMC (MISCELLANEOUS) ×1 IMPLANT
PAD ABD DERMACEA PRESS 5X9 (GAUZE/BANDAGES/DRESSINGS) ×1 IMPLANT
PULSAVAC PLUS IRRIG FAN TIP (DISPOSABLE) ×1
SOL PREP PVP 2OZ (MISCELLANEOUS) ×1
SOLUTION PREP PVP 2OZ (MISCELLANEOUS) ×1 IMPLANT
SPONGE T-LAP 18X18 ~~LOC~~+RFID (SPONGE) ×1 IMPLANT
STOCKINETTE STRL 6IN 960660 (GAUZE/BANDAGES/DRESSINGS) ×1 IMPLANT
STRAP SAFETY 5IN WIDE (MISCELLANEOUS) ×1 IMPLANT
SUT ETHILON 4-0 (SUTURE) ×2
SUT ETHILON 4-0 FS2 18XMFL BLK (SUTURE) ×2
SUT VIC AB 2-0 CT1 27 (SUTURE) ×1
SUT VIC AB 2-0 CT1 TAPERPNT 27 (SUTURE) IMPLANT
SUT VIC AB 4-0 FS2 27 (SUTURE) ×2 IMPLANT
SUT VICRYL AB 3-0 FS1 BRD 27IN (SUTURE) IMPLANT
SUTURE ETHLN 4-0 FS2 18XMF BLK (SUTURE) ×2 IMPLANT
SYR 10ML LL (SYRINGE) ×2 IMPLANT
TIP FAN IRRIG PULSAVAC PLUS (DISPOSABLE) IMPLANT
TRAP FLUID SMOKE EVACUATOR (MISCELLANEOUS) ×1 IMPLANT
WATER STERILE IRR 500ML POUR (IV SOLUTION) ×1 IMPLANT

## 2022-01-21 NOTE — Assessment & Plan Note (Signed)
BMI= 31.66   and BW= 108.9Kg. s/p of gastric bypass -Diet and exercise.   -Encourage to lose weight.

## 2022-01-21 NOTE — Assessment & Plan Note (Addendum)
-   Amlodipine, Coreg, -IV hydralazine prn

## 2022-01-21 NOTE — Op Note (Signed)
Date of operation: 01/21/2022.  Surgeon: Durward Fortes D.P.M.  Preoperative diagnosis: Dehisced wound status post transmetatarsal amputation with recurrent osteomyelitis left foot.  Postoperative diagnosis: Same.  Procedure: Revision transmetatarsal amputation left foot.  Anesthesia: General endotracheal.  Hemostasis: Pneumatic tourniquet left ankle 250 mmHg.  Estimated blood loss: 15 cc.  Pathology: Metatarsals 1 through 5 left foot.  Cultures: Bone culture left first and second metatarsal.  Implants: None.  Injectables: 20 cc 0.5% Marcaine plain.  Complications: None apparent.  Operative indications: This is a 61 year old male with recent transmetatarsal amputation on the left foot last month.  Patient sustained a fall and dehisced the wound on his left foot.  Wound continued to remain gapped open with recent probing to bone and decision was made for hospitalization for revision of his transmetatarsal amputation.  Operative procedure: Patient was taken the operating room and placed on the table in the supine position.  Following satisfactory general endotracheal anesthesia a pneumatic tourniquet was applied at the level of the left ankle and the foot was prepped and draped in usual sterile fashion.  The foot was exsanguinated and the tourniquet inflated to 250 mmHg.  Attention was directed to the distal left forefoot where an incision was made from medial to lateral across the forefoot excising the open wound centrally.  Incision was carried sharply down to the level of the bone where soft tissues were freed from the bone using a key elevator with resection of devitalized tissue in the central aspect.  Using a sagittal saw metatarsals 1 through 5 were excised at the bases and removed.  Bone from the first and second metatarsals was taken using a rongeur for culture.  Each metatarsal was then sent separately for pathology.  Devitalized tissue from the wound was then debrided using a  Versajet debrider on a setting of 4.  The wound was then thoroughly irrigated with 3 L of sterile saline under pulsed irrigation.  Deep closure performed using 2-0 Vicryl simple interrupted sutures along with more superficial using 3-0 Vicryl simple interrupted sutures followed by superficial closure using 3-0 Vicryl simple interrupted sutures.  The skin was then reapproximated using staples.  20 cc of 0.5% Marcaine plain then injected around the forefoot for postoperative analgesia.  Xeroform applied to the incision followed by 4 x 4's ABDs and Kerlix.  Tourniquet was released and second Kerlix and Ace wrap then applied for compression.  The patient was awakened and extubated and transported to the PACU having tolerated the anesthesia and procedure well.

## 2022-01-21 NOTE — ED Provider Notes (Signed)
[facility] Provider Note    Event Date/Time   First MD Initiated Contact with Patient 01/21/22 [phone]     (approximate)  History   Chief Complaint: Wound Infection  HPI  Charles [name] is a 61 y.o. male with a past medical history of anxiety, arthritis, gastric reflux, hypertension, hyperlipidemia presents to the emergency department for left foot infection.  According to the patient he has had amputations of his left foot as well as his right foot.  Most recently had amputation approximately 1 month ago to left foot.  He has been following up with Dr. Cleda Mccreedy of podiatry who has been watching the wound and packing the wound.  States they are concerned for wound infection and nonhealing ulceration was told to come back to the emergency department today for admission to the hospital for further amputation and concern for possible wound infection.  Patient denies any fever.  Denies any pain.  Physical Exam   Triage Vital Signs: ED Triage Vitals  Enc Vitals Group     BP 01/21/22 0711 (!) 149/110     Pulse Rate 01/21/22 0711 84     Resp 01/21/22 0711 18     Temp 01/21/22 0711 98.1 F (36.7 C)     Temp Source 01/21/22 0711 Oral     SpO2 01/21/22 0711 98 %     Weight 01/21/22 0712 240 lb (108.9 kg)     Height 01/21/22 0712 '6\' 1"'$  (1.854 m)     Head Circumference --      Peak Flow --      Pain Score 01/21/22 0711 6     Pain Loc --      Pain Edu? --      Excl. in [place]? --     Most recent vital signs: Vitals:   01/21/22 0711  BP: (!) 149/110  Pulse: 84  Resp: 18  Temp: 98.1 F (36.7 C)  SpO2: 98%    General: Awake, no distress.  CV:  Good peripheral perfusion.  Regular rate and rhythm  Resp:  Normal effort.  Equal breath sounds bilaterally.  Abd:  No distention.  Soft, nontender.  No rebound or guarding. Other:  Open wound to the mid left foot amputation site.  Dry packing in place.  No obvious signs of surrounding cellulitis.   ED Results /  Procedures / Treatments   RADIOLOGY  I have reviewed and interpreted the x-ray images.  Patient has a midfoot amputation I do not see any obvious gas within the foot. Radiology has read the x-ray is osseous destruction of the distal right first metatarsal compatible with osteomyelitis.   MEDICATIONS ORDERED IN ED: Medications - No data to display   IMPRESSION / MDM / [place] / ED COURSE  I reviewed the triage vital signs and the nursing notes.  Patient's presentation is most consistent with acute presentation with potential threat to life or bodily function.  Patient presents emergency department for left foot infection.  Patient had a mid left foot amputation approximately 1 month ago now has a nonhealing wound with concerns for infection sent by podiatry for admission and further amputation.  Overall the patient appears well, no distress, reassuring vital signs besides mild hypertension.  Does have an open wound with dry packing in place.  No reported fever.  Lab work today shows a normal chemistry, normal CBC with normal white blood cell count.  We will obtain an x-ray to rule out gas-forming organisms.  We will discuss with podiatry for plan.  I spoke with Dr. Cleda Mccreedy recommends starting patient on antibiotics.  I have ordered IV vancomycin we will send blood cultures.  We will admit to the hospitalist service they plan to operate either after 5 PM today or tomorrow morning.  We will discuss with the hospitalist for admission.  Hospitalist will be admitting to their service.  I have made the patient n.p.o. after 10 AM.  FINAL CLINICAL IMPRESSION(S) / ED DIAGNOSES   Open wound left foot   Note:  This document was prepared using Dragon voice recognition software and may include unintentional dictation errors.   Harvest Dark, MD 01/21/22 [phone]

## 2022-01-21 NOTE — Plan of Care (Signed)

## 2022-01-21 NOTE — Anesthesia Postprocedure Evaluation (Signed)
Anesthesia Post Note  Patient: Charles [name]  Procedure(s) Performed: REVISION TRANSMETATARSAL AMPUTATION (Left: Toe)  Patient location during evaluation: PACU Anesthesia Type: General Level of consciousness: awake and alert Pain management: pain level controlled Vital Signs Assessment: post-procedure vital signs reviewed and stable Respiratory status: spontaneous breathing, nonlabored ventilation and respiratory function stable Cardiovascular status: blood pressure returned to baseline and stable Postop Assessment: no apparent nausea or vomiting Anesthetic complications: no   No notable events documented.   Last Vitals:  Vitals:   01/21/22 1932 01/21/22 1950  BP: (!) 150/86 (!) 164/99  Pulse: 73 73  Resp: 14 20  Temp: (!) 36.2 C 36.6 C  SpO2: 94% 98%    Last Pain:  Vitals:   01/21/22 1932  TempSrc:   PainSc: [place]

## 2022-01-21 NOTE — Assessment & Plan Note (Addendum)
-  Continue aspirin, Lipitor 
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
## 2022-01-21 NOTE — Assessment & Plan Note (Addendum)
Lipitor 

## 2022-01-21 NOTE — Assessment & Plan Note (Signed)
-   Continue home medications 

## 2022-01-21 NOTE — Consult Note (Addendum)
[facility] Nurse Consult Note: Reason for Consult: Consult to provide topical treatment recommendations for left foot. Performed remotely after review of progress notes and photos in the EMR. Full thickness wound to left foot where previous surgery was performed, pt with osteomyelitis, according to progress notes.  Pt has been followed by Dr Cleda Mccreedy of the podiatry team prior to admission, and a consult is pending.  According to primary team progress notes; "spoke with Dr. Cleda Mccreedy recommends starting patient on antibiotics.  I have ordered IV vancomycin we will send blood cultures.  We will admit to the hospitalist service they plan to operate either after 5 PM today or tomorrow morning.  We will discuss with the hospitalist for admission." Topical treatment orders provided for bedside nurses to perform as follows: Apply moist gauze dressing to left foot Q day until further orders are available from the podiatry service. Please refer to podiatry team for further plan of care.  Please re-consult if further assistance is needed.  Thank-you,  Julien Girt MSN, [place], [place], [place], [place]

## 2022-01-21 NOTE — Anesthesia Preprocedure Evaluation (Addendum)
Anesthesia Evaluation  Patient identified by MRN, date of birth, ID band Patient awake    Reviewed: Allergy & Precautions, H&P , NPO status , Patient's Chart, lab work & pertinent test results, reviewed documented beta blocker date and time   Airway Mallampati: I  TM Distance: >3 FB Neck ROM: full    Dental  (+) Edentulous Upper, Edentulous Lower   Pulmonary shortness of breath and with exertion, asthma , sleep apnea , pneumonia, resolved,    Pulmonary exam normal        Cardiovascular Exercise Tolerance: Poor hypertension, On Medications + angina with exertion + CAD, + Past MI and + Cardiac Stents  Normal cardiovascular exam Rate:Normal  LHC 06/27/2015: 90% pLAD; PCI performed placing 3.5 x 18 mm Xience Alpine DES x 1. b.) LHC 06/25/2016: EF 55%; no obstructive CAD; patent stent to LAD.   Neuro/Psych Anxiety Depression  Neuromuscular disease negative psych ROS   GI/Hepatic Neg liver ROS, GERD  Medicated and Controlled,  Endo/Other  diabetesHypothyroidism   Renal/GU Renal disease  negative genitourinary   Musculoskeletal  (+) Arthritis ,   Abdominal (+) + obese,   Peds  Hematology  (+) Blood dyscrasia, anemia ,   Anesthesia Other Findings   Reproductive/Obstetrics negative OB ROS                            Anesthesia Physical  Anesthesia Plan  ASA: 3  Anesthesia Plan: General   Post-op Pain Management: Regional block*, Dilaudid IV, Toradol IV (intra-op)* and Ofirmev IV (intra-op)*   Induction: Intravenous  PONV Risk Score and Plan: 3  Airway Management Planned: LMA  Additional Equipment:   Intra-op Plan:   Post-operative Plan: Extubation in OR  Informed Consent: I have reviewed the patients History and Physical, chart, labs and discussed the procedure including the risks, benefits and alternatives for the proposed anesthesia with the patient or authorized representative who  has indicated his/her understanding and acceptance.     Dental advisory given  Plan Discussed with: CRNA  Anesthesia Plan Comments:        Anesthesia Quick Evaluation

## 2022-01-21 NOTE — ED Triage Notes (Signed)
Patient reports he was seen by Dr. Caryl Comes and infectious disease for left foot wound with recent toe amputations and sent to ER to have more amputated
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
## 2022-01-21 NOTE — Assessment & Plan Note (Signed)
Pt has an open wound with mild surrounding erythema.  No fever or leukocytosis.  Consulted to Dr. Delaine Lame for ID, who recommended Zosyn. Consulted Dr. Cleda Mccreedy of podiatry  - Admitted to [place] bed as inpatient - Empiric antimicrobial treatment with Zosyn (patient received 1 dose of vancomycin in ED) - PRN Zofran for nausea, Tylenol and oxycodone for pain - Blood cultures x 2  - ESR and CRP - wound care consult - INR/PTT

## 2022-01-21 NOTE — Assessment & Plan Note (Addendum)
Synthroid 

## 2022-01-21 NOTE — Anesthesia Procedure Notes (Signed)
Procedure Name: Intubation Date/Time: 01/21/2022 5:20 PM  Performed by: Kelton Pillar, CRNAPre-anesthesia Checklist: Patient identified, Emergency Drugs available, Suction available and Patient being monitored Patient Re-evaluated:Patient Re-evaluated prior to induction Oxygen Delivery Method: Circle system utilized Preoxygenation: Pre-oxygenation with 100% oxygen Induction Type: IV induction Ventilation: Mask ventilation without difficulty Laryngoscope Size: McGraph and 4 Grade View: Grade I Tube type: Oral Tube size: 7.5 mm Number of attempts: 1 Airway Equipment and Method: Stylet and Oral airway Placement Confirmation: ETT inserted through vocal cords under direct vision, positive ETCO2, breath sounds checked- equal and bilateral and CO2 detector Secured at: 21 cm Tube secured with: Tape Dental Injury: Teeth and Oropharynx as per pre-operative assessment

## 2022-01-21 NOTE — Consult Note (Signed)
Pharmacy Antibiotic Note  Charles [name] is a 61 y.o. male admitted on 01/21/2022 with  chronic osteomyelitis of the left foot .  Pharmacy has been consulted for Zosyn dosing.  Plan: Zosyn 3.375g IV q8h (4 hour infusion).  Height: '6\' 1"'$  (185.4 cm) Weight: 108.9 kg (240 lb) IBW/kg (Calculated) : 79.9  Temp (24hrs), Avg:97.8 F (36.6 C), Min:97.5 F (36.4 C), Max:98.1 F (36.7 C)  Recent Labs  Lab 01/21/22 0717  WBC 6.0  CREATININE 1.13    Estimated Creatinine Clearance: 88.8 mL/min (by C-G formula based on SCr of 1.13 mg/dL).    Allergies  Allergen Reactions   Lisinopril Cough    Antimicrobials this admission: Zosyn 10/4 >>  Vancomycin 10/4 x 1  Dose adjustments this admission: N/A  Microbiology results: 10/4 BCx: pending 9/19 Wx (left foot): Enterobacter cloacae and Pseudomonas aeriginosa  Thank you for allowing pharmacy to be a part of this patient's care.  Lorin Picket 01/21/2022 10:08 AM

## 2022-01-21 NOTE — Assessment & Plan Note (Signed)
-  continue home as needed oxycodone -As needed Tylenol
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
## 2022-01-21 NOTE — Transfer of Care (Signed)
Immediate Anesthesia Transfer of Care Note  Patient: Charles [name]  Procedure(s) Performed: REVISION TRANSMETATARSAL AMPUTATION (Left: Toe)  Patient Location: PACU  Anesthesia Type:General  Level of Consciousness: awake, alert  and oriented  Airway & Oxygen Therapy: Patient Spontanous Breathing and Patient connected to face mask oxygen  Post-op Assessment: Report given to RN and Post -op Vital signs reviewed and stable  Post vital signs: Reviewed and stable  Last Vitals:  Vitals Value Taken Time  BP 133/74 01/21/22 1853  Temp    Pulse 75 01/21/22 1900  Resp 14 01/21/22 1900  SpO2 99 % 01/21/22 1900  Vitals shown include unvalidated device data.  Last Pain:  Vitals:   01/21/22 1638  TempSrc: Oral  PainSc: 6       Patients Stated Pain Goal: 0 (94/76/54 6503)  Complications: No notable events documented.

## 2022-01-21 NOTE — ED Notes (Signed)
Rn aware of bed assigned 
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
## 2022-01-21 NOTE — Interval H&P Note (Signed)
History and Physical Interval Note:  01/21/2022 5:07 PM  Charles [name]  has presented today for surgery, with the diagnosis of osteomyelitis.  The various methods of treatment have been discussed with the patient and family. After consideration of risks, benefits and other options for treatment, the patient has consented to  Procedure(s): REVISION TRANSMETATARSAL AMPUTATION (Left) as a surgical intervention.  The patient's history has been reviewed, patient examined, no change in status, stable for surgery.  I have reviewed the patient's chart and labs.  Questions were answered to the patient's satisfaction.     Durward Fortes

## 2022-01-22 ENCOUNTER — Encounter: Payer: Self-pay | Admitting: Podiatry

## 2022-01-22 DIAGNOSIS — L089 Local infection of the skin and subcutaneous tissue, unspecified: Secondary | ICD-10-CM

## 2022-01-22 DIAGNOSIS — M86672 Other chronic osteomyelitis, left ankle and foot: Secondary | ICD-10-CM | POA: Diagnosis not present

## 2022-01-22 LAB — BASIC METABOLIC PANEL
Anion gap: 8 (ref 5–15)
BUN: 21 mg/dL (ref 8–23)
CO2: 26 mmol/L (ref 22–32)
Calcium: 8.8 mg/dL — ABNORMAL LOW (ref 8.9–10.3)
Chloride: 105 mmol/L (ref 98–111)
Creatinine, Ser: 0.89 mg/dL (ref 0.61–1.24)
GFR, Estimated: >60 mL/min (ref >60)
Glucose, Bld: 171 mg/dL — ABNORMAL HIGH (ref 70–99)
Potassium: 4.7 mmol/L (ref 3.5–5.1)
Sodium: 139 mmol/L (ref 135–145)

## 2022-01-22 LAB — GLUCOSE, CAPILLARY
Glucose-Capillary: 141 mg/dL — ABNORMAL HIGH (ref 70–99)
Glucose-Capillary: 138 mg/dL — ABNORMAL HIGH (ref 70–99)

## 2022-01-22 LAB — CBC
HCT: 34.9 % — ABNORMAL LOW (ref 39.0–52.0)
Hemoglobin: 10.9 g/dL — ABNORMAL LOW (ref 13.0–17.0)
MCH: 28.8 pg (ref 26.0–34.0)
MCHC: 31.2 g/dL (ref 30.0–36.0)
MCV: 92.3 fL (ref 80.0–100.0)
Platelets: 329 10*3/uL (ref 150–400)
RBC: 3.78 MIL/uL — ABNORMAL LOW (ref 4.22–5.81)
RDW: 15.5 % (ref 11.5–15.5)
WBC: 6.5 10*3/uL (ref 4.0–10.5)
nRBC: 0 % (ref 0.0–0.2)

## 2022-01-22 MED ORDER — CIPROFLOXACIN HCL 500 MG PO TABS: 500.0000 mg | ORAL_TABLET | Freq: Two times a day (BID) | ORAL | Status: DC

## 2022-01-22 MED ORDER — LISDEXAMFETAMINE DIMESYLATE 20 MG PO CAPS
40.0000 mg | ORAL_CAPSULE | Freq: Every day | ORAL | Status: DC
  Administered 2022-01-22: 40 mg via ORAL
  Filled 2022-01-22: qty 2

## 2022-01-22 MED ORDER — OXYCODONE HCL 10 MG PO TABS: 10.0000 mg | ORAL_TABLET | ORAL | 0 refills | Status: DC | PRN

## 2022-01-22 MED ORDER — AMOXICILLIN-POT CLAVULANATE 875-125 MG PO TABS: 1.0000 | ORAL_TABLET | Freq: Two times a day (BID) | ORAL | 0 refills | Status: AC

## 2022-01-22 MED ORDER — CIPROFLOXACIN HCL 500 MG PO TABS: 500.0000 mg | ORAL_TABLET | Freq: Two times a day (BID) | ORAL | 0 refills | Status: AC

## 2022-01-22 NOTE — Progress Notes (Signed)
1 Day Post-Op   Subjective/Chief Complaint: Patient seen.  Some pain in the foot but manageable with medication   Objective: Vital signs in last 24 hours: Temp:  [97.2 F (36.2 C)-98.4 F (36.9 C)] 97.9 F (36.6 C) (10/05 0745) Pulse Rate:  [62-87] 62 (10/05 0745) Resp:  [13-20] 16 (10/05 0745) BP: (129-165)/(74-99) 134/75 (10/05 0745) SpO2:  [94 %-100 %] 98 % (10/05 0745) Weight:  [108.9 kg] 108.9 kg (10/04 1638) Last BM Date : 01/21/22  Intake/Output from previous day: 10/04 0701 - 10/05 0700 In: 1345.4 [P.O.:125; I.V.:825; IV Piggyback:395.4] Out: 1660 [Urine:1650; Blood:10] Intake/Output this shift: Total I/O In: 300 [P.O.:300] Out: 300 [Urine:300]  Significant bleeding and strikethrough noted on the bandaging.  Upon removal the incision is well coapted with all wound edges viable.  Significant reduction in erythema and edema of the left foot.      Lab Results:  Recent Labs    01/21/22 0717 01/22/22 0339  WBC 6.0 6.5  HGB 11.2* 10.9*  HCT 35.8* 34.9*  PLT 361 329   BMET Recent Labs    01/21/22 0717 01/22/22 0339  NA 137 139  K 4.6 4.7  CL 109 105  CO2 24 26  GLUCOSE 105* 171*  BUN 27* 21  CREATININE 1.13 0.89  CALCIUM 8.8* 8.8*   PT/INR Recent Labs    01/21/22 0900  LABPROT 13.7  INR 1.1   ABG No results for input(s): "PHART", "HCO3" in the last 72 hours.  Invalid input(s): "PCO2", "PO2"  Studies/Results: DG MINI C-ARM IMAGE ONLY  Result Date: 01/21/2022 There is no interpretation for this exam.  This order is for images obtained during a surgical procedure.  Please See "Surgeries" Tab for more information regarding the procedure.   DG Foot Complete Left  Result Date: 01/21/2022 CLINICAL DATA:  Infection EXAM: LEFT FOOT - COMPLETE 3 VIEW COMPARISON:  Radiograph dated August 10, 2021 FINDINGS: Partial amputation of the first through fifth metatarsals. Osseous destruction of the distal first metatarsal at the osteotomy site. Soft tissue  edema, most pronounced distally at the area of the osteotomy sites. IMPRESSION: Osseous destruction of the distal first metatarsal at the osteotomy site, compatible with osteomyelitis. Could be further assessed with MRI. Electronically Signed   By: Yetta Glassman M.D.   On: 01/21/2022 08:14    Anti-infectives: Anti-infectives (From admission, onward)    Start     Dose/Rate Route Frequency Ordered Stop   01/21/22 1030  piperacillin-tazobactam (ZOSYN) IVPB 3.375 g        3.375 g 12.5 mL/hr over 240 Minutes Intravenous Every 8 hours 01/21/22 1007     01/21/22 0815  vancomycin (VANCOCIN) IVPB 1000 mg/200 mL premix        1,000 mg 200 mL/hr over 60 Minutes Intravenous  Once 01/21/22 0803 01/21/22 1014       Assessment/Plan: s/p Procedure(s): REVISION TRANSMETATARSAL AMPUTATION (Left) Assessment: Stable status post transmetatarsal amputation left foot.  Plan: Betadine gauze applied to the incision followed by a bulky sterile bandage.  Patient may now be allowed bathroom privileges with a walker with pressure only on the left heel in the wedge surgical shoe.  Plan for a dressing change tomorrow and patient should be stable for discharge pending antibiotic recommendations per infectious disease.  LOS: 1 day    Durward Fortes 01/22/2022
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
## 2022-01-22 NOTE — Progress Notes (Signed)
PROGRESS NOTE    Charles [name]  DTO:[id number] DOB: [birth year] DOA: 01/21/2022 PCP: Mylinda Latina, PA-C    Brief Narrative:   61 y.o. male with medical history significant of hypertension, hyperlipidemia, asthma, hypothyroidism, depression with anxiety, chronic pain syndrome, s/p for spinal cord stimulator, OSA not on CPAP, perforated gastric ulcer, CAD, myocardial infarction, ADHD, kidney stone, obesity, s/p of gastric bypass surgery, who presents with left foot wound with infection.   Patient was recently hospitalized from 8/23 - 8/28 due to left foot osteomyelitis. P underwent TMA of left foot on 12/12/21. Culture was positive for MSSA and proteus. He was discharged on 12/15/21 on cefazolin. He finished 4 weeks of IV antibiotic treatment with cefazolin. He states that he tripped on his dog, injured his left foot, causing dehiscence of left foot surgical site.  Patient has mild pain in left foot, no fever or chills. Patient does not have chest pain, cough, shortness breath.  No nausea, vomiting, diarrhea or abdominal pain.  No symptoms of UTI.   Pt has been following up with Dr. Cleda Mccreedy of podiatry who has been watching the wound and packing the wound. Dr. Cleda Mccreedy is concerned for wound infection and nonhealing ulceration. Dr. Cleda Mccreedy recommended admission and further surgical treatment.  Status post the OR with podiatry on 10/4 for revision of left TMA.  Tolerated procedure well.  Minimal blood loss.  Bone sample sent for pathology.   Assessment & Plan:   Principal Problem:   Chronic osteomyelitis of left foot (HCC) Active Problems:   Chronic pain   Hypothyroidism   Essential hypertension   CAD S/P percutaneous coronary angioplasty   Obesity (BMI 30-39.9)   HLD (hyperlipidemia)   Depression with anxiety  * Chronic osteomyelitis of left foot (HCC) Pt has an open wound with mild surrounding erythema.  No fever or leukocytosis.  Consulted to Dr. Delaine Lame for ID, who recommended  Zosyn. Consulted Dr. Cleda Mccreedy of podiatry.  Patient status post surgical revision of left TMA on 10/4 Plan: Request therapy evaluations Multimodal pain control Continue Zosyn for now Infectious disease consulted Podiatry follow-up Wound care    Chronic pain -continue home as needed oxycodone -As needed Tylenol   Hypothyroidism - Synthroid   Essential hypertension - Amlodipine, Coreg, -IV hydralazine prn   CAD S/P percutaneous coronary angioplasty - Continue aspirin, Lipitor   Depression with anxiety - Continue home medications   HLD (hyperlipidemia) PTA Lipitor    Obesity (BMI 30-39.9) BMI= 31.66   and BW= 108.9Kg. s/p of gastric bypass -Diet and exercise.   -Encourage to lose weight.  DVT prophylaxis: SQ heparin Code Status: Full Family Communication: None, offered to call the patient declined Disposition Plan: Status is: Inpatient Remains inpatient appropriate because: Wound dehiscence, concern for infection on IV antibiotics   Level of care: Med-Surg  Consultants:  ID Podiatry  Procedures:  Left TMA revision 10/4  Antimicrobials: Zosyn   Subjective: Seen and examined.  Resting comfortably in bed.  No visible distress.  No complaints of pain.  Objective: Vitals:   01/21/22 1950 01/21/22 2335 01/22/22 0433 01/22/22 0745  BP: (!) 164/99 137/82 129/77 134/75  Pulse: 73 87 64 62  Resp: '20 20 20 16  '$ Temp: 97.9 F (36.6 C) 97.9 F (36.6 C) 97.6 F (36.4 C) 97.9 F (36.6 C)  TempSrc:      SpO2: 98% 96% 96% 98%  Weight:      Height:        Intake/Output Summary (Last 24 hours) at  01/22/2022 1052 Last data filed at 01/22/2022 0442 Gross per 24 hour  Intake 1150 ml  Output 1660 ml  Net -510 ml   Filed Weights   01/21/22 0712 01/21/22 1638  Weight: 108.9 kg 108.9 kg    Examination:  General exam: Appears calm and comfortable  Respiratory system: Clear to auscultation. Respiratory effort normal. Cardiovascular system: S1-S2, RRR, no  murmurs, no pedal edema Gastrointestinal system: Obese, soft, NT/ND, normal bowel sounds Central nervous system: Alert and oriented. No focal neurological deficits. Extremities: Status post left TMA revision.  Left foot in surgical wraps, not removed Skin: No rashes, lesions or ulcers Psychiatry: Judgement and insight appear normal. Mood & affect appropriate.     Data Reviewed: I have personally reviewed following labs and imaging studies  CBC: Recent Labs  Lab 01/21/22 0717 01/22/22 0339  WBC 6.0 6.5  NEUTROABS 2.6  --   HGB 11.2* 10.9*  HCT 35.8* 34.9*  MCV 92.3 92.3  PLT 361 882   Basic Metabolic Panel: Recent Labs  Lab 01/21/22 0717 01/22/22 0339  NA 137 139  K 4.6 4.7  CL 109 105  CO2 24 26  GLUCOSE 105* 171*  BUN 27* 21  CREATININE 1.13 0.89  CALCIUM 8.8* 8.8*   GFR: Estimated Creatinine Clearance: 112.8 mL/min (by C-G formula based on SCr of 0.89 mg/dL). Liver Function Tests: Recent Labs  Lab 01/21/22 0717  AST 32  ALT 13  ALKPHOS 119  BILITOT 0.5  PROT 7.6  ALBUMIN 3.4*   No results for input(s): "LIPASE", "AMYLASE" in the last 168 hours. No results for input(s): "AMMONIA" in the last 168 hours. Coagulation Profile: Recent Labs  Lab 01/21/22 0900  INR 1.1   Cardiac Enzymes: No results for input(s): "CKTOTAL", "CKMB", "CKMBINDEX", "TROPONINI" in the last 168 hours. BNP (last 3 results) No results for input(s): "PROBNP" in the last 8760 hours. HbA1C: No results for input(s): "HGBA1C" in the last 72 hours. CBG: Recent Labs  Lab 01/22/22 0747  GLUCAP 141*   Lipid Profile: No results for input(s): "CHOL", "HDL", "LDLCALC", "TRIG", "CHOLHDL", "LDLDIRECT" in the last 72 hours. Thyroid Function Tests: No results for input(s): "TSH", "T4TOTAL", "FREET4", "T3FREE", "THYROIDAB" in the last 72 hours. Anemia Panel: No results for input(s): "VITAMINB12", "FOLATE", "FERRITIN", "TIBC", "IRON", "RETICCTPCT" in the last 72 hours. Sepsis Labs: No  results for input(s): "PROCALCITON", "LATICACIDVEN" in the last 168 hours.  Recent Results (from the past 240 hour(s))  Blood culture (routine x 2)     Status: None (Preliminary result)   Collection Time: 01/21/22  9:07 AM   Specimen: BLOOD LEFT ARM  Result Value Ref Range Status   Specimen Description BLOOD LEFT ARM  Final   Special Requests   Final    BOTTLES DRAWN AEROBIC AND ANAEROBIC Blood Culture results may not be optimal due to an excessive volume of blood received in culture bottles   Culture   Final    NO GROWTH < 24 HOURS Performed at [facility], [address] [place], [place] [address]    Report Status PENDING  Incomplete  SARS Coronavirus 2 by RT PCR (hospital order, performed in [facility] hospital lab) *cepheid single result test* Anterior Nasal Swab     Status: None   Collection Time: 01/21/22  9:07 AM   Specimen: Anterior Nasal Swab  Result Value Ref Range Status   SARS Coronavirus 2 by RT PCR NEGATIVE NEGATIVE Final    Comment: (NOTE) SARS-CoV-2 target nucleic acids are NOT DETECTED.  The SARS-CoV-2 RNA is generally detectable in upper and lower respiratory specimens during the acute phase of infection. The lowest concentration of SARS-CoV-2 viral copies this assay can detect is 250 copies / mL. A negative result does not preclude SARS-CoV-2 infection and should not be used as the sole basis for treatment or other patient management decisions.  A negative result may occur with improper specimen collection / handling, submission of specimen other than nasopharyngeal swab, presence of viral mutation(s) within the areas targeted by this assay, and inadequate number of viral copies (<250 copies / mL). A negative result must be combined with clinical observations, patient history, and epidemiological information.  Fact Sheet for Patients:   https://www.patel.info/  Fact Sheet for Healthcare  Providers: https://hall.com/  This test is not yet approved or  cleared by the [place] FDA and has been authorized for detection and/or diagnosis of SARS-CoV-2 by FDA under an Emergency Use Authorization (EUA).  This EUA will remain in effect (meaning this test can be used) for the duration of the COVID-19 declaration under Section 564(b)(1) of the Act, 21 U.S.C. section 360bbb-3(b)(1), unless the authorization is terminated or revoked sooner.  Performed at [facility], [address] [place], [place] [address]   Blood culture (routine x 2)     Status: None (Preliminary result)   Collection Time: 01/21/22  9:08 AM   Specimen: BLOOD RIGHT ARM  Result Value Ref Range Status   Specimen Description BLOOD RIGHT ARM  Final   Special Requests   Final    BOTTLES DRAWN AEROBIC AND ANAEROBIC Blood Culture results may not be optimal due to an excessive volume of blood received in culture bottles   Culture   Final    NO GROWTH < 24 HOURS Performed at [facility], [address] [place], [place] [address]    Report Status PENDING  Incomplete  Aerobic/Anaerobic Culture w Gram Stain (surgical/deep wound)     Status: None (Preliminary result)   Collection Time: 01/21/22  6:12 PM   Specimen: PATH Other; Tissue  Result Value Ref Range Status   Specimen Description   Final    BONE LEFT FOOT Performed at [facility], [place] [address], [place], [place] [address]    Special Requests   Final    NONE Performed at [facility], [place], [place] [address]    Gram Stain NO WBC SEEN NO ORGANISMS SEEN   Final   Culture   Final    NO GROWTH < 12 HOURS Performed at [facility], [place] [address], [place], [place] [address]    Report Status PENDING  Incomplete         Radiology Studies: DG MINI C-ARM IMAGE ONLY  Result Date: 01/21/2022 There is no interpretation for this exam.  This order is for images  obtained during a surgical procedure.  Please See "Surgeries" Tab for more information regarding the procedure.   DG Foot Complete Left  Result Date: 01/21/2022 CLINICAL DATA:  Infection EXAM: LEFT FOOT - COMPLETE 3 VIEW COMPARISON:  Radiograph dated August 10, 2021 FINDINGS: Partial amputation of the first through fifth metatarsals. Osseous destruction of the distal first metatarsal at the osteotomy site. Soft tissue edema, most pronounced distally at the area of the osteotomy sites. IMPRESSION: Osseous destruction of the distal first metatarsal at the osteotomy site, compatible with osteomyelitis. Could be further assessed with MRI. Electronically Signed   By: Hosie Poisson.D.  On: 01/21/2022 08:14        Scheduled Meds:  amLODipine  5 mg Oral Daily   ascorbic acid  500 mg Oral Daily   aspirin EC  81 mg Oral Daily   atorvastatin  40 mg Oral QHS   buPROPion  150 mg Oral Daily   carvedilol  3.125 mg Oral BID WC   vitamin D3  2,000 Units Oral Daily   DULoxetine  60 mg Oral Daily   heparin  5,000 Units Subcutaneous Q8H   levothyroxine  75 mcg Oral Q0600   lisdexamfetamine  40 mg Oral Daily   metaxalone  1,600 mg Oral TID   zinc sulfate  220 mg Oral Daily   Continuous Infusions:  piperacillin-tazobactam (ZOSYN)  IV 3.375 g (01/22/22 0538)     LOS: 1 day     Sidney Ace, MD [facility]   If 7PM-7AM, please contact night-coverage  01/22/2022, 10:52 AM

## 2022-01-22 NOTE — Discharge Summary (Signed)
Physician Discharge Summary  Charles [name] WGN:[id number] DOB: 1961/01/04 DOA: 01/21/2022  PCP: Mylinda Latina, PA-C  Admit date: 01/21/2022 Discharge date: 01/22/2022  Admitted From: Home Disposition:  Home  Recommendations for Outpatient Follow-up:  Follow up with PCP in 1-2 weeks Follow up with podiatry Follow up with ID  Home Health:No  Equipment/Devices:None   Discharge Condition:Stable  CODE STATUS:FULL  Diet recommendation: Reg  Brief/Interim Summary:   61 y.o. male with medical history significant of hypertension, hyperlipidemia, asthma, hypothyroidism, depression with anxiety, chronic pain syndrome, s/p for spinal cord stimulator, OSA not on CPAP, perforated gastric ulcer, CAD, myocardial infarction, ADHD, kidney stone, obesity, s/p of gastric bypass surgery, who presents with left foot wound with infection.   Patient was recently hospitalized from 8/23 - 8/28 due to left foot osteomyelitis. P underwent TMA of left foot on 12/12/21. Culture was positive for MSSA and proteus. He was discharged on 12/15/21 on cefazolin. He finished 4 weeks of IV antibiotic treatment with cefazolin. He states that he tripped on his dog, injured his left foot, causing dehiscence of left foot surgical site.  Patient has mild pain in left foot, no fever or chills. Patient does not have chest pain, cough, shortness breath.  No nausea, vomiting, diarrhea or abdominal pain.  No symptoms of UTI.   Pt has been following up with Dr. Cleda Mccreedy of podiatry who has been watching the wound and packing the wound. Dr. Cleda Mccreedy is concerned for wound infection and nonhealing ulceration. Dr. Cleda Mccreedy recommended admission and further surgical treatment.   Status post the OR with podiatry on 10/4 for revision of left TMA.  Tolerated procedure well.  Minimal blood loss.  Bone sample sent for pathology.  Case discussed with ID and podiatry.  No need for IV abx.  Will prescribe cipro 500 BID and augmentin 875/125 BID x 14  days on dc.  PRN oxycodone prescribed.  Follow up outpatient ID and podiatry.  Stable for dc.  Discharge Diagnoses:  Principal Problem:   Chronic osteomyelitis of left foot (HCC) Active Problems:   Chronic pain   Hypothyroidism   Essential hypertension   CAD S/P percutaneous coronary angioplasty   Obesity (BMI 30-39.9)   HLD (hyperlipidemia)   Depression with anxiety  * Chronic osteomyelitis of left foot (HCC) Pt has an open wound with mild surrounding erythema.  No fever or leukocytosis.  Consulted to Dr. Delaine Lame for ID, who recommended Zosyn. Consulted Dr. Cleda Mccreedy of podiatry.  Patient status post surgical revision of left TMA on 10/4 Plan: DC home.  Cipro 500 bid and augmentin 875/125 BID x 14 days.  PRN oxycodone Follow up outpatient ID and podiatry  Discharge Instructions   Allergies as of 01/22/2022       Reactions   Lisinopril Cough        Medication List     STOP taking these medications    diclofenac 50 MG tablet Commonly known as: CATAFLAM   furosemide 20 MG tablet Commonly known as: LASIX       TAKE these medications    albuterol 108 (90 Base) MCG/ACT inhaler Commonly known as: VENTOLIN HFA Inhale 2 puffs into the lungs every 6 (six) hours as needed for wheezing or shortness of breath.   amLODipine 5 MG tablet Commonly known as: NORVASC Take 1 tablet (5 mg total) by mouth daily.   ascorbic acid 500 MG tablet Commonly known as: VITAMIN C Take 1 tablet (500 mg total) by mouth daily.   aspirin EC 81  MG tablet Take 81 mg by mouth daily. Swallow whole.   atorvastatin 40 MG tablet Commonly known as: LIPITOR Take one tab at bed time for cholesterol   buPROPion 150 MG 24 hr tablet Commonly known as: WELLBUTRIN XL TAKE 1 TABLET BY MOUTH DAILY.   carvedilol 3.125 MG tablet Commonly known as: COREG TAKE ONE (1) TABLET BY MOUTH TWO TIMES PER DAY WITH MEALS   ciprofloxacin 500 MG tablet Commonly known as: CIPRO Take 1 tablet (500 mg total) by  mouth 2 (two) times daily for 14 days.   DULoxetine 60 MG capsule Commonly known as: CYMBALTA TAKE 1 CAPSULE BY MOUTH DAILY.   levothyroxine 75 MCG tablet Commonly known as: SYNTHROID TAKE ONE TABLET BY MOUTH EVERY DAY WITH BREAKFAST   lisdexamfetamine 40 MG capsule Commonly known as: Vyvanse Take 1 capsule (40 mg total) by mouth every morning.   lisdexamfetamine 40 MG capsule Commonly known as: Vyvanse Take 1 capsule (40 mg total) by mouth every morning.   metaxalone 800 MG tablet Commonly known as: SKELAXIN Take 1,600 mg by mouth 3 (three) times daily.   Oxycodone HCl 10 MG Tabs Take 1 tablet (10 mg total) by mouth every 4 (four) hours as needed. What changed:  when to take this reasons to take this Another medication with the same name was removed. Continue taking this medication, and follow the directions you see here.   sildenafil 20 MG tablet Commonly known as: REVATIO TAKE 1 TABLET BY MOUTH DAILY AS NEEDED FOR ERECTILE DYSFUNCTION   vitamin D3 25 MCG tablet Commonly known as: CHOLECALCIFEROL Take 2 tablets (2,000 Units total) by mouth daily.   zinc sulfate 220 (50 Zn) MG capsule Take 1 capsule (220 mg total) by mouth daily.   zolpidem 10 MG tablet Commonly known as: AMBIEN TAKE 1 TABLET BY MOUTH AT BEDTIME AS NEEDED FOR SLEEP        Allergies  Allergen Reactions   Lisinopril Cough    Consultations: Podiatry ID   Procedures/Studies: DG MINI C-ARM IMAGE ONLY  Result Date: 01/21/2022 There is no interpretation for this exam.  This order is for images obtained during a surgical procedure.  Please See "Surgeries" Tab for more information regarding the procedure.   DG Foot Complete Left  Result Date: 01/21/2022 CLINICAL DATA:  Infection EXAM: LEFT FOOT - COMPLETE 3 VIEW COMPARISON:  Radiograph dated August 10, 2021 FINDINGS: Partial amputation of the first through fifth metatarsals. Osseous destruction of the distal first metatarsal at the osteotomy  site. Soft tissue edema, most pronounced distally at the area of the osteotomy sites. IMPRESSION: Osseous destruction of the distal first metatarsal at the osteotomy site, compatible with osteomyelitis. Could be further assessed with MRI. Electronically Signed   By: Yetta Glassman M.D.   On: 01/21/2022 08:14      Subjective: Seen and examined on day of dc.  Stable, pain well controlled.  Wound evaluated by ID and podiatry.  Appropriate for dc home.  Discharge Exam: Vitals:   01/22/22 0433 01/22/22 0745  BP: 129/77 134/75  Pulse: 64 62  Resp: 20 16  Temp: 97.6 F (36.4 C) 97.9 F (36.6 C)  SpO2: 96% 98%   Vitals:   01/21/22 1950 01/21/22 2335 01/22/22 0433 01/22/22 0745  BP: (!) 164/99 137/82 129/77 134/75  Pulse: 73 87 64 62  Resp: '20 20 20 16  '$ Temp: 97.9 F (36.6 C) 97.9 F (36.6 C) 97.6 F (36.4 C) 97.9 F (36.6 C)  TempSrc:  SpO2: 98% 96% 96% 98%  Weight:      Height:        General: Pt is alert, awake, not in acute distress Cardiovascular: RRR, S1/S2 +, no rubs, no gallops Respiratory: CTA bilaterally, no wheezing, no rhonchi Abdominal: Soft, NT, ND, bowel sounds + Extremities: S/p left foot TMA    The results of significant diagnostics from this hospitalization (including imaging, microbiology, ancillary and laboratory) are listed below for reference.     Microbiology: Recent Results (from the past 240 hour(s))  Blood culture (routine x 2)     Status: None (Preliminary result)   Collection Time: 01/21/22  9:07 AM   Specimen: BLOOD LEFT ARM  Result Value Ref Range Status   Specimen Description BLOOD LEFT ARM  Final   Special Requests   Final    BOTTLES DRAWN AEROBIC AND ANAEROBIC Blood Culture results may not be optimal due to an excessive volume of blood received in culture bottles   Culture   Final    NO GROWTH < 24 HOURS Performed at [facility], [address] [place], [place] [address]    Report Status PENDING  Incomplete  SARS  Coronavirus 2 by RT PCR (hospital order, performed in [facility] hospital lab) *cepheid single result test* Anterior Nasal Swab     Status: None   Collection Time: 01/21/22  9:07 AM   Specimen: Anterior Nasal Swab  Result Value Ref Range Status   SARS Coronavirus 2 by RT PCR NEGATIVE NEGATIVE Final    Comment: (NOTE) SARS-CoV-2 target nucleic acids are NOT DETECTED.  The SARS-CoV-2 RNA is generally detectable in upper and lower respiratory specimens during the acute phase of infection. The lowest concentration of SARS-CoV-2 viral copies this assay can detect is 250 copies / mL. A negative result does not preclude SARS-CoV-2 infection and should not be used as the sole basis for treatment or other patient management decisions.  A negative result may occur with improper specimen collection / handling, submission of specimen other than nasopharyngeal swab, presence of viral mutation(s) within the areas targeted by this assay, and inadequate number of viral copies (<250 copies / mL). A negative result must be combined with clinical observations, patient history, and epidemiological information.  Fact Sheet for Patients:   https://www.patel.info/  Fact Sheet for Healthcare Providers: https://hall.com/  This test is not yet approved or  cleared by the [place] FDA and has been authorized for detection and/or diagnosis of SARS-CoV-2 by FDA under an Emergency Use Authorization (EUA).  This EUA will remain in effect (meaning this test can be used) for the duration of the COVID-19 declaration under Section 564(b)(1) of the Act, 21 U.S.C. section 360bbb-3(b)(1), unless the authorization is terminated or revoked sooner.  Performed at [facility], [address] [place], [place] [address]   Blood culture (routine x 2)     Status: None (Preliminary result)   Collection Time: 01/21/22  9:08 AM   Specimen: BLOOD RIGHT ARM  Result  Value Ref Range Status   Specimen Description BLOOD RIGHT ARM  Final   Special Requests   Final    BOTTLES DRAWN AEROBIC AND ANAEROBIC Blood Culture results may not be optimal due to an excessive volume of blood received in culture bottles   Culture   Final    NO GROWTH < 24 HOURS Performed at [facility], [address], [place], [place] [address]    Report Status PENDING  Incomplete  Aerobic/Anaerobic Culture w  Gram Stain (surgical/deep wound)     Status: None (Preliminary result)   Collection Time: 01/21/22  6:12 PM   Specimen: PATH Other; Tissue  Result Value Ref Range Status   Specimen Description   Final    BONE LEFT FOOT Performed at [facility], [place] [address] [place], [place] [address]    Special Requests   Final    NONE Performed at [facility], [place], [place] [address]    Gram Stain NO WBC SEEN NO ORGANISMS SEEN   Final   Culture   Final    NO GROWTH < 12 HOURS Performed at [facility], [place] [address] [place],  [address]    Report Status PENDING  Incomplete     Labs: BNP (last 3 results) No results for input(s): "BNP" in the last 8760 hours. Basic Metabolic Panel: Recent Labs  Lab 01/21/22 0717 01/22/22 0339  NA 137 139  K 4.6 4.7  CL 109 105  CO2 24 26  GLUCOSE 105* 171*  BUN 27* 21  CREATININE 1.13 0.89  CALCIUM 8.8* 8.8*   Liver Function Tests: Recent Labs  Lab 01/21/22 0717  AST 32  ALT 13  ALKPHOS 119  BILITOT 0.5  PROT 7.6  ALBUMIN 3.4*   No results for input(s): "LIPASE", "AMYLASE" in the last 168 hours. No results for input(s): "AMMONIA" in the last 168 hours. CBC: Recent Labs  Lab 01/21/22 0717 01/22/22 0339  WBC 6.0 6.5  NEUTROABS 2.6  --   HGB 11.2* 10.9*  HCT 35.8* 34.9*  MCV 92.3 92.3  PLT 361 329   Cardiac Enzymes: No results for input(s): "CKTOTAL", "CKMB", "CKMBINDEX", "TROPONINI" in the last 168 hours. BNP: Invalid input(s): "POCBNP" CBG: Recent Labs   Lab 01/22/22 0747 01/22/22 1206  GLUCAP 141* 138*   D-Dimer No results for input(s): "DDIMER" in the last 72 hours. Hgb A1c No results for input(s): "HGBA1C" in the last 72 hours. Lipid Profile No results for input(s): "CHOL", "HDL", "LDLCALC", "TRIG", "CHOLHDL", "LDLDIRECT" in the last 72 hours. Thyroid function studies No results for input(s): "TSH", "T4TOTAL", "T3FREE", "THYROIDAB" in the last 72 hours.  Invalid input(s): "FREET3" Anemia work up No results for input(s): "VITAMINB12", "FOLATE", "FERRITIN", "TIBC", "IRON", "RETICCTPCT" in the last 72 hours. Urinalysis    Component Value Date/Time   COLORURINE YELLOW (A) 10/19/2021 2216   APPEARANCEUR CLEAR (A) 10/19/2021 2216   APPEARANCEUR Cloudy (A) 11/21/2020 1106   LABSPEC 1.014 10/19/2021 2216   LABSPEC 1.004 01/27/2013 2020   PHURINE 5.0 10/19/2021 2216   GLUCOSEU NEGATIVE 10/19/2021 2216   GLUCOSEU Negative 01/27/2013 2020   HGBUR NEGATIVE 10/19/2021 2216   BILIRUBINUR NEGATIVE 10/19/2021 2216   BILIRUBINUR Negative 11/21/2020 1106   BILIRUBINUR Negative 01/27/2013 2020   KETONESUR NEGATIVE 10/19/2021 2216   PROTEINUR NEGATIVE 10/19/2021 2216   NITRITE NEGATIVE 10/19/2021 2216   LEUKOCYTESUR NEGATIVE 10/19/2021 2216   LEUKOCYTESUR Negative 01/27/2013 2020   Sepsis Labs Recent Labs  Lab 01/21/22 0717 01/22/22 0339  WBC 6.0 6.5   Microbiology Recent Results (from the past 240 hour(s))  Blood culture (routine x 2)     Status: None (Preliminary result)   Collection Time: 01/21/22  9:07 AM   Specimen: BLOOD LEFT ARM  Result Value Ref Range Status   Specimen Description BLOOD LEFT ARM  Final   Special Requests   Final    BOTTLES DRAWN AEROBIC AND ANAEROBIC Blood Culture results may not be optimal due to  an excessive volume of blood received in culture bottles   Culture   Final    NO GROWTH < 24 HOURS Performed at [facility], [address] [place], [place] [address]    Report Status PENDING   Incomplete  SARS Coronavirus 2 by RT PCR (hospital order, performed in [facility] hospital lab) *cepheid single result test* Anterior Nasal Swab     Status: None   Collection Time: 01/21/22  9:07 AM   Specimen: Anterior Nasal Swab  Result Value Ref Range Status   SARS Coronavirus 2 by RT PCR NEGATIVE NEGATIVE Final    Comment: (NOTE) SARS-CoV-2 target nucleic acids are NOT DETECTED.  The SARS-CoV-2 RNA is generally detectable in upper and lower respiratory specimens during the acute phase of infection. The lowest concentration of SARS-CoV-2 viral copies this assay can detect is 250 copies / mL. A negative result does not preclude SARS-CoV-2 infection and should not be used as the sole basis for treatment or other patient management decisions.  A negative result may occur with improper specimen collection / handling, submission of specimen other than nasopharyngeal swab, presence of viral mutation(s) within the areas targeted by this assay, and inadequate number of viral copies (<250 copies / mL). A negative result must be combined with clinical observations, patient history, and epidemiological information.  Fact Sheet for Patients:   https://www.patel.info/  Fact Sheet for Healthcare Providers: https://hall.com/  This test is not yet approved or  cleared by the [place] FDA and has been authorized for detection and/or diagnosis of SARS-CoV-2 by FDA under an Emergency Use Authorization (EUA).  This EUA will remain in effect (meaning this test can be used) for the duration of the COVID-19 declaration under Section 564(b)(1) of the Act, 21 U.S.C. section 360bbb-3(b)(1), unless the authorization is terminated or revoked sooner.  Performed at [facility], [address] [place], [place] [address]   Blood culture (routine x 2)     Status: None (Preliminary result)   Collection Time: 01/21/22  9:08 AM   Specimen: BLOOD  RIGHT ARM  Result Value Ref Range Status   Specimen Description BLOOD RIGHT ARM  Final   Special Requests   Final    BOTTLES DRAWN AEROBIC AND ANAEROBIC Blood Culture results may not be optimal due to an excessive volume of blood received in culture bottles   Culture   Final    NO GROWTH < 24 HOURS Performed at [facility], [address], [place], [place] [address]    Report Status PENDING  Incomplete  Aerobic/Anaerobic Culture w Gram Stain (surgical/deep wound)     Status: None (Preliminary result)   Collection Time: 01/21/22  6:12 PM   Specimen: PATH Other; Tissue  Result Value Ref Range Status   Specimen Description   Final    BONE LEFT FOOT Performed at [facility], [place] [address], [place], [place] [address]    Special Requests   Final    NONE Performed at [facility], [place], [place] [address]    Gram Stain NO WBC SEEN NO ORGANISMS SEEN   Final   Culture   Final    NO GROWTH < 12 HOURS Performed at [facility], [place] [address] [place], [place] [address]    Report Status PENDING  Incomplete     Time coordinating discharge: Over 30 minutes  SIGNED:   Sidney Ace, MD  [facility] 01/22/2022, 2:48 PM Pager  If 7PM-7AM, please contact night-coverage

## 2022-01-22 NOTE — TOC Progression Note (Signed)
Transition of Care [facility]) - Progression Note    Patient Details  Name: Charles [name] MRN: [id number] Date of Birth: 1960-05-15  Transition of [facility]) CM/SW [place], RN Phone Number: 01/22/2022, 3:09 PM  Clinical Narrative:     Patient lives with his wife. At baseline, he drives himself to appointments. PCP is [facility]. Pharmacy is [facility]. Patient has RW, wheelchair, crutches, and 3in1.  With his insurance I have not been able to locate a [facility] agency to accept him        Expected Discharge Plan and Services                                                 Social Determinants of Health (SDOH) Interventions    Readmission Risk Interventions    10/22/2021    1:14 PM 08/31/2021    9:49 AM  Readmission Risk Prevention Plan  Transportation Screening Complete Complete  PCP or Specialist Appt within 3-5 Days Complete Complete  HRI or [place]  Complete  Social Work Consult for [place] Planning/Counseling Complete Complete  Palliative Care Screening Not Applicable Not Applicable  Medication Review [employer]) Complete Complete

## 2022-01-22 NOTE — Plan of Care (Signed)
Patient discharged per MD orders at this time.All discharge instructions,education and medication reviewed with the patient.Pt expressed understanding and will comply with dc instructions.follow up appointments was also communicated to the patient.no verbal c/o or any ssx of distress.Pt was discharged home with self-care per order.Pt was transported home by wife in a privately owned vehicle.

## 2022-01-26 ENCOUNTER — Encounter: Payer: Self-pay | Admitting: Physician Assistant

## 2022-01-26 ENCOUNTER — Ambulatory Visit (INDEPENDENT_AMBULATORY_CARE_PROVIDER_SITE_OTHER): Payer: Managed Care, Other (non HMO) | Admitting: Physician Assistant

## 2022-01-26 DIAGNOSIS — L089 Local infection of the skin and subcutaneous tissue, unspecified: Secondary | ICD-10-CM

## 2022-01-26 DIAGNOSIS — I10 Essential (primary) hypertension: Secondary | ICD-10-CM | POA: Diagnosis not present

## 2022-01-26 DIAGNOSIS — E782 Mixed hyperlipidemia: Secondary | ICD-10-CM

## 2022-01-26 DIAGNOSIS — Z09 Encounter for follow-up examination after completed treatment for conditions other than malignant neoplasm: Secondary | ICD-10-CM

## 2022-01-26 DIAGNOSIS — F988 Other specified behavioral and emotional disorders with onset usually occurring in childhood and adolescence: Secondary | ICD-10-CM

## 2022-01-26 DIAGNOSIS — G4719 Other hypersomnia: Secondary | ICD-10-CM

## 2022-01-26 LAB — AEROBIC/ANAEROBIC CULTURE W GRAM STAIN (SURGICAL/DEEP WOUND)
Culture: NO GROWTH
Gram Stain: NONE SEEN

## 2022-01-26 LAB — CULTURE, BLOOD (ROUTINE X 2)
Culture: NO GROWTH
Culture: NO GROWTH

## 2022-01-26 LAB — SURGICAL PATHOLOGY

## 2022-01-26 MED ORDER — AMLODIPINE BESYLATE 5 MG PO TABS: 5.0000 mg | ORAL_TABLET | Freq: Every day | ORAL | 1 refills | Status: DC

## 2022-01-26 MED ORDER — LISDEXAMFETAMINE DIMESYLATE 40 MG PO CAPS: 40.0000 mg | ORAL_CAPSULE | ORAL | 0 refills | Status: DC

## 2022-01-26 MED ORDER — ATORVASTATIN CALCIUM 40 MG PO TABS: ORAL_TABLET | ORAL | 1 refills | Status: DC
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
## 2022-02-02 ENCOUNTER — Other Ambulatory Visit: Payer: Self-pay | Admitting: Physician Assistant

## 2022-02-03 ENCOUNTER — Telehealth: Payer: Managed Care, Other (non HMO) | Admitting: Infectious Diseases

## 2022-02-16 ENCOUNTER — Ambulatory Visit (INDEPENDENT_AMBULATORY_CARE_PROVIDER_SITE_OTHER): Payer: Managed Care, Other (non HMO)

## 2022-02-16 ENCOUNTER — Ambulatory Visit (INDEPENDENT_AMBULATORY_CARE_PROVIDER_SITE_OTHER): Payer: Managed Care, Other (non HMO) | Admitting: Nurse Practitioner

## 2022-02-16 ENCOUNTER — Other Ambulatory Visit (INDEPENDENT_AMBULATORY_CARE_PROVIDER_SITE_OTHER): Payer: Self-pay | Admitting: Nurse Practitioner

## 2022-02-16 ENCOUNTER — Encounter (INDEPENDENT_AMBULATORY_CARE_PROVIDER_SITE_OTHER): Payer: Self-pay

## 2022-02-16 ENCOUNTER — Encounter (INDEPENDENT_AMBULATORY_CARE_PROVIDER_SITE_OTHER): Payer: Self-pay | Admitting: Nurse Practitioner

## 2022-02-16 VITALS — BP 155/92 | HR 78 | Resp 18 | Ht 73.0 in | Wt 264.6 lb

## 2022-02-16 DIAGNOSIS — Z89432 Acquired absence of left foot: Secondary | ICD-10-CM

## 2022-02-16 DIAGNOSIS — E785 Hyperlipidemia, unspecified: Secondary | ICD-10-CM

## 2022-02-16 DIAGNOSIS — I739 Peripheral vascular disease, unspecified: Secondary | ICD-10-CM | POA: Diagnosis not present

## 2022-02-16 DIAGNOSIS — L97529 Non-pressure chronic ulcer of other part of left foot with unspecified severity: Secondary | ICD-10-CM
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
## 2022-03-02 ENCOUNTER — Ambulatory Visit (INDEPENDENT_AMBULATORY_CARE_PROVIDER_SITE_OTHER): Payer: Managed Care, Other (non HMO) | Admitting: Nurse Practitioner

## 2022-03-07 ENCOUNTER — Other Ambulatory Visit: Payer: Self-pay | Admitting: Internal Medicine

## 2022-03-07 ENCOUNTER — Other Ambulatory Visit: Payer: Self-pay | Admitting: Physician Assistant

## 2022-03-07 DIAGNOSIS — F5101 Primary insomnia: Secondary | ICD-10-CM

## 2022-03-09 NOTE — Telephone Encounter (Signed)
Last 01/26/22 and next 1/24

## 2022-03-11 ENCOUNTER — Telehealth: Payer: Self-pay

## 2022-03-11 ENCOUNTER — Other Ambulatory Visit: Payer: Self-pay | Admitting: Physician Assistant

## 2022-03-11 DIAGNOSIS — G4719 Other hypersomnia: Secondary | ICD-10-CM

## 2022-03-16 ENCOUNTER — Ambulatory Visit (INDEPENDENT_AMBULATORY_CARE_PROVIDER_SITE_OTHER): Payer: Managed Care, Other (non HMO) | Admitting: Nurse Practitioner

## 2022-03-16 NOTE — Telephone Encounter (Signed)
done

## 2022-03-24 ENCOUNTER — Encounter (INDEPENDENT_AMBULATORY_CARE_PROVIDER_SITE_OTHER): Payer: Self-pay | Admitting: Nurse Practitioner

## 2022-03-24 ENCOUNTER — Other Ambulatory Visit: Payer: Self-pay | Admitting: Internal Medicine

## 2022-03-24 ENCOUNTER — Ambulatory Visit (INDEPENDENT_AMBULATORY_CARE_PROVIDER_SITE_OTHER): Payer: Medicaid Other | Admitting: Nurse Practitioner

## 2022-03-24 VITALS — BP 161/92 | HR 79 | Resp 17 | Ht 73.0 in | Wt 273.0 lb

## 2022-03-24 DIAGNOSIS — F5101 Primary insomnia: Secondary | ICD-10-CM

## 2022-03-24 DIAGNOSIS — E785 Hyperlipidemia, unspecified: Secondary | ICD-10-CM | POA: Diagnosis not present

## 2022-03-24 DIAGNOSIS — I10 Essential (primary) hypertension: Secondary | ICD-10-CM | POA: Diagnosis not present

## 2022-03-24 DIAGNOSIS — Z89432 Acquired absence of left foot: Secondary | ICD-10-CM

## 2022-03-24 MED ORDER — ZOLPIDEM TARTRATE 10 MG PO TABS: 10.0000 mg | ORAL_TABLET | Freq: Every evening | ORAL | 0 refills | Status: DC | PRN

## 2022-03-24 NOTE — Telephone Encounter (Signed)
Next 1/24

## 2022-03-27 ENCOUNTER — Telehealth (INDEPENDENT_AMBULATORY_CARE_PROVIDER_SITE_OTHER): Payer: Self-pay

## 2022-03-27 NOTE — Telephone Encounter (Signed)
I spoke with the patient regarding getting him scheduled for a left BKA with Dr. Delana Meyer. I advised the patient that I have put in for a prior authorization and am waiting to get a decision. Patient stated he understood.

## 2022-04-03 ENCOUNTER — Encounter (INDEPENDENT_AMBULATORY_CARE_PROVIDER_SITE_OTHER): Payer: Self-pay | Admitting: Nurse Practitioner
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
## 2022-04-06 ENCOUNTER — Telehealth (INDEPENDENT_AMBULATORY_CARE_PROVIDER_SITE_OTHER): Payer: Self-pay

## 2022-04-06 NOTE — Telephone Encounter (Signed)
Spoke with the patient and he is scheduled with Dr. Lucky Cowboy on 04/09/22 at the [facility] for a left BKA. Pre-op phone call is on 04/07/22 between 8-1 pm. Pre-surgical instructions were discussed and patient stated he understood. All dependent on the prior auth being approved.

## 2022-04-07 ENCOUNTER — Encounter
Admission: RE | Admit: 2022-04-07 | Discharge: 2022-04-07 | Disposition: A | Discharge status: Home / Self Care | Payer: Medicaid Other | Source: Ambulatory Visit | Attending: Vascular Surgery | Admitting: Vascular Surgery

## 2022-04-07 ENCOUNTER — Encounter: Payer: Self-pay | Admitting: Vascular Surgery

## 2022-04-07 ENCOUNTER — Other Ambulatory Visit (INDEPENDENT_AMBULATORY_CARE_PROVIDER_SITE_OTHER): Payer: Self-pay | Admitting: Nurse Practitioner

## 2022-04-07 DIAGNOSIS — M86172 Other acute osteomyelitis, left ankle and foot: Secondary | ICD-10-CM

## 2022-04-07 NOTE — Patient Instructions (Addendum)
Your procedure is scheduled on: Thursday April 09, 2022. Report to Day Surgery inside [facility] 2nd floor, stop by registration desk before getting on elevator.  To find out your arrival time please call [phone] between 1PM - 3PM on Wednesday April 08, 2022.  Remember: Instructions that are not followed completely may result in serious medical risk,  up to and including death, or upon the discretion of your surgeon and anesthesiologist your  surgery may need to be rescheduled.     _X__ 1. Do not eat food or drink fluids after midnight the night before your procedure.                 No chewing gum or hard candies.   __X__2.  On the morning of surgery brush your teeth with toothpaste and water, you                may rinse your mouth with mouthwash if you wish.  Do not swallow any toothpaste or mouthwash.     _X__ 3.  No Alcohol for 24 hours before or after surgery.   _X__ 4.  Do Not Smoke or use e-cigarettes For 24 Hours Prior to Your Surgery.                 Do not use any chewable tobacco products for at least 6 hours prior to                 Surgery.  _X__  5.  Do not use any recreational drugs (marijuana, cocaine, heroin, ecstasy, MDMA or other)                For at least one week prior to your surgery.  Combination of these drugs with anesthesia                May have life threatening results.  ____  6.  Bring all medications with you on the day of surgery if instructed.   __X_ 7.  Notify your doctor if there is any change in your medical condition      (cold, fever, infections).     Do not wear jewelry, make-up, hairpins, clips or nail polish. Do not wear lotions, powders, or perfumes. You may wear deodorant. Do not shave 48 hours prior to surgery. Men may shave face and neck. Do not bring valuables to the hospital.    [facility] is not responsible for any belongings or valuables.  Contacts, dentures or bridgework may not be worn  into surgery. Leave your suitcase in the car. After surgery it may be brought to your room. For patients admitted to the hospital, discharge time is determined by your treatment team.   Patients discharged the day of surgery will not be allowed to drive home.   Make arrangements for someone to be with you for the first 24 hours of your Same Day Discharge.   _X___ Take these medicines the morning of surgery with A SIP OF WATER:    1. amLODipine (NORVASC) 5 MG   2. buPROPion (WELLBUTRIN XL) 150 MG   3. carvedilol (COREG) 3.125 MG t   4. DULoxetine (CYMBALTA) 60 MG   5. levothyroxine (SYNTHROID) 75 MCG   6. lisdexamfetamine (VYVANSE) 40 MG   7. Oxycodone HCl 10 MG (if needed)  ____ Fleet Enema (as directed)   __X__ Use CHG Soap (or wipes) as directed  ____ Use Benzoyl Peroxide Gel as instructed  __X__ Use inhalers on the  day of surgery  albuterol (PROVENTIL HFA;VENTOLIN HFA) 108 (90 Base) MCG/ACT inhaler   ____ Stop metformin 2 days prior to surgery    ____ Take 1/2 of usual insulin dose the night before surgery. No insulin the morning          of surgery.   ____ Call your PCP, cardiologist, or Pulmonologist if taking Coumadin/Plavix/aspirin and ask when to stop before your surgery.   __X__ One Week prior to surgery- Stop Anti-inflammatories such as Ibuprofen, Aleve, Advil, Motrin, meloxicam (MOBIC), diclofenac, etodolac, ketorolac, Toradol, Daypro, piroxicam, Goody's or BC powders. OK TO USE TYLENOL IF NEEDED   __X__ Stop supplements until after surgery. ascorbic acid (VITAMIN C)     __X__ Bring Pain and Spinal Cord Stimulator control to the hospital.    If you have any questions regarding your pre-procedure instructions,  Please call Pre-admit Testing at [phone]  Preparing the Skin Before Surgery     To help prevent the risk of infection at your surgical site, we are now providing you with rinse-free Sage 2% Chlorhexidine Gluconate (CHG) disposable  wipes.  Chlorhexidine Gluconate (CHG) Soap  o An antiseptic cleaner that kills germs and bonds with the skin to continue killing germs even after washing  o Used for showering the night before surgery and morning of surgery  The night before surgery: Shower or bathe with warm water. Do not apply perfume, lotions, powders. Wait one hour after shower. Skin should be dry and cool. Open Sage wipe package - 6 disposable cloths are inside. Wipe body using one cloth for the right arm, one cloth for the left arm, one cloth for the right leg, one cloth for the left leg, one cloth for the chest/abdomen area (do not use on breasts if breast feeding), and one cloth for the back. 5. Do not rinse, allow to dry. 6. Skin may fee "tacky" for several minutes. 7. Dress in clean clothes. 8. Place clean sheets on your bed and do not sleep with pets.  REPEAT ABOVE ON THE MORNING OF SURGERY PRIOR TO ARRIVING TO [place].
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
## 2022-04-08 ENCOUNTER — Encounter
Admission: RE | Admit: 2022-04-08 | Discharge: 2022-04-08 | Disposition: A | Discharge status: Home / Self Care | Payer: 59 | Source: Ambulatory Visit | Attending: Vascular Surgery | Admitting: Vascular Surgery

## 2022-04-08 DIAGNOSIS — Z01818 Encounter for other preprocedural examination: Secondary | ICD-10-CM | POA: Insufficient documentation

## 2022-04-08 DIAGNOSIS — M86172 Other acute osteomyelitis, left ankle and foot: Secondary | ICD-10-CM | POA: Insufficient documentation

## 2022-04-08 LAB — CBC WITH DIFFERENTIAL/PLATELET
Abs Immature Granulocytes: 0.03 10*3/uL (ref 0.00–0.07)
Basophils Absolute: 0 10*3/uL (ref 0.0–0.1)
Basophils Relative: 1 %
Eosinophils Absolute: 0.1 10*3/uL (ref 0.0–0.5)
Eosinophils Relative: 3 %
HCT: 35 % — ABNORMAL LOW (ref 39.0–52.0)
Hemoglobin: 10.7 g/dL — ABNORMAL LOW (ref 13.0–17.0)
Immature Granulocytes: 1 %
Lymphocytes Relative: 20 %
Lymphs Abs: 1 10*3/uL (ref 0.7–4.0)
MCH: 24.8 pg — ABNORMAL LOW (ref 26.0–34.0)
MCHC: 30.6 g/dL (ref 30.0–36.0)
MCV: 81 fL (ref 80.0–100.0)
Monocytes Absolute: 0.5 10*3/uL (ref 0.1–1.0)
Monocytes Relative: 11 %
Neutro Abs: 3.2 10*3/uL (ref 1.7–7.7)
Neutrophils Relative %: 64 %
Platelets: 249 10*3/uL (ref 150–400)
RBC: 4.32 MIL/uL (ref 4.22–5.81)
RDW: 14.9 % (ref 11.5–15.5)
WBC: 4.9 10*3/uL (ref 4.0–10.5)
nRBC: 0 % (ref 0.0–0.2)

## 2022-04-08 LAB — BASIC METABOLIC PANEL
Anion gap: 9 (ref 5–15)
BUN: 19 mg/dL (ref 8–23)
CO2: 24 mmol/L (ref 22–32)
Calcium: 8.8 mg/dL — ABNORMAL LOW (ref 8.9–10.3)
Chloride: 104 mmol/L (ref 98–111)
Creatinine, Ser: 0.89 mg/dL (ref 0.61–1.24)
GFR, Estimated: >60 mL/min (ref >60)
Glucose, Bld: 100 mg/dL — ABNORMAL HIGH (ref 70–99)
Potassium: 3.8 mmol/L (ref 3.5–5.1)
Sodium: 137 mmol/L (ref 135–145)

## 2022-04-09 ENCOUNTER — Inpatient Hospital Stay: Payer: 59 | Admitting: Urgent Care

## 2022-04-09 ENCOUNTER — Other Ambulatory Visit: Payer: Self-pay

## 2022-04-09 ENCOUNTER — Encounter: Payer: Self-pay | Admitting: Vascular Surgery

## 2022-04-09 ENCOUNTER — Inpatient Hospital Stay: Payer: 59

## 2022-04-09 ENCOUNTER — Inpatient Hospital Stay
Admission: RE | Admit: 2022-04-09 | Discharge: 2022-04-18 | DRG: 617 | Disposition: A | Discharge status: Home / Self Care | Payer: 59 | Attending: Vascular Surgery | Admitting: Vascular Surgery

## 2022-04-09 ENCOUNTER — Encounter
Admission: RE | Disposition: A | Discharge status: Home / Self Care | Payer: Self-pay | Source: Ambulatory Visit | Attending: Vascular Surgery

## 2022-04-09 DIAGNOSIS — R269 Unspecified abnormalities of gait and mobility: Secondary | ICD-10-CM | POA: Diagnosis present

## 2022-04-09 DIAGNOSIS — T8789 Other complications of amputation stump: Secondary | ICD-10-CM | POA: Diagnosis not present

## 2022-04-09 DIAGNOSIS — Y835 Amputation of limb(s) as the cause of abnormal reaction of the patient, or of later complication, without mention of misadventure at the time of the procedure: Secondary | ICD-10-CM | POA: Diagnosis present

## 2022-04-09 DIAGNOSIS — Z86010 Personal history of colonic polyps: Secondary | ICD-10-CM

## 2022-04-09 DIAGNOSIS — T8781 Dehiscence of amputation stump: Secondary | ICD-10-CM | POA: Diagnosis present

## 2022-04-09 DIAGNOSIS — I2510 Atherosclerotic heart disease of native coronary artery without angina pectoris: Secondary | ICD-10-CM | POA: Diagnosis present

## 2022-04-09 DIAGNOSIS — I10 Essential (primary) hypertension: Secondary | ICD-10-CM | POA: Diagnosis present

## 2022-04-09 DIAGNOSIS — Z9889 Other specified postprocedural states: Secondary | ICD-10-CM

## 2022-04-09 DIAGNOSIS — I252 Old myocardial infarction: Secondary | ICD-10-CM

## 2022-04-09 DIAGNOSIS — Z8616 Personal history of COVID-19: Secondary | ICD-10-CM

## 2022-04-09 DIAGNOSIS — D649 Anemia, unspecified: Secondary | ICD-10-CM | POA: Diagnosis present

## 2022-04-09 DIAGNOSIS — Z8711 Personal history of peptic ulcer disease: Secondary | ICD-10-CM

## 2022-04-09 DIAGNOSIS — E785 Hyperlipidemia, unspecified: Secondary | ICD-10-CM | POA: Diagnosis present

## 2022-04-09 DIAGNOSIS — J45909 Unspecified asthma, uncomplicated: Secondary | ICD-10-CM | POA: Diagnosis present

## 2022-04-09 DIAGNOSIS — M869 Osteomyelitis, unspecified: Secondary | ICD-10-CM | POA: Diagnosis present

## 2022-04-09 DIAGNOSIS — E039 Hypothyroidism, unspecified: Secondary | ICD-10-CM | POA: Diagnosis present

## 2022-04-09 DIAGNOSIS — E1151 Type 2 diabetes mellitus with diabetic peripheral angiopathy without gangrene: Secondary | ICD-10-CM | POA: Diagnosis present

## 2022-04-09 DIAGNOSIS — Z833 Family history of diabetes mellitus: Secondary | ICD-10-CM

## 2022-04-09 DIAGNOSIS — G4733 Obstructive sleep apnea (adult) (pediatric): Secondary | ICD-10-CM | POA: Diagnosis present

## 2022-04-09 DIAGNOSIS — Z79890 Hormone replacement therapy (postmenopausal): Secondary | ICD-10-CM

## 2022-04-09 DIAGNOSIS — Z8701 Personal history of pneumonia (recurrent): Secondary | ICD-10-CM

## 2022-04-09 DIAGNOSIS — E669 Obesity, unspecified: Secondary | ICD-10-CM | POA: Diagnosis present

## 2022-04-09 DIAGNOSIS — W010XXA Fall on same level from slipping, tripping and stumbling without subsequent striking against object, initial encounter: Secondary | ICD-10-CM | POA: Diagnosis not present

## 2022-04-09 DIAGNOSIS — E1169 Type 2 diabetes mellitus with other specified complication: Principal | ICD-10-CM | POA: Diagnosis present

## 2022-04-09 DIAGNOSIS — Z9682 Presence of neurostimulator: Secondary | ICD-10-CM

## 2022-04-09 DIAGNOSIS — K219 Gastro-esophageal reflux disease without esophagitis: Secondary | ICD-10-CM | POA: Diagnosis present

## 2022-04-09 DIAGNOSIS — Z79899 Other long term (current) drug therapy: Secondary | ICD-10-CM | POA: Diagnosis not present

## 2022-04-09 DIAGNOSIS — Z955 Presence of coronary angioplasty implant and graft: Secondary | ICD-10-CM

## 2022-04-09 DIAGNOSIS — M1990 Unspecified osteoarthritis, unspecified site: Secondary | ICD-10-CM | POA: Diagnosis present

## 2022-04-09 DIAGNOSIS — Z6836 Body mass index (BMI) 36.0-36.9, adult: Secondary | ICD-10-CM

## 2022-04-09 DIAGNOSIS — Z87442 Personal history of urinary calculi: Secondary | ICD-10-CM

## 2022-04-09 DIAGNOSIS — M86172 Other acute osteomyelitis, left ankle and foot: Secondary | ICD-10-CM | POA: Diagnosis present

## 2022-04-09 DIAGNOSIS — Z89432 Acquired absence of left foot: Secondary | ICD-10-CM | POA: Diagnosis not present

## 2022-04-09 DIAGNOSIS — E1142 Type 2 diabetes mellitus with diabetic polyneuropathy: Secondary | ICD-10-CM | POA: Diagnosis present

## 2022-04-09 DIAGNOSIS — Y92230 Patient room in hospital as the place of occurrence of the external cause: Secondary | ICD-10-CM | POA: Diagnosis not present

## 2022-04-09 DIAGNOSIS — Z9884 Bariatric surgery status: Secondary | ICD-10-CM | POA: Diagnosis not present

## 2022-04-09 DIAGNOSIS — Z888 Allergy status to other drugs, medicaments and biological substances status: Secondary | ICD-10-CM

## 2022-04-09 HISTORY — DX: Peripheral vascular disease, unspecified: I73.9

## 2022-04-09 HISTORY — DX: Benign prostatic hyperplasia without lower urinary tract symptoms: N40.0

## 2022-04-09 HISTORY — DX: Cocaine abuse, in remission: F14.11

## 2022-04-09 HISTORY — DX: Other specified behavioral and emotional disorders with onset usually occurring in childhood and adolescence: F98.8

## 2022-04-09 HISTORY — DX: Bariatric surgery status: Z98.84

## 2022-04-09 HISTORY — DX: Osteomyelitis, unspecified: M86.9

## 2022-04-09 HISTORY — DX: Opioid use, unspecified, uncomplicated: F11.90

## 2022-04-09 HISTORY — PX: AMPUTATION: SHX166

## 2022-04-09 HISTORY — DX: Type 2 diabetes mellitus with diabetic polyneuropathy: E11.42

## 2022-04-09 HISTORY — DX: Male erectile dysfunction, unspecified: N52.9

## 2022-04-09 HISTORY — DX: Testicular hypofunction: E29.1

## 2022-04-09 LAB — CBC
HCT: 27.9 % — ABNORMAL LOW (ref 39.0–52.0)
Hemoglobin: 8.7 g/dL — ABNORMAL LOW (ref 13.0–17.0)
MCH: 25.1 pg — ABNORMAL LOW (ref 26.0–34.0)
MCHC: 31.2 g/dL (ref 30.0–36.0)
MCV: 80.4 fL (ref 80.0–100.0)
Platelets: 223 10*3/uL (ref 150–400)
RBC: 3.47 MIL/uL — ABNORMAL LOW (ref 4.22–5.81)
RDW: 15.1 % (ref 11.5–15.5)
WBC: 3.7 10*3/uL — ABNORMAL LOW (ref 4.0–10.5)
nRBC: 0 % (ref 0.0–0.2)

## 2022-04-09 LAB — GLUCOSE, CAPILLARY: Glucose-Capillary: 116 mg/dL — ABNORMAL HIGH (ref 70–99)

## 2022-04-09 LAB — CREATININE, SERUM
Creatinine, Ser: 1.13 mg/dL (ref 0.61–1.24)
GFR, Estimated: >60 mL/min (ref >60)

## 2022-04-09 SURGERY — AMPUTATION BELOW KNEE
Anesthesia: General | Site: Knee | Laterality: Left

## 2022-04-09 MED ORDER — POTASSIUM CHLORIDE CRYS ER 20 MEQ PO TBCR: 20.0000 meq | EXTENDED_RELEASE_TABLET | Freq: Every day | ORAL | Status: DC | PRN

## 2022-04-09 MED ORDER — GUAIFENESIN-DM 100-10 MG/5ML PO SYRP: 15.0000 mL | ORAL_SOLUTION | ORAL | Status: DC | PRN

## 2022-04-09 MED ORDER — BUPIVACAINE HCL (PF) 0.5 % IJ SOLN
INTRAMUSCULAR | Status: DC | PRN
  Administered 2022-04-09 (×2): 10 mL

## 2022-04-09 MED ORDER — VITAMIN C 500 MG PO TABS
500.0000 mg | ORAL_TABLET | Freq: Every day | ORAL | Status: DC
  Administered 2022-04-10 – 2022-04-18 (×9): 500 mg via ORAL
  Filled 2022-04-09 (×10): qty 1

## 2022-04-09 MED ORDER — LIDOCAINE HCL (PF) 2 % IJ SOLN
INTRAMUSCULAR | Status: AC
  Filled 2022-04-09: qty 5

## 2022-04-09 MED ORDER — CEFAZOLIN SODIUM-DEXTROSE 2-4 GM/100ML-% IV SOLN
2.0000 g | INTRAVENOUS | Status: AC
  Administered 2022-04-09: 2 g via INTRAVENOUS

## 2022-04-09 MED ORDER — FAMOTIDINE 20 MG PO TABS
ORAL_TABLET | ORAL | Status: AC
  Filled 2022-04-09: qty 1

## 2022-04-09 MED ORDER — POLYETHYLENE GLYCOL 3350 17 G PO PACK: 17.0000 g | PACK | Freq: Every day | ORAL | Status: DC | PRN

## 2022-04-09 MED ORDER — OXYCODONE HCL 5 MG PO TABS
10.0000 mg | ORAL_TABLET | ORAL | Status: DC | PRN
  Administered 2022-04-10 (×2): 10 mg via ORAL
  Filled 2022-04-09 (×2): qty 2

## 2022-04-09 MED ORDER — ENOXAPARIN SODIUM 40 MG/0.4ML IJ SOSY
40.0000 mg | PREFILLED_SYRINGE | INTRAMUSCULAR | Status: DC
  Administered 2022-04-10 – 2022-04-15 (×6): 40 mg via SUBCUTANEOUS
  Filled 2022-04-09 (×6): qty 0.4

## 2022-04-09 MED ORDER — OXYCODONE HCL 5 MG PO TABS
ORAL_TABLET | ORAL | Status: AC
  Administered 2022-04-09: 10 mg via ORAL
  Filled 2022-04-09: qty 1

## 2022-04-09 MED ORDER — SORBITOL 70 % SOLN: 30.0000 mL | Freq: Every day | Status: DC | PRN

## 2022-04-09 MED ORDER — HYDROMORPHONE HCL 1 MG/ML IJ SOLN
INTRAMUSCULAR | Status: AC
  Administered 2022-04-09: 0.5 mg via INTRAVENOUS
  Filled 2022-04-09: qty 1

## 2022-04-09 MED ORDER — BUPIVACAINE LIPOSOME 1.3 % IJ SUSP
INTRAMUSCULAR | Status: DC | PRN
  Administered 2022-04-09 (×2): 10 mL via PERINEURAL

## 2022-04-09 MED ORDER — PHENYLEPHRINE HCL (PRESSORS) 10 MG/ML IV SOLN
INTRAVENOUS | Status: AC
  Filled 2022-04-09: qty 1

## 2022-04-09 MED ORDER — LACTATED RINGERS IV SOLN: INTRAVENOUS | Status: DC

## 2022-04-09 MED ORDER — PROPOFOL 500 MG/50ML IV EMUL
INTRAVENOUS | Status: DC | PRN
  Administered 2022-04-09: 140 ug/kg/min via INTRAVENOUS
  Administered 2022-04-09: 80 mg via INTRAVENOUS

## 2022-04-09 MED ORDER — OXYCODONE HCL 5 MG PO TABS
5.0000 mg | ORAL_TABLET | ORAL | Status: DC | PRN
  Administered 2022-04-09 – 2022-04-11 (×3): 10 mg via ORAL
  Administered 2022-04-11: 5 mg via ORAL
  Administered 2022-04-11 – 2022-04-12 (×4): 10 mg via ORAL
  Filled 2022-04-09 (×3): qty 2
  Filled 2022-04-09: qty 1
  Filled 2022-04-09 (×4): qty 2

## 2022-04-09 MED ORDER — ACETAMINOPHEN 325 MG PO TABS: 325.0000 mg | ORAL_TABLET | Freq: Four times a day (QID) | ORAL | Status: DC | PRN

## 2022-04-09 MED ORDER — ACETAMINOPHEN 10 MG/ML IV SOLN: 1000.0000 mg | Freq: Once | INTRAVENOUS | Status: DC | PRN

## 2022-04-09 MED ORDER — ALBUMIN HUMAN 5 % IV SOLN: INTRAVENOUS | Status: DC | PRN

## 2022-04-09 MED ORDER — DOCUSATE SODIUM 100 MG PO CAPS
100.0000 mg | ORAL_CAPSULE | Freq: Every day | ORAL | Status: DC
  Administered 2022-04-10 – 2022-04-18 (×7): 100 mg via ORAL
  Filled 2022-04-09 (×9): qty 1

## 2022-04-09 MED ORDER — ALUM & MAG HYDROXIDE-SIMETH 200-200-20 MG/5ML PO SUSP: 15.0000 mL | ORAL | Status: DC | PRN

## 2022-04-09 MED ORDER — DEXAMETHASONE SODIUM PHOSPHATE 10 MG/ML IJ SOLN
INTRAMUSCULAR | Status: AC
  Filled 2022-04-09: qty 1

## 2022-04-09 MED ORDER — PROMETHAZINE HCL 25 MG/ML IJ SOLN: 6.2500 mg | INTRAMUSCULAR | Status: DC | PRN

## 2022-04-09 MED ORDER — MIDAZOLAM HCL 2 MG/2ML IJ SOLN
INTRAMUSCULAR | Status: AC
  Filled 2022-04-09: qty 2

## 2022-04-09 MED ORDER — LISDEXAMFETAMINE DIMESYLATE 20 MG PO CAPS
40.0000 mg | ORAL_CAPSULE | Freq: Every day | ORAL | Status: DC
  Administered 2022-04-09 – 2022-04-18 (×10): 40 mg via ORAL
  Filled 2022-04-09 (×10): qty 2

## 2022-04-09 MED ORDER — PROPOFOL 1000 MG/100ML IV EMUL
INTRAVENOUS | Status: AC
  Filled 2022-04-09: qty 100

## 2022-04-09 MED ORDER — ONDANSETRON HCL 4 MG/2ML IJ SOLN
INTRAMUSCULAR | Status: DC | PRN
  Administered 2022-04-09: 4 mg via INTRAVENOUS

## 2022-04-09 MED ORDER — EPHEDRINE 5 MG/ML INJ
INTRAVENOUS | Status: AC
  Filled 2022-04-09: qty 5

## 2022-04-09 MED ORDER — ZOLPIDEM TARTRATE 5 MG PO TABS
10.0000 mg | ORAL_TABLET | Freq: Every evening | ORAL | Status: DC | PRN
  Administered 2022-04-09 – 2022-04-17 (×4): 10 mg via ORAL
  Filled 2022-04-09 (×4): qty 2

## 2022-04-09 MED ORDER — FAMOTIDINE 20 MG PO TABS
20.0000 mg | ORAL_TABLET | Freq: Once | ORAL | Status: AC
  Administered 2022-04-09: 20 mg via ORAL

## 2022-04-09 MED ORDER — ACETAMINOPHEN 10 MG/ML IV SOLN
INTRAVENOUS | Status: AC
  Filled 2022-04-09: qty 100

## 2022-04-09 MED ORDER — HYDROMORPHONE HCL 1 MG/ML IJ SOLN
0.2500 mg | INTRAMUSCULAR | Status: DC | PRN
  Administered 2022-04-09 (×2): 0.5 mg via INTRAVENOUS

## 2022-04-09 MED ORDER — OXYCODONE HCL 5 MG/5ML PO SOLN: 5.0000 mg | Freq: Once | ORAL | Status: AC | PRN

## 2022-04-09 MED ORDER — FAMOTIDINE IN NACL 20-0.9 MG/50ML-% IV SOLN
20.0000 mg | Freq: Two times a day (BID) | INTRAVENOUS | Status: DC
  Administered 2022-04-09 – 2022-04-18 (×18): 20 mg via INTRAVENOUS
  Filled 2022-04-09 (×19): qty 50

## 2022-04-09 MED ORDER — SODIUM CHLORIDE 0.9 % IV SOLN: INTRAVENOUS | Status: DC

## 2022-04-09 MED ORDER — MAGNESIUM SULFATE 2 GM/50ML IV SOLN: 2.0000 g | Freq: Every day | INTRAVENOUS | Status: DC | PRN

## 2022-04-09 MED ORDER — PHENOL 1.4 % MT LIQD: 1.0000 | OROMUCOSAL | Status: DC | PRN

## 2022-04-09 MED ORDER — PHENYLEPHRINE HCL (PRESSORS) 10 MG/ML IV SOLN
INTRAVENOUS | Status: DC | PRN
  Administered 2022-04-09: 160 ug via INTRAVENOUS
  Administered 2022-04-09: 80 ug via INTRAVENOUS
  Administered 2022-04-09: 160 ug via INTRAVENOUS
  Administered 2022-04-09 (×2): 80 ug via INTRAVENOUS
  Administered 2022-04-09: 160 ug via INTRAVENOUS

## 2022-04-09 MED ORDER — KETAMINE HCL 10 MG/ML IJ SOLN
INTRAMUSCULAR | Status: DC | PRN
  Administered 2022-04-09: 20 mg via INTRAVENOUS
  Administered 2022-04-09 (×3): 10 mg via INTRAVENOUS

## 2022-04-09 MED ORDER — HYDRALAZINE HCL 20 MG/ML IJ SOLN: 5.0000 mg | INTRAMUSCULAR | Status: DC | PRN

## 2022-04-09 MED ORDER — GLYCOPYRROLATE 0.2 MG/ML IJ SOLN
INTRAMUSCULAR | Status: DC | PRN
  Administered 2022-04-09: .1 mg via INTRAVENOUS

## 2022-04-09 MED ORDER — CHLORHEXIDINE GLUCONATE CLOTH 2 % EX PADS
6.0000 | MEDICATED_PAD | Freq: Once | CUTANEOUS | Status: AC
  Administered 2022-04-09: 6 via TOPICAL

## 2022-04-09 MED ORDER — GLYCOPYRROLATE 0.2 MG/ML IJ SOLN
INTRAMUSCULAR | Status: AC
  Filled 2022-04-09: qty 1

## 2022-04-09 MED ORDER — EPHEDRINE SULFATE (PRESSORS) 50 MG/ML IJ SOLN
INTRAMUSCULAR | Status: DC | PRN
  Administered 2022-04-09: 10 mg via INTRAVENOUS
  Administered 2022-04-09: 5 mg via INTRAVENOUS

## 2022-04-09 MED ORDER — ACETAMINOPHEN 10 MG/ML IV SOLN
INTRAVENOUS | Status: DC | PRN
  Administered 2022-04-09: 1000 mg via INTRAVENOUS

## 2022-04-09 MED ORDER — ALBUMIN HUMAN 5 % IV SOLN
INTRAVENOUS | Status: AC
  Filled 2022-04-09: qty 250

## 2022-04-09 MED ORDER — LEVOTHYROXINE SODIUM 50 MCG PO TABS
75.0000 ug | ORAL_TABLET | Freq: Every day | ORAL | Status: DC
  Administered 2022-04-10 – 2022-04-18 (×9): 75 ug via ORAL
  Filled 2022-04-09 (×9): qty 1

## 2022-04-09 MED ORDER — VITAMIN D 25 MCG (1000 UNIT) PO TABS
2000.0000 [IU] | ORAL_TABLET | Freq: Every day | ORAL | Status: DC
  Administered 2022-04-10 – 2022-04-18 (×9): 2000 [IU] via ORAL
  Filled 2022-04-09 (×10): qty 2

## 2022-04-09 MED ORDER — METAXALONE 800 MG PO TABS
1600.0000 mg | ORAL_TABLET | Freq: Three times a day (TID) | ORAL | Status: DC
  Administered 2022-04-09 – 2022-04-18 (×26): 1600 mg via ORAL
  Filled 2022-04-09 (×29): qty 2

## 2022-04-09 MED ORDER — ZINC SULFATE 220 (50 ZN) MG PO CAPS
220.0000 mg | ORAL_CAPSULE | Freq: Every day | ORAL | Status: DC
  Administered 2022-04-10 – 2022-04-18 (×9): 220 mg via ORAL
  Filled 2022-04-09 (×9): qty 1

## 2022-04-09 MED ORDER — FENTANYL CITRATE (PF) 100 MCG/2ML IJ SOLN
INTRAMUSCULAR | Status: AC
  Filled 2022-04-09: qty 2

## 2022-04-09 MED ORDER — PHENYLEPHRINE HCL-NACL 20-0.9 MG/250ML-% IV SOLN
INTRAVENOUS | Status: DC | PRN
  Administered 2022-04-09: 40 ug/min via INTRAVENOUS

## 2022-04-09 MED ORDER — PROPOFOL 10 MG/ML IV BOLUS
INTRAVENOUS | Status: AC
  Filled 2022-04-09: qty 20

## 2022-04-09 MED ORDER — CHLORHEXIDINE GLUCONATE 0.12 % MT SOLN
15.0000 mL | Freq: Once | OROMUCOSAL | Status: AC
  Administered 2022-04-09: 15 mL via OROMUCOSAL

## 2022-04-09 MED ORDER — FENTANYL CITRATE (PF) 100 MCG/2ML IJ SOLN
INTRAMUSCULAR | Status: AC
  Administered 2022-04-09: 50 ug via INTRAVENOUS
  Filled 2022-04-09: qty 2

## 2022-04-09 MED ORDER — OXYCODONE HCL 5 MG PO TABS
ORAL_TABLET | ORAL | Status: AC
  Filled 2022-04-09: qty 1

## 2022-04-09 MED ORDER — MIDAZOLAM HCL 5 MG/5ML IJ SOLN
INTRAMUSCULAR | Status: DC | PRN
  Administered 2022-04-09 (×2): 2 mg via INTRAVENOUS

## 2022-04-09 MED ORDER — KETAMINE HCL 50 MG/5ML IJ SOSY
PREFILLED_SYRINGE | INTRAMUSCULAR | Status: AC
  Filled 2022-04-09: qty 5

## 2022-04-09 MED ORDER — BUPIVACAINE HCL (PF) 0.5 % IJ SOLN
INTRAMUSCULAR | Status: AC
  Filled 2022-04-09: qty 20

## 2022-04-09 MED ORDER — OXYCODONE HCL 5 MG PO TABS: 10.0000 mg | ORAL_TABLET | Freq: Once | ORAL | Status: AC | PRN

## 2022-04-09 MED ORDER — ATORVASTATIN CALCIUM 20 MG PO TABS
40.0000 mg | ORAL_TABLET | Freq: Every day | ORAL | Status: DC
  Administered 2022-04-09 – 2022-04-17 (×9): 40 mg via ORAL
  Filled 2022-04-09 (×9): qty 2

## 2022-04-09 MED ORDER — ALBUTEROL SULFATE (2.5 MG/3ML) 0.083% IN NEBU: 2.5000 mg | INHALATION_SOLUTION | Freq: Four times a day (QID) | RESPIRATORY_TRACT | Status: DC | PRN

## 2022-04-09 MED ORDER — METOPROLOL TARTRATE 5 MG/5ML IV SOLN: 2.0000 mg | INTRAVENOUS | Status: DC | PRN

## 2022-04-09 MED ORDER — CEFAZOLIN SODIUM-DEXTROSE 2-4 GM/100ML-% IV SOLN
INTRAVENOUS | Status: AC
  Filled 2022-04-09: qty 100

## 2022-04-09 MED ORDER — 0.9 % SODIUM CHLORIDE (POUR BTL) OPTIME
TOPICAL | Status: DC | PRN
  Administered 2022-04-09: 1000 mL

## 2022-04-09 MED ORDER — LIDOCAINE HCL (CARDIAC) PF 100 MG/5ML IV SOSY
PREFILLED_SYRINGE | INTRAVENOUS | Status: DC | PRN
  Administered 2022-04-09: 100 mg via INTRAVENOUS

## 2022-04-09 MED ORDER — TRAMADOL HCL 50 MG PO TABS
50.0000 mg | ORAL_TABLET | Freq: Four times a day (QID) | ORAL | Status: DC
  Administered 2022-04-09 – 2022-04-12 (×12): 50 mg via ORAL
  Filled 2022-04-09 (×12): qty 1

## 2022-04-09 MED ORDER — DROPERIDOL 2.5 MG/ML IJ SOLN: 0.6250 mg | Freq: Once | INTRAMUSCULAR | Status: DC | PRN

## 2022-04-09 MED ORDER — ONDANSETRON HCL 4 MG/2ML IJ SOLN
INTRAMUSCULAR | Status: AC
  Filled 2022-04-09: qty 2

## 2022-04-09 MED ORDER — BUPROPION HCL ER (XL) 150 MG PO TB24
150.0000 mg | ORAL_TABLET | Freq: Every day | ORAL | Status: DC
  Administered 2022-04-10 – 2022-04-18 (×9): 150 mg via ORAL
  Filled 2022-04-09 (×9): qty 1

## 2022-04-09 MED ORDER — DEXMEDETOMIDINE HCL IN NACL 80 MCG/20ML IV SOLN
INTRAVENOUS | Status: DC | PRN
  Administered 2022-04-09 (×2): 8 ug via BUCCAL

## 2022-04-09 MED ORDER — DULOXETINE HCL 30 MG PO CPEP
60.0000 mg | ORAL_CAPSULE | Freq: Every day | ORAL | Status: DC
  Administered 2022-04-10 – 2022-04-18 (×9): 60 mg via ORAL
  Filled 2022-04-09 (×9): qty 2

## 2022-04-09 MED ORDER — FENTANYL CITRATE PF 50 MCG/ML IJ SOSY
PREFILLED_SYRINGE | INTRAMUSCULAR | Status: AC
  Filled 2022-04-09: qty 1

## 2022-04-09 MED ORDER — ONDANSETRON HCL 4 MG/2ML IJ SOLN: 4.0000 mg | Freq: Four times a day (QID) | INTRAMUSCULAR | Status: DC | PRN

## 2022-04-09 MED ORDER — DICLOFENAC SODIUM 25 MG PO TBEC
50.0000 mg | DELAYED_RELEASE_TABLET | Freq: Every day | ORAL | Status: DC
  Administered 2022-04-10 – 2022-04-18 (×8): 50 mg via ORAL
  Filled 2022-04-09 (×9): qty 2

## 2022-04-09 MED ORDER — FENTANYL CITRATE (PF) 100 MCG/2ML IJ SOLN
INTRAMUSCULAR | Status: AC
  Administered 2022-04-09: 25 ug via INTRAVENOUS
  Filled 2022-04-09: qty 2

## 2022-04-09 MED ORDER — AMLODIPINE BESYLATE 5 MG PO TABS
5.0000 mg | ORAL_TABLET | Freq: Every day | ORAL | Status: DC
  Administered 2022-04-09 – 2022-04-18 (×10): 5 mg via ORAL
  Filled 2022-04-09 (×10): qty 1

## 2022-04-09 MED ORDER — CHLORHEXIDINE GLUCONATE 0.12 % MT SOLN
OROMUCOSAL | Status: AC
  Filled 2022-04-09: qty 15

## 2022-04-09 MED ORDER — PHENYLEPHRINE 80 MCG/ML (10ML) SYRINGE FOR IV PUSH (FOR BLOOD PRESSURE SUPPORT)
PREFILLED_SYRINGE | INTRAVENOUS | Status: AC
  Filled 2022-04-09: qty 10

## 2022-04-09 MED ORDER — CARVEDILOL 3.125 MG PO TABS
3.1250 mg | ORAL_TABLET | Freq: Two times a day (BID) | ORAL | Status: DC
  Administered 2022-04-10 – 2022-04-18 (×16): 3.125 mg via ORAL
  Filled 2022-04-09 (×17): qty 1

## 2022-04-09 MED ORDER — FENTANYL CITRATE (PF) 100 MCG/2ML IJ SOLN
INTRAMUSCULAR | Status: DC | PRN
  Administered 2022-04-09 (×3): 50 ug via INTRAVENOUS

## 2022-04-09 MED ORDER — ORAL CARE MOUTH RINSE: 15.0000 mL | Freq: Once | OROMUCOSAL | Status: AC

## 2022-04-09 MED ORDER — BUPIVACAINE LIPOSOME 1.3 % IJ SUSP
INTRAMUSCULAR | Status: AC
  Filled 2022-04-09: qty 20

## 2022-04-09 MED ORDER — ALBUTEROL SULFATE HFA 108 (90 BASE) MCG/ACT IN AERS: 2.0000 | INHALATION_SPRAY | Freq: Four times a day (QID) | RESPIRATORY_TRACT | Status: DC | PRN

## 2022-04-09 MED ORDER — LABETALOL HCL 5 MG/ML IV SOLN: 10.0000 mg | INTRAVENOUS | Status: DC | PRN

## 2022-04-09 MED ORDER — HYDROMORPHONE HCL 1 MG/ML IJ SOLN
0.5000 mg | INTRAMUSCULAR | Status: DC | PRN
  Administered 2022-04-09 – 2022-04-12 (×10): 1 mg via INTRAVENOUS
  Filled 2022-04-09 (×10): qty 1

## 2022-04-09 MED ORDER — CHLORHEXIDINE GLUCONATE CLOTH 2 % EX PADS: 6.0000 | MEDICATED_PAD | Freq: Once | CUTANEOUS | Status: DC

## 2022-04-09 MED ORDER — FENTANYL CITRATE (PF) 100 MCG/2ML IJ SOLN
25.0000 ug | INTRAMUSCULAR | Status: AC | PRN
  Administered 2022-04-09 (×2): 25 ug via INTRAVENOUS
  Administered 2022-04-09: 50 ug via INTRAVENOUS
  Administered 2022-04-09: 25 ug via INTRAVENOUS

## 2022-04-09 MED ORDER — DEXAMETHASONE SODIUM PHOSPHATE 10 MG/ML IJ SOLN
INTRAMUSCULAR | Status: DC | PRN
  Administered 2022-04-09: 5 mg via INTRAVENOUS

## 2022-04-09 MED ORDER — BUPIVACAINE LIPOSOME 1.3 % IJ SUSP: INTRAMUSCULAR | Status: DC | PRN

## 2022-04-09 SURGICAL SUPPLY — 44 items
BLADE SAGITTAL WIDE XTHICK NO (BLADE) IMPLANT
BLADE SAW SAG 25.4X90 (BLADE) ×1 IMPLANT
BNDG COHESIVE 4X5 TAN STRL LF (GAUZE/BANDAGES/DRESSINGS) ×1 IMPLANT
BNDG ELASTIC 6X5.8 VLCR NS LF (GAUZE/BANDAGES/DRESSINGS) ×1 IMPLANT
BNDG GAUZE DERMACEA FLUFF 4 (GAUZE/BANDAGES/DRESSINGS) ×2 IMPLANT
BRUSH SCRUB EZ  4% CHG (MISCELLANEOUS) ×1
BRUSH SCRUB EZ 4% CHG (MISCELLANEOUS) ×1 IMPLANT
CHLORAPREP W/TINT 26 (MISCELLANEOUS) ×1 IMPLANT
DRAPE INCISE IOBAN 66X45 STRL (DRAPES) IMPLANT
ELECT CAUTERY BLADE 6.4 (BLADE) ×1 IMPLANT
ELECT REM PT RETURN 9FT ADLT (ELECTROSURGICAL) ×1
ELECTRODE REM PT RTRN 9FT ADLT (ELECTROSURGICAL) ×1 IMPLANT
GAUZE XEROFORM 1X8 LF (GAUZE/BANDAGES/DRESSINGS) ×2 IMPLANT
GLOVE BIO SURGEON STRL SZ7 (GLOVE) ×2 IMPLANT
GOWN STRL REUS W/ TWL LRG LVL3 (GOWN DISPOSABLE) ×2 IMPLANT
GOWN STRL REUS W/ TWL XL LVL3 (GOWN DISPOSABLE) ×1 IMPLANT
GOWN STRL REUS W/TWL LRG LVL3 (GOWN DISPOSABLE) ×2
GOWN STRL REUS W/TWL XL LVL3 (GOWN DISPOSABLE) ×1
HANDLE YANKAUER SUCT BULB TIP (MISCELLANEOUS) ×1 IMPLANT
KIT TURNOVER KIT A (KITS) ×1 IMPLANT
LABEL OR SOLS (LABEL) ×1 IMPLANT
MANIFOLD NEPTUNE II (INSTRUMENTS) ×1 IMPLANT
NS IRRIG 1000ML POUR BTL (IV SOLUTION) ×1 IMPLANT
PACK EXTREMITY ARMC (MISCELLANEOUS) ×1 IMPLANT
PAD ABD DERMACEA PRESS 5X9 (GAUZE/BANDAGES/DRESSINGS) ×2 IMPLANT
PAD PREP 24X41 OB/GYN DISP (PERSONAL CARE ITEMS) ×1 IMPLANT
PROBE BIOSP ALOKA ALPHA6 PROST (MISCELLANEOUS) IMPLANT
SPONGE T-LAP 18X18 ~~LOC~~+RFID (SPONGE) ×1 IMPLANT
STAPLER SKIN PROX 35W (STAPLE) ×1 IMPLANT
STOCKINETTE M/LG 89821 (MISCELLANEOUS) ×1 IMPLANT
SUT ETHILON 3-0 FS-10 30 BLK (SUTURE) ×2
SUT ETHILON 4 0 PS 2 18 (SUTURE) IMPLANT
SUT ETHILON 4-0 (SUTURE) ×1
SUT ETHILON 4-0 FS2 18XMFL BLK (SUTURE) ×1
SUT SILK 2 0 (SUTURE) ×1
SUT SILK 2 0 SH (SUTURE) ×2 IMPLANT
SUT SILK 2-0 18XBRD TIE 12 (SUTURE) ×1 IMPLANT
SUT SILK 3 0 (SUTURE) ×1
SUT SILK 3-0 18XBRD TIE 12 (SUTURE) ×1 IMPLANT
SUT VIC AB 0 CT1 36 (SUTURE) ×2 IMPLANT
SUT VIC AB 2-0 CT1 (SUTURE) ×2 IMPLANT
SUTURE EHLN 3-0 FS-10 30 BLK (SUTURE) IMPLANT
SUTURE ETHLN 4-0 FS2 18XMF BLK (SUTURE) IMPLANT
TRAP FLUID SMOKE EVACUATOR (MISCELLANEOUS) ×1 IMPLANT

## 2022-04-09 NOTE — Anesthesia Procedure Notes (Signed)
Anesthesia Regional Block: Adductor canal block   Pre-Anesthetic Checklist: , timeout performed,  Correct Patient, Correct Site, Correct Laterality,  Correct Procedure, Correct Position, site marked,  Risks and benefits discussed,  Surgical consent,  Pre-op evaluation,  At surgeon's request and post-op pain management  Laterality: Left and Lower  Prep: chloraprep       Needles:  Injection technique: Single-shot  Needle Type: Stimiplex     Needle Length: 9cm  Needle Gauge: 22     Additional Needles:   Procedures:,,,, ultrasound used (permanent image in chart),,    Narrative:  Start time: 04/09/2022 11:30 AM End time: 04/09/2022 11:33 AM Injection made incrementally with aspirations every 5 mL.  Performed by: Personally  Anesthesiologist: Iran Ouch, MD  Additional Notes: Patient consented for risk and benefits of nerve block including but not limited to nerve damage, failed block, bleeding and infection.  Patient voiced understanding.  Functioning IV was confirmed and monitors were applied.  Timeout done prior to procedure and prior to any sedation being given to the patient.  Patient confirmed procedure site prior to any sedation given to the patient. Sterile prep,hand hygiene and sterile gloves were used.  Minimal sedation used for procedure.  No paresthesia endorsed by patient during the procedure.  Negative aspiration and negative test dose prior to incremental administration of local anesthetic. The patient tolerated the procedure well with no immediate complications.

## 2022-04-09 NOTE — Anesthesia Procedure Notes (Signed)
Anesthesia Regional Block: Popliteal block   Pre-Anesthetic Checklist: , timeout performed,  Correct Patient, Correct Site, Correct Laterality,  Correct Procedure, Correct Position, site marked,  Risks and benefits discussed,  Surgical consent,  Pre-op evaluation,  At surgeon's request and post-op pain management  Laterality: Left and Lower  Prep: chloraprep       Needles:  Injection technique: Single-shot  Needle Type: Stimiplex     Needle Length: 9cm  Needle Gauge: 22     Additional Needles:   Procedures:,,,, ultrasound used (permanent image in chart),,    Narrative:  Start time: 04/09/2022 11:25 AM End time: 04/09/2022 11:30 AM Injection made incrementally with aspirations every 5 mL.  Performed by: Personally  Anesthesiologist: Iran Ouch, MD  Additional Notes: Patient consented for risk and benefits of nerve block including but not limited to nerve damage, failed block, bleeding and infection.  Patient voiced understanding.  Functioning IV was confirmed and monitors were applied.  Timeout done prior to procedure and prior to any sedation being given to the patient.  Patient confirmed procedure site prior to any sedation given to the patient. Sterile prep,hand hygiene and sterile gloves were used.  Minimal sedation used for procedure.  No paresthesia endorsed by patient during the procedure.  Negative aspiration and negative test dose prior to incremental administration of local anesthetic. The patient tolerated the procedure well with no immediate complications.

## 2022-04-09 NOTE — Op Note (Signed)
OPERATIVE NOTE   PROCEDURE: Left below-the-knee amputation  PRE-OPERATIVE DIAGNOSIS: Left foot osteomyelitis  POST-OPERATIVE DIAGNOSIS: same as above  SURGEON: Leotis Pain, MD  ASSISTANT(S): Annalee Genta, NP  ANESTHESIA: general  ESTIMATED BLOOD LOSS: 400 cc  FINDING(S): none  SPECIMEN(S):  Left below-the-knee amputation  INDICATIONS:   Charles [name] is a 61 y.o. male who presents with left foot osteomyelitis.  The patient is scheduled for a left below-the-knee amputation.  I discussed in depth with the patient the risks, benefits, and alternatives to this procedure.  The patient is aware that the risk of this operation included but are not limited to:  bleeding, infection, myocardial infarction, stroke, death, failure to heal amputation wound, and possible need for more proximal amputation.  The patient is aware of the risks and agrees proceed forward with the procedure. An assistant was present during the procedure to help facilitate the exposure and expedite the procedure.   DESCRIPTION:  After full informed written consent was obtained from the patient, the patient was brought back to the operating room, and placed supine upon the operating table.  Prior to induction, the patient received IV antibiotics.  The patient was then prepped and draped in the standard fashion for a below-the-knee amputation.  After obtaining adequate anesthesia, the patient was prepped and draped in the standard fashion for a left below-the-knee amputation. The assistant provided retraction and mobilization to help facilitate exposure and expedite the procedure throughout the entire procedure.  This included following suture, using retractors, and optimizing lighting.  I marked out the anterior incision two finger breadths below the tibial tuberosity and then the marked out a posterior flap that was one third of the circumference of the calf in length.  The proximal and anterior dissection was extremely  tedious and the skin was tight due to his previous surgery and the associated scar tissue.  The tibia was also quite irregular.  I made the incisions for these flaps, and then dissected through the subcutaneous tissue, fascia, and muscle anteriorly.  I elevated  the periosteal tissue superiorly so that the tibia was about 3-4 cm shorter than the anterior skin flap.  I then transected the tibia with a power saw and then took a wedge off the tibia anteriorly with the power saw.  Then I smoothed out the rough edges.  In a similar fashion, I cut back the fibula about two centimeters higher than the level of the tibia with a bone cutter.  I put a bone hook into the distal tibia and then used a large amputation knife to sharply develop a tissue plane through the muscle along the fibula.  In such fashion, the posterior flap was developed.  At this point, the specimen was passed off the field as the below-the-knee amputation.  At this point, I clamped all visibly bleeding arteries and veins using a combination of suture ligation with Silk suture and electrocautery.  Bleeding continued to be controlled with electrocautery and suture ligature.  The stump was washed off with sterile normal saline and no further active bleeding was noted.  I reapproximated the anterior and posterior fascia  with interrupted stitches of 0 Vicryl.  This was completed along the entire length of anterior and posterior fascia until there were no more loose space in the fascial line. I then placed a layer of 2-0 Vicryl sutures in the subcutaneous tissue. The skin was then  reapproximated with staples.  The stump was washed off and dried.  The incision  was dressed with Xeroform and  then fluffs were applied.  Kerlix was wrapped around the leg and then gently an ACE wrap was applied.    COMPLICATIONS: none  CONDITION: stable   Leotis Pain  04/09/2022, 2:11 PM    This note was created with Dragon Medical transcription system. Any errors in  dictation are purely unintentional.

## 2022-04-09 NOTE — Transfer of Care (Signed)
Immediate Anesthesia Transfer of Care Note  Patient: Charles [name]  Procedure(s) Performed: AMPUTATION BELOW KNEE (Left: Knee)  Patient Location: PACU  Anesthesia Type:General  Level of Consciousness: drowsy  Airway & Oxygen Therapy: Patient Spontanous Breathing and Patient connected to face mask oxygen  Post-op Assessment: Report given to RN and Post -op Vital signs reviewed and stable  Post vital signs: Reviewed and stable  Last Vitals:  Vitals Value Taken Time  BP 104/59 04/09/22 1415  Temp 36.6 C 04/09/22 1414  Pulse 81 04/09/22 1417  Resp 16 04/09/22 1417  SpO2 99 % 04/09/22 1417  Vitals shown include unvalidated device data.  Last Pain:  Vitals:   04/09/22 1414  TempSrc:   PainSc: Asleep         Complications: No notable events documented.

## 2022-04-09 NOTE — Interval H&P Note (Signed)
History and Physical Interval Note:  04/09/2022 10:19 AM  Charles [name]  has presented today for surgery, with the diagnosis of OSTEOMYLITIS.  The various methods of treatment have been discussed with the patient and family. After consideration of risks, benefits and other options for treatment, the patient has consented to  Procedure(s): AMPUTATION BELOW KNEE (Left) as a surgical intervention.  The patient's history has been reviewed, patient examined, no change in status, stable for surgery.  I have reviewed the patient's chart and labs.  Questions were answered to the patient's satisfaction.     Leotis Pain

## 2022-04-09 NOTE — Anesthesia Preprocedure Evaluation (Addendum)
Anesthesia Evaluation  Patient identified by MRN, date of birth, ID band Patient awake    Reviewed: Allergy & Precautions, H&P , NPO status , Patient's Chart, lab work & pertinent test results, reviewed documented beta blocker date and time   Airway Mallampati: I  TM Distance: >3 FB Neck ROM: full    Dental  (+) Edentulous Upper, Edentulous Lower   Pulmonary shortness of breath and with exertion, asthma , sleep apnea    Pulmonary exam normal        Cardiovascular Exercise Tolerance: Poor hypertension, On Medications + CAD, + Past MI and + Cardiac Stents  Normal cardiovascular exam Rate:Normal  LHC 06/27/2015: 90% pLAD; PCI performed placing 3.5 x 18 mm Xience Alpine DES x 1. b.) LHC 06/25/2016: EF 55%; no obstructive CAD; patent stent to LAD.   Neuro/Psych   Anxiety Depression     Neuromuscular disease  negative psych ROS   GI/Hepatic Neg liver ROS,neg GERD  ,,  Endo/Other  Hypothyroidism    Renal/GU Renal disease  negative genitourinary   Musculoskeletal  (+) Arthritis ,    Abdominal  (+) + obese  Peds  Hematology  (+) Blood dyscrasia, anemia   Anesthesia Other Findings   Reproductive/Obstetrics negative OB ROS                             Anesthesia Physical Anesthesia Plan  ASA: 3  Anesthesia Plan: General   Post-op Pain Management: Regional block*   Induction: Intravenous  PONV Risk Score and Plan: 3 and Propofol infusion, TIVA, Dexamethasone and Ondansetron  Airway Management Planned: Natural Airway and Nasal Cannula  Additional Equipment:   Intra-op Plan:   Post-operative Plan:   Informed Consent: I have reviewed the patients History and Physical, chart, labs and discussed the procedure including the risks, benefits and alternatives for the proposed anesthesia with the patient or authorized representative who has indicated his/her understanding and acceptance.      Dental advisory given  Plan Discussed with: CRNA  Anesthesia Plan Comments:         Anesthesia Quick Evaluation

## 2022-04-10 ENCOUNTER — Encounter: Payer: Self-pay | Admitting: Vascular Surgery

## 2022-04-10 LAB — BASIC METABOLIC PANEL
Anion gap: 7 (ref 5–15)
BUN: 20 mg/dL (ref 8–23)
CO2: 23 mmol/L (ref 22–32)
Calcium: 8.5 mg/dL — ABNORMAL LOW (ref 8.9–10.3)
Chloride: 107 mmol/L (ref 98–111)
Creatinine, Ser: 1.14 mg/dL (ref 0.61–1.24)
GFR, Estimated: >60 mL/min (ref >60)
Glucose, Bld: 135 mg/dL — ABNORMAL HIGH (ref 70–99)
Potassium: 4.7 mmol/L (ref 3.5–5.1)
Sodium: 137 mmol/L (ref 135–145)

## 2022-04-10 LAB — CBC
HCT: 25 % — ABNORMAL LOW (ref 39.0–52.0)
Hemoglobin: 7.8 g/dL — ABNORMAL LOW (ref 13.0–17.0)
MCH: 25 pg — ABNORMAL LOW (ref 26.0–34.0)
MCHC: 31.2 g/dL (ref 30.0–36.0)
MCV: 80.1 fL (ref 80.0–100.0)
Platelets: 197 10*3/uL (ref 150–400)
RBC: 3.12 MIL/uL — ABNORMAL LOW (ref 4.22–5.81)
RDW: 14.9 % (ref 11.5–15.5)
WBC: 4.9 10*3/uL (ref 4.0–10.5)
nRBC: 0 % (ref 0.0–0.2)

## 2022-04-10 LAB — HEMOGLOBIN AND HEMATOCRIT, BLOOD
HCT: 23 % — ABNORMAL LOW (ref 39.0–52.0)
Hemoglobin: 7.1 g/dL — ABNORMAL LOW (ref 13.0–17.0)

## 2022-04-10 NOTE — Progress Notes (Signed)
[facility]  Daily Progress Note   Subjective  -   Called to bedside by RN the following physical therapy where it was noted that the patient had copious bleeding from his dressing site.  Objective Vitals:   04/09/22 2034 04/09/22 2329 04/10/22 0454 04/10/22 0900  BP: 122/61 (!) 126/99 131/66 (!) 145/82  Pulse: 95 75 75 75  Resp: '17 17 17 16  '$ Temp: 98.7 F (37.1 C) 98.4 F (36.9 C) 98 F (36.7 C) 98.5 F (36.9 C)  TempSrc:    Oral  SpO2: 99% 99% 99% 98%  Weight:      Height:        Intake/Output Summary (Last 24 hours) at 04/10/2022 1530 Last data filed at 04/10/2022 1324 Gross per 24 hour  Intake 1299.09 ml  Output 2950 ml  Net -1650.91 ml    PULM  CTAB CV  RRR VASC  slow trickle ooze from left below-knee amputation site  Laboratory CBC    Component Value Date/Time   WBC 4.9 04/10/2022 0502   HGB 7.8 (L) 04/10/2022 0502   HGB 13.1 04/15/2020 1308   HCT 25.0 (L) 04/10/2022 0502   HCT 38.7 04/15/2020 1308   PLT 197 04/10/2022 0502   PLT 216 04/15/2020 1308    BMET    Component Value Date/Time   NA 137 04/10/2022 0502   NA 138 04/15/2020 1308   NA 140 01/27/2013 2020   K 4.7 04/10/2022 0502   K 3.7 01/27/2013 2020   CL 107 04/10/2022 0502   CL 107 01/27/2013 2020   CO2 23 04/10/2022 0502   CO2 26 01/27/2013 2020   GLUCOSE 135 (H) 04/10/2022 0502   GLUCOSE 85 01/27/2013 2020   BUN 20 04/10/2022 0502   BUN 18 04/15/2020 1308   BUN 16 01/27/2013 2020   CREATININE 1.14 04/10/2022 0502   CREATININE 0.98 01/27/2013 2020   CALCIUM 8.5 (L) 04/10/2022 0502   CALCIUM 8.6 01/27/2013 2020   GFRNONAA >60 04/10/2022 0502   GFRNONAA >60 01/27/2013 2020   GFRAA 97 04/15/2020 1308   GFRAA >60 01/27/2013 2020    Assessment/Planning: POD #1 s/p Left BKA  The wound is still primarily intact.  There was some pulling of staples from skin, likely from swelling and pressure.  However no large open gaping wound. Wound was redressed.  No  significant bleeding noted post dressing. Patient advised to remain on bedrest with elevation for the rest of the evening. We will reevaluate hemoglobin hematocrit given the bleeding from the wound itself to ensure there has not been a precipitous drop.  Charles [name]  04/10/2022, 3:30 PM

## 2022-04-10 NOTE — Plan of Care (Signed)
  Problem: Education: Goal: Knowledge of General Education information will improve Description: Including pain rating scale, medication(s)/side effects and non-pharmacologic comfort measures Outcome: Progressing   Problem: Health Behavior/Discharge Planning: Goal: Ability to manage health-related needs will improve Outcome: Progressing   Problem: Clinical Measurements: Goal: Ability to maintain clinical measurements within normal limits will improve Outcome: Progressing Goal: Diagnostic test results will improve Outcome: Progressing Goal: Cardiovascular complication will be avoided Outcome: Progressing   Problem: Nutrition: Goal: Adequate nutrition will be maintained Outcome: Progressing   Problem: Coping: Goal: Level of anxiety will decrease Outcome: Progressing   Problem: Elimination: Goal: Will not experience complications related to bowel motility Outcome: Progressing Goal: Will not experience complications related to urinary retention Outcome: Progressing   Problem: Pain Managment: Goal: General experience of comfort will improve Outcome: Progressing

## 2022-04-10 NOTE — Anesthesia Postprocedure Evaluation (Signed)
Anesthesia Post Note  Patient: Charles [name]  Procedure(s) Performed: AMPUTATION BELOW KNEE (Left: Knee)  Patient location during evaluation: PACU Anesthesia Type: General Level of consciousness: awake and alert Pain management: pain level controlled Vital Signs Assessment: post-procedure vital signs reviewed and stable Respiratory status: spontaneous breathing, nonlabored ventilation and respiratory function stable Cardiovascular status: blood pressure returned to baseline and stable Postop Assessment: no apparent nausea or vomiting Anesthetic complications: no   No notable events documented.   Last Vitals:  Vitals:   04/10/22 0454 04/10/22 0900  BP: 131/66 (!) 145/82  Pulse: 75 75  Resp: 17 16  Temp: 36.7 C 36.9 C  SpO2: 99% 98%    Last Pain:  Vitals:   04/10/22 0921  TempSrc:   PainSc: [place]

## 2022-04-10 NOTE — Inpatient Diabetes Management (Signed)
Inpatient Diabetes Program Recommendations  AACE/ADA: New Consensus Statement on Inpatient Glycemic Control (2015)  Target Ranges:  Prepandial:   less than 140 mg/dL      Peak postprandial:   less than 180 mg/dL (1-2 hours)      Critically ill patients:  140 - 180 mg/dL     Latest Reference Range & Units 04/08/22 09:58 04/10/22 05:02  Glucose 70 - 99 mg/dL 100 (H) 135 (H)  (H): Data is abnormally high   Latest Reference Range & Units 12/10/21 14:57  Hemoglobin A1C 4.8 - 5.6 % 5.4    Note History of Impaired Fasting Glucose in Chart (see Dr. Marshia Ly notes from 08/31/2021)  No Diabetes Meds at home   Underwent L BKA 12/21  Lab Glucose 135 this AM--of note, pt received 5 mg Decadron X 1 dose yest at 12:47 for Surgery   May consider adding order for CBG checks TID    --Will follow patient during hospitalization--  Wyn Quaker RN, MSN, [place] Diabetes Coordinator Inpatient Glycemic Control Team Team Pager: [phone] (8a-5p)

## 2022-04-10 NOTE — TOC Progression Note (Signed)
Transition of Care [facility]) - Progression Note    Patient Details  Name: Charles [name] MRN: [id number] Date of Birth: 1960-12-06  [facility]) CM/SW [place], RN Phone Number: 04/10/2022, 4:12 PM  Clinical Narrative:    Spoke with the patient, he lives at home with his wife, he has had [place] in the past and thinks it was with [facility], I reached out to [facility] and they are checking to see if they had him and can take him He has access to a 3 in1 but needs a rolling walker [employer] will deliver to the bedside Wife will provide transportation   Expected Discharge Plan: [place] Barriers to Discharge: Continued Medical Work up  Expected Discharge Plan and Services   Discharge Planning Services: CM Consult   Living arrangements for the past 2 months: Single Family Home                 DME Arranged: [place] Rile rolling   Date DME Agency Contacted: 04/10/22 Time DME Agency Contacted: [phone] Representative spoke with at DME Agency: [place]: RN, PT [facility] Agency: [place] ([place]) Date [place]: 04/10/22 Time [place]: [place] Representative spoke with at [place]: [place] ([place]) Interventions SDOH Screenings   Food Insecurity: Food Insecurity Present (12/01/2018)  Transportation Needs: Unmet Transportation Needs (12/01/2018)  Alcohol Screen: Low Risk  (05/08/2021)  Depression (PHQ2-9): Low Risk  (01/20/2022)  Financial Resource Strain: High Risk (12/01/2018)  Physical Activity: Sufficiently Active (12/01/2018)  Social Connections: Socially Integrated (12/01/2018)  Stress: Stress Concern Present (12/01/2018)  Tobacco Use: Low Risk  (04/10/2022)    Readmission Risk Interventions    10/22/2021    1:14 PM 08/31/2021    9:49 AM  Readmission Risk Prevention Plan  Transportation Screening Complete Complete  PCP or Specialist Appt within 3-5 Days Complete Complete  HRI or  Home Care Consult  Complete  Social Work Consult for [place] Planning/Counseling Complete Complete  Palliative Care Screening Not Applicable Not Applicable  Medication Review Press photographer) Complete Complete
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
## 2022-04-10 NOTE — Evaluation (Signed)
Occupational Therapy Evaluation Patient Details Name: Charles [name] MRN: [id number] DOB: 1961/04/04 Today's Date: 04/10/2022   History of Present Illness Charles [name] is a 61 y.o. male who presents with left foot osteomyelitis, undergoing a left below-the-knee amputation on 04/09/2022.   Clinical Impression   Charles [name] presents with generalized weakness, limited endurance, impaired balance, and pain. Prior to admission, pt has been IND in B/IADL, driving, although reports he had to stop working in January 2023 from a position that required being on his feet all day. He states that, since that time, he rarely leaves his house, has gained 35 pounds, and is depressed about being out of work. During today's evaluation, pt reports 8/10 pain at beginning of session, which drops to 6/10 by end of session, following administration of pain meds. Pt is able to transfer supine<sit INDly. He comes into standing from EOB using RW with SUPV, ambulates a short distance in room and transfers to recliner, using hop-step w/ RW, with slight unsteadiness but no overt LOB. Provided educ re: residual limb reshaping/desensitization, falls prevention, routine and home modifications, w pt verbalizing understanding. Recommend ongoing OT while hospitalized, with DC home with [place].   Recommendations for follow up therapy are one component of a multi-disciplinary discharge planning process, led by the attending physician.  Recommendations may be updated based on patient status, additional functional criteria and insurance authorization.   Follow Up Recommendations  Home health OT     Assistance Recommended at Discharge    Patient can return home with the following A little help with bathing/dressing/bathroom;Assistance with cooking/housework;Help with stairs or ramp for entrance;A little help with walking and/or transfers    Functional Status Assessment  Patient has had a recent decline in their functional status  and demonstrates the ability to make significant improvements in function in a reasonable and predictable amount of time.  Equipment Recommendations  Other (comment) (RW)    Recommendations for Other Services       Precautions / Restrictions Precautions Precautions: Fall Restrictions Weight Bearing Restrictions: No      Mobility Bed Mobility Overal bed mobility: Modified Independent                  Transfers Overall transfer level: Needs assistance Equipment used: Rolling walker (2 wheels) Transfers: Sit to/from Stand, Bed to chair/wheelchair/BSC Sit to Stand: Supervision     Step pivot transfers: Supervision     General transfer comment: SUPV for safety with RW      Balance Overall balance assessment: Needs assistance Sitting-balance support: Single extremity supported Sitting balance-Leahy Scale: Good     Standing balance support: Bilateral upper extremity supported, During functional activity, Reliant on assistive device for balance Standing balance-Leahy Scale: Fair                             ADL either performed or assessed with clinical judgement   ADL Overall ADL's : Needs assistance/impaired                     Lower Body Dressing: Modified independent Lower Body Dressing Details (indicate cue type and reason): donning R shoe             Functional mobility during ADLs: Rolling walker (2 wheels)       Vision         Perception     Praxis      Pertinent Vitals/Pain Pain Assessment  Pain Assessment: 0-10 Pain Score: 8  Pain Location: L LE Pain Descriptors / Indicators: Grimacing, Aching Pain Intervention(s): Patient requesting pain meds-RN notified, RN gave pain meds during session, Repositioned, Limited activity within patient's tolerance     Hand Dominance     Extremity/Trunk Assessment Upper Extremity Assessment Upper Extremity Assessment: Overall WFL for tasks assessed   Lower Extremity  Assessment Lower Extremity Assessment: LLE deficits/detail;RLE deficits/detail RLE Deficits / Details: R transmetatarsal amputation LLE Deficits / Details: s/p BKA       Communication Communication Communication: No difficulties   Cognition Arousal/Alertness: Awake/alert Behavior During Therapy: WFL for tasks assessed/performed Overall Cognitive Status: Within Functional Limits for tasks assessed                                       General Comments       Exercises Other Exercises Other Exercises: Educ re: residual limb desensitization, falls prevention, routines modification,   Shoulder Instructions      Home Living Family/patient expects to be discharged to:: Private residence Living Arrangements: Spouse/significant other Available Help at Discharge: Family;Available 24 hours/day Type of Home: House Home Access: Stairs to enter [employer] of Steps: 2 at front door, 3 at side door Entrance Stairs-Rails: None Home Layout: Two level;Able to live on main level with bedroom/bathroom     Bathroom Shower/Tub: Occupational psychologist: Handicapped height     Home Equipment: [place] - single point;Shower seat          Prior Functioning/Environment Prior Level of Function : Independent/Modified Independent;Driving             Mobility Comments: Ambulating w/o AD ADLs Comments: IND in fxl mobility, driving, but had to stop working in January 2023 2/2 foot pain, wounds        OT Problem List: Decreased strength;Decreased range of motion;Decreased activity tolerance;Impaired balance (sitting and/or standing);Pain;Decreased knowledge of use of DME or AE      OT Treatment/Interventions: Self-care/ADL training;Therapeutic exercise;Patient/family education;Balance training;Energy conservation;Therapeutic activities;DME and/or AE instruction    OT Goals(Current goals can be found in the care plan section) Acute Rehab OT Goals Patient  Stated Goal: to be home before Christmas OT Goal Formulation: With patient Time For Goal Achievement: 04/24/22 Potential to Achieve Goals: Good ADL Goals Pt Will Transfer to Toilet: with modified independence;ambulating;stand pivot transfer (using RW) Additional ADL Goal #1: Pt will identify/describe techniques for residual limb reshaping/desensitization Additional ADL Goal #2: Pt will ambulate with step-hop and RW w/o LOB  OT Frequency: Min 2X/week    Co-evaluation              AM-PAC OT "6 Clicks" Daily Activity     Outcome Measure Help from another person eating meals?: None Help from another person taking care of personal grooming?: None Help from another person toileting, which includes using toliet, bedpan, or urinal?: A Little Help from another person bathing (including washing, rinsing, drying)?: A Little Help from another person to put on and taking off regular upper body clothing?: None Help from another person to put on and taking off regular lower body clothing?: A Little 6 Click Score: 21   End of Session Equipment Utilized During Treatment: Rolling walker (2 wheels)  Activity Tolerance: Patient tolerated treatment well Patient left: in chair;with call bell/phone within reach  OT Visit Diagnosis: Unsteadiness on feet (R26.81);Pain;Muscle weakness (generalized) (M62.81);Other abnormalities of gait and  mobility (R26.89) Pain - Right/Left: Left Pain - part of body: Leg                Time: 1610-9604 OT Time Calculation (min): 32 min Charges:  OT General Charges $OT Visit: 1 Visit OT Evaluation $OT Eval Low Complexity: 1 Low OT Treatments $Self Care/Home Management : 23-37 mins Josiah Lobo, PhD, MS, OTR/L 04/10/22, 1:48 PM

## 2022-04-11 LAB — CBC
HCT: 21.7 % — ABNORMAL LOW (ref 39.0–52.0)
Hemoglobin: 6.7 g/dL — ABNORMAL LOW (ref 13.0–17.0)
MCH: 25.3 pg — ABNORMAL LOW (ref 26.0–34.0)
MCHC: 30.9 g/dL (ref 30.0–36.0)
MCV: 81.9 fL (ref 80.0–100.0)
Platelets: 175 10*3/uL (ref 150–400)
RBC: 2.65 MIL/uL — ABNORMAL LOW (ref 4.22–5.81)
RDW: 15.1 % (ref 11.5–15.5)
WBC: 4.1 10*3/uL (ref 4.0–10.5)
nRBC: 0 % (ref 0.0–0.2)

## 2022-04-11 LAB — BASIC METABOLIC PANEL
Anion gap: 5 (ref 5–15)
BUN: 29 mg/dL — ABNORMAL HIGH (ref 8–23)
CO2: 25 mmol/L (ref 22–32)
Calcium: 8.3 mg/dL — ABNORMAL LOW (ref 8.9–10.3)
Chloride: 109 mmol/L (ref 98–111)
Creatinine, Ser: 1.27 mg/dL — ABNORMAL HIGH (ref 0.61–1.24)
GFR, Estimated: >60 mL/min (ref >60)
Glucose, Bld: 107 mg/dL — ABNORMAL HIGH (ref 70–99)
Potassium: 3.9 mmol/L (ref 3.5–5.1)
Sodium: 139 mmol/L (ref 135–145)

## 2022-04-11 LAB — PREPARE RBC (CROSSMATCH)

## 2022-04-11 LAB — HEMOGLOBIN AND HEMATOCRIT, BLOOD
HCT: 21.6 % — ABNORMAL LOW (ref 39.0–52.0)
Hemoglobin: 6.8 g/dL — ABNORMAL LOW (ref 13.0–17.0)

## 2022-04-11 MED ORDER — SODIUM CHLORIDE 0.9% IV SOLUTION: Freq: Once | INTRAVENOUS | Status: AC

## 2022-04-11 NOTE — Plan of Care (Signed)

## 2022-04-11 NOTE — Progress Notes (Signed)
2 Days Post-Op   Subjective/Chief Complaint: Pain in LEFT stump- controlled with current regimen. No further strike though on stump dressing. Otherwise without complaint.   Objective: Vital signs in last 24 hours: Temp:  [97.9 F (36.6 C)] 97.9 F (36.6 C) (12/23 0103) Pulse Rate:  [64-74] 74 (12/23 0904) Resp:  [18] 18 (12/23 0103) BP: (110-118)/(63-66) 118/63 (12/23 0904) SpO2:  [97 %-100 %] 100 % (12/23 0904) Last BM Date : 04/09/22  Intake/Output from previous day: 12/22 0701 - 12/23 0700 In: 540 [P.O.:540] Out: 1600 [Urine:1600] Intake/Output this shift: No intake/output data recorded.  General appearance: alert and no distress Cardio: regular rate and rhythm Extremities: LEFT BKA- thigh soft, dressing- C/D/I  Lab Results:  Recent Labs    04/10/22 0502 04/10/22 1536 04/11/22 0558  WBC 4.9  --  4.1  HGB 7.8* 7.1* 6.7*  HCT 25.0* 23.0* 21.7*  PLT 197  --  175   BMET Recent Labs    04/10/22 0502 04/11/22 0558  NA 137 139  K 4.7 3.9  CL 107 109  CO2 23 25  GLUCOSE 135* 107*  BUN 20 29*  CREATININE 1.14 1.27*  CALCIUM 8.5* 8.3*   PT/INR No results for input(s): "LABPROT", "INR" in the last 72 hours. ABG No results for input(s): "PHART", "HCO3" in the last 72 hours.  Invalid input(s): "PCO2", "PO2"  Studies/Results: [place] OR NERVE BLOCK-IMAGE ONLY [facility])  Result Date: 04/09/2022 There is no interpretation for this exam.  This order is for images obtained during a surgical procedure.  Please See "Surgeries" Tab for more information regarding the procedure.    Anti-infectives: Anti-infectives (From admission, onward)    Start     Dose/Rate Route Frequency Ordered Stop   04/09/22 1048  ceFAZolin (ANCEF) 2-4 GM/100ML-% IVPB       Note to Pharmacy: Maryagnes Amos B: cabinet override      04/09/22 1048 04/09/22 1245   04/09/22 0600  ceFAZolin (ANCEF) IVPB 2g/100 mL premix        2 g 200 mL/hr over 30 Minutes Intravenous On call to O.R. 04/09/22 0218  04/09/22 1255       Assessment/Plan: s/p Procedure(s): AMPUTATION BELOW KNEE (Left) POD #2 HgB 6.7- asymptomatic, no overt S/Sx of bleeding Will transfuse 1 unit pRBC Will monitor stump closely-  Bedrest today  LOS: 2 days    Jamesetta So A 04/11/2022

## 2022-04-11 NOTE — Progress Notes (Addendum)
PT Cancellation Note  Patient Details Name: Charles [name] MRN: [id number] DOB: [birth year]   Cancelled Treatment:     PT attempt. PT hold. Pt will require transfusion due to drop in Hgb to 6.7. Per vascular MD note, "bedrest today." Will hold PT and return tomorrow. Pt has stairs to enter home. PT will assess pt on stairs next session. If unable to safely perform stairs, will need EMS transport home at DC. Author expects pt to do well.    Willette Pa 04/11/2022, 10:17 AM

## 2022-04-11 NOTE — TOC Progression Note (Signed)
Transition of Care [facility]) - Progression Note    Patient Details  Name: Charles [name] MRN: [id number] Date of Birth: 1960-05-26  [facility]) CM/SW Contact  Izola Price, RN Phone Number: 04/11/2022, 1:42 PM  Clinical Narrative:  12/23: Checked with [facility] and they are unable to accept patient. TOC will continue to monitor and search for agency.  Simmie Davies RN CM      Expected Discharge Plan: [place] Barriers to Discharge: Continued Medical Work up  Expected Discharge Plan and Services   Discharge Planning Services: CM Consult   Living arrangements for the past 2 months: Single Family Home                 DME Arranged: Walker rolling   Date DME Agency Contacted: 04/10/22 Time DME Agency Contacted: [phone] Representative spoke with at DME Agency: [place]: RN, PT [facility] Agency: [place] ([place]) Date [place]: 04/10/22 Time [place]: [place] Representative spoke with at [place]: [place] ([place]) Interventions SDOH Screenings   Food Insecurity: Food Insecurity Present (12/01/2018)  Transportation Needs: Unmet Transportation Needs (12/01/2018)  Alcohol Screen: Low Risk  (05/08/2021)  Depression (PHQ2-9): Low Risk  (01/20/2022)  Financial Resource Strain: High Risk (12/01/2018)  Physical Activity: Sufficiently Active (12/01/2018)  Social Connections: Socially Integrated (12/01/2018)  Stress: Stress Concern Present (12/01/2018)  Tobacco Use: Low Risk  (04/10/2022)    Readmission Risk Interventions    04/10/2022    4:15 PM 10/22/2021    1:14 PM 08/31/2021    9:49 AM  Readmission Risk Prevention Plan  Transportation Screening Complete Complete Complete  PCP or Specialist Appt within 3-5 Days  Complete Complete  HRI or [place]   Complete  Social Work Consult for [place] Planning/Counseling  Complete Complete  Palliative Care Screening  Not Applicable  Not Applicable  Medication Review Press photographer) Complete Complete Complete  PCP or Specialist appointment within 3-5 days of discharge Complete    HRI or [place] Complete    SW Recovery Care/Counseling Consult Complete    [place] Not Applicable

## 2022-04-12 LAB — COMPREHENSIVE METABOLIC PANEL
ALT: 21 U/L (ref 0–44)
AST: 63 U/L — ABNORMAL HIGH (ref 15–41)
Albumin: 2.7 g/dL — ABNORMAL LOW (ref 3.5–5.0)
Alkaline Phosphatase: 71 U/L (ref 38–126)
Anion gap: 6 (ref 5–15)
BUN: 14 mg/dL (ref 8–23)
CO2: 23 mmol/L (ref 22–32)
Calcium: 7.7 mg/dL — ABNORMAL LOW (ref 8.9–10.3)
Chloride: 106 mmol/L (ref 98–111)
Creatinine, Ser: 0.82 mg/dL (ref 0.61–1.24)
GFR, Estimated: >60 mL/min (ref >60)
Glucose, Bld: 92 mg/dL (ref 70–99)
Potassium: 3.3 mmol/L — ABNORMAL LOW (ref 3.5–5.1)
Sodium: 135 mmol/L (ref 135–145)
Total Bilirubin: 0.6 mg/dL (ref 0.3–1.2)
Total Protein: 5.7 g/dL — ABNORMAL LOW (ref 6.5–8.1)

## 2022-04-12 LAB — CBC
HCT: 21.8 % — ABNORMAL LOW (ref 39.0–52.0)
Hemoglobin: 6.8 g/dL — ABNORMAL LOW (ref 13.0–17.0)
MCH: 25.6 pg — ABNORMAL LOW (ref 26.0–34.0)
MCHC: 31.2 g/dL (ref 30.0–36.0)
MCV: 82 fL (ref 80.0–100.0)
Platelets: 158 10*3/uL (ref 150–400)
RBC: 2.66 MIL/uL — ABNORMAL LOW (ref 4.22–5.81)
RDW: 15.1 % (ref 11.5–15.5)
WBC: 3.7 10*3/uL — ABNORMAL LOW (ref 4.0–10.5)
nRBC: 0 % (ref 0.0–0.2)

## 2022-04-12 LAB — PREPARE RBC (CROSSMATCH)

## 2022-04-12 MED ORDER — OXYCODONE HCL 5 MG PO TABS
5.0000 mg | ORAL_TABLET | Freq: Four times a day (QID) | ORAL | Status: DC | PRN
  Administered 2022-04-12 – 2022-04-18 (×19): 5 mg via ORAL
  Filled 2022-04-12 (×20): qty 1

## 2022-04-12 MED ORDER — DIPHENHYDRAMINE HCL 50 MG/ML IJ SOLN
25.0000 mg | Freq: Once | INTRAMUSCULAR | Status: AC
  Administered 2022-04-12: 25 mg via INTRAVENOUS
  Filled 2022-04-12: qty 1

## 2022-04-12 MED ORDER — FENTANYL 25 MCG/HR TD PT72
1.0000 | MEDICATED_PATCH | TRANSDERMAL | Status: DC
  Administered 2022-04-12 – 2022-04-15 (×2): 1 via TRANSDERMAL
  Filled 2022-04-12 (×2): qty 1

## 2022-04-12 MED ORDER — NALOXONE HCL 0.4 MG/ML IJ SOLN: 0.4000 mg | INTRAMUSCULAR | Status: DC | PRN

## 2022-04-12 MED ORDER — HYDROMORPHONE 1 MG/ML IV SOLN
INTRAVENOUS | Status: DC
  Administered 2022-04-12: 30 mg via INTRAVENOUS

## 2022-04-12 MED ORDER — OXYCODONE HCL 5 MG PO TABS
10.0000 mg | ORAL_TABLET | ORAL | Status: DC | PRN
  Administered 2022-04-12: 10 mg via ORAL
  Filled 2022-04-12: qty 2

## 2022-04-12 MED ORDER — GABAPENTIN 400 MG PO CAPS
400.0000 mg | ORAL_CAPSULE | Freq: Three times a day (TID) | ORAL | Status: DC
  Administered 2022-04-12 – 2022-04-18 (×19): 400 mg via ORAL
  Filled 2022-04-12 (×19): qty 1

## 2022-04-12 MED ORDER — DIPHENHYDRAMINE HCL 50 MG/ML IJ SOLN: 12.5000 mg | Freq: Four times a day (QID) | INTRAMUSCULAR | Status: DC | PRN

## 2022-04-12 MED ORDER — HYDROMORPHONE HCL 1 MG/ML IJ SOLN
0.5000 mg | INTRAMUSCULAR | Status: DC | PRN
  Administered 2022-04-12 – 2022-04-18 (×22): 0.5 mg via INTRAVENOUS
  Filled 2022-04-12 (×22): qty 1

## 2022-04-12 MED ORDER — SODIUM CHLORIDE 0.9% FLUSH
9.0000 mL | INTRAVENOUS | Status: DC | PRN
  Administered 2022-04-17: 9 mL via INTRAVENOUS

## 2022-04-12 MED ORDER — ONDANSETRON HCL 4 MG/2ML IJ SOLN: 4.0000 mg | Freq: Four times a day (QID) | INTRAMUSCULAR | Status: DC | PRN

## 2022-04-12 MED ORDER — OXYCODONE-ACETAMINOPHEN 5-325 MG PO TABS
1.0000 | ORAL_TABLET | Freq: Four times a day (QID) | ORAL | Status: DC | PRN
  Administered 2022-04-12 – 2022-04-18 (×19): 1 via ORAL
  Filled 2022-04-12 (×21): qty 1

## 2022-04-12 MED ORDER — ACETAMINOPHEN 325 MG PO TABS
650.0000 mg | ORAL_TABLET | Freq: Two times a day (BID) | ORAL | Status: AC
  Administered 2022-04-12 – 2022-04-13 (×4): 650 mg via ORAL
  Filled 2022-04-12 (×4): qty 2

## 2022-04-12 MED ORDER — DIPHENHYDRAMINE HCL 12.5 MG/5ML PO ELIX: 12.5000 mg | ORAL_SOLUTION | Freq: Four times a day (QID) | ORAL | Status: DC | PRN

## 2022-04-12 MED ORDER — SODIUM CHLORIDE 0.9% IV SOLUTION: Freq: Once | INTRAVENOUS | Status: AC

## 2022-04-12 NOTE — Progress Notes (Signed)
PT Cancellation Note  Patient Details Name: Charles [name] MRN: [id number] DOB: 04-25-1960   Cancelled Treatment:    Reason Eval/Treat Not Completed: Medical issues which prohibited therapy;Pain limiting ability to participate   Pt receiving blood again today.  Stated he is in too much pain to participate in session today.  Pt will need stair training prior to discharge.  He has steps with no rails to enter home.  Options are backwards with walker which does seem a bit unsafe vs going up/down in a seated position.  A ramp is the best solution but unavailable at this time.  Will discuss with pt and recommend temporary portable ramp.  PT to see pt tomorrow to follow up as possible discharge tomorrow is planned.

## 2022-04-12 NOTE — Progress Notes (Signed)
3 Days Post-Op   Subjective/Chief Complaint: Complains of uncontrolled LEFT leg pain- states the pain is from his hip/buttock to his toes. States he has not slept. Informed that Baseline patient takes 6-8 Percocet/day prior to this admission.   Objective: Vital signs in last 24 hours: Temp:  [97.7 F (36.5 C)-98.6 F (37 C)] 98 F (36.7 C) (12/24 0537) Pulse Rate:  [61-74] 70 (12/24 0537) Resp:  [16-18] 18 (12/24 0537) BP: (102-141)/(63-75) 129/72 (12/24 0537) SpO2:  [98 %-100 %] 99 % (12/24 0537) Last BM Date : 04/11/22  Intake/Output from previous day: 12/23 0701 - 12/24 0700 In: 1966.2 [P.O.:720; I.V.:616.2; Blood:430; IV [id number]:993] Out: 7169 [Urine:1450] Intake/Output this shift: No intake/output data recorded.  General appearance: alert and mild distress Cardio: regular rate and rhythm Extremities: LEFT BKA- stump incision- C/D/I- mid incision dried blood, soft, viable, tender to touch, mild ecchymosis  Lab Results:  Recent Labs    04/11/22 0558 04/11/22 1916 04/12/22 0604  WBC 4.1  --  3.7*  HGB 6.7* 6.8* 6.8*  HCT 21.7* 21.6* 21.8*  PLT 175  --  158   BMET Recent Labs    04/11/22 0558 04/12/22 0604  NA 139 135  K 3.9 3.3*  CL 109 106  CO2 25 23  GLUCOSE 107* 92  BUN 29* 14  CREATININE 1.27* 0.82  CALCIUM 8.3* 7.7*   PT/INR No results for input(s): "LABPROT", "INR" in the last 72 hours. ABG No results for input(s): "PHART", "HCO3" in the last 72 hours.  Invalid input(s): "PCO2", "PO2"  Studies/Results: No results found.  Anti-infectives: Anti-infectives (From admission, onward)    Start     Dose/Rate Route Frequency Ordered Stop   04/09/22 1048  ceFAZolin (ANCEF) 2-4 GM/100ML-% IVPB       Note to Pharmacy: Maryagnes Amos B: cabinet override      04/09/22 1048 04/09/22 1245   04/09/22 0600  ceFAZolin (ANCEF) IVPB 2g/100 mL premix        2 g 200 mL/hr over 30 Minutes Intravenous On call to O.R. 04/09/22 0218 04/09/22 1255        Assessment/Plan: s/p Procedure(s): AMPUTATION BELOW KNEE (Left) POD #3 s/p Left BKA Pain control- will consult Pain Management for assistance HgB 6.8- no S/Sx of bleeding- will transfuse an additional Unit pRBC  LOS: 3 days    Jamesetta So A 04/12/2022

## 2022-04-12 NOTE — Progress Notes (Signed)
Notified by operator that patient called operator x2 to have vascular surgeon on call paged. This nurse entered room and asked patient what he was attempting to page surgeon for and patient said because of pain. This nurse informed patient that is he wishes to speak with doctor, to notify nurse and nurse can make contact with doctor. Patient verbalized understanding and this nurse contacted MD.

## 2022-04-13 LAB — BPAM RBC
Blood Product Expiration Date: 202312262359
Blood Product Expiration Date: 202401192359
ISSUE DATE / TIME: 202312231215
ISSUE DATE / TIME: 202312241053
Unit Type and Rh: 5100
Unit Type and Rh: 9500

## 2022-04-13 LAB — TYPE AND SCREEN
ABO/RH(D): O NEG
Antibody Screen: NEGATIVE
Unit division: 0
Unit division: 0

## 2022-04-13 LAB — COMPREHENSIVE METABOLIC PANEL
ALT: 22 U/L (ref 0–44)
AST: 50 U/L — ABNORMAL HIGH (ref 15–41)
Albumin: 2.6 g/dL — ABNORMAL LOW (ref 3.5–5.0)
Alkaline Phosphatase: 68 U/L (ref 38–126)
Anion gap: 4 — ABNORMAL LOW (ref 5–15)
BUN: 15 mg/dL (ref 8–23)
CO2: 26 mmol/L (ref 22–32)
Calcium: 8 mg/dL — ABNORMAL LOW (ref 8.9–10.3)
Chloride: 111 mmol/L (ref 98–111)
Creatinine, Ser: 0.84 mg/dL (ref 0.61–1.24)
GFR, Estimated: >60 mL/min (ref >60)
Glucose, Bld: 105 mg/dL — ABNORMAL HIGH (ref 70–99)
Potassium: 4.1 mmol/L (ref 3.5–5.1)
Sodium: 141 mmol/L (ref 135–145)
Total Bilirubin: 0.6 mg/dL (ref 0.3–1.2)
Total Protein: 5.7 g/dL — ABNORMAL LOW (ref 6.5–8.1)

## 2022-04-13 LAB — CBC
HCT: 23.4 % — ABNORMAL LOW (ref 39.0–52.0)
Hemoglobin: 7.3 g/dL — ABNORMAL LOW (ref 13.0–17.0)
MCH: 26.2 pg (ref 26.0–34.0)
MCHC: 31.2 g/dL (ref 30.0–36.0)
MCV: 83.9 fL (ref 80.0–100.0)
Platelets: 177 10*3/uL (ref 150–400)
RBC: 2.79 MIL/uL — ABNORMAL LOW (ref 4.22–5.81)
RDW: 15.3 % (ref 11.5–15.5)
WBC: 3 10*3/uL — ABNORMAL LOW (ref 4.0–10.5)
nRBC: 0 % (ref 0.0–0.2)

## 2022-04-13 LAB — OCCULT BLOOD X 1 CARD TO LAB, STOOL: Fecal Occult Bld: NEGATIVE

## 2022-04-13 MED ORDER — ORAL CARE MOUTH RINSE: 15.0000 mL | OROMUCOSAL | Status: DC | PRN

## 2022-04-13 NOTE — Progress Notes (Signed)
4 Days Post-Op   Subjective/Chief Complaint: Pain controlled with current regimen. Able to rest last night. Received 1 U pRBC. No overt signs/Sx of bleeding. Denies blood in stool.    Objective: Vital signs in last 24 hours: Temp:  [97.5 F (36.4 C)-98.4 F (36.9 C)] 97.5 F (36.4 C) (12/25 0016) Pulse Rate:  [65-87] 66 (12/25 0016) Resp:  [16-19] 17 (12/25 0016) BP: (111-125)/(57-77) 111/73 (12/25 0016) SpO2:  [96 %-100 %] 98 % (12/25 0016) Last BM Date : 04/11/22  Intake/Output from previous day: 12/24 0701 - 12/25 0700 In: 508 [I.V.:42; Blood:366; IV Piggyback:100] Out: 2325 [Urine:2325] Intake/Output this shift: No intake/output data recorded.  General appearance: alert and no distress Resp: clear to auscultation bilaterally Cardio: regular rate and rhythm GI: soft, non-tender; bowel sounds normal; no masses,  no organomegaly Extremities: LEFT Lower extremity- thigh soft,stump- soft, no bleeding, viable, incision- C/D/I  Lab Results:  Recent Labs    04/12/22 0604 04/13/22 0639  WBC 3.7* 3.0*  HGB 6.8* 7.3*  HCT 21.8* 23.4*  PLT 158 177   BMET Recent Labs    04/12/22 0604 04/13/22 0639  NA 135 141  K 3.3* 4.1  CL 106 111  CO2 23 26  GLUCOSE 92 105*  BUN 14 15  CREATININE 0.82 0.84  CALCIUM 7.7* 8.0*   PT/INR No results for input(s): "LABPROT", "INR" in the last 72 hours. ABG No results for input(s): "PHART", "HCO3" in the last 72 hours.  Invalid input(s): "PCO2", "PO2"  Studies/Results: No results found.  Anti-infectives: Anti-infectives (From admission, onward)    Start     Dose/Rate Route Frequency Ordered Stop   04/09/22 1048  ceFAZolin (ANCEF) 2-4 GM/100ML-% IVPB       Note to Pharmacy: Maryagnes Amos B: cabinet override      04/09/22 1048 04/09/22 1245   04/09/22 0600  ceFAZolin (ANCEF) IVPB 2g/100 mL premix        2 g 200 mL/hr over 30 Minutes Intravenous On call to O.R. 04/09/22 0218 04/09/22 1255       Assessment/Plan: s/p  Procedure(s): AMPUTATION BELOW KNEE (Left) POD #4 Pain better controlled- continue current regimen Continue PT/OOB Monitor HgB- will guiac; on Pepcid; Possible other etiology of dropping HgB, will consult GI- prior history of mild dysplastic cecal polyp  LOS: 4 days    Charles [name] A 04/13/2022

## 2022-04-13 NOTE — Plan of Care (Signed)

## 2022-04-14 ENCOUNTER — Telehealth: Payer: Self-pay | Admitting: Physician Assistant

## 2022-04-14 ENCOUNTER — Other Ambulatory Visit: Payer: Self-pay | Admitting: Physician Assistant

## 2022-04-14 DIAGNOSIS — G4719 Other hypersomnia: Secondary | ICD-10-CM

## 2022-04-14 LAB — CBC
HCT: 23.7 % — ABNORMAL LOW (ref 39.0–52.0)
Hemoglobin: 7.3 g/dL — ABNORMAL LOW (ref 13.0–17.0)
MCH: 25.7 pg — ABNORMAL LOW (ref 26.0–34.0)
MCHC: 30.8 g/dL (ref 30.0–36.0)
MCV: 83.5 fL (ref 80.0–100.0)
Platelets: 207 10*3/uL (ref 150–400)
RBC: 2.84 MIL/uL — ABNORMAL LOW (ref 4.22–5.81)
RDW: 15.5 % (ref 11.5–15.5)
WBC: 3.9 10*3/uL — ABNORMAL LOW (ref 4.0–10.5)
nRBC: 0 % (ref 0.0–0.2)

## 2022-04-14 LAB — COMPREHENSIVE METABOLIC PANEL
ALT: 22 U/L (ref 0–44)
AST: 44 U/L — ABNORMAL HIGH (ref 15–41)
Albumin: 2.6 g/dL — ABNORMAL LOW (ref 3.5–5.0)
Alkaline Phosphatase: 69 U/L (ref 38–126)
Anion gap: 6 (ref 5–15)
BUN: 11 mg/dL (ref 8–23)
CO2: 25 mmol/L (ref 22–32)
Calcium: 8 mg/dL — ABNORMAL LOW (ref 8.9–10.3)
Chloride: 109 mmol/L (ref 98–111)
Creatinine, Ser: 0.77 mg/dL (ref 0.61–1.24)
GFR, Estimated: >60 mL/min (ref >60)
Glucose, Bld: 97 mg/dL (ref 70–99)
Potassium: 3.6 mmol/L (ref 3.5–5.1)
Sodium: 140 mmol/L (ref 135–145)
Total Bilirubin: 0.5 mg/dL (ref 0.3–1.2)
Total Protein: 5.9 g/dL — ABNORMAL LOW (ref 6.5–8.1)

## 2022-04-14 LAB — SURGICAL PATHOLOGY

## 2022-04-14 NOTE — Plan of Care (Signed)

## 2022-04-14 NOTE — TOC Progression Note (Signed)
Transition of Care [facility]) - Progression Note    Patient Details  Name: Charles [name] MRN: [id number] Date of Birth: 27-Nov-1960  Transition of Care [facility]) CM/SW [place], RN Phone Number: 04/14/2022, 11:27 AM  Clinical Narrative:     Reached out to Merleen Nicely at [facility], to check to see if they will accept the patient, they are not able to accept the patient I reached out to [facility] with [facility] and requested to see if they can accept the patient waiting on a response  Expected Discharge Plan: [place] Barriers to Discharge: Continued Medical Work up  Expected Discharge Plan and Services   Discharge Planning Services: CM Consult   Living arrangements for the past 2 months: Single Family Home                 DME Arranged: Gilford Rile rolling   Date DME Agency Contacted: 04/10/22 Time DME Agency Contacted: [phone] Representative spoke with at DME Agency: [place]: RN, PT [facility] Agency: [place] ([place]) Date [place]: 04/10/22 Time [place]: [place] Representative spoke with at [place]: [place] (SDOH) Interventions SDOH Screenings   Food Insecurity: Food Insecurity Present (12/01/2018)  Transportation Needs: Unmet Transportation Needs (12/01/2018)  Alcohol Screen: Low Risk  (05/08/2021)  Depression (PHQ2-9): Low Risk  (01/20/2022)  Financial Resource Strain: High Risk (12/01/2018)  Physical Activity: Sufficiently Active (12/01/2018)  Social Connections: Socially Integrated (12/01/2018)  Stress: Stress Concern Present (12/01/2018)  Tobacco Use: Low Risk  (04/10/2022)    Readmission Risk Interventions    04/10/2022    4:15 PM 10/22/2021    1:14 PM 08/31/2021    9:49 AM  Readmission Risk Prevention Plan  Transportation Screening Complete Complete Complete  PCP or Specialist Appt within 3-5 Days  Complete Complete  HRI or [place]   Complete  Social Work Consult for  [place] Planning/Counseling  Complete Complete  Palliative Care Screening  Not Applicable Not Applicable  Medication Review Press photographer) Complete Complete Complete  PCP or Specialist appointment within 3-5 days of discharge Complete    HRI or [place] Complete    SW Recovery Care/Counseling Consult Complete    [place] Not Applicable
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
## 2022-04-14 NOTE — Progress Notes (Signed)
[facility]  Daily Progress Note   Subjective  -   Pain control is better.  Doing well with PT.  Straightening his knee well.  Objective Vitals:   04/13/22 0852 04/13/22 1606 04/14/22 0045 04/14/22 0738  BP: 132/65 134/73 115/62 139/78  Pulse: 67 69 64 66  Resp: '17 17 18 16  '$ Temp: 98.2 F (36.8 C) 97.7 F (36.5 C) 97.9 F (36.6 C) 98.1 F (36.7 C)  TempSrc:      SpO2: 97% 100% 98% 97%  Weight:      Height:        Intake/Output Summary (Last 24 hours) at 04/14/2022 1431 Last data filed at 04/14/2022 0854 Gross per 24 hour  Intake 100 ml  Output 1200 ml  Net -1100 ml    PULM  CTAB CV  RRR VASC  Dressing C/D/I  Laboratory CBC    Component Value Date/Time   WBC 3.9 (L) 04/14/2022 0758   HGB 7.3 (L) 04/14/2022 0758   HGB 13.1 04/15/2020 1308   HCT 23.7 (L) 04/14/2022 0758   HCT 38.7 04/15/2020 1308   PLT 207 04/14/2022 0758   PLT 216 04/15/2020 1308    BMET    Component Value Date/Time   NA 140 04/14/2022 0758   NA 138 04/15/2020 1308   NA 140 01/27/2013 2020   K 3.6 04/14/2022 0758   K 3.7 01/27/2013 2020   CL 109 04/14/2022 0758   CL 107 01/27/2013 2020   CO2 25 04/14/2022 0758   CO2 26 01/27/2013 2020   GLUCOSE 97 04/14/2022 0758   GLUCOSE 85 01/27/2013 2020   BUN 11 04/14/2022 0758   BUN 18 04/15/2020 1308   BUN 16 01/27/2013 2020   CREATININE 0.77 04/14/2022 0758   CREATININE 0.98 01/27/2013 2020   CALCIUM 8.0 (L) 04/14/2022 0758   CALCIUM 8.6 01/27/2013 2020   GFRNONAA >60 04/14/2022 0758   GFRNONAA >60 01/27/2013 2020   GFRAA 97 04/15/2020 1308   GFRAA >60 01/27/2013 2020    Assessment/Planning: POD #5 s/p left BKA for osteomyelitis  Doing well Plan for home with home health tomorrow or Thursday   Leotis Pain  04/14/2022, 2:31 PM

## 2022-04-15 LAB — CBC
HCT: 24.3 % — ABNORMAL LOW (ref 39.0–52.0)
Hemoglobin: 7.4 g/dL — ABNORMAL LOW (ref 13.0–17.0)
MCH: 25.5 pg — ABNORMAL LOW (ref 26.0–34.0)
MCHC: 30.5 g/dL (ref 30.0–36.0)
MCV: 83.8 fL (ref 80.0–100.0)
Platelets: 237 10*3/uL (ref 150–400)
RBC: 2.9 MIL/uL — ABNORMAL LOW (ref 4.22–5.81)
RDW: 15.6 % — ABNORMAL HIGH (ref 11.5–15.5)
WBC: 5.1 10*3/uL (ref 4.0–10.5)
nRBC: 0 % (ref 0.0–0.2)

## 2022-04-15 LAB — COMPREHENSIVE METABOLIC PANEL
ALT: 21 U/L (ref 0–44)
AST: 31 U/L (ref 15–41)
Albumin: 2.7 g/dL — ABNORMAL LOW (ref 3.5–5.0)
Alkaline Phosphatase: 75 U/L (ref 38–126)
Anion gap: 6 (ref 5–15)
BUN: 11 mg/dL (ref 8–23)
CO2: 28 mmol/L (ref 22–32)
Calcium: 8.2 mg/dL — ABNORMAL LOW (ref 8.9–10.3)
Chloride: 108 mmol/L (ref 98–111)
Creatinine, Ser: 0.79 mg/dL (ref 0.61–1.24)
GFR, Estimated: >60 mL/min (ref >60)
Glucose, Bld: 99 mg/dL (ref 70–99)
Potassium: 3.8 mmol/L (ref 3.5–5.1)
Sodium: 142 mmol/L (ref 135–145)
Total Bilirubin: 0.4 mg/dL (ref 0.3–1.2)
Total Protein: 6.1 g/dL — ABNORMAL LOW (ref 6.5–8.1)

## 2022-04-15 NOTE — Progress Notes (Signed)
Patient called RN ascom requesting pain medication. Upon entering room, patient informed this RN he'd fallen and hit his stump on the ground. Bleeding noted to dressing; old dressing removed; site assessed (no visible trauma and bleeding had stopped + coagulated) and new compression dressing applied. No new orders per provider. Provided patient with pain medication per request and re-educated on fall precautions. Patient states a pillow he uses to prop his stump on fell onto the floor and his foot was on it and it slid away causing his fall.    04/15/22 0415  What Happened  Was fall witnessed? No  Was patient injured? Yes  Patient found other (Comment) (in bed)  Found by No one-pt stated they fell  Stated prior activity other (comment) (standing at bedside to void)  Provider Notification  Provider Name/Title Dr. Lucky Cowboy  Date Provider Notified 04/15/22  Time Provider Notified [phone]  Method of Notification Page  Notification Reason Fall  Provider response No new orders  Date of Provider Response 04/15/22  Time of Provider Response 0531  Follow Up  Family notified No - patient refusal  Additional tests No  Simple treatment Dressing  Progress note created (see row info) Yes  Adult Fall Risk Assessment  Risk Factor Category (scoring not indicated) Fall has occurred during this admission (document High fall risk)  Patient Fall Risk Level High fall risk  Adult Fall Risk Interventions  Required Bundle Interventions *See Row Information* High fall risk - low, moderate, and high requirements implemented  Additional Interventions Use of appropriate toileting equipment (bedpan, BSC, etc.)  Screening for Fall Injury Risk (To be completed on HIGH fall risk patients) - Assessing Need for Floor Mats  Risk For Fall Injury- Criteria for Floor Mats Previous fall this admission  Will Implement Floor Mats Yes  Vitals  Temp 98.3 F (36.8 C)  Temp Source Oral  BP (!) 141/79  BP Location Left Arm  BP  Method Automatic  Patient Position (if appropriate) Sitting  Pulse Rate 86  Pulse Rate Source Monitor  Resp 14  Oxygen Therapy  SpO2 100 %  O2 Device Room Air  Pain Assessment  Pain Score 9  Neurological  Neuro (WDL) WDL  Level of Consciousness Alert  Orientation Level Oriented X4  Musculoskeletal  Musculoskeletal (WDL) X  Assistive Device Front wheel walker  Generalized Weakness Yes  Weight Bearing Restrictions Yes  RLE Weight Bearing WBAT  Musculoskeletal Details  LLE BKA  Integumentary  Integumentary (WDL) X

## 2022-04-15 NOTE — Telephone Encounter (Signed)
Please send VYvNASE
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
## 2022-04-15 NOTE — Plan of Care (Signed)

## 2022-04-15 NOTE — TOC Progression Note (Addendum)
Transition of Care [facility]) - Progression Note    Patient Details  Name: Charles [name] MRN: [id number] Date of Birth: 09-02-1960  Transition of [facility]) CM/SW [place], RN Phone Number: 04/15/2022, 9:29 AM  Clinical Narrative:    [facility] is not able to accept the patient for [facility], I reached out to  [facility] home health and am awaiting to see if they can accept the patient  Update, [facility] is not able to accept any Medicaid at this time The patient may need to go to Outpatient PT for Therapy  Expected Discharge Plan: [place] Barriers to Discharge: Continued Medical Work up  Expected Discharge Plan and Services   Discharge Planning Services: CM Consult   Living arrangements for the past 2 months: Single Family Home                 DME Arranged: Gilford Rile rolling   Date DME Agency Contacted: 04/10/22 Time DME Agency Contacted: [phone] Representative spoke with at DME Agency: Suanne Marker HH Arranged: RN, PT     Social Determinants of Health (SDOH) Interventions SDOH Screenings   Food Insecurity: Food Insecurity Present (12/01/2018)  Transportation Needs: Unmet Transportation Needs (12/01/2018)  Alcohol Screen: Low Risk  (05/08/2021)  Depression (PHQ2-9): Low Risk  (01/20/2022)  Financial Resource Strain: High Risk (12/01/2018)  Physical Activity: Sufficiently Active (12/01/2018)  Social Connections: Socially Integrated (12/01/2018)  Stress: Stress Concern Present (12/01/2018)  Tobacco Use: Low Risk  (04/10/2022)    Readmission Risk Interventions    04/10/2022    4:15 PM 10/22/2021    1:14 PM 08/31/2021    9:49 AM  Readmission Risk Prevention Plan  Transportation Screening Complete Complete Complete  PCP or Specialist Appt within 3-5 Days  Complete Complete  HRI or [place]   Complete  Social Work Consult for [place] Planning/Counseling  Complete Complete  Palliative Care Screening  Not Applicable Not Applicable  Medication Review Designer, fashion/clothing) Complete Complete Complete  PCP or Specialist appointment within 3-5 days of discharge Complete    HRI or [place] Complete    SW Recovery Care/Counseling Consult Complete    [place] Not Applicable

## 2022-04-15 NOTE — TOC Progression Note (Addendum)
Transition of Care [facility]) - Progression Note    Patient Details  Name: Charles [name] MRN: [id number] Date of Birth: 1961/02/27  Transition of Care [facility]) CM/SW [place], RN Phone Number: 04/15/2022, 3:58 PM  Clinical Narrative:   Reached out to [facility] and requested to see if they could accept the patient, I reached out to [facility] and asked if they could accept the patient Reached out to [facility] to see if they can accept the patient, awaiting a repsonse  Update, Blanca Friend is ONN with his ins, Roseanne Reno is shutting down the local office and cannot take him, Alvis Lemmings is ONN with his insurance  Expected Discharge Plan: [place] Barriers to Discharge: No [place] will accept this patient  Expected Discharge Plan and Services   Discharge Planning Services: CM Consult   Living arrangements for the past 2 months: [place]                 DME Arranged: Gilford Rile rolling   Date DME Agency Contacted: 04/10/22 Time DME Agency Contacted: [phone] Representative spoke with at DME Agency: Suanne Marker HH Arranged: RN, PT [facility] Agency: Other - See comment ([place]) Date [place]: 04/15/22 Time [place]: [phone] Representative spoke with at [place]: [place] ([place]) Interventions SDOH Screenings   Food Insecurity: Food Insecurity Present (12/01/2018)  Transportation Needs: Unmet Transportation Needs (12/01/2018)  Alcohol Screen: Low Risk  (05/08/2021)  Depression (PHQ2-9): Low Risk  (01/20/2022)  Financial Resource Strain: High Risk (12/01/2018)  Physical Activity: Sufficiently Active (12/01/2018)  Social Connections: Socially Integrated (12/01/2018)  Stress: Stress Concern Present (12/01/2018)  Tobacco Use: Low Risk  (04/10/2022)    Readmission Risk Interventions    04/10/2022    4:15 PM 10/22/2021    1:14 PM 08/31/2021    9:49 AM  Readmission Risk Prevention Plan  Transportation Screening  Complete Complete Complete  PCP or Specialist Appt within 3-5 Days  Complete Complete  HRI or [place]   Complete  Social Work Consult for [place] Planning/Counseling  Complete Complete  Palliative Care Screening  Not Applicable Not Applicable  Medication Review Press photographer) Complete Complete Complete  PCP or Specialist appointment within 3-5 days of discharge Complete    HRI or [place] Complete    SW Recovery Care/Counseling Consult Complete    [place] Not Applicable

## 2022-04-15 NOTE — Telephone Encounter (Signed)
Lmom  alyssa send med

## 2022-04-15 NOTE — Telephone Encounter (Signed)
Can you please send  

## 2022-04-15 NOTE — Telephone Encounter (Signed)
sent 

## 2022-04-16 LAB — COMPREHENSIVE METABOLIC PANEL
ALT: 18 U/L (ref 0–44)
AST: 22 U/L (ref 15–41)
Albumin: 2.6 g/dL — ABNORMAL LOW (ref 3.5–5.0)
Alkaline Phosphatase: 76 U/L (ref 38–126)
Anion gap: 6 (ref 5–15)
BUN: 16 mg/dL (ref 8–23)
CO2: 26 mmol/L (ref 22–32)
Calcium: 8.2 mg/dL — ABNORMAL LOW (ref 8.9–10.3)
Chloride: 110 mmol/L (ref 98–111)
Creatinine, Ser: 0.82 mg/dL (ref 0.61–1.24)
GFR, Estimated: >60 mL/min (ref >60)
Glucose, Bld: 103 mg/dL — ABNORMAL HIGH (ref 70–99)
Potassium: 3.5 mmol/L (ref 3.5–5.1)
Sodium: 142 mmol/L (ref 135–145)
Total Bilirubin: 0.4 mg/dL (ref 0.3–1.2)
Total Protein: 6.2 g/dL — ABNORMAL LOW (ref 6.5–8.1)

## 2022-04-16 LAB — CBC
HCT: 23.4 % — ABNORMAL LOW (ref 39.0–52.0)
Hemoglobin: 7.3 g/dL — ABNORMAL LOW (ref 13.0–17.0)
MCH: 25.7 pg — ABNORMAL LOW (ref 26.0–34.0)
MCHC: 31.2 g/dL (ref 30.0–36.0)
MCV: 82.4 fL (ref 80.0–100.0)
Platelets: 281 10*3/uL (ref 150–400)
RBC: 2.84 MIL/uL — ABNORMAL LOW (ref 4.22–5.81)
RDW: 15.6 % — ABNORMAL HIGH (ref 11.5–15.5)
WBC: 5 10*3/uL (ref 4.0–10.5)
nRBC: 0 % (ref 0.0–0.2)
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
## 2022-04-16 NOTE — Plan of Care (Signed)

## 2022-04-16 NOTE — Progress Notes (Signed)
[facility]  Daily Progress Note   Subjective  - POD # 7 S/P Left below the knee amputation   Charles [name] is a 61 year old male now status post op day 7 from a left below the knee amputation.  Patient is healing as expected.  On 1226 patient decided to get out of bed at night.  He slipped and fell on a pillow and landed on his stump.  Since this time the patient stump has been oozing serosanguineous fluid.  Dressing was changed yesterday noted to have serous sanguinous drainage.  This morning on exam serosanguineous fluid is apparent through the dressing.  Dressing will be changed daily.  Patient wishes to go home at this point time.  There are difficulties in finding him home health and physical therapy at home.  Therefore I have elected to keep the patient in the hospital until the serosanguineous drainage stops.  At this point in time I may be able to convert dressing changes to twice a week.  His wife who cares for him at home may be able to do this as well as be able to transport him back and forth for physical therapy.  She will continue to work with physical therapy while here.  Objective Vitals:   04/15/22 2016 04/15/22 2321 04/16/22 0510 04/16/22 0832  BP: 136/69 135/88 126/63 139/69  Pulse: 80 77 72 79  Resp: '20 20 20   '$ Temp: 99.1 F (37.3 C) 98.1 F (36.7 C) 98 F (36.7 C) 97.7 F (36.5 C)  TempSrc:      SpO2: 96% 96% 97% 97%  Weight:      Height:        Intake/Output Summary (Last 24 hours) at 04/16/2022 1357 Last data filed at 04/16/2022 0838 Gross per 24 hour  Intake 150 ml  Output 650 ml  Net -500 ml    PULM  CTAB CV  RRR VASC  drainage noted to incision line of the left above-the-knee amputation stump.  Staples all remain intact.  Signs or symptoms of infection or hematoma to note.   Laboratory CBC    Component Value Date/Time   WBC 5.0 04/16/2022 0748   HGB 7.3 (L) 04/16/2022 0748   HGB 13.1 04/15/2020 1308   HCT 23.4 (L)  04/16/2022 0748   HCT 38.7 04/15/2020 1308   PLT 281 04/16/2022 0748   PLT 216 04/15/2020 1308    BMET    Component Value Date/Time   NA 142 04/16/2022 0748   NA 138 04/15/2020 1308   NA 140 01/27/2013 2020   K 3.5 04/16/2022 0748   K 3.7 01/27/2013 2020   CL 110 04/16/2022 0748   CL 107 01/27/2013 2020   CO2 26 04/16/2022 0748   CO2 26 01/27/2013 2020   GLUCOSE 103 (H) 04/16/2022 0748   GLUCOSE 85 01/27/2013 2020   BUN 16 04/16/2022 0748   BUN 18 04/15/2020 1308   BUN 16 01/27/2013 2020   CREATININE 0.82 04/16/2022 0748   CREATININE 0.98 01/27/2013 2020   CALCIUM 8.2 (L) 04/16/2022 0748   CALCIUM 8.6 01/27/2013 2020   GFRNONAA >60 04/16/2022 0748   GFRNONAA >60 01/27/2013 2020   GFRAA 97 04/15/2020 1308   GFRAA >60 01/27/2013 2020    Assessment/Planning: POD # 7 s/p left above-the-knee amputation.  Patient is status post fall on stump on 04/14/2022.  Since this time he has had some serous sanguinous drainage from the incision line.  Orders have been changed to  daily dressing changes while in the hospital.  Plan is to keep the patient until serosanguineous drainage stops and dressing changes can be done twice a week at home. We are unable to obtain home health nursing and home health physical therapy for this patient due to insurance reasons.  He will then once discharged have to come to the office for dressing changes.  His wife who lives with him and helps care for him we will have to take him to physical therapy 3 times a week.  Patient was made aware of these plans.  Patient will be discharged accordingly.  Drema Pry  04/16/2022, 1:57 PM

## 2022-04-16 NOTE — TOC Progression Note (Signed)
Transition of Care [facility]) - Progression Note    Patient Details  Name: Charles [name] MRN: [id number] Date of Birth: 1961/04/15  Transition of Care [facility]) CM/SW [place], RN Phone Number: 04/16/2022, 9:16 AM  Clinical Narrative:   The Patient will need to go to Outpatient for PT and/or OT, he will have to get wound care in the Physician's office and at home with his wife.  I have not been able to obtain a Home health agency to accept the patient, made physician and team aware.  He has the DME he needs at home rolling walker is in the room provided by [employer].  His wife will provide transportation    Expected Discharge Plan: OP Rehab Barriers to Discharge: No [place] will accept this patient  Expected Discharge Plan and Services   Discharge Planning Services: CM Consult   Living arrangements for the past 2 months: Single Family Home                 DME Arranged: Walker rolling   Date DME Agency Contacted: 04/10/22 Time DME Agency Contacted: [phone] Representative spoke with at DME Agency: Suanne Marker HH Arranged: RN, PT [place] Agency: NA Date [place]: 04/15/22 Time [place]: [phone] Representative spoke with at [place]: Unable to [place] a [place] agency due to Ins   Social Determinants of Health (SDOH) Interventions SDOH Screenings   Food Insecurity: Food Insecurity Present (12/01/2018)  Transportation Needs: Unmet Transportation Needs (12/01/2018)  Alcohol Screen: Low Risk  (05/08/2021)  Depression (PHQ2-9): Low Risk  (01/20/2022)  Financial Resource Strain: High Risk (12/01/2018)  Physical Activity: Sufficiently Active (12/01/2018)  Social Connections: Socially Integrated (12/01/2018)  Stress: Stress Concern Present (12/01/2018)  Tobacco Use: Low Risk  (04/10/2022)    Readmission Risk Interventions    04/10/2022    4:15 PM 10/22/2021    1:14 PM 08/31/2021    9:49 AM  Readmission Risk Prevention Plan  Transportation Screening Complete  Complete Complete  PCP or Specialist Appt within 3-5 Days  Complete Complete  HRI or [place]   Complete  Social Work Consult for [place] Planning/Counseling  Complete Complete  Palliative Care Screening  Not Applicable Not Applicable  Medication Review Press photographer) Complete Complete Complete  PCP or Specialist appointment within 3-5 days of discharge Complete    HRI or [place] Complete    SW Recovery Care/Counseling Consult Complete    [place] Not Applicable

## 2022-04-16 NOTE — Plan of Care (Signed)
  Problem: Clinical Measurements: Goal: Ability to maintain clinical measurements within normal limits will improve Outcome: Progressing   Problem: Clinical Measurements: Goal: Will remain free from infection Outcome: Progressing   Problem: Clinical Measurements: Goal: Diagnostic test results will improve Outcome: Progressing   Problem: Nutrition: Goal: Adequate nutrition will be maintained Outcome: Progressing   Problem: Elimination: Goal: Will not experience complications related to urinary retention Outcome: Progressing   Problem: Education: Goal: Knowledge of the prescribed therapeutic regimen will improve Outcome: Progressing   Problem: Self-Care: Goal: Ability to meet self-care needs will improve Outcome: Progressing

## 2022-04-16 NOTE — Progress Notes (Signed)
Patient refusing bed alarm. I did explain the importance and how he has already had a fall. He said he would call when he needs to get up to use the urinal. I've let the NT know when he calls, he will need help immediately.

## 2022-04-17 ENCOUNTER — Inpatient Hospital Stay: Payer: 59

## 2022-04-17 LAB — COMPREHENSIVE METABOLIC PANEL
ALT: 17 U/L (ref 0–44)
AST: 23 U/L (ref 15–41)
Albumin: 2.7 g/dL — ABNORMAL LOW (ref 3.5–5.0)
Alkaline Phosphatase: 75 U/L (ref 38–126)
Anion gap: 9 (ref 5–15)
BUN: 10 mg/dL (ref 8–23)
CO2: 24 mmol/L (ref 22–32)
Calcium: 8 mg/dL — ABNORMAL LOW (ref 8.9–10.3)
Chloride: 101 mmol/L (ref 98–111)
Creatinine, Ser: 0.74 mg/dL (ref 0.61–1.24)
GFR, Estimated: >60 mL/min (ref >60)
Glucose, Bld: 117 mg/dL — ABNORMAL HIGH (ref 70–99)
Potassium: 3.3 mmol/L — ABNORMAL LOW (ref 3.5–5.1)
Sodium: 134 mmol/L — ABNORMAL LOW (ref 135–145)
Total Bilirubin: 0.5 mg/dL (ref 0.3–1.2)
Total Protein: 6.3 g/dL — ABNORMAL LOW (ref 6.5–8.1)

## 2022-04-17 LAB — CBC
HCT: 23.7 % — ABNORMAL LOW (ref 39.0–52.0)
Hemoglobin: 7.4 g/dL — ABNORMAL LOW (ref 13.0–17.0)
MCH: 25.3 pg — ABNORMAL LOW (ref 26.0–34.0)
MCHC: 31.2 g/dL (ref 30.0–36.0)
MCV: 81.2 fL (ref 80.0–100.0)
Platelets: 328 10*3/uL (ref 150–400)
RBC: 2.92 MIL/uL — ABNORMAL LOW (ref 4.22–5.81)
RDW: 15.6 % — ABNORMAL HIGH (ref 11.5–15.5)
WBC: 3.9 10*3/uL — ABNORMAL LOW (ref 4.0–10.5)
nRBC: 0 % (ref 0.0–0.2)

## 2022-04-17 NOTE — Progress Notes (Signed)
Patient fell on the floor when he stand up to pee using a urinal when he tried to pull his pants down,  patient found by the writer lying on his back on the floor. Patient assisted to stand up and put back in bed. Assessment done, no bruises noted, no pain on the back, vital signs taken. Dr. Lucky Cowboy notified. Patient bed alarm was off since patient refused to put the bed alarm on. Patient is educate the importance of the bed alarm. The patient was educated and instructed to call if he needs assistance in getting up, call light and phone at the bedside.

## 2022-04-17 NOTE — Progress Notes (Signed)
[facility]  Daily Progress Note   Subjective  - POD # 8 S/P Left below the knee amputation    Shaun Runyon is a 61 year old male now status post op day 7 from a left below the knee amputation.  Patient is healing as expected.  On 12/26 patient decided to get out of bed at night.  He slipped and fell on a pillow and landed on his stump.  Patient tried to ambulate to the bathroom last night and fell on his back. No complications from the fall noted. Patient requested a foley catheter be placed and one was placed. Since this time the patient stump has been oozing serosanguineous fluid.  Dressing was changed today noted to have serous sanguinous drainage.  This morning on exam serosanguineous fluid is apparent through the dressing.  Dressing will be changed daily. Incision line is showing a slight separation in areas due to swelling of the extremity. Will continue to monitor. Patient was told he may have to return to the operating room next week for a revision and washout of the stump. Patient was cold this morning and coughing. Vitals show a low grade temp of 99.7.   Patient wishes to go home at this point time. There are difficulties in finding him home health and physical therapy at home. Therefore I have elected to keep the patient in the hospital until the serosanguineous drainage stops. At this point in time I may be able to convert dressing changes to twice a week. His wife who cares for him at home may be able to do this as well as be able to transport him back and forth for physical therapy. She will continue to work with physical therapy while here.   Objective Vitals:   04/16/22 2354 04/17/22 0211 04/17/22 0926 04/17/22 1118  BP: (!) 143/76 (!) 141/76 134/89 (!) 146/79  Pulse: 90 93 74 88  Resp: '18 18 16 18  '$ Temp: 98.6 F (37 C) 99.7 F (37.6 C) 99.1 F (37.3 C) 98 F (36.7 C)  TempSrc: Oral   Axillary  SpO2: 96% 97% 98% 98%  Weight:      Height:         Intake/Output Summary (Last 24 hours) at 04/17/2022 1341 Last data filed at 04/17/2022 0930 Gross per 24 hour  Intake 143.67 ml  Output 3000 ml  Net -2856.33 ml    PULM  CTAB CV  RRR VASC  drainage noted to incision line of the left above-the-knee amputation stump. Staples all remain intact. Signs or symptoms of infection or hematoma to note. Incision line separation noted in areas.   Laboratory CBC    Component Value Date/Time   WBC 3.9 (L) 04/17/2022 0603   HGB 7.4 (L) 04/17/2022 0603   HGB 13.1 04/15/2020 1308   HCT 23.7 (L) 04/17/2022 0603   HCT 38.7 04/15/2020 1308   PLT 328 04/17/2022 0603   PLT 216 04/15/2020 1308    BMET    Component Value Date/Time   NA 134 (L) 04/17/2022 0603   NA 138 04/15/2020 1308   NA 140 01/27/2013 2020   K 3.3 (L) 04/17/2022 0603   K 3.7 01/27/2013 2020   CL 101 04/17/2022 0603   CL 107 01/27/2013 2020   CO2 24 04/17/2022 0603   CO2 26 01/27/2013 2020   GLUCOSE 117 (H) 04/17/2022 0603   GLUCOSE 85 01/27/2013 2020   BUN 10 04/17/2022 0603   BUN 18 04/15/2020 1308  BUN 16 01/27/2013 2020   CREATININE 0.74 04/17/2022 0603   CREATININE 0.98 01/27/2013 2020   CALCIUM 8.0 (L) 04/17/2022 0603   CALCIUM 8.6 01/27/2013 2020   GFRNONAA >60 04/17/2022 0603   GFRNONAA >60 01/27/2013 2020   GFRAA 97 04/15/2020 1308   GFRAA >60 01/27/2013 2020    Assessment/Planning: POD #8 s/p Left Below the Knee Amputation.   Patient is status post fall on stump on 04/14/2022 and again on 04/16/2022.  Since this time he has had some serous sanguinous drainage from the incision line.  Orders have been changed to daily dressing changes while in the hospital.  Plan is to keep the patient until serosanguineous drainage stops and dressing changes can be done twice a week at home. Incision line appears to be slightly separating and the patient may need to go back to the OR next week for revision of the stump with washout.   Patient not feeling well this  morning with low grade fever and cough. Chest exray ordered to rule out post op atelectesis or pnuemonia. Blood Cultures X 2 were ordered to rule out bacteremia. Hospitalist consulted for the above issues with multiple medical issues complicating post operative care.   We are unable to obtain home health nursing and home health physical therapy for this patient due to insurance reasons.  He will then once discharged have to come to the office for dressing changes.  His wife who lives with him and helps care for him we will have to take him to physical therapy 3 times a week.  Patient was made aware of these plans.  Patient will be discharged accordingly when it is deemed appropriate to be safe at home. Cyndie Chime Truong Delcastillo  04/17/2022, 1:41 PM
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
## 2022-04-17 NOTE — Progress Notes (Signed)
PT Cancellation Note  Patient Details Name: Charles [name] MRN: [id number] DOB: 21-Mar-1961   Cancelled Treatment:     PT attempt. Pt was supine in bed with teeth chattering." I'm so cold." Pt had several blankets on him with room temp on high temp setting. Pt requested author return at a later time.    Willette Pa 04/17/2022, 11:15 AM
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
## 2022-04-18 ENCOUNTER — Other Ambulatory Visit: Payer: Self-pay | Admitting: Surgery

## 2022-04-18 MED ORDER — CEPHALEXIN 500 MG PO CAPS: 500.0000 mg | ORAL_CAPSULE | Freq: Three times a day (TID) | ORAL | 0 refills | Status: DC

## 2022-04-18 MED ORDER — OXYCODONE-ACETAMINOPHEN 5-325 MG PO TABS: 1.0000 | ORAL_TABLET | ORAL | 0 refills | Status: DC | PRN

## 2022-04-18 MED ORDER — FAMOTIDINE 20 MG PO TABS: 20.0000 mg | ORAL_TABLET | Freq: Two times a day (BID) | ORAL | Status: DC

## 2022-04-18 MED ORDER — OXYCODONE HCL 5 MG PO TABS: 5.0000 mg | ORAL_TABLET | Freq: Three times a day (TID) | ORAL | 0 refills | Status: DC | PRN

## 2022-04-18 NOTE — Progress Notes (Signed)
PHARMACIST - PHYSICIAN COMMUNICATION  CONCERNING: IV to Oral Route Change Policy  RECOMMENDATION: This patient is receiving famotidine by the intravenous route.  Based on criteria approved by the Pharmacy and Therapeutics Committee, the intravenous medication(s) is/are being converted to the equivalent oral dose form(s).   DESCRIPTION: These criteria include: The patient is eating (either orally or via tube) and/or has been taking other orally administered medications for a least 24 hours The patient has no evidence of active gastrointestinal bleeding or impaired GI absorption (gastrectomy, short bowel, patient on TNA or NPO).  If you have questions about this conversion, please contact the [place], [facility] 04/18/2022 1:04 PM

## 2022-04-18 NOTE — TOC Progression Note (Signed)
Transition of Care [facility]) - Progression Note    Patient Details  Name: Charles [name] MRN: [id number] Date of Birth: January 26, 1961  Transition of Care [facility]) CM/SW [place], LCSW Phone Number: 04/18/2022, 9:47 AM  Clinical Narrative:    CSW spoke to patient and informed him that [facility] was unable to obtain [facility] coverage. Patient states he is agreeable to OPPT and prefers [employer]. CSW is sending referral.   Expected Discharge Plan: OP Rehab Barriers to Discharge: No [place] will accept this patient  Expected Discharge Plan and Services   Discharge Planning Services: CM Consult   Living arrangements for the past 2 months: Single Family Home                 DME Arranged: Walker rolling   Date DME Agency Contacted: 04/10/22 Time DME Agency Contacted: [phone] Representative spoke with at DME Agency: Suanne Marker HH Arranged: RN, PT [place] Agency: NA Date [place]: 04/15/22 Time [place]: [phone] Representative spoke with at [place]: Unable to [place] a [place] agency due to Ins   Social Determinants of Health (SDOH) Interventions SDOH Screenings   Food Insecurity: Food Insecurity Present (12/01/2018)  Transportation Needs: Unmet Transportation Needs (12/01/2018)  Alcohol Screen: Low Risk  (05/08/2021)  Depression (PHQ2-9): Low Risk  (01/20/2022)  Financial Resource Strain: High Risk (12/01/2018)  Physical Activity: Sufficiently Active (12/01/2018)  Social Connections: Socially Integrated (12/01/2018)  Stress: Stress Concern Present (12/01/2018)  Tobacco Use: Low Risk  (04/10/2022)    Readmission Risk Interventions    04/10/2022    4:15 PM 10/22/2021    1:14 PM 08/31/2021    9:49 AM  Readmission Risk Prevention Plan  Transportation Screening Complete Complete Complete  PCP or Specialist Appt within 3-5 Days  Complete Complete  HRI or [place]   Complete  Social Work Consult for [place] Planning/Counseling  Complete Complete   Palliative Care Screening  Not Applicable Not Applicable  Medication Review Press photographer) Complete Complete Complete  PCP or Specialist appointment within 3-5 days of discharge Complete    HRI or [place] Complete    SW Recovery Care/Counseling Consult Complete    [place] Not Applicable

## 2022-04-18 NOTE — Progress Notes (Signed)
    Subjective  -   Wants to go home Did weell with PT   Physical Exam:         Assessment/Plan:    Plan for d/c home Wife to do dressing changes  Charles [name] 04/18/2022 1:52 PM --  Vitals:   04/17/22 1530 04/18/22 0753  BP: (!) 107/58 134/72  Pulse: 81 81  Resp: 16 16  Temp: 98.1 F (36.7 C) 98.1 F (36.7 C)  SpO2: 96% 99%    Intake/Output Summary (Last 24 hours) at 04/18/2022 1352 Last data filed at 04/18/2022 1106 Gross per 24 hour  Intake --  Output 1150 ml  Net -1150 ml     Laboratory CBC    Component Value Date/Time   WBC 3.9 (L) 04/17/2022 0603   HGB 7.4 (L) 04/17/2022 0603   HGB 13.1 04/15/2020 1308   HCT 23.7 (L) 04/17/2022 0603   HCT 38.7 04/15/2020 1308   PLT 328 04/17/2022 0603   PLT 216 04/15/2020 1308    BMET    Component Value Date/Time   NA 134 (L) 04/17/2022 0603   NA 138 04/15/2020 1308   NA 140 01/27/2013 2020   K 3.3 (L) 04/17/2022 0603   K 3.7 01/27/2013 2020   CL 101 04/17/2022 0603   CL 107 01/27/2013 2020   CO2 24 04/17/2022 0603   CO2 26 01/27/2013 2020   GLUCOSE 117 (H) 04/17/2022 0603   GLUCOSE 85 01/27/2013 2020   BUN 10 04/17/2022 0603   BUN 18 04/15/2020 1308   BUN 16 01/27/2013 2020   CREATININE 0.74 04/17/2022 0603   CREATININE 0.98 01/27/2013 2020   CALCIUM 8.0 (L) 04/17/2022 0603   CALCIUM 8.6 01/27/2013 2020   GFRNONAA >60 04/17/2022 0603   GFRNONAA >60 01/27/2013 2020   GFRAA 97 04/15/2020 1308   GFRAA >60 01/27/2013 2020    COAG Lab Results  Component Value Date   INR 1.1 01/21/2022   INR 1.0 01/31/2021   INR 1.1 11/13/2020   No results found for: "PTT"  Antibiotics Anti-infectives (From admission, onward)    Start     Dose/Rate Route Frequency Ordered Stop   04/09/22 1048  ceFAZolin (ANCEF) 2-4 GM/100ML-% IVPB       Note to Pharmacy: Maryagnes Amos B: cabinet override      04/09/22 1048 04/09/22 1245   04/09/22 0600  ceFAZolin (ANCEF) IVPB 2g/100 mL premix        2 g 200  mL/hr over 30 Minutes Intravenous On call to O.R. 04/09/22 0218 04/09/22 1255        V. Leia Alf, M.D., [facility] [place] Office: [phone] Pager:  [phone]
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
## 2022-04-18 NOTE — Discharge Summary (Signed)
Physician Discharge Summary  Patient ID: Charles [name] MRN: [id number] DOB/AGE: [birth year] 61 y.o.  Admit date: 04/09/2022 Discharge date: 04/18/2022  Admission Diagnoses:  Discharge Diagnoses:  Principal Problem:   Osteomyelitis of ankle and foot [facility])   Discharged Condition: good  Hospital Course: This is a 61 year old who underwent left below-knee amputation on 04/09/2022.  His recovery was complicated by several falls which caused some incisional separation.  He ultimately has been cleared by PT to go home.  His wife is able to do dressing changes.  Consults: None  Discharge Exam: Blood pressure 134/72, pulse 81, temperature 98.1 F (36.7 C), resp. rate 16, height '6\' 1"'$  (1.854 m), weight 113.4 kg, SpO2 99 %.   Disposition:    Allergies as of 04/18/2022       Reactions   Lisinopril Cough        Medication List     STOP taking these medications    amoxicillin-clavulanate 875-125 MG tablet Commonly known as: AUGMENTIN   diclofenac 50 MG tablet Commonly known as: CATAFLAM   Oxycodone HCl 10 MG Tabs   Vyvanse 40 MG capsule Generic drug: lisdexamfetamine       TAKE these medications    albuterol 108 (90 Base) MCG/ACT inhaler Commonly known as: VENTOLIN HFA Inhale 2 puffs into the lungs every 6 (six) hours as needed for wheezing or shortness of breath.   amLODipine 5 MG tablet Commonly known as: NORVASC Take 1 tablet (5 mg total) by mouth daily.   ascorbic acid 500 MG tablet Commonly known as: VITAMIN C Take 1 tablet (500 mg total) by mouth daily.   atorvastatin 40 MG tablet Commonly known as: LIPITOR Take one tab at bed time for cholesterol   buPROPion 150 MG 24 hr tablet Commonly known as: WELLBUTRIN XL TAKE 1 TABLET BY MOUTH DAILY.   carvedilol 3.125 MG tablet Commonly known as: COREG TAKE ONE (1) TABLET BY MOUTH TWO TIMES PER DAY WITH MEALS   cephALEXin 500 MG capsule Commonly known as: KEFLEX Take 1 capsule (500 mg total) by mouth  3 (three) times daily for 10 days.   DULoxetine 60 MG capsule Commonly known as: CYMBALTA TAKE 1 CAPSULE BY MOUTH DAILY.   levothyroxine 75 MCG tablet Commonly known as: SYNTHROID TAKE ONE TABLET BY MOUTH EVERY DAY WITH BREAKFAST   metaxalone 800 MG tablet Commonly known as: SKELAXIN Take 1,600 mg by mouth 3 (three) times daily.   oxyCODONE-acetaminophen 5-325 MG tablet Commonly known as: Percocet Take 1 tablet by mouth every 4 (four) hours as needed for severe pain.   sildenafil 20 MG tablet Commonly known as: REVATIO TAKE 1 TABLET BY MOUTH DAILY AS NEEDED FOR ERECTILE DYSFUNCTION   vitamin D3 25 MCG tablet Commonly known as: CHOLECALCIFEROL Take 2 tablets (2,000 Units total) by mouth daily.   zinc sulfate 220 (50 Zn) MG capsule Take 1 capsule (220 mg total) by mouth daily.   zolpidem 10 MG tablet Commonly known as: AMBIEN Take 1 tablet (10 mg total) by mouth at bedtime as needed. for sleep               Durable Medical Equipment  (From admission, onward)           Start     Ordered   04/10/22 1611  For home use only DME Walker rolling  Once       Question Answer Comment  Walker: With 5 Inch Wheels   Patient needs a walker to treat with the following  condition Impaired mobility      04/10/22 1610             Signed: Wells Khylee Algeo 04/18/2022, 2:00 PM

## 2022-04-18 NOTE — Plan of Care (Signed)
  Problem: Clinical Measurements: Goal: Diagnostic test results will improve Outcome: Progressing Goal: Respiratory complications will improve Outcome: Progressing Goal: Cardiovascular complication will be avoided Outcome: Progressing   Problem: Activity: Goal: Risk for activity intolerance will decrease Outcome: Progressing   Problem: Nutrition: Goal: Adequate nutrition will be maintained Outcome: Progressing   

## 2022-04-18 NOTE — Progress Notes (Cosign Needed Addendum)
Occupational Therapy * Physical Therapy * Speech Therapy  DATE 04/18/22 PATIENT NAME Charles [name] PATIENT MRN [id number]  DIAGNOSIS/DIAGNOSIS CODE M86.9 DATE OF DISCHARGE 04/18/22  PRIMARY CARE PHYSICIAN Drema Dallas PA PCP PHONE/FAX [phone]   Dear Provider (Name: Watson Fax: [phone]):   I certify that I have examined this patient and that occupational/physical/speech therapy is necessary on an outpatient basis.    The patient has expressed interest in completing their recommended course of therapy at your location.  Once a formal order from the patient's primary care physician has been obtained, please contact him/her to schedule an appointment for evaluation at your earliest convenience.  [ x ]  Physical Therapy Evaluate and Treat  [ x ]  Occupational Therapy Evaluate and Treat  [  ]  Speech Therapy Evaluate and Treat  The patient's primary care physician (listed above) must furnish and be responsible for a formal order such that the recommended services may be furnished while under the primary physician's care, and that the plan of care will be established and reviewed every 30 days (or more often if condition necessitates)

## 2022-04-22 ENCOUNTER — Telehealth: Payer: Self-pay | Admitting: Physician Assistant

## 2022-04-22 LAB — CULTURE, BLOOD (ROUTINE X 2)
Culture: NO GROWTH
Culture: NO GROWTH
Special Requests: ADEQUATE
Special Requests: ADEQUATE

## 2022-04-22 NOTE — Telephone Encounter (Signed)
Received PT/OT orders from [facility]. Gave to Lauren to review and sign-Toni

## 2022-04-23 ENCOUNTER — Telehealth: Payer: Self-pay | Admitting: Physician Assistant

## 2022-04-23 NOTE — Telephone Encounter (Signed)
PT/OT orders signed. Faxed back to [facility]; [phone]. Scanned-Toni

## 2022-04-24 ENCOUNTER — Telehealth (INDEPENDENT_AMBULATORY_CARE_PROVIDER_SITE_OTHER): Payer: Self-pay | Admitting: Vascular Surgery

## 2022-04-24 ENCOUNTER — Encounter (INDEPENDENT_AMBULATORY_CARE_PROVIDER_SITE_OTHER): Payer: Self-pay | Admitting: Nurse Practitioner

## 2022-04-24 NOTE — Telephone Encounter (Signed)
Patient called and wants a doctor's note for being out for 4 months due to amputation of leg that was done last Friday, sent to his job at [address] [place], [place] [address]. AttnVioleta Gelinas.  Please advise

## 2022-04-24 NOTE — Telephone Encounter (Signed)
I have placed letter on your desk

## 2022-04-27 ENCOUNTER — Telehealth (INDEPENDENT_AMBULATORY_CARE_PROVIDER_SITE_OTHER): Payer: Medicaid Other | Admitting: Physician Assistant

## 2022-04-27 ENCOUNTER — Encounter: Payer: Self-pay | Admitting: Physician Assistant

## 2022-04-27 DIAGNOSIS — Z89512 Acquired absence of left leg below knee: Secondary | ICD-10-CM

## 2022-04-27 DIAGNOSIS — F5101 Primary insomnia: Secondary | ICD-10-CM | POA: Diagnosis not present

## 2022-04-27 DIAGNOSIS — G4719 Other hypersomnia: Secondary | ICD-10-CM | POA: Diagnosis not present

## 2022-04-27 MED ORDER — LISDEXAMFETAMINE DIMESYLATE 40 MG PO CAPS: 40.0000 mg | ORAL_CAPSULE | ORAL | 0 refills | Status: DC

## 2022-04-27 MED ORDER — ZOLPIDEM TARTRATE 10 MG PO TABS: 10.0000 mg | ORAL_TABLET | Freq: Every evening | ORAL | 2 refills | Status: DC | PRN
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
## 2022-04-28 NOTE — Telephone Encounter (Signed)
Letter will be mailed

## 2022-05-04 ENCOUNTER — Other Ambulatory Visit: Payer: Self-pay

## 2022-05-04 ENCOUNTER — Inpatient Hospital Stay
Admission: EM | Admit: 2022-05-04 | Discharge: 2022-05-08 | DRG: 475 | Disposition: A | Discharge status: Home / Self Care | Payer: 59 | Attending: Internal Medicine | Admitting: Internal Medicine

## 2022-05-04 DIAGNOSIS — W1839XA Other fall on same level, initial encounter: Secondary | ICD-10-CM | POA: Diagnosis present

## 2022-05-04 DIAGNOSIS — E039 Hypothyroidism, unspecified: Secondary | ICD-10-CM | POA: Diagnosis present

## 2022-05-04 DIAGNOSIS — T8789 Other complications of amputation stump: Secondary | ICD-10-CM | POA: Diagnosis not present

## 2022-05-04 DIAGNOSIS — I252 Old myocardial infarction: Secondary | ICD-10-CM

## 2022-05-04 DIAGNOSIS — N400 Enlarged prostate without lower urinary tract symptoms: Secondary | ICD-10-CM | POA: Diagnosis present

## 2022-05-04 DIAGNOSIS — W19XXXA Unspecified fall, initial encounter: Secondary | ICD-10-CM

## 2022-05-04 DIAGNOSIS — Z79890 Hormone replacement therapy (postmenopausal): Secondary | ICD-10-CM | POA: Diagnosis not present

## 2022-05-04 DIAGNOSIS — T8781 Dehiscence of amputation stump: Principal | ICD-10-CM | POA: Diagnosis present

## 2022-05-04 DIAGNOSIS — Z89512 Acquired absence of left leg below knee: Secondary | ICD-10-CM

## 2022-05-04 DIAGNOSIS — Z833 Family history of diabetes mellitus: Secondary | ICD-10-CM

## 2022-05-04 DIAGNOSIS — Y92008 Other place in unspecified non-institutional (private) residence as the place of occurrence of the external cause: Secondary | ICD-10-CM

## 2022-05-04 DIAGNOSIS — I2510 Atherosclerotic heart disease of native coronary artery without angina pectoris: Secondary | ICD-10-CM | POA: Diagnosis present

## 2022-05-04 DIAGNOSIS — E785 Hyperlipidemia, unspecified: Secondary | ICD-10-CM | POA: Diagnosis present

## 2022-05-04 DIAGNOSIS — E1142 Type 2 diabetes mellitus with diabetic polyneuropathy: Secondary | ICD-10-CM | POA: Diagnosis present

## 2022-05-04 DIAGNOSIS — D6489 Other specified anemias: Secondary | ICD-10-CM | POA: Diagnosis present

## 2022-05-04 DIAGNOSIS — K219 Gastro-esophageal reflux disease without esophagitis: Secondary | ICD-10-CM | POA: Diagnosis present

## 2022-05-04 DIAGNOSIS — J45909 Unspecified asthma, uncomplicated: Secondary | ICD-10-CM | POA: Diagnosis present

## 2022-05-04 DIAGNOSIS — Z6834 Body mass index (BMI) 34.0-34.9, adult: Secondary | ICD-10-CM

## 2022-05-04 DIAGNOSIS — Z8616 Personal history of COVID-19: Secondary | ICD-10-CM

## 2022-05-04 DIAGNOSIS — T8754 Necrosis of amputation stump, left lower extremity: Secondary | ICD-10-CM | POA: Diagnosis not present

## 2022-05-04 DIAGNOSIS — Z955 Presence of coronary angioplasty implant and graft: Secondary | ICD-10-CM | POA: Diagnosis not present

## 2022-05-04 DIAGNOSIS — Z79899 Other long term (current) drug therapy: Secondary | ICD-10-CM

## 2022-05-04 DIAGNOSIS — T8131XA Disruption of external operation (surgical) wound, not elsewhere classified, initial encounter: Principal | ICD-10-CM

## 2022-05-04 DIAGNOSIS — I10 Essential (primary) hypertension: Secondary | ICD-10-CM | POA: Diagnosis present

## 2022-05-04 DIAGNOSIS — E669 Obesity, unspecified: Secondary | ICD-10-CM | POA: Diagnosis present

## 2022-05-04 DIAGNOSIS — E1152 Type 2 diabetes mellitus with diabetic peripheral angiopathy with gangrene: Secondary | ICD-10-CM | POA: Diagnosis present

## 2022-05-04 DIAGNOSIS — M79662 Pain in left lower leg: Secondary | ICD-10-CM | POA: Diagnosis not present

## 2022-05-04 DIAGNOSIS — T8744 Infection of amputation stump, left lower extremity: Secondary | ICD-10-CM

## 2022-05-04 DIAGNOSIS — Z9884 Bariatric surgery status: Secondary | ICD-10-CM

## 2022-05-04 DIAGNOSIS — Z9682 Presence of neurostimulator: Secondary | ICD-10-CM

## 2022-05-04 DIAGNOSIS — Z89421 Acquired absence of other right toe(s): Secondary | ICD-10-CM | POA: Diagnosis not present

## 2022-05-04 DIAGNOSIS — D649 Anemia, unspecified: Secondary | ICD-10-CM | POA: Diagnosis present

## 2022-05-04 DIAGNOSIS — E1159 Type 2 diabetes mellitus with other circulatory complications: Secondary | ICD-10-CM | POA: Diagnosis not present

## 2022-05-04 DIAGNOSIS — E119 Type 2 diabetes mellitus without complications: Secondary | ICD-10-CM

## 2022-05-04 LAB — COMPREHENSIVE METABOLIC PANEL
ALT: 12 U/L (ref 0–44)
AST: 23 U/L (ref 15–41)
Albumin: 3 g/dL — ABNORMAL LOW (ref 3.5–5.0)
Alkaline Phosphatase: 86 U/L (ref 38–126)
Anion gap: 9 (ref 5–15)
BUN: 17 mg/dL (ref 8–23)
CO2: 24 mmol/L (ref 22–32)
Calcium: 8.6 mg/dL — ABNORMAL LOW (ref 8.9–10.3)
Chloride: 104 mmol/L (ref 98–111)
Creatinine, Ser: 0.96 mg/dL (ref 0.61–1.24)
GFR, Estimated: >60 mL/min (ref >60)
Glucose, Bld: 126 mg/dL — ABNORMAL HIGH (ref 70–99)
Potassium: 4 mmol/L (ref 3.5–5.1)
Sodium: 137 mmol/L (ref 135–145)
Total Bilirubin: 0.7 mg/dL (ref 0.3–1.2)
Total Protein: 6.6 g/dL (ref 6.5–8.1)

## 2022-05-04 LAB — IRON AND TIBC
Iron: 17 ug/dL — ABNORMAL LOW (ref 45–182)
Saturation Ratios: 5 % — ABNORMAL LOW (ref 17.9–39.5)
TIBC: 365 ug/dL (ref 250–450)
UIBC: 348 ug/dL

## 2022-05-04 LAB — CBC WITH DIFFERENTIAL/PLATELET
Abs Immature Granulocytes: 0.02 10*3/uL (ref 0.00–0.07)
Basophils Absolute: 0 10*3/uL (ref 0.0–0.1)
Basophils Relative: 1 %
Eosinophils Absolute: 0.1 10*3/uL (ref 0.0–0.5)
Eosinophils Relative: 2 %
HCT: 24 % — ABNORMAL LOW (ref 39.0–52.0)
Hemoglobin: 7.1 g/dL — ABNORMAL LOW (ref 13.0–17.0)
Immature Granulocytes: 1 %
Lymphocytes Relative: 28 %
Lymphs Abs: 1.1 10*3/uL (ref 0.7–4.0)
MCH: 23.6 pg — ABNORMAL LOW (ref 26.0–34.0)
MCHC: 29.6 g/dL — ABNORMAL LOW (ref 30.0–36.0)
MCV: 79.7 fL — ABNORMAL LOW (ref 80.0–100.0)
Monocytes Absolute: 0.5 10*3/uL (ref 0.1–1.0)
Monocytes Relative: 14 %
Neutro Abs: 2.2 10*3/uL (ref 1.7–7.7)
Neutrophils Relative %: 54 %
Platelets: 412 10*3/uL — ABNORMAL HIGH (ref 150–400)
RBC: 3.01 MIL/uL — ABNORMAL LOW (ref 4.22–5.81)
RDW: 16.5 % — ABNORMAL HIGH (ref 11.5–15.5)
WBC: 3.9 10*3/uL — ABNORMAL LOW (ref 4.0–10.5)
nRBC: 0 % (ref 0.0–0.2)

## 2022-05-04 MED ORDER — ALBUTEROL SULFATE (2.5 MG/3ML) 0.083% IN NEBU: 2.5000 mg | INHALATION_SOLUTION | Freq: Four times a day (QID) | RESPIRATORY_TRACT | Status: DC | PRN

## 2022-05-04 MED ORDER — MORPHINE SULFATE (PF) 4 MG/ML IV SOLN
4.0000 mg | Freq: Once | INTRAVENOUS | Status: AC
  Administered 2022-05-04: 4 mg via INTRAVENOUS
  Filled 2022-05-04: qty 1

## 2022-05-04 MED ORDER — VANCOMYCIN HCL 2000 MG/400ML IV SOLN
2000.0000 mg | Freq: Once | INTRAVENOUS | Status: AC
  Administered 2022-05-05: 2000 mg via INTRAVENOUS
  Filled 2022-05-04: qty 400

## 2022-05-04 MED ORDER — DULOXETINE HCL 30 MG PO CPEP
60.0000 mg | ORAL_CAPSULE | Freq: Every day | ORAL | Status: DC
  Administered 2022-05-06 – 2022-05-08 (×3): 60 mg via ORAL
  Filled 2022-05-04 (×3): qty 2

## 2022-05-04 MED ORDER — METAXALONE 800 MG PO TABS
1600.0000 mg | ORAL_TABLET | Freq: Two times a day (BID) | ORAL | Status: DC
  Administered 2022-05-04 – 2022-05-08 (×7): 1600 mg via ORAL
  Filled 2022-05-04 (×9): qty 2

## 2022-05-04 MED ORDER — ACETAMINOPHEN 325 MG PO TABS
650.0000 mg | ORAL_TABLET | Freq: Four times a day (QID) | ORAL | Status: DC | PRN
  Administered 2022-05-08: 650 mg via ORAL
  Filled 2022-05-04: qty 2

## 2022-05-04 MED ORDER — BUPROPION HCL ER (XL) 150 MG PO TB24
150.0000 mg | ORAL_TABLET | Freq: Every day | ORAL | Status: DC
  Administered 2022-05-06 – 2022-05-08 (×3): 150 mg via ORAL
  Filled 2022-05-04 (×3): qty 1

## 2022-05-04 MED ORDER — HEPARIN SODIUM (PORCINE) 5000 UNIT/ML IJ SOLN
5000.0000 [IU] | Freq: Three times a day (TID) | INTRAMUSCULAR | Status: AC
  Administered 2022-05-04 – 2022-05-05 (×2): 5000 [IU] via SUBCUTANEOUS
  Filled 2022-05-04 (×2): qty 1

## 2022-05-04 MED ORDER — OXYCODONE HCL 5 MG PO TABS
15.0000 mg | ORAL_TABLET | Freq: Every day | ORAL | Status: DC
  Administered 2022-05-04 – 2022-05-08 (×16): 15 mg via ORAL
  Filled 2022-05-04 (×16): qty 3

## 2022-05-04 MED ORDER — POLYETHYLENE GLYCOL 3350 17 G PO PACK
17.0000 g | PACK | Freq: Every day | ORAL | Status: DC | PRN
  Administered 2022-05-07: 17 g via ORAL
  Filled 2022-05-04: qty 1

## 2022-05-04 MED ORDER — ONDANSETRON HCL 4 MG/2ML IJ SOLN
4.0000 mg | Freq: Four times a day (QID) | INTRAMUSCULAR | Status: DC | PRN
  Administered 2022-05-04 – 2022-05-06 (×2): 4 mg via INTRAVENOUS
  Filled 2022-05-04 (×2): qty 2

## 2022-05-04 MED ORDER — FENTANYL CITRATE PF 50 MCG/ML IJ SOSY: 50.0000 ug | PREFILLED_SYRINGE | INTRAMUSCULAR | Status: DC | PRN

## 2022-05-04 MED ORDER — SODIUM CHLORIDE 0.9 % IV SOLN
2.0000 g | INTRAVENOUS | Status: AC
  Administered 2022-05-04 – 2022-05-07 (×4): 2 g via INTRAVENOUS
  Filled 2022-05-04 (×3): qty 20
  Filled 2022-05-04: qty 2
  Filled 2022-05-04: qty 20

## 2022-05-04 MED ORDER — AMLODIPINE BESYLATE 5 MG PO TABS
5.0000 mg | ORAL_TABLET | Freq: Every day | ORAL | Status: DC
  Administered 2022-05-06 – 2022-05-08 (×3): 5 mg via ORAL
  Filled 2022-05-04 (×3): qty 1

## 2022-05-04 MED ORDER — HYDROCODONE-ACETAMINOPHEN 5-325 MG PO TABS
1.0000 | ORAL_TABLET | ORAL | Status: DC | PRN
  Administered 2022-05-04 – 2022-05-08 (×9): 2 via ORAL
  Filled 2022-05-04 (×9): qty 2

## 2022-05-04 MED ORDER — ATORVASTATIN CALCIUM 20 MG PO TABS
40.0000 mg | ORAL_TABLET | Freq: Every day | ORAL | Status: DC
  Administered 2022-05-04 – 2022-05-08 (×4): 40 mg via ORAL
  Filled 2022-05-04 (×4): qty 2

## 2022-05-04 MED ORDER — ZOLPIDEM TARTRATE 5 MG PO TABS
10.0000 mg | ORAL_TABLET | Freq: Every evening | ORAL | Status: DC | PRN
  Administered 2022-05-05: 10 mg via ORAL
  Filled 2022-05-04: qty 2

## 2022-05-04 MED ORDER — LEVOTHYROXINE SODIUM 50 MCG PO TABS
75.0000 ug | ORAL_TABLET | Freq: Every day | ORAL | Status: DC
  Administered 2022-05-05 – 2022-05-08 (×4): 75 ug via ORAL
  Filled 2022-05-04 (×4): qty 1

## 2022-05-04 MED ORDER — ALBUTEROL SULFATE HFA 108 (90 BASE) MCG/ACT IN AERS: 2.0000 | INHALATION_SPRAY | Freq: Four times a day (QID) | RESPIRATORY_TRACT | Status: DC | PRN

## 2022-05-04 MED ORDER — ACETAMINOPHEN 650 MG RE SUPP: 650.0000 mg | Freq: Four times a day (QID) | RECTAL | Status: DC | PRN

## 2022-05-04 MED ORDER — FENTANYL CITRATE PF 50 MCG/ML IJ SOSY
50.0000 ug | PREFILLED_SYRINGE | INTRAMUSCULAR | Status: DC | PRN
  Administered 2022-05-04 – 2022-05-08 (×8): 50 ug via INTRAVENOUS
  Filled 2022-05-04 (×8): qty 1

## 2022-05-04 MED ORDER — CEFAZOLIN SODIUM-DEXTROSE 1-4 GM/50ML-% IV SOLN
1.0000 g | Freq: Once | INTRAVENOUS | Status: AC
  Administered 2022-05-04: 1 g via INTRAVENOUS
  Filled 2022-05-04: qty 50

## 2022-05-04 MED ORDER — CARVEDILOL 3.125 MG PO TABS
3.1250 mg | ORAL_TABLET | Freq: Two times a day (BID) | ORAL | Status: DC
  Administered 2022-05-04 – 2022-05-08 (×7): 3.125 mg via ORAL
  Filled 2022-05-04 (×7): qty 1

## 2022-05-04 MED ORDER — VANCOMYCIN HCL 500 MG/100ML IV SOLN
500.0000 mg | Freq: Once | INTRAVENOUS | Status: AC
  Administered 2022-05-04: 500 mg via INTRAVENOUS
  Filled 2022-05-04: qty 100

## 2022-05-04 NOTE — ED Triage Notes (Signed)
Pt presents to the ED via POV due to a fall that happened yesterday. Pt recently had a L BKA. Pt states when he fell he hit his L leg and the new wound reopened. Bleeding is under control in triage at the moment. Pt A&Ox4

## 2022-05-04 NOTE — Assessment & Plan Note (Signed)
Patient is not on antiglycemic agents at home.  Last A1c within normal limits at 5.4% approximately 4 months ago.  - CBG monitoring - No negation for SSI.

## 2022-05-04 NOTE — Assessment & Plan Note (Addendum)
Patient has a history of normocytic anemia since 2020, however significant worsening postoperatively in December 2023.  Patient had significant blood loss with this wound dehiscence and injury, hemoglobin at 6.1 this morning.  Iron studies with low iron and saturation. -Ordered 3 units of PRBC as he will be going to the OR today, blood bank just approved 2 units at this time after talking with their medical director. -Monitor hemoglobin closely - Transfuse for hemoglobin less than 7 -Continue benefit from IV iron after the procedure

## 2022-05-04 NOTE — ED Provider Notes (Signed)
[facility] Provider Note   Event Date/Time   First MD Initiated Contact with Patient 05/04/22 1503     (approximate) History  Wound Check  HPI Charles [name] is a 62 y.o. male with a past medical history of hypertension, GERD, and CAD as well as recent left BKA on 04/11/2023 who presents after a mechanical fall from standing yesterday stating that he fell onto the left BKA stump caused a massive amount of bleeding.  Patient states that the pain has been 9/10 in severity since that time.  Patient states that he has been on cephalexin however finished this course of days prior to arrival.  Patient has been controlling pain at home with prescribed oxycodone. ROS: Patient currently denies any vision changes, tinnitus, difficulty speaking, facial droop, sore throat, chest pain, shortness of breath, abdominal pain, nausea/vomiting/diarrhea, dysuria, or weakness/numbness/paresthesias in any extremity   Physical Exam  Triage Vital Signs: ED Triage Vitals  Enc Vitals Group     BP 05/04/22 1334 (!) 148/90     Pulse Rate 05/04/22 1334 85     Resp 05/04/22 1334 18     Temp 05/04/22 1334 98.2 F (36.8 C)     Temp Source 05/04/22 1334 Oral     SpO2 05/04/22 1334 99 %     Weight 05/04/22 1335 264 lb 15.9 oz (120.2 kg)     Height 05/04/22 1335 '6\' 1"'$  (1.854 m)     Head Circumference --      Peak Flow --      Pain Score 05/04/22 1335 9     Pain Loc --      Pain Edu? --      Excl. in [place]? --    Most recent vital signs: Vitals:   05/04/22 1515 05/04/22 1530  BP: (!) 145/72 (!) 141/68  Pulse: 71 71  Resp:    Temp:    SpO2:     General: Awake, oriented x4. CV:  Good peripheral perfusion.  Resp:  Normal effort.  Abd:  No distention.  Other:  Middle-aged overweight Caucasian male laying in bed in mild distress secondary to pain.  The left BKA shows completely dehisced stump wound that is hemostatic and shows no signs of active purulent drainage or pulsatile  bleeding. ED Results / Procedures / Treatments  Labs (all labs ordered are listed, but only abnormal results are displayed) Labs Reviewed  CBC WITH DIFFERENTIAL/PLATELET - Abnormal; Notable for the following components:      Result Value   WBC 3.9 (*)    RBC 3.01 (*)    Hemoglobin 7.1 (*)    HCT 24.0 (*)    MCV 79.7 (*)    MCH 23.6 (*)    MCHC 29.6 (*)    RDW 16.5 (*)    Platelets 412 (*)    All other components within normal limits  COMPREHENSIVE METABOLIC PANEL - Abnormal; Notable for the following components:   Glucose, Bld 126 (*)    Calcium 8.6 (*)    Albumin 3.0 (*)    All other components within normal limits  CBC WITH DIFFERENTIAL/PLATELET  BASIC METABOLIC PANEL  IRON AND TIBC  TYPE AND SCREEN  PROCEDURES: Critical Care performed: No Procedures MEDICATIONS ORDERED IN ED: Medications  ondansetron (ZOFRAN) injection 4 mg (4 mg Intravenous Given 05/04/22 1622)  heparin injection 5,000 Units (has no administration in time range)  acetaminophen (TYLENOL) tablet 650 mg (has no administration in time range)    Or  acetaminophen (TYLENOL) suppository 650 mg (has no administration in time range)  polyethylene glycol (MIRALAX / GLYCOLAX) packet 17 g (has no administration in time range)  HYDROcodone-acetaminophen (NORCO/VICODIN) 5-325 MG per tablet 1-2 tablet (has no administration in time range)  fentaNYL (SUBLIMAZE) injection 50 mcg (has no administration in time range)  amLODipine (NORVASC) tablet 5 mg (has no administration in time range)  atorvastatin (LIPITOR) tablet 40 mg (has no administration in time range)  buPROPion (WELLBUTRIN XL) 24 hr tablet 150 mg (has no administration in time range)  carvedilol (COREG) tablet 3.125 mg (has no administration in time range)  DULoxetine (CYMBALTA) DR capsule 60 mg (has no administration in time range)  levothyroxine (SYNTHROID) tablet 75 mcg (has no administration in time range)  metaxalone (SKELAXIN) tablet 1,600 mg (has no  administration in time range)  zolpidem (AMBIEN) tablet 10 mg (has no administration in time range)  albuterol (PROVENTIL) (2.5 MG/3ML) 0.083% nebulizer solution 2.5 mg (has no administration in time range)  ceFAZolin (ANCEF) IVPB 1 g/50 mL premix (0 g Intravenous Stopped 05/04/22 1700)  morphine (PF) 4 MG/ML injection 4 mg (4 mg Intravenous Given 05/04/22 1622)   IMPRESSION / MDM / ASSESSMENT AND PLAN / ED COURSE  I reviewed the triage vital signs and the nursing notes.                             Patient's presentation is most consistent with acute presentation with potential threat to life or bodily function.  This patient presents to the ED for concern of wound dehiscence to the left BKA as well as pain after a fall, this involves an extensive number of treatment options, and is a complaint that carries with it a high risk of complications and morbidity.  The differential diagnosis includes dehiscence of left BKA wound, wound infection, head injury, concussion Co morbidities that complicate the patient evaluation  CVA, recent BKA, hypertension Additional history obtained:  Additional history obtained from EMR  External records from outside source obtained and reviewed including vascular note from recent admission during 04/11/2023 Lab Tests:  I Ordered, and personally interpreted labs.  The pertinent results include: CBC with hemoglobin of 7.1 and hematocrit of 24.  Patient did leave the hospital a few weeks ago with a hemoglobin of 7.4.  Patient is typed and crossed at this time Cardiac Monitoring: / EKG:  The patient was maintained on a cardiac monitor.  I personally viewed and interpreted the cardiac monitored which showed an underlying rhythm of: Normal sinus rhythm Consultations Obtained:  I requested consultation with the vascular surgery team, Dr. Delana Meyer,  and discussed lab and imaging findings as well as pertinent plan - they recommend: Admission to the medical service for  surgery tomorrow  Problem List / ED Course / Critical interventions / Medication management  Left BKA wound dehiscence  I ordered medication including morphine and fentanyl for analgesia as needed  Reevaluation of the patient after these medicines showed that the patient improved  I have reviewed the patients home medicines and have made adjustments as needed  Dispo: Admit to medicine with vascular surgery following 4 OR tomorrow       FINAL CLINICAL IMPRESSION(S) / ED DIAGNOSES   Final diagnoses:  Wound dehiscence, surgical, initial encounter  Fall, initial encounter   Rx / DC Orders   ED Discharge Orders     None      Note:  This document was prepared  using Systems analyst and may include unintentional dictation errors.   Naaman Plummer, MD 05/04/22 [phone]

## 2022-05-04 NOTE — Assessment & Plan Note (Signed)
-  Continue home antihypertensives 

## 2022-05-04 NOTE — Assessment & Plan Note (Addendum)
Patient presenting with a wound dehiscence at the site of BKA (performed on 04/09/2022) after a ground-level fall at home.  Prior to the fall, he was not having any difficulty with his wound.  - Vascular surgery consulted; going to the OR today for AKA - Tylenol and Norco for pain control - Per vascular surgery, appears infected, he was started on broad-spectrum antibiotic
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
## 2022-05-05 ENCOUNTER — Inpatient Hospital Stay: Payer: 59 | Admitting: Anesthesiology

## 2022-05-05 ENCOUNTER — Encounter
Admission: EM | Disposition: A | Discharge status: Home / Self Care | Payer: Self-pay | Source: Home / Self Care | Attending: Internal Medicine

## 2022-05-05 ENCOUNTER — Encounter: Payer: Self-pay | Admitting: Internal Medicine

## 2022-05-05 ENCOUNTER — Other Ambulatory Visit: Payer: Self-pay

## 2022-05-05 DIAGNOSIS — T8754 Necrosis of amputation stump, left lower extremity: Secondary | ICD-10-CM

## 2022-05-05 DIAGNOSIS — E1159 Type 2 diabetes mellitus with other circulatory complications: Secondary | ICD-10-CM | POA: Diagnosis not present

## 2022-05-05 DIAGNOSIS — T8781 Dehiscence of amputation stump: Secondary | ICD-10-CM | POA: Diagnosis not present

## 2022-05-05 DIAGNOSIS — I10 Essential (primary) hypertension: Secondary | ICD-10-CM | POA: Diagnosis not present

## 2022-05-05 DIAGNOSIS — D649 Anemia, unspecified: Secondary | ICD-10-CM | POA: Diagnosis not present

## 2022-05-05 HISTORY — PX: AMPUTATION: SHX166

## 2022-05-05 LAB — CBC WITH DIFFERENTIAL/PLATELET
Abs Immature Granulocytes: 0.01 10*3/uL (ref 0.00–0.07)
Basophils Absolute: 0 10*3/uL (ref 0.0–0.1)
Basophils Relative: 1 %
Eosinophils Absolute: 0.2 10*3/uL (ref 0.0–0.5)
Eosinophils Relative: 5 %
HCT: 21.7 % — ABNORMAL LOW (ref 39.0–52.0)
Hemoglobin: 6.1 g/dL — ABNORMAL LOW (ref 13.0–17.0)
Immature Granulocytes: 0 %
Lymphocytes Relative: 40 %
Lymphs Abs: 1.8 10*3/uL (ref 0.7–4.0)
MCH: 22.9 pg — ABNORMAL LOW (ref 26.0–34.0)
MCHC: 28.1 g/dL — ABNORMAL LOW (ref 30.0–36.0)
MCV: 81.6 fL (ref 80.0–100.0)
Monocytes Absolute: 0.6 10*3/uL (ref 0.1–1.0)
Monocytes Relative: 15 %
Neutro Abs: 1.7 10*3/uL (ref 1.7–7.7)
Neutrophils Relative %: 39 %
Platelets: 363 10*3/uL (ref 150–400)
RBC: 2.66 MIL/uL — ABNORMAL LOW (ref 4.22–5.81)
RDW: 16.4 % — ABNORMAL HIGH (ref 11.5–15.5)
WBC: 4.3 10*3/uL (ref 4.0–10.5)
nRBC: 0 % (ref 0.0–0.2)

## 2022-05-05 LAB — BASIC METABOLIC PANEL
Anion gap: 6 (ref 5–15)
BUN: 16 mg/dL (ref 8–23)
CO2: 26 mmol/L (ref 22–32)
Calcium: 8.2 mg/dL — ABNORMAL LOW (ref 8.9–10.3)
Chloride: 107 mmol/L (ref 98–111)
Creatinine, Ser: 1.01 mg/dL (ref 0.61–1.24)
GFR, Estimated: >60 mL/min (ref >60)
Glucose, Bld: 92 mg/dL (ref 70–99)
Potassium: 3.9 mmol/L (ref 3.5–5.1)
Sodium: 139 mmol/L (ref 135–145)

## 2022-05-05 LAB — PREPARE RBC (CROSSMATCH)

## 2022-05-05 SURGERY — AMPUTATION, ABOVE KNEE
Anesthesia: General | Site: Knee | Laterality: Left

## 2022-05-05 MED ORDER — SUCCINYLCHOLINE CHLORIDE 200 MG/10ML IV SOSY
PREFILLED_SYRINGE | INTRAVENOUS | Status: DC | PRN
  Administered 2022-05-05: 100 mg via INTRAVENOUS

## 2022-05-05 MED ORDER — SUGAMMADEX SODIUM 200 MG/2ML IV SOLN
INTRAVENOUS | Status: DC | PRN
  Administered 2022-05-05 (×2): 100 mg via INTRAVENOUS

## 2022-05-05 MED ORDER — LIDOCAINE HCL (PF) 2 % IJ SOLN
INTRAMUSCULAR | Status: AC
  Filled 2022-05-05: qty 5

## 2022-05-05 MED ORDER — FENTANYL CITRATE (PF) 100 MCG/2ML IJ SOLN: 50.0000 ug | Freq: Once | INTRAMUSCULAR | Status: AC

## 2022-05-05 MED ORDER — DEXMEDETOMIDINE HCL IN NACL 80 MCG/20ML IV SOLN
INTRAVENOUS | Status: DC | PRN
  Administered 2022-05-05 (×2): 4 ug via INTRAVENOUS

## 2022-05-05 MED ORDER — PROPOFOL 10 MG/ML IV BOLUS
INTRAVENOUS | Status: AC
  Filled 2022-05-05: qty 20

## 2022-05-05 MED ORDER — SODIUM CHLORIDE 0.9 % IV SOLN
300.0000 mg | Freq: Once | INTRAVENOUS | Status: AC
  Administered 2022-05-06: 300 mg via INTRAVENOUS
  Filled 2022-05-05: qty 300

## 2022-05-05 MED ORDER — SODIUM CHLORIDE 0.9 % IV SOLN: INTRAVENOUS | Status: DC | PRN

## 2022-05-05 MED ORDER — SUCCINYLCHOLINE CHLORIDE 200 MG/10ML IV SOSY
PREFILLED_SYRINGE | INTRAVENOUS | Status: AC
  Filled 2022-05-05: qty 10

## 2022-05-05 MED ORDER — LIDOCAINE HCL (CARDIAC) PF 100 MG/5ML IV SOSY
PREFILLED_SYRINGE | INTRAVENOUS | Status: DC | PRN
  Administered 2022-05-05 (×2): 50 mg via INTRAVENOUS

## 2022-05-05 MED ORDER — BUPIVACAINE LIPOSOME 1.3 % IJ SUSP
INTRAMUSCULAR | Status: AC
  Filled 2022-05-05: qty 20

## 2022-05-05 MED ORDER — OXYCODONE HCL 5 MG PO TABS
15.0000 mg | ORAL_TABLET | Freq: Once | ORAL | Status: AC
  Administered 2022-05-05: 15 mg via ORAL

## 2022-05-05 MED ORDER — SODIUM CHLORIDE 0.9% IV SOLUTION: Freq: Once | INTRAVENOUS | Status: AC

## 2022-05-05 MED ORDER — KETAMINE HCL 50 MG/5ML IJ SOSY
PREFILLED_SYRINGE | INTRAMUSCULAR | Status: AC
  Filled 2022-05-05: qty 5

## 2022-05-05 MED ORDER — PROPOFOL 10 MG/ML IV BOLUS
INTRAVENOUS | Status: DC | PRN
  Administered 2022-05-05: 100 mg via INTRAVENOUS
  Administered 2022-05-05 (×2): 50 mg via INTRAVENOUS

## 2022-05-05 MED ORDER — VANCOMYCIN HCL 1250 MG/250ML IV SOLN
1250.0000 mg | Freq: Two times a day (BID) | INTRAVENOUS | Status: AC
  Administered 2022-05-05 – 2022-05-07 (×6): 1250 mg via INTRAVENOUS
  Filled 2022-05-05 (×6): qty 250

## 2022-05-05 MED ORDER — BUPIVACAINE LIPOSOME 1.3 % IJ SUSP
INTRAMUSCULAR | Status: DC | PRN
  Administered 2022-05-05: 50 mL

## 2022-05-05 MED ORDER — OXYCODONE HCL 5 MG PO TABS
ORAL_TABLET | ORAL | Status: AC
  Filled 2022-05-05: qty 3

## 2022-05-05 MED ORDER — FENTANYL CITRATE (PF) 100 MCG/2ML IJ SOLN
INTRAMUSCULAR | Status: AC
  Administered 2022-05-05: 50 ug via INTRAVENOUS
  Filled 2022-05-05: qty 2

## 2022-05-05 MED ORDER — FENTANYL CITRATE (PF) 100 MCG/2ML IJ SOLN
INTRAMUSCULAR | Status: AC
  Administered 2022-05-05: 25 ug via INTRAVENOUS
  Filled 2022-05-05: qty 2

## 2022-05-05 MED ORDER — HYDROMORPHONE HCL 1 MG/ML IJ SOLN
INTRAMUSCULAR | Status: AC
  Administered 2022-05-05: 0.5 mg via INTRAVENOUS
  Filled 2022-05-05: qty 1

## 2022-05-05 MED ORDER — ROCURONIUM BROMIDE 100 MG/10ML IV SOLN
INTRAVENOUS | Status: DC | PRN
  Administered 2022-05-05: 15 mg via INTRAVENOUS
  Administered 2022-05-05: 10 mg via INTRAVENOUS
  Administered 2022-05-05: 70 mg via INTRAVENOUS

## 2022-05-05 MED ORDER — MIDAZOLAM HCL 2 MG/2ML IJ SOLN
INTRAMUSCULAR | Status: AC
  Filled 2022-05-05: qty 2

## 2022-05-05 MED ORDER — FENTANYL CITRATE (PF) 100 MCG/2ML IJ SOLN
25.0000 ug | INTRAMUSCULAR | Status: DC | PRN
  Administered 2022-05-05: 25 ug via INTRAVENOUS
  Administered 2022-05-05: 50 ug via INTRAVENOUS
  Administered 2022-05-05 (×2): 25 ug via INTRAVENOUS

## 2022-05-05 MED ORDER — HYDROMORPHONE HCL 1 MG/ML IJ SOLN
1.0000 mg | INTRAMUSCULAR | Status: DC | PRN
  Administered 2022-05-05 – 2022-05-07 (×3): 1 mg via INTRAVENOUS
  Filled 2022-05-05 (×3): qty 1

## 2022-05-05 MED ORDER — HYDROMORPHONE HCL 1 MG/ML IJ SOLN
INTRAMUSCULAR | Status: DC | PRN
  Administered 2022-05-05 (×2): .5 mg via INTRAVENOUS

## 2022-05-05 MED ORDER — SODIUM CHLORIDE (PF) 0.9 % IJ SOLN
INTRAMUSCULAR | Status: AC
  Filled 2022-05-05: qty 50

## 2022-05-05 MED ORDER — FENTANYL CITRATE (PF) 100 MCG/2ML IJ SOLN
INTRAMUSCULAR | Status: AC
  Filled 2022-05-05: qty 2

## 2022-05-05 MED ORDER — GLYCOPYRROLATE 0.2 MG/ML IJ SOLN
INTRAMUSCULAR | Status: AC
  Filled 2022-05-05: qty 1

## 2022-05-05 MED ORDER — DEXAMETHASONE SODIUM PHOSPHATE 10 MG/ML IJ SOLN
INTRAMUSCULAR | Status: AC
  Filled 2022-05-05: qty 1

## 2022-05-05 MED ORDER — PHENYLEPHRINE HCL-NACL 20-0.9 MG/250ML-% IV SOLN
INTRAVENOUS | Status: AC
  Filled 2022-05-05: qty 250

## 2022-05-05 MED ORDER — KETAMINE HCL 50 MG/ML IJ SOLN
INTRAMUSCULAR | Status: DC | PRN
  Administered 2022-05-05 (×3): 10 mg via INTRAVENOUS

## 2022-05-05 MED ORDER — PROMETHAZINE HCL 25 MG/ML IJ SOLN: 6.2500 mg | INTRAMUSCULAR | Status: DC | PRN

## 2022-05-05 MED ORDER — FENTANYL CITRATE (PF) 100 MCG/2ML IJ SOLN
INTRAMUSCULAR | Status: DC | PRN
  Administered 2022-05-05 (×4): 50 ug via INTRAVENOUS

## 2022-05-05 MED ORDER — MIDAZOLAM HCL 2 MG/2ML IJ SOLN
INTRAMUSCULAR | Status: DC | PRN
  Administered 2022-05-05 (×2): 1 mg via INTRAVENOUS

## 2022-05-05 MED ORDER — BUPIVACAINE HCL (PF) 0.5 % IJ SOLN
INTRAMUSCULAR | Status: AC
  Filled 2022-05-05: qty 30

## 2022-05-05 MED ORDER — DEXAMETHASONE SODIUM PHOSPHATE 10 MG/ML IJ SOLN
INTRAMUSCULAR | Status: DC | PRN
  Administered 2022-05-05: 4 mg via INTRAVENOUS

## 2022-05-05 MED ORDER — 0.9 % SODIUM CHLORIDE (POUR BTL) OPTIME
TOPICAL | Status: DC | PRN
  Administered 2022-05-05: 2000 mL

## 2022-05-05 MED ORDER — PHENYLEPHRINE HCL (PRESSORS) 10 MG/ML IV SOLN
INTRAVENOUS | Status: DC | PRN
  Administered 2022-05-05 (×2): 40 ug via INTRAVENOUS

## 2022-05-05 MED ORDER — HYDROMORPHONE HCL 1 MG/ML IJ SOLN
INTRAMUSCULAR | Status: AC
  Filled 2022-05-05: qty 1

## 2022-05-05 MED ORDER — ONDANSETRON HCL 4 MG/2ML IJ SOLN
INTRAMUSCULAR | Status: AC
  Filled 2022-05-05: qty 2

## 2022-05-05 MED ORDER — LACTATED RINGERS IV SOLN: INTRAVENOUS | Status: DC | PRN

## 2022-05-05 MED ORDER — ONDANSETRON HCL 4 MG/2ML IJ SOLN
INTRAMUSCULAR | Status: DC | PRN
  Administered 2022-05-05: 4 mg via INTRAVENOUS

## 2022-05-05 MED ORDER — HYDROMORPHONE HCL 1 MG/ML IJ SOLN
0.5000 mg | INTRAMUSCULAR | Status: AC | PRN
  Administered 2022-05-05 (×2): 0.5 mg via INTRAVENOUS

## 2022-05-05 SURGICAL SUPPLY — 46 items
BLADE SAW SAG 25.4X90 (BLADE) ×1 IMPLANT
BLADE SURG SZ10 CARB STEEL (BLADE) ×1 IMPLANT
BNDG COHESIVE 4X5 TAN STRL LF (GAUZE/BANDAGES/DRESSINGS) ×1 IMPLANT
BNDG ELASTIC 6X5.8 VLCR NS LF (GAUZE/BANDAGES/DRESSINGS) ×1 IMPLANT
BNDG GAUZE DERMACEA FLUFF 4 (GAUZE/BANDAGES/DRESSINGS) ×1 IMPLANT
DERMABOND ADVANCED .7 DNX12 (GAUZE/BANDAGES/DRESSINGS) ×1 IMPLANT
DRAPE INCISE IOBAN 66X45 STRL (DRAPES) ×1 IMPLANT
DRSG GAUZE FLUFF 36X18 (GAUZE/BANDAGES/DRESSINGS) ×1 IMPLANT
ELECT CAUTERY BLADE 6.4 (BLADE) ×1 IMPLANT
ELECT REM PT RETURN 9FT ADLT (ELECTROSURGICAL) ×1
ELECTRODE REM PT RTRN 9FT ADLT (ELECTROSURGICAL) ×1 IMPLANT
GLOVE BIO SURGEON STRL SZ7 (GLOVE) ×1 IMPLANT
GLOVE SURG SYN 8.0 (GLOVE) ×1 IMPLANT
GLOVE SURG SYN 8.0 PF PI (GLOVE) ×1 IMPLANT
GOWN STRL REUS W/ TWL LRG LVL3 (GOWN DISPOSABLE) ×2 IMPLANT
GOWN STRL REUS W/ TWL XL LVL3 (GOWN DISPOSABLE) ×1 IMPLANT
GOWN STRL REUS W/TWL LRG LVL3 (GOWN DISPOSABLE) ×1
GOWN STRL REUS W/TWL XL LVL3 (GOWN DISPOSABLE) ×2
HANDLE YANKAUER SUCT BULB TIP (MISCELLANEOUS) ×1 IMPLANT
KIT TURNOVER KIT A (KITS) ×1 IMPLANT
MANIFOLD NEPTUNE II (INSTRUMENTS) ×1 IMPLANT
MAT ABSORB  FLUID 56X50 GRAY (MISCELLANEOUS) ×1
MAT ABSORB FLUID 56X50 GRAY (MISCELLANEOUS) ×1 IMPLANT
NDL HYPO 21X1.5 SAFETY (NEEDLE) ×1 IMPLANT
NEEDLE HYPO 21X1.5 SAFETY (NEEDLE) ×1 IMPLANT
NS IRRIG 500ML POUR BTL (IV SOLUTION) ×1 IMPLANT
PACK EXTREMITY ARMC (MISCELLANEOUS) ×1 IMPLANT
PAD ABD DERMACEA PRESS 5X9 (GAUZE/BANDAGES/DRESSINGS) IMPLANT
PAD PREP 24X41 OB/GYN DISP (PERSONAL CARE ITEMS) ×1 IMPLANT
SPONGE T-LAP 18X18 ~~LOC~~+RFID (SPONGE) ×2 IMPLANT
STOCKINETTE M/LG 89821 (MISCELLANEOUS) ×1 IMPLANT
SUT ETHIBOND 0 36 GRN (SUTURE) IMPLANT
SUT MNCRL 4-0 (SUTURE) ×1
SUT MNCRL 4-0 27XMFL (SUTURE) ×1
SUT SILK 3 0 (SUTURE) ×1
SUT SILK 3-0 18XBRD TIE 12 (SUTURE) ×1 IMPLANT
SUT VIC AB 0 CT1 36 (SUTURE) ×6 IMPLANT
SUT VIC AB 3-0 SH 27 (SUTURE) ×5
SUT VIC AB 3-0 SH 27X BRD (SUTURE) ×2 IMPLANT
SUT VICRYL PLUS ABS 0 54 (SUTURE) ×1 IMPLANT
SUTURE MNCRL 4-0 27XMF (SUTURE) ×1 IMPLANT
SYR 20ML LL LF (SYRINGE) ×2 IMPLANT
SYR 30ML LL (SYRINGE) IMPLANT
TAPE UMBILICAL 1/8 X36 TWILL (MISCELLANEOUS) ×1 IMPLANT
TRAP FLUID SMOKE EVACUATOR (MISCELLANEOUS) ×1 IMPLANT
WATER STERILE IRR 500ML POUR (IV SOLUTION) ×1 IMPLANT

## 2022-05-05 NOTE — Progress Notes (Signed)
Pharmacy Antibiotic Note  Charles [name] is a 62 y.o. male admitted on 05/04/2022 with  wound infection .  Pharmacy has been consulted for Vancomycin dosing.  Plan: Vancomycin 500 mg IV X 1 given on 1/15 @ 2301. Additional Vanc 2 gm ordered to make total loading dose of 2500 mg.   Vancomycin 1250 mg IV Q12H ordered to start on 1/16 @ 1100.  AUC = 518.7 Vanc trough = 14. 5  Height: '6\' 1"'$  (185.4 cm) Weight: 120.2 kg (264 lb 15.9 oz) IBW/kg (Calculated) : 79.9  Temp (24hrs), Avg:98 F (36.7 C), Min:97.9 F (36.6 C), Max:98.2 F (36.8 C)  Recent Labs  Lab 05/04/22 1346  WBC 3.9*  CREATININE 0.96    Estimated Creatinine Clearance: 109.7 mL/min (by C-G formula based on SCr of 0.96 mg/dL).    Allergies  Allergen Reactions   Lisinopril Cough    Antimicrobials this admission:   >>    >>   Dose adjustments this admission:   Microbiology results:  BCx:   UCx:    Sputum:    MRSA PCR:   Thank you for allowing pharmacy to be a part of this patient's care.  Von In[place] D 05/05/2022 12:21 AM

## 2022-05-05 NOTE — Anesthesia Procedure Notes (Signed)
Procedure Name: Intubation Date/Time: 05/05/2022 2:43 PM  Performed by: Adalberto Ill, CRNAPre-anesthesia Checklist: Patient identified, Emergency Drugs available, Suction available, Patient being monitored and Timeout performed Patient Re-evaluated:Patient Re-evaluated prior to induction Oxygen Delivery Method: Circle system utilized Preoxygenation: Pre-oxygenation with 100% oxygen Induction Type: IV induction Ventilation: Mask ventilation without difficulty Laryngoscope Size: Miller and 2 Grade View: Grade I Tube type: Oral Tube size: 7.5 mm Number of attempts: 1 Airway Equipment and Method: Stylet Placement Confirmation: ETT inserted through vocal cords under direct vision, positive ETCO2 and breath sounds checked- equal and bilateral Secured at: 22 cm Tube secured with: Tape Dental Injury: Teeth and Oropharynx as per pre-operative assessment  Comments: Brief atraumatic dentition unchanged

## 2022-05-05 NOTE — Hospital Course (Addendum)
Taken from H&P.   Charles [name] is a 62 y.o. male with medical history significant of CAD s/p PCI to LAD, type 2 diabetes, hypothyroidism, hypertension, hyperlipidemia, left foot osteomyelitis s/p BKA (04/09/2022) who is presenting due to a wound injury.   Charles [name] states that yesterday he was climbing up the steps at his home, with the assistance of his family.  he lost his balance and ultimately fell to the ground, resulted in dehiscence of stump and significant blood loss through it.  Open stamp wound picture in H&P  ED course.  On arrival vitals were stable, labs pertinent for WBC of 3.9, hemoglobin of 7.1, platelets 412, albumin 3.  Wound look infected with exposed bone. Due to wound dehiscence, vascular surgery was consulted and they are planning to take him to the OR on 1/16 a.m. for AKA.  1/16: Still stable.  Hemoglobin decreased to 6.1.  Ordered 3 unit of PRBC as patient will be going to the OR for AKA today.

## 2022-05-05 NOTE — Anesthesia Preprocedure Evaluation (Signed)
Anesthesia Evaluation  Patient identified by MRN, date of birth, ID band Patient awake    Reviewed: Allergy & Precautions, H&P , NPO status , Patient's Chart, lab work & pertinent test results, reviewed documented beta blocker date and time   History of Anesthesia Complications Negative for: history of anesthetic complications  Airway Mallampati: I  TM Distance: >3 FB Neck ROM: full    Dental  (+) Edentulous Upper, Edentulous Lower   Pulmonary shortness of breath and with exertion, asthma , sleep apnea , neg COPD, neg recent URI   Pulmonary exam normal        Cardiovascular Exercise Tolerance: Poor hypertension, On Medications (-) angina + CAD, + Past MI, + Cardiac Stents and + Peripheral Vascular Disease  (-) CABG Normal cardiovascular exam(-) dysrhythmias (-) Valvular Problems/Murmurs Rate:Normal  LHC 06/27/2015: 90% pLAD; PCI performed placing 3.5 x 18 mm Xience Alpine DES x 1. b.) LHC 06/25/2016: EF 55%; no obstructive CAD; patent stent to LAD.   Neuro/Psych neg Seizures PSYCHIATRIC DISORDERS Anxiety Depression     Neuromuscular disease    GI/Hepatic Neg liver ROS, PUD,neg GERD  ,,  Endo/Other  diabetesHypothyroidism    Renal/GU Renal disease  negative genitourinary   Musculoskeletal  (+) Arthritis ,    Abdominal  (+) + obese  Peds  Hematology  (+) Blood dyscrasia, anemia   Anesthesia Other Findings Past Medical History: No date: ADD (attention deficit disorder)     Comment:  a.) takes lisdexamfetamine No date: Anginal pain ([place]) No date: Anxiety No date: Arthritis No date: Asthma No date: BPH (benign prostatic hyperplasia) No date: Chronic back pain No date: Chronic, continuous use of opioids No date: Colon polyps 06/27/2015: Coronary artery disease     Comment:  a.) LHC 06/27/2015: 90% pLAD; PCI performed placing 3.5               x 18 mm Xience Alpine DES x 1. b.) LHC 06/25/2016: EF               55%;  no obstructive CAD; patent stent to LAD. No date: Depression No date: Erectile dysfunction     Comment:  a.) on PDE5i (sildenafil) No date: GERD (gastroesophageal reflux disease) No date: Gout 04/2020: History of 2019 novel coronavirus disease (COVID-19) No date: History of cocaine abuse (HCC) No date: History of kidney stones No date: History of Roux-en-Y gastric bypass No date: HLD (hyperlipidemia) No date: Hypertension No date: Hypogonadism in male No date: Hypothyroidism No date: Insomnia     Comment:  a.) on hypnotic (zolpidem) PRN 2017: Myocardial infarction ([place]) No date: Neuropathy of both feet No date: Osteomyelitis of left foot (HCC) No date: PAD (peripheral artery disease) (HCC) No date: Peptic ulcer No date: Pneumonia No date: Shortness of breath No date: Sleep apnea     Comment:  a.) no longer requires nocturnal PAP therapy following               140 lb weight loss s/p RNY bypass No date: Status post insertion of spinal cord stimulator No date: Type 2 diabetes mellitus with polyneuropathy (HCC) No date: Wears dentures     Comment:  partial upper No date: Wears glasses   Reproductive/Obstetrics negative OB ROS                             Anesthesia Physical Anesthesia Plan  ASA: 3  Anesthesia Plan: General   Post-op Pain Management:  Induction: Intravenous  PONV Risk Score and Plan: 3 and Dexamethasone, Ondansetron and Treatment may vary due to age or medical condition  Airway Management Planned: LMA and Oral ETT  Additional Equipment:   Intra-op Plan:   Post-operative Plan: Extubation in OR  Informed Consent: I have reviewed the patients History and Physical, chart, labs and discussed the procedure including the risks, benefits and alternatives for the proposed anesthesia with the patient or authorized representative who has indicated his/her understanding and acceptance.     Dental advisory given  Plan Discussed  with: CRNA  Anesthesia Plan Comments:         Anesthesia Quick Evaluation

## 2022-05-05 NOTE — Anesthesia Postprocedure Evaluation (Signed)
Anesthesia Post Note  Patient: Charles [name]  Procedure(s) Performed: AMPUTATION ABOVE KNEE (Left: Knee)  Patient location during evaluation: PACU Anesthesia Type: General Level of consciousness: awake and alert Pain management: pain level controlled Vital Signs Assessment: post-procedure vital signs reviewed and stable Respiratory status: spontaneous breathing, nonlabored ventilation, respiratory function stable and patient connected to nasal cannula oxygen Cardiovascular status: blood pressure returned to baseline and stable Postop Assessment: no apparent nausea or vomiting Anesthetic complications: no   No notable events documented.   Last Vitals:  Vitals:   05/05/22 1813 05/05/22 1832  BP: (!) 161/89 (!) 163/90  Pulse: 77 77  Resp: 13   Temp:    SpO2: 97% 98%    Last Pain:  Vitals:   05/05/22 1813  TempSrc:   PainSc: 5                  Arita Miss

## 2022-05-05 NOTE — TOC Transition Note (Deleted)
Transition of Care [facility]) - CM/SW Discharge Note   Patient Details  Name: Charles [name] MRN: [id number] Date of Birth: 1960/09/22  Transition of Care [facility]) CM/SW Contact:  Quin Hoop, LCSW Phone Number: 05/05/2022, 11:19 AM   Clinical Narrative:    Readmit assessment completed with patient via phone.  Patient states that his PC P is Dr. Chauncey Cruel.  He uses a transport service to get to and from his appointments.  Patient uses [facility] in [place] to fill his prescriptions.  He has no concerns with paying for medications.  Patient has a walker at home, but states that he needs a wheelchair.  Patient waiting to be seen by PT/OT for recommendations for next level of care.  TOC to continue to follow for needs.      Barriers to Discharge: Continued Medical Work up   Patient Goals and CMS Choice      Discharge Placement                         Discharge Plan and Services Additional resources added to the After Visit Summary for                                       Social Determinants of Health (SDOH) Interventions SDOH Screenings   Food Insecurity: Food Insecurity Present (05/04/2022)  Transportation Needs: Unmet Transportation Needs (05/04/2022)  Alcohol Screen: Low Risk  (05/08/2021)  Depression (PHQ2-9): Low Risk  (01/20/2022)  Financial Resource Strain: High Risk (12/01/2018)  Physical Activity: Sufficiently Active (12/01/2018)  Social Connections: Socially Integrated (12/01/2018)  Stress: Stress Concern Present (12/01/2018)  Tobacco Use: Low Risk  (05/04/2022)     Readmission Risk Interventions    04/10/2022    4:15 PM 10/22/2021    1:14 PM 08/31/2021    9:49 AM  Readmission Risk Prevention Plan  Transportation Screening Complete Complete Complete  PCP or Specialist Appt within 3-5 Days  Complete Complete  HRI or [place]   Complete  Social Work Consult for [place] Planning/Counseling  Complete Complete  Palliative Care  Screening  Not Applicable Not Applicable  Medication Review Press photographer) Complete Complete Complete  PCP or Specialist appointment within 3-5 days of discharge Complete    HRI or [place] Complete    SW Recovery Care/Counseling Consult Complete    [place] Not Applicable

## 2022-05-05 NOTE — Progress Notes (Signed)
I have attempted to call the patient's wife Charles [name].  Unfortunately the there was no answer and the voicemail has not been set up.

## 2022-05-05 NOTE — Progress Notes (Signed)
Progress Note   Patient: Charles [name] TIR:[id number] DOB: 02-13-1961 DOA: 05/04/2022     1 DOS: the patient was seen and examined on 05/05/2022   Brief hospital course: Taken from H&P.   Charles [name] is a 62 y.o. male with medical history significant of CAD s/p PCI to LAD, type 2 diabetes, hypothyroidism, hypertension, hyperlipidemia, left foot osteomyelitis s/p BKA (04/09/2022) who is presenting due to a wound injury.   Charles [name] states that yesterday he was climbing up the steps at his home, with the assistance of his family.  he lost his balance and ultimately fell to the ground, resulted in dehiscence of stump and significant blood loss through it.  Open stamp wound picture in H&P  ED course.  On arrival vitals were stable, labs pertinent for WBC of 3.9, hemoglobin of 7.1, platelets 412, albumin 3.  Wound look infected with exposed bone. Due to wound dehiscence, vascular surgery was consulted and they are planning to take him to the OR on 1/16 a.m. for AKA.  1/16: Still stable.  Hemoglobin decreased to 6.1.  Ordered 3 unit of PRBC as patient will be going to the OR for AKA today.    Assessment and Plan: * Dehiscence of amputation stump [facility]) Patient presenting with a wound dehiscence at the site of BKA (performed on 04/09/2022) after a ground-level fall at home.  Prior to the fall, he was not having any difficulty with his wound.  - Vascular surgery consulted; going to the OR today for AKA - Tylenol and Norco for pain control - Per vascular surgery, appears infected, he was started on broad-spectrum antibiotic  Essential hypertension - Continue home antihypertensives  Type II diabetes mellitus ([place]) Patient is not on antiglycemic agents at home.  Last A1c within normal limits at 5.4% approximately 4 months ago.  - CBG monitoring - Can add SSI if needed  Normocytic anemia Patient has a history of normocytic anemia since 2020, however significant worsening  postoperatively in December 2023.  Patient had significant blood loss with this wound dehiscence and injury, hemoglobin at 6.1 this morning.  Iron studies with low iron and saturation. -Ordered 3 units of PRBC as he will be going to the OR today, blood bank just approved 2 units at this time after talking with their medical director. -Monitor hemoglobin closely - Transfuse for hemoglobin less than 7 -Continue benefit from IV iron after the procedure        Subjective: Patient was seen and examined today.  Having significant pain in the left stump, requiring fentanyl every 2 hours.  Physical Exam: Vitals:   05/05/22 1014 05/05/22 1025 05/05/22 1254 05/05/22 1359  BP: (!) 142/72 133/72 133/84 130/82  Pulse: 75 76 77 73  Resp: '16 16 15 18  '$ Temp: 98.3 F (36.8 C) 98.4 F (36.9 C) 98.2 F (36.8 C) 98.6 F (37 C)  TempSrc:  Oral  Oral  SpO2: 93% 95% 97% 93%  Weight:      Height:       General.  Obese gentleman, in no acute distress. Pulmonary.  Lungs clear bilaterally, normal respiratory effort. CV.  Regular rate and rhythm, no JVD, rub or murmur. Abdomen.  Soft, nontender, nondistended, BS positive. CNS.  Alert and oriented .  No focal neurologic deficit. Extremities.  Left BKA stump with Ace wrap, no edema on right. Psychiatry.  Judgment and insight appears normal.   Data Reviewed: Prior data reviewed.  Family Communication: Discussed with patient  Disposition: Status  is: Inpatient Remains inpatient appropriate because: Severity of illness  Planned Discharge Destination: Skilled nursing facility  Time spent: 50 minutes  This record has been created using Systems analyst. Errors have been sought and corrected,but may not always be located. Such creation errors do not reflect on the standard of care.   Author: Lorella Nimrod, MD 05/05/2022 2:46 PM  For on call review [email or web address].

## 2022-05-05 NOTE — Transfer of Care (Signed)
Immediate Anesthesia Transfer of Care Note  Patient: Charles [name]  Procedure(s) Performed: AMPUTATION ABOVE KNEE (Left: Knee)  Patient Location: PACU  Anesthesia Type:General  Level of Consciousness: awake, alert , oriented, and patient cooperative  Airway & Oxygen Therapy: Patient Spontanous Breathing and Patient connected to face mask oxygen  Post-op Assessment: Report given to RN and Post -op Vital signs reviewed and stable  Post vital signs: Reviewed and stable  Last Vitals:  Vitals Value Taken Time  BP 100/77 05/05/22 1639  Temp 37.6 C 05/05/22 1639  Pulse 86 05/05/22 1645  Resp 15 05/05/22 1645  SpO2 97 % 05/05/22 1645  Vitals shown include unvalidated device data.  Last Pain:  Vitals:   05/05/22 1359  TempSrc: Oral  PainSc: 9          Complications: No notable events documented.

## 2022-05-05 NOTE — Op Note (Signed)
  [facility]   OPERATIVE NOTE   PROCEDURE:  Left above-the-knee amputation  PRE-OPERATIVE DIAGNOSIS: Gangrene left below-knee amputation stump  POST-OPERATIVE DIAGNOSIS: same as above  SURGEON: Katha Cabal, MD  ASSISTANT(S): Annalee Genta, NP  ANESTHESIA: general  ESTIMATED BLOOD LOSS: 100 cc  FINDING(S): Viable muscle bellies at the thigh level  SPECIMEN(S):  Left above-the-knee amputation  INDICATIONS:   Charles [name] is a 63 y.o. male who presents with left below-knee amputation stump gangrene.  The patient is scheduled for a left above-the-knee amputation.  I discussed in depth with the patient the risks, benefits, and alternatives to this procedure.  The patient is aware that the risk of this operation included but are not limited to:  bleeding, infection, myocardial infarction, stroke, death, failure to heal amputation wound, and possible need for more proximal amputation.  The patient is aware of the risks and agrees proceed forward with the procedure.  DESCRIPTION: After full informed written consent was obtained from the patient, the patient was taken to the operating room, and placed supine upon the operating table.  Prior to induction, the patient received IV antibiotics.  The patient was then prepped and draped in the standard fashion for a left above-the-knee amputation.  After obtaining adequate anesthesia, the patient was prepped and draped in the standard fashion for a above-the-knee amputation.  I marked out the anterior and posterior flaps for a fish-mouth type of amputation.  I made the incisions for these flaps, and then dissected through the subcutaneous tissue, fascia, and muscles circumferentially.  I elevated  the periosteal tissue 4-5 cm more proximal than the anterior skin flap.  I then transected the femur with a power saw at this level.  Then I smoothed out the rough edges of the bone.  At this point, the specimen was passed off  the field as the above-the-knee amputation.  At this point, I clamped all visibly bleeding arteries and veins using a combination of suture ligation with silk suture and electrocautery.   Bleeding continued to be controlled with electrocautery and suture ligature.  The stump was washed off with sterile normal saline and no further active bleeding was noted.  I reapproximated the anterior and posterior fascia  with interrupted stitches of 0 Vicryl.  This was completed along the entire length of anterior and posterior fascia until there were no more loose space in the fascial line. The subcutaneous tissue was then approximated with 2-0 vicryl sutures. The skin was then  reapproximated with staples.  The stump was washed off and dried.  The incision was dressed with Xeroform and ABD pads, and  then fluffs were applied.  Kerlix was wrapped around the leg and then gently an ACE wrap was applied.  A large Ioban was then placed over the ACE wrap to secure the dressing. The patient was then awakened and take to the recovery room in stable condition.   COMPLICATIONS: none  CONDITION: stable  Hortencia Pilar  05/05/2022, 4:27 PM   This note was created with Dragon Medical transcription system. Any errors in dictation are purely unintentional.

## 2022-05-06 ENCOUNTER — Encounter: Payer: Self-pay | Admitting: Vascular Surgery

## 2022-05-06 DIAGNOSIS — T8781 Dehiscence of amputation stump: Secondary | ICD-10-CM | POA: Diagnosis not present

## 2022-05-06 LAB — TYPE AND SCREEN
ABO/RH(D): O NEG
Antibody Screen: NEGATIVE
Unit division: 0
Unit division: 0
Unit division: 0

## 2022-05-06 LAB — BPAM RBC
Blood Product Expiration Date: 202402232359
Blood Product Expiration Date: 202402232359
Blood Product Expiration Date: 202402232359
ISSUE DATE / TIME: 202401161004
ISSUE DATE / TIME: 202401161253
ISSUE DATE / TIME: 202401161508
Unit Type and Rh: 5100
Unit Type and Rh: 5100
Unit Type and Rh: 5100

## 2022-05-06 LAB — CBC
HCT: 26.6 % — ABNORMAL LOW (ref 39.0–52.0)
Hemoglobin: 7.9 g/dL — ABNORMAL LOW (ref 13.0–17.0)
MCH: 24.2 pg — ABNORMAL LOW (ref 26.0–34.0)
MCHC: 29.7 g/dL — ABNORMAL LOW (ref 30.0–36.0)
MCV: 81.6 fL (ref 80.0–100.0)
Platelets: 381 10*3/uL (ref 150–400)
RBC: 3.26 MIL/uL — ABNORMAL LOW (ref 4.22–5.81)
RDW: 16.5 % — ABNORMAL HIGH (ref 11.5–15.5)
WBC: 5.3 10*3/uL (ref 4.0–10.5)
nRBC: 0 % (ref 0.0–0.2)

## 2022-05-06 LAB — BASIC METABOLIC PANEL
Anion gap: 6 (ref 5–15)
BUN: 12 mg/dL (ref 8–23)
CO2: 25 mmol/L (ref 22–32)
Calcium: 8.2 mg/dL — ABNORMAL LOW (ref 8.9–10.3)
Chloride: 108 mmol/L (ref 98–111)
Creatinine, Ser: 0.88 mg/dL (ref 0.61–1.24)
GFR, Estimated: >60 mL/min (ref >60)
Glucose, Bld: 102 mg/dL — ABNORMAL HIGH (ref 70–99)
Potassium: 4.3 mmol/L (ref 3.5–5.1)
Sodium: 139 mmol/L (ref 135–145)

## 2022-05-06 MED ORDER — PROMETHAZINE HCL 25 MG PO TABS: 25.0000 mg | ORAL_TABLET | Freq: Four times a day (QID) | ORAL | Status: DC | PRN

## 2022-05-06 NOTE — Progress Notes (Signed)
Progress Note    05/06/2022 10:07 AM 1 Day Post-Op  Subjective:  Charles [name] is a 62 y.o. male with a past medical history of hypertension, GERD, and CAD as well as recent left BKA on 04/11/2023 who presents after a mechanical fall from standing yesterday stating that he fell onto the left BKA stump caused a massive amount of bleeding.    Is now postop day 1 from an AKA of the left lower extremity.  This morning on evaluation the patient was resting comfortably in bed eating breakfast.  Patient states his pain is still 8 out of 10 and more so when he is moving his extremity.  No complications overnight vitals all remained stable.  Vitals:   05/06/22 0439 05/06/22 0821  BP: (!) 137/93 (!) 142/87  Pulse: 64 66  Resp: 17 16  Temp: (!) 97.5 F (36.4 C) 97.8 F (36.6 C)  SpO2: 100% 96%   Physical Exam: Cardiac:  RRR Lungs:  Clear Throughout but diminished in the bases Incisions:  C/D/I  Extremities:  Left AKA revision from BKA Abdomen:  Positive bowel sounds, soft, NT/ND  Neurologic: AAOX3  CBC    Component Value Date/Time   WBC 5.3 05/06/2022 0414   RBC 3.26 (L) 05/06/2022 0414   HGB 7.9 (L) 05/06/2022 0414   HGB 13.1 04/15/2020 1308   HCT 26.6 (L) 05/06/2022 0414   HCT 38.7 04/15/2020 1308   PLT 381 05/06/2022 0414   PLT 216 04/15/2020 1308   MCV 81.6 05/06/2022 0414   MCV 92 04/15/2020 1308   MCV 71 (L) 01/27/2013 2020   MCH 24.2 (L) 05/06/2022 0414   MCHC 29.7 (L) 05/06/2022 0414   RDW 16.5 (H) 05/06/2022 0414   RDW 13.0 04/15/2020 1308   RDW 20.0 (H) 01/27/2013 2020   LYMPHSABS 1.8 05/05/2022 0201   MONOABS 0.6 05/05/2022 0201   EOSABS 0.2 05/05/2022 0201   BASOSABS 0.0 05/05/2022 0201    BMET    Component Value Date/Time   NA 139 05/06/2022 0414   NA 138 04/15/2020 1308   NA 140 01/27/2013 2020   K 4.3 05/06/2022 0414   K 3.7 01/27/2013 2020   CL 108 05/06/2022 0414   CL 107 01/27/2013 2020   CO2 25 05/06/2022 0414   CO2 26 01/27/2013 2020    GLUCOSE 102 (H) 05/06/2022 0414   GLUCOSE 85 01/27/2013 2020   BUN 12 05/06/2022 0414   BUN 18 04/15/2020 1308   BUN 16 01/27/2013 2020   CREATININE 0.88 05/06/2022 0414   CREATININE 0.98 01/27/2013 2020   CALCIUM 8.2 (L) 05/06/2022 0414   CALCIUM 8.6 01/27/2013 2020   GFRNONAA >60 05/06/2022 0414   GFRNONAA >60 01/27/2013 2020   GFRAA 97 04/15/2020 1308   GFRAA >60 01/27/2013 2020    INR    Component Value Date/Time   INR 1.1 01/21/2022 0900     Intake/Output Summary (Last 24 hours) at 05/06/2022 1007 Last data filed at 05/06/2022 0443 Gross per 24 hour  Intake 2345.77 ml  Output 2650 ml  Net -304.23 ml     Assessment/Plan:  62 y.o. male is s/p Left AKA revision from prior BKA due to trauma/fall. 1 Day Post-Op   Patient resting comfortably in bed this morning eating breakfast.  Continues to have pain.  Pain medicines have been ordered appropriately.  Left AKA dressing is dry clean and intact.  Drainage noted this morning.  Will continue to follow. Plan: Initial dressing removal postop day 5.  Then discharge to home with continued home PT as prior.    Drema Pry [facility] 05/06/2022 10:07 AM

## 2022-05-06 NOTE — Plan of Care (Signed)

## 2022-05-06 NOTE — Progress Notes (Addendum)
PROGRESS NOTE  ZUBIN PONTILLO ACZ:[id number] DOB: 19-Nov-1960 DOA: 05/04/2022 PCP: Mylinda Latina, PA-C  Hospital Course/Subjective: Charles [name] is a 62 y.o. male with medical history significant of CAD s/p PCI to LAD, type 2 diabetes, hypothyroidism, hypertension, hyperlipidemia, left foot osteomyelitis s/p BKA (04/09/2022) who is presenting due to a wound injury. He underwent left AKA with vascular surgery Dr. Delana Meyer on 05/05/22 due to wound gangrene.   This AM he is resting comfortably in his room on room air. Anticipates going home with wife when medically cleared.   Assessment/Plan:  Principal Problem:   Dehiscence of amputation stump (HCC) Active Problems:   Essential hypertension   Type II diabetes mellitus (HCC)   Normocytic anemia   Wound dehiscence, surgical, initial encounter    Assessment and Plan: * Dehiscence of amputation stump [facility]) Patient presenting with a wound dehiscence at the site of BKA (performed on 04/09/2022) after a ground-level fall at home.  Prior to the fall, he was not having any difficulty with his wound. - Vascular surgery consulted; went to OR for AKA 1/16 - Tylenol and Norco for pain control - Per vascular surgery, appears infected, he was started on broad-spectrum antibiotic will continue this for now until further input from surgery; continue Vanc/Rocephin for now  Essential hypertension - Continue home antihypertensives  Type II diabetes mellitus ([place]) Patient is not on antiglycemic agents at home.  Last A1c within normal limits at 5.4% approximately 4 months ago. - CBG monitoring - Can add SSI if needed  Normocytic anemia Patient has a history of normocytic anemia since 2020, however significant worsening postoperatively in December 2023.  Patient had significant blood loss with this wound dehiscence and injury, hemoglobin at 6.1 on 1/17.  Iron studies with low iron and saturation. -Monitor hemoglobin closely, he was transfused to  units of blood on 1/17 - Transfuse for hemoglobin less than 7 -Continue benefit from IV iron after the procedure  DVT Prophylaxis: SCDs  Code Status: Full Code   Family Communication: No family present, patient is AAOx4   Disposition Plan: Likely home with family when medically stable.   Consultants: Vascular Surgery   Procedures: L AKA 1/16   Antimicrobials: Anti-infectives (From admission, onward)    Start     Dose/Rate Route Frequency Ordered Stop   05/05/22 1100  vancomycin (VANCOREADY) IVPB 1250 mg/250 mL        1,250 mg 166.7 mL/hr over 90 Minutes Intravenous Every 12 hours 05/05/22 0021     05/04/22 2230  vancomycin (VANCOREADY) IVPB 2000 mg/400 mL       See Hyperspace for full Linked Orders Report.   2,000 mg 200 mL/hr over 120 Minutes Intravenous  Once 05/04/22 2144 05/05/22 0239   05/04/22 2230  vancomycin (VANCOREADY) IVPB 500 mg/100 mL       See Hyperspace for full Linked Orders Report.   500 mg 100 mL/hr over 60 Minutes Intravenous  Once 05/04/22 2144 05/05/22 0001   05/04/22 2200  cefTRIAXone (ROCEPHIN) 2 g in sodium chloride 0.9 % 100 mL IVPB        2 g 200 mL/hr over 30 Minutes Intravenous Every 24 hours 05/04/22 2105     05/04/22 1600  ceFAZolin (ANCEF) IVPB 1 g/50 mL premix        1 g 100 mL/hr over 30 Minutes Intravenous  Once 05/04/22 1543 05/04/22 1700       Objective: Vitals:   05/05/22 2038 05/06/22 0003 05/06/22 0439 05/06/22 1093  BP: (!) 146/84 119/63 (!) 137/93 (!) 142/87  Pulse: 86 74 64 66  Resp: '17 17 17 16  '$ Temp: 97.8 F (36.6 C) 97.6 F (36.4 C) (!) 97.5 F (36.4 C) 97.8 F (36.6 C)  TempSrc:    Oral  SpO2: 97% 96% 100% 96%  Weight:      Height:        Intake/Output Summary (Last 24 hours) at 05/06/2022 1011 Last data filed at 05/06/2022 0443 Gross per 24 hour  Intake 2345.77 ml  Output 2650 ml  Net -304.23 ml   Filed Weights   05/04/22 1335  Weight: 120.2 kg   Exam: General:  Alert, oriented, calm, in no acute  distress, sitting up in bed Eyes: EOMI, clear sclerea Neck: supple, no masses, trachea mildline  Cardiovascular: RRR, no murmurs or rubs, no peripheral edema  Respiratory: clear to auscultation bilaterally, no wheezes, no crackles  Abdomen: soft, nontender, nondistended, normal bowel tones heard  Skin: dry, no rashes  Musculoskeletal: L AKA with dressing in place  Psychiatric: appropriate affect, normal speech  Neurologic: extraocular muscles intact, clear speech, moving all extremities with intact sensorium   Data Reviewed: CBC: Recent Labs  Lab 05/04/22 1346 05/05/22 0201 05/06/22 0414  WBC 3.9* 4.3 5.3  NEUTROABS 2.2 1.7  --   HGB 7.1* 6.1* 7.9*  HCT 24.0* 21.7* 26.6*  MCV 79.7* 81.6 81.6  PLT 412* 363 696   Basic Metabolic Panel: Recent Labs  Lab 05/04/22 1346 05/05/22 0201 05/06/22 0414  NA 137 139 139  K 4.0 3.9 4.3  CL 104 107 108  CO2 '24 26 25  '$ GLUCOSE 126* 92 102*  BUN '17 16 12  '$ CREATININE 0.96 1.01 0.88  CALCIUM 8.6* 8.2* 8.2*   GFR: Estimated Creatinine Clearance: 119.7 mL/min (by C-G formula based on SCr of 0.88 mg/dL). Liver Function Tests: Recent Labs  Lab 05/04/22 1346  AST 23  ALT 12  ALKPHOS 86  BILITOT 0.7  PROT 6.6  ALBUMIN 3.0*   No results for input(s): "LIPASE", "AMYLASE" in the last 168 hours. No results for input(s): "AMMONIA" in the last 168 hours. Coagulation Profile: No results for input(s): "INR", "PROTIME" in the last 168 hours. Cardiac Enzymes: No results for input(s): "CKTOTAL", "CKMB", "CKMBINDEX", "TROPONINI" in the last 168 hours. BNP (last 3 results) No results for input(s): "PROBNP" in the last 8760 hours. HbA1C: No results for input(s): "HGBA1C" in the last 72 hours. CBG: No results for input(s): "GLUCAP" in the last 168 hours. Lipid Profile: No results for input(s): "CHOL", "HDL", "LDLCALC", "TRIG", "CHOLHDL", "LDLDIRECT" in the last 72 hours. Thyroid Function Tests: No results for input(s): "TSH", "T4TOTAL",  "FREET4", "T3FREE", "THYROIDAB" in the last 72 hours. Anemia Panel: Recent Labs    05/04/22 1346  TIBC 365  IRON 17*   Urine analysis:    Component Value Date/Time   COLORURINE YELLOW (A) 10/19/2021 2216   APPEARANCEUR CLEAR (A) 10/19/2021 2216   APPEARANCEUR Cloudy (A) 11/21/2020 1106   LABSPEC 1.014 10/19/2021 2216   LABSPEC 1.004 01/27/2013 2020   PHURINE 5.0 10/19/2021 2216   GLUCOSEU NEGATIVE 10/19/2021 2216   GLUCOSEU Negative 01/27/2013 2020   HGBUR NEGATIVE 10/19/2021 2216   [place] NEGATIVE 10/19/2021 2216   BILIRUBINUR Negative 11/21/2020 1106   BILIRUBINUR Negative 01/27/2013 2020   KETONESUR NEGATIVE 10/19/2021 2216   PROTEINUR NEGATIVE 10/19/2021 2216   NITRITE NEGATIVE 10/19/2021 2216   LEUKOCYTESUR NEGATIVE 10/19/2021 2216   LEUKOCYTESUR Negative 01/27/2013 2020   Sepsis Labs: '@LABRCNTIP'$ (procalcitonin:4,lacticidven:4)  )  No results found for this or any previous visit (from the past 240 hour(s)).   Studies: No results found.  Scheduled Meds:  amLODipine  5 mg Oral Daily   atorvastatin  40 mg Oral Daily   buPROPion  150 mg Oral Daily   carvedilol  3.125 mg Oral BID WC   DULoxetine  60 mg Oral Daily   levothyroxine  75 mcg Oral Q0600   metaxalone  1,600 mg Oral BID   oxyCODONE  15 mg Oral 5 X Daily    Continuous Infusions:  cefTRIAXone (ROCEPHIN)  IV 2 g (05/05/22 2022)   iron sucrose     vancomycin 1,250 mg (05/06/22 1002)     LOS: 2 days   Time spent: 28 minutes  Angelee Bahr Marry Guan, MD [facility] Pager [phone]  If 7PM-7AM, please contact night-coverage [email or web address] Password [facility] 05/06/2022, 10:11 AM

## 2022-05-06 NOTE — TOC Transition Note (Signed)
Transition of Care [facility]) - CM/SW Discharge Note   Patient Details  Name: Charles [name] MRN: [id number] Date of Birth: 18-Mar-1961  [facility] CM/SW Contact:  Quin Hoop, LCSW Phone Number: 05/06/2022, 12:44 PM   Clinical Narrative:    Readmit assessment completed with patient via phone.  Patient states that his PC P is Dr. Chauncey Cruel.  He uses a transport service to get to and from his appointments.  Patient uses [facility] in [place] to fill his prescriptions.  He has no concerns with paying for medications.  Patient has a walker at home, but states that he needs a wheelchair.  Patient waiting to be seen by PT/OT for recommendations for next level of care.  TOC to continue to follow for needs. Patient would like a wheelchair.       Barriers to Discharge: Continued Medical Work up   Patient Goals and CMS Choice      Discharge Placement                         Discharge Plan and Services Additional resources added to the After Visit Summary for                                       Social Determinants of Health (SDOH) Interventions SDOH Screenings   Food Insecurity: Food Insecurity Present (05/04/2022)  Transportation Needs: Unmet Transportation Needs (05/04/2022)  Alcohol Screen: Low Risk  (05/08/2021)  Depression (PHQ2-9): Low Risk  (01/20/2022)  Financial Resource Strain: High Risk (12/01/2018)  Physical Activity: Sufficiently Active (12/01/2018)  Social Connections: Socially Integrated (12/01/2018)  Stress: Stress Concern Present (12/01/2018)  Tobacco Use: Low Risk  (05/06/2022)     Readmission Risk Interventions    04/10/2022    4:15 PM 10/22/2021    1:14 PM 08/31/2021    9:49 AM  Readmission Risk Prevention Plan  Transportation Screening Complete Complete Complete  PCP or Specialist Appt within 3-5 Days  Complete Complete  HRI or [place]   Complete  Social Work Consult for [place] Planning/Counseling  Complete  Complete  Palliative Care Screening  Not Applicable Not Applicable  Medication Review Press photographer) Complete Complete Complete  PCP or Specialist appointment within 3-5 days of discharge Complete    HRI or [place] Complete    SW Recovery Care/Counseling Consult Complete    [place] Not Applicable

## 2022-05-07 DIAGNOSIS — T8781 Dehiscence of amputation stump: Secondary | ICD-10-CM | POA: Diagnosis not present

## 2022-05-07 LAB — CBC
HCT: 27.5 % — ABNORMAL LOW (ref 39.0–52.0)
Hemoglobin: 7.9 g/dL — ABNORMAL LOW (ref 13.0–17.0)
MCH: 24 pg — ABNORMAL LOW (ref 26.0–34.0)
MCHC: 28.7 g/dL — ABNORMAL LOW (ref 30.0–36.0)
MCV: 83.6 fL (ref 80.0–100.0)
Platelets: 317 10*3/uL (ref 150–400)
RBC: 3.29 MIL/uL — ABNORMAL LOW (ref 4.22–5.81)
RDW: 17 % — ABNORMAL HIGH (ref 11.5–15.5)
WBC: 4.3 10*3/uL (ref 4.0–10.5)
nRBC: 0 % (ref 0.0–0.2)

## 2022-05-07 LAB — BASIC METABOLIC PANEL
Anion gap: 3 — ABNORMAL LOW (ref 5–15)
BUN: 9 mg/dL (ref 8–23)
CO2: 30 mmol/L (ref 22–32)
Calcium: 8.4 mg/dL — ABNORMAL LOW (ref 8.9–10.3)
Chloride: 108 mmol/L (ref 98–111)
Creatinine, Ser: 0.83 mg/dL (ref 0.61–1.24)
GFR, Estimated: >60 mL/min (ref >60)
Glucose, Bld: 113 mg/dL — ABNORMAL HIGH (ref 70–99)
Potassium: 4 mmol/L (ref 3.5–5.1)
Sodium: 141 mmol/L (ref 135–145)

## 2022-05-07 LAB — SURGICAL PATHOLOGY

## 2022-05-07 MED ORDER — SULFAMETHOXAZOLE-TRIMETHOPRIM 800-160 MG PO TABS
1.0000 | ORAL_TABLET | Freq: Two times a day (BID) | ORAL | Status: DC
  Administered 2022-05-08: 1 via ORAL
  Filled 2022-05-07: qty 1

## 2022-05-07 NOTE — Plan of Care (Signed)

## 2022-05-07 NOTE — Progress Notes (Signed)
Progress Note    05/07/2022 3:56 PM 2 Days Post-Op  Subjective:  Charles [name] is a 62 y.o. male with a past medical history of hypertension, GERD, and CAD as well as recent left BKA on 04/11/2023 who presents after a mechanical fall from standing yesterday stating that he fell onto the left BKA stump caused a massive amount of bleeding.     Is now postop day #2 from an AKA of the left lower extremity.  This morning on evaluation the patient was resting comfortably in bed eating breakfast. Patient states his pain is still 7 out of 10 and more so when he is moving his extremity.  No complications overnight vitals all remained stable.   Vitals:   05/07/22 0755 05/07/22 1542  BP: (!) 152/80 117/68  Pulse: 66 72  Resp: 16 16  Temp: 97.9 F (36.6 C) (!) 97.5 F (36.4 C)  SpO2: 97% 98%   Physical Exam: Cardiac:  RRR Lungs:  Clear Throughout but diminished in the bases  Incisions:  C/D/I  Extremities:   Left AKA revision from BKA  Abdomen:  Positive bowel sounds, soft, NT/ND   Neurologic: AAOX3   CBC    Component Value Date/Time   WBC 4.3 05/07/2022 1101   RBC 3.29 (L) 05/07/2022 1101   HGB 7.9 (L) 05/07/2022 1101   HGB 13.1 04/15/2020 1308   HCT 27.5 (L) 05/07/2022 1101   HCT 38.7 04/15/2020 1308   PLT 317 05/07/2022 1101   PLT 216 04/15/2020 1308   MCV 83.6 05/07/2022 1101   MCV 92 04/15/2020 1308   MCV 71 (L) 01/27/2013 2020   MCH 24.0 (L) 05/07/2022 1101   MCHC 28.7 (L) 05/07/2022 1101   RDW 17.0 (H) 05/07/2022 1101   RDW 13.0 04/15/2020 1308   RDW 20.0 (H) 01/27/2013 2020   LYMPHSABS 1.8 05/05/2022 0201   MONOABS 0.6 05/05/2022 0201   EOSABS 0.2 05/05/2022 0201   BASOSABS 0.0 05/05/2022 0201    BMET    Component Value Date/Time   NA 141 05/07/2022 1101   NA 138 04/15/2020 1308   NA 140 01/27/2013 2020   K 4.0 05/07/2022 1101   K 3.7 01/27/2013 2020   CL 108 05/07/2022 1101   CL 107 01/27/2013 2020   CO2 30 05/07/2022 1101   CO2 26 01/27/2013 2020    GLUCOSE 113 (H) 05/07/2022 1101   GLUCOSE 85 01/27/2013 2020   BUN 9 05/07/2022 1101   BUN 18 04/15/2020 1308   BUN 16 01/27/2013 2020   CREATININE 0.83 05/07/2022 1101   CREATININE 0.98 01/27/2013 2020   CALCIUM 8.4 (L) 05/07/2022 1101   CALCIUM 8.6 01/27/2013 2020   GFRNONAA >60 05/07/2022 1101   GFRNONAA >60 01/27/2013 2020   GFRAA 97 04/15/2020 1308   GFRAA >60 01/27/2013 2020    INR    Component Value Date/Time   INR 1.1 01/21/2022 0900     Intake/Output Summary (Last 24 hours) at 05/07/2022 1556 Last data filed at 05/07/2022 0900 Gross per 24 hour  Intake 1450.4 ml  Output 1200 ml  Net 250.4 ml     Assessment/Plan:  62 y.o. male is s/p Left AKA revision from prior BKA due to trauma/fall.  2 Days Post-Op  Patient resting comfortably in bed this morning eating breakfast.  Continues to have pain.  Pain medicines have been ordered appropriately.  Left AKA dressing is dry clean and intact.  Small amount of drainage noted this morning.  Will continue to follow.  Plan: Initial dressing removal postop day 5.  Patients IV antibiotics changed to PO bactrim starting tomorrow to complete a 7 day course. Discussed with Pharmacy who agrees with antibiotic plan. Then discharge to home with continued home PT as prior. Patient to follow up with Vein and Vascular in 3 weeks after discharge.   I had a long conversation with the patient regarding falling on the stump of his left leg. He was informed in the event of another traumatic accident opening his stump to the bone again would result in a surgery requiring disarticulation of his hip. This will remove any possibility of walking with a prosthetic limb. He verbalized his understanding.      Drema Pry [facility] 05/07/2022 3:56 PM

## 2022-05-07 NOTE — Progress Notes (Signed)
Pharmacy Antibiotic Note  Charles [name] is a 62 y.o. male admitted on 05/04/2022 with  wound infection .  Pharmacy has been consulted for Vancomycin dosing.  Patient has BKA to AKA conversion on 1/16.  Plan: Continue vancomycin 1250 mg IV every 12 hours AUC goal 400-550 Estimated AUC 478.4, Cmin 12.9 Calculated using Scr 0.88, Wt 120 kg Continue ceftriaxone 2 grams IV every 24 hours Follow renal function for adjustments  Height: '6\' 1"'$  (185.4 cm) Weight: 120.2 kg (264 lb 15.9 oz) IBW/kg (Calculated) : 79.9  Temp (24hrs), Avg:97.9 F (36.6 C), Min:97.7 F (36.5 C), Max:98.2 F (36.8 C)  Recent Labs  Lab 05/04/22 1346 05/05/22 0201 05/06/22 0414  WBC 3.9* 4.3 5.3  CREATININE 0.96 1.01 0.88     Estimated Creatinine Clearance: 119.7 mL/min (by C-G formula based on SCr of 0.88 mg/dL).    Allergies  Allergen Reactions   Lisinopril Cough    Antimicrobials this admission: Vancomycin 1/16>>  Ceftriaxone 1/16>>   Dose adjustments this admission: N/A  Microbiology results:  N/A  Thank you for allowing pharmacy to be a part of this patient's care.  Lorin Picket, PharmD 05/07/2022 10:49 AM

## 2022-05-07 NOTE — Progress Notes (Signed)
Pharmacy - Antimicrobial Stewardship (Brief Note)  Discussed antibiotic plan with Annalee Genta, NP with vascular.  Complete IV antibiotics today and change to bactrim PO to complete a 7 day post-op antibiotic course following BKA to AKA after falling onto BKA and opening stump with bone splintering.    Doreene Eland, PharmD, BCPS, BCIDP Work Cell: [phone] 05/07/2022 3:41 PM

## 2022-05-07 NOTE — Progress Notes (Signed)
PROGRESS NOTE  Charles [name] NLZ:[id number] DOB: [birth year] DOA: 05/04/2022 PCP: Mylinda Latina, PA-C  Hospital Course/Subjective: Charles [name] is a 62 y.o. male with medical history significant of CAD s/p PCI to LAD, type 2 diabetes, hypothyroidism, hypertension, hyperlipidemia, left foot osteomyelitis s/p BKA (04/09/2022) who is presenting due to a wound injury. He underwent left AKA with vascular surgery Dr. Delana Meyer on 05/05/22 due to wound gangrene.   Resting comfortably this morning, having some pain in his left AKA site, but it is tolerable.  Assessment/Plan:  Principal Problem:   Dehiscence of amputation stump (HCC) Active Problems:   Essential hypertension   Type II diabetes mellitus (HCC)   Normocytic anemia   Wound dehiscence, surgical, initial encounter   Assessment and Plan: * Dehiscence of amputation stump [facility]) Patient presenting with a wound dehiscence at the site of BKA (performed on 04/09/2022) after a ground-level fall at home.  Prior to the fall, he was not having any difficulty with his wound. - Vascular surgery consulted; went to OR for AKA 1/16 - Tylenol and Norco for pain control - Per vascular surgery, appears infected, he was started on broad-spectrum antibiotic will continue this for now until further input from surgery; continue Vanc/Rocephin for now  Essential hypertension - Continue home antihypertensives  Type II diabetes mellitus ([place]) Patient is not on antiglycemic agents at home.  Last A1c within normal limits at 5.4% approximately 4 months ago. - CBG monitoring - Can add SSI if needed  Normocytic anemia Patient has a history of normocytic anemia since 2020, however significant worsening postoperatively in December 2023.  Patient had significant blood loss with this wound dehiscence and injury, hemoglobin at 6.1 on 1/17.  Iron studies with low iron and saturation. -Monitor hemoglobin closely, he was transfused to units of blood on  1/17 - Transfuse for hemoglobin less than 7  DVT Prophylaxis: SCDs  Code Status: Full Code   Family Communication: No family present, patient is AAOx4   Disposition Plan: Likely home with family when medically stable.  Wheelchair ordered.  Consultants: Vascular Surgery   Procedures: L AKA 1/16   Antimicrobials: Anti-infectives (From admission, onward)    Start     Dose/Rate Route Frequency Ordered Stop   05/05/22 1100  vancomycin (VANCOREADY) IVPB 1250 mg/250 mL        1,250 mg 166.7 mL/hr over 90 Minutes Intravenous Every 12 hours 05/05/22 0021     05/04/22 2230  vancomycin (VANCOREADY) IVPB 2000 mg/400 mL       See Hyperspace for full Linked Orders Report.   2,000 mg 200 mL/hr over 120 Minutes Intravenous  Once 05/04/22 2144 05/05/22 0239   05/04/22 2230  vancomycin (VANCOREADY) IVPB 500 mg/100 mL       See Hyperspace for full Linked Orders Report.   500 mg 100 mL/hr over 60 Minutes Intravenous  Once 05/04/22 2144 05/05/22 0001   05/04/22 2200  cefTRIAXone (ROCEPHIN) 2 g in sodium chloride 0.9 % 100 mL IVPB        2 g 200 mL/hr over 30 Minutes Intravenous Every 24 hours 05/04/22 2105     05/04/22 1600  ceFAZolin (ANCEF) IVPB 1 g/50 mL premix        1 g 100 mL/hr over 30 Minutes Intravenous  Once 05/04/22 1543 05/04/22 1700       Objective: Vitals:   05/06/22 0821 05/06/22 2025 05/06/22 2309 05/07/22 0755  BP: (!) 142/87 127/73 122/69 (!) 152/80  Pulse: 66 70 (!)  59 66  Resp: '16 20 18 16  '$ Temp: 97.8 F (36.6 C) 98.2 F (36.8 C) 97.7 F (36.5 C) 97.9 F (36.6 C)  TempSrc: Oral Oral  Oral  SpO2: 96%  96% 97%  Weight:      Height:        Intake/Output Summary (Last 24 hours) at 05/07/2022 0925 Last data filed at 05/07/2022 0641 Gross per 24 hour  Intake 1450.4 ml  Output 1000 ml  Net 450.4 ml    Filed Weights   05/04/22 1335  Weight: 120.2 kg   Exam: General exam: Appears calm and comfortable  Respiratory system: Clear to auscultation. Respiratory  effort normal. Cardiovascular system: S1 & S2 heard, RRR. No JVD, murmurs, rubs, gallops or clicks. No pedal edema. Gastrointestinal system: Abdomen is nondistended, soft and nontender. No organomegaly or masses felt. Normal bowel sounds heard. Central nervous system: Alert and orientedx3. No focal neurological deficits. Extremities: Symmetric 5 x 5 power.  S/post left AKA. Skin: No rashes, lesions or ulcers Psychiatry: Judgement and insight appear normal. Mood & affect appropriate.  Data Reviewed: CBC: Recent Labs  Lab 05/04/22 1346 05/05/22 0201 05/06/22 0414  WBC 3.9* 4.3 5.3  NEUTROABS 2.2 1.7  --   HGB 7.1* 6.1* 7.9*  HCT 24.0* 21.7* 26.6*  MCV 79.7* 81.6 81.6  PLT 412* 363 194    Basic Metabolic Panel: Recent Labs  Lab 05/04/22 1346 05/05/22 0201 05/06/22 0414  NA 137 139 139  K 4.0 3.9 4.3  CL 104 107 108  CO2 '24 26 25  '$ GLUCOSE 126* 92 102*  BUN '17 16 12  '$ CREATININE 0.96 1.01 0.88  CALCIUM 8.6* 8.2* 8.2*    GFR: Estimated Creatinine Clearance: 119.7 mL/min (by C-G formula based on SCr of 0.88 mg/dL). Liver Function Tests: Recent Labs  Lab 05/04/22 1346  AST 23  ALT 12  ALKPHOS 86  BILITOT 0.7  PROT 6.6  ALBUMIN 3.0*    No results for input(s): "LIPASE", "AMYLASE" in the last 168 hours. No results for input(s): "AMMONIA" in the last 168 hours. Coagulation Profile: No results for input(s): "INR", "PROTIME" in the last 168 hours. Cardiac Enzymes: No results for input(s): "CKTOTAL", "CKMB", "CKMBINDEX", "TROPONINI" in the last 168 hours. BNP (last 3 results) No results for input(s): "PROBNP" in the last 8760 hours. HbA1C: No results for input(s): "HGBA1C" in the last 72 hours. CBG: No results for input(s): "GLUCAP" in the last 168 hours. Lipid Profile: No results for input(s): "CHOL", "HDL", "LDLCALC", "TRIG", "CHOLHDL", "LDLDIRECT" in the last 72 hours. Thyroid Function Tests: No results for input(s): "TSH", "T4TOTAL", "FREET4", "T3FREE",  "THYROIDAB" in the last 72 hours. Anemia Panel: Recent Labs    05/04/22 1346  TIBC 365  IRON 17*    Urine analysis:    Component Value Date/Time   COLORURINE YELLOW (A) 10/19/2021 2216   APPEARANCEUR CLEAR (A) 10/19/2021 2216   APPEARANCEUR Cloudy (A) 11/21/2020 1106   LABSPEC 1.014 10/19/2021 2216   LABSPEC 1.004 01/27/2013 2020   PHURINE 5.0 10/19/2021 2216   GLUCOSEU NEGATIVE 10/19/2021 2216   GLUCOSEU Negative 01/27/2013 2020   HGBUR NEGATIVE 10/19/2021 2216   [place] 10/19/2021 2216   BILIRUBINUR Negative 11/21/2020 1106   BILIRUBINUR Negative 01/27/2013 2020   KETONESUR NEGATIVE 10/19/2021 2216   PROTEINUR NEGATIVE 10/19/2021 2216   NITRITE NEGATIVE 10/19/2021 2216   LEUKOCYTESUR NEGATIVE 10/19/2021 2216   LEUKOCYTESUR Negative 01/27/2013 2020   Sepsis Labs: '@LABRCNTIP'$ (procalcitonin:4,lacticidven:4)  )No results found for this or any previous  visit (from the past 240 hour(s)).   Studies: No results found.  Scheduled Meds:  amLODipine  5 mg Oral Daily   atorvastatin  40 mg Oral Daily   buPROPion  150 mg Oral Daily   carvedilol  3.125 mg Oral BID WC   DULoxetine  60 mg Oral Daily   levothyroxine  75 mcg Oral Q0600   metaxalone  1,600 mg Oral BID   oxyCODONE  15 mg Oral 5 X Daily    Continuous Infusions:  cefTRIAXone (ROCEPHIN)  IV 2 g (05/06/22 2210)   vancomycin 1,250 mg (05/06/22 2302)     LOS: 3 days   Time spent: 28 minutes  Wendelin Bradt Marry Guan, MD [facility] Pager [phone]  If 7PM-7AM, please contact night-coverage [email or web address] Password [facility] 05/07/2022, 9:25 AM

## 2022-05-08 ENCOUNTER — Ambulatory Visit (INDEPENDENT_AMBULATORY_CARE_PROVIDER_SITE_OTHER): Payer: Medicaid Other | Admitting: Nurse Practitioner

## 2022-05-08 DIAGNOSIS — T8781 Dehiscence of amputation stump: Secondary | ICD-10-CM | POA: Diagnosis not present

## 2022-05-08 LAB — BASIC METABOLIC PANEL
Anion gap: 6 (ref 5–15)
BUN: 14 mg/dL (ref 8–23)
CO2: 22 mmol/L (ref 22–32)
Calcium: 7.8 mg/dL — ABNORMAL LOW (ref 8.9–10.3)
Chloride: 110 mmol/L (ref 98–111)
Creatinine, Ser: 0.94 mg/dL (ref 0.61–1.24)
GFR, Estimated: >60 mL/min (ref >60)
Glucose, Bld: 98 mg/dL (ref 70–99)
Potassium: 4.2 mmol/L (ref 3.5–5.1)
Sodium: 138 mmol/L (ref 135–145)

## 2022-05-08 LAB — CBC
HCT: 27.9 % — ABNORMAL LOW (ref 39.0–52.0)
Hemoglobin: 8.1 g/dL — ABNORMAL LOW (ref 13.0–17.0)
MCH: 24.3 pg — ABNORMAL LOW (ref 26.0–34.0)
MCHC: 29 g/dL — ABNORMAL LOW (ref 30.0–36.0)
MCV: 83.5 fL (ref 80.0–100.0)
Platelets: 328 10*3/uL (ref 150–400)
RBC: 3.34 MIL/uL — ABNORMAL LOW (ref 4.22–5.81)
RDW: 17.2 % — ABNORMAL HIGH (ref 11.5–15.5)
WBC: 5.1 10*3/uL (ref 4.0–10.5)
nRBC: 0 % (ref 0.0–0.2)

## 2022-05-08 MED ORDER — SENNA 8.6 MG PO TABS: 1.0000 | ORAL_TABLET | Freq: Every day | ORAL | 0 refills | Status: DC | PRN

## 2022-05-08 MED ORDER — SULFAMETHOXAZOLE-TRIMETHOPRIM 800-160 MG PO TABS: 1.0000 | ORAL_TABLET | Freq: Two times a day (BID) | ORAL | 0 refills | Status: AC

## 2022-05-08 MED ORDER — OXYCODONE HCL 15 MG PO TABS: 15.0000 mg | ORAL_TABLET | Freq: Four times a day (QID) | ORAL | 0 refills | Status: DC | PRN

## 2022-05-08 NOTE — Progress Notes (Signed)
Progress Note    05/08/2022 3:14 PM 3 Days Post-Op  Subjective:  Charles [name] is a 62 y.o. male with a past medical history of hypertension, GERD, and CAD as well as recent left BKA on 04/11/2023 who presents after a mechanical fall from standing yesterday stating that he fell onto the left BKA stump caused a massive amount of bleeding.     Is now postop day #3 from an AKA of the left lower extremity.  This morning on evaluation the patient was resting comfortably in bed eating breakfast. Patient states his pain is still 7 out of 10 and more so when he is moving his extremity.  No complications overnight vitals all remained stable.     Vitals:   05/08/22 0054 05/08/22 0818  BP: 133/74 (!) 159/97  Pulse: 68 68  Resp: 18 16  Temp: (!) 97.4 F (36.3 C) 97.8 F (36.6 C)  SpO2: 97% 97%   Physical Exam: Cardiac:  RRR Lungs:  Clear Throughout but diminished in the bases   Incisions:  C/D/I Extremities:   Left AKA revision from BKA   Abdomen:  Positive bowel sounds, soft, NT/ND    Neurologic: AAOX3    CBC    Component Value Date/Time   WBC 5.1 05/08/2022 0505   RBC 3.34 (L) 05/08/2022 0505   HGB 8.1 (L) 05/08/2022 0505   HGB 13.1 04/15/2020 1308   HCT 27.9 (L) 05/08/2022 0505   HCT 38.7 04/15/2020 1308   PLT 328 05/08/2022 0505   PLT 216 04/15/2020 1308   MCV 83.5 05/08/2022 0505   MCV 92 04/15/2020 1308   MCV 71 (L) 01/27/2013 2020   MCH 24.3 (L) 05/08/2022 0505   MCHC 29.0 (L) 05/08/2022 0505   RDW 17.2 (H) 05/08/2022 0505   RDW 13.0 04/15/2020 1308   RDW 20.0 (H) 01/27/2013 2020   LYMPHSABS 1.8 05/05/2022 0201   MONOABS 0.6 05/05/2022 0201   EOSABS 0.2 05/05/2022 0201   BASOSABS 0.0 05/05/2022 0201    BMET    Component Value Date/Time   NA 138 05/08/2022 0505   NA 138 04/15/2020 1308   NA 140 01/27/2013 2020   K 4.2 05/08/2022 0505   K 3.7 01/27/2013 2020   CL 110 05/08/2022 0505   CL 107 01/27/2013 2020   CO2 22 05/08/2022 0505   CO2 26 01/27/2013  2020   GLUCOSE 98 05/08/2022 0505   GLUCOSE 85 01/27/2013 2020   BUN 14 05/08/2022 0505   BUN 18 04/15/2020 1308   BUN 16 01/27/2013 2020   CREATININE 0.94 05/08/2022 0505   CREATININE 0.98 01/27/2013 2020   CALCIUM 7.8 (L) 05/08/2022 0505   CALCIUM 8.6 01/27/2013 2020   GFRNONAA >60 05/08/2022 0505   GFRNONAA >60 01/27/2013 2020   GFRAA 97 04/15/2020 1308   GFRAA >60 01/27/2013 2020    INR    Component Value Date/Time   INR 1.1 01/21/2022 0900     Intake/Output Summary (Last 24 hours) at 05/08/2022 1514 Last data filed at 05/08/2022 1249 Gross per 24 hour  Intake --  Output 2150 ml  Net -2150 ml     Assessment/Plan:  62 y.o. male is s/p Left AKA revision from prior BKA due to trauma/fall.  2 Days Post-Op   Patient resting comfortably in bed this afternoon.  Continues to have pain.  Pain medicines have been ordered appropriately.  Left AKA dressing is dry clean and intact.  Small amount of drainage noted this morning.  Will continue  to follow.   Plan: Initial dressing removal done today.  Patients IV antibiotics changed to PO bactrim starting tomorrow to complete a 7 day course. Discussed with Pharmacy who agrees with antibiotic plan. Then discharge to home with continued home PT as prior. Patient to follow up with [facility] V[facility] in 3 weeks after discharge.    I had a long conversation with the patient regarding falling on the stump of his left leg. He was informed in the event of another traumatic accident opening his stump to the bone again would result in a surgery requiring disarticulation of his hip. This will remove any possibility of walking with a prosthetic limb. He verbalized his understanding.   3 Days Post-Op     Drema Pry [facility] 05/08/2022 3:14 PM

## 2022-05-08 NOTE — Plan of Care (Signed)
Problem: Education: Goal: Knowledge of the prescribed therapeutic regimen will improve 05/08/2022 1605 by Tyrrell Stephens Bet, LPN Outcome: Adequate for Discharge 05/08/2022 1142 by Chuck Caban Bet, LPN Outcome: Progressing Goal: Ability to verbalize activity precautions or restrictions will improve 05/08/2022 1605 by Sarahgrace Broman Bet, LPN Outcome: Adequate for Discharge 05/08/2022 1142 by Lyris itchman Bet, LPN Outcome: Progressing Goal: Understanding of discharge needs will improve 05/08/2022 1605 by Vasili Fok Bet, LPN Outcome: Adequate for Discharge 05/08/2022 1142 by Avonna Iribe Bet, LPN Outcome: Progressing   Problem: Activity: Goal: Ability to perform//tolerate increased activity and mobilize with assistive devices will improve 05/08/2022 1605 by Maile Linford Bet, LPN Outcome: Adequate for Discharge 05/08/2022 1142 by Kohle Winner Bet, LPN Outcome: Progressing   Problem: Clinical Measurements: Goal: Postoperative complications will be avoided or minimized 05/08/2022 1605 by Tamario eal Bet, LPN Outcome: Adequate for Discharge 05/08/2022 1142 by Daaiel Starlin Bet, LPN Outcome: Progressing   Problem: Self-Care: Goal: Ability to meet self-care needs will improve 05/08/2022 1605 by Marleena Shubert Bet, LPN Outcome: Adequate for Discharge 05/08/2022 1142 by Dnasia Gauna Bet, LPN Outcome: Progressing   Problem: Self-Concept: Goal: Ability to maintain and perform role responsibilities to the fullest extent possible will improve 05/08/2022 1605 by ajer Dwyer Bet, LPN Outcome: Adequate for Discharge 05/08/2022 1142 by Deb Loudin Bet, LPN Outcome: Progressing   Problem: Pain Management: Goal: Pain level will decrease with appropriate interventions 05/08/2022 1605 by Sierrah Luevano Bet, LPN Outcome: Adequate for Discharge 05/08/2022 1142 by Iara Monds Bet, LPN Outcome: Progressing   Problem: Skin Integrity: Goal: Demonstration of wound healing without infection will improve 05/08/2022 1605 by Peony Barner Bet,  LPN Outcome: Adequate for Discharge 05/08/2022 1142 by Sarita akanson Bet, LPN Outcome: Progressing   Problem: Education: Goal: Knowledge of General Education information will improve Description: Including pain rating scale, medication(s)/side effects and non-pharmacologic comfort measures 05/08/2022 1605 by Ridhi offert,  Bet, LPN Outcome: Adequate for Discharge 05/08/2022 1142 by Yoselin Amerman Bet, LPN Outcome: Progressing   Problem: ealth Behavior/Discharge Planning: Goal: Ability to manage health-related needs will improve 05/08/2022 1605 by Melbourne Jakubiak Bet, LPN Outcome: Adequate for Discharge 05/08/2022 1142 by Joshuan Bolander Bet, LPN Outcome: Progressing   Problem: Clinical Measurements: Goal: Ability to maintain clinical measurements within normal limits will improve 05/08/2022 1605 by Ethaniel Garfield Bet, LPN Outcome: Adequate for Discharge 05/08/2022 1142 by Tyron Manetta Bet, LPN Outcome: Progressing Goal: Will remain free from infection 05/08/2022 1605 by Ameer Sanden Bet, LPN Outcome: Adequate for Discharge 05/08/2022 1142 by Tremain Rucinski Bet, LPN Outcome: Progressing Goal: Diagnostic test results will improve 05/08/2022 1605 by Myeasha Ballowe Bet, LPN Outcome: Adequate for Discharge 05/08/2022 1142 by Ryaan Vanwagoner Bet, LPN Outcome: Progressing Goal: Respiratory complications will improve 05/08/2022 1605 by Kenith Trickel Bet, LPN Outcome: Adequate for Discharge 05/08/2022 1142 by Makaelyn Aponte Bet, LPN Outcome: Progressing Goal: Cardiovascular complication will be avoided 05/08/2022 1605 by Enid Maultsby Bet, LPN Outcome: Adequate for Discharge 05/08/2022 1142 by  Wenzlick Bet, LPN Outcome: Progressing   Problem: Activity: Goal: Risk for activity intolerance will decrease 05/08/2022 1605 by Shirlena Brinegar Bet, LPN Outcome: Adequate for Discharge 05/08/2022 1142 by Maurisa Tesmer Bet, LPN Outcome: Progressing   Problem: Nutrition: Goal: Adequate nutrition will be maintained 05/08/2022 1605 by Dionne Rossa Bet,  LPN Outcome: Adequate for Discharge 05/08/2022 1142 by Nechemia Chiappetta Bet, LPN Outcome: Progressing   Problem: Coping: Goal: Level of anxiety will decrease 05/08/2022 1605 by itesh Fouche Bet, LPN Outcome: Adequate for Discharge 05/08/2022 1142 by ,  Lila Lufkin Bet, LPN Outcome: Progressing   Problem: Elimination: Goal: Will not experience complications related to bowel motility 05/08/2022 1605 by Dalin Caldera Bet, LPN Outcome: Adequate for Discharge 05/08/2022 1142 by Erol Flanagin Bet, LPN Outcome: Progressing Goal: Will not experience complications related to urinary retention 05/08/2022 1605 by Anessia Oakland Bet, LPN Outcome: Adequate for Discharge 05/08/2022 1142 by Betzabe Bevans Bet, LPN Outcome: Progressing   Problem: Pain Managment: Goal: General experience of comfort will improve 05/08/2022 1605 by Bama Hanselman Bet, LPN Outcome: Adequate for Discharge 05/08/2022 1142 by Suhey Radford Bet, LPN Outcome: Progressing   Problem: Safety: Goal: Ability to remain free from injury will improve 05/08/2022 1605 by Darnell Jeschke Bet, LPN Outcome: Adequate for Discharge 05/08/2022 1142 by Junior Kenedy Bet, LPN Outcome: Progressing   Problem: Skin Integrity: Goal: Risk for impaired skin integrity will decrease 05/08/2022 1605 by Audi Conover Bet, LPN Outcome: Adequate for Discharge 05/08/2022 1142 by Emilee Market Bet, LPN Outcome: Progressing

## 2022-05-08 NOTE — Progress Notes (Signed)
Discharge Note:  Reviewed discharge instructions with pt. Pt verbalized understanding. Pt discharged with all personal belongings and also a RW. Staff Wheeled pt out. Pt transported to home via family vehicle.

## 2022-05-08 NOTE — Plan of Care (Signed)
  Problem: Activity: Goal: Ability to perform//tolerate increased activity and mobilize with assistive devices will improve Outcome: Progressing   Problem: Education: Goal: Knowledge of General Education information will improve Description: Including pain rating scale, medication(s)/side effects and non-pharmacologic comfort measures Outcome: Progressing   Problem: Pain Management: Goal: Pain level will decrease with appropriate interventions Outcome: Progressing   Problem: Nutrition: Goal: Adequate nutrition will be maintained Outcome: Progressing   Problem: Activity: Goal: Risk for activity intolerance will decrease Outcome: Progressing

## 2022-05-08 NOTE — Discharge Summary (Signed)
Discharge Summary  Charles [name] HGD:[id number] DOB: Nov 15, 1960  PCP: Mylinda Latina, PA-C  Admit date: 05/04/2022 Discharge date: 05/08/2022   Recommendations for Outpatient Follow-up:  Please follow up with your PCP with CBC and BMP in 1-2 weeks Follow-up with vascular surgery as listed below, call for appointment in the next 3 weeks.  Discharge Diagnoses:  Active Hospital Problems   Diagnosis Date Noted   Dehiscence of amputation stump ([place]) 05/04/2022    Priority: 1.   Essential hypertension 07/10/2015    Priority: 2.   Type II diabetes mellitus ([place]) 01/24/2010    Priority: 3.   Normocytic anemia 05/04/2022    Priority: 4.   Wound dehiscence, surgical, initial encounter 05/04/2022    Resolved Hospital Problems  No resolved problems to display.   Discharge Condition: Stable   Diet recommendation: Diet Orders (From admission, onward)     Start     Ordered   05/05/22 1830  Diet Heart Room service appropriate? Yes; Fluid consistency: Thin  Diet effective now       Question Answer Comment  Room service appropriate? Yes   Fluid consistency: Thin      05/05/22 1829           HPI and Brief Hospital Course:  This is a 62 year old gentleman with a past medical history significant for CAD status post PCI to the LAD, type 2 diabetes, hypothyroidism, hypertension, hyperlipidemia laboratory osteomyelitis status post BKA December 2023 who unfortunately presented due to leg injury after falling at home.  Patient underwent left AKA with vascular surgery Dr. Delana Meyer 1/16, due to wound and gangrene.  He was placed on empiric IV antibiotics, and has been converted to oral antibiotics today. He was examined by vascular surgery nurse practitioner today, who recommends the patient may be discharged home, patient was instructed to keep his wound intact with dressing in place until outpatient follow-up with vascular surgery in about 3 weeks.  In the meantime, patient has been  prescribed Bactrim to complete a total 7-day course of antibiotics.  Plan of care discussed in detail with the patient this morning, he understands the plan of care and is agreeable to discharge from the hospital today.  Procedures: AKA with vascular surgery on 1/16  Consultations: Vascular surgery  Discharge details, plan of care and follow up instructions were discussed with patient and any available family or care providers. Patient and family are in agreement with discharge from the hospital today and all questions were answered to their satisfaction.  Discharge Exam: BP (!) 159/97 (BP Location: Right Arm) Comment: Notified RN  Pulse 68   Temp 97.8 F (36.6 C) (Oral)   Resp 16   Ht '6\' 1"'$  (1.854 m)   Wt 120.2 kg   SpO2 97%   BMI 34.96 kg/m  General:  Alert, oriented, calm, in no acute distress  Eyes: EOMI, clear sclerea Neck: supple, no masses, trachea mildline  Cardiovascular: RRR, no murmurs or rubs, no peripheral edema, status post left AKA Respiratory: clear to auscultation bilaterally, no wheezes, no crackles  Abdomen: soft, nontender, nondistended, normal bowel tones heard  Skin: dry, no rashes  Musculoskeletal: no joint effusions, normal range of motion  Psychiatric: appropriate affect, normal speech  Neurologic: extraocular muscles intact, clear speech, moving all extremities with intact sensorium   Discharge Instructions You were cared for by a hospitalist during your hospital stay. If you have any questions about your discharge medications or the care you received while you were in the  hospital after you are discharged, you can call the unit and asked to speak with the hospitalist on call if the hospitalist that took care of you is not available. Once you are discharged, your primary care physician will handle any further medical issues. Please note that NO REFILLS for any discharge medications will be authorized once you are discharged, as it is imperative that you  return to your primary care physician (or establish a relationship with a primary care physician if you do not have one) for your aftercare needs so that they can reassess your need for medications and monitor your lab values.   Allergies as of 05/08/2022       Reactions   Lisinopril Cough        Medication List     STOP taking these medications    cephALEXin 500 MG capsule Commonly known as: KEFLEX   oxyCODONE-acetaminophen 5-325 MG tablet Commonly known as: Percocet       TAKE these medications    albuterol 108 (90 Base) MCG/ACT inhaler Commonly known as: VENTOLIN HFA Inhale 2 puffs into the lungs every 6 (six) hours as needed for wheezing or shortness of breath.   amLODipine 5 MG tablet Commonly known as: NORVASC Take 1 tablet (5 mg total) by mouth daily.   ascorbic acid 500 MG tablet Commonly known as: VITAMIN C Take 1 tablet (500 mg total) by mouth daily.   atorvastatin 40 MG tablet Commonly known as: LIPITOR Take one tab at bed time for cholesterol What changed:  how much to take how to take this when to take this   buPROPion 150 MG 24 hr tablet Commonly known as: WELLBUTRIN XL TAKE 1 TABLET BY MOUTH DAILY.   carvedilol 3.125 MG tablet Commonly known as: COREG TAKE ONE (1) TABLET BY MOUTH TWO TIMES PER DAY WITH MEALS What changed: See the new instructions.   DULoxetine 60 MG capsule Commonly known as: CYMBALTA TAKE 1 CAPSULE BY MOUTH DAILY.   levothyroxine 75 MCG tablet Commonly known as: SYNTHROID TAKE ONE TABLET BY MOUTH EVERY DAY WITH BREAKFAST   lisdexamfetamine 40 MG capsule Commonly known as: Vyvanse Take 1 capsule (40 mg total) by mouth every morning. Start taking on: May 15, 2022   metaxalone 800 MG tablet Commonly known as: SKELAXIN Take 1,600 mg by mouth 3 (three) times daily.   oxyCODONE 15 MG immediate release tablet Commonly known as: ROXICODONE Take 1 tablet (15 mg total) by mouth every 6 (six) hours as needed for  pain. What changed:  when to take this reasons to take this Another medication with the same name was removed. Continue taking this medication, and follow the directions you see here.   senna 8.6 MG Tabs tablet Commonly known as: SENOKOT Take 1 tablet (8.6 mg total) by mouth daily as needed for mild constipation.   sildenafil 20 MG tablet Commonly known as: REVATIO TAKE 1 TABLET BY MOUTH DAILY AS NEEDED FOR ERECTILE DYSFUNCTION   sulfamethoxazole-trimethoprim 800-160 MG tablet Commonly known as: BACTRIM DS Take 1 tablet by mouth every 12 (twelve) hours for 5 days.   vitamin D3 25 MCG tablet Commonly known as: CHOLECALCIFEROL Take 2 tablets (2,000 Units total) by mouth daily.   zinc sulfate 220 (50 Zn) MG capsule Take 1 capsule (220 mg total) by mouth daily.   zolpidem 10 MG tablet Commonly known as: AMBIEN Take 1 tablet (10 mg total) by mouth at bedtime as needed. for sleep Start taking on: May 15, 2022  Durable Medical Equipment  (From admission, onward)           Start     Ordered   05/06/22 1247  For home use only DME high strength lightweight manual wheelchair with seat cushion  Once       Comments: Patient suffers from L above the knee amputation which impairs their ability to perform daily activities like bathing, dressing, and grooming in the home.  A cane will not resolve  issue with performing activities of daily living. A wheelchair will allow patient to safely perform daily activities.Length of need Lifetime. (THEN ONE OF THESE TWO:) Patient self-propels the wheelchair while engaging in frequent activities such as laundry, meals, and toileting which cannot be performed in a standard or lightweight wheelchair due to the weight of the chair. Accessories: elevating leg rests (ELRs), wheel locks, extensions and anti-tippers.   05/06/22 1247           Allergies  Allergen Reactions   Lisinopril Cough    Follow-up Information      McDonough, Si Gaul, PA-C Follow up in 2 week(s).   Specialty: Physician Assistant Contact information: [place] [id number] [phone]         Drema Pry, NP Follow up in 3 week(s).   Specialty: Vascular Surgery Contact information: [place] [address] [phone]                 The results of significant diagnostics from this hospitalization (including imaging, microbiology, ancillary and laboratory) are listed below for reference.    Significant Diagnostic Studies: DG Chest Port 1 View  Result Date: 04/17/2022 CLINICAL DATA:  Cough, postop left below-the-knee amputation, fever. EXAM: PORTABLE CHEST 1 VIEW COMPARISON:  11/15/2020 and CT chest 07/09/2015. FINDINGS: Trachea is midline. Heart size stable. Lungs are clear. No pleural fluid. Postoperative changes in the spine. Spinal stimulator wires in place. Old bilateral rib fractures. IMPRESSION: No acute findings. Electronically Signed   By: Lorin Picket M.D.   On: 04/17/2022 11:58   [place] OR NERVE BLOCK-IMAGE ONLY [facility])  Result Date: 04/09/2022 There is no interpretation for this exam.  This order is for images obtained during a surgical procedure.  Please See "Surgeries" Tab for more information regarding the procedure.    Microbiology: No results found for this or any previous visit (from the past 240 hour(s)).   Labs: Basic Metabolic Panel: Recent Labs  Lab 05/04/22 1346 05/05/22 0201 05/06/22 0414 05/07/22 1101 05/08/22 0505  NA 137 139 139 141 138  K 4.0 3.9 4.3 4.0 4.2  CL 104 107 108 108 110  CO2 '24 26 25 30 22  '$ GLUCOSE 126* 92 102* 113* 98  BUN '17 16 12 9 14  '$ CREATININE 0.96 1.01 0.88 0.83 0.94  CALCIUM 8.6* 8.2* 8.2* 8.4* 7.8*   Liver Function Tests: Recent Labs  Lab 05/04/22 1346  AST 23  ALT 12  ALKPHOS 86  BILITOT 0.7  PROT 6.6  ALBUMIN 3.0*   No results for input(s): "LIPASE", "AMYLASE" in the last 168 hours. No results for  input(s): "AMMONIA" in the last 168 hours. CBC: Recent Labs  Lab 05/04/22 1346 05/05/22 0201 05/06/22 0414 05/07/22 1101 05/08/22 0505  WBC 3.9* 4.3 5.3 4.3 5.1  NEUTROABS 2.2 1.7  --   --   --   HGB 7.1* 6.1* 7.9* 7.9* 8.1*  HCT 24.0* 21.7* 26.6* 27.5* 27.9*  MCV 79.7* 81.6 81.6 83.6 83.5  PLT 412*  363 381 317 328   Cardiac Enzymes: No results for input(s): "CKTOTAL", "CKMB", "CKMBINDEX", "TROPONINI" in the last 168 hours. BNP: BNP (last 3 results) No results for input(s): "BNP" in the last 8760 hours.  ProBNP (last 3 results) No results for input(s): "PROBNP" in the last 8760 hours.  CBG: No results for input(s): "GLUCAP" in the last 168 hours.  Time spent: > 30 minutes were spent in preparing this discharge including medication reconciliation, counseling, and coordination of care.  Signed:  Rhealyn Cullen Marry Guan, MD  [facility] 05/08/2022, 3:20 PM

## 2022-05-12 ENCOUNTER — Other Ambulatory Visit (INDEPENDENT_AMBULATORY_CARE_PROVIDER_SITE_OTHER): Payer: Self-pay | Admitting: Nurse Practitioner

## 2022-05-12 NOTE — Telephone Encounter (Signed)
Patient called and stated he needed more pain medication.  He stated he is out.  Please confirm.

## 2022-05-13 NOTE — Telephone Encounter (Signed)
Pt called in again for pain medication.

## 2022-05-14 ENCOUNTER — Other Ambulatory Visit (INDEPENDENT_AMBULATORY_CARE_PROVIDER_SITE_OTHER): Payer: Self-pay | Admitting: Nurse Practitioner

## 2022-05-14 MED ORDER — OXYCODONE-ACETAMINOPHEN 5-325 MG PO TABS: 1.0000 | ORAL_TABLET | Freq: Three times a day (TID) | ORAL | 0 refills | Status: AC | PRN

## 2022-05-14 NOTE — Telephone Encounter (Signed)
He is tricky in that he has chronic pain management and he gets 15 mg of oxycodone that should last him 30 days.  So we are going to give for break through pain

## 2022-05-15 ENCOUNTER — Telehealth (INDEPENDENT_AMBULATORY_CARE_PROVIDER_SITE_OTHER): Payer: Self-pay | Admitting: Vascular Surgery

## 2022-05-15 NOTE — Telephone Encounter (Signed)
The patient was sent in oxycodone and acetominophen (5 mg).  He also has 15 mg of oxycodone prescribed to him that based on the [place] narcotic records, he picked up earlier this month and it should last him a month.  He can try to double up on the pills that we sent in but I will not send in additional medication

## 2022-05-15 NOTE — Telephone Encounter (Signed)
I contacted the patient to give him the recommendations from Eulogio Ditch NP. Once the phone was answered all that came my way was curse words and yelling. Patient stated we lying and he had not picked up any medication from his pharmacy other than 5 tablets. I advised that I was given the recommendation from the NP and that I will not be cursed or yelled at the patient hung up the phone.

## 2022-05-15 NOTE — Telephone Encounter (Signed)
Patient called in upset about his medication, he states that he needs something stronger than the tylenol that he is receiving. I confirmed with the NP Curly Rim that patient is indeed not getting tylenol but percocet for pain medication. Patients states that he just had his leg amputated and tylenol is not going to cut it or is helping, its like taking candy. He 's been going through this pain for 5 days and this is just not right he needs real pain medication or he's going to end up in the hospital. I let patient know that I would send message to provider and see what if anything else could be done.    Please call and advise

## 2022-05-19 ENCOUNTER — Telehealth: Payer: Self-pay

## 2022-05-19 NOTE — Telephone Encounter (Signed)
Patient approved for Zolpidem.

## 2022-05-20 ENCOUNTER — Other Ambulatory Visit: Payer: Self-pay | Admitting: Physician Assistant

## 2022-06-01 ENCOUNTER — Telehealth: Payer: Self-pay

## 2022-06-01 NOTE — Telephone Encounter (Signed)
Mychart msg sent. AS, CMA

## 2022-06-03 ENCOUNTER — Telehealth: Payer: Self-pay | Admitting: Physician Assistant

## 2022-06-03 NOTE — Telephone Encounter (Signed)
62 pages of MR faxed to [place]; [phone]

## 2022-06-09 ENCOUNTER — Other Ambulatory Visit: Payer: Self-pay | Admitting: Physician Assistant

## 2022-06-09 DIAGNOSIS — G4719 Other hypersomnia: Secondary | ICD-10-CM

## 2022-06-09 MED ORDER — LISDEXAMFETAMINE DIMESYLATE 40 MG PO CAPS: 40.0000 mg | ORAL_CAPSULE | ORAL | 0 refills | Status: DC

## 2022-06-09 NOTE — Telephone Encounter (Signed)
Next4/24

## 2022-06-11 ENCOUNTER — Ambulatory Visit (INDEPENDENT_AMBULATORY_CARE_PROVIDER_SITE_OTHER): Payer: Medicaid Other | Admitting: Nurse Practitioner

## 2022-06-11 ENCOUNTER — Encounter (INDEPENDENT_AMBULATORY_CARE_PROVIDER_SITE_OTHER): Payer: Self-pay

## 2022-06-18 ENCOUNTER — Telehealth: Payer: Self-pay

## 2022-06-18 NOTE — Telephone Encounter (Signed)
Completed P.A. for patient's Vyvanse.

## 2022-07-09 ENCOUNTER — Telehealth (INDEPENDENT_AMBULATORY_CARE_PROVIDER_SITE_OTHER): Payer: Self-pay | Admitting: Nurse Practitioner

## 2022-07-09 NOTE — Telephone Encounter (Signed)
This is correct, he needs to be seen so that we can send the referral and notes to the prosthetic company

## 2022-07-09 NOTE — Telephone Encounter (Signed)
He needs to have a follow up appt to be seen. He hasn't had an appt since his amputation. 1 cancellation and a no show,

## 2022-07-09 NOTE — Telephone Encounter (Signed)
Patient called regarding his prosthetic leg. He wants to know when he should be getting it.  He states his truck had been repossessed and they are about to take his home if he can't work. Please advise.

## 2022-07-14 NOTE — Telephone Encounter (Signed)
schneduled

## 2022-07-23 ENCOUNTER — Encounter (INDEPENDENT_AMBULATORY_CARE_PROVIDER_SITE_OTHER): Payer: Self-pay | Admitting: Nurse Practitioner

## 2022-07-23 ENCOUNTER — Ambulatory Visit (INDEPENDENT_AMBULATORY_CARE_PROVIDER_SITE_OTHER): Payer: Medicaid Other | Admitting: Nurse Practitioner

## 2022-07-23 VITALS — BP 112/70 | Resp 18 | Ht 73.0 in | Wt 250.0 lb

## 2022-07-23 DIAGNOSIS — Z89612 Acquired absence of left leg above knee: Secondary | ICD-10-CM

## 2022-07-30 ENCOUNTER — Encounter: Payer: Medicaid Other | Admitting: Physician Assistant

## 2022-08-03 ENCOUNTER — Ambulatory Visit (INDEPENDENT_AMBULATORY_CARE_PROVIDER_SITE_OTHER): Payer: Medicaid Other | Admitting: Physician Assistant

## 2022-08-03 ENCOUNTER — Encounter: Payer: Self-pay | Admitting: Physician Assistant

## 2022-08-03 VITALS — BP 146/93 | HR 71 | Temp 98.3°F | Resp 16 | Ht 73.0 in | Wt 260.0 lb

## 2022-08-03 DIAGNOSIS — R5383 Other fatigue: Secondary | ICD-10-CM

## 2022-08-03 DIAGNOSIS — G4719 Other hypersomnia: Secondary | ICD-10-CM

## 2022-08-03 DIAGNOSIS — H539 Unspecified visual disturbance: Secondary | ICD-10-CM | POA: Diagnosis not present

## 2022-08-03 DIAGNOSIS — Z0001 Encounter for general adult medical examination with abnormal findings: Secondary | ICD-10-CM

## 2022-08-03 DIAGNOSIS — E559 Vitamin D deficiency, unspecified: Secondary | ICD-10-CM

## 2022-08-03 DIAGNOSIS — E039 Hypothyroidism, unspecified: Secondary | ICD-10-CM

## 2022-08-03 DIAGNOSIS — Z89512 Acquired absence of left leg below knee: Secondary | ICD-10-CM | POA: Diagnosis not present

## 2022-08-03 DIAGNOSIS — E1165 Type 2 diabetes mellitus with hyperglycemia: Secondary | ICD-10-CM | POA: Diagnosis not present

## 2022-08-03 DIAGNOSIS — E782 Mixed hyperlipidemia: Secondary | ICD-10-CM

## 2022-08-03 DIAGNOSIS — I10 Essential (primary) hypertension: Secondary | ICD-10-CM | POA: Diagnosis not present

## 2022-08-03 DIAGNOSIS — E538 Deficiency of other specified B group vitamins: Secondary | ICD-10-CM

## 2022-08-03 DIAGNOSIS — F5101 Primary insomnia: Secondary | ICD-10-CM

## 2022-08-03 DIAGNOSIS — Z794 Long term (current) use of insulin: Secondary | ICD-10-CM

## 2022-08-03 LAB — POCT GLYCOSYLATED HEMOGLOBIN (HGB A1C): Hemoglobin A1C: 5.1 % (ref 4.0–5.6)

## 2022-08-03 MED ORDER — ZOLPIDEM TARTRATE 10 MG PO TABS: 10.0000 mg | ORAL_TABLET | Freq: Every evening | ORAL | 2 refills | Status: DC | PRN

## 2022-08-03 MED ORDER — LISDEXAMFETAMINE DIMESYLATE 40 MG PO CAPS: 40.0000 mg | ORAL_CAPSULE | ORAL | 0 refills | Status: DC
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
## 2022-08-12 ENCOUNTER — Other Ambulatory Visit: Payer: Self-pay | Admitting: Physician Assistant

## 2022-08-12 DIAGNOSIS — E782 Mixed hyperlipidemia: Secondary | ICD-10-CM

## 2022-08-12 DIAGNOSIS — F331 Major depressive disorder, recurrent, moderate: Secondary | ICD-10-CM

## 2022-08-12 DIAGNOSIS — F411 Generalized anxiety disorder: Secondary | ICD-10-CM

## 2022-08-12 DIAGNOSIS — F329 Major depressive disorder, single episode, unspecified: Secondary | ICD-10-CM

## 2022-08-16 ENCOUNTER — Encounter (INDEPENDENT_AMBULATORY_CARE_PROVIDER_SITE_OTHER): Payer: Self-pay | Admitting: Nurse Practitioner
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
## 2022-09-08 ENCOUNTER — Inpatient Hospital Stay
Admission: EM | Admit: 2022-09-08 | Discharge: 2022-09-11 | DRG: 623 | Disposition: A | Discharge status: Home / Self Care | Payer: Medicaid Other | Attending: Obstetrics and Gynecology | Admitting: Obstetrics and Gynecology

## 2022-09-08 ENCOUNTER — Encounter: Payer: Self-pay | Admitting: Emergency Medicine

## 2022-09-08 ENCOUNTER — Emergency Department: Payer: Medicaid Other

## 2022-09-08 ENCOUNTER — Other Ambulatory Visit: Payer: Self-pay

## 2022-09-08 DIAGNOSIS — Z89512 Acquired absence of left leg below knee: Secondary | ICD-10-CM

## 2022-09-08 DIAGNOSIS — Z888 Allergy status to other drugs, medicaments and biological substances status: Secondary | ICD-10-CM

## 2022-09-08 DIAGNOSIS — F419 Anxiety disorder, unspecified: Secondary | ICD-10-CM | POA: Diagnosis present

## 2022-09-08 DIAGNOSIS — Z955 Presence of coronary angioplasty implant and graft: Secondary | ICD-10-CM

## 2022-09-08 DIAGNOSIS — Z9852 Vasectomy status: Secondary | ICD-10-CM

## 2022-09-08 DIAGNOSIS — K219 Gastro-esophageal reflux disease without esophagitis: Secondary | ICD-10-CM | POA: Diagnosis present

## 2022-09-08 DIAGNOSIS — E039 Hypothyroidism, unspecified: Secondary | ICD-10-CM | POA: Diagnosis present

## 2022-09-08 DIAGNOSIS — E11628 Type 2 diabetes mellitus with other skin complications: Secondary | ICD-10-CM | POA: Diagnosis present

## 2022-09-08 DIAGNOSIS — G4700 Insomnia, unspecified: Secondary | ICD-10-CM | POA: Diagnosis present

## 2022-09-08 DIAGNOSIS — Z5982 Transportation insecurity: Secondary | ICD-10-CM

## 2022-09-08 DIAGNOSIS — Z86010 Personal history of colonic polyps: Secondary | ICD-10-CM

## 2022-09-08 DIAGNOSIS — L089 Local infection of the skin and subcutaneous tissue, unspecified: Secondary | ICD-10-CM | POA: Diagnosis present

## 2022-09-08 DIAGNOSIS — R6520 Severe sepsis without septic shock: Secondary | ICD-10-CM

## 2022-09-08 DIAGNOSIS — E11621 Type 2 diabetes mellitus with foot ulcer: Principal | ICD-10-CM | POA: Diagnosis present

## 2022-09-08 DIAGNOSIS — M109 Gout, unspecified: Secondary | ICD-10-CM | POA: Diagnosis present

## 2022-09-08 DIAGNOSIS — Z5941 Food insecurity: Secondary | ICD-10-CM

## 2022-09-08 DIAGNOSIS — G8929 Other chronic pain: Secondary | ICD-10-CM | POA: Diagnosis present

## 2022-09-08 DIAGNOSIS — I10 Essential (primary) hypertension: Secondary | ICD-10-CM | POA: Diagnosis present

## 2022-09-08 DIAGNOSIS — Z79890 Hormone replacement therapy (postmenopausal): Secondary | ICD-10-CM

## 2022-09-08 DIAGNOSIS — E785 Hyperlipidemia, unspecified: Secondary | ICD-10-CM | POA: Diagnosis present

## 2022-09-08 DIAGNOSIS — E872 Acidosis, unspecified: Secondary | ICD-10-CM | POA: Diagnosis present

## 2022-09-08 DIAGNOSIS — Z9682 Presence of neurostimulator: Secondary | ICD-10-CM

## 2022-09-08 DIAGNOSIS — Z9884 Bariatric surgery status: Secondary | ICD-10-CM

## 2022-09-08 DIAGNOSIS — I2510 Atherosclerotic heart disease of native coronary artery without angina pectoris: Secondary | ICD-10-CM | POA: Diagnosis present

## 2022-09-08 DIAGNOSIS — Z8711 Personal history of peptic ulcer disease: Secondary | ICD-10-CM

## 2022-09-08 DIAGNOSIS — Z87442 Personal history of urinary calculi: Secondary | ICD-10-CM

## 2022-09-08 DIAGNOSIS — L03115 Cellulitis of right lower limb: Secondary | ICD-10-CM | POA: Diagnosis present

## 2022-09-08 DIAGNOSIS — E669 Obesity, unspecified: Secondary | ICD-10-CM | POA: Diagnosis present

## 2022-09-08 DIAGNOSIS — N179 Acute kidney failure, unspecified: Secondary | ICD-10-CM | POA: Diagnosis present

## 2022-09-08 DIAGNOSIS — N400 Enlarged prostate without lower urinary tract symptoms: Secondary | ICD-10-CM | POA: Diagnosis present

## 2022-09-08 DIAGNOSIS — I252 Old myocardial infarction: Secondary | ICD-10-CM

## 2022-09-08 DIAGNOSIS — J45909 Unspecified asthma, uncomplicated: Secondary | ICD-10-CM | POA: Diagnosis present

## 2022-09-08 DIAGNOSIS — E1142 Type 2 diabetes mellitus with diabetic polyneuropathy: Secondary | ICD-10-CM | POA: Diagnosis present

## 2022-09-08 DIAGNOSIS — Z5986 Financial insecurity: Secondary | ICD-10-CM

## 2022-09-08 DIAGNOSIS — Z79899 Other long term (current) drug therapy: Secondary | ICD-10-CM

## 2022-09-08 DIAGNOSIS — F988 Other specified behavioral and emotional disorders with onset usually occurring in childhood and adolescence: Secondary | ICD-10-CM | POA: Diagnosis present

## 2022-09-08 DIAGNOSIS — D72819 Decreased white blood cell count, unspecified: Secondary | ICD-10-CM | POA: Diagnosis not present

## 2022-09-08 DIAGNOSIS — Z89421 Acquired absence of other right toe(s): Secondary | ICD-10-CM

## 2022-09-08 DIAGNOSIS — Z833 Family history of diabetes mellitus: Secondary | ICD-10-CM

## 2022-09-08 DIAGNOSIS — Z89612 Acquired absence of left leg above knee: Secondary | ICD-10-CM

## 2022-09-08 DIAGNOSIS — A419 Sepsis, unspecified organism: Secondary | ICD-10-CM

## 2022-09-08 DIAGNOSIS — N529 Male erectile dysfunction, unspecified: Secondary | ICD-10-CM | POA: Diagnosis present

## 2022-09-08 DIAGNOSIS — F32A Depression, unspecified: Secondary | ICD-10-CM | POA: Diagnosis present

## 2022-09-08 DIAGNOSIS — E1151 Type 2 diabetes mellitus with diabetic peripheral angiopathy without gangrene: Secondary | ICD-10-CM | POA: Diagnosis present

## 2022-09-08 DIAGNOSIS — E876 Hypokalemia: Secondary | ICD-10-CM | POA: Diagnosis present

## 2022-09-08 DIAGNOSIS — M549 Dorsalgia, unspecified: Secondary | ICD-10-CM | POA: Diagnosis present

## 2022-09-08 DIAGNOSIS — G4730 Sleep apnea, unspecified: Secondary | ICD-10-CM | POA: Diagnosis present

## 2022-09-08 DIAGNOSIS — Z6834 Body mass index (BMI) 34.0-34.9, adult: Secondary | ICD-10-CM

## 2022-09-08 DIAGNOSIS — L97519 Non-pressure chronic ulcer of other part of right foot with unspecified severity: Secondary | ICD-10-CM | POA: Diagnosis present

## 2022-09-08 DIAGNOSIS — Z8616 Personal history of COVID-19: Secondary | ICD-10-CM

## 2022-09-08 LAB — COMPREHENSIVE METABOLIC PANEL
ALT: 38 U/L (ref 0–44)
AST: 49 U/L — ABNORMAL HIGH (ref 15–41)
Albumin: 3.8 g/dL (ref 3.5–5.0)
Alkaline Phosphatase: 97 U/L (ref 38–126)
Anion gap: 16 — ABNORMAL HIGH (ref 5–15)
BUN: 15 mg/dL (ref 8–23)
CO2: 24 mmol/L (ref 22–32)
Calcium: 8.5 mg/dL — ABNORMAL LOW (ref 8.9–10.3)
Chloride: 96 mmol/L — ABNORMAL LOW (ref 98–111)
Creatinine, Ser: 1.49 mg/dL — ABNORMAL HIGH (ref 0.61–1.24)
GFR, Estimated: 53 mL/min — ABNORMAL LOW (ref >60)
Glucose, Bld: 100 mg/dL — ABNORMAL HIGH (ref 70–99)
Potassium: 2.7 mmol/L — CL (ref 3.5–5.1)
Sodium: 136 mmol/L (ref 135–145)
Total Bilirubin: 1 mg/dL (ref 0.3–1.2)
Total Protein: 7.9 g/dL (ref 6.5–8.1)

## 2022-09-08 LAB — CBC WITH DIFFERENTIAL/PLATELET
Abs Immature Granulocytes: 0.05 10*3/uL (ref 0.00–0.07)
Basophils Absolute: 0.1 10*3/uL (ref 0.0–0.1)
Basophils Relative: 0 %
Eosinophils Absolute: 0 10*3/uL (ref 0.0–0.5)
Eosinophils Relative: 0 %
HCT: 38.7 % — ABNORMAL LOW (ref 39.0–52.0)
Hemoglobin: 12 g/dL — ABNORMAL LOW (ref 13.0–17.0)
Immature Granulocytes: 0 %
Lymphocytes Relative: 15 %
Lymphs Abs: 1.6 10*3/uL (ref 0.7–4.0)
MCH: 25.9 pg — ABNORMAL LOW (ref 26.0–34.0)
MCHC: 31 g/dL (ref 30.0–36.0)
MCV: 83.6 fL (ref 80.0–100.0)
Monocytes Absolute: 1.1 10*3/uL — ABNORMAL HIGH (ref 0.1–1.0)
Monocytes Relative: 10 %
Neutro Abs: 8.4 10*3/uL — ABNORMAL HIGH (ref 1.7–7.7)
Neutrophils Relative %: 75 %
Platelets: 319 10*3/uL (ref 150–400)
RBC: 4.63 MIL/uL (ref 4.22–5.81)
RDW: 21 % — ABNORMAL HIGH (ref 11.5–15.5)
WBC: 11.2 10*3/uL — ABNORMAL HIGH (ref 4.0–10.5)
nRBC: 0 % (ref 0.0–0.2)

## 2022-09-08 LAB — LACTIC ACID, PLASMA: Lactic Acid, Venous: 4 mmol/L (ref 0.5–1.9)

## 2022-09-08 MED ORDER — MORPHINE SULFATE (PF) 4 MG/ML IV SOLN
4.0000 mg | Freq: Once | INTRAVENOUS | Status: AC
  Administered 2022-09-08: 4 mg via INTRAVENOUS
  Filled 2022-09-08: qty 1

## 2022-09-08 MED ORDER — LACTATED RINGERS IV BOLUS (SEPSIS)
1000.0000 mL | Freq: Once | INTRAVENOUS | Status: AC
  Administered 2022-09-08: 1000 mL via INTRAVENOUS

## 2022-09-08 MED ORDER — LACTATED RINGERS IV BOLUS (SEPSIS)
1000.0000 mL | Freq: Once | INTRAVENOUS | Status: AC
  Administered 2022-09-09: 1000 mL via INTRAVENOUS

## 2022-09-08 MED ORDER — METRONIDAZOLE 500 MG/100ML IV SOLN
500.0000 mg | Freq: Once | INTRAVENOUS | Status: AC
  Administered 2022-09-08: 500 mg via INTRAVENOUS
  Filled 2022-09-08: qty 100

## 2022-09-08 MED ORDER — VANCOMYCIN HCL 1500 MG/300ML IV SOLN
1500.0000 mg | Freq: Once | INTRAVENOUS | Status: AC
  Administered 2022-09-09: 1500 mg via INTRAVENOUS
  Filled 2022-09-08: qty 300

## 2022-09-08 MED ORDER — SODIUM CHLORIDE 0.9 % IV SOLN
2.0000 g | Freq: Once | INTRAVENOUS | Status: AC
  Administered 2022-09-08: 2 g via INTRAVENOUS
  Filled 2022-09-08: qty 12.5

## 2022-09-08 MED ORDER — VANCOMYCIN HCL IN DEXTROSE 1-5 GM/200ML-% IV SOLN
1000.0000 mg | Freq: Once | INTRAVENOUS | Status: DC
  Filled 2022-09-08: qty 200

## 2022-09-08 MED ORDER — VANCOMYCIN HCL IN DEXTROSE 1-5 GM/200ML-% IV SOLN
1000.0000 mg | Freq: Once | INTRAVENOUS | Status: AC
  Administered 2022-09-09: 1000 mg via INTRAVENOUS
  Filled 2022-09-08: qty 200

## 2022-09-08 MED ORDER — POTASSIUM CHLORIDE 10 MEQ/100ML IV SOLN
10.0000 meq | INTRAVENOUS | Status: AC
  Administered 2022-09-09 (×2): 10 meq via INTRAVENOUS
  Filled 2022-09-08 (×2): qty 100

## 2022-09-08 MED ORDER — POTASSIUM CHLORIDE CRYS ER 20 MEQ PO TBCR
40.0000 meq | EXTENDED_RELEASE_TABLET | Freq: Once | ORAL | Status: AC
  Administered 2022-09-08: 40 meq via ORAL
  Filled 2022-09-08: qty 2

## 2022-09-08 NOTE — ED Triage Notes (Signed)
Pt presents ambulatory to triage via POV with complaints of R foot pain - pt has a chronic wound on his R foot where his toes were amputated. Pt has a dark colored wound to the sole of his foot; no drainage noted.  A&Ox4 at this time. Denies CP or SOB.

## 2022-09-08 NOTE — Progress Notes (Signed)
CODE SEPSIS - PHARMACY COMMUNICATION  **Broad Spectrum Antibiotics should be administered within 1 hour of Sepsis diagnosis**  Time Code Sepsis Called/Page Received: 5/21 @ 2245  Antibiotics Ordered: Vanc, Cefepime   Time of 1st antibiotic administration: Cefepime 2 gm IV X 1 on 5/21 @ 2255   Additional action taken by pharmacy:   If necessary, Name of Provider/Nurse Contacted:     Macedonio Scallon D ,PharmD Clinical Pharmacist  09/08/2022  11:06 PM

## 2022-09-08 NOTE — Sepsis Progress Note (Signed)
Elink monitoring for the code sepsis protocol.  

## 2022-09-08 NOTE — ED Provider Triage Note (Signed)
Emergency Medicine Provider Triage Evaluation Note  Charles [name] , a 62 y.o. male  was evaluated in triage.  Pt complains of R foot wound with increasing redness. L BKA and toes amputed from R foot. Wound is to R side. Fever at home. Concern for osteomyelitis.  Review of Systems  Positive: Foot wound R  foot, fever Negative: CP, shob, URI symptoms  Physical Exam  BP 106/87   Pulse (!) 131   Temp 99 F (37.2 C) (Oral)   Resp 18   Ht 6\' 1"  (1.854 m)   Wt 120.2 kg   SpO2 100%   BMI 34.96 kg/m  Gen:   Awake, no distress   Resp:  Normal effort  MSK:   Moves extremities without difficulty. Toes amputed to R foot, foot is edematous, erythematous Other:    Medical Decision Making  Medically screening exam initiated at 7:16 PM.  Appropriate orders placed.  Charles [name] was informed that the remainder of the evaluation will be completed by another provider, this initial triage assessment does not replace that evaluation, and the importance of remaining in the ED until their evaluation is complete.  Labs, cultures, xray   Lanette Hampshire 09/08/22 1919

## 2022-09-08 NOTE — Progress Notes (Signed)
PHARMACY -  BRIEF ANTIBIOTIC NOTE   Pharmacy has received consult(s) for Vanc, Cefepime  from an ED provider.  The patient's profile has been reviewed for ht/wt/allergies/indication/available labs.    One time order(s) placed for Vancomycin 2500 mg IV X 1 and Cefepime 2 gm IV X 1  Further antibiotics/pharmacy consults should be ordered by admitting physician if indicated.                       Thank you, Shyteria Lewis D 09/08/2022  11:07 PM

## 2022-09-08 NOTE — ED Provider Notes (Signed)
[facility] Provider Note    Event Date/Time   First MD Initiated Contact with Patient 09/08/22 2200     (approximate)   History   Foot Pain   HPI  Charles [name] is a 62 y.o. male with history of hypertension, hyperlipidemia, left AKA, right transmetatarsal amputation presenting to the emergency department for evaluation of foot pain.  Patient reports about 10 days ago he began to develop pain in his right foot.  This has been progressively worsening.  Tmax 99 at home.  He has noticed redness and swelling of his foot.  No drainage noted.     Physical Exam   Triage Vital Signs: ED Triage Vitals  Enc Vitals Group     BP 09/08/22 1912 106/87     Pulse Rate 09/08/22 1912 (!) 131     Resp 09/08/22 1912 18     Temp 09/08/22 1912 99 F (37.2 C)     Temp Source 09/08/22 1912 Oral     SpO2 09/08/22 1912 100 %     Weight 09/08/22 1911 265 lb (120.2 kg)     Height 09/08/22 1911 6\' 1"  (1.854 m)     Head Circumference --      Peak Flow --      Pain Score 09/08/22 1911 9     Pain Loc --      Pain Edu? --      Excl. in GC? --     Most recent vital signs: Vitals:   09/08/22 2245 09/09/22 0002  BP: 129/88 134/89  Pulse: (!) 123 (!) 103  Resp: 16 16  Temp:    SpO2: 95% 93%     General: Awake, interactive  CV:  Tachycardic with regular rhythm, normal peripheral perfusion Resp:  Lungs clear, unlabored respirations.  Abd:  Soft, nondistended.  Neuro:  Symmetric facial movement, fluid speech MSK:  Left AKA without erythema, warmth, tenderness, well-healed scar.  Right foot appears swollen, though no contralateral foot to compare to.  There is some erythema over the foot with significant tenderness to palpation.  There is some mild tenderness that extends into the calf area.  There is a shallow ulcer over the plantar aspect of the foot without significant surrounding erythema or purulent drainage.   ED Results / Procedures / Treatments   Labs (all  labs ordered are listed, but only abnormal results are displayed) Labs Reviewed  CBC WITH DIFFERENTIAL/PLATELET - Abnormal; Notable for the following components:      Result Value   WBC 11.2 (*)    Hemoglobin 12.0 (*)    HCT 38.7 (*)    MCH 25.9 (*)    RDW 21.0 (*)    Neutro Abs 8.4 (*)    Monocytes Absolute 1.1 (*)    All other components within normal limits  COMPREHENSIVE METABOLIC PANEL - Abnormal; Notable for the following components:   Potassium 2.7 (*)    Chloride 96 (*)    Glucose, Bld 100 (*)    Creatinine, Ser 1.49 (*)    Calcium 8.5 (*)    AST 49 (*)    GFR, Estimated 53 (*)    Anion gap 16 (*)    All other components within normal limits  LACTIC ACID, PLASMA - Abnormal; Notable for the following components:   Lactic Acid, Venous 4.0 (*)    All other components within normal limits  CULTURE, BLOOD (ROUTINE X 2)  CULTURE, BLOOD (ROUTINE X 2)  LACTIC ACID, PLASMA  EKG EKG independently reviewed interpreted by myself (ER attending) demonstrates:    RADIOLOGY Imaging independently reviewed and interpreted by myself demonstrates:  Foot x-Josefa [place] reviewed demonstrating prior amputation, no obvious osteomyelitis  PROCEDURES:  Critical Care performed: Yes, see critical care procedure note(s)  CRITICAL CARE Performed by: Trinna Post   Total critical care time: 30 minutes  Critical care time was exclusive of separately billable procedures and treating other patients.  Critical care was necessary to treat or prevent imminent or life-threatening deterioration.  Critical care was time spent personally by me on the following activities: development of treatment plan with patient and/or surrogate as well as nursing, discussions with consultants, evaluation of patient's response to treatment, examination of patient, obtaining history from patient or surrogate, ordering and performing treatments and interventions, ordering and review of laboratory studies, ordering and  review of radiographic studies, pulse oximetry and re-evaluation of patient's condition.   Procedures   MEDICATIONS ORDERED IN ED: Medications  lactated ringers bolus 1,000 mL (1,000 mLs Intravenous New Bag/Given 09/08/22 2320)    And  lactated ringers bolus 1,000 mL (1,000 mLs Intravenous New Bag/Given 09/08/22 2321)    And  lactated ringers bolus 1,000 mL (has no administration in time range)    And  lactated ringers bolus 1,000 mL (has no administration in time range)  vancomycin (VANCOCIN) IVPB 1000 mg/200 mL premix (has no administration in time range)    Followed by  vancomycin (VANCOREADY) IVPB 1500 mg/300 mL (has no administration in time range)  potassium chloride 10 mEq in 100 mL IVPB (10 mEq Intravenous New Bag/Given 09/09/22 0001)  ceFEPIme (MAXIPIME) 2 g in sodium chloride 0.9 % 100 mL IVPB (0 g Intravenous Stopped 09/08/22 2322)  metroNIDAZOLE (FLAGYL) IVPB 500 mg (0 mg Intravenous Stopped 09/09/22 0021)  morphine (PF) 4 MG/ML injection 4 mg (4 mg Intravenous Given 09/08/22 2315)  potassium chloride SA (KLOR-CON M) CR tablet 40 mEq (40 mEq Oral Given 09/08/22 2353)     IMPRESSION / MDM / ASSESSMENT AND PLAN / ED COURSE  I reviewed the triage vital signs and the nursing notes.  Differential diagnosis includes, but is not limited to, sepsis secondary to cellulitis, osteomyelitis, other infectious source, arrhythmia, anemia, electrolyte abnormality  Patient's presentation is most consistent with acute presentation with potential threat to life or bodily function.  62 year old male presenting to the emergency department with right foot pain found to be tachycardic on presentation.  Does appear to have at least a cellulitis of his foot given redness, warmth, tenderness.  His presentation is concerning for sepsis with leukocytosis of WBC of 11.2, lactate of 4.  Does also have hypokalemia with a K of 2.7, acute kidney injury with creatinine of 1.49, previously 0.94.  Given lactic  acidosis, 30 cc/kg of fluid were ordered.  He was also ordered for broad-spectrum antibiotics with cefepime, vancomycin, and Flagyl.  Will reach out to hospitalist team to discuss admission.  Case discussed with Dr. Maisie Fus.  She will evaluate the patient for anticipated admission.      FINAL CLINICAL IMPRESSION(S) / ED DIAGNOSES   Final diagnoses:  Right foot infection  Sepsis with acute organ dysfunction, due to unspecified organism, unspecified organ dysfunction type, unspecified whether septic shock present (HCC)  Acute kidney injury (HCC)     Rx / DC Orders   ED Discharge Orders     None        Note:  This document was prepared using Dragon voice recognition software and may include  unintentional dictation errors.   Trinna Post, MD 09/09/22 [phone]

## 2022-09-08 NOTE — ED Triage Notes (Signed)
First Nurse Note;  Pt via EMS from home. Pt is amputee on L, and toes amputated on R. Pt has a wound R foot. Pt is diabetic. Pt is A&Ox4 and NAD 20G L AC, fluid given 119 CBG 99.1  132 HR  130/90 BP

## 2022-09-08 NOTE — ED Notes (Signed)
Lab reports lactic of 4.0; acuity level changed and will take pt to next available exam room

## 2022-09-09 ENCOUNTER — Encounter: Payer: Self-pay | Admitting: Internal Medicine

## 2022-09-09 ENCOUNTER — Inpatient Hospital Stay: Payer: Medicaid Other

## 2022-09-09 ENCOUNTER — Other Ambulatory Visit: Payer: Self-pay

## 2022-09-09 DIAGNOSIS — Z89612 Acquired absence of left leg above knee: Secondary | ICD-10-CM | POA: Diagnosis not present

## 2022-09-09 DIAGNOSIS — N179 Acute kidney failure, unspecified: Secondary | ICD-10-CM | POA: Diagnosis present

## 2022-09-09 DIAGNOSIS — E11621 Type 2 diabetes mellitus with foot ulcer: Secondary | ICD-10-CM | POA: Diagnosis present

## 2022-09-09 DIAGNOSIS — L03115 Cellulitis of right lower limb: Secondary | ICD-10-CM | POA: Diagnosis not present

## 2022-09-09 DIAGNOSIS — E669 Obesity, unspecified: Secondary | ICD-10-CM | POA: Diagnosis present

## 2022-09-09 DIAGNOSIS — E039 Hypothyroidism, unspecified: Secondary | ICD-10-CM | POA: Diagnosis present

## 2022-09-09 DIAGNOSIS — E1142 Type 2 diabetes mellitus with diabetic polyneuropathy: Secondary | ICD-10-CM | POA: Diagnosis present

## 2022-09-09 DIAGNOSIS — E1151 Type 2 diabetes mellitus with diabetic peripheral angiopathy without gangrene: Secondary | ICD-10-CM | POA: Diagnosis present

## 2022-09-09 DIAGNOSIS — E11628 Type 2 diabetes mellitus with other skin complications: Secondary | ICD-10-CM | POA: Diagnosis present

## 2022-09-09 DIAGNOSIS — M549 Dorsalgia, unspecified: Secondary | ICD-10-CM | POA: Diagnosis present

## 2022-09-09 DIAGNOSIS — I10 Essential (primary) hypertension: Secondary | ICD-10-CM | POA: Diagnosis present

## 2022-09-09 DIAGNOSIS — L089 Local infection of the skin and subcutaneous tissue, unspecified: Secondary | ICD-10-CM | POA: Diagnosis present

## 2022-09-09 DIAGNOSIS — Z8616 Personal history of COVID-19: Secondary | ICD-10-CM | POA: Diagnosis not present

## 2022-09-09 DIAGNOSIS — F32A Depression, unspecified: Secondary | ICD-10-CM | POA: Diagnosis present

## 2022-09-09 DIAGNOSIS — F988 Other specified behavioral and emotional disorders with onset usually occurring in childhood and adolescence: Secondary | ICD-10-CM | POA: Diagnosis present

## 2022-09-09 DIAGNOSIS — J45909 Unspecified asthma, uncomplicated: Secondary | ICD-10-CM | POA: Diagnosis present

## 2022-09-09 DIAGNOSIS — E872 Acidosis, unspecified: Secondary | ICD-10-CM | POA: Diagnosis present

## 2022-09-09 DIAGNOSIS — E785 Hyperlipidemia, unspecified: Secondary | ICD-10-CM | POA: Diagnosis present

## 2022-09-09 DIAGNOSIS — L97519 Non-pressure chronic ulcer of other part of right foot with unspecified severity: Secondary | ICD-10-CM | POA: Diagnosis present

## 2022-09-09 DIAGNOSIS — D72819 Decreased white blood cell count, unspecified: Secondary | ICD-10-CM | POA: Diagnosis not present

## 2022-09-09 DIAGNOSIS — Z89512 Acquired absence of left leg below knee: Secondary | ICD-10-CM | POA: Diagnosis not present

## 2022-09-09 DIAGNOSIS — F419 Anxiety disorder, unspecified: Secondary | ICD-10-CM | POA: Diagnosis present

## 2022-09-09 DIAGNOSIS — N529 Male erectile dysfunction, unspecified: Secondary | ICD-10-CM | POA: Diagnosis present

## 2022-09-09 DIAGNOSIS — E876 Hypokalemia: Secondary | ICD-10-CM | POA: Diagnosis present

## 2022-09-09 LAB — CBC
HCT: 32.7 % — ABNORMAL LOW (ref 39.0–52.0)
Hemoglobin: 10.2 g/dL — ABNORMAL LOW (ref 13.0–17.0)
MCH: 26.2 pg (ref 26.0–34.0)
MCHC: 31.2 g/dL (ref 30.0–36.0)
MCV: 84.1 fL (ref 80.0–100.0)
Platelets: 221 10*3/uL (ref 150–400)
RBC: 3.89 MIL/uL — ABNORMAL LOW (ref 4.22–5.81)
RDW: 21 % — ABNORMAL HIGH (ref 11.5–15.5)
WBC: 7.4 10*3/uL (ref 4.0–10.5)
nRBC: 0 % (ref 0.0–0.2)

## 2022-09-09 LAB — LACTIC ACID, PLASMA: Lactic Acid, Venous: 2.9 mmol/L (ref 0.5–1.9)

## 2022-09-09 LAB — HIV ANTIBODY (ROUTINE TESTING W REFLEX): HIV Screen 4th Generation wRfx: NONREACTIVE

## 2022-09-09 LAB — CREATININE, SERUM
Creatinine, Ser: 1.35 mg/dL — ABNORMAL HIGH (ref 0.61–1.24)
GFR, Estimated: 59 mL/min — ABNORMAL LOW (ref >60)

## 2022-09-09 LAB — CBG MONITORING, ED
Glucose-Capillary: 137 mg/dL — ABNORMAL HIGH (ref 70–99)
Glucose-Capillary: 123 mg/dL — ABNORMAL HIGH (ref 70–99)
Glucose-Capillary: 80 mg/dL (ref 70–99)

## 2022-09-09 LAB — SEDIMENTATION RATE: Sed Rate: 27 mm/hr — ABNORMAL HIGH (ref 0–20)

## 2022-09-09 LAB — PREALBUMIN: Prealbumin: 17 mg/dL — ABNORMAL LOW (ref 18–38)

## 2022-09-09 LAB — C-REACTIVE PROTEIN: CRP: 6.7 mg/dL — ABNORMAL HIGH (ref <1.0)

## 2022-09-09 MED ORDER — ONDANSETRON HCL 4 MG PO TABS: 4.0000 mg | ORAL_TABLET | Freq: Four times a day (QID) | ORAL | Status: DC | PRN

## 2022-09-09 MED ORDER — METOPROLOL TARTRATE 5 MG/5ML IV SOLN: 5.0000 mg | INTRAVENOUS | Status: DC | PRN

## 2022-09-09 MED ORDER — ZINC SULFATE 220 (50 ZN) MG PO CAPS
220.0000 mg | ORAL_CAPSULE | Freq: Every day | ORAL | Status: DC
  Administered 2022-09-09 – 2022-09-11 (×3): 220 mg via ORAL
  Filled 2022-09-09 (×3): qty 1

## 2022-09-09 MED ORDER — ALBUTEROL SULFATE (2.5 MG/3ML) 0.083% IN NEBU: 2.5000 mg | INHALATION_SOLUTION | RESPIRATORY_TRACT | Status: DC | PRN

## 2022-09-09 MED ORDER — AMLODIPINE BESYLATE 5 MG PO TABS
5.0000 mg | ORAL_TABLET | Freq: Every day | ORAL | Status: DC
  Administered 2022-09-09 – 2022-09-11 (×3): 5 mg via ORAL
  Filled 2022-09-09 (×3): qty 1

## 2022-09-09 MED ORDER — OXYCODONE HCL 5 MG PO TABS
15.0000 mg | ORAL_TABLET | Freq: Four times a day (QID) | ORAL | Status: DC | PRN
  Administered 2022-09-09 – 2022-09-11 (×10): 15 mg via ORAL
  Filled 2022-09-09 (×10): qty 3

## 2022-09-09 MED ORDER — METRONIDAZOLE 500 MG/100ML IV SOLN
500.0000 mg | Freq: Two times a day (BID) | INTRAVENOUS | Status: DC
  Administered 2022-09-09: 500 mg via INTRAVENOUS
  Filled 2022-09-09: qty 100

## 2022-09-09 MED ORDER — BUPROPION HCL ER (XL) 150 MG PO TB24
150.0000 mg | ORAL_TABLET | Freq: Every day | ORAL | Status: DC
  Administered 2022-09-09 – 2022-09-11 (×3): 150 mg via ORAL
  Filled 2022-09-09 (×3): qty 1

## 2022-09-09 MED ORDER — TRAZODONE HCL 50 MG PO TABS
50.0000 mg | ORAL_TABLET | Freq: Every evening | ORAL | Status: DC | PRN
  Administered 2022-09-10 – 2022-09-11 (×2): 50 mg via ORAL
  Filled 2022-09-09 (×2): qty 1

## 2022-09-09 MED ORDER — HEPARIN SODIUM (PORCINE) 5000 UNIT/ML IJ SOLN
5000.0000 [IU] | Freq: Three times a day (TID) | INTRAMUSCULAR | Status: DC
  Administered 2022-09-09 – 2022-09-11 (×7): 5000 [IU] via SUBCUTANEOUS
  Filled 2022-09-09 (×7): qty 1

## 2022-09-09 MED ORDER — SENNOSIDES-DOCUSATE SODIUM 8.6-50 MG PO TABS: 1.0000 | ORAL_TABLET | Freq: Every evening | ORAL | Status: DC | PRN

## 2022-09-09 MED ORDER — VANCOMYCIN HCL 1500 MG/300ML IV SOLN: 1500.0000 mg | INTRAVENOUS | Status: DC

## 2022-09-09 MED ORDER — VANCOMYCIN HCL 1750 MG/350ML IV SOLN: 1750.0000 mg | INTRAVENOUS | Status: DC

## 2022-09-09 MED ORDER — IPRATROPIUM-ALBUTEROL 0.5-2.5 (3) MG/3ML IN SOLN: 3.0000 mL | RESPIRATORY_TRACT | Status: DC | PRN

## 2022-09-09 MED ORDER — LISDEXAMFETAMINE DIMESYLATE 20 MG PO CAPS: 40.0000 mg | ORAL_CAPSULE | ORAL | Status: DC

## 2022-09-09 MED ORDER — MUPIROCIN CALCIUM 2 % EX CREA
TOPICAL_CREAM | Freq: Every day | CUTANEOUS | Status: DC
  Filled 2022-09-09 (×2): qty 15

## 2022-09-09 MED ORDER — HYDRALAZINE HCL 20 MG/ML IJ SOLN: 10.0000 mg | INTRAMUSCULAR | Status: DC | PRN

## 2022-09-09 MED ORDER — VITAMIN C 500 MG PO TABS
500.0000 mg | ORAL_TABLET | Freq: Every day | ORAL | Status: DC
  Administered 2022-09-09 – 2022-09-11 (×3): 500 mg via ORAL
  Filled 2022-09-09 (×3): qty 1

## 2022-09-09 MED ORDER — IOHEXOL 300 MG/ML  SOLN
100.0000 mL | Freq: Once | INTRAMUSCULAR | Status: AC | PRN
  Administered 2022-09-09: 100 mL via INTRAVENOUS

## 2022-09-09 MED ORDER — SODIUM CHLORIDE 0.9 % IV SOLN
2.0000 g | INTRAVENOUS | Status: DC
  Administered 2022-09-09: 2 g via INTRAVENOUS
  Filled 2022-09-09: qty 20

## 2022-09-09 MED ORDER — ZOLPIDEM TARTRATE 5 MG PO TABS
10.0000 mg | ORAL_TABLET | Freq: Every evening | ORAL | Status: DC | PRN
  Administered 2022-09-09: 10 mg via ORAL
  Filled 2022-09-09: qty 2

## 2022-09-09 MED ORDER — MUPIROCIN 2 % EX OINT
TOPICAL_OINTMENT | Freq: Every day | CUTANEOUS | Status: DC
  Administered 2022-09-10: 1 via TOPICAL
  Filled 2022-09-09: qty 22

## 2022-09-09 MED ORDER — ACETAMINOPHEN 325 MG PO TABS
650.0000 mg | ORAL_TABLET | Freq: Four times a day (QID) | ORAL | Status: DC | PRN
  Administered 2022-09-10 – 2022-09-11 (×2): 650 mg via ORAL
  Filled 2022-09-09 (×2): qty 2

## 2022-09-09 MED ORDER — DULOXETINE HCL 30 MG PO CPEP
60.0000 mg | ORAL_CAPSULE | Freq: Every day | ORAL | Status: DC
  Administered 2022-09-09 – 2022-09-11 (×3): 60 mg via ORAL
  Filled 2022-09-09 (×2): qty 1
  Filled 2022-09-09: qty 2

## 2022-09-09 MED ORDER — SODIUM CHLORIDE 0.9 % IV SOLN: INTRAVENOUS | Status: AC

## 2022-09-09 MED ORDER — LEVOTHYROXINE SODIUM 50 MCG PO TABS
75.0000 ug | ORAL_TABLET | Freq: Every day | ORAL | Status: DC
  Administered 2022-09-09 – 2022-09-11 (×3): 75 ug via ORAL
  Filled 2022-09-09: qty 1
  Filled 2022-09-09 (×2): qty 2

## 2022-09-09 MED ORDER — ACETAMINOPHEN 650 MG RE SUPP: 650.0000 mg | Freq: Four times a day (QID) | RECTAL | Status: DC | PRN

## 2022-09-09 MED ORDER — CARVEDILOL 6.25 MG PO TABS
3.1250 mg | ORAL_TABLET | Freq: Two times a day (BID) | ORAL | Status: DC
  Administered 2022-09-09 – 2022-09-11 (×5): 3.125 mg via ORAL
  Filled 2022-09-09 (×5): qty 1

## 2022-09-09 MED ORDER — ATORVASTATIN CALCIUM 20 MG PO TABS
40.0000 mg | ORAL_TABLET | Freq: Every day | ORAL | Status: DC
  Administered 2022-09-09 – 2022-09-11 (×3): 40 mg via ORAL
  Filled 2022-09-09 (×3): qty 2

## 2022-09-09 MED ORDER — INSULIN ASPART 100 UNIT/ML IJ SOLN: 0.0000 [IU] | Freq: Three times a day (TID) | INTRAMUSCULAR | Status: DC

## 2022-09-09 MED ORDER — ALBUTEROL SULFATE (2.5 MG/3ML) 0.083% IN NEBU: 2.5000 mg | INHALATION_SOLUTION | Freq: Four times a day (QID) | RESPIRATORY_TRACT | Status: DC | PRN

## 2022-09-09 MED ORDER — ONDANSETRON HCL 4 MG/2ML IJ SOLN: 4.0000 mg | Freq: Four times a day (QID) | INTRAMUSCULAR | Status: DC | PRN

## 2022-09-09 MED ORDER — CEFAZOLIN SODIUM-DEXTROSE 2-4 GM/100ML-% IV SOLN
2.0000 g | Freq: Three times a day (TID) | INTRAVENOUS | Status: DC
  Administered 2022-09-09 – 2022-09-11 (×5): 2 g via INTRAVENOUS
  Filled 2022-09-09 (×6): qty 100
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
## 2022-09-09 NOTE — Progress Notes (Signed)
Pharmacy Antibiotic Note  Charles [name] is a 62 y.o. male admitted on 09/08/2022 with  diabetic foot infection .  Pharmacy has been consulted for vancomycin dosing.  Plan: Vancomycin 1 gm IV X 1 given on 5/22 @ 0023 followed by vancomycin 1500 mg IV X 1 to make total loading dose of 2500 mg.   Vancomycin 1500 mg IV Q24H ordered to start on 5/23 @ 0000.  AUC = 474.3 Vanc trough = 10.7   Height: 6\' 1"  (185.4 cm) Weight: 120.2 kg (265 lb) IBW/kg (Calculated) : 79.9  Temp (24hrs), Avg:99 F (37.2 C), Min:99 F (37.2 C), Max:99 F (37.2 C)  Recent Labs  Lab 09/08/22 1916 09/09/22 0206  WBC 11.2*  --   CREATININE 1.49*  --   LATICACIDVEN 4.0* 2.9*    Estimated Creatinine Clearance: 69.8 mL/min (A) (by C-G formula based on SCr of 1.49 mg/dL (H)).    Allergies  Allergen Reactions   Lisinopril Cough    Antimicrobials this admission:   >>    >>   Dose adjustments this admission:   Microbiology results:  BCx:   UCx:    Sputum:    MRSA PCR:   Thank you for allowing pharmacy to be a part of this patient's care.  Zimir Kittleson D 09/09/2022 3:35 AM

## 2022-09-09 NOTE — ED Notes (Signed)
Per MRI pt must have remote for spinal cord stimulator in hand and a phone call has to be made to manufacturer of stimulator to clarify "conditions okay for MRI" prior to pt getting MRI. Pt is aware and will attempt to make phone call for family to bring remote in the AM.

## 2022-09-09 NOTE — ED Notes (Signed)
Dr. Graciela Husbands at bedside states it's ok to apply ointment and cover with a bandage. Ointment applied to right foot and covered with bandage with no issues, pt appreciative.

## 2022-09-09 NOTE — Consult Note (Addendum)
WOC Nurse Consult Note: Reason for Consult: Consult requested for right foot wound.  Pt is currently on systemic antibiotics.  CT scan indicates, " Midline superficial ulceration along the plantar aspect of the amputation stump with underlying phlegmon, but no discrete abscess. No CT evidence of osteomyelitis. Achilles tendinosis."  Podiatry consult is pending for further recommendations.  Wound type: Full thickness wound to right posterior foot; .5X.5X.3cm, red and dry; no odor, drainage, or fluctuance.  Dry callous slightly raised edges.  Dressing procedure/placement/frequency: Topical treatment orders provided for bedside nurses to perform as follows to promote moist healing: Apply Bactroban to right foot wound Q day, then cover with foam dressing.  Change foam dressing Q 3 days or PRN soiling Please re-consult if further assistance is needed.  Thank-you,  Cammie Mcgee MSN, RN, CWOCN, [place], CNS [phone]

## 2022-09-09 NOTE — Progress Notes (Signed)
Pharmacy Antibiotic Note  BUFORD BEDSOLE is a 62 y.o. male admitted on 09/08/2022 with  diabetic foot infection .  Pharmacy has been consulted for vancomycin dosing. Patient is also ordered ceftriaxone and metronidazole.  Patient with AKI on admission (Scr 1.49 >> 1.35, baseline ~ 0.9). Dosing in most recent admissions was vancomycin 1.25 g IV q12h with Scr at baseline.  Plan:  Adjust vancomycin to 1.75 g IV q24h --Calculated AUC: 505, Cmin: 10.9 --Daily Scr per protocol --Levels at steady state or as clinically indicated  Height: 6\' 1"  (185.4 cm) Weight: 120.2 kg (265 lb) IBW/kg (Calculated) : 79.9  Temp (24hrs), Avg:98.8 F (37.1 C), Min:98.6 F (37 C), Max:99 F (37.2 C)  Recent Labs  Lab 09/08/22 1916 09/09/22 0206 09/09/22 0329  WBC 11.2*  --  7.4  CREATININE 1.49*  --  1.35*  LATICACIDVEN 4.0* 2.9*  --      Estimated Creatinine Clearance: 77 mL/min (A) (by C-G formula based on SCr of 1.35 mg/dL (H)).    Allergies  Allergen Reactions   Lisinopril Cough    Antimicrobials this admission: Cefepime 5/21 x 1  Metronidazole 5/21 >>  Vancomycin 5/22 >>  Ceftriaxone 5/22 >>   Dose adjustments this admission: N/A  Microbiology results: 5/21 BCx: NGTD  Thank you for allowing pharmacy to be a part of this patient's care.  Tressie Ellis 09/09/2022 9:53 AM

## 2022-09-09 NOTE — Progress Notes (Signed)
PROGRESS NOTE    Charles [name]  ZOX:[id number] DOB: 1960-11-29 DOA: 09/08/2022 PCP: Carlean Jews, PA-C   Brief Narrative:  62 year old with history of CAD status post PCI, DM2, hypothyroidism, HTN, HLD, left foot osteomyelitis status post BKA 04/09/2002, right TMA presents with right foot plantar ulcer with associated swelling and pain over the last 10 days.  X-ray did not show any acute abnormality but there is high concern for possible osteomyelitis.  Started on vancomycin and cefepime.  We were unable to obtain MRI due to spinal stimulator in place therefore CT of the foot obtained.  Dr. Alberteen Spindle from podiatry and Dr. Rivka Safer from ID consulted.   Assessment & Plan:  Principal Problem:   Diabetic foot infection (HCC)   Right foot diabetic foot ulcer Peripheral arterial disease status post left-sided AKA - Highly concerning for osteomyelitis.  Although x-rays negative patient, unable to get MRI therefore obtained CT which doesn't show osteo or abscess but there may be some phegmon. Dr Alberteen Spindle from podiatry consulted for possible I&D. Marland Kitchen  Continue broad-spectrum antibiotics. Elevated ESR. ID consulted for Abx recs.   Acute kidney injury - Baseline creatinine 0.9, admission creatinine 1.49.  Continue IV fluids.  Hypokalemia - As needed repletion.  Monitor other electrolytes  Lactic acidosis - Improved with IV fluids  Diabetes mellitus type 2 - Last A1c 5.1.  Per outpatient provider patient is no longer diabetic  Essential hypertension - Norvasc 5 mg, Coreg 3.125 mg twice daily.  IV as needed  History of CAD - On.  Currently chest pain-free.  Continue home medication.  Hypothyroidism Send  Hyperlipidemia - Statin     DVT prophylaxis: Subcu heparin Code Status: Full code Family Communication:   Status is: Inpatient On going Abx and infection management. Cont hosp stay for atleast 2 days.        Diet Orders (From admission, onward)     Start     Ordered    09/09/22 0323  Diet heart healthy/carb modified Room service appropriate? Yes; Fluid consistency: Thin  Diet effective now       Question Answer Comment  Diet-HS Snack? Nothing   Room service appropriate? Yes   Fluid consistency: Thin      09/09/22 0324            Subjective: Right foot is foul smelling, minimal drainage on my eval.     Examination:  General exam: Appears calm and comfortable  Respiratory system: Clear to auscultation. Respiratory effort normal. Cardiovascular system: S1 & S2 heard, RRR. No JVD, murmurs, rubs, gallops or clicks. No pedal edema. Gastrointestinal system: Abdomen is nondistended, soft and nontender. No organomegaly or masses felt. Normal bowel sounds heard. Central nervous system: Alert and oriented. No focal neurological deficits. Extremities: Symmetric 5 x 5 power. Skin: right foot erythema and mild drainage. Left sided AKA Psychiatry: Judgement and insight appear normal. Mood & affect appropriate.         Objective: Vitals:   09/09/22 0300 09/09/22 0355 09/09/22 0600 09/09/22 0805  BP: 116/75  (!) 143/79 (!) 150/87  Pulse: 93  91 90  Resp: 19  18 14   Temp:  98.6 F (37 C)    TempSrc:      SpO2: 96%  97% 97%  Weight:      Height:        Intake/Output Summary (Last 24 hours) at 09/09/2022 0849 Last data filed at 09/09/2022 0200 Gross per 24 hour  Intake 2191.15 ml  Output --  Net 2191.15 ml   Filed Weights   09/08/22 1911  Weight: 120.2 kg    Scheduled Meds:  amLODipine  5 mg Oral Daily   ascorbic acid  500 mg Oral Daily   atorvastatin  40 mg Oral Daily   buPROPion  150 mg Oral Daily   carvedilol  3.125 mg Oral BID WC   DULoxetine  60 mg Oral Daily   heparin  5,000 Units Subcutaneous Q8H   levothyroxine  75 mcg Oral Q0600   zinc sulfate  220 mg Oral Daily   Continuous Infusions:  sodium chloride 75 mL/hr at 09/09/22 0347   cefTRIAXone (ROCEPHIN)  IV Stopped (09/09/22 1610)   metronidazole     [START ON  09/10/2022] vancomycin      Nutritional status     Body mass index is 34.96 kg/m.  Data Reviewed:   CBC: Recent Labs  Lab 09/08/22 1916 09/09/22 0329  WBC 11.2* 7.4  NEUTROABS 8.4*  --   HGB 12.0* 10.2*  HCT 38.7* 32.7*  MCV 83.6 84.1  PLT 319 221   Basic Metabolic Panel: Recent Labs  Lab 09/08/22 1916 09/09/22 0329  NA 136  --   K 2.7*  --   CL 96*  --   CO2 24  --   GLUCOSE 100*  --   BUN 15  --   CREATININE 1.49* 1.35*  CALCIUM 8.5*  --    GFR: Estimated Creatinine Clearance: 77 mL/min (A) (by C-G formula based on SCr of 1.35 mg/dL (H)). Liver Function Tests: Recent Labs  Lab 09/08/22 1916  AST 49*  ALT 38  ALKPHOS 97  BILITOT 1.0  PROT 7.9  ALBUMIN 3.8   No results for input(s): "LIPASE", "AMYLASE" in the last 168 hours. No results for input(s): "AMMONIA" in the last 168 hours. Coagulation Profile: No results for input(s): "INR", "PROTIME" in the last 168 hours. Cardiac Enzymes: No results for input(s): "CKTOTAL", "CKMB", "CKMBINDEX", "TROPONINI" in the last 168 hours. BNP (last 3 results) No results for input(s): "PROBNP" in the last 8760 hours. HbA1C: No results for input(s): "HGBA1C" in the last 72 hours. CBG: No results for input(s): "GLUCAP" in the last 168 hours. Lipid Profile: No results for input(s): "CHOL", "HDL", "LDLCALC", "TRIG", "CHOLHDL", "LDLDIRECT" in the last 72 hours. Thyroid Function Tests: No results for input(s): "TSH", "T4TOTAL", "FREET4", "T3FREE", "THYROIDAB" in the last 72 hours. Anemia Panel: No results for input(s): "VITAMINB12", "FOLATE", "FERRITIN", "TIBC", "IRON", "RETICCTPCT" in the last 72 hours. Sepsis Labs: Recent Labs  Lab 09/08/22 1916 09/09/22 0206  LATICACIDVEN 4.0* 2.9*    Recent Results (from the past 240 hour(s))  Blood culture (routine x 2)     Status: None (Preliminary result)   Collection Time: 09/08/22  7:16 PM   Specimen: BLOOD  Result Value Ref Range Status   Specimen Description BLOOD  RIGHT ANTECUBITAL  Final   Special Requests   Final    BOTTLES DRAWN AEROBIC AND ANAEROBIC Blood Culture results may not be optimal due to an excessive volume of blood received in culture bottles   Culture   Final    NO GROWTH < 12 HOURS Performed at [facility], [address] [place], [place] [address]    Report Status PENDING  Incomplete  Blood culture (routine x 2)     Status: None (Preliminary result)   Collection Time: 09/08/22  7:16 PM   Specimen: BLOOD  Result Value Ref Range Status   Specimen Description BLOOD LEFT ANTECUBITAL  Final   Special Requests   Final    BOTTLES DRAWN AEROBIC AND ANAEROBIC Blood Culture results may not be optimal due to an excessive volume of blood received in culture bottles   Culture   Final    NO GROWTH < 12 HOURS Performed at [facility], [address] [place], [place] [address]    Report Status PENDING  Incomplete         Radiology Studies: DG Foot Complete Right  Result Date: 09/08/2022 CLINICAL DATA:  Pain EXAM: RIGHT FOOT COMPLETE - 3+ VIEW COMPARISON:  Right foot x-ray 12/03/2018 FINDINGS: There has been prior amputation of the mid and distal first through fifth metatarsals. There is no acute fracture or dislocation. No cortical erosions are identified. There is overlying soft tissue swelling at the surgical site. Calcaneal screw is present. Posterior and plantar calcaneal spurs are present. There are mild degenerative osteophytes of the dorsal midfoot. IMPRESSION: 1. Prior amputation of the mid and distal first through fifth metatarsals with overlying soft tissue swelling. 2. No acute bony abnormality. 3. There is high clinical concern for osteomyelitis, further evaluation with MRI would be useful. Electronically Signed   By: Darliss Cheney M.D.   On: 09/08/2022 19:43           LOS: 0 days   Time spent= 35 mins    Khyleigh Furney Joline Maxcy, MD [facility]  If 7PM-7AM, please contact  night-coverage  09/09/2022, 8:49 AM
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
## 2022-09-10 DIAGNOSIS — L03115 Cellulitis of right lower limb: Secondary | ICD-10-CM

## 2022-09-10 DIAGNOSIS — L089 Local infection of the skin and subcutaneous tissue, unspecified: Secondary | ICD-10-CM | POA: Diagnosis not present

## 2022-09-10 DIAGNOSIS — E11628 Type 2 diabetes mellitus with other skin complications: Secondary | ICD-10-CM | POA: Diagnosis not present

## 2022-09-10 LAB — MAGNESIUM: Magnesium: 1.1 mg/dL — ABNORMAL LOW (ref 1.7–2.4)

## 2022-09-10 LAB — BASIC METABOLIC PANEL
Anion gap: 11 (ref 5–15)
BUN: 16 mg/dL (ref 8–23)
CO2: 25 mmol/L (ref 22–32)
Calcium: 7.2 mg/dL — ABNORMAL LOW (ref 8.9–10.3)
Chloride: 104 mmol/L (ref 98–111)
Creatinine, Ser: 1.02 mg/dL (ref 0.61–1.24)
GFR, Estimated: >60 mL/min (ref >60)
Glucose, Bld: 99 mg/dL (ref 70–99)
Potassium: 3.2 mmol/L — ABNORMAL LOW (ref 3.5–5.1)
Sodium: 140 mmol/L (ref 135–145)

## 2022-09-10 LAB — CBG MONITORING, ED
Glucose-Capillary: 115 mg/dL — ABNORMAL HIGH (ref 70–99)
Glucose-Capillary: 115 mg/dL — ABNORMAL HIGH (ref 70–99)

## 2022-09-10 LAB — GLUCOSE, CAPILLARY
Glucose-Capillary: 109 mg/dL — ABNORMAL HIGH (ref 70–99)
Glucose-Capillary: 86 mg/dL (ref 70–99)

## 2022-09-10 LAB — CBC
HCT: 30.9 % — ABNORMAL LOW (ref 39.0–52.0)
Hemoglobin: 9.3 g/dL — ABNORMAL LOW (ref 13.0–17.0)
MCH: 25.8 pg — ABNORMAL LOW (ref 26.0–34.0)
MCHC: 30.1 g/dL (ref 30.0–36.0)
MCV: 85.8 fL (ref 80.0–100.0)
Platelets: 239 10*3/uL (ref 150–400)
RBC: 3.6 MIL/uL — ABNORMAL LOW (ref 4.22–5.81)
RDW: 20.8 % — ABNORMAL HIGH (ref 11.5–15.5)
WBC: 4.5 10*3/uL (ref 4.0–10.5)
nRBC: 0 % (ref 0.0–0.2)

## 2022-09-10 LAB — PHOSPHORUS: Phosphorus: 3.8 mg/dL (ref 2.5–4.6)

## 2022-09-10 MED ORDER — MAGNESIUM SULFATE 2 GM/50ML IV SOLN
2.0000 g | INTRAVENOUS | Status: AC
  Administered 2022-09-10 (×3): 2 g via INTRAVENOUS
  Filled 2022-09-10 (×3): qty 50

## 2022-09-10 MED ORDER — POTASSIUM CHLORIDE CRYS ER 20 MEQ PO TBCR
40.0000 meq | EXTENDED_RELEASE_TABLET | Freq: Once | ORAL | Status: AC
  Administered 2022-09-10: 40 meq via ORAL
  Filled 2022-09-10: qty 2

## 2022-09-10 MED ORDER — POTASSIUM CHLORIDE 10 MEQ/100ML IV SOLN
10.0000 meq | INTRAVENOUS | Status: AC
  Administered 2022-09-10 (×4): 10 meq via INTRAVENOUS
  Filled 2022-09-10: qty 100

## 2022-09-10 NOTE — Evaluation (Signed)
Physical Therapy Evaluation Patient Details Name: Charles [name] MRN: [id number] DOB: 19-Aug-1960 Today's Date: 09/10/2022  History of Present Illness  Charles [name] is a 62 y.o. male with medical history significant of CAD s/p PCI to LAD, type 2 diabetes, hypothyroidism, hypertension, hyperlipidemia, left foot osteomyelitis s/p BKA (04/09/2022),  Right TMA, who presents with right foot plantar ulcer with associated swelling and pain over the last 10 days that has been progressive. He notes no odor or drainage but has noted increase pain and swelling. He denies any associated fever/chills/ n/v/d/ sob/chest pain. He notes he is concerned as prior to have his left AKA  he had a similar ulcer.   Clinical Impression  Patient received sitting up on side of bed eating lunch. He reports he has been doing transfers only since last ambulation in December 2023. He reports he will get prosthetic soon. Patient is able to safely demonstrate transfer from Bed><chair with supervision. Patient states he has a wheelchair that he has borrowed that is really a transport chair. He is unable to mobilize independently with it. Patient is at baseline functional level. No further PT needs at this time.          Recommendations for follow up therapy are one component of a multi-disciplinary discharge planning process, led by the attending physician.  Recommendations may be updated based on patient status, additional functional criteria and insurance authorization.  Follow Up Recommendations       Assistance Recommended at Discharge Set up Supervision/Assistance  Patient can return home with the following  Help with stairs or ramp for entrance    Equipment Recommendations Wheelchair (measurements PT);Wheelchair cushion (measurements PT)  Recommendations for Other Services       Functional Status Assessment Patient has not had a recent decline in their functional status     Precautions / Restrictions  Precautions Precautions: Fall Restrictions Weight Bearing Restrictions: No      Mobility  Bed Mobility Overal bed mobility: Independent                  Transfers Overall transfer level: Independent                      Ambulation/Gait               General Gait Details: patient is not ambulatory since amputation. Getting prosthetic soon  Stairs            Wheelchair Mobility    Modified Rankin (Stroke Patients Only)       Balance Overall balance assessment: Modified Independent                                           Pertinent Vitals/Pain Pain Assessment Pain Assessment: No/denies pain    Home Living Family/patient expects to be discharged to:: Private residence Living Arrangements: Spouse/significant other Available Help at Discharge: Family;Available 24 hours/day Type of Home: House Home Access: Stairs to enter   [employer] of Steps: 2   Home Layout: Two level;Able to live on main level with bedroom/bathroom        Prior Function               Mobility Comments: Patient states he has been using wheelchair since prior amputation ADLs Comments: Ind with ADLs     Hand Dominance   Dominant Hand: Right  Extremity/Trunk Assessment   Upper Extremity Assessment Upper Extremity Assessment: Overall WFL for tasks assessed            Communication   Communication: No difficulties  Cognition Arousal/Alertness: Awake/alert Behavior During Therapy: WFL for tasks assessed/performed Overall Cognitive Status: Within Functional Limits for tasks assessed                                          General Comments      Exercises     Assessment/Plan    PT Assessment Patient does not need any further PT services  PT Problem List         PT Treatment Interventions      PT Goals (Current goals can be found in the Care Plan section)  Acute Rehab PT Goals Patient  Stated Goal: to return home, get wheelchair PT Goal Formulation: With patient Time For Goal Achievement: 09/12/22 Potential to Achieve Goals: Good    Frequency       Co-evaluation               AM-PAC PT "6 Clicks" Mobility  Outcome Measure Help needed turning from your back to your side while in a flat bed without using bedrails?: None Help needed moving from lying on your back to sitting on the side of a flat bed without using bedrails?: None Help needed moving to and from a bed to a chair (including a wheelchair)?: None Help needed standing up from a chair using your arms (e.g., wheelchair or bedside chair)?: A Lot Help needed to walk in hospital room?: A Lot Help needed climbing 3-5 steps with a railing? : A Lot 6 Click Score: 18    End of Session   Activity Tolerance: Patient tolerated treatment well Patient left: in bed Nurse Communication: Mobility status      Time: 1141-1149 PT Time Calculation (min) (ACUTE ONLY): 8 min   Charges:   PT Evaluation $PT Eval Low Complexity: 1 Low          Graylin Sperling, PT, GCS 09/10/22,12:51 PM

## 2022-09-10 NOTE — ED Notes (Signed)
Pt eating, NAD, calm. PT by to see.

## 2022-09-10 NOTE — Progress Notes (Signed)
OT Cancellation Note  Patient Details Name: Charles [name] MRN: [id number] DOB: October 26, 1960   Cancelled Treatment:    Reason Eval/Treat Not Completed: OT screened, no needs identified, will sign off. OT order received, chart reviewed. Spoke with physical therapist who indicated pt is back to functional baseline for ADL management. Performing functional transfers at baseline level. No skilled needs identified at this time. Will sign off in house, please, please re-consult if additional OT needs arise during this hospital stay.   Rockney Ghee, M.S., OTR/L 09/10/22, 12:54 PM

## 2022-09-10 NOTE — Progress Notes (Signed)
PROGRESS NOTE    Charles [name]  NWG:[id number] DOB: 1961/01/23 DOA: 09/08/2022 PCP: Carlean Jews, PA-C   Brief Narrative:  62 year old with history of CAD status post PCI, DM2, hypothyroidism, HTN, HLD, left foot osteomyelitis status post BKA 04/09/2002, right TMA presents with right foot plantar ulcer with associated swelling and pain over the last 10 days.  X-ray did not show any acute abnormality but there is high concern for possible osteomyelitis.  Started on vancomycin and cefepime.  We were unable to obtain MRI due to spinal stimulator in place therefore CT of the foot obtained.  Dr. Alberteen Spindle from podiatry and Dr. Rivka Safer from ID consulted.  Superficial debridement performed by podiatry.  ID recommending IV Ancef and eventually changed to cefadroxil   Assessment & Plan:  Principal Problem:   Diabetic foot infection (HCC)   Right foot diabetic foot ulcer Peripheral arterial disease status post left-sided AKA - Highly concerning for osteomyelitis.  Although x-rays negative patient, unable to get MRI therefore obtained CT which doesn't show osteo or abscess but there may be some phegmon.  Seen by podiatry, superficial debridement performed.  Also seen by ID, changed to IV cefazolin with eventual plans to change to cefadroxil for 5 days.  Acute kidney injury, resolved - Baseline creatinine 0.9, admission creatinine 1.49.  Creatinine back down to 1.0  Hypokalemia/hypomagnesemia - Aggressive repletion.  Check phosphorus  Lactic acidosis - Improved with IV fluids  Diabetes mellitus type 2 - Last A1c 5.1.  Per outpatient provider patient is no longer diabetic since gastric sleeve  Essential hypertension - Norvasc 5 mg, Coreg 3.125 mg twice daily.  IV as needed  History of CAD - On.  Currently chest pain-free.  Continue home medication.  Hypothyroidism Send  Hyperlipidemia - Statin     DVT prophylaxis: Subcu heparin Code Status: Full code Family Communication:    Status is: Inpatient Aggressive electrolyte repletion today.  Should be able to discharge him if stable tomorrow     Diet Orders (From admission, onward)     Start     Ordered   09/09/22 0323  Diet heart healthy/carb modified Room service appropriate? Yes; Fluid consistency: Thin  Diet effective now       Question Answer Comment  Diet-HS Snack? Nothing   Room service appropriate? Yes   Fluid consistency: Thin      09/09/22 0324            Subjective: Feels ok no new complaints.  Examination: Constitutional: Not in acute distress Respiratory: Clear to auscultation bilaterally Cardiovascular: Normal sinus rhythm, no rubs Abdomen: Nontender nondistended good bowel sounds Musculoskeletal: left bka Skin: right foot erythema improved. No fou smell today Neurologic: CN 2-12 grossly intact.  And nonfocal Psychiatric: Normal judgment and insight. Alert and oriented x 3. Normal mood.            Objective: Vitals:   09/10/22 0545 09/10/22 0615 09/10/22 0645 09/10/22 0715  BP:      Pulse:      Resp: 13 (!) 21 13 11   Temp:      TempSrc:      SpO2:      Weight:      Height:        Intake/Output Summary (Last 24 hours) at 09/10/2022 0914 Last data filed at 09/10/2022 0635 Gross per 24 hour  Intake 236 ml  Output 350 ml  Net -114 ml   Filed Weights   09/08/22 1911  Weight: 120.2 kg  Scheduled Meds:  amLODipine  5 mg Oral Daily   ascorbic acid  500 mg Oral Daily   atorvastatin  40 mg Oral Daily   buPROPion  150 mg Oral Daily   carvedilol  3.125 mg Oral BID WC   DULoxetine  60 mg Oral Daily   heparin  5,000 Units Subcutaneous Q8H   insulin aspart  0-6 Units Subcutaneous TID WC   levothyroxine  75 mcg Oral Q0600   mupirocin ointment   Topical Daily   potassium chloride  40 mEq Oral Once   zinc sulfate  220 mg Oral Daily   Continuous Infusions:   ceFAZolin (ANCEF) IV Stopped (09/10/22 0516)   magnesium sulfate bolus IVPB     potassium chloride       Nutritional status     Body mass index is 34.96 kg/m.  Data Reviewed:   CBC: Recent Labs  Lab 09/08/22 1916 09/09/22 0329 09/10/22 0650  WBC 11.2* 7.4 4.5  NEUTROABS 8.4*  --   --   HGB 12.0* 10.2* 9.3*  HCT 38.7* 32.7* 30.9*  MCV 83.6 84.1 85.8  PLT 319 221 239   Basic Metabolic Panel: Recent Labs  Lab 09/08/22 1916 09/09/22 0329 09/10/22 0650  NA 136  --  140  K 2.7*  --  3.2*  CL 96*  --  104  CO2 24  --  25  GLUCOSE 100*  --  99  BUN 15  --  16  CREATININE 1.49* 1.35* 1.02  CALCIUM 8.5*  --  7.2*  MG  --   --  1.1*   GFR: Estimated Creatinine Clearance: 102 mL/min (by C-G formula based on SCr of 1.02 mg/dL). Liver Function Tests: Recent Labs  Lab 09/08/22 1916  AST 49*  ALT 38  ALKPHOS 97  BILITOT 1.0  PROT 7.9  ALBUMIN 3.8   No results for input(s): "LIPASE", "AMYLASE" in the last 168 hours. No results for input(s): "AMMONIA" in the last 168 hours. Coagulation Profile: No results for input(s): "INR", "PROTIME" in the last 168 hours. Cardiac Enzymes: No results for input(s): "CKTOTAL", "CKMB", "CKMBINDEX", "TROPONINI" in the last 168 hours. BNP (last 3 results) No results for input(s): "PROBNP" in the last 8760 hours. HbA1C: No results for input(s): "HGBA1C" in the last 72 hours. CBG: Recent Labs  Lab 09/09/22 1224 09/09/22 1704 09/09/22 2107  GLUCAP 137* 123* 80   Lipid Profile: No results for input(s): "CHOL", "HDL", "LDLCALC", "TRIG", "CHOLHDL", "LDLDIRECT" in the last 72 hours. Thyroid Function Tests: No results for input(s): "TSH", "T4TOTAL", "FREET4", "T3FREE", "THYROIDAB" in the last 72 hours. Anemia Panel: No results for input(s): "VITAMINB12", "FOLATE", "FERRITIN", "TIBC", "IRON", "RETICCTPCT" in the last 72 hours. Sepsis Labs: Recent Labs  Lab 09/08/22 1916 09/09/22 0206  LATICACIDVEN 4.0* 2.9*    Recent Results (from the past 240 hour(s))  Blood culture (routine x 2)     Status: None (Preliminary result)    Collection Time: 09/08/22  7:16 PM   Specimen: BLOOD  Result Value Ref Range Status   Specimen Description BLOOD RIGHT ANTECUBITAL  Final   Special Requests   Final    BOTTLES DRAWN AEROBIC AND ANAEROBIC Blood Culture results may not be optimal due to an excessive volume of blood received in culture bottles   Culture   Final    NO GROWTH 2 DAYS Performed at [facility], [address], [place], [place] [address]    Report Status PENDING  Incomplete  Blood culture (routine x 2)  Status: None (Preliminary result)   Collection Time: 09/08/22  7:16 PM   Specimen: BLOOD  Result Value Ref Range Status   Specimen Description BLOOD LEFT ANTECUBITAL  Final   Special Requests   Final    BOTTLES DRAWN AEROBIC AND ANAEROBIC Blood Culture results may not be optimal due to an excessive volume of blood received in culture bottles   Culture   Final    NO GROWTH 2 DAYS Performed at [facility], [address] [place], [place] [address]    Report Status PENDING  Incomplete         Radiology Studies: CT FOOT RIGHT W CONTRAST  Result Date: 09/09/2022 CLINICAL DATA:  Progressively worsening right foot pain over the past 10 days, with redness and swelling. Plantar foot ulcer. History of transmetatarsal amputation. EXAM: CT OF THE LOWER RIGHT EXTREMITY WITH CONTRAST TECHNIQUE: Multidetector CT imaging of the lower right extremity was performed according to the standard protocol following intravenous contrast administration. RADIATION DOSE REDUCTION: This exam was performed according to the departmental dose-optimization program which includes automated exposure control, adjustment of the mA and/or kV according to patient size and/or use of iterative reconstruction technique. CONTRAST:  OMNIPAQUE IOHEXOL 300 MG/ML  SOLN COMPARISON:  Right foot x-rays from yesterday. CT right foot dated December 02, 2018. FINDINGS: Bones/Joint/Cartilage Postsurgical changes from prior  transmetatarsal amputation. Amputation margins are sharp. No bony destruction or periosteal reaction. No fracture or dislocation. Mild midfoot and hindfoot osteoarthritis. Healed calcaneal osteotomy status post fixation with a single cannulated screw. No joint effusion. Ligaments Ligaments are suboptimally evaluated by CT. Muscles and Tendons Postsurgical changes of the flexor and extensor tendons. Otherwise grossly. No tenosynovitis. Mild-to-moderate thickening of the proximal and mid Achilles tendon. Soft tissue Midline superficial ulceration along the plantar aspect of the amputation stump with underlying ill-defined hypodensity, but no discrete rim enhancing fluid collection. Diffuse soft tissue swelling of the foot. No soft tissue mass. IMPRESSION: 1. Midline superficial ulceration along the plantar aspect of the amputation stump with underlying phlegmon, but no discrete abscess. 2. No CT evidence of osteomyelitis. 3. Achilles tendinosis. Electronically Signed   By: Obie Dredge M.D.   On: 09/09/2022 10:02   DG Foot Complete Right  Result Date: 09/08/2022 CLINICAL DATA:  Pain EXAM: RIGHT FOOT COMPLETE - 3+ VIEW COMPARISON:  Right foot x-ray 12/03/2018 FINDINGS: There has been prior amputation of the mid and distal first through fifth metatarsals. There is no acute fracture or dislocation. No cortical erosions are identified. There is overlying soft tissue swelling at the surgical site. Calcaneal screw is present. Posterior and plantar calcaneal spurs are present. There are mild degenerative osteophytes of the dorsal midfoot. IMPRESSION: 1. Prior amputation of the mid and distal first through fifth metatarsals with overlying soft tissue swelling. 2. No acute bony abnormality. 3. There is high clinical concern for osteomyelitis, further evaluation with MRI would be useful. Electronically Signed   By: Darliss Cheney M.D.   On: 09/08/2022 19:43           LOS: 1 day   Time spent= 35 mins    Denya Buckingham  Joline Maxcy, MD [facility]  If 7PM-7AM, please contact night-coverage  09/10/2022, 9:14 AM

## 2022-09-10 NOTE — ED Notes (Signed)
Admitting MD at [facility], pt alert, NAD, calm, interactive, breakfast finished.

## 2022-09-10 NOTE — Progress Notes (Signed)
Date of Admission:  09/08/2022    ID: Charles [name] is a 62 y.o. male  Principal Problem:   Diabetic foot infection (HCC)    Subjective: Patient complains of pain in the right foot.  He is bearing full weight on that foot. Medications:   amLODipine  5 mg Oral Daily   ascorbic acid  500 mg Oral Daily   atorvastatin  40 mg Oral Daily   buPROPion  150 mg Oral Daily   carvedilol  3.125 mg Oral BID WC   DULoxetine  60 mg Oral Daily   heparin  5,000 Units Subcutaneous Q8H   insulin aspart  0-6 Units Subcutaneous TID WC   levothyroxine  75 mcg Oral Q0600   mupirocin ointment   Topical Daily   zinc sulfate  220 mg Oral Daily    Objective: Vital signs in last 24 hours: Patient Vitals for the past 24 hrs:  BP Temp Temp src Pulse Resp SpO2  09/10/22 1256 -- -- -- 78 12 96 %  09/10/22 1245 111/66 -- -- -- 10 --  09/10/22 1230 114/66 -- -- -- 11 --  09/10/22 1215 124/66 -- -- -- 12 --  09/10/22 1145 (!) 162/86 -- -- -- 19 --  09/10/22 1115 120/73 -- -- -- 13 --  09/10/22 1100 115/70 -- -- -- 10 --  09/10/22 1045 111/67 -- -- -- (!) 9 --  09/10/22 1030 119/65 -- -- -- 10 --  09/10/22 1015 124/76 -- -- -- 12 --  09/10/22 1000 130/75 -- -- 72 13 94 %  09/10/22 0948 123/73 98.6 F (37 C) Oral 73 12 94 %  09/10/22 0945 -- -- -- -- 18 --  09/10/22 0915 -- -- -- -- 12 --  09/10/22 0900 -- -- -- -- 12 --  09/10/22 0845 -- -- -- -- 12 --  09/10/22 0830 -- -- -- -- 12 --  09/10/22 0815 -- -- -- -- 13 --  09/10/22 0800 -- -- -- -- 17 --  09/10/22 0715 -- -- -- -- 11 --  09/10/22 0645 -- -- -- -- 13 --  09/10/22 0615 -- -- -- -- (!) 21 --  09/10/22 0545 -- -- -- -- 13 --  09/10/22 0515 -- -- -- -- 14 --  09/10/22 0445 -- -- -- -- 13 --  09/10/22 0415 -- -- -- -- 13 --  09/10/22 0345 124/76 98.6 F (37 C) Oral 78 18 98 %  09/09/22 2345 (!) 145/80 98 F (36.7 C) Oral (!) 102 20 94 %  09/09/22 2129 -- -- -- -- 15 --  09/09/22 2000 112/73 97.8 F (36.6 C) Oral 85 15 94 %   09/09/22 1725 134/75 98.6 F (37 C) -- 93 14 94 %  09/09/22 1440 132/72 98.6 F (37 C) -- 88 19 93 %       PHYSICAL EXAM:  General: Alert, cooperative, no distress, appears stated age.  Head: Normocephalic, without obvious abnormality, atraumatic. Eyes: Conjunctivae clear, anicteric sclerae. Pupils are equal ENT Nares normal. No drainage or sinus tenderness. Lips, mucosa, and tongue normal. No Thrush Neck: Supple, symmetrical, no adenopathy, thyroid: non tender no carotid bruit and no JVD. Back: No CVA tenderness. Lungs: Clear to auscultation bilaterally. No Wheezing or Rhonchi. No rales. Heart: Regular rate and rhythm, no murmur, rub or gallop. Abdomen: Soft, non-tender,not distended. Bowel sounds normal. No masses Extremities: Left AKA Right TMA Foot is less swollen No erythema Small superficial ulcer on the plantar surface  is very clean with no discharge   5/23    5/22   Skin: No rashes or lesions. Or bruising Lymph: Cervical, supraclavicular normal. Neurologic: Grossly non-focal  Lab Results    Latest Ref Rng & Units 09/10/2022    6:50 AM 09/09/2022    3:29 AM 09/08/2022    7:16 PM  CBC  WBC 4.0 - 10.5 K/uL 4.5  7.4  11.2   Hemoglobin 13.0 - 17.0 g/dL 9.3  16.1  09.6   Hematocrit 39.0 - 52.0 % 30.9  32.7  38.7   Platelets 150 - 400 K/uL 239  221  319        Latest Ref Rng & Units 09/10/2022    6:50 AM 09/09/2022    3:29 AM 09/08/2022    7:16 PM  CMP  Glucose 70 - 99 mg/dL 99   045   BUN 8 - 23 mg/dL 16   15   Creatinine 4.09 - 1.24 mg/dL 8.11  9.14  7.82   Sodium 135 - 145 mmol/L 140   136   Potassium 3.5 - 5.1 mmol/L 3.2   2.7   Chloride 98 - 111 mmol/L 104   96   CO2 22 - 32 mmol/L 25   24   Calcium 8.9 - 10.3 mg/dL 7.2   8.5   Total Protein 6.5 - 8.1 g/dL   7.9   Total Bilirubin 0.3 - 1.2 mg/dL   1.0   Alkaline Phos 38 - 126 U/L   97   AST 15 - 41 U/L   49   ALT 0 - 44 U/L   38       Microbiology: Blood culture no  growth Studies/Results: CT FOOT RIGHT W CONTRAST  Result Date: 09/09/2022 CLINICAL DATA:  Progressively worsening right foot pain over the past 10 days, with redness and swelling. Plantar foot ulcer. History of transmetatarsal amputation. EXAM: CT OF THE LOWER RIGHT EXTREMITY WITH CONTRAST TECHNIQUE: Multidetector CT imaging of the lower right extremity was performed according to the standard protocol following intravenous contrast administration. RADIATION DOSE REDUCTION: This exam was performed according to the departmental dose-optimization program which includes automated exposure control, adjustment of the mA and/or kV according to patient size and/or use of iterative reconstruction technique. CONTRAST:  OMNIPAQUE IOHEXOL 300 MG/ML  SOLN COMPARISON:  Right foot x-rays from yesterday. CT right foot dated December 02, 2018. FINDINGS: Bones/Joint/Cartilage Postsurgical changes from prior transmetatarsal amputation. Amputation margins are sharp. No bony destruction or periosteal reaction. No fracture or dislocation. Mild midfoot and hindfoot osteoarthritis. Healed calcaneal osteotomy status post fixation with a single cannulated screw. No joint effusion. Ligaments Ligaments are suboptimally evaluated by CT. Muscles and Tendons Postsurgical changes of the flexor and extensor tendons. Otherwise grossly. No tenosynovitis. Mild-to-moderate thickening of the proximal and mid Achilles tendon. Soft tissue Midline superficial ulceration along the plantar aspect of the amputation stump with underlying ill-defined hypodensity, but no discrete rim enhancing fluid collection. Diffuse soft tissue swelling of the foot. No soft tissue mass. IMPRESSION: 1. Midline superficial ulceration along the plantar aspect of the amputation stump with underlying phlegmon, but no discrete abscess. 2. No CT evidence of osteomyelitis. 3. Achilles tendinosis. Electronically Signed   By: Obie Dredge M.D.   On: 09/09/2022 10:02   DG  Foot Complete Right  Result Date: 09/08/2022 CLINICAL DATA:  Pain EXAM: RIGHT FOOT COMPLETE - 3+ VIEW COMPARISON:  Right foot x-ray 12/03/2018 FINDINGS: There has been prior amputation of the mid and  distal first through fifth metatarsals. There is no acute fracture or dislocation. No cortical erosions are identified. There is overlying soft tissue swelling at the surgical site. Calcaneal screw is present. Posterior and plantar calcaneal spurs are present. There are mild degenerative osteophytes of the dorsal midfoot. IMPRESSION: 1. Prior amputation of the mid and distal first through fifth metatarsals with overlying soft tissue swelling. 2. No acute bony abnormality. 3. There is high clinical concern for osteomyelitis, further evaluation with MRI would be useful. Electronically Signed   By: Darliss Cheney M.D.   On: 09/08/2022 19:43     Assessment/Plan: Right TMA Cellulitis of the foot Much improved on cefazolin Small shallow ulcer on the plantar surface Not infected After 24 hours may be able to switch to p.o. cefadroxil for 5 more days. He needs special shoe  Left AKA stump healthy  CAD status post stent  Hypertension on amlodipine and carvedilol  Anxiety/depression on duloxetine bupropion and trazodone  Hypothyroidism on Synthroid  Diabetes mellitus pretty much has resolved since his gastric sleeve surgery Hemoglobin A1c is 5.1.  He is not on any medication.  Discussed the management with the patient in great detail.

## 2022-09-11 ENCOUNTER — Other Ambulatory Visit: Payer: Self-pay | Admitting: Infectious Diseases

## 2022-09-11 ENCOUNTER — Other Ambulatory Visit: Payer: Self-pay | Admitting: Physician Assistant

## 2022-09-11 DIAGNOSIS — G4719 Other hypersomnia: Secondary | ICD-10-CM

## 2022-09-11 DIAGNOSIS — L089 Local infection of the skin and subcutaneous tissue, unspecified: Secondary | ICD-10-CM | POA: Diagnosis not present

## 2022-09-11 DIAGNOSIS — L03115 Cellulitis of right lower limb: Secondary | ICD-10-CM | POA: Diagnosis not present

## 2022-09-11 DIAGNOSIS — E11628 Type 2 diabetes mellitus with other skin complications: Secondary | ICD-10-CM | POA: Diagnosis not present

## 2022-09-11 DIAGNOSIS — D72819 Decreased white blood cell count, unspecified: Secondary | ICD-10-CM

## 2022-09-11 LAB — BASIC METABOLIC PANEL
Anion gap: 6 (ref 5–15)
BUN: 9 mg/dL (ref 8–23)
CO2: 27 mmol/L (ref 22–32)
Calcium: 7.4 mg/dL — ABNORMAL LOW (ref 8.9–10.3)
Chloride: 106 mmol/L (ref 98–111)
Creatinine, Ser: 0.87 mg/dL (ref 0.61–1.24)
GFR, Estimated: >60 mL/min (ref >60)
Glucose, Bld: 91 mg/dL (ref 70–99)
Potassium: 3.3 mmol/L — ABNORMAL LOW (ref 3.5–5.1)
Sodium: 139 mmol/L (ref 135–145)

## 2022-09-11 LAB — CBC
HCT: 32.3 % — ABNORMAL LOW (ref 39.0–52.0)
Hemoglobin: 9.8 g/dL — ABNORMAL LOW (ref 13.0–17.0)
MCH: 26.4 pg (ref 26.0–34.0)
MCHC: 30.3 g/dL (ref 30.0–36.0)
MCV: 87.1 fL (ref 80.0–100.0)
Platelets: 250 10*3/uL (ref 150–400)
RBC: 3.71 MIL/uL — ABNORMAL LOW (ref 4.22–5.81)
RDW: 20.5 % — ABNORMAL HIGH (ref 11.5–15.5)
WBC: 3.3 10*3/uL — ABNORMAL LOW (ref 4.0–10.5)
nRBC: 0 % (ref 0.0–0.2)

## 2022-09-11 LAB — GLUCOSE, CAPILLARY
Glucose-Capillary: 82 mg/dL (ref 70–99)
Glucose-Capillary: 107 mg/dL — ABNORMAL HIGH (ref 70–99)

## 2022-09-11 LAB — MAGNESIUM: Magnesium: 2.1 mg/dL (ref 1.7–2.4)

## 2022-09-11 MED ORDER — CEFADROXIL 500 MG PO CAPS: 1000.0000 mg | ORAL_CAPSULE | Freq: Two times a day (BID) | ORAL | 0 refills | Status: AC

## 2022-09-11 NOTE — Plan of Care (Signed)
  Problem: Education: Goal: Knowledge of General Education information will improve Description: Including pain rating scale, medication(s)/side effects and non-pharmacologic comfort measures Outcome: Progressing   Problem: Health Behavior/Discharge Planning: Goal: Ability to manage health-related needs will improve Outcome: Progressing   Problem: Clinical Measurements: Goal: Ability to maintain clinical measurements within normal limits will improve Outcome: Progressing Goal: Will remain free from infection Outcome: Progressing Goal: Diagnostic test results will improve Outcome: Progressing Goal: Respiratory complications will improve Outcome: Progressing Goal: Cardiovascular complication will be avoided Outcome: Progressing   Problem: Activity: Goal: Risk for activity intolerance will decrease Outcome: Progressing   Problem: Nutrition: Goal: Adequate nutrition will be maintained Outcome: Progressing   Problem: Coping: Goal: Level of anxiety will decrease Outcome: Progressing   Problem: Elimination: Goal: Will not experience complications related to bowel motility Outcome: Progressing Goal: Will not experience complications related to urinary retention Outcome: Progressing   Problem: Pain Managment: Goal: General experience of comfort will improve Outcome: Progressing   Problem: Safety: Goal: Ability to remain free from injury will improve Outcome: Progressing   Problem: Skin Integrity: Goal: Risk for impaired skin integrity will decrease Outcome: Progressing   Problem: Education: Goal: Ability to describe self-care measures that may prevent or decrease complications (Diabetes Survival Skills Education) will improve Outcome: Progressing Goal: Individualized Educational Video(s) Outcome: Progressing   Problem: Coping: Goal: Ability to adjust to condition or change in health will improve Outcome: Progressing   Problem: Fluid Volume: Goal: Ability to  maintain a balanced intake and output will improve Outcome: Progressing   Problem: Health Behavior/Discharge Planning: Goal: Ability to identify and utilize available resources and services will improve Outcome: Progressing Goal: Ability to manage health-related needs will improve Outcome: Progressing   Problem: Metabolic: Goal: Ability to maintain appropriate glucose levels will improve Outcome: Progressing   Problem: Nutritional: Goal: Maintenance of adequate nutrition will improve Outcome: Progressing Goal: Progress toward achieving an optimal weight will improve Outcome: Progressing   Problem: Tissue Perfusion: Goal: Adequacy of tissue perfusion will improve Outcome: Progressing
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
## 2022-09-11 NOTE — Progress Notes (Signed)
ID Pt doing better Rt foot pain and swelling better O/e Looks well Patient Vitals for the past 24 hrs:  BP Temp Temp src Pulse Resp SpO2 Weight  09/11/22 0733 125/76 97.9 F (36.6 C) Oral 78 18 96 % --  09/11/22 0442 128/62 98.2 F (36.8 C) Oral 76 18 96 % 121.5 kg  09/10/22 1928 107/66 97.7 F (36.5 C) Oral 82 18 98 % --    Chest b/l air entry [id number]    Labs    Latest Ref Rng & Units 09/11/2022    4:17 AM 09/10/2022    6:50 AM 09/09/2022    3:29 AM  CBC  WBC 4.0 - 10.5 K/uL 3.3  4.5  7.4   Hemoglobin 13.0 - 17.0 g/dL 9.8  9.3  16.1   Hematocrit 39.0 - 52.0 % 32.3  30.9  32.7   Platelets 150 - 400 K/uL 250  239  221        Latest Ref Rng & Units 09/11/2022    4:17 AM 09/10/2022    6:50 AM 09/09/2022    3:29 AM  CMP  Glucose 70 - 99 mg/dL 91  99    BUN 8 - 23 mg/dL 9  16    Creatinine 0.96 - 1.24 mg/dL 0.45  4.09  8.11   Sodium 135 - 145 mmol/L 139  140    Potassium 3.5 - 5.1 mmol/L 3.3  3.2    Chloride 98 - 111 mmol/L 106  104    CO2 22 - 32 mmol/L 27  25    Calcium 8.9 - 10.3 mg/dL 7.4  7.2      Impression/recommendation Cellulitis of the rt foot  ( which has a TMA)with a small ulcer which is superficial on the plantar surface The cellulitis has resolved with IV antibiotic- can be switched to PO cefadroxil 1 gram BID x5 days  Pt has left AKA, so he needs special boot for the rt foot so as to off load pressure   Mild leucopenia today- will check labs next week- He will come to the hospital lab he said  CAD s/p stent  Discussed the management with patient and hospitalist

## 2022-09-11 NOTE — Discharge Summary (Signed)
[name]:[id number] DOB: [birth year] DOA: 09/08/2022  PCP: Carlean Jews, PA-C  Admit date: 09/08/2022 Discharge date: 09/11/2022  Time spent: 35 minutes  Recommendations for Outpatient Follow-up:  Pcp and podiatry f/u     Discharge Diagnoses:  Principal Problem:   Diabetic foot infection [facility]) Active Problems:   Cellulitis of right lower extremity   Discharge Condition: stable  Diet recommendation: carb modified heart healthy  Filed Weights   09/08/22 1911 09/11/22 0442  Weight: 120.2 kg 121.5 kg    History of present illness:  From admission h and p Charles [name] is a 62 y.o. male with medical history significant of CAD s/p PCI to LAD, type 2 diabetes, hypothyroidism, hypertension, hyperlipidemia, left foot osteomyelitis s/p BKA (04/09/2022),  Right TMA, who presents with right foot plantar ulcer with associated swelling and pain over the last 10 days that has been progressive. He notes no odor or drainage but has noted increase pain and swelling. He denies any associated fever/chills/ n/v/d/ sob/chest pain. He notes he is concerned as prior to have his left AKA  he had a similar ulcer.   Hospital Course:  Patient presents with diabetic foot infection. Plantar ulcer right foot, prior TMA that foot, also with pain and swelling. Initial concern for osteo but CT no evidence of that (unable to obtain mri due to spinal stimulator). ID and podiatry consulted. Podiatry performed debridement. Patient was treated with cefazolin. Swelling and pain markedly improved. Will discharge with 5 day course cephalosporin, close podiatry f/u. Post-op shoe for offloading provided, would probably benefit from a custom orthotic. Patient also requested a wheelchair which we have ordered.  Procedures: See above   Consultations: ID, podiatry  Discharge Exam: Vitals:   09/11/22 0442 09/11/22 0733  BP: 128/62 125/76  Pulse: 76 78  Resp: 18 18  Temp: 98.2 F (36.8 C) 97.9 F  (36.6 C)  SpO2: 96% 96%    General: NAD Cardiovascular: RRR Respiratory: CTAB Ext:  Discharge Instructions   Discharge Instructions     Diet - low sodium heart healthy   Complete by: As directed    Discharge wound care:   Complete by: As directed    Cover with foam dressing and change every 3 days   Increase activity slowly   Complete by: As directed       Allergies as of 09/11/2022       Reactions   Lisinopril Cough        Medication List     TAKE these medications    albuterol 108 (90 Base) MCG/ACT inhaler Commonly known as: VENTOLIN HFA Inhale 2 puffs into the lungs every 6 (six) hours as needed for wheezing or shortness of breath.   amLODipine 5 MG tablet Commonly known as: NORVASC Take 1 tablet (5 mg total) by mouth daily.   ascorbic acid 500 MG tablet Commonly known as: VITAMIN C Take 1 tablet (500 mg total) by mouth daily.   atorvastatin 40 MG tablet Commonly known as: LIPITOR TAKE ONE TABLET BY MOUTH AT BEDTIME FOR CHLOESTEROL   buPROPion 150 MG 24 hr tablet Commonly known as: WELLBUTRIN XL TAKE 1 TABLET BY MOUTH DAILY   carvedilol 3.125 MG tablet Commonly known as: COREG TAKE ONE (1) TABLET BY MOUTH TWO TIMES PER DAY WITH MEALS What changed: See the new instructions.   cefadroxil 500 MG capsule Commonly known as: DURICEF Take 2 capsules (1,000 mg total) by mouth 2 (two) times daily for 5 days.   DULoxetine  60 MG capsule Commonly known as: CYMBALTA TAKE 1 CAPSULE BY MOUTH DAILY.   levothyroxine 75 MCG tablet Commonly known as: SYNTHROID TAKE ONE TABLET BY MOUTH EVERY DAY WITH BREAKFAST   lisdexamfetamine 40 MG capsule Commonly known as: Vyvanse Take 1 capsule (40 mg total) by mouth every morning.   metaxalone 800 MG tablet Commonly known as: SKELAXIN Take 1,600 mg by mouth 3 (three) times daily.   oxyCODONE 15 MG immediate release tablet Commonly known as: ROXICODONE Take 1 tablet (15 mg total) by mouth every 6 (six) hours as  needed for pain.   senna 8.6 MG Tabs tablet Commonly known as: SENOKOT Take 1 tablet (8.6 mg total) by mouth daily as needed for mild constipation.   sildenafil 20 MG tablet Commonly known as: REVATIO TAKE 1 TABLET BY MOUTH DAILY AS NEEDED FOR ERECTILE DYSFUNCTION   vitamin D3 25 MCG tablet Commonly known as: CHOLECALCIFEROL Take 2 tablets (2,000 Units total) by mouth daily.   zinc sulfate 220 (50 Zn) MG capsule Take 1 capsule (220 mg total) by mouth daily.   zolpidem 10 MG tablet Commonly known as: AMBIEN Take 1 tablet (10 mg total) by mouth at bedtime as needed. for sleep               Discharge Care Instructions  (From admission, onward)           Start     Ordered   09/11/22 0000  Discharge wound care:       Comments: Cover with foam dressing and change every 3 days   09/11/22 1022           Allergies  Allergen Reactions   Lisinopril Cough    Follow-up Information     Linus Galas, DPM Follow up.   Specialty: Podiatry Contact information: [address] [place] [address] [phone]         Carlean Jews, PA-C Follow up.   Specialty: Physician Assistant Contact information: [address] [place] [address] [phone]                  The results of significant diagnostics from this hospitalization (including imaging, microbiology, ancillary and laboratory) are listed below for reference.    Significant Diagnostic Studies: CT FOOT RIGHT W CONTRAST  Result Date: 09/09/2022 CLINICAL DATA:  Progressively worsening right foot pain over the past 10 days, with redness and swelling. Plantar foot ulcer. History of transmetatarsal amputation. EXAM: CT OF THE LOWER RIGHT EXTREMITY WITH CONTRAST TECHNIQUE: Multidetector CT imaging of the lower right extremity was performed according to the standard protocol following intravenous contrast administration. RADIATION DOSE REDUCTION: This exam was performed according to the  departmental dose-optimization program which includes automated exposure control, adjustment of the mA and/or kV according to patient size and/or use of iterative reconstruction technique. CONTRAST:  OMNIPAQUE IOHEXOL 300 MG/ML  SOLN COMPARISON:  Right foot x-rays from yesterday. CT right foot dated December 02, 2018. FINDINGS: Bones/Joint/Cartilage Postsurgical changes from prior transmetatarsal amputation. Amputation margins are sharp. No bony destruction or periosteal reaction. No fracture or dislocation. Mild midfoot and hindfoot osteoarthritis. Healed calcaneal osteotomy status post fixation with a single cannulated screw. No joint effusion. Ligaments Ligaments are suboptimally evaluated by CT. Muscles and Tendons Postsurgical changes of the flexor and extensor tendons. Otherwise grossly. No tenosynovitis. Mild-to-moderate thickening of the proximal and mid Achilles tendon. Soft tissue Midline superficial ulceration along the plantar aspect of the amputation stump with underlying ill-defined hypodensity, but no  discrete rim enhancing fluid collection. Diffuse soft tissue swelling of the foot. No soft tissue mass. IMPRESSION: 1. Midline superficial ulceration along the plantar aspect of the amputation stump with underlying phlegmon, but no discrete abscess. 2. No CT evidence of osteomyelitis. 3. Achilles tendinosis. Electronically Signed   By: Obie Dredge M.D.   On: 09/09/2022 10:02   DG Foot Complete Right  Result Date: 09/08/2022 CLINICAL DATA:  Pain EXAM: RIGHT FOOT COMPLETE - 3+ VIEW COMPARISON:  Right foot x-ray 12/03/2018 FINDINGS: There has been prior amputation of the mid and distal first through fifth metatarsals. There is no acute fracture or dislocation. No cortical erosions are identified. There is overlying soft tissue swelling at the surgical site. Calcaneal screw is present. Posterior and plantar calcaneal spurs are present. There are mild degenerative osteophytes of the dorsal  midfoot. IMPRESSION: 1. Prior amputation of the mid and distal first through fifth metatarsals with overlying soft tissue swelling. 2. No acute bony abnormality. 3. There is high clinical concern for osteomyelitis, further evaluation with MRI would be useful. Electronically Signed   By: Darliss Cheney M.D.   On: 09/08/2022 19:43    Microbiology: Recent Results (from the past 240 hour(s))  Blood culture (routine x 2)     Status: None (Preliminary result)   Collection Time: 09/08/22  7:16 PM   Specimen: BLOOD  Result Value Ref Range Status   Specimen Description BLOOD RIGHT ANTECUBITAL  Final   Special Requests   Final    BOTTLES DRAWN AEROBIC AND ANAEROBIC Blood Culture results may not be optimal due to an excessive volume of blood received in culture bottles   Culture   Final    NO GROWTH 3 DAYS Performed at [facility], [address], [place], [place] [address]    Report Status PENDING  Incomplete  Blood culture (routine x 2)     Status: None (Preliminary result)   Collection Time: 09/08/22  7:16 PM   Specimen: BLOOD  Result Value Ref Range Status   Specimen Description BLOOD LEFT ANTECUBITAL  Final   Special Requests   Final    BOTTLES DRAWN AEROBIC AND ANAEROBIC Blood Culture results may not be optimal due to an excessive volume of blood received in culture bottles   Culture   Final    NO GROWTH 3 DAYS Performed at [facility], [address] [place], [place] [address]    Report Status PENDING  Incomplete     Labs: Basic Metabolic Panel: Recent Labs  Lab 09/08/22 1916 09/09/22 0329 09/10/22 0645 09/10/22 0650 09/11/22 0417  NA 136  --   --  140 139  K 2.7*  --   --  3.2* 3.3*  CL 96*  --   --  104 106  CO2 24  --   --  25 27  GLUCOSE 100*  --   --  99 91  BUN 15  --   --  16 9  CREATININE 1.49* 1.35*  --  1.02 0.87  CALCIUM 8.5*  --   --  7.2* 7.4*  MG  --   --   --  1.1* 2.1  PHOS  --   --  3.8  --   --    Liver Function Tests: Recent Labs   Lab 09/08/22 1916  AST 49*  ALT 38  ALKPHOS 97  BILITOT 1.0  PROT 7.9  ALBUMIN 3.8   No results for input(s): "LIPASE", "AMYLASE" in the last 168 hours.  No results for input(s): "AMMONIA" in the last 168 hours. CBC: Recent Labs  Lab 09/08/22 1916 09/09/22 0329 09/10/22 0650 09/11/22 0417  WBC 11.2* 7.4 4.5 3.3*  NEUTROABS 8.4*  --   --   --   HGB 12.0* 10.2* 9.3* 9.8*  HCT 38.7* 32.7* 30.9* 32.3*  MCV 83.6 84.1 85.8 87.1  PLT 319 221 239 250   Cardiac Enzymes: No results for input(s): "CKTOTAL", "CKMB", "CKMBINDEX", "TROPONINI" in the last 168 hours. BNP: BNP (last 3 results) No results for input(s): "BNP" in the last 8760 hours.  ProBNP (last 3 results) No results for input(s): "PROBNP" in the last 8760 hours.  CBG: Recent Labs  Lab 09/10/22 0952 09/10/22 1222 09/10/22 1726 09/10/22 2127 09/11/22 0730  GLUCAP 115* 115* 109* 86 82       Signed:  Silvano Bilis MD.  [facility] 09/11/2022, 10:27 AM

## 2022-09-11 NOTE — Discharge Instructions (Signed)
Next time you visit your PCP, ask them to check these vitamin levels to assess for nutritional deficiencies:   Iron Folic Acid Thiamine Copper Zinc Vitamin B-12 Vitamin D Vitamin A Vitamin E Vitamin K Calcium

## 2022-09-11 NOTE — Progress Notes (Signed)
    Durable Medical Equipment  (From admission, onward)           Start     Ordered   09/11/22 1032  DME standard manual wheelchair with seat cushion  Once       Comments: Patient suffers from left above the knee amputation and right transmetatarsal amputation. which impairs their ability to perform daily activities like bathing, dressing, grooming, and toileting in the home.  A walker will not resolve issue with performing activities of daily living. A wheelchair will allow patient to safely perform daily activities. Patient can safely propel the wheelchair in the home or has a caregiver who can provide assistance. Length of need Lifetime. Accessories: elevating leg rests (ELRs), wheel locks, extensions and anti-tippers.   09/11/22 1032

## 2022-09-11 NOTE — Progress Notes (Signed)
Initial Nutrition Assessment  DOCUMENTATION CODES:   Obesity unspecified  INTERVENTION:   -MVI with minerals BID -550 mg calcium carbonate TID -Double protein portions with meals -1 packet Juven BID, each packet provides 95 calories, 2.5 grams of protein (collagen), and 9.8 grams of carbohydrate (3 grams sugar); also contains 7 grams of L-arginine and L-glutamine, 300 mg vitamin C, 15 mg vitamin E, 1.2 mcg vitamin B-12, 9.5 mg zinc, 200 mg calcium, and 1.5 g  Calcium Beta-hydroxy-Beta-methylbutyrate to support wound healing -Continue 500 mg vitamin C BID -Continue 220 mg zinc sulfate daily  -Liberalize diet to 2 gram sodium for wider variety of meal selections -Check labs to assess for nutritional deficiencies secondary to history of gastric bypass surgery: Iron Folic Acid, Thiamine, Copper, Zinc, Vitamin B-12, Vitamin D, Vitamin A, Vitamin E, Vitamin K, and Calcium  NUTRITION DIAGNOSIS:   Increased nutrient needs related to wound healing as evidenced by estimated needs.  GOAL:   Patient will meet greater than or equal to 90% of their needs  MONITOR:   PO intake, Supplement acceptance  REASON FOR ASSESSMENT:   Consult Assessment of nutrition requirement/status, Wound healing  ASSESSMENT:   Pt with history of CAD status post PCI, DM2, hypothyroidism, HTN, HLD, left foot osteomyelitis status post BKA 04/09/2002, right TMA presents with right foot plantar ulcer with associated swelling and pain over the last 10 days PTA.  X-ray did not show any acute abnormality but there is high concern for possible osteomyelitis.  Pt admitted with rt DM foot ulcer.   5/22- excision debridement by podiatry  Reviewed I/O's: +180 ml x 24 hours and +2.3 L since admission  UOP: 925 ml x 24 hours  Per MD, concern for osteomyelitis.   Spoke with pt at bedside, who reports feeling better today. Noted pt consumed all of his breakfast at bedside; pt stated "I was really hungry today". Pt  familiar to this RD due to previous admissions. Pt reports that he has been depressed since undergoing lt AKA in 04/2022. He reports that this has impacted his mobility greatly and is very sedentary, but can ambulate small distances with his walker. Pt shares that he used to weight 235# prior to amputation and estimates that he gained about 30 pounds since then. Reviewed wt hx; pt has experienced a 1.9% wt loss over the past 6 months, which is not significant for time frame.   Pt shares that he does not feel like doing much and openly admitted to this RD that he used to be compliant with his vitamin supplementation, but has not taken these in about 2-3 months secondary to his depression. Pt is interested in resuming his supplements and amenable to check labs. Pt became tearful at time of visit; RD provided emotional support and supportive presence. Offered spiritual care consult, however, pt politely declined stating he has a strong church community that support him and also receives medication for depression, which he finds helpful.   Per pt, he consumes about one meal per day, which he gets at a diner near his house (beef tips and gravy and vegetables). He denies snacking, but drinks water, unsweetened tea, and Coke Zero.   Discussed importance of good meal and supplement intake to promote healing. Pt amenable to supplements to support wound healing as well as any vitamin supplementation that will help with his history of gastric bypass.   ADDENDUM: Noted pt to discharge home today. Notified pt of recommended labs and to follow-up with his PCP.  Medications reviewed and include vitamin C and zinc sulfate.  Lab Results  Component Value Date   HGBA1C 5.1 08/03/2022   PTA DM medications are none.   Labs reviewed: K: 3.3, CBGS: 82 (inpatient orders for glycemic control are 0-6 units insulin aspart TID with meals).    NUTRITION - FOCUSED PHYSICAL EXAM:  Flowsheet Row Most Recent Value  Orbital  Region No depletion  Upper Arm Region No depletion  Thoracic and Lumbar Region No depletion  Buccal Region No depletion  Temple Region No depletion  Clavicle Bone Region No depletion  Clavicle and Acromion Bone Region No depletion  Scapular Bone Region No depletion  Dorsal Hand No depletion  Patellar Region No depletion  Anterior Thigh Region No depletion  Posterior Calf Region No depletion  Edema (RD Assessment) Mild  Hair Reviewed  Eyes Reviewed  Mouth Reviewed  Skin Reviewed  Nails Reviewed       Diet Order:   Diet Order             Diet - low sodium heart healthy           Diet - low sodium heart healthy           Diet heart healthy/carb modified Room service appropriate? Yes; Fluid consistency: Thin  Diet effective now                   EDUCATION NEEDS:   Education needs have been addressed  Skin:  Skin Assessment: Skin Integrity Issues: Skin Integrity Issues:: Incisions Incisions: closed rt foot  Last BM:  09/10/22  Height:   Ht Readings from Last 1 Encounters:  09/08/22 6\' 1"  (1.854 m)    Weight:   Wt Readings from Last 1 Encounters:  09/11/22 121.5 kg    Ideal Body Weight:  76.9 kg (adjusted for lt AKA)  BMI:  Body mass index is 35.34 kg/m.  Estimated Nutritional Needs:   Kcal:  2100-2300  Protein:  115-130 grams  Fluid:  2.0-2.2 L    Levada Schilling, RD, LDN, CDCES Registered Dietitian II Certified Diabetes Care and Education Specialist Please refer to [facility] for RD and/or RD on-call/weekend/after hours pager

## 2022-09-11 NOTE — Telephone Encounter (Signed)
Last 4/24 please send

## 2022-09-13 LAB — CULTURE, BLOOD (ROUTINE X 2)
Culture: NO GROWTH
Culture: NO GROWTH

## 2022-10-13 ENCOUNTER — Emergency Department (EMERGENCY_DEPARTMENT_HOSPITAL)
Admission: EM | Admit: 2022-10-13 | Discharge: 2022-10-14 | Disposition: A | Discharge status: Home / Self Care | Payer: Medicaid Other | Source: Home / Self Care | Attending: Emergency Medicine | Admitting: Emergency Medicine

## 2022-10-13 DIAGNOSIS — F419 Anxiety disorder, unspecified: Secondary | ICD-10-CM | POA: Insufficient documentation

## 2022-10-13 DIAGNOSIS — Y908 Blood alcohol level of 240 mg/100 ml or more: Secondary | ICD-10-CM | POA: Insufficient documentation

## 2022-10-13 DIAGNOSIS — E876 Hypokalemia: Secondary | ICD-10-CM | POA: Insufficient documentation

## 2022-10-13 DIAGNOSIS — F329 Major depressive disorder, single episode, unspecified: Secondary | ICD-10-CM | POA: Insufficient documentation

## 2022-10-13 DIAGNOSIS — F1014 Alcohol abuse with alcohol-induced mood disorder: Secondary | ICD-10-CM | POA: Insufficient documentation

## 2022-10-13 DIAGNOSIS — I10 Essential (primary) hypertension: Secondary | ICD-10-CM | POA: Insufficient documentation

## 2022-10-13 DIAGNOSIS — R7401 Elevation of levels of liver transaminase levels: Secondary | ICD-10-CM | POA: Insufficient documentation

## 2022-10-13 DIAGNOSIS — E119 Type 2 diabetes mellitus without complications: Secondary | ICD-10-CM | POA: Insufficient documentation

## 2022-10-13 DIAGNOSIS — R1011 Right upper quadrant pain: Secondary | ICD-10-CM | POA: Insufficient documentation

## 2022-10-13 DIAGNOSIS — R45851 Suicidal ideations: Secondary | ICD-10-CM | POA: Insufficient documentation

## 2022-10-13 DIAGNOSIS — Z79899 Other long term (current) drug therapy: Secondary | ICD-10-CM | POA: Insufficient documentation

## 2022-10-13 DIAGNOSIS — F10929 Alcohol use, unspecified with intoxication, unspecified: Secondary | ICD-10-CM

## 2022-10-13 DIAGNOSIS — F32A Depression, unspecified: Secondary | ICD-10-CM

## 2022-10-13 LAB — CBC
HCT: 39 % (ref 39.0–52.0)
Hemoglobin: 12.5 g/dL — ABNORMAL LOW (ref 13.0–17.0)
MCH: 27.4 pg (ref 26.0–34.0)
MCHC: 32.1 g/dL (ref 30.0–36.0)
MCV: 85.5 fL (ref 80.0–100.0)
Platelets: 415 10*3/uL — ABNORMAL HIGH (ref 150–400)
RBC: 4.56 MIL/uL (ref 4.22–5.81)
RDW: 19.8 % — ABNORMAL HIGH (ref 11.5–15.5)
WBC: 5.1 10*3/uL (ref 4.0–10.5)
nRBC: 0 % (ref 0.0–0.2)

## 2022-10-13 LAB — COMPREHENSIVE METABOLIC PANEL
ALT: 141 U/L — ABNORMAL HIGH (ref 0–44)
AST: 530 U/L — ABNORMAL HIGH (ref 15–41)
Albumin: 3.4 g/dL — ABNORMAL LOW (ref 3.5–5.0)
Alkaline Phosphatase: 141 U/L — ABNORMAL HIGH (ref 38–126)
Anion gap: 23 — ABNORMAL HIGH (ref 5–15)
BUN: 15 mg/dL (ref 8–23)
CO2: 16 mmol/L — ABNORMAL LOW (ref 22–32)
Calcium: 7.6 mg/dL — ABNORMAL LOW (ref 8.9–10.3)
Chloride: 96 mmol/L — ABNORMAL LOW (ref 98–111)
Creatinine, Ser: 1.16 mg/dL (ref 0.61–1.24)
GFR, Estimated: >60 mL/min (ref >60)
Glucose, Bld: 73 mg/dL (ref 70–99)
Potassium: 2.7 mmol/L — CL (ref 3.5–5.1)
Sodium: 135 mmol/L (ref 135–145)
Total Bilirubin: 1.6 mg/dL — ABNORMAL HIGH (ref 0.3–1.2)
Total Protein: 7.1 g/dL (ref 6.5–8.1)

## 2022-10-13 LAB — SALICYLATE LEVEL: Salicylate Lvl: <7 mg/dL — ABNORMAL LOW (ref 7.0–30.0)

## 2022-10-13 LAB — ETHANOL: Alcohol, Ethyl (B): 417 mg/dL (ref <10)

## 2022-10-13 LAB — ACETAMINOPHEN LEVEL: Acetaminophen (Tylenol), Serum: <10 ug/mL — ABNORMAL LOW (ref 10–30)

## 2022-10-13 MED ORDER — LACTATED RINGERS IV BOLUS
1000.0000 mL | Freq: Once | INTRAVENOUS | Status: AC
  Administered 2022-10-13: 1000 mL via INTRAVENOUS

## 2022-10-13 MED ORDER — POTASSIUM CHLORIDE CRYS ER 20 MEQ PO TBCR
40.0000 meq | EXTENDED_RELEASE_TABLET | Freq: Two times a day (BID) | ORAL | Status: DC
  Administered 2022-10-13 – 2022-10-14 (×2): 40 meq via ORAL
  Filled 2022-10-13 (×2): qty 2

## 2022-10-13 MED ORDER — POTASSIUM CHLORIDE 10 MEQ/100ML IV SOLN
10.0000 meq | INTRAVENOUS | Status: AC
  Administered 2022-10-14 (×3): 10 meq via INTRAVENOUS
  Filled 2022-10-13 (×2): qty 100

## 2022-10-13 MED ORDER — MAGNESIUM SULFATE 2 GM/50ML IV SOLN
2.0000 g | Freq: Once | INTRAVENOUS | Status: AC
  Administered 2022-10-14: 2 g via INTRAVENOUS
  Filled 2022-10-13: qty 50

## 2022-10-13 MED ORDER — LORAZEPAM 1 MG PO TABS
1.0000 mg | ORAL_TABLET | Freq: Once | ORAL | Status: AC
  Administered 2022-10-13: 1 mg via ORAL
  Filled 2022-10-13: qty 1

## 2022-10-13 NOTE — ED Notes (Signed)
No report given by EMS. Per MD pt brought in for SI. Pt keeps saying "how the fuck I get here? Why the fuck am I here?"

## 2022-10-13 NOTE — ED Provider Notes (Signed)
[facility] Provider Note    Event Date/Time   First MD Initiated Contact with Patient 10/13/22 1829     (approximate)  History   Chief Complaint: Depression, ethanol use  HPI  Charles [name] is a 62 y.o. male with a past medical history of anxiety, chronic pain, hypertension, hyperlipidemia, diabetes, presents to the emergency department with EMS.  According to EMS they were called out to the patient's residence for depression.  They noted the patient's blood glucose to be slightly low at 57 and gave glucagon.  Here patient continues to state "why the fuck am I here" then during my examination states "just shoot me in the head."  I spoke to the patient in more depth he states his wife died yesterday from a heart attack.  Patient is tearful at times throughout exam.  Does admit to significant alcohol intake today.  When I asked how much he states "a lot."  Physical Exam   Triage Vital Signs: ED Triage Vitals [10/13/22 1844]  Enc Vitals Group     BP      Pulse      Resp      Temp      Temp src      SpO2      Weight      Height      Head Circumference      Peak Flow      Pain Score 0     Pain Loc      Pain Edu?      Excl. in GC?     Most recent vital signs: There were no vitals filed for this visit.  General: Awake, no distress.  Fall at times.  Appears intoxicated. CV:  Good peripheral perfusion.  Regular rate and rhythm  Resp:  Normal effort.  Equal breath sounds bilaterally.  Abd:  No distention.  Soft, nontender.  No rebound or guarding. Other:   Status post lower extremity amputation  ED Results / Procedures / Treatments   MEDICATIONS ORDERED IN ED: Medications - No data to display   IMPRESSION / MDM / ASSESSMENT AND PLAN / ED COURSE  I reviewed the triage vital signs and the nursing notes.  Patient's presentation is most consistent with acute presentation with potential threat to life or bodily function.  Patient presents to the  emergency department with significant alcohol intoxication as well as depression tearful throughout examination states that his wife died yesterday from a heart attack.  Patient then states "just shoot me" and points to his head.  Will place patient under an IVC will have psychiatry and TTS evaluate.  We will check labs including CBC chemistry given the initial borderline hypoglycemia, check an ethanol level and continue to closely monitor.    Labs have resulted showing mild hypokalemia I have ordered oral potassium.  Chemistry also shows significant liver function test elevation we will obtain a right upper quadrant ultrasound to further evaluate.  Anion gap of 23 possibly consistent with alcoholic ketoacidosis.  We will IV hydrate.  Ethanol significantly elevated 417.  Reassuringly salicylate and acetaminophen are negative.  Patient did desat into the 80s while sleeping however given significant intoxication suspect this is just due to heavy sleeping.  Placed on 2 L now satting in the 90s.  Patient remains under IVC awaiting psychiatric evaluation.  Psychiatry has been biopsy the patient but due to significant alcohol intoxication patient was acting belligerent they were unable to adequately evaluate.  Patient care signed out to oncoming provider.  FINAL CLINICAL IMPRESSION(S) / ED DIAGNOSES   Alcohol intoxication Suicidal ideation Depression   Note:  This document was prepared using Dragon voice recognition software and may include unintentional dictation errors.   Minna Antis, MD 10/13/22 2257

## 2022-10-14 ENCOUNTER — Emergency Department: Payer: Medicaid Other

## 2022-10-14 ENCOUNTER — Inpatient Hospital Stay
Admission: AD | Admit: 2022-10-14 | Discharge: 2022-10-20 | DRG: 881 | Disposition: A | Discharge status: Home / Self Care | Payer: Medicaid Other | Source: Intra-hospital | Attending: Psychiatry | Admitting: Psychiatry

## 2022-10-14 ENCOUNTER — Other Ambulatory Visit: Payer: Self-pay

## 2022-10-14 ENCOUNTER — Encounter: Payer: Self-pay | Admitting: Nurse Practitioner

## 2022-10-14 DIAGNOSIS — Z634 Disappearance and death of family member: Secondary | ICD-10-CM | POA: Diagnosis not present

## 2022-10-14 DIAGNOSIS — Z5982 Transportation insecurity: Secondary | ICD-10-CM | POA: Diagnosis not present

## 2022-10-14 DIAGNOSIS — Z9682 Presence of neurostimulator: Secondary | ICD-10-CM

## 2022-10-14 DIAGNOSIS — F322 Major depressive disorder, single episode, severe without psychotic features: Secondary | ICD-10-CM | POA: Diagnosis not present

## 2022-10-14 DIAGNOSIS — E039 Hypothyroidism, unspecified: Secondary | ICD-10-CM | POA: Diagnosis present

## 2022-10-14 DIAGNOSIS — Z9884 Bariatric surgery status: Secondary | ICD-10-CM | POA: Diagnosis not present

## 2022-10-14 DIAGNOSIS — I2510 Atherosclerotic heart disease of native coronary artery without angina pectoris: Secondary | ICD-10-CM | POA: Diagnosis present

## 2022-10-14 DIAGNOSIS — E785 Hyperlipidemia, unspecified: Secondary | ICD-10-CM | POA: Diagnosis present

## 2022-10-14 DIAGNOSIS — E876 Hypokalemia: Secondary | ICD-10-CM | POA: Diagnosis present

## 2022-10-14 DIAGNOSIS — Z79891 Long term (current) use of opiate analgesic: Secondary | ICD-10-CM

## 2022-10-14 DIAGNOSIS — F332 Major depressive disorder, recurrent severe without psychotic features: Secondary | ICD-10-CM | POA: Diagnosis not present

## 2022-10-14 DIAGNOSIS — Z955 Presence of coronary angioplasty implant and graft: Secondary | ICD-10-CM | POA: Diagnosis not present

## 2022-10-14 DIAGNOSIS — I10 Essential (primary) hypertension: Secondary | ICD-10-CM | POA: Diagnosis present

## 2022-10-14 DIAGNOSIS — R45851 Suicidal ideations: Secondary | ICD-10-CM | POA: Diagnosis not present

## 2022-10-14 DIAGNOSIS — F329 Major depressive disorder, single episode, unspecified: Secondary | ICD-10-CM | POA: Diagnosis present

## 2022-10-14 DIAGNOSIS — E1151 Type 2 diabetes mellitus with diabetic peripheral angiopathy without gangrene: Secondary | ICD-10-CM | POA: Diagnosis present

## 2022-10-14 DIAGNOSIS — E1142 Type 2 diabetes mellitus with diabetic polyneuropathy: Secondary | ICD-10-CM | POA: Diagnosis present

## 2022-10-14 DIAGNOSIS — Z79899 Other long term (current) drug therapy: Secondary | ICD-10-CM

## 2022-10-14 DIAGNOSIS — G8929 Other chronic pain: Secondary | ICD-10-CM | POA: Diagnosis present

## 2022-10-14 DIAGNOSIS — Z79890 Hormone replacement therapy (postmenopausal): Secondary | ICD-10-CM

## 2022-10-14 DIAGNOSIS — Z8616 Personal history of COVID-19: Secondary | ICD-10-CM

## 2022-10-14 DIAGNOSIS — F331 Major depressive disorder, recurrent, moderate: Secondary | ICD-10-CM

## 2022-10-14 DIAGNOSIS — Z5986 Financial insecurity: Secondary | ICD-10-CM

## 2022-10-14 DIAGNOSIS — F10129 Alcohol abuse with intoxication, unspecified: Secondary | ICD-10-CM | POA: Diagnosis not present

## 2022-10-14 DIAGNOSIS — N400 Enlarged prostate without lower urinary tract symptoms: Secondary | ICD-10-CM | POA: Diagnosis present

## 2022-10-14 LAB — URINE DRUG SCREEN, QUALITATIVE (ARMC ONLY)
Amphetamines, Ur Screen: POSITIVE — AB
Barbiturates, Ur Screen: NOT DETECTED
Benzodiazepine, Ur Scrn: NOT DETECTED
Cannabinoid 50 Ng, Ur ~~LOC~~: NOT DETECTED
Cocaine Metabolite,Ur ~~LOC~~: NOT DETECTED
MDMA (Ecstasy)Ur Screen: NOT DETECTED
Methadone Scn, Ur: NOT DETECTED
Opiate, Ur Screen: NOT DETECTED
Phencyclidine (PCP) Ur S: NOT DETECTED
Tricyclic, Ur Screen: NOT DETECTED

## 2022-10-14 LAB — COMPREHENSIVE METABOLIC PANEL
ALT: 116 U/L — ABNORMAL HIGH (ref 0–44)
AST: 365 U/L — ABNORMAL HIGH (ref 15–41)
Albumin: 2.8 g/dL — ABNORMAL LOW (ref 3.5–5.0)
Alkaline Phosphatase: 126 U/L (ref 38–126)
Anion gap: 13 (ref 5–15)
BUN: 20 mg/dL (ref 8–23)
CO2: 24 mmol/L (ref 22–32)
Calcium: 7.7 mg/dL — ABNORMAL LOW (ref 8.9–10.3)
Chloride: 99 mmol/L (ref 98–111)
Creatinine, Ser: 1.07 mg/dL (ref 0.61–1.24)
GFR, Estimated: >60 mL/min (ref >60)
Glucose, Bld: 228 mg/dL — ABNORMAL HIGH (ref 70–99)
Potassium: 3.3 mmol/L — ABNORMAL LOW (ref 3.5–5.1)
Sodium: 136 mmol/L (ref 135–145)
Total Bilirubin: 0.7 mg/dL (ref 0.3–1.2)
Total Protein: 5.9 g/dL — ABNORMAL LOW (ref 6.5–8.1)

## 2022-10-14 LAB — ETHANOL: Alcohol, Ethyl (B): 107 mg/dL — ABNORMAL HIGH (ref <10)

## 2022-10-14 LAB — CBG MONITORING, ED: Glucose-Capillary: 126 mg/dL — ABNORMAL HIGH (ref 70–99)

## 2022-10-14 MED ORDER — LEVOTHYROXINE SODIUM 50 MCG PO TABS
75.0000 ug | ORAL_TABLET | Freq: Every day | ORAL | Status: DC
  Administered 2022-10-14: 75 ug via ORAL
  Filled 2022-10-14: qty 2

## 2022-10-14 MED ORDER — ATORVASTATIN CALCIUM 20 MG PO TABS
40.0000 mg | ORAL_TABLET | Freq: Every day | ORAL | Status: DC
  Filled 2022-10-14: qty 2

## 2022-10-14 MED ORDER — HALOPERIDOL LACTATE 5 MG/ML IJ SOLN: 5.0000 mg | Freq: Three times a day (TID) | INTRAMUSCULAR | Status: DC | PRN

## 2022-10-14 MED ORDER — AMLODIPINE BESYLATE 5 MG PO TABS
5.0000 mg | ORAL_TABLET | Freq: Every day | ORAL | Status: DC
  Administered 2022-10-14: 5 mg via ORAL
  Filled 2022-10-14: qty 1

## 2022-10-14 MED ORDER — ALBUTEROL SULFATE (2.5 MG/3ML) 0.083% IN NEBU: 2.5000 mg | INHALATION_SOLUTION | Freq: Four times a day (QID) | RESPIRATORY_TRACT | Status: DC | PRN

## 2022-10-14 MED ORDER — HALOPERIDOL 5 MG PO TABS: 5.0000 mg | ORAL_TABLET | Freq: Three times a day (TID) | ORAL | Status: DC | PRN

## 2022-10-14 MED ORDER — OXYCODONE-ACETAMINOPHEN 5-325 MG PO TABS
1.0000 | ORAL_TABLET | Freq: Once | ORAL | Status: AC
  Administered 2022-10-14: 1 via ORAL
  Filled 2022-10-14: qty 1

## 2022-10-14 MED ORDER — AMLODIPINE BESYLATE 5 MG PO TABS
5.0000 mg | ORAL_TABLET | Freq: Every day | ORAL | Status: DC
  Administered 2022-10-15 – 2022-10-20 (×6): 5 mg via ORAL
  Filled 2022-10-14 (×6): qty 1

## 2022-10-14 MED ORDER — GABAPENTIN 100 MG PO CAPS
200.0000 mg | ORAL_CAPSULE | Freq: Once | ORAL | Status: AC
  Administered 2022-10-14: 200 mg via ORAL
  Filled 2022-10-14: qty 2

## 2022-10-14 MED ORDER — LOPERAMIDE HCL 2 MG PO CAPS: 2.0000 mg | ORAL_CAPSULE | ORAL | Status: AC | PRN

## 2022-10-14 MED ORDER — LEVOTHYROXINE SODIUM 75 MCG PO TABS
75.0000 ug | ORAL_TABLET | Freq: Every day | ORAL | Status: DC
  Administered 2022-10-15 – 2022-10-20 (×3): 75 ug via ORAL
  Filled 2022-10-14 (×3): qty 1

## 2022-10-14 MED ORDER — DULOXETINE HCL 60 MG PO CPEP
60.0000 mg | ORAL_CAPSULE | Freq: Every day | ORAL | Status: DC
  Administered 2022-10-14: 60 mg via ORAL
  Filled 2022-10-14: qty 1

## 2022-10-14 MED ORDER — SENNA 8.6 MG PO TABS: 1.0000 | ORAL_TABLET | Freq: Every day | ORAL | Status: DC | PRN

## 2022-10-14 MED ORDER — HYDROXYZINE HCL 25 MG PO TABS
25.0000 mg | ORAL_TABLET | Freq: Three times a day (TID) | ORAL | Status: DC | PRN
  Administered 2022-10-14: 25 mg via ORAL
  Filled 2022-10-14: qty 1

## 2022-10-14 MED ORDER — ALUM & MAG HYDROXIDE-SIMETH 200-200-20 MG/5ML PO SUSP: 30.0000 mL | ORAL | Status: DC | PRN

## 2022-10-14 MED ORDER — LORAZEPAM 2 MG/ML IJ SOLN: 0.0000 mg | Freq: Two times a day (BID) | INTRAMUSCULAR | Status: DC

## 2022-10-14 MED ORDER — CARVEDILOL 6.25 MG PO TABS
3.1250 mg | ORAL_TABLET | Freq: Two times a day (BID) | ORAL | Status: DC
  Administered 2022-10-15 – 2022-10-20 (×11): 3.125 mg via ORAL
  Filled 2022-10-14 (×11): qty 1

## 2022-10-14 MED ORDER — ATORVASTATIN CALCIUM 10 MG PO TABS
40.0000 mg | ORAL_TABLET | Freq: Every day | ORAL | Status: DC
  Administered 2022-10-15 – 2022-10-20 (×6): 40 mg via ORAL
  Filled 2022-10-14 (×6): qty 4

## 2022-10-14 MED ORDER — DULOXETINE HCL 60 MG PO CPEP
60.0000 mg | ORAL_CAPSULE | Freq: Every day | ORAL | Status: DC
  Administered 2022-10-15 – 2022-10-20 (×6): 60 mg via ORAL
  Filled 2022-10-14 (×6): qty 1

## 2022-10-14 MED ORDER — LISDEXAMFETAMINE DIMESYLATE 20 MG PO CAPS
40.0000 mg | ORAL_CAPSULE | Freq: Every day | ORAL | Status: DC
  Administered 2022-10-14: 40 mg via ORAL
  Filled 2022-10-14: qty 2

## 2022-10-14 MED ORDER — THIAMINE HCL 100 MG/ML IJ SOLN: 100.0000 mg | Freq: Once | INTRAMUSCULAR | Status: DC

## 2022-10-14 MED ORDER — LORAZEPAM 2 MG/ML IJ SOLN: 2.0000 mg | Freq: Three times a day (TID) | INTRAMUSCULAR | Status: DC | PRN

## 2022-10-14 MED ORDER — BUPROPION HCL ER (XL) 150 MG PO TB24
150.0000 mg | ORAL_TABLET | Freq: Every day | ORAL | Status: DC
  Administered 2022-10-15 – 2022-10-20 (×6): 150 mg via ORAL
  Filled 2022-10-14 (×7): qty 1

## 2022-10-14 MED ORDER — ADULT MULTIVITAMIN W/MINERALS CH
1.0000 | ORAL_TABLET | Freq: Every day | ORAL | Status: DC
  Administered 2022-10-15 – 2022-10-20 (×6): 1 via ORAL
  Filled 2022-10-14 (×6): qty 1

## 2022-10-14 MED ORDER — THIAMINE HCL 100 MG/ML IJ SOLN
100.0000 mg | Freq: Every day | INTRAMUSCULAR | Status: DC
  Filled 2022-10-14: qty 1

## 2022-10-14 MED ORDER — LORAZEPAM 2 MG PO TABS
0.0000 mg | ORAL_TABLET | Freq: Four times a day (QID) | ORAL | Status: DC
  Administered 2022-10-14: 1 mg via ORAL
  Filled 2022-10-14: qty 1

## 2022-10-14 MED ORDER — LORAZEPAM 1 MG PO TABS: 1.0000 mg | ORAL_TABLET | Freq: Four times a day (QID) | ORAL | Status: AC | PRN

## 2022-10-14 MED ORDER — LORAZEPAM 2 MG/ML IJ SOLN: 0.0000 mg | Freq: Four times a day (QID) | INTRAMUSCULAR | Status: DC

## 2022-10-14 MED ORDER — CARVEDILOL 6.25 MG PO TABS
3.1250 mg | ORAL_TABLET | Freq: Two times a day (BID) | ORAL | Status: DC
  Administered 2022-10-14 (×2): 3.125 mg via ORAL
  Filled 2022-10-14 (×2): qty 1

## 2022-10-14 MED ORDER — DIPHENHYDRAMINE HCL 50 MG/ML IJ SOLN: 50.0000 mg | Freq: Three times a day (TID) | INTRAMUSCULAR | Status: DC | PRN

## 2022-10-14 MED ORDER — TRAZODONE HCL 50 MG PO TABS
50.0000 mg | ORAL_TABLET | Freq: Every evening | ORAL | Status: DC | PRN
  Administered 2022-10-14: 50 mg via ORAL
  Filled 2022-10-14: qty 1

## 2022-10-14 MED ORDER — THIAMINE MONONITRATE 100 MG PO TABS
100.0000 mg | ORAL_TABLET | Freq: Every day | ORAL | Status: DC
  Administered 2022-10-15 – 2022-10-20 (×6): 100 mg via ORAL
  Filled 2022-10-14 (×6): qty 1

## 2022-10-14 MED ORDER — HYDROXYZINE HCL 25 MG PO TABS: 25.0000 mg | ORAL_TABLET | Freq: Four times a day (QID) | ORAL | Status: DC | PRN

## 2022-10-14 MED ORDER — LORAZEPAM 1 MG PO TABS: 2.0000 mg | ORAL_TABLET | Freq: Three times a day (TID) | ORAL | Status: DC | PRN

## 2022-10-14 MED ORDER — MAGNESIUM HYDROXIDE 400 MG/5ML PO SUSP
30.0000 mL | Freq: Every day | ORAL | Status: DC | PRN
  Administered 2022-10-18 – 2022-10-19 (×2): 30 mL via ORAL
  Filled 2022-10-14 (×2): qty 30

## 2022-10-14 MED ORDER — LORAZEPAM 2 MG PO TABS: 0.0000 mg | ORAL_TABLET | Freq: Two times a day (BID) | ORAL | Status: DC

## 2022-10-14 MED ORDER — ACETAMINOPHEN 325 MG PO TABS
650.0000 mg | ORAL_TABLET | Freq: Four times a day (QID) | ORAL | Status: DC | PRN
  Administered 2022-10-14 – 2022-10-19 (×7): 650 mg via ORAL
  Filled 2022-10-14 (×6): qty 2

## 2022-10-14 MED ORDER — ONDANSETRON 4 MG PO TBDP: 4.0000 mg | ORAL_TABLET | Freq: Four times a day (QID) | ORAL | Status: AC | PRN

## 2022-10-14 MED ORDER — LISDEXAMFETAMINE DIMESYLATE 20 MG PO CAPS
40.0000 mg | ORAL_CAPSULE | Freq: Every day | ORAL | Status: DC
  Administered 2022-10-15: 40 mg via ORAL
  Filled 2022-10-14: qty 2

## 2022-10-14 MED ORDER — DIPHENHYDRAMINE HCL 25 MG PO CAPS: 50.0000 mg | ORAL_CAPSULE | Freq: Three times a day (TID) | ORAL | Status: DC | PRN

## 2022-10-14 MED ORDER — THIAMINE MONONITRATE 100 MG PO TABS
100.0000 mg | ORAL_TABLET | Freq: Every day | ORAL | Status: DC
  Administered 2022-10-14: 100 mg via ORAL
  Filled 2022-10-14: qty 1

## 2022-10-14 MED ORDER — BUPROPION HCL ER (XL) 150 MG PO TB24
150.0000 mg | ORAL_TABLET | Freq: Every day | ORAL | Status: DC
  Administered 2022-10-14: 150 mg via ORAL
  Filled 2022-10-14: qty 1

## 2022-10-14 MED ORDER — POTASSIUM CHLORIDE CRYS ER 20 MEQ PO TBCR
40.0000 meq | EXTENDED_RELEASE_TABLET | Freq: Two times a day (BID) | ORAL | Status: AC
  Administered 2022-10-14 – 2022-10-18 (×8): 40 meq via ORAL
  Filled 2022-10-14 (×9): qty 2

## 2022-10-14 NOTE — BH Assessment (Signed)
Writer faxed resulted lab work and [employer] paperwork to [phone].

## 2022-10-14 NOTE — ED Notes (Signed)
Patient currently in room 19 for privacy for tele psych evaluation.

## 2022-10-14 NOTE — Plan of Care (Signed)

## 2022-10-14 NOTE — ED Notes (Signed)
Pt c/o of pins and needle and phantom pain in legs. MD notified.

## 2022-10-14 NOTE — ED Notes (Signed)
Nurse tech states patient had swelling to right hand from IV and stated that another RN discontinued IV.

## 2022-10-14 NOTE — ED Notes (Signed)
During visiting hours, pt was brought a [employer] account planner and his wallet. Pt wallet has 18 cards in it with $210.00 of cash.

## 2022-10-14 NOTE — Tx Team (Signed)
Initial Treatment Plan 10/14/2022 6:01 PM Charles [name] BMW:[id number]    PATIENT STRESSORS: Medication change or noncompliance   Substance abuse     PATIENT STRENGTHS: Communication skills  Motivation for treatment/growth    PATIENT IDENTIFIED PROBLEMS:   "I drink vodka frequently."  "My wife just died."                 DISCHARGE CRITERIA:  Ability to meet basic life and health needs Adequate post-discharge living arrangements Improved stabilization in mood, thinking, and/or behavior Safe-care adequate arrangements made Withdrawal symptoms are absent or subacute and managed without 24-hour nursing intervention  PRELIMINARY DISCHARGE PLAN: Attend aftercare/continuing care group Attend PHP/IOP Attend 12-step recovery group Return to previous living arrangement  PATIENT/FAMILY INVOLVEMENT: This treatment plan has been presented to and reviewed with the patient, Charles [name]. The patient has been given the opportunity to ask questions and make suggestions.   Luane School, RN 10/14/2022, 6:01 PM

## 2022-10-14 NOTE — ED Notes (Signed)
Pt on phone talking with cousin

## 2022-10-14 NOTE — ED Notes (Signed)
Dr. Erma Heritage messaged via secure chat to notify that patient has had potassium infiltrate. Physician notified of pharmacy recommendations.
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
## 2022-10-14 NOTE — ED Notes (Signed)
Report given to nurse [facility]
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
## 2022-10-14 NOTE — BH Assessment (Addendum)
Patient is to be admitted to [facility] today 10/14/22 pending lab results by Dr.  Marlou Porch .  Attending Physician will be Dr. Marlou Porch.   Patient has been assigned to room L27, by [facility] Charge Nurse Whitney.     ER staff is aware of the admission: Fleet Contras, ER Secretary   Dr. Vicente Males, ER MD  Dorene Grebe, Patient's Nurse

## 2022-10-14 NOTE — ED Notes (Signed)
Pt assisted to restroom.  Given phone as requested

## 2022-10-14 NOTE — Progress Notes (Signed)
Patient admitted involuntary to [facility] from [facility] ED with a diagnosis of depression. Patient presents to unit via WC A&Ox4.  Patient states, "my wife died, I got one leg, I'm depressed" Patient's affect is sullen, speech is clear and thoughts are organized. Patient endorses depression and anxiety. Patient currently denies suicidal ideations, homicidal ideations, audio or visual hallucinations and verbally contracts for safety on unit.  Reports chronic back pain 3/10 for which he takes Tylenol at home. Patient has a left BKA and uses a wheelchair. Large bruise noted to patients RUA, all toes removed on right foot. Patient  denies smoking or drug abuse but does endorse daily ETOH use "a couple beers a day." Patient reports living alone with no support system.  Emotional support and reassurance provided throughout admission intake. Afterwards, oriented patient to unit, room and call light, reviewed POC with all questions answered and understanding verbalzied.  Denies any needs at this time.  Will continue to monitor with ongoing Q 15 minute safety checks per unit protocol.

## 2022-10-14 NOTE — ED Notes (Signed)
Trey in pharmacy called regarding potassium infiltration. Recommends either heat or cold packs to site. States that patient may also receive hyaluronidase injections if provider wants patient to receive them.

## 2022-10-14 NOTE — ED Notes (Signed)
Pt not wearing his o2 at this time. Pt sts " I only wear it when I need it." Pt sts that he is not needing o2 at this time. VS WNL

## 2022-10-14 NOTE — ED Notes (Signed)
RN notified Kim,RN at [facility] about pt PRN o2 use and current VS. Per Kim,RN I will be looking forward to see him.

## 2022-10-14 NOTE — ED Provider Notes (Signed)
Patient seen for acute suicidal ideation and significant alcohol intoxication. IVC placed. Initial labs likely consistent with alcoholic ketoacidosis. Plan to give fluids, f/u repeat CMP and await psych dispo.  Of note, U/S obtained showed question of mild GB wall thickening. He has no RUQ TTP, and his elevated liver enzymes are much more c/w alcoholic hepatitis.  Repeat bili is normal and he has no ongoing RUQ pain. Do not clinically suspect cholecystitis.  Repeat CMP shows improved acidosis and hypokalemia. Ethanol 107. Will f/u psych dispo. Home meds ordered.   Shaune Pollack, MD 10/14/22 [phone]

## 2022-10-14 NOTE — BH Assessment (Signed)
Per [facility] Alcario Drought), patient to be referred out of system.  Referral information for Psychiatric Hospitalization faxed to;   [facility] [phone]- [phone]) No available beds  [facility] [phone]- [phone]),   [facility] [phone]) [phone]  [facility] [phone]),  [address] [phone]),   [facility] [phone] -or- [phone]),   [facility] [phone])  [place] [phone] or [phone]),   [facility] [phone])

## 2022-10-14 NOTE — ED Notes (Signed)
Pt sister who drove from [place] is visiting with pt now. Pt relations, security, and this RN nearby.

## 2022-10-14 NOTE — ED Notes (Signed)
Safety bed raised to appropriate level for pt to stand and pivot to wheelchair due to left leg amputation. Assisted to bathroom and back to bed.

## 2022-10-14 NOTE — ED Notes (Signed)
Pharmacy contacted for lipitor

## 2022-10-14 NOTE — ED Notes (Signed)
Pt endorsed that he drinks a little less than half a gallon of vodka on the weekends. Pt denies anxiety, tremors, auditory/visual hallucinations.

## 2022-10-14 NOTE — ED Notes (Signed)
Pt eating breakfast tray. Given grape juice as requested.  O2 tank replaced.

## 2022-10-14 NOTE — ED Notes (Signed)
Sergio called RN from [facility] sts " We are not able to take care of this pt even on PRN o2 usage due to him being on a psych unit."

## 2022-10-14 NOTE — BH Assessment (Signed)
BED AVAILABILITY TIME IS CURRENTLY PENDING RE-DRAWN OF LAB WORK  Patient has been accepted to [facility].  Patient assigned to [facility] Accepting physician is Dr. Zigmund Gottron.  Call report to [phone].  Representative was [employer].   ER Staff is aware of it:  Carlene ER Secretary  Dr. Erma Heritage, ER MD  Fleet Contras Patient's Nurse       Address: [address], [facility] [place]  Facility is requesting the following labs be redrawn: Potassium and BAL

## 2022-10-14 NOTE — ED Notes (Addendum)
Daughter [place] updated. Stated he consumes 1 gallon of vodka a day. Stated both pt and pt's wife has hx of narcotic abuse. Stated she is available if needed. Stated he has two dogs at home that are alone at this time.

## 2022-10-14 NOTE — ED Notes (Signed)
Pt assisted to bathroom with wheelchair.

## 2022-10-14 NOTE — ED Notes (Signed)
Provided snack box and water 

## 2022-10-14 NOTE — Progress Notes (Signed)
   10/14/22 2000  Psych Admission Type (Psych Patients Only)  Admission Status Involuntary  Psychosocial Assessment  Patient Complaints Depression;Other (Comment);Loneliness (grieving the loss of his wife (died on 11-24-22))  Eye Contact Fair  Facial Expression Sad  Affect Flat  Speech Soft  Interaction Assertive  Motor Activity Slow  Appearance/Hygiene In scrubs  Behavior Characteristics Cooperative  Mood Depressed;Sad  Aggressive Behavior  Effect No apparent injury  Thought Process  Coherency WDL  Content Preoccupation  Delusions None reported or observed  Perception WDL  Hallucination None reported or observed  Judgment Limited  Confusion None  Danger to Self  Current suicidal ideation? Denies  Danger to Others  Danger to Others None reported or observed

## 2022-10-14 NOTE — ED Notes (Addendum)
Report called to [phone] Spoke to KIM, states will call back, concerned about pt wearing O2

## 2022-10-15 DIAGNOSIS — F322 Major depressive disorder, single episode, severe without psychotic features: Secondary | ICD-10-CM

## 2022-10-15 MED ORDER — LORAZEPAM 1 MG PO TABS: 1.0000 mg | ORAL_TABLET | ORAL | Status: DC | PRN

## 2022-10-15 MED ORDER — TRAZODONE HCL 100 MG PO TABS
100.0000 mg | ORAL_TABLET | Freq: Every evening | ORAL | Status: DC | PRN
  Administered 2022-10-15 – 2022-10-19 (×5): 100 mg via ORAL
  Filled 2022-10-15 (×5): qty 1

## 2022-10-15 MED ORDER — RISPERIDONE 1 MG PO TABS
0.5000 mg | ORAL_TABLET | ORAL | Status: DC
  Administered 2022-10-15 – 2022-10-20 (×10): 0.5 mg via ORAL
  Filled 2022-10-15 (×10): qty 1

## 2022-10-15 MED ORDER — DIAZEPAM 5 MG PO TABS
5.0000 mg | ORAL_TABLET | ORAL | Status: DC
  Administered 2022-10-15 – 2022-10-19 (×8): 5 mg via ORAL
  Filled 2022-10-15 (×8): qty 1

## 2022-10-15 MED ORDER — ALBUTEROL SULFATE HFA 108 (90 BASE) MCG/ACT IN AERS: 1.0000 | INHALATION_SPRAY | Freq: Four times a day (QID) | RESPIRATORY_TRACT | Status: DC | PRN
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
## 2022-10-15 NOTE — Progress Notes (Signed)
Suicide Risk Assessment  Admission Assessment    [facility] Admission Suicide Risk Assessment   Nursing information obtained from:  Patient Demographic factors:  Male, Caucasian, Divorced or widowed, Living alone Current Mental Status:  NA Loss Factors:  NA Historical Factors:  NA Risk Reduction Factors:  NA  Total Time spent with patient: 45 minutes Principal Problem: MDD (major depressive disorder) Diagnosis:  Principal Problem:   MDD (major depressive disorder)  Subjective Data: Patient seen and chart reviewed.  Charles [name], 62 year old male presented to the ED via EMS with alcohol intoxication, suicidal ideation and depressive symptoms. He was subsequently placed under IVC by the ED physician. His current assessment reflects improved judgment and mood.  Continued Clinical Symptoms:  Alcohol Use Disorder Identification Test Final Score (AUDIT): 22 The "Alcohol Use Disorders Identification Test", Guidelines for Use in Primary Care, Second Edition.  World Science writer [facility]). Score between 0-7:  no or low risk or alcohol related problems. Score between 8-15:  moderate risk of alcohol related problems. Score between 16-19:  high risk of alcohol related problems. Score 20 or above:  warrants further diagnostic evaluation for alcohol dependence and treatment.   CLINICAL FACTORS:   Depression:   Comorbid alcohol abuse/dependence Severe Alcohol/Substance Abuse/Dependencies Previous Psychiatric Diagnoses and Treatments Medical Diagnoses and Treatments/Surgeries   Musculoskeletal: Strength & Muscle Tone: decreased Gait & Station:  Ambulatory via wheelchair Patient leans: N/A  Psychiatric Specialty Exam:  Presentation  General Appearance:  Appropriate for Environment  Eye Contact: Good  Speech: Clear and Coherent  Speech Volume: Normal  Handedness: Right   Mood and Affect  Mood: Anxious (Euthymic)  Affect: Congruent   Thought Process  Thought  Processes: Coherent  Descriptions of Associations:Intact  Orientation:Full (Time, Place and Person)  Thought Content:WDL  History of Schizophrenia/Schizoaffective disorder:No  Duration of Psychotic Symptoms:No data recorded Hallucinations:Hallucinations: None  Ideas of Reference:None  Suicidal Thoughts:Suicidal Thoughts: No (Denies)  Homicidal Thoughts:Homicidal Thoughts: No   Sensorium  Memory: Immediate Good  Judgment: Fair  Insight: Fair   Executive Functions  Concentration: Good  Attention Span: Good  Recall: Fair  Fund of Knowledge: Fair  Language: Fair   Psychomotor Activity  Psychomotor Activity: Psychomotor Activity: Other (comment) (Left AKA.  Patient is ambulatory via wheelchair.)   Assets  Assets: Communication Skills; Desire for Improvement; Resilience; Social Support   Sleep  Sleep: Sleep: Fair    Physical Exam: Physical Exam Vitals and nursing note reviewed.  HENT:     Head: Normocephalic.     Nose: Nose normal.  Cardiovascular:     Rate and Rhythm: Normal rate.     Pulses: Normal pulses.  Pulmonary:     Effort: Pulmonary effort is normal. No respiratory distress.  Musculoskeletal:        General: Normal range of motion.     Cervical back: Normal range of motion.     Comments: Left AKA   Skin:    General: Skin is dry.  Neurological:     Mental Status: He is alert and oriented to person, place, and time.  Psychiatric:        Attention and Perception: Attention and perception normal. He does not perceive auditory or visual hallucinations.        Mood and Affect: Mood is anxious. Affect is blunt.        Speech: Speech normal.        Behavior: Behavior is cooperative.        Thought Content: Thought content normal. Thought content  is not paranoid or delusional. Thought content does not include homicidal or suicidal ideation.        Cognition and Memory: Cognition and memory normal.    Review of Systems   Respiratory:  Negative for shortness of breath.   Musculoskeletal:  Positive for back pain.  Psychiatric/Behavioral:  Positive for substance abuse. The patient is nervous/anxious.   All other systems reviewed and are negative.  Blood pressure (!) 138/91, pulse 89, temperature (!) 97.3 F (36.3 C), resp. rate 18, SpO2 95 %. There is no height or weight on file to calculate BMI.   COGNITIVE FEATURES THAT CONTRIBUTE TO RISK:  None    SUICIDE RISK:  Minimal: No identifiable suicidal ideation.  Patients presenting with no risk factors but with morbid ruminations; may be classified as minimal risk based on the severity of the depressive symptoms  PLAN OF CARE: Continue 15-minute checks. Review labs. Engage in individual and group therapy.  Continue psychotropic medication.  Ongoing assessment of dangerousness prior to discharge.  I certify that inpatient services furnished can reasonably be expected to improve the patient's condition.   Norma Fredrickson, NP 10/15/2022, 5:42 PM

## 2022-10-15 NOTE — Group Note (Signed)
Date:  10/15/2022 Time:  10:24 PM  Group Topic/Focus:  Crisis Planning:   The purpose of this group is to help patients create a crisis plan for use upon discharge or in the future, as needed.    Participation Level:  Active  Participation Quality:  Appropriate  Affect:  Appropriate  Cognitive:  Appropriate  Insight: Appropriate  Engagement in Group:  Engaged  Modes of Intervention:  Support  Additional Comments:    Garry Heater 10/15/2022, 10:24 PM

## 2022-10-15 NOTE — Group Note (Signed)
Recreation Therapy Group Note   Group Topic:Health and Wellness  Group Date: 10/15/2022 Start Time: 1400 End Time: 1455 Facilitators: Rosina Lowenstein, LRT, CTRS Location:  Day Room  Group Description: Seated Exercise. LRT discussed the mental and physical benefits of exercise. LRT and group discussed how physical activity can be used as a coping skill. Pt's and LRT followed along to an exercise video on the TV screen that provided a visual representation and audio description of every exercise performed. Pt's encouraged to listen to their bodies and stop at any time if they experience feelings of discomfort or pain. LRT passed out water after session was over and encouraged pts do drink and stay hydrated.  Goal Area(s) Addressed: Patient will learn benefits of physical activity. Patient will identify exercise as a coping skill.  Patient will follow multistep directions. Patient will try a new leisure interest.   Affect/Mood: Appropriate   Participation Level: Active and Engaged   Participation Quality: Independent   Behavior: Calm and Cooperative   Speech/Thought Process: Coherent   Insight: Good   Judgement: Good   Modes of Intervention: Activity   Patient Response to Interventions:  Attentive, Engaged, Interested , and Receptive   Education Outcome:  Acknowledges education   Clinical Observations/Individualized Feedback: Lorin Picket was active in their participation of session activities and group discussion. Pt completed almost all exercises appropriately while interacting well with LRT and peers duration of session.   Plan: Continue to engage patient in RT group sessions 2-3x/week.   Rosina Lowenstein, LRT, CTRS 10/15/2022 3:47 PM

## 2022-10-15 NOTE — Plan of Care (Signed)
  Problem: Nutrition: Goal: Adequate nutrition will be maintained Outcome: Progressing   Problem: Pain Managment: Goal: General experience of comfort will improve Outcome: Progressing   Problem: Safety: Goal: Ability to remain free from injury will improve Outcome: Progressing   

## 2022-10-15 NOTE — Progress Notes (Signed)
   10/15/22 0600  15 Minute Checks  Location Bedroom  Visual Appearance Calm  Behavior Composed  Sleep (Behavioral Health Patients Only)  Calculate sleep? (Click Yes once per 24 hr at 0600 safety check) Yes  Documented sleep last 24 hours 3.5

## 2022-10-15 NOTE — Group Note (Signed)
Date:  10/15/2022 Time:  10:25 AM  Group Topic/Focus:  Crisis Planning:   The purpose of this group is to help patients create a crisis plan for use upon discharge or in the future, as needed.    Participation Level:  Active  Participation Quality:  Appropriate  Affect:  Appropriate  Cognitive:  Alert and Appropriate  Insight: Appropriate  Engagement in Group:  Engaged  Modes of Intervention:  Activity and Discussion  Additional Comments:    Marta Antu 10/15/2022, 10:25 AM

## 2022-10-15 NOTE — H&P (Addendum)
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 tablet Take 2 tablets (2,000 Units total) by mouth daily. 60 tablet 0    DULoxetine (CYMBALTA) 60 MG capsule TAKE 1 CAPSULE BY MOUTH DAILY. 90 capsule 1    levothyroxine (SYNTHROID) 75 MCG tablet TAKE ONE TABLET BY MOUTH EVERY DAY WITH BREAKFAST 90 tablet 3    lisdexamfetamine (VYVANSE) 40 MG capsule TAKE 1 CAPSULE BY MOUTH EACH MORNING 30 capsule 0    metaxalone (SKELAXIN) 800 MG tablet Take 1,600 mg by mouth 3 (three) times daily. (Patient not taking: Reported  on 10/14/2022)      oxyCODONE (ROXICODONE) 15 MG immediate release tablet Take 1 tablet (15 mg total) by mouth every 6 (six) hours as needed  for pain. (Patient not taking: Reported on 10/14/2022) 20 tablet 0    senna (SENOKOT) 8.6 MG TABS tablet Take 1 tablet (8.6 mg total) by mouth daily as needed for mild constipation. 30 tablet 0    sildenafil (REVATIO) 20 MG tablet TAKE 1 TABLET BY MOUTH DAILY AS NEEDED FOR ERECTILE DYSFUNCTION 10 tablet 3    zinc sulfate 220 (50 Zn) MG capsule Take 1 capsule (220 mg total) by mouth daily. 30 capsule 0    zolpidem (AMBIEN) 10 MG tablet Take 1 tablet (10 mg total) by mouth at bedtime as needed. for sleep 30 tablet 2     Musculoskeletal: Strength & Muscle Tone: decreased Gait & Station:  Left AKA.  Patient is ambulatory via wheelchair. Patient leans: N/A            Psychiatric Specialty Exam:  Presentation  General Appearance:  Appropriate for Environment  Eye Contact: Good  Speech: Clear and Coherent  Speech Volume: Normal  Handedness: Right   Mood and Affect  Mood: Anxious (Euthymic)  Affect: Congruent   Thought Process  Thought Processes: Coherent  Duration of Psychotic Symptoms:N/A Past Diagnosis of Schizophrenia or Psychoactive disorder: No  Descriptions of Associations:Intact  Orientation:Full (Time, Place and Person)  Thought Content:WDL  Hallucinations:Hallucinations: None  Ideas of Reference:None  Suicidal Thoughts:Suicidal Thoughts: No (Denies)  Homicidal Thoughts:Homicidal Thoughts: No   Sensorium  Memory: Immediate Good  Judgment: Fair  Insight: Fair   Executive Functions  Concentration: Good  Attention Span: Good  Recall: Fair  Fund of Knowledge: Fair  Language: Fair   Psychomotor Activity  Psychomotor Activity: Psychomotor Activity: Other (comment) (Left AKA.  Patient is ambulatory via wheelchair.)   Assets  Assets: Communication Skills; Desire for Improvement;  Resilience; Social Support   Sleep  Sleep: Sleep: Fair    Physical Exam: Physical Exam Vitals and nursing note reviewed.  Constitutional:      Appearance: He is obese.  HENT:     Head: Normocephalic.     Nose: Nose normal.  Cardiovascular:     Pulses: Normal pulses.  Pulmonary:     Effort: Pulmonary effort is normal. No respiratory distress.  Musculoskeletal:        General: Normal range of motion.     Cervical back: Normal range of motion.     Comments: Left AKA  Skin:    General: Skin is dry.  Neurological:     Mental Status: He is alert and oriented to person, place, and time.  Psychiatric:        Attention and Perception: Attention normal. He does not perceive auditory or visual hallucinations.        Mood and Affect: Mood is anxious. Affect is blunt.        Speech: Speech normal.        Behavior: Behavior is cooperative.        Thought Content: Thought content normal. Thought content is not paranoid or delusional. Thought content does not include homicidal or suicidal ideation.        Cognition and Memory: Cognition and memory normal.    Review of Systems  Musculoskeletal:  Positive for back pain.  Psychiatric/Behavioral:  Positive for depression and substance abuse.   All other systems reviewed and are negative.  Blood pressure (!) 138/91, pulse 89, temperature (!) 97.3 F (36.3 C), resp. rate 18, SpO2 95 %. There is no height or weight on file to calculate BMI.  Treatment Plan Summary: Daily contact with patient to assess  and evaluate symptoms and progress in treatment, Medication management, and Plan : Continue bupropion 150 mg daily, diazepam 5 mg twice a day, duloxetine 60 mg daily, risperidone 0.5 mg twice a day, Vyvanse 40 mg daily , trazodone 100 mg at bedtime as needed for sleep support.  No medication changes at this time.  Observation Level/Precautions:  15 minute checks  Laboratory:   No new labs.  Psychotherapy:    Medications:    Consultations:     Discharge Concerns:    Estimated LOS:  Other:     Physician Treatment Plan for Primary Diagnosis: MDD (major depressive disorder) Long Term Goal(s): Improvement in symptoms so as ready for discharge  Short Term Goals: Ability to demonstrate self-control will improve, Ability to identify and develop effective coping behaviors will improve, and Ability to identify triggers associated with substance abuse/mental health issues will improve  Physician Treatment Plan for Secondary Diagnosis: Principal Problem:   MDD (major depressive disorder)  Long Term Goal(s): Improvement in symptoms so as ready for discharge  Short Term Goals: Ability to disclose and discuss suicidal ideas  I certify that inpatient services furnished can reasonably be expected to improve the patient's condition.    Norma Fredrickson, NP 6/27/20246:56 PM

## 2022-10-15 NOTE — Progress Notes (Signed)
   10/15/22 2200  Psych Admission Type (Psych Patients Only)  Admission Status Involuntary  Psychosocial Assessment  Patient Complaints Hopelessness;Other (Comment) (grief)  Eye Contact Fair  Facial Expression Sad  Affect Flat  Speech Soft  Interaction Assertive  Motor Activity Slow  Appearance/Hygiene In scrubs  Behavior Characteristics Cooperative  Mood Depressed  Thought Process  Coherency WDL  Content Preoccupation  Delusions None reported or observed  Perception WDL  Hallucination None reported or observed  Judgment Limited  Confusion None  Danger to Self  Current suicidal ideation? Denies  Danger to Others  Danger to Others None reported or observed

## 2022-10-15 NOTE — Progress Notes (Signed)
Patient is requesting to be discharged as soon as possible. He states that he has to make funeral arrangements for his wife that passed 4 days ago.

## 2022-10-15 NOTE — [facility] Suicide Risk Assessment (Signed)
[facility] INPATIENT:  Family/Significant Other Suicide Prevention Education  Suicide Prevention Education:  Education Completed; Marcha Dutton, friend, [phone] been identified by the patient as the family member/significant other with whom the patient will be residing, and identified as the person(s) who will aid the patient in the event of a mental health crisis (suicidal ideations/suicide attempt).  With written consent from the patient, the family member/significant other has been provided the following suicide prevention education, prior to the and/or following the discharge of the patient.  The suicide prevention education provided includes the following: Suicide risk factors Suicide prevention and interventions National Suicide Hotline telephone number [facility] assessment telephone number [facility] Emergency Assistance 911 [facility] and/or Residential Mobile Crisis Unit telephone number  Request made of family/significant other to: Remove weapons (e.g., guns, rifles, knives), all items previously/currently identified as safety concern.   Remove drugs/medications (over-the-counter, prescriptions, illicit drugs), all items previously/currently identified as a safety concern.  The family member/significant other verbalizes understanding of the suicide prevention education information provided.  The family member/significant other agrees to remove the items of safety concern listed above.  Niecey reports that "I don't know what brought him over there".  She reports that she assisted him to the couch and he was asleep when she left the home. She reports that "I knew he was drinking too much".  She reports that she has assists him with cooking and cleaning in addition to having food in the home.    She reports that the patient does not have any access to weapons.   She reports that she doesn't think that the patient is not a danger to self and others.    She  reports that "he was talking out of his head with losing her and was talking out f his head because of that liquor".  She reports "that's a good man".  She reports that patient is not a chronic drinker, that this was a result of the recent passing of his wife.   Harden Mo 10/15/2022, 12:45 PM

## 2022-10-15 NOTE — [facility] Counselor (Signed)
Patient declines aftercare referrals at this time, including both mental health and substance use treatment.   Penni Homans, MSW, LCSW 10/15/2022 12:44 PM

## 2022-10-15 NOTE — Progress Notes (Signed)
Patient is A+O x 4. He denies SI/HI/AVH. He denies anxiety and depression. Affect flat. Mood depressed. Appetite good. Adherent to taking medications.  Tylenol adm at 0914 for 10/10 gen pain. Upon follow up, pain decreased to a 4.  Trazodone 100 mg PRN at bedtime, diazepam 5 mg at 8a + 4p, Lorazepam 1 mg every 4h PRN, and risperidone 0.5 mg added to scheduled meds.  Discharge planning ongoing. Q15 minute unit checks in place.

## 2022-10-16 DIAGNOSIS — F332 Major depressive disorder, recurrent severe without psychotic features: Secondary | ICD-10-CM | POA: Diagnosis not present

## 2022-10-16 MED ORDER — GABAPENTIN 100 MG PO CAPS
200.0000 mg | ORAL_CAPSULE | Freq: Three times a day (TID) | ORAL | Status: DC
  Administered 2022-10-16 – 2022-10-20 (×12): 200 mg via ORAL
  Filled 2022-10-16 (×12): qty 2

## 2022-10-16 NOTE — Progress Notes (Signed)
   10/16/22 0721  Psych Admission Type (Psych Patients Only)  Admission Status Involuntary  Psychosocial Assessment  Patient Complaints Hopelessness;Sadness;Worthlessness  Biomedical scientist  Facial Expression Flat  Affect Sullen;Flat  Speech Soft  Interaction Assertive  Motor Activity Slow  Appearance/Hygiene In scrubs  Behavior Characteristics Cooperative  Mood Depressed  Thought Process  Coherency WDL  Content WDL  Delusions None reported or observed  Perception WDL  Hallucination None reported or observed  Judgment Impaired  Confusion None  Danger to Self  Current suicidal ideation? Denies  Danger to Others  Danger to Others None reported or observed

## 2022-10-16 NOTE — BH Assessment (Signed)
Recreation Therapy Notes  INPATIENT RECREATION THERAPY ASSESSMENT  Patient Details Name: Charles [name] MRN: [id number] DOB: [birth year] Today's Date: 10/16/2022       Information Obtained From:  Pt and Chart Review  Able to Participate in Assessment/Interview: Yes  Patient Presentation: Responsive, Alert, Oriented  Reason for Admission (Per Patient): Suicidal Ideation, Impulsive Behavior ("I was drunk and said something I shouldnt have")  Patient Stressors: Death, Relationship ("My wife dies 4 days ago. I found her dead in the floor and she was blue.")  Coping Skills:   Substance Abuse ("I drink")  Leisure Interests (2+):   ("I like to mow my yard and play with my dogs")  Frequency of Recreation/Participation: Weekly  Awareness of Community Resources:  Yes  Community Resources:  Public affairs consultant  Current Use: Yes  Expressed Interest in [employer] Information: No  [employer] of Residence:  Designer, jewellery"  Patient Main Form of Transportation: Uber/Lyft ("My friend will drive me or I will ge an uber")  Patient Strengths:  "Bossing people around"  Patient Identified Areas of Improvement:  "My attitude. I can be a smartass"  Patient Goal for Hospitalization:  "To get the hell out"  Current SI (including self-harm):  No  Current HI:  No  Current AVH: No  Staff Intervention Plan: Group Attendance, Collaborate with Interdisciplinary Treatment Team  Consent to Intern Participation: N/A   Pt shared that he needs to get out of the hospital to make funeral arrangements for his wife. Pt shared that he found his wife dead on the floor, he got drunk and made a suicidal statement. Pt shared that he doesn't think he belongs in the hospital and that "these other people are crazy". Pt asked LRT to talk with the doctor and ask if pt could leave. Pt shared that he has had time to get over the death of his wife in the few days he has been in the hospital. Pt shared  that he has 2 dogs at home that he would also like to get home to and that he hopes his daughter is taking care of them and that she is a Engineer, civil (consulting) in the OR here at [facility]. Pt shared that he is suppose to be getting his prosthetic leg on the 17th of July and is looking forward to that. Overall, Pt was bright and pleasant during assessment.    Pt respectfully declined LRT offering to put in a consul for [place].    [address] LRT, CTRS Citlalic Norlander E Sadira Standard 10/16/2022, 8:22 AM

## 2022-10-16 NOTE — [facility] Counselor (Signed)
CSW received message that patient's daughter would like for CSW to speak with her.   CSW checked with patient as permission had not been granted for her.   Patient declined to allow CSW to speak with daughter.  Penni Homans, MSW, LCSW 10/16/2022 11:15 AM
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
## 2022-10-16 NOTE — BH IP Treatment Plan (Signed)
Interdisciplinary Treatment and Diagnostic Plan Update  10/16/2022 Time of Session: 1:50 PM  Charles [name] MRN: [id number]  Principal Diagnosis: MDD (major depressive disorder)  Secondary Diagnoses: Principal Problem:   MDD (major depressive disorder)   Current Medications:  Current Facility-Administered Medications  Medication Dose Route Frequency Provider Last Rate Last Admin   acetaminophen (TYLENOL) tablet 650 mg  650 mg Oral Q6H PRN Lauree Chandler, NP   650 mg at 10/16/22 1006   albuterol (VENTOLIN HFA) 108 (90 Base) MCG/ACT inhaler 1 puff  1 puff Inhalation Q6H PRN Drusilla Kanner, RPH       alum & mag hydroxide-simeth (MAALOX/MYLANTA) 200-200-20 MG/5ML suspension 30 mL  30 mL Oral Q4H PRN Lauree Chandler, NP       amLODipine (NORVASC) tablet 5 mg  5 mg Oral Daily Lauree Chandler, NP   5 mg at 10/16/22 1005   atorvastatin (LIPITOR) tablet 40 mg  40 mg Oral Daily Lauree Chandler, NP   40 mg at 10/16/22 1005   buPROPion (WELLBUTRIN XL) 24 hr tablet 150 mg  150 mg Oral Daily Lauree Chandler, NP   150 mg at 10/16/22 1005   carvedilol (COREG) tablet 3.125 mg  3.125 mg Oral BID WC Lauree Chandler, NP   3.125 mg at 10/16/22 1005   diazepam (VALIUM) tablet 5 mg  5 mg Oral [id number] Sarina Ill, DO   5 mg at 10/16/22 1006   diphenhydrAMINE (BENADRYL) capsule 50 mg  50 mg Oral TID PRN Lauree Chandler, NP       Or   diphenhydrAMINE (BENADRYL) injection 50 mg  50 mg Intramuscular TID PRN Lauree Chandler, NP       DULoxetine (CYMBALTA) DR capsule 60 mg  60 mg Oral Daily Lauree Chandler, NP   60 mg at 10/16/22 1006   gabapentin (NEURONTIN) capsule 200 mg  200 mg Oral TID Sarina Ill, DO       haloperidol (HALDOL) tablet 5 mg  5 mg Oral TID PRN Lauree Chandler, NP       Or   haloperidol lactate (HALDOL) injection 5 mg  5 mg Intramuscular TID PRN Lauree Chandler, NP       levothyroxine (SYNTHROID) tablet 75 mcg  75 mcg Oral  Q0600 Lauree Chandler, NP   75 mcg at 10/16/22 0606   loperamide (IMODIUM) capsule 2-4 mg  2-4 mg Oral PRN Lauree Chandler, NP       LORazepam (ATIVAN) tablet 2 mg  2 mg Oral TID PRN Lauree Chandler, NP       Or   LORazepam (ATIVAN) injection 2 mg  2 mg Intramuscular TID PRN Lauree Chandler, NP       LORazepam (ATIVAN) tablet 1 mg  1 mg Oral Q6H PRN Lauree Chandler, NP       LORazepam (ATIVAN) tablet 1 mg  1 mg Oral Q4H PRN Sarina Ill, DO       magnesium hydroxide (MILK OF MAGNESIA) suspension 30 mL  30 mL Oral Daily PRN Lauree Chandler, NP       multivitamin with minerals tablet 1 tablet  1 tablet Oral Daily Lauree Chandler, NP   1 tablet at 10/16/22 1005   ondansetron (ZOFRAN-ODT) disintegrating tablet 4 mg  4 mg Oral Q6H PRN Lauree Chandler, NP       potassium chloride SA (KLOR-CON M) CR tablet 40 mEq  40 mEq Oral  BID Lauree Chandler, NP   40 mEq at 10/16/22 1005   risperiDONE (RISPERDAL) tablet 0.5 mg  0.5 mg Oral BH-[id number] Sarina Ill, DO   0.5 mg at 10/16/22 1006   senna (SENOKOT) tablet 8.6 mg  1 tablet Oral Daily PRN Lauree Chandler, NP       thiamine (VITAMIN B1) injection 100 mg  100 mg Intramuscular Once Lauree Chandler, NP       thiamine (VITAMIN B1) tablet 100 mg  100 mg Oral Daily Lauree Chandler, NP   100 mg at 10/16/22 1006   traZODone (DESYREL) tablet 100 mg  100 mg Oral QHS PRN Sarina Ill, DO   100 mg at 10/15/22 2146   PTA Medications: Medications Prior to Admission  Medication Sig Dispense Refill Last Dose   albuterol (PROVENTIL HFA;VENTOLIN HFA) 108 (90 Base) MCG/ACT inhaler Inhale 2 puffs into the lungs every 6 (six) hours as needed for wheezing or shortness of breath. 1 Inhaler 5    amLODipine (NORVASC) 5 MG tablet Take 1 tablet (5 mg total) by mouth daily. (Patient not taking: Reported on 10/14/2022) 90 tablet 1    ascorbic acid (VITAMIN C) 500 MG tablet Take 1 tablet (500 mg total) by  mouth daily. 30 tablet 0    atorvastatin (LIPITOR) 40 MG tablet TAKE ONE TABLET BY MOUTH AT BEDTIME FOR CHLOESTEROL 90 tablet 1    buPROPion (WELLBUTRIN XL) 150 MG 24 hr tablet TAKE 1 TABLET BY MOUTH DAILY 90 tablet 1    carvedilol (COREG) 3.125 MG tablet TAKE ONE (1) TABLET BY MOUTH TWO TIMES PER DAY WITH MEALS 60 tablet 5    cholecalciferol (VITAMIN D) 25 MCG tablet Take 2 tablets (2,000 Units total) by mouth daily. 60 tablet 0    DULoxetine (CYMBALTA) 60 MG capsule TAKE 1 CAPSULE BY MOUTH DAILY. 90 capsule 1    levothyroxine (SYNTHROID) 75 MCG tablet TAKE ONE TABLET BY MOUTH EVERY DAY WITH BREAKFAST 90 tablet 3    lisdexamfetamine (VYVANSE) 40 MG capsule TAKE 1 CAPSULE BY MOUTH EACH MORNING 30 capsule 0    metaxalone (SKELAXIN) 800 MG tablet Take 1,600 mg by mouth 3 (three) times daily. (Patient not taking: Reported on 10/14/2022)      oxyCODONE (ROXICODONE) 15 MG immediate release tablet Take 1 tablet (15 mg total) by mouth every 6 (six) hours as needed for pain. (Patient not taking: Reported on 10/14/2022) 20 tablet 0    senna (SENOKOT) 8.6 MG TABS tablet Take 1 tablet (8.6 mg total) by mouth daily as needed for mild constipation. 30 tablet 0    sildenafil (REVATIO) 20 MG tablet TAKE 1 TABLET BY MOUTH DAILY AS NEEDED FOR ERECTILE DYSFUNCTION 10 tablet 3    zinc sulfate 220 (50 Zn) MG capsule Take 1 capsule (220 mg total) by mouth daily. 30 capsule 0    zolpidem (AMBIEN) 10 MG tablet Take 1 tablet (10 mg total) by mouth at bedtime as needed. for sleep 30 tablet 2     Patient Stressors: Medication change or noncompliance   Substance abuse    Patient Strengths: Printmaker for treatment/growth   Treatment Modalities: Medication Management, Group therapy, Case management,  1 to 1 session with clinician, Psychoeducation, Recreational therapy.   Physician Treatment Plan for Primary Diagnosis: MDD (major depressive disorder) Long Term Goal(s): Improvement in symptoms so as  ready for discharge   Short Term Goals: Ability to disclose and discuss suicidal ideas Ability to demonstrate self-control  will improve Ability to identify and develop effective coping behaviors will improve Ability to identify triggers associated with substance abuse/mental health issues will improve  Medication Management: Evaluate patient's response, side effects, and tolerance of medication regimen.  Therapeutic Interventions: 1 to 1 sessions, Unit Group sessions and Medication administration.  Evaluation of Outcomes: Not Met  Physician Treatment Plan for Secondary Diagnosis: Principal Problem:   MDD (major depressive disorder)  Long Term Goal(s): Improvement in symptoms so as ready for discharge   Short Term Goals: Ability to disclose and discuss suicidal ideas Ability to demonstrate self-control will improve Ability to identify and develop effective coping behaviors will improve Ability to identify triggers associated with substance abuse/mental health issues will improve     Medication Management: Evaluate patient's response, side effects, and tolerance of medication regimen.  Therapeutic Interventions: 1 to 1 sessions, Unit Group sessions and Medication administration.  Evaluation of Outcomes: Not Met   RN Treatment Plan for Primary Diagnosis: MDD (major depressive disorder) Long Term Goal(s): Knowledge of disease and therapeutic regimen to maintain health will improve  Short Term Goals: Ability to remain free from injury will improve, Ability to verbalize frustration and anger appropriately will improve, Ability to demonstrate self-control, Ability to participate in decision making will improve, Ability to verbalize feelings will improve, Ability to disclose and discuss suicidal ideas, Ability to identify and develop effective coping behaviors will improve, and Compliance with prescribed medications will improve  Medication Management: RN will administer medications as  ordered by provider, will assess and evaluate patient's response and provide education to patient for prescribed medication. RN will report any adverse and/or side effects to prescribing provider.  Therapeutic Interventions: 1 on 1 counseling sessions, Psychoeducation, Medication administration, Evaluate responses to treatment, Monitor vital signs and CBGs as ordered, Perform/monitor CIWA, COWS, AIMS and Fall Risk screenings as ordered, Perform wound care treatments as ordered.  Evaluation of Outcomes: Not Met   LCSW Treatment Plan for Primary Diagnosis: MDD (major depressive disorder) Long Term Goal(s): Safe transition to appropriate next level of care at discharge, Engage patient in therapeutic group addressing interpersonal concerns.  Short Term Goals: Engage patient in aftercare planning with referrals and resources, Increase social support, Increase ability to appropriately verbalize feelings, Increase emotional regulation, Facilitate acceptance of mental health diagnosis and concerns, Facilitate patient progression through stages of change regarding substance use diagnoses and concerns, and Increase skills for wellness and recovery  Therapeutic Interventions: Assess for all discharge needs, 1 to 1 time with Social worker, Explore available resources and support systems, Assess for adequacy in community support network, Educate family and significant other(s) on suicide prevention, Complete Psychosocial Assessment, Interpersonal group therapy.  Evaluation of Outcomes: Not Met   Progress in Treatment: Attending groups: Yes. Participating in groups: Yes. Taking medication as prescribed: Yes. Toleration medication: Yes. Family/Significant other contact made: Yes, individual(s) contacted:  Silva Bandy, friend  Patient understands diagnosis: Yes. Discussing patient identified problems/goals with staff: Yes. Medical problems stabilized or resolved: Yes. Denies suicidal/homicidal ideation:  Yes. Issues/concerns per patient self-inventory: No. Other: None   New problem(s) identified: No, Describe:  None identified   New Short Term/Long Term Goal(s):" I want to get better and want to get back to normal life without my wife"  Patient Goals:  delimination of symptoms of psychosis, medication management for mood stabilization; elimination of SI thoughts; development of comprehensive mental wellness/sobriety plan.   Discharge Plan or Barriers: CSW will assist with appropriate treatment planning   Reason for Continuation of Hospitalization:  Depression Suicidal ideation  Estimated Length of Stay: 1 to 7 days  Last 3 [place] Suicide Severity Risk Score: Flowsheet Row Admission (Current) from 10/14/2022 in [facility] ED to Hosp-Admission (Discharged) from 09/08/2022 in [facility] to Hosp-Admission (Discharged) from 05/04/2022 in [facility] (1A)  C-SSRS RISK CATEGORY No Risk No Risk No Risk       Last PHQ 2/9 Scores:    01/20/2022   10:49 AM 01/20/2022   10:47 AM 01/06/2022   11:04 AM  Depression screen PHQ 2/9  Decreased Interest 0 0 0  Down, Depressed, Hopeless 0 0 1  PHQ - 2 Score 0 0 1    Scribe for Treatment Team: Laretta Alstrom 10/16/2022 3:39 PM

## 2022-10-16 NOTE — Plan of Care (Signed)
  Problem: Education: Goal: Knowledge of General Education information will improve Description: Including pain rating scale, medication(s)/side effects and non-pharmacologic comfort measures Outcome: Progressing   Problem: Health Behavior/Discharge Planning: Goal: Ability to manage health-related needs will improve Outcome: Progressing   Problem: Clinical Measurements: Goal: Ability to maintain clinical measurements within normal limits will improve Outcome: Progressing Goal: Will remain free from infection Outcome: Progressing Goal: Diagnostic test results will improve Outcome: Progressing Goal: Respiratory complications will improve Outcome: Progressing Goal: Cardiovascular complication will be avoided Outcome: Progressing   Problem: Activity: Goal: Risk for activity intolerance will decrease Outcome: Progressing   Problem: Elimination: Goal: Will not experience complications related to bowel motility Outcome: Progressing Goal: Will not experience complications related to urinary retention Outcome: Progressing   Problem: Pain Managment: Goal: General experience of comfort will improve Outcome: Progressing   

## 2022-10-16 NOTE — Plan of Care (Signed)
New Goal as of 10/15/22  Problem: Coping Skills Goal: STG - Patient will identify 3 positive coping skills strategies to use post d/c within 5 recreation therapy group sessions Description: STG - Patient will identify 3 positive coping skills strategies to use post d/c within 5 recreation therapy group sessions Outcome: Not Applicable

## 2022-10-16 NOTE — Group Note (Signed)
Recreation Therapy Group Note   Group Topic:Leisure Education  Group Date: 10/16/2022 Start Time: 1400 End Time: 1455 Facilitators: Rosina Lowenstein, LRT, CTRS Location:  Dayroom  Group Description: Leisure. Patients were given the option to choose from coloring, singing karaoke, or playing cards. LRT and pts discussed the meaning of leisure, the importance of participating in leisure during their free time/when they're outside of the hospital, as well as how our leisure interests can also serve as coping skills. Pt identified two leisure interests and shared with the group.   Goal Area(s) Addressed:  Patient will identify a current leisure interest.  Patient will learn the definition of "leisure". Patient will practice making a positive decision. Patient will have the opportunity to try a new leisure activity. Patient will communicate with peers and LRT.    Affect/Mood: N/A   Participation Level: Did not attend    Clinical Observations/Individualized Feedback: Charles [name] did not attend group.  Plan: Continue to engage patient in RT group sessions 2-3x/week.   Rosina Lowenstein, LRT, CTRS 10/16/2022 3:53 PM

## 2022-10-16 NOTE — BHH Counselor (Signed)
CSW spoke with the patient's daughter.  Patient's daughter has requested via nursing that CSW give a call.  CSW did not have initial consent from patient to speak with daughter.   CSW obtained verbal permission during treatment team.  CSW contacted daughter, [place] Moore, daughter, [phone]. Daughter reports that she thinks that the patient needs inpatient treatment to address his alcohol consumption. She reports that patient is an alcoholic and unable to care for himself in his home.   CSW pointed out that she can not discuss substance use due to policy and encouraged daughter to speak with pt on her concerns and recommendations for treatment.    CSW informed that at present plan is for patient to return to the home and and my further discussion on plans are needed to be discussed between patient and daughter.  Daughter expressed frustration that "what's the point of him being on geropsychiatry?  He needs treatment.  He's an alcoholic.  What do you do to help anyone?"  CSW provided education on CSW role at hospital to discharge plan, assess for social determinants and link to resources.  Daughter was clearly frustrated, as evidenced by self-report and tone.   CSW explained that patient has the right to identify his course of treatment and CSW can provide recommendations but patient is not required to follow them, CSW can not force patient's into any type of care.   Daughter again expressed frustration and stated that "if anything should happen then that will be on you all right?"  CSW provided empathy but did not respond to statement.   Daughter did make comment about APS involvement but did not clarify as to why.   Conversation was terminated without incident.    CSW attempted to ensure that daughter had CSW contact information, however daughter indicated that she does not plan on calling back. CSW explained that she can call back when needed and CSW will call back if voicemail is  left.  Penni Homans, MSW, LCSW 10/16/2022 4:35 PM

## 2022-10-16 NOTE — Progress Notes (Signed)
   10/16/22 0600  15 Minute Checks  Location Dayroom  Visual Appearance Calm  Behavior Composed  Sleep (Behavioral Health Patients Only)  Calculate sleep? (Click Yes once per 24 hr at 0600 safety check) Yes  Documented sleep last 24 hours 5

## 2022-10-16 NOTE — Group Note (Signed)
Date:  10/16/2022 Time:  11:16 AM  Group Topic/Focus:  Goals Group:   The focus of this group is to help patients establish daily goals to achieve during treatment and discuss how the patient can incorporate goal setting into their daily lives to aide in recovery.    Participation Level:  Did Not Attend    Rodena Goldmann 10/16/2022, 11:16 AM

## 2022-10-17 DIAGNOSIS — F332 Major depressive disorder, recurrent severe without psychotic features: Secondary | ICD-10-CM | POA: Diagnosis not present

## 2022-10-17 NOTE — Progress Notes (Signed)
   10/17/22 0900  Psych Admission Type (Psych Patients Only)  Admission Status Involuntary  Psychosocial Assessment  Patient Complaints Hopelessness  Eye Contact Fair  Facial Expression Flat  Affect Sullen  Speech Logical/coherent  Interaction Assertive  Motor Activity Other (Comment) (w/c - [facility])  Appearance/Hygiene In scrubs  Behavior Characteristics Appropriate to situation  Mood Sad  Thought Process  Coherency WDL  Content WDL  Delusions None reported or observed  Perception WDL  Hallucination None reported or observed  Judgment Limited  Confusion None  Danger to Self  Current suicidal ideation? Denies  Danger to Others  Danger to Others None reported or observed

## 2022-10-17 NOTE — Group Note (Signed)
Date:  10/17/2022 Time:  12:43 AM  Group Topic/Focus:  Building Self Esteem:   The Focus of this group is helping patients become aware of the effects of self-esteem on their lives, the things they and others do that enhance or undermine their self-esteem, seeing the relationship between their level of self-esteem and the choices they make and learning ways to enhance self-esteem. Coping With Mental Health Crisis:   The purpose of this group is to help patients identify strategies for coping with mental health crisis.  Group discusses possible causes of crisis and ways to manage them effectively. Developing a Wellness Toolbox:   The focus of this group is to help patients develop a "wellness toolbox" with skills and strategies to promote recovery upon discharge. Goals Group:   The focus of this group is to help patients establish daily goals to achieve during treatment and discuss how the patient can incorporate goal setting into their daily lives to aide in recovery. Healthy Communication:   The focus of this group is to discuss communication, barriers to communication, as well as healthy ways to communicate with others. Identifying Needs:   The focus of this group is to help patients identify their personal needs that have been historically problematic and identify healthy behaviors to address their needs. Making Healthy Choices:   The focus of this group is to help patients identify negative/unhealthy choices they were using prior to admission and identify positive/healthier coping strategies to replace them upon discharge. Managing Feelings:   The focus of this group is to identify what feelings patients have difficulty handling and develop a plan to handle them in a healthier way upon discharge. Rediscovering Joy:   The focus of this group is to explore various ways to relieve stress in a positive manner.    Participation Level:  Minimal  Participation Quality:  Attentive  Affect:   Appropriate  Cognitive:  Alert  Insight: Good  Engagement in Group:  Engaged  Modes of Intervention:  Discussion and Support  Additional Comments:    Charles [name] 10/17/2022, 12:43 AM

## 2022-10-17 NOTE — Plan of Care (Signed)
?  Problem: Education: ?Goal: Knowledge of General Education information will improve ?Description: Including pain rating scale, medication(s)/side effects and non-pharmacologic comfort measures ?Outcome: Progressing ?  ?Problem: Health Behavior/Discharge Planning: ?Goal: Ability to manage health-related needs will improve ?Outcome: Progressing ?  ?Problem: Coping: ?Goal: Level of anxiety will decrease ?Outcome: Progressing ?  ?

## 2022-10-17 NOTE — Plan of Care (Signed)
Calm and cooperative. Isolates in room except for meds and meals. States he's ready to go home and arrange funeral arrangements for his wife. Pt said "I'm feeling better and I need to go home." Support and encouragement provided. Po med compliant. Trazodone 100mg  po given as ordered PRN for insomnia. Q 15 min checks provided. Denies SI/HI/AV/H. No c/o pain/discomfort noted.   Problem: Activity: Goal: Risk for activity intolerance will decrease Outcome: Progressing   Problem: Nutrition: Goal: Adequate nutrition will be maintained Outcome: Progressing   Problem: Coping: Goal: Level of anxiety will decrease Outcome: Progressing   Problem: Elimination: Goal: Will not experience complications related to bowel motility Outcome: Progressing Goal: Will not experience complications related to urinary retention Outcome: Progressing   Problem: Pain Managment: Goal: General experience of comfort will improve Outcome: Progressing
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
## 2022-10-18 DIAGNOSIS — F332 Major depressive disorder, recurrent severe without psychotic features: Secondary | ICD-10-CM | POA: Diagnosis not present

## 2022-10-18 MED ORDER — POLYETHYLENE GLYCOL 3350 17 G PO PACK
17.0000 g | PACK | Freq: Every day | ORAL | Status: DC
  Administered 2022-10-18 – 2022-10-20 (×3): 17 g via ORAL
  Filled 2022-10-18 (×3): qty 1
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
## 2022-10-18 NOTE — Group Note (Signed)
LCSW Group Therapy Note   Group Date: 10/18/2022 Start Time: 1310 End Time: 1357   Type of Therapy and Topic:  Group Therapy: 7 ways to Reduce Stress  Participation Level:  Active      Summary of Patient Progress:  The patient attended group. The patient shared that his wife passed and that not being home during this time is something that causing him stress. The patient stated that his dogs are his stress relievers. The patient stated that he wish that he was closer to his siblings.     Marshell Levan, LCSWA 10/18/2022  2:14 PM

## 2022-10-18 NOTE — Progress Notes (Signed)
Patient is alert and oriented. He denies SI/HI/AVH. He denies anxiety and depression although his mood is sad and affect sullen. Appetite good. Medications given whole without difficulty. No complaints of pain.  Patient requested Miralax. Order written and adm at 1107. Patient returned to nurses station at 1500. "When I take Miralax, I usually have a bowel movement within 30 minutes. May I please have something else?" Milk of Magnesia adm PRN at 1501. CIWA order dc'd as it has been more than 24 hours where patient has scored a zero.  Patient insists on being dc'd ASAP in order to make funeral arrangements for his late wife.  Q15 minute unit checks in place.

## 2022-10-18 NOTE — Plan of Care (Signed)
Calm and cooperative. Flat affect. Visible on the unit. Po med compliant. No behavior issues noted. Denies SI/HI/AV/H. Q 15 min checks maintained for safety. No c/o  pain/discomfort noted.   Problem: Activity: Goal: Risk for activity intolerance will decrease Outcome: Progressing   Problem: Nutrition: Goal: Adequate nutrition will be maintained Outcome: Progressing   Problem: Coping: Goal: Level of anxiety will decrease Outcome: Progressing   Problem: Elimination: Goal: Will not experience complications related to bowel motility Outcome: Progressing Goal: Will not experience complications related to urinary retention Outcome: Progressing   Problem: Pain Managment: Goal: General experience of comfort will improve Outcome: Progressing

## 2022-10-18 NOTE — Plan of Care (Signed)

## 2022-10-19 DIAGNOSIS — F332 Major depressive disorder, recurrent severe without psychotic features: Secondary | ICD-10-CM | POA: Diagnosis not present

## 2022-10-19 MED ORDER — DIAZEPAM 2 MG PO TABS
2.0000 mg | ORAL_TABLET | ORAL | Status: DC
  Administered 2022-10-19 – 2022-10-20 (×2): 2 mg via ORAL
  Filled 2022-10-19 (×2): qty 1

## 2022-10-19 NOTE — Plan of Care (Signed)

## 2022-10-19 NOTE — Group Note (Signed)
Recreation Therapy Group Note   Group Topic:General Recreation  Group Date: 10/19/2022 Start Time: 1410 End Time: 1450 Facilitators: Rosina Lowenstein, LRT, CTRS Location: Courtyard  Group Description: Outdoor Recreation. Patients had the option to play ring toss, play with a deck of cards or sit and listen to music while outside in the courtyard getting fresh air and sunlight. LRT and pts discussed things that they enjoy doing in their free time outside of the hospital.  Goal Area(s) Addressed: Patient will identify leisure interests.  Patient will practice healthy decision making. Patient will engage in recreation activity.   Affect/Mood: Appropriate   Participation Level: Active and Engaged   Participation Quality: Independent   Behavior: Calm and Cooperative   Speech/Thought Process: Coherent   Insight: Good   Judgement: Good   Modes of Intervention: Activity   Patient Response to Interventions:  Attentive, Engaged, Interested , and Receptive   Education Outcome:  Acknowledges education   Clinical Observations/Individualized Feedback: Lorin Picket was active in their participation of session activities and group discussion. Pt played ring toss with LRT and peers while outside. Pt interacted well with LRT and peers duration of session.   Plan: Continue to engage patient in RT group sessions 2-3x/week.   Rosina Lowenstein, LRT, CTRS 10/19/2022 3:34 PM

## 2022-10-19 NOTE — Progress Notes (Signed)
   10/19/22 2000  Psych Admission Type (Psych Patients Only)  Admission Status Involuntary  Psychosocial Assessment  Patient Complaints None  Eye Contact Fair  Facial Expression Flat  Affect Sullen  Speech Logical/coherent  Interaction Assertive  Motor Activity Slow;Other (Comment) (wheelchair)  Appearance/Hygiene In scrubs  Behavior Characteristics Cooperative  Mood Sad  Thought Process  Coherency WDL  Content WDL  Delusions None reported or observed  Perception WDL  Hallucination None reported or observed  Judgment Limited  Confusion None  Danger to Self  Current suicidal ideation? Denies  Danger to Others  Danger to Others None reported or observed

## 2022-10-19 NOTE — Group Note (Signed)
Date:  10/19/2022 Time:  3:24 AM  Group Topic/Focus:  Emotional Education:   The focus of this group is to discuss what feelings/emotions are, and how they are experienced.  The focus of this group is to learn each patients love language which leads to building healthier relationships with their support systems.  Participation Level:  Active  Participation Quality:  Sharing  Affect:  Appropriate  Cognitive:  Appropriate  Insight: Appropriate  Engagement in Group:  Engaged  Modes of Intervention:  Activity and Education  Additional Comments:   Dallie Piles Deneene Tarver 10/19/2022, 3:24 AM

## 2022-10-19 NOTE — Group Note (Signed)
[facility] LCSW Group Therapy Note    Group Date: 10/19/2022 Start Time: 1300 End Time: 1350  Type of Therapy and Topic:  Group Therapy:  Overcoming Obstacles  Participation Level:  [facility] PARTICIPATION LEVEL: None  Mood:  Description of Group:   In this group patients will be encouraged to explore what they see as obstacles to their own wellness and recovery. They will be guided to discuss their thoughts, feelings, and behaviors related to these obstacles. The group will process together ways to cope with barriers, with attention given to specific choices patients can make. Each patient will be challenged to identify changes they are motivated to make in order to overcome their obstacles. This group will be process-oriented, with patients participating in exploration of their own experiences as well as giving and receiving support and challenge from other group members.  Therapeutic Goals: 1. Patient will identify personal and current obstacles as they relate to admission. 2. Patient will identify barriers that currently interfere with their wellness or overcoming obstacles.  3. Patient will identify feelings, thought process and behaviors related to these barriers. 4. Patient will identify two changes they are willing to make to overcome these obstacles:    Summary of Patient Progress Patient was present in group.  Patient shared that his obstacle is being hospitalized.  He reports that he would like to be able to return home to plan his wife's funeral.  He did not engage in discussion further.     Therapeutic Modalities:   Cognitive Behavioral Therapy Solution Focused Therapy Motivational Interviewing Relapse Prevention Therapy   Harden Mo, LCSW

## 2022-10-19 NOTE — Plan of Care (Signed)
Calm and cooperative. States he's feeling much better. Po med compliant. Attends group. Interacting with peers and staff. No behavior issues noted. Denies SI/HI/AV/H. Q 15 min checks maintained for safety. No c/o pain/discomfort noted.   Problem: Activity: Goal: Risk for activity intolerance will decrease Outcome: Progressing   Problem: Nutrition: Goal: Adequate nutrition will be maintained Outcome: Progressing   Problem: Coping: Goal: Level of anxiety will decrease Outcome: Progressing   Problem: Elimination: Goal: Will not experience complications related to bowel motility Outcome: Progressing Goal: Will not experience complications related to urinary retention Outcome: Progressing   Problem: Pain Managment: Goal: General experience of comfort will improve Outcome: Progressing
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
## 2022-10-20 DIAGNOSIS — F332 Major depressive disorder, recurrent severe without psychotic features: Secondary | ICD-10-CM | POA: Diagnosis not present

## 2022-10-20 MED ORDER — RISPERIDONE 0.5 MG PO TABS: 0.5000 mg | ORAL_TABLET | ORAL | 3 refills | Status: DC

## 2022-10-20 MED ORDER — LEVOTHYROXINE SODIUM 75 MCG PO TABS: 75.0000 ug | ORAL_TABLET | Freq: Every day | ORAL | 3 refills | Status: DC

## 2022-10-20 MED ORDER — TRAZODONE HCL 100 MG PO TABS: 100.0000 mg | ORAL_TABLET | Freq: Every evening | ORAL | 3 refills | Status: DC | PRN

## 2022-10-20 MED ORDER — GABAPENTIN 100 MG PO CAPS: 200.0000 mg | ORAL_CAPSULE | Freq: Three times a day (TID) | ORAL | 3 refills | Status: DC

## 2022-10-20 MED ORDER — BUPROPION HCL ER (XL) 150 MG PO TB24
150.0000 mg | ORAL_TABLET | Freq: Every day | ORAL | 3 refills | Status: DC
Start: 2022-10-20 — End: 2023-03-04

## 2022-10-20 MED ORDER — DULOXETINE HCL 60 MG PO CPEP: 60.0000 mg | ORAL_CAPSULE | Freq: Every day | ORAL | 3 refills | Status: DC

## 2022-10-20 MED ORDER — CARVEDILOL 3.125 MG PO TABS: 3.1250 mg | ORAL_TABLET | Freq: Two times a day (BID) | ORAL | 3 refills | Status: DC

## 2022-10-20 MED ORDER — AMLODIPINE BESYLATE 5 MG PO TABS
5.0000 mg | ORAL_TABLET | Freq: Every day | ORAL | 3 refills | Status: DC
Start: 2022-10-20 — End: 2023-11-15
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
## 2022-10-20 NOTE — [facility] Suicide Risk Assessment (Signed)
[facility] Discharge Suicide Risk Assessment   Principal Problem: MDD (major depressive disorder) Discharge Diagnoses: Principal Problem:   MDD (major depressive disorder)   Total Time spent with patient: 1 hour  Musculoskeletal: Strength & Muscle Tone: within normal limits Gait & Station: unable to stand Patient leans: N/A  Psychiatric Specialty Exam  Presentation  General Appearance:  Appropriate for Environment  Eye Contact: Good  Speech: Clear and Coherent  Speech Volume: Normal  Handedness: Right   Mood and Affect  Mood: Anxious (Euthymic)  Duration of Depression Symptoms: Less than two weeks  Affect: Congruent   Thought Process  Thought Processes: Coherent  Descriptions of Associations:Intact  Orientation:Full (Time, Place and Person)  Thought Content:WDL  History of Schizophrenia/Schizoaffective disorder:No  Duration of Psychotic Symptoms:No data recorded Hallucinations:No data recorded Ideas of Reference:None  Suicidal Thoughts:No data recorded Homicidal Thoughts:No data recorded  Sensorium  Memory: Immediate Good  Judgment: Fair  Insight: Fair   Art therapist  Concentration: Good  Attention Span: Good  Recall: Fair  Fund of Knowledge: Fair  Language: Fair   Psychomotor Activity  Psychomotor Activity:No data recorded  Assets  Assets: Communication Skills; Desire for Improvement; Resilience; Social Support   Sleep  Sleep:No data recorded  Blood pressure 131/83, pulse 87, temperature 98.3 F (36.8 C), temperature source Oral, resp. rate 18, SpO2 98 %. There is no height or weight on file to calculate BMI.  Mental Status Per Nursing Assessment::   On Admission:  NA  Demographic Factors:  Male and Caucasian  Loss Factors: NA  Historical Factors: NA  Risk Reduction Factors:   NA  Continued Clinical Symptoms:  Depression:   Anhedonia  Cognitive Features That Contribute To Risk:  None     Suicide Risk:  Minimal: No identifiable suicidal ideation.  Patients presenting with no risk factors but with morbid ruminations; may be classified as minimal risk based on the severity of the depressive symptoms   Follow-up Information     [facility] Follow up.   Why: Appointment is scheduled for 10/23/2022 at 10:00AM.  Please bring copy of prescriptions and dishcarge summary to appointment. Contact information: [address] [place] [address] [phone]                 Plan Of Care/Follow-up recommendations: [facility]   Sarina Ill, DO 10/20/2022, 10:18 AM

## 2022-10-20 NOTE — Plan of Care (Signed)

## 2022-10-20 NOTE — Progress Notes (Signed)
Patient ID: Charles [name], male   DOB: October 29, 1960, 62 y.o.   MRN: [id number]   Patient was discharged from [facility] unit at approx 1240 escorted by staff. Patient denies SI/HI/AVH. Discharge packet to include printed AVS, Suicide Risk Assessment, and Transition Record reviewed with patient. Belongings to include eyeglasses (on person), wallet, and cellphone returned and patient verified receipt with signature. Suicide safety plan completed with a copy kept in chart. Patient was also given a return to work letter.

## 2022-10-20 NOTE — Progress Notes (Signed)
   10/20/22 0601  15 Minute Checks  Location Bedroom  Visual Appearance Calm  Behavior Sleeping  Sleep (Behavioral Health Patients Only)  Calculate sleep? (Click Yes once per 24 hr at 0600 safety check) Yes  Documented sleep last 24 hours 6.75

## 2022-10-20 NOTE — Progress Notes (Signed)
  [facility] Adult Case Management Discharge Plan :  Will you be returning to the same living situation after discharge:  Yes,  pt reports that he is returning home. At discharge, do you have transportation home?: Yes,  pt reports that his friend will provide transportation. Do you have the ability to pay for your medications: Yes,  [place] MEDICAID PREPAID HEALTH PLAN / [place] MEDICAID [facility]  Release of information consent forms completed and in the chart;  Patient's signature needed at discharge.  Patient to Follow up at:  Follow-up Information     [facility] Follow up.   Why: Appointment is scheduled for 10/23/2022 at 10:00AM.  Please bring copy of prescriptions and dishcarge summary to appointment. Contact information: [address] [place] [address] [phone]                 Next level of care provider has access to [facility] Link:no  Safety Planning and Suicide Prevention discussed: Yes,  SPE completed with the patient and patient 's identified collateral.      Has patient been referred to the Quitline?: Patient does not use tobacco/nicotine products  Patient has been referred for addiction treatment: Yes, the patient will follow up with an outpatient provider for substance use disorder. Therapist: appointment made 10/23/2022 at 10AM.  Harden Mo, LCSW 10/20/2022, 9:39 AM

## 2022-10-26 ENCOUNTER — Other Ambulatory Visit (INDEPENDENT_AMBULATORY_CARE_PROVIDER_SITE_OTHER): Payer: Self-pay | Admitting: Nurse Practitioner

## 2022-10-26 ENCOUNTER — Other Ambulatory Visit: Payer: Self-pay | Admitting: Physician Assistant

## 2022-10-26 DIAGNOSIS — G4719 Other hypersomnia: Secondary | ICD-10-CM

## 2022-10-26 DIAGNOSIS — Z89612 Acquired absence of left leg above knee: Secondary | ICD-10-CM

## 2022-10-26 NOTE — Telephone Encounter (Signed)
Please send med..

## 2022-10-29 ENCOUNTER — Ambulatory Visit: Payer: Medicaid Other | Admitting: Physician Assistant

## 2022-10-29 ENCOUNTER — Encounter: Payer: Self-pay | Admitting: Physician Assistant

## 2022-10-29 VITALS — BP 130/70 | HR 85 | Temp 97.8°F | Resp 16 | Ht 73.0 in | Wt 265.0 lb

## 2022-10-29 DIAGNOSIS — I10 Essential (primary) hypertension: Secondary | ICD-10-CM | POA: Diagnosis not present

## 2022-10-29 DIAGNOSIS — F329 Major depressive disorder, single episode, unspecified: Secondary | ICD-10-CM

## 2022-10-29 DIAGNOSIS — F5101 Primary insomnia: Secondary | ICD-10-CM

## 2022-10-29 DIAGNOSIS — G4719 Other hypersomnia: Secondary | ICD-10-CM

## 2022-10-29 DIAGNOSIS — F4321 Adjustment disorder with depressed mood: Secondary | ICD-10-CM | POA: Diagnosis not present

## 2022-10-29 MED ORDER — SILDENAFIL CITRATE 20 MG PO TABS: ORAL_TABLET | ORAL | 1 refills | Status: AC

## 2022-10-29 MED ORDER — FUROSEMIDE 20 MG PO TABS: 20.0000 mg | ORAL_TABLET | Freq: Every day | ORAL | 3 refills | Status: DC | PRN
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
## 2022-11-03 ENCOUNTER — Ambulatory Visit (INDEPENDENT_AMBULATORY_CARE_PROVIDER_SITE_OTHER): Payer: Medicaid Other

## 2022-11-03 ENCOUNTER — Ambulatory Visit (INDEPENDENT_AMBULATORY_CARE_PROVIDER_SITE_OTHER): Payer: Medicaid Other | Admitting: Nurse Practitioner

## 2022-11-03 ENCOUNTER — Encounter (INDEPENDENT_AMBULATORY_CARE_PROVIDER_SITE_OTHER): Payer: Self-pay | Admitting: Nurse Practitioner

## 2022-11-03 VITALS — BP 104/63 | HR 88 | Resp 18

## 2022-11-03 DIAGNOSIS — Z89612 Acquired absence of left leg above knee: Secondary | ICD-10-CM

## 2022-11-03 DIAGNOSIS — I10 Essential (primary) hypertension: Secondary | ICD-10-CM | POA: Diagnosis not present

## 2022-11-03 DIAGNOSIS — I739 Peripheral vascular disease, unspecified: Secondary | ICD-10-CM

## 2022-11-03 DIAGNOSIS — M7989 Other specified soft tissue disorders: Secondary | ICD-10-CM

## 2022-11-03 DIAGNOSIS — E785 Hyperlipidemia, unspecified: Secondary | ICD-10-CM
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
## 2022-11-11 LAB — VAS US ABI WITH/WO TBI: Right ABI: 1.1

## 2022-11-24 ENCOUNTER — Other Ambulatory Visit: Payer: Self-pay | Admitting: Physician Assistant

## 2022-11-24 DIAGNOSIS — G4719 Other hypersomnia: Secondary | ICD-10-CM

## 2022-11-24 DIAGNOSIS — F5101 Primary insomnia: Secondary | ICD-10-CM

## 2022-11-24 NOTE — Telephone Encounter (Signed)
Please send med..

## 2022-11-26 ENCOUNTER — Other Ambulatory Visit (INDEPENDENT_AMBULATORY_CARE_PROVIDER_SITE_OTHER): Payer: Self-pay | Admitting: Nurse Practitioner

## 2022-11-26 ENCOUNTER — Telehealth (INDEPENDENT_AMBULATORY_CARE_PROVIDER_SITE_OTHER): Payer: Self-pay

## 2022-11-26 DIAGNOSIS — Z89612 Acquired absence of left leg above knee: Secondary | ICD-10-CM

## 2022-11-26 NOTE — Telephone Encounter (Signed)
Referral sent 

## 2022-11-26 NOTE — Telephone Encounter (Signed)
Pt states understanding that referral has been sent

## 2022-11-27 ENCOUNTER — Other Ambulatory Visit: Payer: Self-pay | Admitting: Physician Assistant

## 2022-11-27 DIAGNOSIS — G4719 Other hypersomnia: Secondary | ICD-10-CM

## 2022-11-30 ENCOUNTER — Ambulatory Visit: Payer: Medicaid Other | Admitting: Physical Therapy

## 2022-12-01 ENCOUNTER — Ambulatory Visit: Payer: Medicaid Other | Attending: Nurse Practitioner

## 2022-12-01 DIAGNOSIS — R262 Difficulty in walking, not elsewhere classified: Secondary | ICD-10-CM | POA: Insufficient documentation

## 2022-12-01 DIAGNOSIS — Z89612 Acquired absence of left leg above knee: Secondary | ICD-10-CM | POA: Diagnosis not present

## 2022-12-01 DIAGNOSIS — R2681 Unsteadiness on feet: Secondary | ICD-10-CM | POA: Diagnosis present

## 2022-12-01 DIAGNOSIS — S78119A Complete traumatic amputation at level between unspecified hip and knee, initial encounter: Secondary | ICD-10-CM | POA: Insufficient documentation
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
## 2022-12-08 ENCOUNTER — Ambulatory Visit: Payer: Medicaid Other

## 2022-12-08 DIAGNOSIS — R2681 Unsteadiness on feet: Secondary | ICD-10-CM

## 2022-12-08 DIAGNOSIS — S78119A Complete traumatic amputation at level between unspecified hip and knee, initial encounter: Secondary | ICD-10-CM | POA: Diagnosis not present

## 2022-12-08 DIAGNOSIS — R262 Difficulty in walking, not elsewhere classified: Secondary | ICD-10-CM
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
## 2022-12-10 ENCOUNTER — Ambulatory Visit: Payer: Medicaid Other

## 2022-12-10 DIAGNOSIS — R262 Difficulty in walking, not elsewhere classified: Secondary | ICD-10-CM

## 2022-12-10 DIAGNOSIS — S78119A Complete traumatic amputation at level between unspecified hip and knee, initial encounter: Secondary | ICD-10-CM | POA: Diagnosis not present

## 2022-12-10 DIAGNOSIS — R2681 Unsteadiness on feet: Secondary | ICD-10-CM
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
## 2022-12-15 ENCOUNTER — Ambulatory Visit: Payer: Medicaid Other

## 2022-12-15 DIAGNOSIS — R262 Difficulty in walking, not elsewhere classified: Secondary | ICD-10-CM

## 2022-12-15 DIAGNOSIS — S78119A Complete traumatic amputation at level between unspecified hip and knee, initial encounter: Secondary | ICD-10-CM | POA: Diagnosis not present

## 2022-12-15 DIAGNOSIS — R2681 Unsteadiness on feet: Secondary | ICD-10-CM
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
## 2022-12-18 ENCOUNTER — Ambulatory Visit: Payer: Medicaid Other

## 2022-12-18 DIAGNOSIS — R262 Difficulty in walking, not elsewhere classified: Secondary | ICD-10-CM

## 2022-12-18 DIAGNOSIS — R2681 Unsteadiness on feet: Secondary | ICD-10-CM

## 2022-12-18 DIAGNOSIS — S78119A Complete traumatic amputation at level between unspecified hip and knee, initial encounter: Secondary | ICD-10-CM

## 2022-12-22 ENCOUNTER — Ambulatory Visit: Payer: MEDICAID

## 2022-12-24 ENCOUNTER — Telehealth: Payer: Self-pay

## 2022-12-24 ENCOUNTER — Ambulatory Visit: Payer: MEDICAID | Attending: Nurse Practitioner

## 2022-12-24 NOTE — Telephone Encounter (Signed)
.  Patient Name: Charles [name] MRN: [id number] DOB:[birth year], 61 y.o., male Today's Date: 12/24/2022  Phone call at 15:15 to patient. He answered and reported his Medicaid provider changed and he had to cancel his Tues visit due to new Medicaid and transportation not available and then they didn't show up today. His phone cut out after that and unable to reach him to finish conversation   Lenda Kelp, PT 12/24/2022, 3:18 PM

## 2022-12-29 ENCOUNTER — Ambulatory Visit: Payer: MEDICAID

## 2022-12-31 ENCOUNTER — Ambulatory Visit: Payer: MEDICAID

## 2023-01-05 ENCOUNTER — Ambulatory Visit: Payer: MEDICAID

## 2023-01-07 ENCOUNTER — Ambulatory Visit: Payer: MEDICAID

## 2023-01-08 ENCOUNTER — Telehealth: Payer: Self-pay | Admitting: Internal Medicine

## 2023-01-08 NOTE — Telephone Encounter (Signed)
Received O2 therapy order from [facility]. Gave to [facility] for signature-nm

## 2023-01-14 ENCOUNTER — Telehealth: Payer: Self-pay | Admitting: Internal Medicine

## 2023-01-14 ENCOUNTER — Ambulatory Visit: Payer: MEDICAID

## 2023-01-14 NOTE — Telephone Encounter (Signed)
O2 Therapy order signed. Faxed back to [facility]; [phone]. Scanned-Toni

## 2023-01-19 ENCOUNTER — Ambulatory Visit: Payer: MEDICAID

## 2023-01-21 ENCOUNTER — Ambulatory Visit: Payer: MEDICAID

## 2023-01-25 ENCOUNTER — Ambulatory Visit: Payer: MEDICAID | Admitting: Physician Assistant

## 2023-01-26 ENCOUNTER — Ambulatory Visit: Payer: MEDICAID

## 2023-01-28 ENCOUNTER — Ambulatory Visit: Payer: MEDICAID

## 2023-02-02 ENCOUNTER — Ambulatory Visit: Payer: MEDICAID

## 2023-02-04 ENCOUNTER — Ambulatory Visit: Payer: MEDICAID

## 2023-02-06 ENCOUNTER — Emergency Department: Payer: MEDICAID

## 2023-02-06 ENCOUNTER — Emergency Department
Admission: EM | Admit: 2023-02-06 | Discharge: 2023-02-06 | Disposition: A | Discharge status: Home / Self Care | Payer: MEDICAID | Attending: Emergency Medicine | Admitting: Emergency Medicine

## 2023-02-06 DIAGNOSIS — E119 Type 2 diabetes mellitus without complications: Secondary | ICD-10-CM | POA: Diagnosis not present

## 2023-02-06 DIAGNOSIS — I2510 Atherosclerotic heart disease of native coronary artery without angina pectoris: Secondary | ICD-10-CM | POA: Diagnosis not present

## 2023-02-06 DIAGNOSIS — R0789 Other chest pain: Secondary | ICD-10-CM | POA: Diagnosis present

## 2023-02-06 DIAGNOSIS — R079 Chest pain, unspecified: Secondary | ICD-10-CM

## 2023-02-06 DIAGNOSIS — R000 Tachycardia, unspecified: Secondary | ICD-10-CM | POA: Insufficient documentation

## 2023-02-06 DIAGNOSIS — I10 Essential (primary) hypertension: Secondary | ICD-10-CM | POA: Insufficient documentation

## 2023-02-06 LAB — CBC WITH DIFFERENTIAL/PLATELET
Abs Immature Granulocytes: 0.02 10*3/uL (ref 0.00–0.07)
Basophils Absolute: 0.1 10*3/uL (ref 0.0–0.1)
Basophils Relative: 1 %
Eosinophils Absolute: 0.2 10*3/uL (ref 0.0–0.5)
Eosinophils Relative: 3 %
HCT: 39.6 % (ref 39.0–52.0)
Hemoglobin: 12.1 g/dL — ABNORMAL LOW (ref 13.0–17.0)
Immature Granulocytes: 0 %
Lymphocytes Relative: 34 %
Lymphs Abs: 2.3 10*3/uL (ref 0.7–4.0)
MCH: 25.9 pg — ABNORMAL LOW (ref 26.0–34.0)
MCHC: 30.6 g/dL (ref 30.0–36.0)
MCV: 84.6 fL (ref 80.0–100.0)
Monocytes Absolute: 0.5 10*3/uL (ref 0.1–1.0)
Monocytes Relative: 8 %
Neutro Abs: 3.6 10*3/uL (ref 1.7–7.7)
Neutrophils Relative %: 54 %
Platelets: 261 10*3/uL (ref 150–400)
RBC: 4.68 MIL/uL (ref 4.22–5.81)
RDW: 18.4 % — ABNORMAL HIGH (ref 11.5–15.5)
WBC: 6.7 10*3/uL (ref 4.0–10.5)
nRBC: 0 % (ref 0.0–0.2)

## 2023-02-06 LAB — BASIC METABOLIC PANEL
Anion gap: 18 — ABNORMAL HIGH (ref 5–15)
BUN: 13 mg/dL (ref 8–23)
CO2: 19 mmol/L — ABNORMAL LOW (ref 22–32)
Calcium: 8.6 mg/dL — ABNORMAL LOW (ref 8.9–10.3)
Chloride: 103 mmol/L (ref 98–111)
Creatinine, Ser: 0.82 mg/dL (ref 0.61–1.24)
GFR, Estimated: >60 mL/min (ref >60)
Glucose, Bld: 85 mg/dL (ref 70–99)
Potassium: 4 mmol/L (ref 3.5–5.1)
Sodium: 140 mmol/L (ref 135–145)

## 2023-02-06 LAB — TROPONIN I (HIGH SENSITIVITY)
Troponin I (High Sensitivity): 8 ng/L (ref <18)
Troponin I (High Sensitivity): 9 ng/L (ref <18)

## 2023-02-06 MED ORDER — MORPHINE SULFATE (PF) 4 MG/ML IV SOLN
4.0000 mg | Freq: Once | INTRAVENOUS | Status: AC
  Administered 2023-02-06: 4 mg via INTRAVENOUS
  Filled 2023-02-06: qty 1

## 2023-02-06 MED ORDER — ONDANSETRON HCL 4 MG/2ML IJ SOLN
4.0000 mg | Freq: Once | INTRAMUSCULAR | Status: AC
  Administered 2023-02-06: 4 mg via INTRAVENOUS
  Filled 2023-02-06: qty 2

## 2023-02-06 NOTE — ED Notes (Signed)
Assumed care of patient at this time

## 2023-02-06 NOTE — ED Triage Notes (Signed)
Sudden onset of chest heaviness that began approx 30 minutes prior to arrival; EMS administered 324 mg ASA, NTG Sl x 2, and 1" NTG paste that gave him some relief

## 2023-02-06 NOTE — ED Provider Notes (Signed)
[facility] ([facility]) Provider Note    Event Date/Time   First MD Initiated Contact with Patient 02/06/23 1641     (approximate)   History   Chief Complaint Chest Pain   HPI  Charles [name] is a 62 y.o. male with past medical history of hypertension, diabetes, CAD, left BKA, and right TMA who presents to the ED complaining of chest pain.  Patient reports that about 30 minutes prior to arrival he had sudden onset of pressure in the left side of his chest.  Pain has been constant since then and he subsequently called EMS, reports pain improved following 2 sublingual nitro, Nitropaste was also applied to his chest.  Patient reports that he was feeling fine prior to the onset of symptoms, has not had any recent fevers, cough, or difficulty breathing.  He is status post left BKA, has not noticed any pain or swelling in his right leg.     Physical Exam   Triage Vital Signs: ED Triage Vitals  Encounter Vitals Group     BP      Systolic BP Percentile      Diastolic BP Percentile      Pulse      Resp      Temp      Temp src      SpO2      Weight      Height      Head Circumference      Peak Flow      Pain Score      Pain Loc      Pain Education      Exclude from Growth Chart     Most recent vital signs: Vitals:   02/06/23 1702 02/06/23 1800  BP: (!) 140/89 123/83  Pulse: (!) 104 94  Resp: 12   Temp:    SpO2:  97%    Constitutional: Alert and oriented. Eyes: Conjunctivae are normal. Head: Atraumatic. Nose: No congestion/rhinnorhea. Mouth/Throat: Mucous membranes are moist.  Cardiovascular: Normal rate, regular rhythm. Grossly normal heart sounds.  2+ radial pulses bilaterally. Respiratory: Normal respiratory effort.  No retractions. Lungs CTAB.  Left chest wall tenderness to palpation noted. Gastrointestinal: Soft and nontender. No distention. Musculoskeletal: No lower extremity tenderness nor edema.  Neurologic:  Normal speech and language. No  gross focal neurologic deficits are appreciated.    ED Results / Procedures / Treatments   Labs (all labs ordered are listed, but only abnormal results are displayed) Labs Reviewed  CBC WITH DIFFERENTIAL/PLATELET - Abnormal; Notable for the following components:      Result Value   Hemoglobin 12.1 (*)    MCH 25.9 (*)    RDW 18.4 (*)    All other components within normal limits  BASIC METABOLIC PANEL - Abnormal; Notable for the following components:   CO2 19 (*)    Calcium 8.6 (*)    Anion gap 18 (*)    All other components within normal limits  TROPONIN I (HIGH SENSITIVITY)  TROPONIN I (HIGH SENSITIVITY)     EKG  ED ECG REPORT I, Chesley Noon, the attending physician, personally viewed and interpreted this ECG.   Date: 02/06/2023  EKG Time: 16:43  Rate: 104  Rhythm: sinus tachycardia  Axis: Normal  Intervals:none  ST&T Change: None  RADIOLOGY Chest x-ray reviewed and interpreted by me with no infiltrate, edema, or effusion.  PROCEDURES:  Critical Care performed: No  Procedures   MEDICATIONS ORDERED IN ED: Medications  morphine (  PF) 4 MG/ML injection 4 mg (4 mg Intravenous Given 02/06/23 1713)  ondansetron (ZOFRAN) injection 4 mg (4 mg Intravenous Given 02/06/23 1858)     IMPRESSION / MDM / ASSESSMENT AND PLAN / ED COURSE  I reviewed the triage vital signs and the nursing notes.                              62 y.o. male with past medical history of hypertension, diabetes, CAD, left BKA, and right TMA who presents to the ED with sudden onset chest pressure about 30 minutes prior to arrival.   Patient's presentation is most consistent with acute presentation with potential threat to life or bodily function.  Differential diagnosis includes, but is not limited to, ACS, PE, dissection, pneumonia, pneumothorax, musculoskeletal pain, GERD, anxiety.  Patient nontoxic-appearing and in no acute distress, vital signs are unremarkable.  EKG shows no evidence  of arrhythmia or ischemia, we will check 2 sets of troponin given acute onset of pain.  Low suspicion for PE or dissection at this time, will treat symptomatically with IV morphine.  Labs and chest x-ray are also pending.  2 sets of troponin are within normal limits and chest x-ray is unremarkable, low suspicion for ACS or PE given pain is reproducible with palpation.  Patient reports pain improved on reassessment and he is appropriate for outpatient management.  Remainder of labs are reassuring with no significant anemia, leukocytosis, electrolyte abnormality, or AKI.  He was counseled to follow-up with his cardiologist and to return to the ED for new or worsening symptoms, patient agrees with plan.      FINAL CLINICAL IMPRESSION(S) / ED DIAGNOSES   Final diagnoses:  Nonspecific chest pain     Rx / DC Orders   ED Discharge Orders          Ordered    Ambulatory referral to Cardiology        02/06/23 1940             Note:  This document was prepared using Dragon voice recognition software and may include unintentional dictation errors.   Chesley Noon, MD 02/06/23 [phone]

## 2023-02-10 ENCOUNTER — Other Ambulatory Visit: Payer: Self-pay | Admitting: Physician Assistant

## 2023-02-10 DIAGNOSIS — G4719 Other hypersomnia: Secondary | ICD-10-CM

## 2023-02-11 ENCOUNTER — Telehealth: Payer: MEDICAID | Admitting: Physician Assistant

## 2023-02-11 ENCOUNTER — Telehealth: Payer: Self-pay

## 2023-02-11 VITALS — Ht 73.0 in | Wt 260.0 lb

## 2023-02-11 DIAGNOSIS — Z89512 Acquired absence of left leg below knee: Secondary | ICD-10-CM

## 2023-02-11 DIAGNOSIS — Z89612 Acquired absence of left leg above knee: Secondary | ICD-10-CM

## 2023-02-11 DIAGNOSIS — F329 Major depressive disorder, single episode, unspecified: Secondary | ICD-10-CM | POA: Diagnosis not present

## 2023-02-11 DIAGNOSIS — F4321 Adjustment disorder with depressed mood: Secondary | ICD-10-CM

## 2023-02-11 DIAGNOSIS — F5101 Primary insomnia: Secondary | ICD-10-CM

## 2023-02-11 DIAGNOSIS — G4719 Other hypersomnia: Secondary | ICD-10-CM

## 2023-02-11 DIAGNOSIS — I10 Essential (primary) hypertension: Secondary | ICD-10-CM | POA: Diagnosis not present

## 2023-02-11 DIAGNOSIS — F411 Generalized anxiety disorder: Secondary | ICD-10-CM

## 2023-02-11 MED ORDER — LISDEXAMFETAMINE DIMESYLATE 40 MG PO CAPS: 40.0000 mg | ORAL_CAPSULE | ORAL | 0 refills | Status: DC
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
## 2023-02-11 NOTE — Telephone Encounter (Signed)
Pt advised that we we are unable to do home health due to no one taking his insurance we will try home health aide

## 2023-02-12 ENCOUNTER — Telehealth: Payer: Self-pay | Admitting: *Deleted

## 2023-02-12 ENCOUNTER — Other Ambulatory Visit: Payer: Self-pay | Admitting: Physician Assistant

## 2023-02-12 ENCOUNTER — Telehealth: Payer: Self-pay

## 2023-02-12 NOTE — Progress Notes (Signed)
Care Coordination   Note   02/12/2023 Name: Charles [name] MRN: [id number] DOB: [birth year]  Charles [name] is a 62 y.o. year old male who sees McDonough, Salomon Fick, PA-C for primary care. I reached out to [name] by phone today to offer care coordination services.  [name] was given information about Care Coordination services today including:   The Care Coordination services include support from the care team which includes your Nurse Coordinator, Clinical Social Worker, or Pharmacist.  The Care Coordination team is here to help remove barriers to the health concerns and goals most important to you. Care Coordination services are voluntary, and the patient may decline or stop services at any time by request to their care team member.   Care Coordination Consent Status: Patient agreed to services and verbal consent obtained.   Follow up plan:  Telephone appointment with care coordination team member scheduled for:  02/18/2023  Encounter Outcome:  Patient Scheduled from referral   Burman Nieves, [facility] Care Coordination Care Guide Direct Dial: [phone]

## 2023-02-12 NOTE — Telephone Encounter (Signed)
Faxed [facility] for home health aide

## 2023-02-18 ENCOUNTER — Ambulatory Visit: Payer: Self-pay | Admitting: *Deleted

## 2023-02-18 NOTE — Patient Outreach (Signed)
Care Coordination   02/18/2023 Name: Charles [name] MRN: [id number] DOB: [birth year]   Care Coordination Outreach Attempts:  An unsuccessful telephone outreach was attempted for a scheduled appointment today.  Follow Up Plan:  Additional outreach attempts will be made to offer the patient care coordination information and services.   Encounter Outcome:  Patient Visit Completed   Care Coordination Interventions:  No, not indicated    Mieczyslaw Stamas, LCSW [place]  [facility], [facility] Licensed Clinical Social Worker Care Coordinator  Direct Dial: [phone]

## 2023-03-04 ENCOUNTER — Telehealth: Payer: Self-pay | Admitting: Physician Assistant

## 2023-03-04 ENCOUNTER — Telehealth: Payer: MEDICAID | Admitting: Physician Assistant

## 2023-03-04 ENCOUNTER — Encounter: Payer: Self-pay | Admitting: Physician Assistant

## 2023-03-04 VITALS — Resp 16 | Ht 73.0 in

## 2023-03-04 DIAGNOSIS — G4734 Idiopathic sleep related nonobstructive alveolar hypoventilation: Secondary | ICD-10-CM | POA: Diagnosis not present

## 2023-03-04 DIAGNOSIS — F329 Major depressive disorder, single episode, unspecified: Secondary | ICD-10-CM

## 2023-03-04 MED ORDER — BUPROPION HCL ER (XL) 300 MG PO TB24
300.0000 mg | ORAL_TABLET | Freq: Every day | ORAL | 2 refills | Status: DC
Start: 2023-03-04 — End: 2023-03-13
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
## 2023-03-04 NOTE — Telephone Encounter (Signed)
BH referral faxed to [facility]; [phone]. Notified patient. Gave pt telephone # [phone] ext [phone]-Toni

## 2023-03-04 NOTE — Telephone Encounter (Signed)
Notified [facility] of overnight pulseox order-Toni

## 2023-03-09 ENCOUNTER — Encounter: Payer: Self-pay | Admitting: Psychiatry

## 2023-03-09 ENCOUNTER — Inpatient Hospital Stay
Admission: AD | Admit: 2023-03-09 | Discharge: 2023-03-13 | DRG: 885 | Disposition: A | Discharge status: Home / Self Care | Payer: MEDICAID | Source: Intra-hospital | Attending: Psychiatry | Admitting: Psychiatry

## 2023-03-09 ENCOUNTER — Emergency Department
Admission: EM | Admit: 2023-03-09 | Discharge: 2023-03-09 | Disposition: A | Discharge status: Other Acute Inpatient Hospital | Payer: MEDICAID | Attending: Emergency Medicine | Admitting: Emergency Medicine

## 2023-03-09 ENCOUNTER — Other Ambulatory Visit: Payer: Self-pay

## 2023-03-09 DIAGNOSIS — E039 Hypothyroidism, unspecified: Secondary | ICD-10-CM | POA: Diagnosis present

## 2023-03-09 DIAGNOSIS — N400 Enlarged prostate without lower urinary tract symptoms: Secondary | ICD-10-CM | POA: Diagnosis present

## 2023-03-09 DIAGNOSIS — F322 Major depressive disorder, single episode, severe without psychotic features: Secondary | ICD-10-CM

## 2023-03-09 DIAGNOSIS — M109 Gout, unspecified: Secondary | ICD-10-CM | POA: Diagnosis present

## 2023-03-09 DIAGNOSIS — Z5986 Financial insecurity: Secondary | ICD-10-CM

## 2023-03-09 DIAGNOSIS — I10 Essential (primary) hypertension: Secondary | ICD-10-CM | POA: Diagnosis present

## 2023-03-09 DIAGNOSIS — K219 Gastro-esophageal reflux disease without esophagitis: Secondary | ICD-10-CM | POA: Diagnosis present

## 2023-03-09 DIAGNOSIS — E785 Hyperlipidemia, unspecified: Secondary | ICD-10-CM | POA: Diagnosis present

## 2023-03-09 DIAGNOSIS — J45909 Unspecified asthma, uncomplicated: Secondary | ICD-10-CM | POA: Insufficient documentation

## 2023-03-09 DIAGNOSIS — Z79899 Other long term (current) drug therapy: Secondary | ICD-10-CM

## 2023-03-09 DIAGNOSIS — Z79890 Hormone replacement therapy (postmenopausal): Secondary | ICD-10-CM

## 2023-03-09 DIAGNOSIS — F332 Major depressive disorder, recurrent severe without psychotic features: Principal | ICD-10-CM | POA: Diagnosis present

## 2023-03-09 DIAGNOSIS — G4700 Insomnia, unspecified: Secondary | ICD-10-CM | POA: Diagnosis present

## 2023-03-09 DIAGNOSIS — G8929 Other chronic pain: Secondary | ICD-10-CM | POA: Diagnosis present

## 2023-03-09 DIAGNOSIS — F988 Other specified behavioral and emotional disorders with onset usually occurring in childhood and adolescence: Secondary | ICD-10-CM | POA: Diagnosis present

## 2023-03-09 DIAGNOSIS — Z8616 Personal history of COVID-19: Secondary | ICD-10-CM

## 2023-03-09 DIAGNOSIS — R45851 Suicidal ideations: Secondary | ICD-10-CM | POA: Diagnosis present

## 2023-03-09 DIAGNOSIS — F10139 Alcohol abuse with withdrawal, unspecified: Secondary | ICD-10-CM | POA: Diagnosis present

## 2023-03-09 DIAGNOSIS — Z993 Dependence on wheelchair: Secondary | ICD-10-CM

## 2023-03-09 DIAGNOSIS — Z9682 Presence of neurostimulator: Secondary | ICD-10-CM

## 2023-03-09 DIAGNOSIS — Z888 Allergy status to other drugs, medicaments and biological substances status: Secondary | ICD-10-CM

## 2023-03-09 DIAGNOSIS — Z89612 Acquired absence of left leg above knee: Secondary | ICD-10-CM

## 2023-03-09 DIAGNOSIS — F1099 Alcohol use, unspecified with unspecified alcohol-induced disorder: Secondary | ICD-10-CM | POA: Insufficient documentation

## 2023-03-09 DIAGNOSIS — F329 Major depressive disorder, single episode, unspecified: Principal | ICD-10-CM | POA: Diagnosis present

## 2023-03-09 DIAGNOSIS — F32A Depression, unspecified: Secondary | ICD-10-CM

## 2023-03-09 DIAGNOSIS — F1090 Alcohol use, unspecified, uncomplicated: Secondary | ICD-10-CM | POA: Insufficient documentation

## 2023-03-09 DIAGNOSIS — Z89421 Acquired absence of other right toe(s): Secondary | ICD-10-CM

## 2023-03-09 DIAGNOSIS — E119 Type 2 diabetes mellitus without complications: Secondary | ICD-10-CM | POA: Diagnosis not present

## 2023-03-09 DIAGNOSIS — Z9884 Bariatric surgery status: Secondary | ICD-10-CM | POA: Diagnosis not present

## 2023-03-09 DIAGNOSIS — E1151 Type 2 diabetes mellitus with diabetic peripheral angiopathy without gangrene: Secondary | ICD-10-CM | POA: Diagnosis present

## 2023-03-09 DIAGNOSIS — I2510 Atherosclerotic heart disease of native coronary artery without angina pectoris: Secondary | ICD-10-CM | POA: Diagnosis present

## 2023-03-09 DIAGNOSIS — F419 Anxiety disorder, unspecified: Secondary | ICD-10-CM | POA: Diagnosis present

## 2023-03-09 DIAGNOSIS — E1142 Type 2 diabetes mellitus with diabetic polyneuropathy: Secondary | ICD-10-CM | POA: Diagnosis present

## 2023-03-09 DIAGNOSIS — Z79891 Long term (current) use of opiate analgesic: Secondary | ICD-10-CM

## 2023-03-09 DIAGNOSIS — M545 Low back pain, unspecified: Secondary | ICD-10-CM | POA: Diagnosis present

## 2023-03-09 DIAGNOSIS — Z955 Presence of coronary angioplasty implant and graft: Secondary | ICD-10-CM

## 2023-03-09 DIAGNOSIS — Z5982 Transportation insecurity: Secondary | ICD-10-CM

## 2023-03-09 DIAGNOSIS — I252 Old myocardial infarction: Secondary | ICD-10-CM | POA: Diagnosis not present

## 2023-03-09 DIAGNOSIS — Z833 Family history of diabetes mellitus: Secondary | ICD-10-CM

## 2023-03-09 DIAGNOSIS — F1010 Alcohol abuse, uncomplicated: Secondary | ICD-10-CM

## 2023-03-09 LAB — COMPREHENSIVE METABOLIC PANEL
ALT: 46 U/L — ABNORMAL HIGH (ref 0–44)
AST: 80 U/L — ABNORMAL HIGH (ref 15–41)
Albumin: 3.9 g/dL (ref 3.5–5.0)
Alkaline Phosphatase: 86 U/L (ref 38–126)
Anion gap: 17 — ABNORMAL HIGH (ref 5–15)
BUN: 11 mg/dL (ref 8–23)
CO2: 22 mmol/L (ref 22–32)
Calcium: 8.6 mg/dL — ABNORMAL LOW (ref 8.9–10.3)
Chloride: 99 mmol/L (ref 98–111)
Creatinine, Ser: 0.81 mg/dL (ref 0.61–1.24)
GFR, Estimated: >60 mL/min (ref >60)
Glucose, Bld: 96 mg/dL (ref 70–99)
Potassium: 3.7 mmol/L (ref 3.5–5.1)
Sodium: 138 mmol/L (ref 135–145)
Total Bilirubin: 0.8 mg/dL (ref <1.2)
Total Protein: 7.9 g/dL (ref 6.5–8.1)

## 2023-03-09 LAB — CBC
HCT: 38.7 % — ABNORMAL LOW (ref 39.0–52.0)
Hemoglobin: 12.5 g/dL — ABNORMAL LOW (ref 13.0–17.0)
MCH: 26.9 pg (ref 26.0–34.0)
MCHC: 32.3 g/dL (ref 30.0–36.0)
MCV: 83.2 fL (ref 80.0–100.0)
Platelets: 241 10*3/uL (ref 150–400)
RBC: 4.65 MIL/uL (ref 4.22–5.81)
RDW: 18.8 % — ABNORMAL HIGH (ref 11.5–15.5)
WBC: 4.9 10*3/uL (ref 4.0–10.5)
nRBC: 0 % (ref 0.0–0.2)

## 2023-03-09 LAB — ETHANOL: Alcohol, Ethyl (B): 232 mg/dL — ABNORMAL HIGH (ref <10)

## 2023-03-09 LAB — URINE DRUG SCREEN, QUALITATIVE (ARMC ONLY)
Amphetamines, Ur Screen: NOT DETECTED
Barbiturates, Ur Screen: NOT DETECTED
Benzodiazepine, Ur Scrn: NOT DETECTED
Cannabinoid 50 Ng, Ur ~~LOC~~: NOT DETECTED
Cocaine Metabolite,Ur ~~LOC~~: NOT DETECTED
MDMA (Ecstasy)Ur Screen: NOT DETECTED
Methadone Scn, Ur: NOT DETECTED
Opiate, Ur Screen: NOT DETECTED
Phencyclidine (PCP) Ur S: NOT DETECTED
Tricyclic, Ur Screen: NOT DETECTED

## 2023-03-09 LAB — ACETAMINOPHEN LEVEL: Acetaminophen (Tylenol), Serum: <10 ug/mL — ABNORMAL LOW (ref 10–30)

## 2023-03-09 LAB — SALICYLATE LEVEL: Salicylate Lvl: <7 mg/dL — ABNORMAL LOW (ref 7.0–30.0)

## 2023-03-09 MED ORDER — LORAZEPAM 1 MG PO TABS
1.0000 mg | ORAL_TABLET | Freq: Three times a day (TID) | ORAL | Status: AC
  Administered 2023-03-11 (×3): 1 mg via ORAL
  Filled 2023-03-09 (×3): qty 1

## 2023-03-09 MED ORDER — LORAZEPAM 1 MG PO TABS: 1.0000 mg | ORAL_TABLET | Freq: Two times a day (BID) | ORAL | Status: AC

## 2023-03-09 MED ORDER — OLANZAPINE 5 MG PO TBDP: 5.0000 mg | ORAL_TABLET | Freq: Three times a day (TID) | ORAL | Status: DC | PRN

## 2023-03-09 MED ORDER — LOPERAMIDE HCL 2 MG PO CAPS
2.0000 mg | ORAL_CAPSULE | ORAL | Status: AC | PRN
  Administered 2023-03-12: 2 mg via ORAL
  Filled 2023-03-09: qty 1

## 2023-03-09 MED ORDER — LORAZEPAM 1 MG PO TABS: 1.0000 mg | ORAL_TABLET | Freq: Four times a day (QID) | ORAL | Status: AC | PRN

## 2023-03-09 MED ORDER — LORAZEPAM 1 MG PO TABS: 1.0000 mg | ORAL_TABLET | Freq: Every day | ORAL | Status: DC

## 2023-03-09 MED ORDER — HYDROXYZINE HCL 25 MG PO TABS
25.0000 mg | ORAL_TABLET | Freq: Four times a day (QID) | ORAL | Status: AC | PRN
  Administered 2023-03-09 – 2023-03-11 (×2): 25 mg via ORAL
  Filled 2023-03-09 (×2): qty 1

## 2023-03-09 MED ORDER — THIAMINE HCL 100 MG/ML IJ SOLN
100.0000 mg | Freq: Once | INTRAMUSCULAR | Status: AC
  Administered 2023-03-09: 100 mg via INTRAMUSCULAR
  Filled 2023-03-09: qty 2

## 2023-03-09 MED ORDER — MAGNESIUM HYDROXIDE 400 MG/5ML PO SUSP
30.0000 mL | Freq: Every day | ORAL | Status: DC | PRN
Start: 2023-03-09 — End: 2023-03-13

## 2023-03-09 MED ORDER — TRAZODONE HCL 50 MG PO TABS
50.0000 mg | ORAL_TABLET | Freq: Every evening | ORAL | Status: DC | PRN
  Administered 2023-03-09: 50 mg via ORAL
  Filled 2023-03-09: qty 1

## 2023-03-09 MED ORDER — GABAPENTIN 100 MG PO CAPS
200.0000 mg | ORAL_CAPSULE | Freq: Once | ORAL | Status: AC
  Administered 2023-03-09: 200 mg via ORAL
  Filled 2023-03-09: qty 2

## 2023-03-09 MED ORDER — ONDANSETRON 4 MG PO TBDP: 4.0000 mg | ORAL_TABLET | Freq: Four times a day (QID) | ORAL | Status: AC | PRN

## 2023-03-09 MED ORDER — ALUM & MAG HYDROXIDE-SIMETH 200-200-20 MG/5ML PO SUSP: 30.0000 mL | ORAL | Status: DC | PRN

## 2023-03-09 MED ORDER — ACETAMINOPHEN 325 MG PO TABS: 650.0000 mg | ORAL_TABLET | Freq: Four times a day (QID) | ORAL | Status: DC | PRN

## 2023-03-09 MED ORDER — THIAMINE MONONITRATE 100 MG PO TABS
100.0000 mg | ORAL_TABLET | Freq: Every day | ORAL | Status: DC
  Administered 2023-03-10 – 2023-03-13 (×4): 100 mg via ORAL
  Filled 2023-03-09 (×4): qty 1

## 2023-03-09 MED ORDER — ADULT MULTIVITAMIN W/MINERALS CH
1.0000 | ORAL_TABLET | Freq: Every day | ORAL | Status: DC
  Administered 2023-03-09 – 2023-03-13 (×5): 1 via ORAL
  Filled 2023-03-09 (×5): qty 1

## 2023-03-09 MED ORDER — HYDROXYZINE HCL 25 MG PO TABS
25.0000 mg | ORAL_TABLET | Freq: Three times a day (TID) | ORAL | Status: DC | PRN
  Administered 2023-03-12: 25 mg via ORAL
  Filled 2023-03-09: qty 1

## 2023-03-09 MED ORDER — OLANZAPINE 10 MG IM SOLR: 5.0000 mg | Freq: Three times a day (TID) | INTRAMUSCULAR | Status: DC | PRN

## 2023-03-09 MED ORDER — LORAZEPAM 1 MG PO TABS
1.0000 mg | ORAL_TABLET | Freq: Four times a day (QID) | ORAL | Status: AC
  Administered 2023-03-09 – 2023-03-10 (×6): 1 mg via ORAL
  Filled 2023-03-09 (×6): qty 1

## 2023-03-09 NOTE — BH Assessment (Signed)
Patient is to be admitted to [facility] today 03/09/23 by Dr.  Marlou Porch .  Attending Physician will be Dr. Marlou Porch.   Patient has been assigned to room 304, by [facility] Charge Nurse Marisue Humble.    ER staff is aware of the admission: Misty Stanley, ER Secretary   Dr. Lenard Lance, ER MD  Merrie Roof, Patient's Nurse  Ethelene Browns, Patient Access.

## 2023-03-09 NOTE — ED Notes (Signed)
Pt provided with lunch tray. Pt sitting up eating.
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
## 2023-03-09 NOTE — ED Triage Notes (Signed)
BIB [facility] from home. Sister stated that pt made comment about "today is the day" in reference to suicide. Pt has been drinking heavily today, reports 1/2 gallon of liquor. Pt crying when RN asked if he was SI, states  "who would want to live like this". [employer] initiated IVC papers PTA. EMS reports VSS.

## 2023-03-09 NOTE — ED Notes (Signed)
IVC PENDING  CONSULT ?

## 2023-03-09 NOTE — ED Provider Notes (Signed)
[facility] Provider Note    Event Date/Time   First MD Initiated Contact with Patient 03/09/23 1106     (approximate)   History   Suicidal   HPI  Charles [name] is a 62 y.o. male who presents to the emergency department today under IVC by police department because of concerns for suicidal ideation.  Patient states that he has been depressed since his wife died.  He does admit to heavy alcohol use.  Per chart review he has had [facility] admission in the past for SI.  Patient states he did drink this morning.     Physical Exam   Triage Vital Signs: ED Triage Vitals  Encounter Vitals Group     BP 03/09/23 1109 114/73     Systolic BP Percentile --      Diastolic BP Percentile --      Pulse Rate 03/09/23 1109 87     Resp 03/09/23 1109 16     Temp 03/09/23 1109 99.2 F (37.3 C)     Temp Source 03/09/23 1109 Oral     SpO2 03/09/23 1109 95 %     Weight 03/09/23 1111 260 lb 2.3 oz (118 kg)     Height 03/09/23 1111 6\' 1"  (1.854 m)     Head Circumference --      Peak Flow --      Pain Score 03/09/23 1111 0     Pain Loc --      Pain Education --      Exclude from Growth Chart --     Most recent vital signs: Vitals:   03/09/23 1109  BP: 114/73  Pulse: 87  Resp: 16  Temp: 99.2 F (37.3 C)  SpO2: 95%   General: Awake, alert, oriented. CV:  Good peripheral perfusion. Regular rate and rhythm. Resp:  Normal effort. Lungs clear. Abd:  No distention.    ED Results / Procedures / Treatments   Labs (all labs ordered are listed, but only abnormal results are displayed) Labs Reviewed  COMPREHENSIVE METABOLIC PANEL - Abnormal; Notable for the following components:      Result Value   Calcium 8.6 (*)    AST 80 (*)    ALT 46 (*)    Anion gap 17 (*)    All other components within normal limits  ETHANOL - Abnormal; Notable for the following components:   Alcohol, Ethyl (B) 232 (*)    All other components within normal limits  SALICYLATE  LEVEL - Abnormal; Notable for the following components:   Salicylate Lvl <7.0 (*)    All other components within normal limits  ACETAMINOPHEN LEVEL - Abnormal; Notable for the following components:   Acetaminophen (Tylenol), Serum <10 (*)    All other components within normal limits  CBC - Abnormal; Notable for the following components:   Hemoglobin 12.5 (*)    HCT 38.7 (*)    RDW 18.8 (*)    All other components within normal limits  URINE DRUG SCREEN, QUALITATIVE ([facility] ONLY)     EKG  None   RADIOLOGY None  PROCEDURES:  Critical Care performed: No   MEDICATIONS ORDERED IN ED: Medications - No data to display   IMPRESSION / MDM / ASSESSMENT AND PLAN / ED COURSE  I reviewed the triage vital signs and the nursing notes.  Differential diagnosis includes, but is not limited to, psychiatric illness, drug induced mood disorder  Patient's presentation is most consistent with acute presentation with potential threat to life or bodily function.   Patient presents to the emergency department today under IVC because of concern for SI. Patient does admit to alcohol use as well. Per chart review has had admission for mental health in the past. Will continue IVC. Will have psychiatry evaluate.   The patient has been placed in psychiatric observation due to the need to provide a safe environment for the patient while obtaining psychiatric consultation and evaluation, as well as ongoing medical and medication management to treat the patient's condition.  The patient has been placed under full IVC at this time.       FINAL CLINICAL IMPRESSION(S) / ED DIAGNOSES   Final diagnoses:  Depression, unspecified depression type  Alcohol abuse      Note:  This document was prepared using Dragon voice recognition software and may include unintentional dictation errors.    Phineas Semen, MD 03/10/23 [phone]

## 2023-03-09 NOTE — ED Notes (Signed)
This RN gave report to the BMU RN taking over Pt care.

## 2023-03-09 NOTE — Group Note (Signed)
Date:  03/09/2023 Time:  9:06 PM  Group Topic/Focus:  Managing Feelings:   The focus of this group is to identify what feelings patients have difficulty handling and develop a plan to handle them in a healthier way upon discharge. Wrap-Up Group:   The focus of this group is to help patients review their daily goal of treatment and discuss progress on daily workbooks.    Participation Level:  Active  Participation Quality:  Appropriate and Attentive  Affect:  Appropriate  Cognitive:  Appropriate  Insight: Appropriate  Engagement in Group:  Improving  Modes of Intervention:  Clarification, Discussion, Education, Rapport Building, Socialization, and Support  Additional Comments:     Charles [name] 03/09/2023, 9:06 PM

## 2023-03-09 NOTE — ED Notes (Signed)
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 Pants, shirt, 1 tennis shoe, 1 sock, necklace and cell phone placed into belonging bag.

## 2023-03-10 DIAGNOSIS — F332 Major depressive disorder, recurrent severe without psychotic features: Secondary | ICD-10-CM | POA: Diagnosis not present

## 2023-03-10 MED ORDER — ALBUTEROL SULFATE (2.5 MG/3ML) 0.083% IN NEBU: 3.0000 mL | INHALATION_SOLUTION | Freq: Four times a day (QID) | RESPIRATORY_TRACT | Status: DC | PRN

## 2023-03-10 MED ORDER — CARVEDILOL 3.125 MG PO TABS
3.1250 mg | ORAL_TABLET | Freq: Two times a day (BID) | ORAL | Status: DC
  Administered 2023-03-10 – 2023-03-13 (×6): 3.125 mg via ORAL
  Filled 2023-03-10 (×7): qty 1

## 2023-03-10 MED ORDER — ATORVASTATIN CALCIUM 20 MG PO TABS
40.0000 mg | ORAL_TABLET | Freq: Every day | ORAL | Status: DC
  Administered 2023-03-11 – 2023-03-13 (×3): 40 mg via ORAL
  Filled 2023-03-10 (×3): qty 2

## 2023-03-10 MED ORDER — VITAMIN C 500 MG PO TABS
500.0000 mg | ORAL_TABLET | Freq: Every day | ORAL | Status: DC
  Administered 2023-03-11 – 2023-03-13 (×3): 500 mg via ORAL
  Filled 2023-03-10 (×3): qty 1

## 2023-03-10 MED ORDER — DULOXETINE HCL 30 MG PO CPEP: 60.0000 mg | ORAL_CAPSULE | Freq: Every day | ORAL | Status: DC

## 2023-03-10 MED ORDER — CARVEDILOL 3.125 MG PO TABS: 3.1250 mg | ORAL_TABLET | Freq: Two times a day (BID) | ORAL | Status: DC

## 2023-03-10 MED ORDER — RISPERIDONE 1 MG PO TABS
0.5000 mg | ORAL_TABLET | ORAL | Status: DC
Start: 2023-03-11 — End: 2023-03-10

## 2023-03-10 MED ORDER — ZINC SULFATE 220 (50 ZN) MG PO CAPS
220.0000 mg | ORAL_CAPSULE | Freq: Every day | ORAL | Status: DC
  Administered 2023-03-11 – 2023-03-13 (×3): 220 mg via ORAL
  Filled 2023-03-10 (×3): qty 1

## 2023-03-10 MED ORDER — ZOLPIDEM TARTRATE 5 MG PO TABS: 10.0000 mg | ORAL_TABLET | Freq: Every day | ORAL | Status: DC

## 2023-03-10 MED ORDER — VITAMIN D 25 MCG (1000 UNIT) PO TABS
2000.0000 [IU] | ORAL_TABLET | Freq: Every day | ORAL | Status: DC
  Administered 2023-03-11 – 2023-03-13 (×3): 2000 [IU] via ORAL
  Filled 2023-03-10 (×4): qty 2

## 2023-03-10 MED ORDER — RISPERIDONE 1 MG PO TABS
0.5000 mg | ORAL_TABLET | ORAL | Status: DC
  Administered 2023-03-10 – 2023-03-13 (×6): 0.5 mg via ORAL
  Filled 2023-03-10 (×6): qty 1

## 2023-03-10 MED ORDER — FUROSEMIDE 20 MG PO TABS: 20.0000 mg | ORAL_TABLET | Freq: Every day | ORAL | Status: DC | PRN

## 2023-03-10 MED ORDER — BUPROPION HCL ER (XL) 150 MG PO TB24
300.0000 mg | ORAL_TABLET | Freq: Every day | ORAL | Status: DC
  Administered 2023-03-11 – 2023-03-13 (×3): 300 mg via ORAL
  Filled 2023-03-10 (×3): qty 2

## 2023-03-10 MED ORDER — AMLODIPINE BESYLATE 5 MG PO TABS
5.0000 mg | ORAL_TABLET | Freq: Every day | ORAL | Status: DC
  Administered 2023-03-11 – 2023-03-13 (×3): 5 mg via ORAL
  Filled 2023-03-10 (×3): qty 1

## 2023-03-10 MED ORDER — TRAZODONE HCL 100 MG PO TABS
100.0000 mg | ORAL_TABLET | Freq: Every evening | ORAL | Status: DC | PRN
  Administered 2023-03-10 – 2023-03-12 (×3): 100 mg via ORAL
  Filled 2023-03-10 (×4): qty 1

## 2023-03-10 MED ORDER — LEVOTHYROXINE SODIUM 50 MCG PO TABS
75.0000 ug | ORAL_TABLET | Freq: Every day | ORAL | Status: DC
Start: 2023-03-11 — End: 2023-03-13
  Administered 2023-03-11 – 2023-03-13 (×3): 75 ug via ORAL
  Filled 2023-03-10 (×3): qty 2

## 2023-03-10 MED ORDER — DULOXETINE HCL 30 MG PO CPEP
60.0000 mg | ORAL_CAPSULE | Freq: Every day | ORAL | Status: DC
Start: 2023-03-10 — End: 2023-03-13
  Administered 2023-03-10 – 2023-03-13 (×4): 60 mg via ORAL
  Filled 2023-03-10 (×4): qty 2

## 2023-03-10 NOTE — BH IP Treatment Plan (Signed)
Interdisciplinary Treatment and Diagnostic Plan Update  03/10/2023 Time of Session: 9:20AM Charles [name] MRN: [id number]  Principal Diagnosis: Major depressive disorder, recurrent severe without psychotic features (HCC)  Secondary Diagnoses: Principal Problem:   Major depressive disorder, recurrent severe without psychotic features (HCC)   Current Medications:  Current Facility-Administered Medications  Medication Dose Route Frequency Provider Last Rate Last Admin   acetaminophen (TYLENOL) tablet 650 mg  650 mg Oral Q6H PRN Lauree Chandler, NP       alum & mag hydroxide-simeth (MAALOX/MYLANTA) 200-200-20 MG/5ML suspension 30 mL  30 mL Oral Q4H PRN Lauree Chandler, NP       Melene Muller ON 03/12/2023] hydrOXYzine (ATARAX) tablet 25 mg  25 mg Oral TID PRN Lauree Chandler, NP       hydrOXYzine (ATARAX) tablet 25 mg  25 mg Oral Q6H PRN Lauree Chandler, NP   25 mg at 03/09/23 2242   loperamide (IMODIUM) capsule 2-4 mg  2-4 mg Oral PRN Lauree Chandler, NP       LORazepam (ATIVAN) tablet 1 mg  1 mg Oral Q6H PRN Lauree Chandler, NP       LORazepam (ATIVAN) tablet 1 mg  1 mg Oral QID Lauree Chandler, NP   1 mg at 03/10/23 5784   Followed by   Melene Muller ON 03/11/2023] LORazepam (ATIVAN) tablet 1 mg  1 mg Oral TID Lauree Chandler, NP       Followed by   Melene Muller ON 03/12/2023] LORazepam (ATIVAN) tablet 1 mg  1 mg Oral BID Lauree Chandler, NP       Followed by   Melene Muller ON 03/13/2023] LORazepam (ATIVAN) tablet 1 mg  1 mg Oral Daily Lauree Chandler, NP       magnesium hydroxide (MILK OF MAGNESIA) suspension 30 mL  30 mL Oral Daily PRN Lauree Chandler, NP       multivitamin with minerals tablet 1 tablet  1 tablet Oral Daily Lauree Chandler, NP   1 tablet at 03/10/23 0839   OLANZapine (ZYPREXA) injection 5 mg  5 mg Intramuscular TID PRN Lauree Chandler, NP       OLANZapine zydis (ZYPREXA) disintegrating tablet 5 mg  5 mg Oral TID PRN Lauree Chandler, NP        ondansetron (ZOFRAN-ODT) disintegrating tablet 4 mg  4 mg Oral Q6H PRN Lauree Chandler, NP       thiamine (VITAMIN B1) tablet 100 mg  100 mg Oral Daily Lauree Chandler, NP   100 mg at 03/10/23 6962   traZODone (DESYREL) tablet 50 mg  50 mg Oral QHS PRN Lauree Chandler, NP   50 mg at 03/09/23 2116   PTA Medications: Medications Prior to Admission  Medication Sig Dispense Refill Last Dose   albuterol (PROVENTIL HFA;VENTOLIN HFA) 108 (90 Base) MCG/ACT inhaler Inhale 2 puffs into the lungs every 6 (six) hours as needed for wheezing or shortness of breath. 1 Inhaler 5    amLODipine (NORVASC) 5 MG tablet Take 1 tablet (5 mg total) by mouth daily. 30 tablet 3    ascorbic acid (VITAMIN C) 500 MG tablet Take 1 tablet (500 mg total) by mouth daily. 30 tablet 0    atorvastatin (LIPITOR) 40 MG tablet TAKE ONE TABLET BY MOUTH AT BEDTIME FOR CHLOESTEROL 90 tablet 1    buPROPion (WELLBUTRIN XL) 300 MG 24 hr tablet Take 1 tablet (300 mg total) by mouth daily. 30 tablet 2  carvedilol (COREG) 3.125 MG tablet Take 1 tablet (3.125 mg total) by mouth 2 (two) times daily with a meal. 30 tablet 3    cholecalciferol (VITAMIN D) 25 MCG tablet Take 2 tablets (2,000 Units total) by mouth daily. 60 tablet 0    DULoxetine (CYMBALTA) 60 MG capsule Take 1 capsule (60 mg total) by mouth daily. 30 capsule 3    furosemide (LASIX) 20 MG tablet Take 1 tablet (20 mg total) by mouth daily as needed. For swelling 30 tablet 3    gabapentin (NEURONTIN) 100 MG capsule Take 2 capsules (200 mg total) by mouth 3 (three) times daily. (Patient not taking: Reported on 03/09/2023) 90 capsule 3    levothyroxine (SYNTHROID) 75 MCG tablet Take 1 tablet (75 mcg total) by mouth daily at 6 (six) AM. 30 tablet 3    lisdexamfetamine (VYVANSE) 40 MG capsule Take 1 capsule (40 mg total) by mouth every morning. 30 capsule 0    risperiDONE (RISPERDAL) 0.5 MG tablet Take 1 tablet (0.5 mg total) by mouth 2 (two) times daily at 8 am and 4  pm. 60 tablet 3    sildenafil (REVATIO) 20 MG tablet TAKE 1 TABLET BY MOUTH DAILY AS NEEDED FOR ERECTILE DYSFUNCTION 30 tablet 1    traZODone (DESYREL) 100 MG tablet Take 1 tablet (100 mg total) by mouth at bedtime as needed for sleep. 30 tablet 3    zinc sulfate 220 (50 Zn) MG capsule Take 1 capsule (220 mg total) by mouth daily. 30 capsule 0    zolpidem (AMBIEN) 10 MG tablet TAKE ONE TABLET AT BEDTIME IF NEEDED FORSLEEP 30 tablet 2     Patient Stressors:    Patient Strengths:    Treatment Modalities: Medication Management, Group therapy, Case management,  1 to 1 session with clinician, Psychoeducation, Recreational therapy.   Physician Treatment Plan for Primary Diagnosis: Major depressive disorder, recurrent severe without psychotic features (HCC) Long Term Goal(s):     Short Term Goals:    Medication Management: Evaluate patient's response, side effects, and tolerance of medication regimen.  Therapeutic Interventions: 1 to 1 sessions, Unit Group sessions and Medication administration.  Evaluation of Outcomes: Not Met  Physician Treatment Plan for Secondary Diagnosis: Principal Problem:   Major depressive disorder, recurrent severe without psychotic features (HCC)  Long Term Goal(s):     Short Term Goals:       Medication Management: Evaluate patient's response, side effects, and tolerance of medication regimen.  Therapeutic Interventions: 1 to 1 sessions, Unit Group sessions and Medication administration.  Evaluation of Outcomes: Not Met   RN Treatment Plan for Primary Diagnosis: Major depressive disorder, recurrent severe without psychotic features (HCC) Long Term Goal(s): Knowledge of disease and therapeutic regimen to maintain health will improve  Short Term Goals: Ability to remain free from injury will improve, Ability to verbalize frustration and anger appropriately will improve, Ability to demonstrate self-control, Ability to participate in decision making will  improve, Ability to verbalize feelings will improve, and Ability to identify and develop effective coping behaviors will improve  Medication Management: RN will administer medications as ordered by provider, will assess and evaluate patient's response and provide education to patient for prescribed medication. RN will report any adverse and/or side effects to prescribing provider.  Therapeutic Interventions: 1 on 1 counseling sessions, Psychoeducation, Medication administration, Evaluate responses to treatment, Monitor vital signs and CBGs as ordered, Perform/monitor CIWA, COWS, AIMS and Fall Risk screenings as ordered, Perform wound care treatments as ordered.  Evaluation of  Outcomes: Not Met   LCSW Treatment Plan for Primary Diagnosis: Major depressive disorder, recurrent severe without psychotic features (HCC) Long Term Goal(s): Safe transition to appropriate next level of care at discharge, Engage patient in therapeutic group addressing interpersonal concerns.  Short Term Goals: Engage patient in aftercare planning with referrals and resources, Increase social support, Increase ability to appropriately verbalize feelings, Increase emotional regulation, Facilitate acceptance of mental health diagnosis and concerns, Facilitate patient progression through stages of change regarding substance use diagnoses and concerns, Identify triggers associated with mental health/substance abuse issues, and Increase skills for wellness and recovery  Therapeutic Interventions: Assess for all discharge needs, 1 to 1 time with Social worker, Explore available resources and support systems, Assess for adequacy in community support network, Educate family and significant other(s) on suicide prevention, Complete Psychosocial Assessment, Interpersonal group therapy.  Evaluation of Outcomes: Not Met   Progress in Treatment: Attending groups: Yes. Participating in groups: Yes. Taking medication as prescribed:  Yes. Toleration medication: Yes. Family/Significant other contact made: No, will contact:  CSW to contact once permission is granted.  Patient understands diagnosis: Yes. Discussing patient identified problems/goals with staff: Yes. Medical problems stabilized or resolved: Yes. and No. Denies suicidal/homicidal ideation: Yes. Issues/concerns per patient self-inventory: No. Other: None  New problem(s) identified: Yes, Describe:  Patient is navigating complicated grief after the recent passing of his wife in June.   New Short Term/Long Term Goal(s):detox, elimination of symptoms of psychosis, medication management for mood stabilization; elimination of SI thoughts; development of comprehensive mental wellness/sobriety plan.    Patient Goals:  "I just want to get out of here. I made up my mind to stop drinking and go to meetings."  Discharge Plan or Barriers: CSW to assist in the development of planning appropriate discharge plan.   Reason for Continuation of Hospitalization: Anxiety Depression Medication stabilization Suicidal ideation Withdrawal symptoms  Estimated Length of Stay:1-7 days.   Last 3 [place] Suicide Severity Risk Score: Flowsheet Row Admission (Current) from 03/09/2023 in [facility] INPATIENT BEHAVIORAL MEDICINE Most recent reading at 03/09/2023  5:00 PM ED from 03/09/2023 in [facility] Emergency Department at [facility] Most recent reading at 03/09/2023 11:12 AM ED from 02/06/2023 in [facility] Emergency Department at [facility] Most recent reading at 02/06/2023  7:49 PM  C-SSRS RISK CATEGORY Error: Q7 should not be populated when Q6 is No High Risk No Risk       Last PHQ 2/9 Scores:    10/29/2022   10:14 AM 01/20/2022   10:49 AM 01/20/2022   10:47 AM  Depression screen PHQ 2/9  Decreased Interest 1 0 0  Down, Depressed, Hopeless 0 0 0  PHQ - 2 Score 1 0 0    Scribe for Treatment Team: Lowry Ram, LCSW 03/10/2023 9:40 AM

## 2023-03-10 NOTE — Plan of Care (Signed)
Patient has not had time to progress. Problem: Education: Goal: Knowledge of [facility] Education information/materials will improve Outcome: Not Progressing   Problem: Coping: Goal: Ability to verbalize frustrations and anger appropriately will improve Outcome: Not Progressing   Problem: Health Behavior/Discharge Planning: Goal: Identification of resources available to assist in meeting health care needs will improve Outcome: Not Progressing   Problem: Activity: Goal: Interest or engagement in activities will improve Outcome: Not Progressing Goal: Sleeping patterns will improve Outcome: Not Progressing

## 2023-03-10 NOTE — [facility] Suicide Risk Assessment (Signed)
[facility] [address] Admission Suicide Risk Assessment   Nursing information obtained from:  Patient Demographic factors:  Caucasian, Living alone Current Mental Status:  NA Loss Factors:  NA Historical Factors:  NA Risk Reduction Factors:  NA  Total Time spent with patient: 1 hour Principal Problem: Major depressive disorder, recurrent severe without psychotic features (HCC) Diagnosis:  Principal Problem:   Major depressive disorder, recurrent severe without psychotic features (HCC)  Subjective Data: Alcohol use, depression and grief  Continued Clinical Symptoms:  Alcohol Use Disorder Identification Test Final Score (AUDIT): 22 The "Alcohol Use Disorders Identification Test", Guidelines for Use in Primary Care, Second Edition.  World Science writer [facility]). Score between 0-7:  no or low risk or alcohol related problems. Score between 8-15:  moderate risk of alcohol related problems. Score between 16-19:  high risk of alcohol related problems. Score 20 or above:  warrants further diagnostic evaluation for alcohol dependence and treatment.   CLINICAL FACTORS:   Severe Anxiety and/or Agitation Depression:   Comorbid alcohol abuse/dependence Chronic Pain   Musculoskeletal: Strength & Muscle Tone: within normal limits Gait & Station:  wheelchair bound Patient leans: N/A  Psychiatric Specialty Exam: Physical Exam Constitutional:      Appearance: Normal appearance. He is obese.  HENT:     Head: Normocephalic and atraumatic.  Pulmonary:     Effort: Pulmonary effort is normal.  Musculoskeletal:     Cervical back: Normal range of motion.  Neurological:     General: No focal deficit present.     Mental Status: He is alert and oriented to person, place, and time.  Psychiatric:        Thought Content: Thought content normal.        Judgment: Judgment normal.     Review of Systems  Psychiatric/Behavioral:  Positive for depression and substance abuse. The patient is nervous/anxious.      Blood pressure (!) 156/91, pulse 92, temperature 98.4 F (36.9 C), resp. rate 20, height 6\' 1"  (1.854 m), weight 117.9 kg, SpO2 94%.Body mass index is 34.3 kg/m.  General Appearance: Fairly Groomed  Eye Contact:  Good  Speech:  Normal Rate  Volume:  Normal  Mood:  Depressed anxious  Affect:  Depressed  Thought Process:  Coherent  Orientation:  Full (Time, Place, and Person)  Thought Content:  WDL  Suicidal Thoughts:  No  Homicidal Thoughts:  No  Memory:  Immediate;   Good Recent;   Good Remote;   Good  Judgement:  Good  Insight:  Good  Psychomotor Activity:  Normal  Concentration:  Concentration: Good and Attention Span: Good  Recall:  Good  Fund of Knowledge:  Good  Language:  Good  Akathisia:  Negative  Handed:  Right  AIMS (if indicated):     Assets:  Desire for Improvement Housing Resilience Social Support  ADL's:  Intact  Cognition:  WNL  Sleep:  Fair     Physical Exam: Physical Exam Constitutional:      Appearance: Normal appearance. He is obese.  HENT:     Head: Normocephalic and atraumatic.  Pulmonary:     Effort: Pulmonary effort is normal.  Musculoskeletal:     Cervical back: Normal range of motion.  Neurological:     General: No focal deficit present.     Mental Status: He is alert and oriented to person, place, and time.  Psychiatric:        Thought Content: Thought content normal.        Judgment: Judgment normal.  Review of Systems  Psychiatric/Behavioral:  Positive for depression and substance abuse. The patient is nervous/anxious.    Blood pressure (!) 156/91, pulse 92, temperature 98.4 F (36.9 C), resp. rate 20, height 6\' 1"  (1.854 m), weight 117.9 kg, SpO2 94%. Body mass index is 34.3 kg/m.   COGNITIVE FEATURES THAT CONTRIBUTE TO RISK:  None    SUICIDE RISK:   Minimal: No identifiable suicidal ideation.  Patients presenting with no risk factors but with morbid ruminations; may be classified as minimal risk based on the severity  of the depressive symptoms  PLAN OF CARE:  Major depressive disorder, recurrent, severe without psychsis: Wellbutrin 300 mg daily Cymbalta 60 mg daily Risperdal 0.5 mg BID  Alcohol Withdrawal:  Ativan detox protocol  Anxiety: Hydroxyzine every 6 hours PRN  Insomnia: Ambien 10 mg daily at bedtime Trazodone 100 mg daily at bedtime PRN   I certify that inpatient services furnished can reasonably be expected to improve the patient's condition.   Nanine Means, NP 03/10/2023, 6:59 AM

## 2023-03-10 NOTE — Group Note (Signed)
Date:  03/10/2023 Time:  11:24 AM  Group Topic/Focus:  Goals Group:   The focus of this group is to help patients establish daily goals to achieve during treatment and discuss how the patient can incorporate goal setting into their daily lives to aide in recovery.   Participation Level:  Did Not Attend   Charles [name] 03/10/2023, 11:24 AM

## 2023-03-10 NOTE — Plan of Care (Signed)
  Problem: Education: Goal: Mental status will improve Outcome: Progressing   Problem: Activity: Goal: Interest or engagement in activities will improve Outcome: Progressing   Problem: Safety: Goal: Periods of time without injury will increase Outcome: Progressing

## 2023-03-10 NOTE — Progress Notes (Signed)
.  D- Patient alert and oriented. Denies SI, HI, AVH, and pain. Gabapentin, vistaril and trazodone.  A- Scheduled medications administered to patient, per MD orders. Support and encouragement provided. Routine safety checks conducted every 15 minutes.  Patient informed to notify staff with problems or concerns. R- No adverse drug reactions noted. Patient contracts for safety at this time. Patient compliant with medications and treatment plan. Patient receptive, calm, and cooperative. Patient interacts well with others on the unit.  Patient remains safe at this time.   03/10/23 0600  Psych Admission Type (Psych Patients Only)  Admission Status Voluntary  Psychosocial Assessment  Patient Complaints Anxiety;Depression  Eye Contact Brief  Facial Expression Flat  Affect Flat  Speech Soft  Interaction Minimal  Motor Activity Slow  Appearance/Hygiene Body odor  Behavior Characteristics Cooperative;Appropriate to situation  Mood Sad  Thought Process  Coherency WDL  Content WDL  Delusions None reported or observed  Perception WDL  Hallucination None reported or observed  Judgment WDL  Confusion WDL  Danger to Self  Current suicidal ideation? Denies  Danger to Others  Danger to Others None reported or observed

## 2023-03-10 NOTE — Plan of Care (Signed)
Patient pleasant and cooperative on approach. Patient stated that Ativan helps with his anxiety. Patient asking for his home medications. Made providers aware of it. Patient visible in the milieu. Appropriate with staff & peers. Denies SI,HI and AVH. Denies any symptoms of withdrawals. Appetite and energy level good. Support and encouragement given.

## 2023-03-10 NOTE — Group Note (Signed)
Date:  03/11/2023 Time:  12:01 AM  Group Topic/Focus:  Wrap-Up Group:   The focus of this group is to help patients review their daily goal of treatment and discuss progress on daily workbooks.    Participation Level:  Active  Participation Quality:  Appropriate  Affect:  Appropriate  Cognitive:  Appropriate  Insight: Appropriate  Engagement in Group:  Engaged  Modes of Intervention:  Discussion  Lenore Cordia 03/11/2023, 12:01 AM
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
## 2023-03-10 NOTE — Progress Notes (Signed)
Patient is pleasant and cooperative. Appropriate with staff and peers. Denies SI, HI, AVH. Medication compliant. Noted in dayroom playing games with staff and peers. No complaints or concerns voiced other than wanting his regularly scheduled meds. Independent with wheelchair. Left AKA. Encouragement and support provided. Safety checks maintained. Medications given as prescribed. Pt receptive and remains safe on unit with q 15 min checks.

## 2023-03-10 NOTE — [facility] Suicide Risk Assessment (Signed)
[facility] INPATIENT:  Family/Significant Other Suicide Prevention Education  Suicide Prevention Education:  Education Completed; Jonna Clark, sister, [phone]  has been identified by the patient as the family member/significant other with whom the patient will be residing, and identified as the person(s) who will aid the patient in the event of a mental health crisis (suicidal ideations/suicide attempt).  With written consent from the patient, the family member/significant other has been provided the following suicide prevention education, prior to the and/or following the discharge of the patient.  The suicide prevention education provided includes the following: Suicide risk factors Suicide prevention and interventions National Suicide Hotline telephone number [facility] assessment telephone number [facility] Emergency Assistance 911 [facility] and/or Residential Mobile Crisis Unit telephone number  Request made of family/significant other to: Remove weapons (e.g., guns, rifles, knives), all items previously/currently identified as safety concern.   Remove drugs/medications (over-the-counter, prescriptions, illicit drugs), all items previously/currently identified as a safety concern.  The family member/significant other verbalizes understanding of the suicide prevention education information provided.  The family member/significant other agrees to remove the items of safety concern listed above.  Lowry Ram 03/10/2023, 3:12 PM

## 2023-03-10 NOTE — Clinical Social Work Note (Signed)
CSW conducted SPE with patient's sister Jonna Clark [phone] with patient's consent.   During conversation, sister explained that she currently did not want to speak to the patient as she is "exhausted" with his habits." Sister explained that the patient "is a drunk" and that herself and her siblings "are out of money to give him." Sister explained that the patient's siblings have all been supporting the patient and that he has "taken advantage."  The sister explained that the patient is currently $6,500 behind in mortgage payments due to "spending his money to drink."  The sister added that the patient's disability income is enough to cover his bills and mortgage but that the patient "doesn't manage" his finances appropriately.    The sister reported that the patient has a rental property that he is renting out that he can live in if needed. The sister reported that the property is completely paid for and if the patient were to need a place to live, he could stay there. The sister explained that the patient would need to communicate to the current tenant a move out date.   The sister plans to communicate with the family and would like for the patient's daughter to talk with him more about moving into the rental property as it's more stable housing. CSW assessed for any immediate needs. There are none at the moment.   CSW will continue to assess and evaluate.   Reymundo Poll, MSW, LCSWA 03/10/2023 3:24 PM

## 2023-03-10 NOTE — H&P (Signed)
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 Wellbutrin 300 mg daily Cymbalta 60 mg daily Risperdal 0.5 mg BID   Alcohol Withdrawal:  Ativan detox protocol   Anxiety: Hydroxyzine every 6 hours PRN   Insomnia: Ambien 10 mg daily at bedtime Trazodone 100 mg daily at bedtime PRN    Observation Level/Precautions:  15 minute checks  Laboratory:  Completed reviewed, stable  Psychotherapy:  individual and group therapy  Medications:  See above  Consultations:  None  Discharge Concerns:  None  Estimated LOS:  5-7 days  Other:     Physician Treatment Plan for Primary Diagnosis: Major depressive disorder, recurrent severe without psychotic features (HCC) Long Term Goal(s):  Improvement in symptoms so as ready for discharge  Short Term Goals: Ability to identify changes in lifestyle to reduce recurrence of condition will improve, Ability to verbalize feelings will improve, Ability to disclose and discuss suicidal ideas, Ability to demonstrate self-control will improve, Ability to identify and develop effective coping behaviors will improve, Ability to maintain clinical measurements  within normal limits will improve, Compliance with prescribed medications will improve, and Ability to identify triggers associated with substance abuse/mental health issues will improve  Physician Treatment Plan for Secondary Diagnosis: Principal Problem:   Major depressive disorder, recurrent severe without psychotic features (HCC)  Long Term Goal(s): Improvement in symptoms so as ready for discharge  Short Term Goals: Ability to identify changes in lifestyle to reduce recurrence of condition will improve, Ability to verbalize feelings will improve, Ability to disclose and discuss suicidal ideas, Ability to demonstrate self-control will improve, Ability to identify and develop effective coping behaviors will improve, Ability to maintain clinical measurements within normal limits will improve, Compliance with prescribed medications will improve, and Ability to identify triggers associated with substance abuse/mental health issues will improve  I certify that inpatient services furnished can reasonably be expected to improve the patient's condition.    Nanine Means, NP 11/20/20246:59 AM

## 2023-03-10 NOTE — Group Note (Signed)
Recreation Therapy Group Note   Group Topic:Emotion Expression  Group Date: 03/10/2023 Start Time: 1000 End Time: 1100 Facilitators: Rosina Lowenstein, LRT, CTRS Location:  [facility]  Group Description: Positivity Collage. LRT and patients discussed the importance of having a positive mindset and being happy. Patients received magazines, safety scissors, a glue stick and a piece of paper. Pts were encouraged to find images or words in the magazines that showed "happiness" or positivity to them. Pt shared their collage with the group once they were finished. LRT and pts discussed how it can be difficult to always have a positive mindset, especially when they have mental health challenges.   Goal Area(s) Addressed:  Pt will identify things associate with positivity. Pt will reduce negative thinking. Pt will identify a new coping skill of thinking positive thoughts.    Affect/Mood: Appropriate and Blunted   Participation Level: Active and Engaged   Participation Quality: Independent   Behavior: Appropriate, Calm, and Cooperative   Speech/Thought Process: Coherent   Insight: Fair   Judgement: Fair    Modes of Intervention: Art and Exploration   Patient Response to Interventions:  Engaged and Receptive   Education Outcome:  Acknowledges education   Clinical Observations/Individualized Feedback: Charles [name] was active in their participation of session activities and group discussion. Pt identified "I put food on mine because I like to eat but need to be more healthy about it". Pt appropriately identified this through images selected. Pt interacted well with LRT and peers duration of session.    Plan: Continue to engage patient in RT group sessions 2-3x/week.   Rosina Lowenstein, LRT, CTRS 03/10/2023 11:56 AM

## 2023-03-11 DIAGNOSIS — F332 Major depressive disorder, recurrent severe without psychotic features: Secondary | ICD-10-CM | POA: Diagnosis not present

## 2023-03-11 LAB — LIPID PANEL
Cholesterol: 200 mg/dL (ref 0–200)
HDL: 76 mg/dL (ref >40)
LDL Cholesterol: 102 mg/dL — ABNORMAL HIGH (ref 0–99)
Total CHOL/HDL Ratio: 2.6 {ratio}
Triglycerides: 110 mg/dL (ref <150)
VLDL: 22 mg/dL (ref 0–40)

## 2023-03-11 LAB — TSH: TSH: 3.315 u[IU]/mL (ref 0.350–4.500)

## 2023-03-11 LAB — HEMOGLOBIN A1C
Hgb A1c MFr Bld: 5.1 % (ref 4.8–5.6)
Mean Plasma Glucose: 99.67 mg/dL

## 2023-03-11 NOTE — Group Note (Signed)
Recreation Therapy Group Note   Group Topic:Relaxation  Group Date: 03/11/2023 Start Time: 1000 End Time: 1050 Facilitators: Rosina Lowenstein, LRT, CTRS Location:  [facility]  Group Description: PMR (Progressive Muscle Relaxation). LRT asks patients their current level of stress/anxiety from 1-10, with 10 being the highest. LRT educates patients on what PMR is and the benefits that come from it. Patients are asked to sit with their feet flat on the floor while sitting up and all the way back in their chair, if possible. LRT and pts follow a prompt through a speaker that requires you to tense and release different muscles in their body and focus on their breathing. During session, lights are off and soft music is being played. Pts are given a stress ball to use if needed. At the end of the prompt, LRT asks patients to rank their current levels of stress/anxiety from 1-10, 10 being the highest. LRT provides patients with an education handout on PMR.   Goal Area(s) Addressed:  Patients will be able to describe progressive muscle relaxation.  Patient will practice using relaxation technique. Patient will identify a new coping skill.  Patient will follow multistep directions to reduce anxiety and stress.   Affect/Mood: Appropriate   Participation Level: Active and Engaged   Participation Quality: Independent   Behavior: Calm and Cooperative   Speech/Thought Process: Coherent   Insight: Good   Judgement: Good   Modes of Intervention: Education and Exploration   Patient Response to Interventions:  Attentive, Engaged, and Requested additional information/resources    Education Outcome:  Acknowledges education   Clinical Observations/Individualized Feedback: Lorin Picket was active in their participation of session activities and group discussion. Pt identified that his anxiety and stress were both a 6 before the session. After, he rated his anxiety a 5 and stress a 6. Pt asked and received a  stress ball. Pt interacted well with LRT and peers duration of session.    Plan: Continue to engage patient in RT group sessions 2-3x/week.   Rosina Lowenstein, LRT, CTRS 03/11/2023 11:33 AM

## 2023-03-11 NOTE — Group Note (Signed)
Date:  03/11/2023 Time:  4:29 PM  Group Topic/Focus:  Activity Group:  The focus of the group is to promote activity outside in the courtyard to get some fresh air and some exercise.    Participation Level:  Did Not Attend  Charles [name] 03/11/2023, 4:29 PM

## 2023-03-11 NOTE — Plan of Care (Signed)
  Problem: Education: Goal: Mental status will improve Outcome: Progressing   Problem: Activity: Goal: Interest or engagement in activities will improve Outcome: Progressing Goal: Sleeping patterns will improve Outcome: Progressing   Problem: Safety: Goal: Periods of time without injury will increase Outcome: Progressing   Problem: Health Behavior/Discharge Planning: Goal: Compliance with treatment plan for underlying cause of condition will improve Outcome: Progressing

## 2023-03-11 NOTE — Progress Notes (Signed)
Patient denies SI,HI, and A/V/H with no plan or intent. Patient demonstrates adequate appetite and socializing appropriately on unit. Patient is med compliant and remains free from any injuries. Patient states his goal for today is "getting my plan of action together so I can be home asap."      03/11/23 0808  Psych Admission Type (Psych Patients Only)  Admission Status Voluntary  Psychosocial Assessment  Patient Complaints Anxiety  Eye Contact Fair  Facial Expression Animated  Affect Appropriate to circumstance  Speech Logical/coherent  Interaction Assertive  Motor Activity Slow  Appearance/Hygiene Unremarkable  Behavior Characteristics Cooperative;Appropriate to situation  Mood Pleasant  Thought Process  Coherency WDL  Content WDL  Delusions None reported or observed  Perception WDL  Hallucination None reported or observed  Judgment Impaired  Confusion None  Danger to Self  Current suicidal ideation? Denies  Danger to Others  Danger to Others None reported or observed

## 2023-03-11 NOTE — Group Note (Signed)
Date:  03/11/2023 Time:  9:39 PM  Group Topic/Focus:  Orientation:   The focus of this group is to educate the patient on the purpose and policies of crisis stabilization and provide a format to answer questions about their admission.  The group details unit policies and expectations of patients while admitted.    Participation Level:  Active  Participation Quality:  Appropriate and Attentive  Affect:  Appropriate  Cognitive:  Alert and Appropriate  Insight: Appropriate  Engagement in Group:  Developing/Improving  Modes of Intervention:  Clarification, Education, Orientation, and Support  Additional Comments:     Tenessa Marsee 03/11/2023, 9:39 PM
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
## 2023-03-12 ENCOUNTER — Telehealth: Payer: Self-pay | Admitting: Physician Assistant

## 2023-03-12 DIAGNOSIS — F332 Major depressive disorder, recurrent severe without psychotic features: Secondary | ICD-10-CM | POA: Diagnosis not present

## 2023-03-12 NOTE — Group Note (Signed)
Recreation Therapy Group Note   Group Topic:Leisure Education  Group Date: 03/12/2023 Start Time: 1000 End Time: 1100 Facilitators: Rosina Lowenstein, LRT, CTRS Location:  Craft Room  Group Description: Leisure. Patients were given the option to choose from singing karaoke, coloring mandalas, using oil pastels, journaling, or playing with play-doh. LRT and pts discussed the meaning of leisure, the importance of participating in leisure during their free time/when they're outside of the hospital, as well as how our leisure interests can also serve as coping skills.   Goal Area(s) Addressed:  Patient will identify a current leisure interest.  Patient will learn the definition of "leisure". Patient will practice making a positive decision. Patient will have the opportunity to try a new leisure activity. Patient will communicate with peers and LRT.    Affect/Mood: Appropriate   Participation Level: Active and Engaged   Participation Quality: Independent   Behavior: Appropriate, Calm, and Cooperative   Speech/Thought Process: Coherent   Insight: Fair   Judgement: Good   Modes of Intervention: Activity, Education, and Exploration   Patient Response to Interventions:  Attentive and Receptive   Education Outcome:  Acknowledges education   Clinical Observations/Individualized Feedback: Lorin Picket was active in their participation of session activities and group discussion. Pt identified "well I am limited on what I can do but I watch TV and play with my dog. My dog is the most important thing to me". Pt chose to color while in group. Pt interacted well with LRT and peers duration of session.    Plan: Continue to engage patient in RT group sessions 2-3x/week.   Rosina Lowenstein, LRT, CTRS 03/12/2023 11:44 AM

## 2023-03-12 NOTE — Group Note (Signed)
Date:  03/12/2023 Time:  10:58 PM  Group Topic/Focus:  Wrap-Up Group:   The focus of this group is to help patients review their daily goal of treatment and discuss progress on daily workbooks.    Participation Level:  Active  Participation Quality:  Appropriate  Affect:  Appropriate  Cognitive:  Appropriate  Insight: Appropriate  Engagement in Group:  Engaged  Modes of Intervention:  Discussion    Charles [name] 03/12/2023, 10:58 PM

## 2023-03-12 NOTE — Group Note (Signed)
Date:  03/12/2023 Time:  6:46 PM  Group Topic/Focus:  Activity Group:  The focus of the group is to promote activity for the patients to receive some fresh air and get some exercise out in the courtyard.    Participation Level:  Did Not Attend   Charles [name] 03/12/2023, 6:46 PM

## 2023-03-12 NOTE — Plan of Care (Signed)
  Problem: Education: Goal: Emotional status will improve Outcome: Progressing Goal: Mental status will improve Outcome: Progressing  Patient compliant with medications visible in Milieu. Interacting well with Games developer. Support and encouragement provided.

## 2023-03-12 NOTE — Telephone Encounter (Signed)
error 
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
## 2023-03-12 NOTE — Progress Notes (Signed)
   03/12/23 0900  Psych Admission Type (Psych Patients Only)  Admission Status Involuntary  Psychosocial Assessment  Patient Complaints Depression;Substance abuse  Eye Contact Fair  Facial Expression Animated  Affect Flat  Speech Logical/coherent  Interaction Assertive  Motor Activity Slow  Appearance/Hygiene In scrubs  Behavior Characteristics Cooperative;Appropriate to situation  Mood Depressed;Preoccupied  Thought Process  Coherency WDL  Content WDL  Delusions None reported or observed  Perception WDL  Hallucination None reported or observed  Judgment Impaired  Confusion None  Danger to Self  Current suicidal ideation? Denies  Agreement Not to Harm Self Yes  Description of Agreement verbal  Danger to Others  Danger to Others None reported or observed

## 2023-03-13 DIAGNOSIS — F332 Major depressive disorder, recurrent severe without psychotic features: Secondary | ICD-10-CM | POA: Diagnosis not present

## 2023-03-13 MED ORDER — DULOXETINE HCL 60 MG PO CPEP: 60.0000 mg | ORAL_CAPSULE | Freq: Every day | ORAL | 0 refills | Status: DC

## 2023-03-13 MED ORDER — RISPERIDONE 0.5 MG PO TABS: 0.5000 mg | ORAL_TABLET | ORAL | 0 refills | Status: DC

## 2023-03-13 MED ORDER — BUPROPION HCL ER (XL) 300 MG PO TB24: 300.0000 mg | ORAL_TABLET | Freq: Every day | ORAL | 0 refills | Status: DC

## 2023-03-13 MED ORDER — HYDROXYZINE HCL 25 MG PO TABS: 25.0000 mg | ORAL_TABLET | Freq: Three times a day (TID) | ORAL | 0 refills | Status: AC | PRN
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
## 2023-03-13 NOTE — Progress Notes (Signed)
Discharge Note:  Patient denies SI/HI/AVH at this time. Discharge instructions, AVS  and transitions record gone over with patient. Patient agrees to comply with medication management, follow-up visit, and outpatient therapy. Patient belongings returned to patient. Patient questions and concerns addressed and answered. Patient was transported by wheelchair off unit and transferred to car. Patient discharged to home with caregiver.

## 2023-03-13 NOTE — [facility] Suicide Risk Assessment (Signed)
[facility] Discharge Suicide Risk Assessment   Principal Problem: Major depressive disorder, recurrent severe without psychotic features [facility]) Discharge Diagnoses: Principal Problem:   Major depressive disorder, recurrent severe without psychotic features (HCC)   Total Time spent with patient: 45 minutes  Musculoskeletal: Strength & Muscle Tone: within normal limits Gait & Station: normal Patient leans: N/A  Psychiatric Specialty Exam: Physical Exam Vitals and nursing note reviewed.  Constitutional:      Appearance: Normal appearance.  HENT:     Head: Normocephalic.     Nose: Nose normal.  Pulmonary:     Effort: Pulmonary effort is normal.  Musculoskeletal:        General: Normal range of motion.     Cervical back: Normal range of motion.  Neurological:     General: No focal deficit present.     Mental Status: He is alert and oriented to person, place, and time.     Review of Systems  Psychiatric/Behavioral:  Positive for substance abuse. The patient is nervous/anxious.   All other systems reviewed and are negative.   Blood pressure 126/72, pulse 71, temperature 98.2 F (36.8 C), temperature source Oral, resp. rate 20, height 6\' 1"  (1.854 m), weight 117.9 kg, SpO2 95%.Body mass index is 34.3 kg/m.  General Appearance: Casual  Eye Contact:  Good  Speech:  Normal Rate  Volume:  Normal  Mood:  Anxious  Affect:  Congruent  Thought Process:  Coherent and Descriptions of Associations: Intact  Orientation:  Full (Time, Place, and Person)  Thought Content:  WDL and Logical  Suicidal Thoughts:  No  Homicidal Thoughts:  No  Memory:  Immediate;   Good Recent;   Good Remote;   Good  Judgement:  Fair  Insight:  Fair  Psychomotor Activity:  Normal  Concentration:  Concentration: Good and Attention Span: Good  Recall:  Good  Fund of Knowledge:  Good  Language:  Good  Akathisia:  No  Handed:  Right  AIMS (if indicated):     Assets:  Housing Leisure Time Physical  Health Resilience Social Support  ADL's:  Intact  Cognition:  WNL  Sleep:        Physical Exam: Physical Exam Vitals and nursing note reviewed.  Constitutional:      Appearance: Normal appearance.  HENT:     Head: Normocephalic.     Nose: Nose normal.  Pulmonary:     Effort: Pulmonary effort is normal.  Musculoskeletal:        General: Normal range of motion.     Cervical back: Normal range of motion.  Neurological:     General: No focal deficit present.     Mental Status: He is alert and oriented to person, place, and time.    Review of Systems  Psychiatric/Behavioral:  Positive for substance abuse. The patient is nervous/anxious.   All other systems reviewed and are negative.  Blood pressure 126/72, pulse 71, temperature 98.2 F (36.8 C), temperature source Oral, resp. rate 20, height 6\' 1"  (1.854 m), weight 117.9 kg, SpO2 95%. Body mass index is 34.3 kg/m.  Mental Status Per Nursing Assessment::   On Admission:  NA  Demographic Factors:  Male, Divorced or widowed, Caucasian, and Living alone  Loss Factors: NA  Historical Factors: NA  Risk Reduction Factors:   Sense of responsibility to family, Positive social support, Positive therapeutic relationship, and Positive coping skills or problem solving skills  Continued Clinical Symptoms:  Anxiety, mild  Cognitive Features That Contribute To Risk:  None  Suicide Risk:  Minimal: No identifiable suicidal ideation.  Patients presenting with no risk factors but with morbid ruminations; may be classified as minimal risk based on the severity of the depressive symptoms   Follow-up Information     [facility]. Go to.   Why: Therapy appointment scheduled for 03/23/23 at 8am via phone. You will recieve a call at [phone].  Please call adn cancel if you do not wish to keep this appointment. Contact information: [address]  [address] [place] [address] [phone]         [facility] Follow up.   Why: Walk in hours are from 8AM to 2PM, Monday, Wednesday and Friday. Contact information: [address] [place] [address] [phone]                 Plan Of Care/Follow-up recommendations:  Activity:  as tolerated Diet:  heart healthy diet Major depressive disorder, recurrent, severe without psychsis: Wellbutrin 300 mg daily Cymbalta 60 mg daily Risperdal 0.5 mg BID lipid panel WDL except LDLs of 102 H, N2T 5.1, TSH 3.315   Alcohol Withdrawal:  Ativan detox protocol   Anxiety: Hydroxyzine every 6 hours PRN   Insomnia: Ambien 10 mg daily at bedtime Trazodone 100 mg daily at bedtime PRN  Nanine Means, NP 03/13/2023, 8:58 AM

## 2023-03-13 NOTE — Progress Notes (Signed)
  [facility] Adult Case Management Discharge Plan :  Will you be returning to the same living situation after discharge:  Yes,  [address] [place] At discharge, do you have transportation home?: No. Taxi voucher will be provided. Do you have the ability to pay for your medications: Yes,  [facility] plan  Release of information consent forms completed and in the chart;  Patient's signature needed at discharge.  Patient to Follow up at:  Follow-up Information     [facility]. Go to.   Why: Therapy appointment scheduled for 03/23/23 at 8am via phone. You will recieve a call at [phone].  Please call adn cancel if you do not wish to keep this appointment. Contact information: [address]  [address] [place] [address] [phone]         [facility] Follow up.   Why: Walk in hours are from 8AM to 2PM, Monday, Wednesday and Friday. Contact information: [address] [place] [address] [phone]                 Next level of care provider has access to [facility] Link:yes  Safety Planning and Suicide Prevention discussed: Yes,   completed with Jonna Clark, sister, [phone]      Has patient been referred to the Quitline?: Patient does not use tobacco/nicotine products  Patient has been referred for addiction treatment: Yes, the patient will follow up with an outpatient provider for substance use disorder. Therapist: appointment made  Marshell Levan, LCSW 03/13/2023, 9:04 AM

## 2023-03-13 NOTE — Progress Notes (Signed)
   03/12/23 2000  Psych Admission Type (Psych Patients Only)  Admission Status Involuntary  Psychosocial Assessment  Patient Complaints Depression  Eye Contact Fair  Facial Expression Animated  Affect Flat  Speech Logical/coherent  Interaction Assertive  Motor Activity Slow  Appearance/Hygiene In scrubs  Behavior Characteristics Cooperative;Appropriate to situation  Mood Depressed;Preoccupied  Thought Process  Coherency WDL  Content WDL  Delusions None reported or observed  Perception WDL  Hallucination None reported or observed  Judgment Impaired  Confusion None  Danger to Self  Current suicidal ideation? Denies  Agreement Not to Harm Self Yes  Description of Agreement verbal  Danger to Others  Danger to Others None reported or observed   Patient alert and oriented x 4, affect is blunted thoughts are organized, 15 minutes safety checks maintained will continue to monitor .

## 2023-03-13 NOTE — Plan of Care (Signed)
  Problem: Education: Goal: Knowledge of [facility] Education information/materials will improve Outcome: Adequate for Discharge Goal: Emotional status will improve Outcome: Adequate for Discharge Goal: Mental status will improve Outcome: Adequate for Discharge Goal: Verbalization of understanding the information provided will improve Outcome: Adequate for Discharge   Problem: Activity: Goal: Interest or engagement in activities will improve Outcome: Adequate for Discharge Goal: Sleeping patterns will improve Outcome: Adequate for Discharge   Problem: Coping: Goal: Ability to verbalize frustrations and anger appropriately will improve Outcome: Adequate for Discharge Goal: Ability to demonstrate self-control will improve Outcome: Adequate for Discharge   Problem: Health Behavior/Discharge Planning: Goal: Identification of resources available to assist in meeting health care needs will improve Outcome: Adequate for Discharge Goal: Compliance with treatment plan for underlying cause of condition will improve Outcome: Adequate for Discharge   Problem: Physical Regulation: Goal: Ability to maintain clinical measurements within normal limits will improve Outcome: Adequate for Discharge   Problem: Safety: Goal: Periods of time without injury will increase Outcome: Adequate for Discharge   

## 2023-03-13 NOTE — Group Note (Signed)
Date:  03/13/2023 Time:  11:49 AM  Group Topic/Focus:  Goals Group:   The focus of this group is to help patients establish daily goals to achieve during treatment and discuss how the patient can incorporate goal setting into their daily lives to aide in recovery. Making Healthy Choices:   The focus of this group is to help patients identify negative/unhealthy choices they were using prior to admission and identify positive/healthier coping strategies to replace them upon discharge.    Participation Level:  Active  Participation Quality:  Appropriate  Affect:  Appropriate  Cognitive:  Appropriate  Insight: Appropriate  Engagement in Group:  Engaged  Modes of Intervention:  Discussion, Education, and Support  Additional Comments:    Wilford Corner 03/13/2023, 11:49 AM

## 2023-03-15 ENCOUNTER — Other Ambulatory Visit: Payer: Self-pay | Admitting: Physician Assistant

## 2023-03-15 DIAGNOSIS — G4719 Other hypersomnia: Secondary | ICD-10-CM

## 2023-03-30 ENCOUNTER — Other Ambulatory Visit: Payer: Self-pay | Admitting: Physician Assistant

## 2023-03-30 ENCOUNTER — Other Ambulatory Visit (HOSPITAL_COMMUNITY): Payer: Self-pay | Admitting: Psychiatry

## 2023-03-30 NOTE — Telephone Encounter (Signed)
Ok to send

## 2023-03-31 NOTE — Telephone Encounter (Signed)
Please review

## 2023-04-08 ENCOUNTER — Ambulatory Visit: Payer: MEDICAID | Admitting: Physician Assistant

## 2023-04-27 ENCOUNTER — Other Ambulatory Visit: Payer: Self-pay | Admitting: Physician Assistant

## 2023-04-27 DIAGNOSIS — G4719 Other hypersomnia: Secondary | ICD-10-CM

## 2023-04-27 NOTE — Telephone Encounter (Signed)
 Please review

## 2023-04-29 ENCOUNTER — Telehealth: Payer: Self-pay | Admitting: Physician Assistant

## 2023-04-29 ENCOUNTER — Other Ambulatory Visit: Payer: Self-pay | Admitting: Physician Assistant

## 2023-04-29 NOTE — Telephone Encounter (Signed)
 Sent community message to Maralyn Sago with [facility], that patient no longer in hospital to see if she can get overnight pulseOx to him now-Toni

## 2023-04-30 ENCOUNTER — Ambulatory Visit: Payer: MEDICAID | Admitting: Cardiovascular Disease

## 2023-05-07 ENCOUNTER — Other Ambulatory Visit: Payer: Self-pay | Admitting: Physician Assistant

## 2023-05-07 DIAGNOSIS — G4719 Other hypersomnia: Secondary | ICD-10-CM

## 2023-05-07 NOTE — Telephone Encounter (Signed)
Please review

## 2023-05-13 ENCOUNTER — Ambulatory Visit: Payer: MEDICAID | Admitting: Physician Assistant

## 2023-05-13 VITALS — Ht 73.0 in | Wt 260.0 lb

## 2023-05-13 DIAGNOSIS — Z91199 Patient's noncompliance with other medical treatment and regimen due to unspecified reason: Secondary | ICD-10-CM | POA: Diagnosis not present

## 2023-05-13 DIAGNOSIS — R262 Difficulty in walking, not elsewhere classified: Secondary | ICD-10-CM

## 2023-05-13 DIAGNOSIS — F329 Major depressive disorder, single episode, unspecified: Secondary | ICD-10-CM | POA: Diagnosis not present
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
## 2023-05-24 ENCOUNTER — Other Ambulatory Visit: Payer: Self-pay | Admitting: Physician Assistant

## 2023-05-24 DIAGNOSIS — G4719 Other hypersomnia: Secondary | ICD-10-CM

## 2023-05-24 NOTE — Telephone Encounter (Signed)
 Please review

## 2023-06-08 ENCOUNTER — Other Ambulatory Visit: Payer: Self-pay | Admitting: Internal Medicine

## 2023-06-08 DIAGNOSIS — G4719 Other hypersomnia: Secondary | ICD-10-CM

## 2023-06-29 ENCOUNTER — Other Ambulatory Visit: Payer: Self-pay | Admitting: Physician Assistant

## 2023-07-13 ENCOUNTER — Other Ambulatory Visit: Payer: Self-pay | Admitting: Physician Assistant

## 2023-07-13 DIAGNOSIS — G4719 Other hypersomnia: Secondary | ICD-10-CM

## 2023-07-31 ENCOUNTER — Other Ambulatory Visit: Payer: Self-pay

## 2023-07-31 ENCOUNTER — Emergency Department
Admission: EM | Admit: 2023-07-31 | Discharge: 2023-07-31 | Disposition: A | Discharge status: Home / Self Care | Payer: MEDICAID | Attending: Emergency Medicine | Admitting: Emergency Medicine

## 2023-07-31 ENCOUNTER — Encounter: Payer: Self-pay | Admitting: Intensive Care

## 2023-07-31 ENCOUNTER — Emergency Department: Payer: MEDICAID

## 2023-07-31 DIAGNOSIS — R112 Nausea with vomiting, unspecified: Secondary | ICD-10-CM | POA: Insufficient documentation

## 2023-07-31 DIAGNOSIS — R079 Chest pain, unspecified: Secondary | ICD-10-CM | POA: Insufficient documentation

## 2023-07-31 LAB — URINALYSIS, ROUTINE W REFLEX MICROSCOPIC
Bilirubin Urine: NEGATIVE
Glucose, UA: NEGATIVE mg/dL
Hgb urine dipstick: NEGATIVE
Ketones, ur: NEGATIVE mg/dL
Leukocytes,Ua: NEGATIVE
Nitrite: NEGATIVE
Protein, ur: NEGATIVE mg/dL
Specific Gravity, Urine: 1.003 — ABNORMAL LOW (ref 1.005–1.030)
pH: 5 (ref 5.0–8.0)

## 2023-07-31 LAB — CBC
HCT: 36.9 % — ABNORMAL LOW (ref 39.0–52.0)
Hemoglobin: 11.8 g/dL — ABNORMAL LOW (ref 13.0–17.0)
MCH: 30.3 pg (ref 26.0–34.0)
MCHC: 32 g/dL (ref 30.0–36.0)
MCV: 94.6 fL (ref 80.0–100.0)
Platelets: 340 10*3/uL (ref 150–400)
RBC: 3.9 MIL/uL — ABNORMAL LOW (ref 4.22–5.81)
RDW: 18.6 % — ABNORMAL HIGH (ref 11.5–15.5)
WBC: 4.8 10*3/uL (ref 4.0–10.5)
nRBC: 0 % (ref 0.0–0.2)

## 2023-07-31 LAB — TROPONIN I (HIGH SENSITIVITY): Troponin I (High Sensitivity): 8 ng/L (ref <18)

## 2023-07-31 LAB — BASIC METABOLIC PANEL WITH GFR
Anion gap: 13 (ref 5–15)
BUN: 11 mg/dL (ref 8–23)
CO2: 21 mmol/L — ABNORMAL LOW (ref 22–32)
Calcium: 8.3 mg/dL — ABNORMAL LOW (ref 8.9–10.3)
Chloride: 107 mmol/L (ref 98–111)
Creatinine, Ser: 0.83 mg/dL (ref 0.61–1.24)
GFR, Estimated: >60 mL/min (ref >60)
Glucose, Bld: 101 mg/dL — ABNORMAL HIGH (ref 70–99)
Potassium: 3.3 mmol/L — ABNORMAL LOW (ref 3.5–5.1)
Sodium: 141 mmol/L (ref 135–145)
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
## 2023-07-31 NOTE — ED Provider Notes (Signed)
 [facility] Provider Note    Event Date/Time   First MD Initiated Contact with Patient 07/31/23 1733     (approximate)   History   Chest Pain   HPI  Charles [name] is a 63 y.o. male who presents to the emergency department today because of concerns for chest pain.  Located in his left lower chest.  It was sharp.  Occurred today.  Lasted for about 30 minutes.  Accompanied by nausea and vomiting.  States that he did fall when he had the chest pain but denies any pain or injuries from the fall. At the time my exam patient no longer has the pain.  Denies any recent illness.  No fevers or chills.  No shortness of breath.      Physical Exam   Triage Vital Signs: ED Triage Vitals  Encounter Vitals Group     BP 07/31/23 1715 131/76     Systolic BP Percentile --      Diastolic BP Percentile --      Pulse Rate 07/31/23 1715 80     Resp 07/31/23 1715 18     Temp 07/31/23 1715 98.1 F (36.7 C)     Temp Source 07/31/23 1715 Oral     SpO2 07/31/23 1715 97 %     Weight 07/31/23 1716 259 lb 14.8 oz (117.9 kg)     Height 07/31/23 1716 6\' 1"  (1.854 m)     Head Circumference --      Peak Flow --      Pain Score 07/31/23 1716 6     Pain Loc --      Pain Education --      Exclude from Growth Chart --     Most recent vital signs: Vitals:   07/31/23 1715  BP: 131/76  Pulse: 80  Resp: 18  Temp: 98.1 F (36.7 C)  SpO2: 97%   General: Awake, alert, oriented. CV:  Good peripheral perfusion. Regular rate and rhythm. Resp:  Normal effort. Lungs clear. Abd:  No distention. Non tender. Other:  Left AKA   ED Results / Procedures / Treatments   Labs (all labs ordered are listed, but only abnormal results are displayed) Labs Reviewed  CBC - Abnormal; Notable for the following components:      Result Value   RBC 3.90 (*)    Hemoglobin 11.8 (*)    HCT 36.9 (*)    RDW 18.6 (*)    All other components within normal limits  URINALYSIS, ROUTINE W REFLEX  MICROSCOPIC - Abnormal; Notable for the following components:   Color, Urine STRAW (*)    APPearance CLEAR (*)    Specific Gravity, Urine 1.003 (*)    All other components within normal limits  BASIC METABOLIC PANEL WITH GFR  TROPONIN I (HIGH SENSITIVITY)     EKG  I, Marylynn Soho, attending physician, personally viewed and interpreted this EKG  EKG Time: 1720 Rate: 78 Rhythm: normal sinus rhythm Axis: normal Intervals: qtc 456 QRS: narrow ST changes: no st elevation Impression: normal ekg  RADIOLOGY I independently interpreted and visualized the CXR. My interpretation: No pneumonia Radiology interpretation:  IMPRESSION:  1. No active cardiopulmonary disease.  2.  Aortic Atherosclerosis (ICD10-I70.0).      PROCEDURES:  Critical Care performed: No    MEDICATIONS ORDERED IN ED: Medications - No data to display   IMPRESSION / MDM / ASSESSMENT AND PLAN / ED COURSE  I reviewed the triage vital signs and  the nursing notes.                              Differential diagnosis includes, but is not limited to, ACS, pneumonia, gastritis  Patient's presentation is most consistent with acute presentation with potential threat to life or bodily function.  Patient presented to the emergency department today because of concerns for chest pain.  Patient is no longer having the pain at the time my exam.  EKG without concerning ST elevation.  Initial troponin was negative.  Chest x-ray without any pneumonia.  I did discuss with patient desire to repeat troponin to further evaluate for possible heart attack.  However patient stated that he would like to be discharged.  Did discuss risk of heart attack and death.  Patient voiced understanding.     FINAL CLINICAL IMPRESSION(S) / ED DIAGNOSES   Final diagnoses:  Nonspecific chest pain      Note:  This document was prepared using Dragon voice recognition software and may include unintentional dictation errors.    Marylynn Soho, MD 07/31/23 [phone]

## 2023-07-31 NOTE — ED Notes (Signed)
 Pt used urinal ua sent to lab

## 2023-07-31 NOTE — ED Notes (Signed)
Pt awaiting for ride

## 2023-07-31 NOTE — ED Notes (Signed)
 Pt's ride arrived [place]. Pt safely discharged home

## 2023-07-31 NOTE — ED Notes (Signed)
 Pt ask if we could get him a ride home, states he was transported to the hospital via EMS, advised pt will notify RN to see what options are available if any.

## 2023-08-09 ENCOUNTER — Encounter: Payer: MEDICAID | Admitting: Physician Assistant

## 2023-09-07 ENCOUNTER — Ambulatory Visit: Payer: MEDICAID | Admitting: Physician Assistant

## 2023-09-07 ENCOUNTER — Encounter (INDEPENDENT_AMBULATORY_CARE_PROVIDER_SITE_OTHER): Payer: Self-pay

## 2023-10-29 ENCOUNTER — Other Ambulatory Visit (INDEPENDENT_AMBULATORY_CARE_PROVIDER_SITE_OTHER): Payer: Self-pay | Admitting: Nurse Practitioner

## 2023-10-29 DIAGNOSIS — Z9889 Other specified postprocedural states: Secondary | ICD-10-CM

## 2023-11-03 ENCOUNTER — Ambulatory Visit (INDEPENDENT_AMBULATORY_CARE_PROVIDER_SITE_OTHER): Payer: MEDICAID | Admitting: Nurse Practitioner

## 2023-11-03 ENCOUNTER — Encounter (INDEPENDENT_AMBULATORY_CARE_PROVIDER_SITE_OTHER): Payer: Medicaid Other

## 2023-11-07 ENCOUNTER — Other Ambulatory Visit: Payer: Self-pay

## 2023-11-07 DIAGNOSIS — E876 Hypokalemia: Secondary | ICD-10-CM | POA: Insufficient documentation

## 2023-11-07 DIAGNOSIS — I2510 Atherosclerotic heart disease of native coronary artery without angina pectoris: Secondary | ICD-10-CM | POA: Insufficient documentation

## 2023-11-07 DIAGNOSIS — J45909 Unspecified asthma, uncomplicated: Secondary | ICD-10-CM | POA: Insufficient documentation

## 2023-11-07 DIAGNOSIS — E1142 Type 2 diabetes mellitus with diabetic polyneuropathy: Secondary | ICD-10-CM | POA: Insufficient documentation

## 2023-11-07 DIAGNOSIS — I10 Essential (primary) hypertension: Secondary | ICD-10-CM | POA: Insufficient documentation

## 2023-11-07 DIAGNOSIS — Z79899 Other long term (current) drug therapy: Secondary | ICD-10-CM | POA: Insufficient documentation

## 2023-11-07 DIAGNOSIS — Z8616 Personal history of COVID-19: Secondary | ICD-10-CM | POA: Insufficient documentation

## 2023-11-07 DIAGNOSIS — F332 Major depressive disorder, recurrent severe without psychotic features: Secondary | ICD-10-CM | POA: Diagnosis not present

## 2023-11-07 DIAGNOSIS — F1010 Alcohol abuse, uncomplicated: Secondary | ICD-10-CM | POA: Insufficient documentation

## 2023-11-07 DIAGNOSIS — E039 Hypothyroidism, unspecified: Secondary | ICD-10-CM | POA: Insufficient documentation

## 2023-11-07 DIAGNOSIS — Z87442 Personal history of urinary calculi: Secondary | ICD-10-CM | POA: Insufficient documentation

## 2023-11-07 DIAGNOSIS — R45851 Suicidal ideations: Secondary | ICD-10-CM | POA: Insufficient documentation

## 2023-11-07 NOTE — ED Triage Notes (Signed)
 Pt presented to ED POV brought in by a friend stating I hate myself. States has been having these feelings x 2 weeks. Friend states pt drinks 1 gallon of liquor a day. Friend states he has been expressing SI x 2 days stating I'm done, I cannot do this much long. Pt states wife died a year ago. Friend also states pt has been very hostile.

## 2023-11-08 ENCOUNTER — Inpatient Hospital Stay
Admission: AD | Admit: 2023-11-08 | Discharge: 2023-11-15 | DRG: 885 | Disposition: A | Discharge status: Home / Self Care | Payer: Self-pay | Source: Intra-hospital | Attending: Psychiatry | Admitting: Psychiatry

## 2023-11-08 ENCOUNTER — Emergency Department

## 2023-11-08 ENCOUNTER — Emergency Department
Admission: EM | Admit: 2023-11-08 | Discharge: 2023-11-08 | Disposition: A | Discharge status: Other Acute Inpatient Hospital | Payer: MEDICAID | Attending: Emergency Medicine | Admitting: Emergency Medicine

## 2023-11-08 DIAGNOSIS — R45851 Suicidal ideations: Secondary | ICD-10-CM | POA: Diagnosis present

## 2023-11-08 DIAGNOSIS — Z955 Presence of coronary angioplasty implant and graft: Secondary | ICD-10-CM

## 2023-11-08 DIAGNOSIS — Z9884 Bariatric surgery status: Secondary | ICD-10-CM | POA: Diagnosis not present

## 2023-11-08 DIAGNOSIS — N400 Enlarged prostate without lower urinary tract symptoms: Secondary | ICD-10-CM | POA: Diagnosis present

## 2023-11-08 DIAGNOSIS — F988 Other specified behavioral and emotional disorders with onset usually occurring in childhood and adolescence: Secondary | ICD-10-CM | POA: Diagnosis present

## 2023-11-08 DIAGNOSIS — Z860100 Personal history of colon polyps, unspecified: Secondary | ICD-10-CM

## 2023-11-08 DIAGNOSIS — Z8616 Personal history of COVID-19: Secondary | ICD-10-CM

## 2023-11-08 DIAGNOSIS — M159 Polyosteoarthritis, unspecified: Secondary | ICD-10-CM | POA: Diagnosis present

## 2023-11-08 DIAGNOSIS — I2510 Atherosclerotic heart disease of native coronary artery without angina pectoris: Secondary | ICD-10-CM | POA: Diagnosis present

## 2023-11-08 DIAGNOSIS — Z79890 Hormone replacement therapy (postmenopausal): Secondary | ICD-10-CM | POA: Diagnosis not present

## 2023-11-08 DIAGNOSIS — Z89512 Acquired absence of left leg below knee: Secondary | ICD-10-CM

## 2023-11-08 DIAGNOSIS — F1090 Alcohol use, unspecified, uncomplicated: Secondary | ICD-10-CM

## 2023-11-08 DIAGNOSIS — E785 Hyperlipidemia, unspecified: Secondary | ICD-10-CM | POA: Diagnosis present

## 2023-11-08 DIAGNOSIS — Y908 Blood alcohol level of 240 mg/100 ml or more: Secondary | ICD-10-CM | POA: Diagnosis present

## 2023-11-08 DIAGNOSIS — I252 Old myocardial infarction: Secondary | ICD-10-CM

## 2023-11-08 DIAGNOSIS — E1151 Type 2 diabetes mellitus with diabetic peripheral angiopathy without gangrene: Secondary | ICD-10-CM | POA: Diagnosis present

## 2023-11-08 DIAGNOSIS — F1020 Alcohol dependence, uncomplicated: Secondary | ICD-10-CM | POA: Diagnosis present

## 2023-11-08 DIAGNOSIS — E876 Hypokalemia: Secondary | ICD-10-CM

## 2023-11-08 DIAGNOSIS — Z833 Family history of diabetes mellitus: Secondary | ICD-10-CM | POA: Diagnosis not present

## 2023-11-08 DIAGNOSIS — F411 Generalized anxiety disorder: Secondary | ICD-10-CM | POA: Diagnosis present

## 2023-11-08 DIAGNOSIS — G8929 Other chronic pain: Secondary | ICD-10-CM | POA: Diagnosis present

## 2023-11-08 DIAGNOSIS — Z5982 Transportation insecurity: Secondary | ICD-10-CM

## 2023-11-08 DIAGNOSIS — E8729 Other acidosis: Secondary | ICD-10-CM | POA: Diagnosis present

## 2023-11-08 DIAGNOSIS — Z7401 Bed confinement status: Secondary | ICD-10-CM | POA: Diagnosis not present

## 2023-11-08 DIAGNOSIS — Z6838 Body mass index (BMI) 38.0-38.9, adult: Secondary | ICD-10-CM

## 2023-11-08 DIAGNOSIS — Z888 Allergy status to other drugs, medicaments and biological substances status: Secondary | ICD-10-CM

## 2023-11-08 DIAGNOSIS — E039 Hypothyroidism, unspecified: Secondary | ICD-10-CM | POA: Diagnosis present

## 2023-11-08 DIAGNOSIS — Z9682 Presence of neurostimulator: Secondary | ICD-10-CM

## 2023-11-08 DIAGNOSIS — K219 Gastro-esophageal reflux disease without esophagitis: Secondary | ICD-10-CM | POA: Diagnosis present

## 2023-11-08 DIAGNOSIS — R0902 Hypoxemia: Secondary | ICD-10-CM | POA: Diagnosis present

## 2023-11-08 DIAGNOSIS — Z87442 Personal history of urinary calculi: Secondary | ICD-10-CM

## 2023-11-08 DIAGNOSIS — E669 Obesity, unspecified: Secondary | ICD-10-CM | POA: Diagnosis present

## 2023-11-08 DIAGNOSIS — E1142 Type 2 diabetes mellitus with diabetic polyneuropathy: Secondary | ICD-10-CM | POA: Diagnosis present

## 2023-11-08 DIAGNOSIS — F1410 Cocaine abuse, uncomplicated: Secondary | ICD-10-CM | POA: Diagnosis present

## 2023-11-08 DIAGNOSIS — Z79891 Long term (current) use of opiate analgesic: Secondary | ICD-10-CM

## 2023-11-08 DIAGNOSIS — J4520 Mild intermittent asthma, uncomplicated: Secondary | ICD-10-CM

## 2023-11-08 DIAGNOSIS — F332 Major depressive disorder, recurrent severe without psychotic features: Principal | ICD-10-CM | POA: Diagnosis present

## 2023-11-08 DIAGNOSIS — Z79899 Other long term (current) drug therapy: Secondary | ICD-10-CM

## 2023-11-08 DIAGNOSIS — R1010 Upper abdominal pain, unspecified: Secondary | ICD-10-CM | POA: Diagnosis present

## 2023-11-08 DIAGNOSIS — I10 Essential (primary) hypertension: Secondary | ICD-10-CM | POA: Diagnosis present

## 2023-11-08 DIAGNOSIS — F410 Panic disorder [episodic paroxysmal anxiety] without agoraphobia: Secondary | ICD-10-CM | POA: Diagnosis present

## 2023-11-08 DIAGNOSIS — Z89421 Acquired absence of other right toe(s): Secondary | ICD-10-CM

## 2023-11-08 DIAGNOSIS — Z634 Disappearance and death of family member: Secondary | ICD-10-CM

## 2023-11-08 DIAGNOSIS — Z5986 Financial insecurity: Secondary | ICD-10-CM

## 2023-11-08 DIAGNOSIS — E559 Vitamin D deficiency, unspecified: Secondary | ICD-10-CM | POA: Diagnosis present

## 2023-11-08 DIAGNOSIS — M109 Gout, unspecified: Secondary | ICD-10-CM | POA: Diagnosis present

## 2023-11-08 DIAGNOSIS — N529 Male erectile dysfunction, unspecified: Secondary | ICD-10-CM | POA: Diagnosis present

## 2023-11-08 DIAGNOSIS — J45909 Unspecified asthma, uncomplicated: Secondary | ICD-10-CM | POA: Diagnosis present

## 2023-11-08 DIAGNOSIS — Z602 Problems related to living alone: Secondary | ICD-10-CM | POA: Diagnosis present

## 2023-11-08 DIAGNOSIS — G4730 Sleep apnea, unspecified: Secondary | ICD-10-CM | POA: Diagnosis present

## 2023-11-08 DIAGNOSIS — F329 Major depressive disorder, single episode, unspecified: Secondary | ICD-10-CM

## 2023-11-08 DIAGNOSIS — Z8711 Personal history of peptic ulcer disease: Secondary | ICD-10-CM

## 2023-11-08 LAB — COMPREHENSIVE METABOLIC PANEL WITH GFR
ALT: 53 U/L — ABNORMAL HIGH (ref 0–44)
ALT: 64 U/L — ABNORMAL HIGH (ref 0–44)
AST: 140 U/L — ABNORMAL HIGH (ref 15–41)
AST: 184 U/L — ABNORMAL HIGH (ref 15–41)
Albumin: 2.5 g/dL — ABNORMAL LOW (ref 3.5–5.0)
Albumin: 2.9 g/dL — ABNORMAL LOW (ref 3.5–5.0)
Alkaline Phosphatase: 128 U/L — ABNORMAL HIGH (ref 38–126)
Alkaline Phosphatase: 148 U/L — ABNORMAL HIGH (ref 38–126)
Anion gap: 10 (ref 5–15)
Anion gap: 16 — ABNORMAL HIGH (ref 5–15)
BUN: <5 mg/dL — ABNORMAL LOW (ref 8–23)
BUN: <5 mg/dL — ABNORMAL LOW (ref 8–23)
CO2: 20 mmol/L — ABNORMAL LOW (ref 22–32)
CO2: 24 mmol/L (ref 22–32)
Calcium: 7.4 mg/dL — ABNORMAL LOW (ref 8.9–10.3)
Calcium: 8 mg/dL — ABNORMAL LOW (ref 8.9–10.3)
Chloride: 101 mmol/L (ref 98–111)
Chloride: 103 mmol/L (ref 98–111)
Creatinine, Ser: 0.62 mg/dL (ref 0.61–1.24)
Creatinine, Ser: 0.84 mg/dL (ref 0.61–1.24)
GFR, Estimated: >60 mL/min (ref >60)
GFR, Estimated: >60 mL/min (ref >60)
Glucose, Bld: 112 mg/dL — ABNORMAL HIGH (ref 70–99)
Glucose, Bld: 135 mg/dL — ABNORMAL HIGH (ref 70–99)
Potassium: 2.9 mmol/L — ABNORMAL LOW (ref 3.5–5.1)
Potassium: 4.5 mmol/L (ref 3.5–5.1)
Sodium: 135 mmol/L (ref 135–145)
Sodium: 139 mmol/L (ref 135–145)
Total Bilirubin: 0.9 mg/dL (ref 0.0–1.2)
Total Bilirubin: 1.8 mg/dL — ABNORMAL HIGH (ref 0.0–1.2)
Total Protein: 5.3 g/dL — ABNORMAL LOW (ref 6.5–8.1)
Total Protein: 6.7 g/dL (ref 6.5–8.1)

## 2023-11-08 LAB — CBC
HCT: 39.6 % (ref 39.0–52.0)
Hemoglobin: 13.4 g/dL (ref 13.0–17.0)
MCH: 31.4 pg (ref 26.0–34.0)
MCHC: 33.8 g/dL (ref 30.0–36.0)
MCV: 92.7 fL (ref 80.0–100.0)
Platelets: 151 K/uL (ref 150–400)
RBC: 4.27 MIL/uL (ref 4.22–5.81)
RDW: 22.4 % — ABNORMAL HIGH (ref 11.5–15.5)
WBC: 6.9 K/uL (ref 4.0–10.5)
nRBC: 0.3 % — ABNORMAL HIGH (ref 0.0–0.2)

## 2023-11-08 LAB — URINE DRUG SCREEN, QUALITATIVE (ARMC ONLY)
Amphetamines, Ur Screen: NOT DETECTED
Barbiturates, Ur Screen: NOT DETECTED
Benzodiazepine, Ur Scrn: NOT DETECTED
Cannabinoid 50 Ng, Ur ~~LOC~~: NOT DETECTED
Cocaine Metabolite,Ur ~~LOC~~: NOT DETECTED
MDMA (Ecstasy)Ur Screen: NOT DETECTED
Methadone Scn, Ur: NOT DETECTED
Opiate, Ur Screen: NOT DETECTED
Phencyclidine (PCP) Ur S: NOT DETECTED
Tricyclic, Ur Screen: NOT DETECTED

## 2023-11-08 LAB — BILIRUBIN, DIRECT: Bilirubin, Direct: 0.6 mg/dL — ABNORMAL HIGH (ref 0.0–0.2)

## 2023-11-08 LAB — MAGNESIUM: Magnesium: 1.4 mg/dL — ABNORMAL LOW (ref 1.7–2.4)

## 2023-11-08 LAB — SARS CORONAVIRUS 2 BY RT PCR: SARS Coronavirus 2 by RT PCR: NEGATIVE

## 2023-11-08 LAB — ETHANOL: Alcohol, Ethyl (B): 310 mg/dL (ref <15)

## 2023-11-08 MED ORDER — DULOXETINE HCL 60 MG PO CPEP
60.0000 mg | ORAL_CAPSULE | Freq: Every day | ORAL | Status: DC
  Administered 2023-11-09 – 2023-11-11 (×3): 60 mg via ORAL
  Filled 2023-11-08 (×3): qty 1

## 2023-11-08 MED ORDER — BUPROPION HCL ER (XL) 300 MG PO TB24
300.0000 mg | ORAL_TABLET | Freq: Every day | ORAL | Status: DC
  Administered 2023-11-09 – 2023-11-15 (×7): 300 mg via ORAL
  Filled 2023-11-08 (×8): qty 1

## 2023-11-08 MED ORDER — THIAMINE MONONITRATE 100 MG PO TABS
100.0000 mg | ORAL_TABLET | Freq: Every day | ORAL | Status: DC
  Administered 2023-11-09 – 2023-11-15 (×7): 100 mg via ORAL
  Filled 2023-11-08 (×7): qty 1

## 2023-11-08 MED ORDER — POTASSIUM CHLORIDE CRYS ER 20 MEQ PO TBCR
40.0000 meq | EXTENDED_RELEASE_TABLET | Freq: Two times a day (BID) | ORAL | Status: DC
  Administered 2023-11-08: 40 meq via ORAL
  Filled 2023-11-08: qty 2

## 2023-11-08 MED ORDER — LEVOTHYROXINE SODIUM 50 MCG PO TABS
75.0000 ug | ORAL_TABLET | Freq: Every day | ORAL | Status: DC
  Administered 2023-11-08: 75 ug via ORAL
  Filled 2023-11-08: qty 2

## 2023-11-08 MED ORDER — ATORVASTATIN CALCIUM 10 MG PO TABS
40.0000 mg | ORAL_TABLET | Freq: Every day | ORAL | Status: DC
  Administered 2023-11-09 – 2023-11-15 (×7): 40 mg via ORAL
  Filled 2023-11-08 (×7): qty 4

## 2023-11-08 MED ORDER — OLANZAPINE 5 MG PO TBDP: 5.0000 mg | ORAL_TABLET | Freq: Three times a day (TID) | ORAL | Status: DC | PRN

## 2023-11-08 MED ORDER — ONDANSETRON HCL 4 MG/2ML IJ SOLN
4.0000 mg | Freq: Once | INTRAMUSCULAR | Status: AC
  Administered 2023-11-08: 4 mg via INTRAVENOUS

## 2023-11-08 MED ORDER — POTASSIUM CHLORIDE 10 MEQ/100ML IV SOLN
10.0000 meq | INTRAVENOUS | Status: AC
  Administered 2023-11-08 (×2): 10 meq via INTRAVENOUS
  Filled 2023-11-08 (×2): qty 100

## 2023-11-08 MED ORDER — MAGNESIUM HYDROXIDE 400 MG/5ML PO SUSP
30.0000 mL | Freq: Every day | ORAL | Status: DC | PRN
  Administered 2023-11-11 – 2023-11-12 (×2): 30 mL via ORAL
  Filled 2023-11-08 (×2): qty 30

## 2023-11-08 MED ORDER — AMLODIPINE BESYLATE 5 MG PO TABS
5.0000 mg | ORAL_TABLET | Freq: Every day | ORAL | Status: DC
  Administered 2023-11-09 – 2023-11-15 (×7): 5 mg via ORAL
  Filled 2023-11-08 (×7): qty 1

## 2023-11-08 MED ORDER — ACETAMINOPHEN 325 MG PO TABS
650.0000 mg | ORAL_TABLET | Freq: Four times a day (QID) | ORAL | Status: DC | PRN
  Administered 2023-11-09 – 2023-11-10 (×3): 650 mg via ORAL
  Filled 2023-11-08 (×5): qty 2

## 2023-11-08 MED ORDER — LORAZEPAM 2 MG/ML IJ SOLN: 0.0000 mg | Freq: Two times a day (BID) | INTRAMUSCULAR | Status: AC

## 2023-11-08 MED ORDER — LORAZEPAM 1 MG PO TABS
0.0000 mg | ORAL_TABLET | Freq: Two times a day (BID) | ORAL | Status: AC
  Filled 2023-11-08: qty 1

## 2023-11-08 MED ORDER — THIAMINE HCL 100 MG/ML IJ SOLN
100.0000 mg | Freq: Every day | INTRAMUSCULAR | Status: DC
  Filled 2023-11-08: qty 2

## 2023-11-08 MED ORDER — LORAZEPAM 2 MG/ML IJ SOLN: 0.0000 mg | Freq: Four times a day (QID) | INTRAMUSCULAR | Status: AC

## 2023-11-08 MED ORDER — ALBUTEROL SULFATE HFA 108 (90 BASE) MCG/ACT IN AERS: 2.0000 | INHALATION_SPRAY | Freq: Four times a day (QID) | RESPIRATORY_TRACT | Status: DC | PRN

## 2023-11-08 MED ORDER — LEVOTHYROXINE SODIUM 75 MCG PO TABS
75.0000 ug | ORAL_TABLET | Freq: Every day | ORAL | Status: DC
  Administered 2023-11-09 – 2023-11-15 (×7): 75 ug via ORAL
  Filled 2023-11-08 (×7): qty 1

## 2023-11-08 MED ORDER — MAGNESIUM SULFATE 2 GM/50ML IV SOLN
2.0000 g | Freq: Once | INTRAVENOUS | Status: AC
  Administered 2023-11-08: 2 g via INTRAVENOUS
  Filled 2023-11-08: qty 50

## 2023-11-08 MED ORDER — OLANZAPINE 10 MG IM SOLR: 5.0000 mg | Freq: Three times a day (TID) | INTRAMUSCULAR | Status: DC | PRN

## 2023-11-08 MED ORDER — LORAZEPAM 2 MG/ML IJ SOLN: 0.0000 mg | Freq: Two times a day (BID) | INTRAMUSCULAR | Status: DC

## 2023-11-08 MED ORDER — HYALURONIDASE HUMAN 150 UNIT/ML IJ SOLN
150.0000 [IU] | Freq: Once | INTRAMUSCULAR | Status: AC
  Administered 2023-11-08: 150 [IU] via SUBCUTANEOUS
  Filled 2023-11-08: qty 1

## 2023-11-08 MED ORDER — POTASSIUM CHLORIDE 10 MEQ/100ML IV SOLN
10.0000 meq | Freq: Once | INTRAVENOUS | Status: AC
  Administered 2023-11-08: 10 meq via INTRAVENOUS
  Filled 2023-11-08: qty 100

## 2023-11-08 MED ORDER — ALUM & MAG HYDROXIDE-SIMETH 200-200-20 MG/5ML PO SUSP
30.0000 mL | ORAL | Status: DC | PRN
  Filled 2023-11-08: qty 30

## 2023-11-08 MED ORDER — LACTATED RINGERS IV BOLUS
1000.0000 mL | Freq: Once | INTRAVENOUS | Status: AC
  Administered 2023-11-08: 1000 mL via INTRAVENOUS

## 2023-11-08 MED ORDER — DULOXETINE HCL 60 MG PO CPEP
60.0000 mg | ORAL_CAPSULE | Freq: Every day | ORAL | Status: DC
  Administered 2023-11-08: 60 mg via ORAL
  Filled 2023-11-08: qty 1

## 2023-11-08 MED ORDER — LORAZEPAM 1 MG PO TABS
0.0000 mg | ORAL_TABLET | Freq: Four times a day (QID) | ORAL | Status: AC
  Administered 2023-11-08 – 2023-11-09 (×2): 1 mg via ORAL
  Filled 2023-11-08 (×2): qty 1

## 2023-11-08 MED ORDER — HYDROXYZINE HCL 25 MG PO TABS
25.0000 mg | ORAL_TABLET | Freq: Four times a day (QID) | ORAL | Status: DC | PRN
  Administered 2023-11-08 – 2023-11-14 (×4): 25 mg via ORAL
  Filled 2023-11-08 (×4): qty 1

## 2023-11-08 MED ORDER — CARVEDILOL 6.25 MG PO TABS
3.1250 mg | ORAL_TABLET | Freq: Two times a day (BID) | ORAL | Status: DC
  Administered 2023-11-08 (×2): 3.125 mg via ORAL
  Filled 2023-11-08 (×2): qty 1

## 2023-11-08 MED ORDER — ONDANSETRON HCL 4 MG/2ML IJ SOLN
INTRAMUSCULAR | Status: AC
  Filled 2023-11-08: qty 2

## 2023-11-08 MED ORDER — AMLODIPINE BESYLATE 5 MG PO TABS
5.0000 mg | ORAL_TABLET | Freq: Every day | ORAL | Status: DC
  Administered 2023-11-08: 5 mg via ORAL
  Filled 2023-11-08: qty 1

## 2023-11-08 MED ORDER — TRAZODONE HCL 50 MG PO TABS
50.0000 mg | ORAL_TABLET | Freq: Every evening | ORAL | Status: DC | PRN
  Administered 2023-11-08 – 2023-11-14 (×7): 50 mg via ORAL
  Filled 2023-11-08 (×7): qty 1

## 2023-11-08 MED ORDER — THIAMINE MONONITRATE 100 MG PO TABS
100.0000 mg | ORAL_TABLET | Freq: Every day | ORAL | Status: DC
  Administered 2023-11-08: 100 mg via ORAL
  Filled 2023-11-08: qty 1

## 2023-11-08 MED ORDER — THIAMINE HCL 100 MG/ML IJ SOLN: 100.0000 mg | Freq: Every day | INTRAMUSCULAR | Status: DC

## 2023-11-08 MED ORDER — CARVEDILOL 6.25 MG PO TABS
3.1250 mg | ORAL_TABLET | Freq: Two times a day (BID) | ORAL | Status: DC
  Administered 2023-11-09 – 2023-11-15 (×13): 3.125 mg via ORAL
  Filled 2023-11-08 (×13): qty 1

## 2023-11-08 MED ORDER — LORAZEPAM 2 MG/ML IJ SOLN
0.0000 mg | Freq: Four times a day (QID) | INTRAMUSCULAR | Status: DC
  Administered 2023-11-08: 1 mg via INTRAVENOUS
  Filled 2023-11-08: qty 1

## 2023-11-08 MED ORDER — LORAZEPAM 2 MG PO TABS: 0.0000 mg | ORAL_TABLET | Freq: Four times a day (QID) | ORAL | Status: DC

## 2023-11-08 MED ORDER — ATORVASTATIN CALCIUM 20 MG PO TABS
40.0000 mg | ORAL_TABLET | Freq: Every day | ORAL | Status: DC
  Administered 2023-11-08: 40 mg via ORAL
  Filled 2023-11-08: qty 2

## 2023-11-08 MED ORDER — BUPROPION HCL ER (XL) 150 MG PO TB24
300.0000 mg | ORAL_TABLET | Freq: Every day | ORAL | Status: DC
  Administered 2023-11-08: 300 mg via ORAL
  Filled 2023-11-08: qty 2

## 2023-11-08 MED ORDER — LORAZEPAM 2 MG PO TABS: 0.0000 mg | ORAL_TABLET | Freq: Two times a day (BID) | ORAL | Status: DC
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
## 2023-11-08 NOTE — ED Notes (Signed)
 Pt was provided dinner at bedside.
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
## 2023-11-08 NOTE — ED Notes (Signed)
 Patient pulled his IV out. Another IV placed.

## 2023-11-08 NOTE — ED Notes (Signed)
 Pt was provided a lunch at bedside.

## 2023-11-08 NOTE — ED Provider Notes (Signed)
-----------------------------------------   3:10 PM on 11/08/2023 -----------------------------------------  CMP showed an elevated anion gap, low bicarb, and several electrolyte abnormalities.  I ordered a repeat.  This shows an improved potassium and resolved metabolic acidosis.  LFTs are still elevated.  Suspect that this is chronic and related to the patient's alcohol  abuse.  However the bilirubin is elevated.  Direct bilirubin is not significant elevated.  I have ordered a right upper quadrant ultrasound since the patient is having some upper abdominal pain.  If this is negative I anticipate that he will be medically cleared for admission to psychiatry.  I have signed him out to the oncoming ED physician Dr. Nicholaus.   Jacolyn Pae, MD 11/08/23 1511

## 2023-11-08 NOTE — ED Notes (Signed)
 Pt's friend, Waddell Shams, left contact information [phone]

## 2023-11-08 NOTE — Plan of Care (Signed)
  Problem: Education: Goal: Ability to make informed decisions regarding treatment will improve Outcome: Not Progressing   Problem: Coping: Goal: Coping ability will improve Outcome: Not Progressing   Problem: Health Behavior/Discharge Planning: Goal: Identification of resources available to assist in meeting health care needs will improve Outcome: Not Progressing   Problem: Medication: Goal: Compliance with prescribed medication regimen will improve Outcome: Not Progressing   Problem: Self-Concept: Goal: Ability to disclose and discuss suicidal ideas will improve Outcome: Not Progressing Goal: Will verbalize positive feelings about self Outcome: Not Progressing Note: Patient is initiating therapy. Patient will work on increased adherence and work toward meeting this goal by discharge.

## 2023-11-08 NOTE — BH Assessment (Signed)
 Patient is to be admitted to [facility] on today 11/08/23 by Dr. Jadapalle.  Attending Physician will be Dr. Jadapalle.   Patient has been assigned to room L 35, by [facility] Charge Nurse, Devere.    ER staff is aware of the admission: Jon, ER Secretary   Dr. Nicholaus, ER MD  Burnard, Patient's Nurse  Curtistine, Patient Access.

## 2023-11-08 NOTE — ED Notes (Signed)
 Pt provided with urinal, but spilled urine on clothes. Clothes and linen changed and pt cleaned. Will attempt to get urine sample later

## 2023-11-08 NOTE — ED Notes (Addendum)
 RN at bedside to answer call bell. Pt dry heaving. CIWA performed

## 2023-11-08 NOTE — ED Notes (Signed)
 Pt up to bathroom.

## 2023-11-08 NOTE — ED Provider Notes (Signed)
 Emergency Medicine Observation Re-evaluation Note  Charles [name] is a 63 y.o. male, seen on rounds today.  Pt initially presented to the ED for complaints of Suicidal Currently, the patient is resting comfortably, denies abdominal pain, denies symptoms of withdrawal  He is here voluntarily and has a history of alcohol  use cocaine, hypertension hyperlipidemia hypothyroidism diabetes left BKA  Physical Exam  BP 122/69   Pulse 100   Temp 98.4 F (36.9 C) (Oral)   Resp 18   Wt 113.4 kg   SpO2 93%   BMI 32.98 kg/m  Physical Exam General: Resting comfortably chest light Cardiac: Appears well-perfused Abdomen: Nontender throughout especially in the right upper quadrant   ED Course / MDM  EKG:EKG Interpretation Date/Time:  Monday November 08 2023 01:04:10 EDT Ventricular Rate:  83 PR Interval:  160 QRS Duration:  82 QT Interval:  388 QTC Calculation: 455 R Axis:   5  Text Interpretation: Normal sinus rhythm Normal ECG When compared with ECG of 31-Jul-2023 17:20, Nonspecific T wave abnormality now evident in Inferior leads Confirmed by Neomi Neptune [phone]) on 11/08/2023 3:12:27 AM  I have reviewed the labs performed to date as well as medications administered while in observation.  Recent changes in the last 24 hours include patient had a right upper quadrant ultrasound which demonstrated no evidence of acute cholecystitis  Clinical Course as of 11/08/23 1535  Mon Nov 08, 2023  1525 US  RUQ  1. Diffuse hepatic steatosis. 2. Gallstone without sonographic findings of acute cholecystitis.     [HD]    Clinical Course User Index [HD] Nicholaus Rolland BRAVO, MD     Plan  Current plan is for patient is medically cleared for psychiatric placement    Nicholaus Rolland BRAVO, MD 11/08/23 1535

## 2023-11-08 NOTE — ED Notes (Signed)
 Pt's O2 sats decreased to 88% on RA while sleeping. Pt placed on 2L of O2 via nasal cannula

## 2023-11-08 NOTE — ED Notes (Signed)
 Pt given breakfast tray

## 2023-11-08 NOTE — ED Notes (Signed)
 VOL  GOING TO  [facility] ON 11/08/23
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
## 2023-11-08 NOTE — ED Notes (Signed)
Pt provided water per request.

## 2023-11-08 NOTE — ED Notes (Signed)
 Lab called to run direct bili

## 2023-11-08 NOTE — ED Notes (Signed)
 Assumed care of patient.

## 2023-11-08 NOTE — ED Notes (Signed)
 Pt's IV site was infiltrated. Magnesium  and Potassium infusions discontinued immediately and new IV site started in opposite extremity. Provider and pharmacist made aware.

## 2023-11-08 NOTE — BH IP Treatment Plan (Unsigned)
 Interdisciplinary Treatment and Diagnostic Plan Update  11/08/2023 Time of Session: *** Charles [name] MRN: [id number]  Principal Diagnosis: MDD (major depressive disorder), recurrent severe, without psychosis (HCC)  Secondary Diagnoses: Principal Problem:   MDD (major depressive disorder), recurrent severe, without psychosis (HCC)   Current Medications:  Current Facility-Administered Medications  Medication Dose Route Frequency Provider Last Rate Last Admin   acetaminophen  (TYLENOL ) tablet 650 mg  650 mg Oral Q6H PRN Donnelly Mellow, MD       albuterol  (VENTOLIN  HFA) 108 (90 Base) MCG/ACT inhaler 2 puff  2 puff Inhalation Q6H PRN Donnelly Mellow, MD       alum & mag hydroxide-simeth (MAALOX/MYLANTA) 200-200-20 MG/5ML suspension 30 mL  30 mL Oral Q4H PRN Jadapalle, Sree, MD       [START ON 11/09/2023] amLODipine  (NORVASC ) tablet 5 mg  5 mg Oral Daily Jadapalle, Sree, MD       NOREEN ON 11/09/2023] atorvastatin  (LIPITOR ) tablet 40 mg  40 mg Oral Daily Jadapalle, Sree, MD       NOREEN ON 11/09/2023] buPROPion  (WELLBUTRIN  XL) 24 hr tablet 300 mg  300 mg Oral Daily Jadapalle, Sree, MD       NOREEN ON 11/09/2023] carvedilol  (COREG ) tablet 3.125 mg  3.125 mg Oral BID WC Jadapalle, Sree, MD       [START ON 11/09/2023] DULoxetine  (CYMBALTA ) DR capsule 60 mg  60 mg Oral Daily Jadapalle, Sree, MD       hydrOXYzine  (ATARAX ) tablet 25 mg  25 mg Oral Q6H PRN Jadapalle, Sree, MD       [START ON 11/09/2023] levothyroxine  (SYNTHROID ) tablet 75 mcg  75 mcg Oral Q0600 Jadapalle, Sree, MD       [START ON 11/09/2023] LORazepam  (ATIVAN ) injection 0-4 mg  0-4 mg Intravenous Q6H Jadapalle, Sree, MD       Or   NOREEN ON 11/09/2023] LORazepam  (ATIVAN ) tablet 0-4 mg  0-4 mg Oral Q6H Jadapalle, Sree, MD       [START ON 11/10/2023] LORazepam  (ATIVAN ) injection 0-4 mg  0-4 mg Intravenous Q12H Jadapalle, Sree, MD       Or   NOREEN ON 11/10/2023] LORazepam  (ATIVAN ) tablet 0-4 mg  0-4 mg Oral Q12H Jadapalle, Sree, MD        magnesium  hydroxide (MILK OF MAGNESIA) suspension 30 mL  30 mL Oral Daily PRN Donnelly Mellow, MD       OLANZapine  (ZYPREXA ) injection 5 mg  5 mg Intramuscular TID PRN Jadapalle, Sree, MD       OLANZapine  zydis (ZYPREXA ) disintegrating tablet 5 mg  5 mg Oral TID PRN Jadapalle, Sree, MD       NOREEN ON 11/09/2023] thiamine  (VITAMIN B1) tablet 100 mg  100 mg Oral Daily Jadapalle, Sree, MD       Or   NOREEN ON 11/09/2023] thiamine  (VITAMIN B1) injection 100 mg  100 mg Intravenous Daily Jadapalle, Sree, MD       traZODone  (DESYREL ) tablet 50 mg  50 mg Oral QHS PRN Jadapalle, Sree, MD       PTA Medications: Medications Prior to Admission  Medication Sig Dispense Refill Last Dose/Taking   albuterol  (PROVENTIL  HFA;VENTOLIN  HFA) 108 (90 Base) MCG/ACT inhaler Inhale 2 puffs into the lungs every 6 (six) hours as needed for wheezing or shortness of breath. 1 Inhaler 5    amLODipine  (NORVASC ) 5 MG tablet Take 1 tablet (5 mg total) by mouth daily. 30 tablet 3    ascorbic acid  (VITAMIN C ) 500 MG tablet Take 1 tablet (500  mg total) by mouth daily. 30 tablet 0    atorvastatin  (LIPITOR ) 40 MG tablet TAKE ONE TABLET BY MOUTH AT BEDTIME FOR CHLOESTEROL 90 tablet 1    buPROPion  (WELLBUTRIN  XL) 300 MG 24 hr tablet Take 1 tablet (300 mg total) by mouth daily. 30 tablet 0    carvedilol  (COREG ) 3.125 MG tablet Take 1 tablet (3.125 mg total) by mouth 2 (two) times daily with a meal. 30 tablet 3    cholecalciferol  (VITAMIN D ) 25 MCG tablet Take 2 tablets (2,000 Units total) by mouth daily. 60 tablet 0    DULoxetine  (CYMBALTA ) 60 MG capsule Take 1 capsule (60 mg total) by mouth daily. 30 capsule 0    furosemide  (LASIX ) 20 MG tablet TAKE ONE TABLET BY MOUTH EVERY DAY AS NEEDED FOR SWELLING 30 tablet 3    hydrOXYzine  (ATARAX ) 25 MG tablet Take 1 tablet (25 mg total) by mouth 3 (three) times daily as needed for anxiety. 30 tablet 0    levothyroxine  (SYNTHROID ) 75 MCG tablet Take 1 tablet (75 mcg total) by mouth daily at 6  (six) AM. 30 tablet 3    lisdexamfetamine (VYVANSE ) 40 MG capsule TAKE 1 CAPSULE BY MOUTH EVERY MORNING 30 capsule 0    risperiDONE  (RISPERDAL ) 0.5 MG tablet Take 1 tablet (0.5 mg total) by mouth 2 (two) times daily at 8 am and 4 pm. 60 tablet 0    sildenafil  (REVATIO ) 20 MG tablet TAKE 1 TABLET BY MOUTH DAILY AS NEEDED FOR ERECTILE DYSFUNCTION 30 tablet 1    zinc  sulfate 220 (50 Zn) MG capsule Take 1 capsule (220 mg total) by mouth daily. 30 capsule 0    zolpidem  (AMBIEN ) 10 MG tablet TAKE ONE TABLET AT BEDTIME IF NEEDED FORSLEEP 30 tablet 2     Patient Stressors:    Patient Strengths:    Treatment Modalities: Medication Management, Group therapy, Case management,  1 to 1 session with clinician, Psychoeducation, Recreational therapy.   Physician Treatment Plan for Primary Diagnosis: MDD (major depressive disorder), recurrent severe, without psychosis (HCC) Long Term Goal(s):     Short Term Goals:    Medication Management: Evaluate patient's response, side effects, and tolerance of medication regimen.  Therapeutic Interventions: 1 to 1 sessions, Unit Group sessions and Medication administration.  Evaluation of Outcomes: {BHH Tx Plan Outcomes:30414004}  Physician Treatment Plan for Secondary Diagnosis: Principal Problem:   MDD (major depressive disorder), recurrent severe, without psychosis (HCC)  Long Term Goal(s):     Short Term Goals:       Medication Management: Evaluate patient's response, side effects, and tolerance of medication regimen.  Therapeutic Interventions: 1 to 1 sessions, Unit Group sessions and Medication administration.  Evaluation of Outcomes: {BHH Tx Plan Outcomes:30414004}   RN Treatment Plan for Primary Diagnosis: MDD (major depressive disorder), recurrent severe, without psychosis (HCC) Long Term Goal(s): Knowledge of disease and therapeutic regimen to maintain health will improve  Short Term Goals: Ability to remain free from injury will improve,  Ability to verbalize frustration and anger appropriately will improve, and Ability to demonstrate self-control  Medication Management: RN will administer medications as ordered by provider, will assess and evaluate patient's response and provide education to patient for prescribed medication. RN will report any adverse and/or side effects to prescribing provider.  Therapeutic Interventions: 1 on 1 counseling sessions, Psychoeducation, Medication administration, Evaluate responses to treatment, Monitor vital signs and CBGs as ordered, Perform/monitor CIWA, COWS, AIMS and Fall Risk screenings as ordered, Perform wound care treatments as ordered.  Evaluation of Outcomes: Not Progressing   LCSW Treatment Plan for Primary Diagnosis: MDD (major depressive disorder), recurrent severe, without psychosis (HCC) Long Term Goal(s): Safe transition to appropriate next level of care at discharge, Engage patient in therapeutic group addressing interpersonal concerns.  Short Term Goals:   Therapeutic Interventions: Assess for all discharge needs, 1 to 1 time with Child psychotherapist, Explore available resources and support systems, Assess for adequacy in community support network, Educate family and significant other(s) on suicide prevention, Complete Psychosocial Assessment, Interpersonal group therapy.  Evaluation of Outcomes: {BHH Tx Plan Outcomes:30414004}   Progress in Treatment: Attending groups: {BHH ADULT:22608} Participating in groups: {BHH ADULT:22608} Taking medication as prescribed: {BHH ADULT:22608} Toleration medication: {BHH ADULT:22608} Family/Significant other contact made: {YES/NO/CONTACT:22665} Patient understands diagnosis: {BHH JILOU:77391} Discussing patient identified problems/goals with staff: {BHH JILOU:77391} Medical problems stabilized or resolved: {BHH ADULT:22608} Denies suicidal/homicidal ideation: {BHH ADULT:22608} Issues/concerns per patient self-inventory: {BHH  ADULT:22608} Other: ***  New problem(s) identified: {BHH NEW PROBLEMS:22609}  New Short Term/Long Term Goal(s):  Patient Goals:    Discharge Plan or Barriers:   Reason for Continuation of Hospitalization: Suicidal ideation  Estimated Length of Stay:  Last 3 [place] Suicide Severity Risk Score: Flowsheet Row ED from 11/08/2023 in [facility] Emergency Department at [facility] ED from 07/31/2023 in [facility] Emergency Department at [facility] Admission (Discharged) from 03/09/2023 in [facility]  C-SSRS RISK CATEGORY High Risk No Risk Low Risk    Last PHQ 2/9 Scores:    05/13/2023    9:22 AM 10/29/2022   10:14 AM 01/20/2022   10:49 AM  Depression screen PHQ 2/9  Decreased Interest 1 1 0  Down, Depressed, Hopeless 0 0 0  PHQ - 2 Score 1 1 0    Scribe for Treatment Team: Joen SAILOR Jamion Carter, RN 11/08/2023 11:03 PM

## 2023-11-09 ENCOUNTER — Encounter: Payer: Self-pay | Admitting: Psychiatry

## 2023-11-09 DIAGNOSIS — F332 Major depressive disorder, recurrent severe without psychotic features: Secondary | ICD-10-CM | POA: Diagnosis not present

## 2023-11-09 MED ORDER — FUROSEMIDE 20 MG PO TABS
20.0000 mg | ORAL_TABLET | Freq: Every day | ORAL | Status: DC
Start: 2023-11-09 — End: 2023-11-15
  Administered 2023-11-09 – 2023-11-15 (×7): 20 mg via ORAL
  Filled 2023-11-09 (×7): qty 1

## 2023-11-09 MED ORDER — VITAMIN D 25 MCG (1000 UNIT) PO TABS
2000.0000 [IU] | ORAL_TABLET | Freq: Every day | ORAL | Status: DC
  Administered 2023-11-09 – 2023-11-15 (×7): 2000 [IU] via ORAL
  Filled 2023-11-09 (×7): qty 2

## 2023-11-09 MED ORDER — ZOLPIDEM TARTRATE 5 MG PO TABS
10.0000 mg | ORAL_TABLET | Freq: Once | ORAL | Status: AC
  Administered 2023-11-10: 10 mg via ORAL
  Filled 2023-11-09: qty 2

## 2023-11-09 NOTE — Group Note (Signed)
 Date:  11/09/2023 Time:  1:38 PM  Group Topic/Focus:  Coping With Mental Health Crisis:   The purpose of this group is to help patients identify strategies for coping with mental health crisis.  Group discusses possible causes of crisis and ways to manage them effectively. Healthy Communication:   The focus of this group is to discuss communication, barriers to communication, as well as healthy ways to communicate with others. Overcoming Stress:   The focus of this group is to define stress and help patients assess their triggers.    Participation Level:  Active  Participation Quality:  Appropriate  Affect:  Appropriate  Cognitive:  Appropriate  Insight: Appropriate  Engagement in Group:  Engaged  Modes of Intervention:  Activity and Discussion  Additional Comments:    Charles [name] 11/09/2023, 1:38 PM

## 2023-11-09 NOTE — Group Note (Signed)
 Recreation Therapy Group Note   Group Topic:General Recreation  Group Date: 11/09/2023 Start Time: 1100 End Time: 1135 Facilitators: Celestia Jeoffrey BRAVO, LRT, CTRS Location: Courtyard  Group Description: Outdoor Recreation. Patients had the option to play corn hole, ring toss, bowling or listening to music while outside in the courtyard getting fresh air and sunlight. Patients helped water and prune the raised garden beds. LRT and patients discussed things that they enjoy doing in their free time outside of the hospital. LRT encouraged patients to drink water after being active and getting their heart rate up.   Goal Area(s) Addressed: Patient will identify leisure interests.  Patient will practice healthy decision making. Patient will engage in recreation activity.   Affect/Mood: N/A   Participation Level: Did not attend    Clinical Observations/Individualized Feedback: Patient did not attend group.   Plan: Continue to engage patient in RT group sessions 2-3x/week.   Jeoffrey BRAVO Celestia, LRT, CTRS 11/09/2023 2:04 PM

## 2023-11-09 NOTE — Group Note (Signed)
 LCSW Group Therapy Note  Group Date: 11/09/2023 Start Time: 1315 End Time: 1400   Type of Therapy and Topic:  Group Therapy - Healthy vs Unhealthy Coping Skills  Participation Level:  None   Description of Group The focus of this group was to determine what unhealthy coping techniques typically are used by group members and what healthy coping techniques would be helpful in coping with various problems. Patients were guided in becoming aware of the differences between healthy and unhealthy coping techniques. Patients were asked to identify 2-3 healthy coping skills they would like to learn to use more effectively.  Therapeutic Goals Patients learned that coping is what human beings do all day long to deal with various situations in their lives Patients defined and discussed healthy vs unhealthy coping techniques Patients identified their preferred coping techniques and identified whether these were healthy or unhealthy Patients determined 2-3 healthy coping skills they would like to become more familiar with and use more often. Patients provided support and ideas to each other   Summary of Patient Progress: X   Therapeutic Modalities Cognitive Behavioral Therapy Motivational Interviewing  Charles [name], [place] 11/09/2023  3:46 PM

## 2023-11-09 NOTE — Progress Notes (Addendum)
 Admission Note: 63 y.o. WM transferred from [name] ED for voicing suicidal ideations without a plan. Patient is admitted on a voluntary consent. He is his own guardian. Patient presents calm and cooperative, although guarded and evasive during the admission interview/ assessment. Patient states that he has been feeling "depressed" since the death of his wife a year ago. He lives alone in his home. Disheveled and malodorous in appearance/ hygiene. He was brought in to the hospital by an old co-worker, who is also his friend named Charles [name]. He admits that he often stays inside his home for weeks at a time due feelings of depression. He denied experiencing auditory or visual hallucinations. He also denied having thoughts of homicidal ideations. Patient has a history of alcohol  use.  Denies experiencing seizures or DT's from withdrawing from alcohol . States that he drinks at least "every other day", with his last alcoholic drink on the July 21, before coming to the ED. His alcohol  level was 310 mg/dL upon toxicology collection.  He voiced that he drinks 1 gallon of liquor every other day. He said that one of his main goals during this admission is to "become sober again". CIWA assessment completed, noted score of 9. Pt. offered Ativan  1mg  for withdrawal. 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 No visible wounds or scaring noted upon skin assessment.  Vital signs stable. Patient denied being in any pain. Gait is unsteady, can only ambulate in short distances, uses a wheelchair for assistance with ambulation. Patient is able to tend to his ADL's independently. Patient oriented to the unit and daily routines. Patient offered an bedtime snack before retiring to bed for the night. Nursing care plan and ITP both initiated. Admitting orders maintained as written.

## 2023-11-09 NOTE — Plan of Care (Signed)
  Problem: Medication: Goal: Compliance with prescribed medication regimen will improve Outcome: Progressing   Problem: Education: Goal: Utilization of techniques to improve thought processes will improve Outcome: Progressing   Problem: Coping: Goal: Coping ability will improve Outcome: Progressing

## 2023-11-09 NOTE — Progress Notes (Signed)
   11/09/23 1000  Psych Admission Type (Psych Patients Only)  Admission Status Voluntary  Psychosocial Assessment  Patient Complaints Anxiety;Helplessness;Restlessness  Eye Contact Fair  Facial Expression Anxious  Affect Anxious  Speech Logical/coherent  Interaction Guarded  Motor Activity Slow  Appearance/Hygiene Poor hygiene;Body odor  Behavior Characteristics Anxious;Guarded  Mood Depressed;Irritable  Aggressive Behavior  Effect No apparent injury  Thought Process  Coherency WDL  Content WDL  Delusions WDL  Perception WDL  Hallucination None reported or observed  Judgment WDL  Confusion WDL  Danger to Self  Current suicidal ideation? Denies  Self-Injurious Behavior No self-injurious ideation or behavior indicators observed or expressed   Agreement Not to Harm Self Yes  Description of Agreement Verbal  Danger to Others  Danger to Others None reported or observed

## 2023-11-09 NOTE — H&P (Signed)
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 MG tablet TAKE ONE TABLET AT BEDTIME IF NEEDED FORSLEEP 30 tablet 2     Psychiatric Specialty Exam:  Presentation  General Appearance:  Appropriate for Environment; Casual  Eye Contact: Fair  Speech: Clear and Coherent  Speech Volume: Normal    Mood and Affect  Mood: Depressed; Dysphoric  Affect: Depressed; Flat   Thought Process  Thought Processes: Coherent  Descriptions of Associations:Intact  Orientation:Full (Time, Place and Person)  Thought Content:Logical  Hallucinations:Hallucinations: None  Ideas of Reference:None  Suicidal Thoughts:Suicidal Thoughts: Yes, Passive SI Passive Intent and/or Plan: Without Intent; Without Plan  Homicidal Thoughts:Homicidal Thoughts: No   Sensorium   Memory: Immediate Fair; Recent Fair; Remote Fair  Judgment: Impaired  Insight: Shallow   Executive Functions  Concentration: Fair  Attention Span: Fair  Recall: [employer] of Knowledge: Fair  Language: Fair   Psychomotor Activity  Psychomotor Activity: Psychomotor Activity: Normal   Assets  Assets: Communication Skills; Desire  for Improvement; Resilience; Social Support    Musculoskeletal: Strength & Muscle Tone: decreased Gait & Station: unsteady  Physical Exam: Physical Exam Vitals and nursing note reviewed.  HENT:     Head: Normocephalic.  Cardiovascular:     Rate and Rhythm: Normal rate.     Pulses: Normal pulses.  Neurological:     Mental Status: He is alert.    Review of Systems  Constitutional: Negative.   HENT: Negative.    Eyes: Negative.   Cardiovascular: Negative.   Skin: Negative.    Blood pressure 118/85, pulse 86, temperature 99.1 F (37.3 C), temperature source Oral, resp. rate 17, height 5' 8 (1.727 m), weight 113.4 kg, SpO2 98%. Body mass index is 38.01 kg/m.  Principal Diagnosis: MDD (major depressive disorder), recurrent severe, without psychosis (HCC) Diagnosis:  Principal Problem:   MDD (major depressive disorder), recurrent severe, without psychosis (HCC)   Clinical Decision Making: Patient with history of depression, multiple medical problems, above-knee amputation, with significant physical limitations of doing his ADLs, wife passing away recently, having no family or friends support, admitted for worsening depression and suicidal ideation and alcohol  use.  Patient is being monitored for acute withdrawal from alcohol  and management of depression.  Treatment Plan Summary:  Safety and Monitoring:             -- Voluntary admission to inpatient psychiatric unit for safety, stabilization and treatment             -- Daily contact with patient to assess and evaluate symptoms and progress in treatment             --  Patient's case to be discussed in multi-disciplinary team meeting             -- Observation Level: q15 minute checks             -- Vital signs:  q12 hours             -- Precautions: suicide, elopement, and assault   2. Psychiatric Diagnoses and Treatment:              CIWA protocol with Librium taper Restarted Wellbutrin  XL 300 mg 2 and duloxetine  60 mg with plan to titrated up.   -- The risks/benefits/side-effects/alternatives to this medication were discussed in detail with the patient and time was given for questions. The patient consents to medication trial.                -- Metabolic profile and EKG monitoring obtained while on an atypical antipsychotic (BMI: Lipid Panel: HbgA1c: QTc:)              -- Encouraged patient to participate in unit milieu and in scheduled group therapies                            3. Medical Issues Being Addressed:    PT/OT ordered to evaluate the need of home health PT 4. Discharge Planning:              -- Social work and case management to assist with discharge planning and identification of hospital follow-up needs prior to discharge             -- Estimated LOS: 5-7 days             -- Discharge Concerns: Need to establish a safety plan; Medication compliance and effectiveness             --  Discharge Goals: Return home with outpatient referrals follow ups  Physician Treatment Plan for Primary Diagnosis: MDD (major depressive disorder), recurrent severe, without psychosis (HCC) Long Term Goal(s): Improvement in symptoms so as ready for discharge  Short Term Goals: Ability to identify changes in lifestyle to reduce recurrence of condition will improve, Ability to verbalize feelings will improve, Ability to disclose and discuss suicidal ideas, Ability to demonstrate self-control will improve, and Ability to identify and develop effective coping behaviors will improve  Physician Treatment Plan for Secondary Diagnosis: Principal Problem:   MDD  (major depressive disorder), recurrent severe, without psychosis (HCC)  Long Term Goal(s): Improvement in symptoms so as ready for discharge  Short Term Goals: Ability to identify changes in lifestyle to reduce recurrence of condition will improve, Ability to verbalize feelings will improve, Ability to disclose and discuss suicidal ideas, Ability to demonstrate self-control will improve, and Ability to identify and develop effective coping behaviors will improve  I certify that inpatient services furnished can reasonably be expected to improve the patient's condition.    Morio Widen, MD 7/22/20254:09 PM

## 2023-11-09 NOTE — Group Note (Signed)
 Recreation Therapy Group Note   Group Topic:Emotion Expression  Group Date: 11/09/2023 Start Time: 1500 End Time: 1610 Facilitators: Celestia Jeoffrey BRAVO, LRT, CTRS Location: Dayroom  Group Description: Painting a Diplomatic Services operational officer. Patients and LRT discuss what it means to be "at peace", what it feels like physically and mentally. Pts are given a canvas and watercolor paint to use and encouraged to draw their idea of a peaceful place. Pts and LRT discuss how they use this in their daily life post discharge. Pts are encouraged to take their canvas home with them as a reminder to find their peaceful place whenever they are feeling depressed, anxious, etc.    Goal Area(s) Addressed:  Patient will identify what it means to experience a "peaceful" emotion. Patient will identify a new coping skill.  Patient will express their emotions through art. Patients will increase communication by talking with LRT and peers while in group.   Affect/Mood: N/A   Participation Level: Did not attend    Clinical Observations/Individualized Feedback: Glendia was originally present in group. Pt was pulled by PT and OT.   Plan: Continue to engage patient in RT group sessions 2-3x/week.   Jeoffrey BRAVO Celestia, LRT, CTRS 11/09/2023 5:14 PM

## 2023-11-09 NOTE — Evaluation (Signed)
 Occupational Therapy Evaluation Patient Details Name: Charles [name] MRN: [id number] DOB: 1960/04/26 Today's Date: 11/09/2023   History of Present Illness   Pt is a 63 y.o. male presenting to hospital 11/07/23 with c/o suicidal thoughts; feeling more depressed over past 2 weeks; drinks alcohol  every day.  PMH includes ADD, anginal pain, anxiety, BPH, chronic back pain, back sx, CAD, gout, MI, neuropathy, sleep apnea, DM, R achilles tendon sx, L AKA, R toe amputation, gastric bypass, spinal cord stimulator.   Clinical Impressions Pt was seen for OT evaluation this date. Prior to hospital admission, pt was living by himself in a home with a steep ramped entrance. Pt reports he has very limited access to transportation provided by sister and a friend and has tried Biomedical scientist with limited success in the past. He reports he stays at home for months at a time which impacts his depression and drinking. He reports that he has been having increased difficulty with self care tasks including bathing, dressing, and toileting. It's taking him increased time and effort with a need to rest afterwards. Pt endorses feeling depressed and cites not wanting to be here anymore as the reason he stopped taking his medications. Pt shares that he wants to be more independent, get back to walking with his prosthesis, and wants to be able to get out into the community more often. Pt would benefit from skilled OT services to address noted impairments and functional limitations (see below for any additional details) in order to maximize safety and independence while minimizing falls risk and caregiver burden. Anticipate the need for follow up OT services upon acute hospital DC.   If plan is discharge home, recommend the following:   Assistance with cooking/housework;Assist for transportation;Help with stairs or ramp for entrance     Functional Status Assessment   Patient has had a recent decline in their functional status and  demonstrates the ability to make significant improvements in function in a reasonable and predictable amount of time.     Equipment Recommendations   None recommended by OT      Precautions/Restrictions   Precautions Precautions: Fall Recall of Precautions/Restrictions: Intact Precaution/Restrictions Comments: L AKA Restrictions Weight Bearing Restrictions Per Provider Order: No     Mobility Bed Mobility Overal bed mobility: Modified Independent   Transfers Overall transfer level: Modified independent Equipment used: Pushed w/c  General transfer comment: squat pivot with mod indep, does not lock w/c breaks but reports this is his baseline    Balance Overall balance assessment: Mild deficits observed, not formally tested         ADL either performed or assessed with clinical judgement   ADL       General ADL Comments: Pt currently requires increased time/effort to complete bathing, dressing, and toileting tasks compared to baseline. Pt completed toileting during session wiht mod indep using urinal from w/c level and able to dispose of contents in the toilet himself afterwards. Bathing and dressing were not formally assessed at time of evaluation.         Extremity/Trunk Assessment Upper Extremity Assessment Upper Extremity Assessment: Generalized weakness   Lower Extremity Assessment Lower Extremity Assessment: Generalized weakness LLE Deficits / Details: L AKA   Cervical / Trunk Assessment Cervical / Trunk Assessment: Normal   Communication Communication Communication: No apparent difficulties   Cognition Arousal: Alert Behavior During Therapy: Flat affect Cognition: No family/caregiver present to determine baseline    OT - Cognition Comments: Pt alert and oriented, endorses feeling depressed and  cites not wanting to be here anymore as the reason he stopped taking his medications      Following commands: Intact          Exercises Other  Exercises Other Exercises: Pt educated in role of acute OT, recommendations, introduced concepts of energy conservation strategies to promote independence/safety while also attempting to prioritize meaningful occupational engagement.        Home Living Family/patient expects to be discharged to:: Private residence Living Arrangements: Alone Available Help at Discharge: Other (Comment);Personal care attendant (very limited supports available/reliable per pt, PCA 7d/wk for a few hours per day) Type of Home: House Home Access: Ramped entrance (pt reports ramp is steeper; uses B railings; is getting harder to do on own)     Home Layout: Two level;Able to live on main level with bedroom/bathroom     Bathroom Shower/Tub: Producer, television/film/video: Handicapped height     Home Equipment: Shower seat - built in;Other (comment);Crutches;Rolling Walker (2 wheels)   Additional Comments: Has larger bathroom.  Uses tub bench.  Has 3 transport chairs and 2 manual w/c's.  Sleeps in recliner.  Wife passed about 1 year ago.      Prior Functioning/Environment Prior Level of Function : Needs assist    Mobility Comments: Pt reports he has been using manual w/c mainly for mobility; last used prosthesis about 1 month ago but has difficulty using d/t L hip pops out (reports using crutches is easier than using RW with prosthesis; doesn't attempt to walk without prosthesis); unable to get to OP PT for prosthetic training d/t transportation issues ADLs Comments: MOW M-F.  Has home care 7x/week (assist for cooking/cleaning/laundry); 3 hours each day but 2 hours on Sunday.    OT Problem List: Decreased strength;Decreased activity tolerance;Decreased knowledge of use of DME or AE;Obesity   OT Treatment/Interventions: Self-care/ADL training;Therapeutic exercise;Therapeutic activities;Energy conservation;DME and/or AE instruction;Patient/family education      OT Goals(Current goals can be found in  the care plan section)   Acute Rehab OT Goals Patient Stated Goal: get stronger, walk with a prosthesis again, and be able to get out of the house OT Goal Formulation: With patient Time For Goal Achievement: 11/23/23 Potential to Achieve Goals: Good ADL Goals Additional ADL Goal #1: Pt will verbalize plan to implement at least 2 learned ECS and/or compensatory strategies for ADL to improve safety/indep upon return home. Additional ADL Goal #2: Pt will identify at least 1 provided resource to contact for more information to support increasing his independence and community access.   OT Frequency:  Min 1X/week       AM-PAC OT 6 Clicks Daily Activity     Outcome Measure Help from another person eating meals?: None Help from another person taking care of personal grooming?: None Help from another person toileting, which includes using toliet, bedpan, or urinal?: None Help from another person bathing (including washing, rinsing, drying)?: None Help from another person to put on and taking off regular upper body clothing?: None Help from another person to put on and taking off regular lower body clothing?: None 6 Click Score: 24   End of Session    Activity Tolerance: Patient tolerated treatment well Patient left: Other (comment) (in w/c leaving his bedroom)  OT Visit Diagnosis: Other abnormalities of gait and mobility (R26.89);Muscle weakness (generalized) (M62.81);Other (comment) (depression)                Time: 8467-8450 OT Time Calculation (min): 17 min Charges:  OT General Charges $OT Visit: 1 Visit OT Evaluation $OT Eval Low Complexity: 1 Low  Warren SAUNDERS., MPH, MS, OTR/L ascom [phone] 11/09/23, 4:29 PM

## 2023-11-09 NOTE — Tx Team (Signed)
 Initial Treatment Plan 11/09/2023 3:22 AM Charles [name] FMW:[id number]    PATIENT STRESSORS: Financial difficulties   Health problems   Loss of wife   Substance abuse     PATIENT STRENGTHS: Communication skills  Motivation for treatment/growth    PATIENT IDENTIFIED PROBLEMS: loss of his wife  Has no strong support systems  Left leg BKA, having difficulties care for himself independently                 DISCHARGE CRITERIA:  Ability to meet basic life and health needs Improved stabilization in mood, thinking, and/or behavior Motivation to continue treatment in a less acute level of care Safe-care adequate arrangements made Withdrawal symptoms are absent or subacute and managed without 24-hour nursing intervention  PRELIMINARY DISCHARGE PLAN: Attend aftercare/continuing care group Attend 12-step recovery group  PATIENT/FAMILY INVOLVEMENT: This treatment plan has been presented to and reviewed with the patient, Charles [name]. The patient have been given the opportunity to ask questions and make suggestions.  Cooper LOISE Lent, RN 11/09/2023, 3:22 AM

## 2023-11-09 NOTE — Group Note (Signed)
 Date:  11/09/2023 Time:  9:24 PM  Group Topic/Focus:  Wrap-Up Group:   The focus of this group is to help patients review their daily goal of treatment and discuss progress on daily workbooks.    Participation Level:  Active  Participation Quality:  Appropriate  Affect:  Appropriate  Cognitive:  Alert  Insight: Appropriate  Engagement in Group:  Engaged  Modes of Intervention:  Discussion  Additional Comments:    Charles [name] 11/09/2023, 9:24 PM

## 2023-11-09 NOTE — [facility] Suicide Risk Assessment (Signed)
 [facility] Admission Suicide Risk Assessment   Nursing information obtained from:  Patient Demographic factors:  Divorced or widowed Current Mental Status:  Suicidal ideation indicated by patient Loss Factors:  Loss of significant relationship Historical Factors:  NA Risk Reduction Factors:  NA  Total Time spent with patient: 30 minutes Principal Problem: MDD (major depressive disorder), recurrent severe, without psychosis (HCC) Diagnosis:  Principal Problem:   MDD (major depressive disorder), recurrent severe, without psychosis (HCC)  Subjective Data: Charles [name] is a 63 y.o. male admitted: Presented to the ED Patient is a 63 year old male presenting to [facility] ED voluntarily. Per triage note Pt presented to ED POV brought in by a friend stating I hate myself. States has been having these feelings x 2 weeks. Friend states pt drinks 1 gallon of liquor a day. Friend states he has been expressing SI x 2 days stating I'm done, I cannot do this much long. Pt states wife died a year ago. Friend also states pt has been very hostile .  Psychiatry is consulted for safety evaluation Patient is admitted to [facility] unit with Q15 min safety monitoring. Multidisciplinary team approach is offered. Medication management; group/milieu therapy is offered.   Continued Clinical Symptoms:  Alcohol  Use Disorder Identification Test Final Score (AUDIT): 28 The Alcohol  Use Disorders Identification Test, Guidelines for Use in Primary Care, Second Edition.  World Science writer [facility]). Score between 0-7:  no or low risk or alcohol  related problems. Score between 8-15:  moderate risk of alcohol  related problems. Score between 16-19:  high risk of alcohol  related problems. Score 20 or above:  warrants further diagnostic evaluation for alcohol  dependence and treatment.   CLINICAL FACTORS:   Depression:   Hopelessness   Musculoskeletal: Strength & Muscle Tone: within normal limits Gait & Station:  normal Patient leans: N/A  Psychiatric Specialty Exam:  Presentation  General Appearance:  Appropriate for Environment; Casual  Eye Contact: Fair  Speech: Clear and Coherent  Speech Volume: Normal  Handedness: Right   Mood and Affect  Mood: Depressed; Dysphoric  Affect: Depressed; Flat   Thought Process  Thought Processes: Coherent  Descriptions of Associations:Intact  Orientation:Full (Time, Place and Person)  Thought Content:Logical  History of Schizophrenia/Schizoaffective disorder:No  Duration of Psychotic Symptoms:No data recorded Hallucinations:Hallucinations: None  Ideas of Reference:None  Suicidal Thoughts:Suicidal Thoughts: Yes, Passive SI Passive Intent and/or Plan: Without Intent; Without Plan  Homicidal Thoughts:Homicidal Thoughts: No   Sensorium  Memory: Immediate Fair; Recent Fair; Remote Fair  Judgment: Impaired  Insight: Shallow   Executive Functions  Concentration: Fair  Attention Span: Fair  Recall: [employer] of Knowledge: Fair  Language: Fair   Psychomotor Activity  Psychomotor Activity: Psychomotor Activity: Normal   Assets  Assets: Communication Skills; Desire for Improvement; Resilience; Social Support   Sleep  Sleep: Sleep: Fair    Physical Exam: Physical Exam Vitals and nursing note reviewed.    ROS Blood pressure 118/85, pulse 86, temperature 99.1 F (37.3 C), temperature source Oral, resp. rate 17, height 5' 8 (1.727 m), weight 113.4 kg, SpO2 98%. Body mass index is 38.01 kg/m.   COGNITIVE FEATURES THAT CONTRIBUTE TO RISK:  None    SUICIDE RISK:   Minimal: No identifiable suicidal ideation.  Patients presenting with no risk factors but with morbid ruminations; may be classified as minimal risk based on the severity of the depressive symptoms  PLAN OF CARE: Patient is admitted to [facility] psych unit with Q15 min safety monitoring. Multidisciplinary team approach  is offered.  Medication management; group/milieu therapy is offered.   I certify that inpatient services furnished can reasonably be expected to improve the patient's condition.   Allyn Foil, MD 11/09/2023, 4:05 PM

## 2023-11-09 NOTE — Group Note (Signed)
 Date:  11/09/2023 Time:  9:40 PM  Group Topic/Focus:  Wrap-Up Group:   The focus of this group is to help patients review their daily goal of treatment and discuss progress on daily workbooks.    Participation Level:  Active  Participation Quality:  Appropriate  Affect:  Appropriate  Cognitive:  Appropriate  Insight: Appropriate  Engagement in Group:  Engaged  Modes of Intervention:  Discussion  Additional Comments:    Charles [name] Charles [name] 11/09/2023, 9:40 PM

## 2023-11-09 NOTE — Evaluation (Signed)
 Physical Therapy Evaluation Patient Details Name: Charles [name] MRN: [id number] DOB: 04/20/1961 Today's Date: 11/09/2023  History of Present Illness  Pt is a 63 y.o. male presenting to hospital 11/07/23 with c/o suicidal thoughts; feeling more depressed over past 2 weeks; drinks alcohol  every day.  PMH includes ADD, anginal pain, anxiety, BPH, chronic back pain, back sx, CAD, gout, MI, neuropathy, sleep apnea, DM, R achilles tendon sx, L AKA, R toe amputation, gastric bypass, spinal cord stimulator.  Clinical Impression  Prior to recent medical concerns, pt reports being modified independent with w/c level transfers/functional mobility; last used prosthesis about 1 month ago but doesn't consistently use d/t reported difficulties (pt stopped going to OP PT for prosthetic training d/t transportation issues but is interested in HHPT to work on prosthetic training); lives alone on main level of home with ramp to enter.  Currently pt is modified independent with manual w/c propulsion 50 feet with B UE's and R LE and also modified independent with squat pivot transfer manual w/c to bed.  No acute PT needs identified (pt does not have prosthesis present) but pt would benefit from HHPT for prosthetic training d/t transportation issues.     If plan is discharge home, recommend the following: Assistance with cooking/housework;Assist for transportation;Help with stairs or ramp for entrance (assist for prosthetic training (walking))   Can travel by private vehicle        Equipment Recommendations None recommended by PT (pt has needed DME at home already)  Recommendations for Other Services       Functional Status Assessment Patient has not had a recent decline in their functional status     Precautions / Restrictions Precautions Precautions: Fall Recall of Precautions/Restrictions: Intact Precaution/Restrictions Comments: L AKA Restrictions Weight Bearing Restrictions Per Provider Order: No       Mobility  Bed Mobility               General bed mobility comments: Deferred (pt reports no concerns)    Transfers Overall transfer level: Modified independent Equipment used:  (Manual w/c)               General transfer comment: Squat pivot manual w/c to bed without any difficulties (pt did not lock w/c brakes but pt reports he never does even at home and has no issues)    Ambulation/Gait               General Gait Details: Deferred (pt does not have prosthesis present and pt reports he does not try to walk without prosthesis)  Stairs            Wheelchair Mobility     Tilt Bed    Modified Rankin (Stroke Patients Only)       Balance Overall balance assessment: Needs assistance Sitting-balance support: No upper extremity supported (R LE supported) Sitting balance-Leahy Scale: Normal Sitting balance - Comments: steady reaching outside BOS                                     Pertinent Vitals/Pain Pain Assessment Pain Assessment: 0-10 Pain Score: 8  Pain Location: R flank and LBP (chronic) Pain Descriptors / Indicators: Constant, Discomfort Pain Intervention(s): Limited activity within patient's tolerance, Monitored during session, Repositioned, Patient requesting pain meds-RN notified, RN gave pain meds during session    Home Living Family/patient expects to be discharged to:: Private residence Living Arrangements: Alone Available Help  at Discharge: Other (Comment);Personal care attendant (very limited supports available/reliable per pt, PCA 7d/wk for a few hours per day) Type of Home: House Home Access: Ramped entrance (pt reports ramp is steeper; uses B railings; is getting harder to do on own)       Home Layout: Two level;Able to live on main level with bedroom/bathroom Home Equipment: Shower seat - built in;Other (comment);Crutches;Rolling Walker (2 wheels) Additional Comments: Has larger bathroom.  Uses tub bench.   Has 3 transport chairs and 2 manual w/c's.  Sleeps in recliner.  Wife passed about 1 year ago.    Prior Function Prior Level of Function : Needs assist             Mobility Comments: Pt reports he has been using manual w/c mainly for mobility; last used prosthesis about 1 month ago but has difficulty using d/t L hip pops out (reports using crutches is easier than using RW with prosthesis; doesn't attempt to walk without prosthesis); unable to get to OP PT for prosthetic training d/t transportation issues ADLs Comments: MOW M-F.  Has home care 7x/week (assist for cooking/cleaning/laundry); 3 hours each day but 2 hours on Sunday.     Extremity/Trunk Assessment   Upper Extremity Assessment Upper Extremity Assessment: Generalized weakness    Lower Extremity Assessment Lower Extremity Assessment: Generalized weakness LLE Deficits / Details: L AKA    Cervical / Trunk Assessment Cervical / Trunk Assessment: Normal  Communication   Communication Communication: No apparent difficulties    Cognition Arousal: Alert Behavior During Therapy: Flat affect   PT - Cognitive impairments: No apparent impairments                         Following commands: Intact       Cueing Cueing Techniques: Verbal cues     General Comments  Nursing cleared pt for participation in physical therapy.  Pt agreeable to PT session.    Exercises     Assessment/Plan    PT Assessment All further PT needs can be met in the next venue of care (Recommending HHPT for prosthesis training)  PT Problem List Decreased mobility;Pain       PT Treatment Interventions      PT Goals (Current goals can be found in the Care Plan section)  Acute Rehab PT Goals Patient Stated Goal: to improve walking with prosthesis PT Goal Formulation: With patient Time For Goal Achievement: 11/23/23 Potential to Achieve Goals: Good    Frequency       Co-evaluation               AM-PAC PT 6 Clicks  Mobility  Outcome Measure Help needed turning from your back to your side while in a flat bed without using bedrails?: None Help needed moving from lying on your back to sitting on the side of a flat bed without using bedrails?: None Help needed moving to and from a bed to a chair (including a wheelchair)?: None Help needed standing up from a chair using your arms (e.g., wheelchair or bedside chair)?: None Help needed to walk in hospital room?: A Lot Help needed climbing 3-5 steps with a railing? : Total 6 Click Score: 19    End of Session   Activity Tolerance: Patient tolerated treatment well Patient left:  (Sitting on EOB with OT present) Nurse Communication: Mobility status;Patient requests pain meds;Precautions PT Visit Diagnosis: Other abnormalities of gait and mobility (R26.89);Pain Pain - Right/Left: Right Pain -  part of body:  (flank and LBP)    Time: 8494-8468 PT Time Calculation (min) (ACUTE ONLY): 26 min   Charges:   PT Evaluation $PT Eval Low Complexity: 1 Low   PT General Charges $$ ACUTE PT VISIT: 1 Visit        Damien Caulk, PT 11/09/23, 4:44 PM

## 2023-11-09 NOTE — Plan of Care (Signed)
  Problem: Education: Goal: Ability to make informed decisions regarding treatment will improve Outcome: Not Met (add Reason)   Problem: Coping: Goal: Coping ability will improve Outcome: Not Met (add Reason)   Problem: Health Behavior/Discharge Planning: Goal: Identification of resources available to assist in meeting health care needs will improve Outcome: Not Met (add Reason)   Problem: Medication: Goal: Compliance with prescribed medication regimen will improve Outcome: Not Met (add Reason)   Problem: Self-Concept: Goal: Ability to disclose and discuss suicidal ideas will improve Outcome: Not Met (add Reason) Goal: Will verbalize positive feelings about self Outcome: Not Met (add Reason) Note: Patient is not on track and no change. Patient will work on increased adherence    Problem: Education: Goal: Utilization of techniques to improve thought processes will improve Outcome: Not Met (add Reason) Goal: Knowledge of the prescribed therapeutic regimen will improve Outcome: Not Met (add Reason)   Problem: Coping: Goal: Coping ability will improve Outcome: Not Met (add Reason) Goal: Will verbalize feelings Outcome: Not Met (add Reason)   Problem: Health Behavior/Discharge Planning: Goal: Ability to make decisions will improve Outcome: Not Met (add Reason) Goal: Compliance with therapeutic regimen will improve Outcome: Not Met (add Reason)   Problem: Safety: Goal: Ability to disclose and discuss suicidal ideas will improve Outcome: Not Met (add Reason) Goal: Ability to identify and utilize support systems that promote safety will improve Outcome: Not Met (add Reason)   Problem: Education: Goal: Ability to make informed decisions regarding treatment will improve Outcome: Not Met (add Reason)   Problem: Coping: Goal: Coping ability will improve Outcome: Not Met (add Reason)   Problem: Health Behavior/Discharge Planning: Goal: Identification of resources available  to assist in meeting health care needs will improve Outcome: Not Met (add Reason)   Problem: Education: Goal: Knowledge of [facility] Education information/materials will improve Outcome: Not Met (add Reason) Goal: Emotional status will improve Outcome: Not Met (add Reason) Goal: Mental status will improve Outcome: Not Met (add Reason) Goal: Verbalization of understanding the information provided will improve Outcome: Not Met (add Reason)   Problem: Activity: Goal: Interest or engagement in activities will improve Outcome: Not Met (add Reason) Goal: Sleeping patterns will improve Outcome: Not Met (add Reason)   Problem: Coping: Goal: Ability to verbalize frustrations and anger appropriately will improve Outcome: Not Met (add Reason) Goal: Ability to demonstrate self-control will improve Outcome: Not Met (add Reason)   Problem: Health Behavior/Discharge Planning: Goal: Identification of resources available to assist in meeting health care needs will improve Outcome: Not Met (add Reason) Goal: Compliance with treatment plan for underlying cause of condition will improve Outcome: Not Met (add Reason)   Problem: Physical Regulation: Goal: Ability to maintain clinical measurements within normal limits will improve Outcome: Not Met (add Reason)   Problem: Safety: Goal: Periods of time without injury will increase Outcome: Not Met (add Reason)

## 2023-11-10 DIAGNOSIS — F332 Major depressive disorder, recurrent severe without psychotic features: Secondary | ICD-10-CM | POA: Diagnosis not present

## 2023-11-10 NOTE — Plan of Care (Signed)
  Problem: Education: Goal: Ability to make informed decisions regarding treatment will improve Outcome: Progressing   Problem: Coping: Goal: Coping ability will improve Outcome: Progressing   Problem: Health Behavior/Discharge Planning: Goal: Identification of resources available to assist in meeting health care needs will improve Outcome: Progressing   Problem: Medication: Goal: Compliance with prescribed medication regimen will improve Outcome: Progressing   Problem: Self-Concept: Goal: Ability to disclose and discuss suicidal ideas will improve Outcome: Progressing Goal: Will verbalize positive feelings about self Outcome: Progressing Note: Patient is on track. Patient will maintain adherence, adhere to provider and/or lab appointments, avoid flare triggers, and be evaluated at upcoming provider appointment to assess progress    Problem: Education: Goal: Utilization of techniques to improve thought processes will improve Outcome: Progressing Goal: Knowledge of the prescribed therapeutic regimen will improve Outcome: Progressing   Problem: Coping: Goal: Coping ability will improve Outcome: Progressing Goal: Will verbalize feelings Outcome: Progressing   Problem: Health Behavior/Discharge Planning: Goal: Ability to make decisions will improve Outcome: Progressing Goal: Compliance with therapeutic regimen will improve Outcome: Progressing   Problem: Safety: Goal: Ability to disclose and discuss suicidal ideas will improve Outcome: Progressing Goal: Ability to identify and utilize support systems that promote safety will improve Outcome: Progressing   Problem: Education: Goal: Ability to make informed decisions regarding treatment will improve Outcome: Progressing   Problem: Coping: Goal: Coping ability will improve Outcome: Progressing   Problem: Health Behavior/Discharge Planning: Goal: Identification of resources available to assist in meeting health care  needs will improve Outcome: Progressing   Problem: Education: Goal: Knowledge of [facility] Education information/materials will improve Outcome: Progressing Goal: Emotional status will improve Outcome: Progressing Goal: Mental status will improve Outcome: Progressing Goal: Verbalization of understanding the information provided will improve Outcome: Progressing   Problem: Activity: Goal: Interest or engagement in activities will improve Outcome: Progressing Goal: Sleeping patterns will improve Outcome: Progressing   Problem: Coping: Goal: Ability to verbalize frustrations and anger appropriately will improve Outcome: Progressing Goal: Ability to demonstrate self-control will improve Outcome: Progressing   Problem: Health Behavior/Discharge Planning: Goal: Identification of resources available to assist in meeting health care needs will improve Outcome: Progressing Goal: Compliance with treatment plan for underlying cause of condition will improve Outcome: Progressing   Problem: Physical Regulation: Goal: Ability to maintain clinical measurements within normal limits will improve Outcome: Progressing   Problem: Safety: Goal: Periods of time without injury will increase Outcome: Progressing

## 2023-11-10 NOTE — Group Note (Signed)
 Date:  11/10/2023 Time:  8:56 PM  Group Topic/Focus:  Early Warning Signs:   The focus of this group is to help patients identify signs or symptoms they exhibit before slipping into an unhealthy state or crisis.  MHT Francis reviewed 15 minute rounds, explaining they would occur every 15 minutes. MHT set expectations for night rounding and informed patients not to be alarmed if they see someone looking in on them. MHT informed of the reason 15 minute rounds were conducted. MHT addressed concerns about hearing the clicking sound of doors throughout the night.  MHT Francis discussed the importance of communication with providers. MHT informed patients they needed to communicate with providers what their triggers were. MHT explained this would allow outpatient providers to help develop a crisis plan to help avoid the triggers.  MHT Francis encouraged patients to inform outpatient providers of the warning signs when they were not doing well. MHT explained this would help the provider identify their time of need and start the process of an appropriate intervention. MHT encouraged open communication with providers.  MHT Francis discussed dealing with stress. MHT encouraged patients to do things they enjoy doing to keep stress at a minimum. MHT informed group that stress can lead to burn out. MHT explained how stress can affect a person mentally and physically. MHT informed patients if they find they are not doing the things they enjoy, that could be a sign of burn out. MHT explained that by actually doing the things they enjoy, they could help reduce their stress. MHT provided examples of things to do to help keep stress at a minimum. Group listed exercise, reading, walking, playing games, playing with grandchildren, and going shopping.   MHT Francis opened group up for questions and concerns.     Participation Level:  Active  Participation Quality:  Appropriate  Affect:  Appropriate  Cognitive:   Appropriate  Insight: Appropriate  Engagement in Group:  Engaged  Modes of Intervention:  Discussion  Additional Comments:    Francis JONETTA Boos 11/10/2023, 8:56 PM

## 2023-11-10 NOTE — Progress Notes (Signed)
   11/10/23 1100  Psych Admission Type (Psych Patients Only)  Admission Status Voluntary  Psychosocial Assessment  Patient Complaints Depression  Eye Contact Fair  Facial Expression Sad  Affect Appropriate to circumstance  Speech Logical/coherent  Interaction Minimal  Motor Activity Slow  Appearance/Hygiene In scrubs  Behavior Characteristics Cooperative  Mood Pleasant  Thought Process  Coherency WDL  Content WDL  Delusions None reported or observed  Perception WDL  Hallucination None reported or observed  Judgment Impaired  Confusion Mild  Danger to Self  Current suicidal ideation? Denies  Self-Injurious Behavior No self-injurious ideation or behavior indicators observed or expressed   Agreement Not to Harm Self Yes  Description of Agreement verbal  Danger to Others  Danger to Others None reported or observed

## 2023-11-10 NOTE — BH IP Treatment Plan (Signed)
 Interdisciplinary Treatment and Diagnostic Plan Update  11/10/2023 Time of Session: 10:26 AM  Charles [name] MRN: [id number]  Principal Diagnosis: MDD (major depressive disorder), recurrent severe, without psychosis (HCC)  Secondary Diagnoses: Principal Problem:   MDD (major depressive disorder), recurrent severe, without psychosis (HCC)   Current Medications:  Current Facility-Administered Medications  Medication Dose Route Frequency Provider Last Rate Last Admin   acetaminophen  (TYLENOL ) tablet 650 mg  650 mg Oral Q6H PRN Jadapalle, Sree, MD   650 mg at 11/10/23 0002   albuterol  (VENTOLIN  HFA) 108 (90 Base) MCG/ACT inhaler 2 puff  2 puff Inhalation Q6H PRN Jadapalle, Sree, MD       alum & mag hydroxide-simeth (MAALOX/MYLANTA) 200-200-20 MG/5ML suspension 30 mL  30 mL Oral Q4H PRN Jadapalle, Sree, MD       amLODipine  (NORVASC ) tablet 5 mg  5 mg Oral Daily Jadapalle, Sree, MD   5 mg at 11/10/23 0956   atorvastatin  (LIPITOR ) tablet 40 mg  40 mg Oral Daily Jadapalle, Sree, MD   40 mg at 11/10/23 9043   buPROPion  (WELLBUTRIN  XL) 24 hr tablet 300 mg  300 mg Oral Daily Jadapalle, Sree, MD   300 mg at 11/10/23 0957   carvedilol  (COREG ) tablet 3.125 mg  3.125 mg Oral BID WC Jadapalle, Sree, MD   3.125 mg at 11/10/23 0956   cholecalciferol  (VITAMIN D3) 25 MCG (1000 UNIT) tablet 2,000 Units  2,000 Units Oral Daily Jadapalle, Sree, MD   2,000 Units at 11/10/23 0954   DULoxetine  (CYMBALTA ) DR capsule 60 mg  60 mg Oral Daily Jadapalle, Sree, MD   60 mg at 11/10/23 9044   furosemide  (LASIX ) tablet 20 mg  20 mg Oral Daily Jadapalle, Sree, MD   20 mg at 11/10/23 9043   hydrOXYzine  (ATARAX ) tablet 25 mg  25 mg Oral Q6H PRN Jadapalle, Sree, MD   25 mg at 11/08/23 2322   levothyroxine  (SYNTHROID ) tablet 75 mcg  75 mcg Oral Q0600 Jadapalle, Sree, MD   75 mcg at 11/10/23 0650   LORazepam  (ATIVAN ) injection 0-4 mg  0-4 mg Intravenous Q12H Donnelly Mellow, MD       Or   LORazepam  (ATIVAN ) tablet 0-4 mg  0-4  mg Oral Q12H Jadapalle, Sree, MD       magnesium  hydroxide (MILK OF MAGNESIA) suspension 30 mL  30 mL Oral Daily PRN Donnelly Mellow, MD       OLANZapine  (ZYPREXA ) injection 5 mg  5 mg Intramuscular TID PRN Jadapalle, Sree, MD       OLANZapine  zydis (ZYPREXA ) disintegrating tablet 5 mg  5 mg Oral TID PRN Jadapalle, Sree, MD       thiamine  (VITAMIN B1) tablet 100 mg  100 mg Oral Daily Jadapalle, Sree, MD   100 mg at 11/10/23 9043   Or   thiamine  (VITAMIN B1) injection 100 mg  100 mg Intravenous Daily Jadapalle, Sree, MD       traZODone  (DESYREL ) tablet 50 mg  50 mg Oral QHS PRN Jadapalle, Sree, MD   50 mg at 11/10/23 0140   PTA Medications: Medications Prior to Admission  Medication Sig Dispense Refill Last Dose/Taking   albuterol  (PROVENTIL  HFA;VENTOLIN  HFA) 108 (90 Base) MCG/ACT inhaler Inhale 2 puffs into the lungs every 6 (six) hours as needed for wheezing or shortness of breath. 1 Inhaler 5    amLODipine  (NORVASC ) 5 MG tablet Take 1 tablet (5 mg total) by mouth daily. 30 tablet 3    ascorbic acid  (VITAMIN C ) 500 MG tablet  Take 1 tablet (500 mg total) by mouth daily. 30 tablet 0    atorvastatin  (LIPITOR ) 40 MG tablet TAKE ONE TABLET BY MOUTH AT BEDTIME FOR CHLOESTEROL 90 tablet 1    buPROPion  (WELLBUTRIN  XL) 300 MG 24 hr tablet Take 1 tablet (300 mg total) by mouth daily. 30 tablet 0    carvedilol  (COREG ) 3.125 MG tablet Take 1 tablet (3.125 mg total) by mouth 2 (two) times daily with a meal. 30 tablet 3    cholecalciferol  (VITAMIN D ) 25 MCG tablet Take 2 tablets (2,000 Units total) by mouth daily. 60 tablet 0    DULoxetine  (CYMBALTA ) 60 MG capsule Take 1 capsule (60 mg total) by mouth daily. 30 capsule 0    furosemide  (LASIX ) 20 MG tablet TAKE ONE TABLET BY MOUTH EVERY DAY AS NEEDED FOR SWELLING 30 tablet 3    hydrOXYzine  (ATARAX ) 25 MG tablet Take 1 tablet (25 mg total) by mouth 3 (three) times daily as needed for anxiety. 30 tablet 0    levothyroxine  (SYNTHROID ) 75 MCG tablet Take 1  tablet (75 mcg total) by mouth daily at 6 (six) AM. 30 tablet 3    lisdexamfetamine (VYVANSE ) 40 MG capsule TAKE 1 CAPSULE BY MOUTH EVERY MORNING 30 capsule 0    risperiDONE  (RISPERDAL ) 0.5 MG tablet Take 1 tablet (0.5 mg total) by mouth 2 (two) times daily at 8 am and 4 pm. 60 tablet 0    sildenafil  (REVATIO ) 20 MG tablet TAKE 1 TABLET BY MOUTH DAILY AS NEEDED FOR ERECTILE DYSFUNCTION 30 tablet 1    zinc  sulfate 220 (50 Zn) MG capsule Take 1 capsule (220 mg total) by mouth daily. 30 capsule 0    zolpidem  (AMBIEN ) 10 MG tablet TAKE ONE TABLET AT BEDTIME IF NEEDED FORSLEEP 30 tablet 2     Patient Stressors: Financial difficulties   Health problems   Loss of wife   Substance abuse    Patient Strengths: Printmaker for treatment/growth   Treatment Modalities: Medication Management, Group therapy, Case management,  1 to 1 session with clinician, Psychoeducation, Recreational therapy.   Physician Treatment Plan for Primary Diagnosis: MDD (major depressive disorder), recurrent severe, without psychosis (HCC) Long Term Goal(s): Improvement in symptoms so as ready for discharge   Short Term Goals: Ability to identify changes in lifestyle to reduce recurrence of condition will improve Ability to verbalize feelings will improve Ability to disclose and discuss suicidal ideas Ability to demonstrate self-control will improve Ability to identify and develop effective coping behaviors will improve  Medication Management: Evaluate patient's response, side effects, and tolerance of medication regimen.  Therapeutic Interventions: 1 to 1 sessions, Unit Group sessions and Medication administration.  Evaluation of Outcomes: Not Progressing  Physician Treatment Plan for Secondary Diagnosis: Principal Problem:   MDD (major depressive disorder), recurrent severe, without psychosis (HCC)  Long Term Goal(s): Improvement in symptoms so as ready for discharge   Short Term Goals:  Ability to identify changes in lifestyle to reduce recurrence of condition will improve Ability to verbalize feelings will improve Ability to disclose and discuss suicidal ideas Ability to demonstrate self-control will improve Ability to identify and develop effective coping behaviors will improve     Medication Management: Evaluate patient's response, side effects, and tolerance of medication regimen.  Therapeutic Interventions: 1 to 1 sessions, Unit Group sessions and Medication administration.  Evaluation of Outcomes: Not Progressing   RN Treatment Plan for Primary Diagnosis: MDD (major depressive disorder), recurrent severe, without psychosis (HCC) Long Term Goal(s):  Knowledge of disease and therapeutic regimen to maintain health will improve  Short Term Goals: Ability to remain free from injury will improve, Ability to verbalize frustration and anger appropriately will improve, Ability to demonstrate self-control, Ability to participate in decision making will improve, Ability to verbalize feelings will improve, Ability to disclose and discuss suicidal ideas, Ability to identify and develop effective coping behaviors will improve, and Compliance with prescribed medications will improve  Medication Management: RN will administer medications as ordered by provider, will assess and evaluate patient's response and provide education to patient for prescribed medication. RN will report any adverse and/or side effects to prescribing provider.  Therapeutic Interventions: 1 on 1 counseling sessions, Psychoeducation, Medication administration, Evaluate responses to treatment, Monitor vital signs and CBGs as ordered, Perform/monitor CIWA, COWS, AIMS and Fall Risk screenings as ordered, Perform wound care treatments as ordered.  Evaluation of Outcomes: Not Progressing   LCSW Treatment Plan for Primary Diagnosis: MDD (major depressive disorder), recurrent severe, without psychosis (HCC) Long Term  Goal(s): Safe transition to appropriate next level of care at discharge, Engage patient in therapeutic group addressing interpersonal concerns.  Short Term Goals: Engage patient in aftercare planning with referrals and resources, Increase social support, Increase ability to appropriately verbalize feelings, Increase emotional regulation, Facilitate acceptance of mental health diagnosis and concerns, Facilitate patient progression through stages of change regarding substance use diagnoses and concerns, Identify triggers associated with mental health/substance abuse issues, and Increase skills for wellness and recovery  Therapeutic Interventions: Assess for all discharge needs, 1 to 1 time with Social worker, Explore available resources and support systems, Assess for adequacy in community support network, Educate family and significant other(s) on suicide prevention, Complete Psychosocial Assessment, Interpersonal group therapy.  Evaluation of Outcomes: Not Progressing   Progress in Treatment: Attending groups: Yes. and No. Participating in groups: Yes. and No. Taking medication as prescribed: Yes. Toleration medication: Yes. Family/Significant other contact made: No, will contact:  CSW will contact if given permission  Patient understands diagnosis: Yes. Discussing patient identified problems/goals with staff: Yes. Medical problems stabilized or resolved: Yes. Denies suicidal/homicidal ideation: Yes. Issues/concerns per patient self-inventory: No. Other: None   New problem(s) identified: No, Describe:  None identified   New Short Term/Long Term Goal(s):  elimination of symptoms of psychosis, medication management for mood stabilization; elimination of SI thoughts; development of comprehensive mental wellness/sobriety plan.   Patient Goals:  I just need some interaction, you know I sit at the house by myself all the time, I need to get back on my medications  Discharge Plan or Barriers:   CSW will assist with appropriate discharge planning   Reason for Continuation of Hospitalization: Depression Medication stabilization Withdrawal symptoms  Estimated Length of Stay: 1 to 7 days   Last 3 [place] Suicide Severity Risk Score: Flowsheet Row Admission (Current) from 11/08/2023 in [facility] BE[facility]AVIORAL MEDIC[facility] Most recent reading at 11/09/2023 12:00 AM ED from 11/08/2023 in [facility] Emergency Department at [facility] Most recent reading at 11/07/2023 11:56 PM ED from 07/31/2023 in [facility] Emergency Department at [facility] Most recent reading at 07/31/2023  5:21 PM  C-SSRS RISK CATEGORY High Risk High Risk No Risk    Last PHQ 2/9 Scores:    05/13/2023    9:22 AM 10/29/2022   10:14 AM 01/20/2022   10:49 AM  Depression screen PHQ 2/9  Decreased Interest 1 1 0  Down, Depressed, Hopeless 0 0 0  PHQ - 2 Score 1 1 0  Scribe for Treatment Team: Lum JONETTA Raynaldo ISRAEL 11/10/2023 2:43 PM

## 2023-11-10 NOTE — Progress Notes (Signed)
   11/10/23 2100  Psych Admission Type (Psych Patients Only)  Admission Status Voluntary  Psychosocial Assessment  Patient Complaints None  Eye Contact Fair  Facial Expression Flat  Affect Appropriate to circumstance;Flat  Speech Logical/coherent  Interaction Minimal  Motor Activity Slow  Appearance/Hygiene In scrubs  Behavior Characteristics Cooperative  Mood Pleasant  Thought Process  Coherency WDL  Content WDL  Delusions None reported or observed  Perception WDL  Hallucination None reported or observed  Judgment Impaired  Confusion Mild  Danger to Self  Current suicidal ideation? Denies  Self-Injurious Behavior No self-injurious ideation or behavior indicators observed or expressed   Agreement Not to Harm Self Yes  Description of Agreement verbal  Danger to Others  Danger to Others None reported or observed

## 2023-11-10 NOTE — Group Note (Signed)
 Date:  11/10/2023 Time:  12:01 PM  Group Topic/Focus:  Outside Rec/Music Therapy The purpose of this group is for patients to go out and get fresh air while participating in outside activities and socializing with other peers.    Participation Level:  Did Not Attend  Beatris ONEIDA Hasten 11/10/2023, 12:01 PM

## 2023-11-10 NOTE — Group Note (Signed)
 Physical/Occupational Therapy Group Note  Group Topic: Functional, Dynamic Balance   Group Date: 11/10/2023 Start Time: 1300 End Time: 1341 Facilitators: Carlise Stofer, Alm Hamilton, PT   Group Description: Group discussed impact of balance on safety and independence with functional tasks.  Identified and discussed any self-perceived balance deficits to personalize information.  Discussed and reviewed strategies to address/improve balance deficits: use of assist devices, activity pacing/energy conservation, environment/home safety modifications, focusing attention/minimizing distraction.  Reviewed and participated with standing LE therex designed to target dynamic balance reactions and LE strength/stability; provided handouts with HEP to be utilized outside of group time as appropriate.  Allowed time for questions and further discussion on any balance or mobility concerns/needs.  Therapeutic Goal(s):  Identify and discuss any individual balance deficits and functional implications. Identify and discuss any environmental/home safety modifications that can optimize balance and safety for mobility within the home. Demonstrate understanding and performance of standing therex designed to target dynamic balance deficits.  Individual Participation: Pt did not attend  Participation Level:   Participation Quality:   Behavior:   Speech/Thought Process:   Affect/Mood:   Insight:   Judgement:   Modes of Intervention:   Patient Response to Interventions:    Plan: Continue to engage patient in PT/OT groups 1 - 2x/week.  CHARM Hamilton Bertin PT, DPT 11/10/23, 3:33 PM

## 2023-11-10 NOTE — Progress Notes (Deleted)
   11/09/23 2100  Psych Admission Type (Psych Patients Only)  Admission Status Voluntary  Psychosocial Assessment  Patient Complaints Restlessness  Eye Contact Fair  Facial Expression Animated  Affect Appropriate to circumstance  Speech Logical/coherent  Interaction Assertive  Motor Activity Slow  Appearance/Hygiene Disheveled  Behavior Characteristics Appropriate to situation;Cooperative;Restless  Mood Pleasant  Thought Process  Coherency WDL  Content WDL  Delusions None reported or observed  Perception WDL  Hallucination None reported or observed  Judgment WDL  Confusion Mild  Danger to Self  Current suicidal ideation? Denies  Self-Injurious Behavior No self-injurious ideation or behavior indicators observed or expressed   Agreement Not to Harm Self Yes  Description of Agreement verbal  Danger to Others  Danger to Others None reported or observed

## 2023-11-10 NOTE — Plan of Care (Signed)
   Problem: Coping: Goal: Coping ability will improve Outcome: Progressing   Problem: Medication: Goal: Compliance with prescribed medication regimen will improve Outcome: Progressing

## 2023-11-10 NOTE — Progress Notes (Signed)
   11/09/23 2100  Psych Admission Type (Psych Patients Only)  Admission Status Voluntary  Psychosocial Assessment  Patient Complaints Restlessness  Eye Contact Fair  Facial Expression Animated  Affect Appropriate to circumstance  Speech Logical/coherent  Interaction Assertive  Motor Activity Slow  Appearance/Hygiene Disheveled  Behavior Characteristics Appropriate to situation;Cooperative;Restless  Mood Pleasant  Thought Process  Coherency WDL  Content WDL  Delusions None reported or observed  Perception WDL  Hallucination None reported or observed  Judgment Impaired  Confusion Mild  Danger to Self  Current suicidal ideation? Denies  Self-Injurious Behavior No self-injurious ideation or behavior indicators observed or expressed   Agreement Not to Harm Self Yes  Description of Agreement verbal  Danger to Others  Danger to Others None reported or observed
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
## 2023-11-10 NOTE — Plan of Care (Signed)
  Problem: Education: Goal: Ability to make informed decisions regarding treatment will improve Outcome: Progressing   Problem: Coping: Goal: Coping ability will improve Outcome: Progressing   Problem: Health Behavior/Discharge Planning: Goal: Identification of resources available to assist in meeting health care needs will improve Outcome: Progressing   Problem: Medication: Goal: Compliance with prescribed medication regimen will improve Outcome: Progressing   Problem: Self-Concept: Goal: Ability to disclose and discuss suicidal ideas will improve Outcome: Progressing Goal: Will verbalize positive feelings about self Outcome: Progressing Note: Patient is on track. Patient will maintain adherence, work on increased adherence, avoid flare triggers, and be evaluated at upcoming provider appointment to assess progress    Problem: Education: Goal: Utilization of techniques to improve thought processes will improve Outcome: Progressing Goal: Knowledge of the prescribed therapeutic regimen will improve Outcome: Progressing   Problem: Coping: Goal: Coping ability will improve Outcome: Progressing Goal: Will verbalize feelings Outcome: Progressing   Problem: Health Behavior/Discharge Planning: Goal: Ability to make decisions will improve Outcome: Progressing Goal: Compliance with therapeutic regimen will improve Outcome: Progressing   Problem: Safety: Goal: Ability to disclose and discuss suicidal ideas will improve Outcome: Progressing Goal: Ability to identify and utilize support systems that promote safety will improve Outcome: Progressing   Problem: Education: Goal: Ability to make informed decisions regarding treatment will improve Outcome: Progressing   Problem: Coping: Goal: Coping ability will improve Outcome: Progressing   Problem: Health Behavior/Discharge Planning: Goal: Identification of resources available to assist in meeting health care needs will  improve Outcome: Progressing   Problem: Education: Goal: Knowledge of [facility] Education information/materials will improve Outcome: Progressing Goal: Emotional status will improve Outcome: Progressing Goal: Mental status will improve Outcome: Progressing Goal: Verbalization of understanding the information provided will improve Outcome: Progressing   Problem: Activity: Goal: Interest or engagement in activities will improve Outcome: Progressing Goal: Sleeping patterns will improve Outcome: Progressing   Problem: Coping: Goal: Ability to verbalize frustrations and anger appropriately will improve Outcome: Progressing Goal: Ability to demonstrate self-control will improve Outcome: Progressing   Problem: Health Behavior/Discharge Planning: Goal: Identification of resources available to assist in meeting health care needs will improve Outcome: Progressing Goal: Compliance with treatment plan for underlying cause of condition will improve Outcome: Progressing   Problem: Physical Regulation: Goal: Ability to maintain clinical measurements within normal limits will improve Outcome: Progressing   Problem: Safety: Goal: Periods of time without injury will increase Outcome: Progressing

## 2023-11-11 DIAGNOSIS — F332 Major depressive disorder, recurrent severe without psychotic features: Secondary | ICD-10-CM | POA: Diagnosis not present

## 2023-11-11 MED ORDER — DULOXETINE HCL 60 MG PO CPEP
90.0000 mg | ORAL_CAPSULE | Freq: Every day | ORAL | Status: DC
  Administered 2023-11-12 – 2023-11-15 (×4): 90 mg via ORAL
  Filled 2023-11-11 (×4): qty 1

## 2023-11-11 NOTE — Group Note (Signed)
 Recreation Therapy Group Note   Group Topic:General Recreation  Group Date: 11/11/2023 Start Time: 1100 End Time: 1135 Facilitators: Celestia Jeoffrey BRAVO, LRT, CTRS Location: Courtyard  Group Description: Outdoor Recreation. Patients had the option to play corn hole, ring toss, bowling or listening to music while outside in the courtyard getting fresh air and sunlight. Patients helped water and prune the raised garden beds. LRT and patients discussed things that they enjoy doing in their free time outside of the hospital. LRT encouraged patients to drink water after being active and getting their heart rate up.   Goal Area(s) Addressed: Patient will identify leisure interests.  Patient will practice healthy decision making. Patient will engage in recreation activity.   Affect/Mood: N/A   Participation Level: Did not attend    Clinical Observations/Individualized Feedback: Patient did not attend group.   Plan: Continue to engage patient in RT group sessions 2-3x/week.   Jeoffrey BRAVO Celestia, LRT, CTRS 11/11/2023 1:54 PM
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
## 2023-11-11 NOTE — Progress Notes (Signed)
 Patient is pleasant, cooperative and soft spoken.  Endorses depression.  Denies SI/HI and AVH.  Denies pain.   Compliant with scheduled medications. 15 min checks in place for safety. Patient is only present in the milieu for meals and groups. Minimal interaction with peers and staff.

## 2023-11-11 NOTE — [facility] Counselor (Signed)
 Adult Comprehensive Assessment  Patient ID: Charles [name], male   DOB: [birth year], 63 y.o.   MRN: [id number]  Information Source: Information source: Patient  Current Stressors:  Patient states their primary concerns and needs for treatment are:: Suicidal and drinking Patient states their goals for this hospitilization and ongoing recovery are:: getting sobered up Educational / Learning stressors: None reported Employment / Job issues: None reported , pt reports he has been on disability for 2 years, amputee Family Relationships: My family aren't even coming around Pt reports his son lives in [place] and his daughter lives in [place] [employer] / Lack of resources (include bankruptcy): Pt reports he does have financial stressors, reports he went from 2 incomes to 1 income since wife passed away Housing / Lack of housing: None reported, pt reports he owns a home Physical health (include injuries & life threatening diseases): Pt reports his back hurts all the time, reports he broke his back in 2014, he has phantom pain in left foot, neuropathy, left shoulder pain, broken shoulder + collar bone+ rotator cuff ,  and he reports he has a pain pump Social relationships: I dont have any, I'm stuck at home all the time, I don't have a licence or car so I sat at my house two or three months without company coming over Substance abuse: Pt states Liquor, not interested in rehab Bereavement / Loss: Wife passed away 2022-11-08 , interested in grief counseling if there are phone options available  Living/Environment/Situation:  Living Arrangements: Alone Living conditions (as described by patient or guardian): it's a nice little house Who else lives in the home?: Pt reports he has a dog How long has patient lived in current situation?: 25 years What is atmosphere in current home: Comfortable  Family History:  Marital status: Widowed Widowed, when?: 11/08/22. Are you sexually active?:  No What is your sexual orientation?: Pt does not report Has your sexual activity been affected by drugs, alcohol , medication, or emotional stress?: no Does patient have children?: Yes How many children?: 2 How is patient's relationship with their children?: I aint seen my son in 5 years probably, I haven't seen my daughter in 6 months  Childhood History:  By whom was/is the patient raised?: Father Additional childhood history information: Pt reports that his father was a single parent, and he stayed at his grandma's most of the time Description of patient's relationship with caregiver when they were a child: Pt states his relationship with his dad  was good reports relationship with his grandma was great Patient's description of current relationship with people who raised him/her: Pt reports they are both deceased How were you disciplined when you got in trouble as a child/adolescent?: I was a pretty good kid, I didn't get in trouble Does patient have siblings?: Yes Number of Siblings: 4 Description of patient's current relationship with siblings: 2 brothers, 2 sisters.  My two sisters is good, my two brothers, I don't talk to very often. Pt reports one sister in [place] and one  sister in [place] Did patient suffer any verbal/emotional/physical/sexual abuse as a child?: No Did patient suffer from severe childhood neglect?: No Has patient ever been sexually abused/assaulted/raped as an adolescent or adult?: No Was the patient ever a victim of a crime or a disaster?: No Spoken with a professional about abuse?: No Does patient feel these issues are resolved?: Yes Witnessed domestic violence?: No Has patient been affected by domestic violence as an adult?: No  Education:  Highest grade of school patient has completed: couple years of college, business management Currently a student?: No Learning disability?: No  Employment/Work Situation:   Employment Situation: On  disability Why is Patient on Disability: Pt reports for physical reasons How Long has Patient Been on Disability: Pt reports 2 years Patient's Job has Been Impacted by Current Illness: No What is the Longest Time Patient has Held a Job?: 22 years Where was the Patient Employed at that Time?: [employer] Has Patient ever Been in the [employer]?: No  Financial Resources:   Surveyor, quantity resources: [employer], [employer], Medicaid Does patient have a representative payee or guardian?: No  Alcohol /Substance Abuse:   What has been your use of drugs/alcohol  within the last 12 months?: everyday If attempted suicide, did drugs/alcohol  play a role in this?: No Alcohol /Substance Abuse Treatment Hx: Denies past history Has alcohol /substance abuse ever caused legal problems?: No  Social Support System:   Conservation officer, nature Support System: Fair Development worker, community Support System: my sister Type of faith/religion: Baptist How does patient's faith help to cope with current illness?: No,  I quit going to church about two years ago  Leisure/Recreation:   Do You Have Hobbies?: No  Strengths/Needs:   What is the patient's perception of their strengths?: Right now, nothing Patient states they can use these personal strengths during their treatment to contribute to their recovery: Pt does not report Patient states these barriers may affect/interfere with their treatment: No Patient states these barriers may affect their return to the community: No Other important information patient would like considered in planning for their treatment: No  Discharge Plan:   Currently receiving community mental health services: No Patient states concerns and preferences for aftercare planning are: Pt reports he is interested if he got transportation to the appointments Patient states they will know when they are safe and ready for discharge when: I jjust mentally know, I mean I'm ready to go now I look  back and see how crazy I got, just ignorant, I'm ready to move forward Does patient have access to transportation?: No Does patient have financial barriers related to discharge medications?: No Patient description of barriers related to discharge medications: None reported Plan for no access to transportation at discharge: CSW will assist with resources for transporation Will patient be returning to same living situation after discharge?: Yes  Summary/Recommendations:   Summary and Recommendations (to be completed by the evaluator): Patient is a 63 year-old male from [place] [place] [facility] ED voluntarily. Per triage note Pt presented to ED POV brought in by a friend stating I hate myself. States has been having these feelings x 2 weeks. Friend states pt drinks 1 gallon of liquor a day. Friend states he has been expressing SI x 2 days stating I'm done, I cannot do this much long. Upon assessment today pt reports increased depression since his wife died 1 year ago. Pt reports she died in October 23, 2023 of last year. Pt reports he sits at home a lone most of the time, reporting he does not have access to transportation. Pt reports that he drinks alcohol  frequently but does not want to go to in-patient rehab or outpatent resources for substance use. Pt reports he has chronic pain throughout his body and reports that contributes to his depression. Pt reports his supports are his sisters and his dog. Pt reports he would be interested in therapy or psychiatry if he had transportation there. MDD (major depressive disorder), recurrent severe, without  psychosis (HCC) (F33.2). Recommendations include: crisis stabilization, therapeutic milieu, encourage group attendance and participation, medication management for mood stabilization and development of comprehensive mental wellness/sobriety plan.  Charles [name]. 11/11/2023
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
## 2023-11-11 NOTE — Group Note (Signed)
 Date:  11/11/2023 Time:  10:07 PM  Group Topic/Focus:  Self Care:   The focus of this group is to help patients understand the importance of self-care in order to improve or restore emotional, physical, spiritual, interpersonal, and financial health.    Participation Level:  Did Not Attend  Participation Quality:  Did Not Attend  Affect:  Did Not Attend  Cognitive:  Did Not Attend  Insight: None  Engagement in Group:  None  Modes of Intervention:  Did Not Attend  Additional Comments:    Laymon ONEIDA Finder 11/11/2023, 10:07 PM

## 2023-11-11 NOTE — Group Note (Signed)
 Recreation Therapy Group Note   Group Topic:Stress Management  Group Date: 11/11/2023 Start Time: 1500 End Time: 1600 Facilitators: Celestia Jeoffrey BRAVO, LRT, CTRS Location: Dayroom  Group Description: Stress Charades. Patients and LRT discuss the emotion "stress" and things that are associated with it. LRT encourages pts to make their own list of things that stress them out on a piece of paper provided. LRT encourages pts to identify their top 5 stressors from original list and has pts write them on smaller cut slips of paper. LRT folds and places slips into a container. Pt pulls a random slip out of the container and acts it out. LRT and pts try to guess what the peer is acting out. After all slips of paper are acted out, LRT and pts will discuss different coping skills that help them when they're feeling stressed.   Goal Area(s) Addressed: Patient will identify things that make them feel stressed.  Patient will visualize stressors by acting them out.  Patient will recognize that others experience stress. Patient will become more self-aware of stressors. Patient will discuss and learn alternative coping strategies for handling stress.   Affect/Mood: N/A   Participation Level: Did not attend    Clinical Observations/Individualized Feedback: Patient did not attend group.   Plan: Continue to engage patient in RT group sessions 2-3x/week.   Jeoffrey BRAVO Celestia, LRT, CTRS 11/11/2023 4:14 PM

## 2023-11-11 NOTE — [facility] Suicide Risk Assessment (Signed)
 [facility] INPATIENT:  Family/Significant Other Suicide Prevention Education  Suicide Prevention Education:  Patient Refusal for Family/Significant Other Suicide Prevention Education: The patient Charles [name] has refused to provide written consent for family/significant other to be provided Family/Significant Other Suicide Prevention Education during admission and/or prior to discharge.  Physician notified.  [name] 11/11/2023, 10:32 AM

## 2023-11-11 NOTE — Group Note (Signed)
 Date:  11/11/2023 Time:  11:12 AM  Group Topic/Focus:  Goals Group/Karaoke:   The focus of this group is to help patients establish daily goals to achieve during treatment and discuss how the patient can incorporate goal setting into their daily lives to aide in recovery. Also singing along to some of their favorite songs while interacting with peers.    Participation Level:  Minimal  Participation Quality:  Sharing  Affect:  Flat  Cognitive:  Appropriate  Insight: Lacking and Limited  Engagement in Group:  Limited and Poor  Modes of Intervention:  Activity and Discussion  Additional Comments:    Beatris ONEIDA Hasten 11/11/2023, 11:12 AM

## 2023-11-11 NOTE — Plan of Care (Signed)
  Problem: Coping: Goal: Coping ability will improve Outcome: Progressing   Problem: Medication: Goal: Compliance with prescribed medication regimen will improve Outcome: Progressing   Problem: Education: Goal: Knowledge of the prescribed therapeutic regimen will improve Outcome: Progressing

## 2023-11-12 DIAGNOSIS — F332 Major depressive disorder, recurrent severe without psychotic features: Secondary | ICD-10-CM | POA: Diagnosis not present

## 2023-11-12 NOTE — Progress Notes (Signed)
 Date: 11/12/2023 3:39 PM Patient: Charles [name], MRN: [id number]  Subjective: Patient seen during rounds. Today on interview, patient reports feeling better and that he has in-home services now. He states he was feeling depressed and overwhelmed but has been improving. He denies current suicidal or homicidal thoughts, plan, or intent. He continues to deny alcohol  withdrawal symptoms or hallucinations. He has been participating in groups and milieu activities.  Objective:  Appearance: Appropriate for environment, casual.  Eye Contact: Fair.  Speech: Clear and coherent, normal volume.  Mood: Reported as improving from depressed/dysphoric.  Affect: Depressed, but less flat than prior.  Thought Process: Coherent, logical, associations intact.  Thought Content: Denies suicidal/homicidal ideation, no ideas of reference or hallucinations.  Orientation: Full (Time, Place, Person).  Memory: Immediate, recent, and remote memory appear fair.  Judgment: Remains impaired.  Insight: Remains shallow regarding the seriousness of his depression and alcohol  use, but shows some progress in engaging with treatment.  Concentration/Attention Span/Recall/Fund of Knowledge/Language: Fair.  Psychomotor Activity: No data recorded.  CIWA-Ar Total: 2 (consistent with no significant withdrawal).  Blood pressure 136/88, pulse 85, temperature (!) 97.1 F (36.2 C), resp. rate 18, height 5' 8 (1.727 m), weight 113.4 kg, SpO2 100%.   Assessment: Charles [name] is a 63 year old male admitted with severe recurrent major depressive disorder and alcohol  use, exacerbated by the recent loss of his wife and significant physical limitations. He presented with suicidal ideation. Today, he reports subjective improvement in mood, reduced feelings of being overwhelmed, and denies current suicidal/homicidal ideation. He continues to demonstrate limited insight and impaired judgment regarding his mental health and alcohol   use, but his engagement in treatment and milieu activities is a positive development. His CIWA score remains low, indicating no active alcohol  withdrawal symptoms. He is tolerating his restarted psychotropic medications (Wellbutrin  XL and duloxetine ) well. Medical issues, including his multiple amputations and chronic pain, are being addressed with PT/OT consultation.  Plan:  Safety & Monitoring:  Continue voluntary inpatient psychiatric admission for safety, stabilization, and treatment.  Daily contact to assess symptoms and progress.  Case to be discussed in multidisciplinary team meeting.  Observation Level: Q15 minute checks.  Vital Signs: Q12 hours.  Precautions: Suicide, elopement, and assault remain in place.  Psychiatric Diagnoses & Treatment:  Continue CIWA protocol with Librium taper as indicated.  Continue bupropion  (Wellbutrin  XL) 300 mg daily and duloxetine  60 mg daily. Plan to titrate up as clinically appropriate.  Continue to encourage participation in unit milieu and scheduled group therapies.  Medical Issues:  Continue PT/OT evaluation for home health needs.  Continue current medications as ordered.  Discharge Planning:  Continue social work and case management involvement for discharge planning, identification of follow-up needs, and securing in-home services.  Estimated length of stay remains 3-4 days, contingent on sustained improvement in mood, insight, and safety.

## 2023-11-12 NOTE — Plan of Care (Signed)
  Problem: Education: Goal: Ability to make informed decisions regarding treatment will improve Outcome: Progressing   Problem: Coping: Goal: Coping ability will improve Outcome: Progressing   Problem: Medication: Goal: Compliance with prescribed medication regimen will improve Outcome: Progressing   

## 2023-11-12 NOTE — Group Note (Signed)
 Physical/Occupational Therapy Group Note  Group Topic: Estate manager/land agent, Adaptive Equipment for ADLs  Group Date: 11/12/2023 Start Time: 1300 End Time: 1330 Facilitators: Lavonda Therisa CROME, OT   Group Description: Group educated on safety considerations/modifications with bathing, dressing and ADL routines to maximize independence and minimize fall risk with tasks.  Reviewed and demonstrated use of shower chair and tub transfer bench as appropriate.  Reviewed and demonstrated use of adaptive equipment (sock aide, reacher, long-handled sponge) available for ADL tasks.  Reviewed and demonstrated role of activity pacing and energy conservation with functional activities.  Provided handout with visual reference of available equipment.  Therapeutic Goal(s):  Verbalize and demonstrate safe technique for tub/shower transfers with DME/adaptive equipment as needed. Verbalize and demonstrate appropriate use of adaptive equipment with bathing, dressing and ADL routine. Verbalize and demonstrate appropriate use of activity pacing and energy conservation with bathing, dressing and ADL routine.  Individual Participation: Pt provided examples from personal experiences with AE/DME use   Participation Level: Active   Participation Quality: Independent   Behavior: Appropriate   Speech/Thought Process: Coherent   Affect/Mood: Appropriate   Insight: Good   Judgement: Good   Modes of Intervention: Activity, Education, Problem-solving, and Role-play  Patient Response to Interventions:  Engaged   Plan: Continue to engage patient in PT/OT groups 1 - 2x/week.  11/12/2023  Therisa CROME Lavonda, OT Therisa Lavonda, OTD OTR/L  11/12/23, 2:33 PM

## 2023-11-12 NOTE — Group Note (Signed)
 Date:  11/12/2023 Time:  8:32 PM  Group Topic/Focus:  Making Healthy Choices:   The focus of this group is to help patients identify negative/unhealthy choices they were using prior to admission and identify positive/healthier coping strategies to replace them upon discharge.    Participation Level:  Active  Participation Quality:  Appropriate  Affect:  Appropriate  Cognitive:  Appropriate  Insight: Good  Engagement in Group:  Engaged  Modes of Intervention:  Discussion  Additional Comments:    Charles [name] 11/12/2023, 8:32 PM

## 2023-11-12 NOTE — Group Note (Signed)
 Date:  11/12/2023 Time:  10:48 AM  Group Topic/Focus:  Fresh air Therapy outdoors with music and conversation.    Participation Level:  Did Not Attend    Norleen SHAUNNA Bias 11/12/2023, 10:48 AM

## 2023-11-12 NOTE — Progress Notes (Signed)
 Patient is pleasant and cooperative. Sad affect.  Endorses anxiety and depression.  Denies SI/HI and AVH.  Reports chronic back pain, but declined PRN pain medication.   Compliant with scheduled medications.  PRN medication given for constipation.  Patient isolates to room with the exception of meals and groups.  Minimal, but appropriate interaction with peers and staff.

## 2023-11-12 NOTE — Plan of Care (Signed)
   Problem: Education: Goal: Ability to make informed decisions regarding treatment will improve Outcome: Progressing   Problem: Coping: Goal: Coping ability will improve Outcome: Progressing   Problem: Health Behavior/Discharge Planning: Goal: Identification of resources available to assist in meeting health care needs will improve Outcome: Progressing   Problem: Medication: Goal: Compliance with prescribed medication regimen will improve Outcome: Progressing

## 2023-11-13 DIAGNOSIS — F332 Major depressive disorder, recurrent severe without psychotic features: Secondary | ICD-10-CM | POA: Diagnosis not present

## 2023-11-13 NOTE — Progress Notes (Signed)
 SUBJECTIVE: Patient seen during rounds. Charles [name] reports feeling better and that he now has in-home services in place. He states he was feeling depressed and overwhelmed but has been improving. He denies current suicidal or homicidal thoughts, plan, or intent. He continues to deny alcohol  withdrawal symptoms or hallucinations. He has been participating in groups and milieu activities.  OBJECTIVE: Blood pressure (!) 138/95, pulse 72, temperature (!) 97 F (36.1 C), resp. rate 18, height 5' 8 (1.727 m), weight 113.4 kg, SpO2 99%.  Appearance: Appropriate for environment, casual.  Eye Contact: Fair.  Speech: Clear and coherent, normal volume.  Mood: Reported as improving from depressed/dysphoric.  Affect: Depressed, but less flat than prior.  Thought Process: Coherent, logical, associations intact.  Thought Content: Denies suicidal/homicidal ideation, no ideas of reference or hallucinations.  Orientation: Full (Time, Place, Person).  Memory: Immediate, recent, and remote memory appear fair.  Judgment: Remains impaired.  Insight: Remains shallow regarding the seriousness of his depression and alcohol  use, but shows some progress in engaging with treatment.  Concentration/Attention Span/Recall/Fund of Knowledge/Language: Fair.  Psychomotor Activity: No data recorded.  CIWA-Ar Total: 2 (consistent with no significant withdrawal).  Vital Signs: BP 136/88, P 85, T 97.57F (36.2C), RR 18, SpO2 100%. Height 5' 8 (1.727 m), Weight 113.4 kg.  ASSESSMENT: Charles [name] is a 63 year old male admitted with severe recurrent major depressive disorder and alcohol  use, exacerbated by the recent loss of his wife and significant physical limitations. He initially presented with suicidal ideation. Today, he reports subjective improvement in mood, reduced feelings of being overwhelmed, and denies current suicidal/homicidal ideation. He continues to demonstrate limited insight and impaired judgment  regarding his mental health and alcohol  use, but his engagement in treatment and milieu activities is a positive development. His CIWA score remains low, indicating no active alcohol  withdrawal symptoms. He is tolerating his restarted psychotropic medications (Wellbutrin  XL and duloxetine ) well. Medical issues, including his multiple amputations and chronic pain, are being addressed with PT/OT consultation.  PLAN:  Safety & Monitoring:  Continue voluntary inpatient psychiatric admission for safety, stabilization, and treatment.  Daily contact to assess symptoms and progress.  Case to be discussed in multidisciplinary team meeting.  Observation Level: Q15 minute checks.  Vital Signs: Q12 hours.  Precautions: Suicide, elopement, and assault remain in place.  Psychiatric Diagnoses & Treatment:  Continue CIWA protocol with Librium taper as indicated.  Continue bupropion  (Wellbutrin  XL) 300 mg daily and duloxetine  60 mg daily. Plan to titrate up as clinically appropriate.  Continue to encourage participation in unit milieu and scheduled group therapies.  Medical Issues:  Continue PT/OT evaluation for home health needs.  Continue current medications as ordered.  Discharge Planning:  Continue social work and case management involvement for discharge planning, identification of follow-up needs, and securing in-home services.  Estimated length of stay remains 3-4 days, contingent on sustained improvement in mood, insight, and safety. Current Facility-Administered Medications  Medication Dose Route Frequency Provider Last Rate Last Admin   acetaminophen  (TYLENOL ) tablet 650 mg  650 mg Oral Q6H PRN Jadapalle, Sree, MD   650 mg at 11/10/23 2148   albuterol  (VENTOLIN  HFA) 108 (90 Base) MCG/ACT inhaler 2 puff  2 puff Inhalation Q6H PRN Jadapalle, Sree, MD       alum & mag hydroxide-simeth (MAALOX/MYLANTA) 200-200-20 MG/5ML suspension 30 mL  30 mL Oral Q4H PRN Jadapalle, Sree, MD        amLODipine  (NORVASC ) tablet 5 mg  5 mg Oral Daily Jadapalle, Sree, MD  5 mg at 11/13/23 0953   atorvastatin  (LIPITOR ) tablet 40 mg  40 mg Oral Daily Jadapalle, Sree, MD   40 mg at 11/13/23 9046   buPROPion  (WELLBUTRIN  XL) 24 hr tablet 300 mg  300 mg Oral Daily Jadapalle, Sree, MD   300 mg at 11/13/23 0954   carvedilol  (COREG ) tablet 3.125 mg  3.125 mg Oral BID WC Jadapalle, Sree, MD   3.125 mg at 11/13/23 0954   cholecalciferol  (VITAMIN D3) 25 MCG (1000 UNIT) tablet 2,000 Units  2,000 Units Oral Daily Jadapalle, Sree, MD   2,000 Units at 11/13/23 9046   DULoxetine  (CYMBALTA ) DR capsule 90 mg  90 mg Oral Daily Jadapalle, Sree, MD   90 mg at 11/13/23 0953   furosemide  (LASIX ) tablet 20 mg  20 mg Oral Daily Jadapalle, Sree, MD   20 mg at 11/13/23 9046   hydrOXYzine  (ATARAX ) tablet 25 mg  25 mg Oral Q6H PRN Jadapalle, Sree, MD   25 mg at 11/12/23 2141   levothyroxine  (SYNTHROID ) tablet 75 mcg  75 mcg Oral Q0600 Jadapalle, Sree, MD   75 mcg at 11/13/23 0644   magnesium  hydroxide (MILK OF MAGNESIA) suspension 30 mL  30 mL Oral Daily PRN Donnelly Mellow, MD   30 mL at 11/12/23 1327   OLANZapine  (ZYPREXA ) injection 5 mg  5 mg Intramuscular TID PRN Jadapalle, Sree, MD       OLANZapine  zydis (ZYPREXA ) disintegrating tablet 5 mg  5 mg Oral TID PRN Jadapalle, Sree, MD       thiamine  (VITAMIN B1) tablet 100 mg  100 mg Oral Daily Jadapalle, Sree, MD   100 mg at 11/13/23 9043   Or   thiamine  (VITAMIN B1) injection 100 mg  100 mg Intravenous Daily Jadapalle, Sree, MD       traZODone  (DESYREL ) tablet 50 mg  50 mg Oral QHS PRN Jadapalle, Sree, MD   50 mg at 11/12/23 2141

## 2023-11-13 NOTE — Group Note (Signed)
 Date:  11/13/2023 Time:  9:30 PM  Group Topic/Focus:  Wrap-Up Group:   The focus of this group is to help patients review their daily goal of treatment and discuss progress on daily workbooks.    Participation Level:  Active  Participation Quality:  Appropriate  Affect:  Appropriate  Cognitive:  Alert  Insight: Appropriate  Engagement in Group:  Engaged  Modes of Intervention:  Discussion  Additional Comments:    Charles [name] CHRISTELLA Bunker 11/13/2023, 9:30 PM

## 2023-11-13 NOTE — Plan of Care (Signed)
  Problem: Education: Goal: Ability to make informed decisions regarding treatment will improve Outcome: Progressing   Problem: Coping: Goal: Coping ability will improve Outcome: Progressing   Problem: Health Behavior/Discharge Planning: Goal: Identification of resources available to assist in meeting health care needs will improve Outcome: Progressing   Problem: Medication: Goal: Compliance with prescribed medication regimen will improve Outcome: Progressing   Problem: Self-Concept: Goal: Ability to disclose and discuss suicidal ideas will improve Outcome: Progressing   Problem: Education: Goal: Ability to make informed decisions regarding treatment will improve Outcome: Progressing   Problem: Coping: Goal: Coping ability will improve Outcome: Progressing

## 2023-11-13 NOTE — Group Note (Unsigned)
 Date:  11/13/2023 Time:  2:51 PM  Group Topic/Focus:  Movement Therapy     Participation Level:  {BHH PARTICIPATION OZCZO:77735}  Participation Quality:  {BHH PARTICIPATION QUALITY:22265}  Affect:  {BHH AFFECT:22266}  Cognitive:  {BHH COGNITIVE:22267}  Insight: {BHH Insight2:20797}  Engagement in Group:  {BHH ENGAGEMENT IN HMNLE:77731}  Modes of Intervention:  {BHH MODES OF INTERVENTION:22269}  Additional Comments:  ***  Charles [name] Bias 11/13/2023, 2:51 PM

## 2023-11-13 NOTE — Progress Notes (Signed)
   11/12/23 2200  Psych Admission Type (Psych Patients Only)  Admission Status Voluntary  Psychosocial Assessment  Patient Complaints Anxiety  Eye Contact Fair  Facial Expression Sad  Affect Sad  Speech Soft  Interaction Assertive  Motor Activity Other (Comment) (wheelchair)  Appearance/Hygiene In scrubs  Behavior Characteristics Cooperative  Mood Sad  Thought Process  Coherency WDL  Content WDL  Delusions None reported or observed  Perception WDL  Hallucination None reported or observed  Judgment Impaired  Confusion Mild  Danger to Self  Current suicidal ideation? Denies

## 2023-11-13 NOTE — Plan of Care (Signed)
  Problem: Coping: Goal: Coping ability will improve Outcome: Progressing   Problem: Self-Concept: Goal: Will verbalize positive feelings about self Outcome: Progressing Note: Patient is on track. Patient will maintain adherence    Problem: Coping: Goal: Coping ability will improve Outcome: Progressing   Problem: Coping: Goal: Coping ability will improve Outcome: Progressing

## 2023-11-13 NOTE — Group Note (Signed)
 Date:  11/13/2023 Time:  2:48 PM  Group Topic/Focus:  Movement Therapy    Participation Level:  Did Not Attend    Norleen SHAUNNA Bias 11/13/2023, 2:48 PM

## 2023-11-13 NOTE — Progress Notes (Signed)
   11/13/23 1100  Psych Admission Type (Psych Patients Only)  Admission Status Voluntary  Psychosocial Assessment  Patient Complaints Depression  Eye Contact Fair  Facial Expression Sad  Affect Sad  Speech Soft  Interaction Isolative  Motor Activity Other (Comment) (wheelchair)  Appearance/Hygiene In scrubs  Behavior Characteristics Cooperative  Mood Depressed;Pleasant  Thought Process  Coherency WDL  Content WDL  Delusions None reported or observed  Perception WDL  Hallucination None reported or observed  Judgment Impaired  Confusion Mild  Danger to Self  Current suicidal ideation? Denies  Agreement Not to Harm Self Yes  Description of Agreement Verbal  Danger to Others  Danger to Others None reported or observed

## 2023-11-14 DIAGNOSIS — F332 Major depressive disorder, recurrent severe without psychotic features: Secondary | ICD-10-CM | POA: Diagnosis not present

## 2023-11-14 NOTE — Plan of Care (Signed)
  Problem: Education: Goal: Ability to make informed decisions regarding treatment will improve Outcome: Progressing   Problem: Coping: Goal: Coping ability will improve Outcome: Progressing   Problem: Health Behavior/Discharge Planning: Goal: Identification of resources available to assist in meeting health care needs will improve Outcome: Progressing   Problem: Medication: Goal: Compliance with prescribed medication regimen will improve Outcome: Progressing   Problem: Self-Concept: Goal: Ability to disclose and discuss suicidal ideas will improve Outcome: Progressing Goal: Will verbalize positive feelings about self Outcome: Progressing Note: Patient is on track. Patient will maintain adherence, avoid flare triggers, and be evaluated at upcoming provider appointment to assess progress    Problem: Education: Goal: Utilization of techniques to improve thought processes will improve Outcome: Progressing Goal: Knowledge of the prescribed therapeutic regimen will improve Outcome: Progressing   Problem: Coping: Goal: Coping ability will improve Outcome: Progressing Goal: Will verbalize feelings Outcome: Progressing   Problem: Health Behavior/Discharge Planning: Goal: Ability to make decisions will improve Outcome: Progressing Goal: Compliance with therapeutic regimen will improve Outcome: Progressing   Problem: Safety: Goal: Ability to disclose and discuss suicidal ideas will improve Outcome: Progressing Goal: Ability to identify and utilize support systems that promote safety will improve Outcome: Progressing   Problem: Education: Goal: Ability to make informed decisions regarding treatment will improve Outcome: Progressing   Problem: Coping: Goal: Coping ability will improve Outcome: Progressing   Problem: Health Behavior/Discharge Planning: Goal: Identification of resources available to assist in meeting health care needs will improve Outcome: Progressing    Problem: Education: Goal: Knowledge of [facility] Education information/materials will improve Outcome: Progressing Goal: Emotional status will improve Outcome: Progressing Goal: Mental status will improve Outcome: Progressing Goal: Verbalization of understanding the information provided will improve Outcome: Progressing   Problem: Activity: Goal: Interest or engagement in activities will improve Outcome: Progressing Goal: Sleeping patterns will improve Outcome: Progressing   Problem: Coping: Goal: Ability to verbalize frustrations and anger appropriately will improve Outcome: Progressing Goal: Ability to demonstrate self-control will improve Outcome: Progressing   Problem: Health Behavior/Discharge Planning: Goal: Identification of resources available to assist in meeting health care needs will improve Outcome: Progressing Goal: Compliance with treatment plan for underlying cause of condition will improve Outcome: Progressing   Problem: Physical Regulation: Goal: Ability to maintain clinical measurements within normal limits will improve Outcome: Progressing   Problem: Safety: Goal: Periods of time without injury will increase Outcome: Progressing

## 2023-11-14 NOTE — Group Note (Signed)
 Date:  11/14/2023 Time:  9:36 PM  Group Topic/Focus:  Wrap-Up Group:   The focus of this group is to help patients review their daily goal of treatment and discuss progress on daily workbooks.    Participation Level:  Did Not Attend  Participation Quality:     Affect:     Cognitive:     Insight: None  Engagement in Group:  None  Modes of Intervention:     Additional Comments:    Tommas CHRISTELLA Bunker 11/14/2023, 9:36 PM

## 2023-11-14 NOTE — Progress Notes (Signed)
 SUBJECTIVE: Patient seen during rounds.  He is very focused on going home and wants to get discharged he feels much better he has home health services and feels that he is ready to go no concerns reported at this time. OBJECTIVE: Blood pressure (!) 141/99, pulse 83, temperature 98.2 F (36.8 C), resp. rate 18, height 5' 8 (1.727 m), weight 113.4 kg, SpO2 96%.  Appearance: Appropriate for environment, casual.  Eye Contact: Fair.  Speech: Clear and coherent, normal volume.  Mood: Reported as improving from depressed/dysphoric.  Affect: Depressed, but less flat than prior.  Thought Process: Coherent, logical, associations intact.  Thought Content: Denies suicidal/homicidal ideation, no ideas of reference or hallucinations.  Orientation: Full (Time, Place, Person).  Memory: Immediate, recent, and remote memory appear fair.  Judgment: Remains impaired.  Insight: Remains shallow regarding the seriousness of his depression and alcohol  use, but shows some progress in engaging with treatment.  Concentration/Attention Span/Recall/Fund of Knowledge/Language: Fair.  Psychomotor Activity: No data recorded.  CIWA-Ar Total: 2 (consistent with no significant withdrawal).  Vital Signs: BP 136/88, P 85, T 97.18F (36.2C), RR 18, SpO2 100%. Height 5' 8 (1.727 m), Weight 113.4 kg.  ASSESSMENT: Charles [name] is a 63 year old male admitted with severe recurrent major depressive disorder and alcohol  use, exacerbated by the recent loss of his wife and significant physical limitations. He initially presented with suicidal ideation. Today, he reports subjective improvement in mood, reduced feelings of being overwhelmed, and denies current suicidal/homicidal ideation. He continues to demonstrate limited insight and impaired judgment regarding his mental health and alcohol  use, but his engagement in treatment and milieu activities is a positive development. His CIWA score remains low, indicating no active  alcohol  withdrawal symptoms. He is tolerating his restarted psychotropic medications (Wellbutrin  XL and duloxetine ) well. Medical issues, including his multiple amputations and chronic pain, are being addressed with PT/OT consultation.  PLAN:  Safety & Monitoring:  Continue voluntary inpatient psychiatric admission for safety, stabilization, and treatment.  Daily contact to assess symptoms and progress.  Case to be discussed in multidisciplinary team meeting.  Observation Level: Q15 minute checks.  Vital Signs: Q12 hours.  Precautions: Suicide, elopement, and assault remain in place.  Psychiatric Diagnoses & Treatment:  Continue CIWA protocol with Librium taper as indicated.  Continue bupropion  (Wellbutrin  XL) 300 mg daily and duloxetine  60 mg daily. Plan to titrate up as clinically appropriate.  Continue to encourage participation in unit milieu and scheduled group therapies.  Medical Issues:  Continue PT/OT evaluation for home health needs.  Continue current medications as ordered.  Discharge Planning:  Continue social work and case management involvement for discharge planning, identification of follow-up needs, and securing in-home services.  Estimated length of stay remains 3-4 days, contingent on sustained improvement in mood, insight, and safety. Current Facility-Administered Medications  Medication Dose Route Frequency Provider Last Rate Last Admin   acetaminophen  (TYLENOL ) tablet 650 mg  650 mg Oral Q6H PRN Jadapalle, Sree, MD   650 mg at 11/10/23 2148   albuterol  (VENTOLIN  HFA) 108 (90 Base) MCG/ACT inhaler 2 puff  2 puff Inhalation Q6H PRN Jadapalle, Sree, MD       alum & mag hydroxide-simeth (MAALOX/MYLANTA) 200-200-20 MG/5ML suspension 30 mL  30 mL Oral Q4H PRN Jadapalle, Sree, MD       amLODipine  (NORVASC ) tablet 5 mg  5 mg Oral Daily Jadapalle, Sree, MD   5 mg at 11/14/23 9177   atorvastatin  (LIPITOR ) tablet 40 mg  40 mg Oral Daily Jadapalle, Sree, MD  40 mg  at 11/14/23 9177   buPROPion  (WELLBUTRIN  XL) 24 hr tablet 300 mg  300 mg Oral Daily Jadapalle, Sree, MD   300 mg at 11/14/23 0823   carvedilol  (COREG ) tablet 3.125 mg  3.125 mg Oral BID WC Jadapalle, Sree, MD   3.125 mg at 11/14/23 9177   cholecalciferol  (VITAMIN D3) 25 MCG (1000 UNIT) tablet 2,000 Units  2,000 Units Oral Daily Jadapalle, Sree, MD   2,000 Units at 11/14/23 9177   DULoxetine  (CYMBALTA ) DR capsule 90 mg  90 mg Oral Daily Jadapalle, Sree, MD   90 mg at 11/14/23 9177   furosemide  (LASIX ) tablet 20 mg  20 mg Oral Daily Jadapalle, Sree, MD   20 mg at 11/14/23 0823   hydrOXYzine  (ATARAX ) tablet 25 mg  25 mg Oral Q6H PRN Jadapalle, Sree, MD   25 mg at 11/13/23 2122   levothyroxine  (SYNTHROID ) tablet 75 mcg  75 mcg Oral Q0600 Jadapalle, Sree, MD   75 mcg at 11/14/23 0611   magnesium  hydroxide (MILK OF MAGNESIA) suspension 30 mL  30 mL Oral Daily PRN Donnelly Mellow, MD   30 mL at 11/12/23 1327   OLANZapine  (ZYPREXA ) injection 5 mg  5 mg Intramuscular TID PRN Jadapalle, Sree, MD       OLANZapine  zydis (ZYPREXA ) disintegrating tablet 5 mg  5 mg Oral TID PRN Jadapalle, Sree, MD       thiamine  (VITAMIN B1) tablet 100 mg  100 mg Oral Daily Jadapalle, Sree, MD   100 mg at 11/14/23 9177   Or   thiamine  (VITAMIN B1) injection 100 mg  100 mg Intravenous Daily Jadapalle, Sree, MD       traZODone  (DESYREL ) tablet 50 mg  50 mg Oral QHS PRN Jadapalle, Sree, MD   50 mg at 11/13/23 2122

## 2023-11-14 NOTE — Plan of Care (Signed)
  Problem: Education: Goal: Utilization of techniques to improve thought processes will improve Outcome: Progressing Goal: Knowledge of the prescribed therapeutic regimen will improve Outcome: Progressing   Problem: Coping: Goal: Coping ability will improve Outcome: Progressing Goal: Will verbalize feelings Outcome: Progressing   

## 2023-11-14 NOTE — Progress Notes (Signed)
   11/14/23 2134  Psych Admission Type (Psych Patients Only)  Admission Status Voluntary  Psychosocial Assessment  Patient Complaints None  Eye Contact Fair  Facial Expression Flat  Affect Appropriate to circumstance  Speech Logical/coherent  Interaction Assertive  Motor Activity Slow  Appearance/Hygiene Unremarkable  Behavior Characteristics Cooperative;Appropriate to situation  Mood Pleasant  Thought Process  Coherency WDL  Content WDL  Delusions None reported or observed  Perception WDL  Hallucination None reported or observed  Judgment Impaired  Confusion WDL  Danger to Self  Current suicidal ideation? Denies  Self-Injurious Behavior No self-injurious ideation or behavior indicators observed or expressed   Agreement Not to Harm Self Yes  Description of Agreement verbal  Danger to Others  Danger to Others None reported or observed

## 2023-11-14 NOTE — Group Note (Signed)
 LCSW Group Therapy Note  Group Date: 11/14/2023 Start Time: 1020 End Time: 1120   Type of Therapy and Topic:  Group Therapy - Healthy vs Unhealthy Coping Skills  Participation Level:  Active   Description of Group The focus of this group was to determine what unhealthy coping techniques typically are used by group members and what healthy coping techniques would be helpful in coping with various problems. Patients were guided in becoming aware of the differences between healthy and unhealthy coping techniques. Patients were asked to identify 2-3 healthy coping skills they would like to learn to use more effectively.  Therapeutic Goals Patients learned that coping is what human beings do all day long to deal with various situations in their lives Patients defined and discussed healthy vs unhealthy coping techniques Patients identified their preferred coping techniques and identified whether these were healthy or unhealthy Patients determined 2-3 healthy coping skills they would like to become more familiar with and use more often. Patients provided support and ideas to each other   Summary of Patient Progress:  During group, patient  expressed that he has no one and can not leave the house at this time feels lonely. Patient proved open to input from peers and feedback from CSW. Patient demonstrated great  insight into the subject matter, was respectful of peers, and participated throughout the entire session.   Therapeutic Modalities Cognitive Behavioral Therapy Motivational Interviewing  Donnice LELON Favor, LCSWA 11/14/2023  11:31 AM

## 2023-11-14 NOTE — Plan of Care (Signed)
  Problem: Education: Goal: Ability to make informed decisions regarding treatment will improve Outcome: Progressing   Problem: Coping: Goal: Coping ability will improve Outcome: Progressing   Problem: Medication: Goal: Compliance with prescribed medication regimen will improve Outcome: Progressing   

## 2023-11-14 NOTE — Progress Notes (Signed)
   11/13/23 2100  Psych Admission Type (Psych Patients Only)  Admission Status Voluntary  Psychosocial Assessment  Patient Complaints Depression  Eye Contact Fair  Facial Expression Sad  Affect Sad  Speech Soft  Interaction Isolative  Motor Activity Other (Comment) (Wheelchair)  Appearance/Hygiene In scrubs  Behavior Characteristics Cooperative  Mood Depressed  Thought Process  Coherency WDL  Content WDL  Delusions None reported or observed  Perception WDL  Hallucination None reported or observed  Judgment Impaired  Confusion Mild  Danger to Self  Current suicidal ideation? Denies

## 2023-11-14 NOTE — Group Note (Signed)
 Date:  11/14/2023 Time:  5:46 PM  Group Topic/Focus:  Healthy Communication:   The focus of this group is to discuss communication, barriers to communication, as well as healthy ways to communicate with others. Making Healthy Choices:   The focus of this group is to help patients identify negative/unhealthy choices they were using prior to admission and identify positive/healthier coping strategies to replace them upon discharge. This group focused on healthy eating. We discussed healthy recipes, sweet treats, snacks, drinks and tips to improve the patients overall daily lifestyle.     Participation Level:  Active  Participation Quality:  Appropriate  Affect:  Appropriate  Cognitive:  Appropriate  Insight: Appropriate  Engagement in Group:  Engaged  Modes of Intervention:  Activity, Discussion, and Education  Additional Comments:    Charles [name] L Charles [name] 11/14/2023, 5:46 PM

## 2023-11-15 ENCOUNTER — Other Ambulatory Visit: Payer: Self-pay

## 2023-11-15 DIAGNOSIS — F332 Major depressive disorder, recurrent severe without psychotic features: Secondary | ICD-10-CM | POA: Diagnosis not present

## 2023-11-15 MED ORDER — VITAMIN B-1 100 MG PO TABS
100.0000 mg | ORAL_TABLET | Freq: Every day | ORAL | 0 refills | Status: AC
  Filled 2023-11-15: qty 30, 30d supply, fill #0

## 2023-11-15 MED ORDER — ALBUTEROL SULFATE HFA 108 (90 BASE) MCG/ACT IN AERS
2.0000 | INHALATION_SPRAY | Freq: Four times a day (QID) | RESPIRATORY_TRACT | 5 refills | Status: AC | PRN
  Filled 2023-11-15: qty 6.7, 30d supply, fill #0
  Filled 2023-11-15: qty 18, 25d supply, fill #0

## 2023-11-15 MED ORDER — FUROSEMIDE 20 MG PO TABS
20.0000 mg | ORAL_TABLET | Freq: Every day | ORAL | 0 refills | Status: AC
  Filled 2023-11-15: qty 30, 30d supply, fill #0

## 2023-11-15 MED ORDER — LEVOTHYROXINE SODIUM 75 MCG PO TABS
75.0000 ug | ORAL_TABLET | Freq: Every day | ORAL | 0 refills | Status: AC
  Filled 2023-11-15: qty 30, 30d supply, fill #0

## 2023-11-15 MED ORDER — VITAMIN D3 25 MCG PO TABS
2000.0000 [IU] | ORAL_TABLET | Freq: Every day | ORAL | 0 refills | Status: AC
  Filled 2023-11-15: qty 60, 30d supply, fill #0

## 2023-11-15 MED ORDER — AMLODIPINE BESYLATE 5 MG PO TABS
5.0000 mg | ORAL_TABLET | Freq: Every day | ORAL | 0 refills | Status: AC
  Filled 2023-11-15: qty 30, 30d supply, fill #0

## 2023-11-15 MED ORDER — CARVEDILOL 3.125 MG PO TABS
3.1250 mg | ORAL_TABLET | Freq: Two times a day (BID) | ORAL | 0 refills | Status: AC
  Filled 2023-11-15: qty 60, 30d supply, fill #0

## 2023-11-15 MED ORDER — DULOXETINE HCL 30 MG PO CPEP
90.0000 mg | ORAL_CAPSULE | Freq: Every day | ORAL | 0 refills | Status: AC
  Filled 2023-11-15: qty 90, 30d supply, fill #0

## 2023-11-15 MED ORDER — BUPROPION HCL ER (XL) 300 MG PO TB24
300.0000 mg | ORAL_TABLET | Freq: Every day | ORAL | 0 refills | Status: AC
  Filled 2023-11-15: qty 30, 30d supply, fill #0

## 2023-11-15 MED ORDER — ATORVASTATIN CALCIUM 40 MG PO TABS
40.0000 mg | ORAL_TABLET | Freq: Every day | ORAL | 0 refills | Status: AC
  Filled 2023-11-15: qty 30, 30d supply, fill #0

## 2023-11-15 NOTE — Plan of Care (Deleted)
 New admission  Problem: Education: Goal: Ability to make informed decisions regarding treatment will improve Outcome: Not Progressing   Problem: Coping: Goal: Coping ability will improve Outcome: Not Progressing

## 2023-11-15 NOTE — Group Note (Signed)
 Date:  11/15/2023 Time:  9:48 AM  Group Topic/Focus:  Self Care:   The focus of this group is to help patients understand the importance of self-care in order to improve or restore emotional, physical, spiritual, interpersonal, and financial health.    Participation Level:  Did Not Attend  Harlene LITTIE Gavel 11/15/2023, 9:48 AM

## 2023-11-15 NOTE — Progress Notes (Signed)
 Patient is pleasant and cooperative.  Sad, flat affect.  Denies SI/HI and AVH.  Denies anxiety and depression. Pain rated 3/10 in back (chronic).    Compliant with scheduled medications. 15 min checks in place for safety.  Patient is present in the milieu.  Appropriate interaction with peers and staff.   Patient will discharge today.

## 2023-11-15 NOTE — [facility] Suicide Risk Assessment (Signed)
 [facility] Discharge Suicide Risk Assessment   Principal Problem: MDD (major depressive disorder), recurrent severe, without psychosis (HCC) Discharge Diagnoses: Principal Problem:   MDD (major depressive disorder), recurrent severe, without psychosis (HCC)   Total Time spent with patient: 30 minutes  Musculoskeletal: Strength & Muscle Tone: within normal limits Gait & Station: normal Patient leans: N/A  Psychiatric Specialty Exam  Presentation  General Appearance:  Appropriate for Environment; Casual  Eye Contact: Fair  Speech: Clear and Coherent  Speech Volume: Normal  Handedness: Right   Mood and Affect  Mood: Euthymic  Duration of Depression Symptoms: Greater than two weeks  Affect: Appropriate   Thought Process  Thought Processes: Coherent  Descriptions of Associations:Intact  Orientation:Full (Time, Place and Person)  Thought Content:Logical  History of Schizophrenia/Schizoaffective disorder:No  Duration of Psychotic Symptoms:No data recorded Hallucinations:Hallucinations: None  Ideas of Reference:None  Suicidal Thoughts:Suicidal Thoughts: No  Homicidal Thoughts:Homicidal Thoughts: No   Sensorium  Memory: Immediate Fair; Recent Fair; Remote Fair  Judgment: Fair  Insight: Fair   Art therapist  Concentration: Fair  Attention Span: Fair  Recall: [employer] of Knowledge: Fair  Language: Fair   Psychomotor Activity  Psychomotor Activity: Psychomotor Activity: Normal   Assets  Assets: Communication Skills; Desire for Improvement; Resilience; Social Support   Sleep  Sleep: Sleep: Fair  Estimated Sleeping Duration (Last 24 Hours): 9.00-11.50 hours  Physical Exam: Physical Exam ROS Blood pressure (!) 134/91, pulse 78, temperature (!) 97.2 F (36.2 C), resp. rate 18, height 5' 8 (1.727 m), weight 113.4 kg, SpO2 99%. Body mass index is 38.01 kg/m.  Mental Status Per Nursing Assessment::   On Admission:   Suicidal ideation indicated by patient  Demographic Factors:  Low socioeconomic status  Loss Factors: Decrease in vocational status  Historical Factors: Impulsivity  Risk Reduction Factors:   Positive social support, Positive therapeutic relationship, and Positive coping skills or problem solving skills  Continued Clinical Symptoms:  Depression:   Comorbid alcohol  abuse/dependence  Cognitive Features That Contribute To Risk:  None    Suicide Risk:  Minimal: No identifiable suicidal ideation.  Patients presenting with no risk factors but with morbid ruminations; may be classified as minimal risk based on the severity of the depressive symptoms    Plan Of Care/Follow-up recommendations:  Activity:  As tolerated  Allyn Foil, MD 11/15/2023, 12:06 PM

## 2023-11-15 NOTE — Progress Notes (Signed)
  [facility] Adult Case Management Discharge Plan :  Will you be returning to the same living situation after discharge:  Yes,  pt will return home  At discharge, do you have transportation home?: Yes,  CSW will assist with transportation  Do you have the ability to pay for your medications: Yes,  MEDICARE / MEDICARE PART A AND B  Release of information consent forms completed and in the chart;  Patient's signature needed at discharge.  Patient to Follow up at:  Follow-up Information     [facility] Follow up on 11/22/2023.   Why: Charles [name] has an appt on 8/4 at 12:30 via the phone and will receive a call at [phone] Contact information: [address]  [address] [place] [address] [phone]                 Next level of care provider has access to [facility] Link:no  Safety Planning and Suicide Prevention discussed: Yes,  SPE gone over with pt at discharge      Has patient been referred to the Quitline?: Patient does not use tobacco/nicotine products  Patient has been referred for addiction treatment: Patient refused referral for treatment.  [address] LCSWA 11/15/2023, 2:44 PM

## 2023-11-15 NOTE — Care Management Important Message (Signed)
 Important Message  Patient Details  Name: Charles [name] MRN: [id number] Date of Birth: 21-Feb-1961   Important Message Given:  Yes - Medicare IM     [name], LCSWA 11/15/2023, 2:46 PM

## 2023-11-15 NOTE — Group Note (Signed)
 Recreation Therapy Group Note   Group Topic:Coping Skills  Group Date: 11/15/2023 Start Time: 1405 End Time: 1500 Facilitators: Celestia Jeoffrey BRAVO, LRT, CTRS Location: Dayroom  Group Description: Coping A-Z. LRT and patients engage in a guided discussion on what coping skills are and gave specific examples. LRT passed out a handout labeled Coping A-Z with blank spaces beside each letter. LRT prompted patients to come up with a coping skill for each of the letters. LRT and patients went over the handout and gave ideas for each letter if anyone had any blanks left on their paper. Patients kept this handout with them that listed 26 different coping skills.   Goal Area(s) Addressed: Patients will be able to define "coping skills". Patient will identify new coping skills.  Patient will increase communication.   Affect/Mood: Appropriate   Participation Level: Active and Engaged   Participation Quality: Independent   Behavior: Appropriate, Calm, and Cooperative   Speech/Thought Process: Coherent   Insight: Good   Judgement: Good   Modes of Intervention: Education, Exploration, Worksheet, and Writing   Patient Response to Interventions:  Attentive, Engaged, Interested , and Receptive   Education Outcome:  Acknowledges education   Clinical Observations/Individualized Feedback: Charles [name] was active in their participation of session activities and group discussion. Pt identified petting my dog as a coping skill. Pt interacted well with LRT and peers duration of session.    Plan: Continue to engage patient in RT group sessions 2-3x/week.   Jeoffrey BRAVO Celestia, LRT, CTRS 11/15/2023 4:33 PM
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
## 2023-11-15 NOTE — Plan of Care (Signed)
  Problem: Coping: Goal: Coping ability will improve 11/15/2023 1805 by Harriette No T, RN Outcome: Progressing 11/15/2023 1648 by Harriette No T, RN Outcome: Not Progressing   Problem: Medication: Goal: Compliance with prescribed medication regimen will improve Outcome: Progressing   Problem: Education: Goal: Ability to make informed decisions regarding treatment will improve Outcome: Progressing   Problem: Education: Goal: Ability to make informed decisions regarding treatment will improve Outcome: Not Progressing

## 2023-11-15 NOTE — Progress Notes (Signed)
 Discharge Note:  Patient denies SI/HI/AVH at this time. Discharge instructions, AVS, prescriptions, and transition record reviewed with patient. Patient agrees to comply with medication management, follow-up visit, and outpatient therapy.  Patient belongings returned to patient. Patient questions and concerns addressed and answered. Patient discharged to home with [facility].   Patient competed Suicide Water engineer.

## 2023-12-15 NOTE — BH Assessment (Signed)
 Patient is on the phone with this writer asking for his follow-up information from when he was here last month. This is why this Clinical research associate is in patient's chart. This Clinical research associate transferred the phone call over to [place], [place].

## 2023-12-28 ENCOUNTER — Telehealth: Payer: Self-pay | Admitting: Internal Medicine

## 2023-12-28 NOTE — Telephone Encounter (Signed)
 MR printed. Notified patient ready to p/u @ front desk-Toni

## 2024-01-03 ENCOUNTER — Emergency Department

## 2024-01-03 ENCOUNTER — Emergency Department
Admission: EM | Admit: 2024-01-03 | Discharge: 2024-01-03 | Disposition: A | Discharge status: Home / Self Care | Attending: Emergency Medicine | Admitting: Emergency Medicine

## 2024-01-03 DIAGNOSIS — R1084 Generalized abdominal pain: Secondary | ICD-10-CM | POA: Diagnosis not present

## 2024-01-03 DIAGNOSIS — R112 Nausea with vomiting, unspecified: Secondary | ICD-10-CM | POA: Diagnosis not present

## 2024-01-03 DIAGNOSIS — I10 Essential (primary) hypertension: Secondary | ICD-10-CM | POA: Insufficient documentation

## 2024-01-03 DIAGNOSIS — R110 Nausea: Secondary | ICD-10-CM

## 2024-01-03 DIAGNOSIS — R109 Unspecified abdominal pain: Secondary | ICD-10-CM

## 2024-01-03 LAB — URINALYSIS, COMPLETE (UACMP) WITH MICROSCOPIC
Bacteria, UA: NONE SEEN
Bilirubin Urine: NEGATIVE
Glucose, UA: NEGATIVE mg/dL
Hgb urine dipstick: NEGATIVE
Ketones, ur: NEGATIVE mg/dL
Leukocytes,Ua: NEGATIVE
Nitrite: NEGATIVE
Protein, ur: NEGATIVE mg/dL
Specific Gravity, Urine: 1.019 (ref 1.005–1.030)
Squamous Epithelial / HPF: 0 /HPF (ref 0–5)
pH: 5 (ref 5.0–8.0)

## 2024-01-03 LAB — CBC
HCT: 42.4 % (ref 39.0–52.0)
Hemoglobin: 14.4 g/dL (ref 13.0–17.0)
MCH: 30.5 pg (ref 26.0–34.0)
MCHC: 34 g/dL (ref 30.0–36.0)
MCV: 89.8 fL (ref 80.0–100.0)
Platelets: 382 K/uL (ref 150–400)
RBC: 4.72 MIL/uL (ref 4.22–5.81)
RDW: 13.5 % (ref 11.5–15.5)
WBC: 8.7 K/uL (ref 4.0–10.5)
nRBC: 0 % (ref 0.0–0.2)

## 2024-01-03 LAB — COMPREHENSIVE METABOLIC PANEL WITH GFR
ALT: 35 U/L (ref 0–44)
AST: 51 U/L — ABNORMAL HIGH (ref 15–41)
Albumin: 3.3 g/dL — ABNORMAL LOW (ref 3.5–5.0)
Alkaline Phosphatase: 98 U/L (ref 38–126)
Anion gap: 16 — ABNORMAL HIGH (ref 5–15)
BUN: 9 mg/dL (ref 8–23)
CO2: 23 mmol/L (ref 22–32)
Calcium: 8.9 mg/dL (ref 8.9–10.3)
Chloride: 99 mmol/L (ref 98–111)
Creatinine, Ser: UNDETERMINED mg/dL (ref 0.61–1.24)
Glucose, Bld: 118 mg/dL — ABNORMAL HIGH (ref 70–99)
Potassium: 3.6 mmol/L (ref 3.5–5.1)
Sodium: 138 mmol/L (ref 135–145)
Total Bilirubin: 1.3 mg/dL — ABNORMAL HIGH (ref 0.0–1.2)
Total Protein: 7.3 g/dL (ref 6.5–8.1)

## 2024-01-03 LAB — CREATININE, SERUM
Creatinine, Ser: 0.57 mg/dL — ABNORMAL LOW (ref 0.61–1.24)
GFR, Estimated: >60 mL/min (ref >60)

## 2024-01-03 LAB — SAMPLE TO BLOOD BANK

## 2024-01-03 LAB — LIPASE, BLOOD: Lipase: 31 U/L (ref 11–51)

## 2024-01-03 MED ORDER — ONDANSETRON HCL 4 MG/2ML IJ SOLN
4.0000 mg | Freq: Once | INTRAMUSCULAR | Status: AC
Start: 2024-01-03 — End: 2024-01-03
  Administered 2024-01-03: 4 mg via INTRAVENOUS
  Filled 2024-01-03: qty 2

## 2024-01-03 MED ORDER — SODIUM CHLORIDE 0.9 % IV BOLUS
1000.0000 mL | Freq: Once | INTRAVENOUS | Status: AC
  Administered 2024-01-03: 1000 mL via INTRAVENOUS

## 2024-01-03 MED ORDER — MORPHINE SULFATE (PF) 4 MG/ML IV SOLN
4.0000 mg | Freq: Once | INTRAVENOUS | Status: AC
  Administered 2024-01-03: 4 mg via INTRAVENOUS
  Filled 2024-01-03: qty 1

## 2024-01-03 MED ORDER — ONDANSETRON 4 MG PO TBDP: 4.0000 mg | ORAL_TABLET | Freq: Four times a day (QID) | ORAL | 0 refills | Status: DC | PRN

## 2024-01-03 MED ORDER — IOHEXOL 300 MG/ML  SOLN
100.0000 mL | Freq: Once | INTRAMUSCULAR | Status: AC | PRN
  Administered 2024-01-03: 100 mL via INTRAVENOUS

## 2024-01-03 NOTE — Discharge Instructions (Signed)
 ? ?  Please return to the emergency room right away if you are to develop a fever, severe nausea, your pain becomes severe or worsens, you are unable to keep food down, begin vomiting any dark or bloody fluid, you develop any dark or bloody stools, feel dehydrated, or other new concerns or symptoms arise. ? ?

## 2024-01-03 NOTE — ED Notes (Signed)
 [facility] called for  transport  home

## 2024-01-03 NOTE — ED Provider Notes (Signed)
 [facility] Provider Note    Event Date/Time   First MD Initiated Contact with Patient 01/03/24 1102     (approximate)  History   Chief Complaint: Abdominal Pain  HPI  Charles [name] is a 63 y.o. male with a past medical history of anxiety, hypertension, hyperlipidemia, prior MI, left lower extremity amputation/AKA, presents to the emergency department for abdominal pain.  According to the patient since yesterday patient has been experiencing pain throughout the entire abdomen also noted some blood within his stool last night.  No urinary symptoms.  No fever.  States nausea vomiting as well as some loose stool.  Physical Exam   Triage Vital Signs: ED Triage Vitals  Encounter Vitals Group     BP 01/03/24 1031 (!) 156/98     Girls Systolic BP Percentile --      Girls Diastolic BP Percentile --      Boys Systolic BP Percentile --      Boys Diastolic BP Percentile --      Pulse Rate 01/03/24 1031 84     Resp 01/03/24 1031 18     Temp 01/03/24 1031 98.1 F (36.7 C)     Temp Source 01/03/24 1031 Oral     SpO2 01/03/24 1031 98 %     Weight 01/03/24 1037 250 lb (113.4 kg)     Height 01/03/24 1037 5' 8 (1.727 m)     Head Circumference --      Peak Flow --      Pain Score 01/03/24 1037 9     Pain Loc --      Pain Education --      Exclude from Growth Chart --     Most recent vital signs: Vitals:   01/03/24 1031 01/03/24 1100  BP: (!) 156/98 135/85  Pulse: 84 79  Resp: 18 18  Temp: 98.1 F (36.7 C)   SpO2: 98% 97%    General: Awake, no distress.  CV:  Good peripheral perfusion.  Regular rate and rhythm  Resp:  Normal effort.  Equal breath sounds bilaterally.  Abd:  No distention.  Soft, mild diffuse tenderness without focal tenderness identified.  No rebound or guarding. Other:  Left AKA   ED Results / Procedures / Treatments   EKG  EKG viewed and interpreted by myself shows a sinus rhythm at 87 bpm with a narrow QRS, left axis  deviation, QTc prolongation otherwise normal intervals, nonspecific but no concerning ST changes.  RADIOLOGY  I have reviewed and interpreted the CT images.  No obstruction or significant abnormality to my evaluation awaiting radiology read.   MEDICATIONS ORDERED IN ED: Medications  morphine  (PF) 4 MG/ML injection 4 mg (4 mg Intravenous Given 01/03/24 1122)  ondansetron  (ZOFRAN ) injection 4 mg (4 mg Intravenous Given 01/03/24 1121)  sodium chloride  0.9 % bolus 1,000 mL (1,000 mLs Intravenous New Bag/Given 01/03/24 1124)     IMPRESSION / MDM / ASSESSMENT AND PLAN / ED COURSE  I reviewed the triage vital signs and the nursing notes.  Patient's presentation is most consistent with acute presentation with potential threat to life or bodily function.  Patient presents to the emergency department for abdominal pain nausea vomiting noted a small amount of blood in his stool as well.  Overall patient appears well, no distress.  Mild diffuse tenderness without focal tenderness identified.  No rebound or guarding.  Patient has a history of chronic pain and has a pain pump.  Will dose a  small amount of additional pain medication, nausea medication and fluids.  We will check labs and likely proceed with CT imaging of the abdomen and pelvis to further evaluate.  Patient agreeable to plan of care.  CBC is normal with a normal white blood cell count chemistry overall reassuring.  Urinalysis shows no concerning finding.  CT read pending.  Patient care signed out to oncoming provider.  FINAL CLINICAL IMPRESSION(S) / ED DIAGNOSES   Abdominal pain Nausea vomiting   Note:  This document was prepared using Dragon voice recognition software and may include unintentional dictation errors.   Dorothyann Drivers, MD 01/03/24 1500

## 2024-01-03 NOTE — ED Triage Notes (Signed)
 Pt presents to the ED via [facility] from home. Pt reports sudden onset of abdominal pain last night. Reports N/V/D. Pt reports bright red blood in stool.   BP 158/106 4mg  of zofran  IM given by EMS pta HR 77 98% RA RR 16

## 2024-01-03 NOTE — ED Provider Notes (Signed)
 Received patient in signout.  Having nausea vomiting and abdominal pain for the last couple of days.  Reassuring workup including labs.  Currently awaiting CT abdomen pelvis  Vitals:   01/03/24 1500 01/03/24 1533  BP: (!) 143/87   Pulse: 77   Resp:    Temp:  98 F (36.7 C)  SpO2: 96%    CT ABDOMEN PELVIS W CONTRAST Result Date: 01/03/2024 CLINICAL DATA:  Abdominal pain, acute, nonlocalized EXAM: CT ABDOMEN AND PELVIS WITH CONTRAST TECHNIQUE: Multidetector CT imaging of the abdomen and pelvis was performed using the standard protocol following bolus administration of intravenous contrast. RADIATION DOSE REDUCTION: This exam was performed according to the departmental dose-optimization program which includes automated exposure control, adjustment of the mA and/or kV according to patient size and/or use of iterative reconstruction technique. CONTRAST:  OMNIPAQUE  IOHEXOL  300 MG/ML  SOLN COMPARISON:  March 1 22 FINDINGS: Lower chest: No focal airspace consolidation or pleural effusion.Posterior bibasilar dependent atelectasis. Hepatobiliary: No mass.Diffuse hepatic steatosis.Single large radiopaque gallstone. No wall thickening. No intrahepatic or extrahepatic biliary ductal dilation. The portal veins are patent. Pancreas: Diffuse fatty atrophy of the pancreatic parenchyma. No mass or ductal dilation. No peripancreatic inflammation or fluid collection. Spleen: Normal size. No mass. Adrenals/Urinary Tract: No adrenal masses. No renal mass. No nephrolithiasis or hydronephrosis. The urinary bladder is completely decompressed. Stomach/Bowel: Postsurgical changes prior gastric bypass. The excluded stomach is decompressed. No small bowel wall thickening or inflammation. No small bowel obstruction.Normal appendix. Vascular/Lymphatic: No aortic aneurysm. Scattered aortoiliac atherosclerosis. No intraabdominal or pelvic lymphadenopathy. Reproductive: No prostatomegaly.No free pelvic fluid. Other: No  pneumoperitoneum, ascites, or mesenteric inflammation. Musculoskeletal: No acute fracture or destructive lesion. Medication infusion device along the right lateral flank. Postsurgical changes of prior thoracic fusion. Multilevel degenerative disc disease of the spine. Small fat containing inguinal hernias. Unchanged sclerotic lesion in the left superior pubic ramus, benign. IMPRESSION: No acute intra-abdominal or pelvic abnormality. Aortic Atherosclerosis (ICD10-I70.0). Electronically Signed   By: Rogelia Myers M.D.   On: 01/03/2024 15:13    Patient resting.  Reviewed his CT findings with him which are reassuring.  Patient notes that he would be comfortable with plan to follow-up with [facility] family practice where he is just transitioning his care to and reports he just received his medical records from Dr. Gleda office   Return precautions and treatment recommendations and follow-up discussed with the patient who is agreeable with the plan.     Dicky Anes, MD 01/03/24 [phone]

## 2024-01-14 ENCOUNTER — Other Ambulatory Visit: Payer: Self-pay

## 2024-01-14 ENCOUNTER — Emergency Department
Admission: EM | Admit: 2024-01-14 | Discharge: 2024-01-14 | Disposition: A | Discharge status: Home / Self Care | Attending: Emergency Medicine | Admitting: Emergency Medicine

## 2024-01-14 DIAGNOSIS — I10 Essential (primary) hypertension: Secondary | ICD-10-CM | POA: Diagnosis not present

## 2024-01-14 DIAGNOSIS — R110 Nausea: Secondary | ICD-10-CM

## 2024-01-14 DIAGNOSIS — R1032 Left lower quadrant pain: Secondary | ICD-10-CM | POA: Insufficient documentation

## 2024-01-14 DIAGNOSIS — E860 Dehydration: Secondary | ICD-10-CM | POA: Insufficient documentation

## 2024-01-14 DIAGNOSIS — R197 Diarrhea, unspecified: Secondary | ICD-10-CM | POA: Insufficient documentation

## 2024-01-14 DIAGNOSIS — E119 Type 2 diabetes mellitus without complications: Secondary | ICD-10-CM | POA: Diagnosis not present

## 2024-01-14 DIAGNOSIS — R112 Nausea with vomiting, unspecified: Secondary | ICD-10-CM | POA: Insufficient documentation

## 2024-01-14 DIAGNOSIS — R109 Unspecified abdominal pain: Secondary | ICD-10-CM

## 2024-01-14 LAB — URINALYSIS, COMPLETE (UACMP) WITH MICROSCOPIC
Bacteria, UA: NONE SEEN
Bilirubin Urine: NEGATIVE
Glucose, UA: NEGATIVE mg/dL
Hgb urine dipstick: NEGATIVE
Ketones, ur: NEGATIVE mg/dL
Leukocytes,Ua: NEGATIVE
Nitrite: NEGATIVE
Protein, ur: NEGATIVE mg/dL
Specific Gravity, Urine: 1.01 (ref 1.005–1.030)
Squamous Epithelial / HPF: 0 /HPF (ref 0–5)
pH: 6 (ref 5.0–8.0)

## 2024-01-14 LAB — COMPREHENSIVE METABOLIC PANEL WITH GFR
ALT: 47 U/L — ABNORMAL HIGH (ref 0–44)
AST: 123 U/L — ABNORMAL HIGH (ref 15–41)
Albumin: 3 g/dL — ABNORMAL LOW (ref 3.5–5.0)
Alkaline Phosphatase: 108 U/L (ref 38–126)
Anion gap: 19 — ABNORMAL HIGH (ref 5–15)
BUN: 6 mg/dL — ABNORMAL LOW (ref 8–23)
CO2: 24 mmol/L (ref 22–32)
Calcium: 8.3 mg/dL — ABNORMAL LOW (ref 8.9–10.3)
Chloride: 94 mmol/L — ABNORMAL LOW (ref 98–111)
Creatinine, Ser: 1.02 mg/dL (ref 0.61–1.24)
GFR, Estimated: >60 mL/min (ref >60)
Glucose, Bld: 153 mg/dL — ABNORMAL HIGH (ref 70–99)
Potassium: 3.4 mmol/L — ABNORMAL LOW (ref 3.5–5.1)
Sodium: 137 mmol/L (ref 135–145)
Total Bilirubin: 1.8 mg/dL — ABNORMAL HIGH (ref 0.0–1.2)
Total Protein: 6.6 g/dL (ref 6.5–8.1)

## 2024-01-14 LAB — CBC
HCT: 40 % (ref 39.0–52.0)
Hemoglobin: 13.6 g/dL (ref 13.0–17.0)
MCH: 30.1 pg (ref 26.0–34.0)
MCHC: 34 g/dL (ref 30.0–36.0)
MCV: 88.5 fL (ref 80.0–100.0)
Platelets: 234 K/uL (ref 150–400)
RBC: 4.52 MIL/uL (ref 4.22–5.81)
RDW: 14.7 % (ref 11.5–15.5)
WBC: 8.9 K/uL (ref 4.0–10.5)
nRBC: 0 % (ref 0.0–0.2)

## 2024-01-14 LAB — BASIC METABOLIC PANEL WITH GFR
Anion gap: 15 (ref 5–15)
BUN: 5 mg/dL — ABNORMAL LOW (ref 8–23)
CO2: 25 mmol/L (ref 22–32)
Calcium: 8.1 mg/dL — ABNORMAL LOW (ref 8.9–10.3)
Chloride: 97 mmol/L — ABNORMAL LOW (ref 98–111)
Creatinine, Ser: 0.86 mg/dL (ref 0.61–1.24)
GFR, Estimated: >60 mL/min (ref >60)
Glucose, Bld: 123 mg/dL — ABNORMAL HIGH (ref 70–99)
Potassium: 3.6 mmol/L (ref 3.5–5.1)
Sodium: 137 mmol/L (ref 135–145)

## 2024-01-14 LAB — LIPASE, BLOOD: Lipase: 17 U/L (ref 11–51)

## 2024-01-14 MED ORDER — MORPHINE SULFATE (PF) 4 MG/ML IV SOLN
4.0000 mg | Freq: Once | INTRAVENOUS | Status: AC
  Administered 2024-01-14: 4 mg via INTRAVENOUS
  Filled 2024-01-14: qty 1

## 2024-01-14 MED ORDER — LACTATED RINGERS IV BOLUS
1000.0000 mL | Freq: Once | INTRAVENOUS | Status: AC
Start: 2024-01-14 — End: 2024-01-14
  Administered 2024-01-14: 1000 mL via INTRAVENOUS

## 2024-01-14 MED ORDER — ONDANSETRON 4 MG PO TBDP: 4.0000 mg | ORAL_TABLET | Freq: Three times a day (TID) | ORAL | 0 refills | Status: AC | PRN

## 2024-01-14 MED ORDER — ONDANSETRON HCL 4 MG/2ML IJ SOLN
4.0000 mg | Freq: Once | INTRAMUSCULAR | Status: AC
  Administered 2024-01-14: 4 mg via INTRAVENOUS
  Filled 2024-01-14: qty 2

## 2024-01-14 MED ORDER — SODIUM CHLORIDE 0.9 % IV BOLUS
1000.0000 mL | Freq: Once | INTRAVENOUS | Status: AC
  Administered 2024-01-14: 1000 mL via INTRAVENOUS

## 2024-01-14 NOTE — Discharge Instructions (Addendum)
 As we discussed please take your nausea medication as needed, as prescribed.  Please drink plenty of fluids.  Please follow-up with your doctor for recheck/reevaluation.  Return to the emergency department for any worsening pain, fever, or any other symptom concerning to yourself.

## 2024-01-14 NOTE — ED Notes (Signed)
 Provided patient with a urinal for UA sample.

## 2024-01-14 NOTE — ED Provider Notes (Signed)
 [facility] Provider Note    Event Date/Time   First MD Initiated Contact with Patient 01/14/24 [phone]     (approximate)  History   Chief Complaint: Abdominal Pain  HPI  Charles [name] is a 63 y.o. male with a past medical history of anxiety, chronic pain with a fentanyl  pump, hypertension, diabetes, presents to the emergency department for left lower quadrant abdominal pain.  According to the patient he has been experiencing intermittent left lower quadrant abdominal pain times weeks or months.  Patient states it was worse this morning along with nausea and vomiting.  Patient was seen by myself for the same less than 2 weeks ago at that time had a reassuring workup including labs and a CT scan.  Patient denies any fever.  States he has been dry heaving but little vomit.  Has had some loose stool.  Physical Exam   Triage Vital Signs: ED Triage Vitals  Encounter Vitals Group     BP 01/14/24 0711 (!) 157/99     Girls Systolic BP Percentile --      Girls Diastolic BP Percentile --      Boys Systolic BP Percentile --      Boys Diastolic BP Percentile --      Pulse Rate 01/14/24 0711 (!) 120     Resp 01/14/24 0711 20     Temp 01/14/24 0711 98.4 F (36.9 C)     Temp Source 01/14/24 0711 Oral     SpO2 01/14/24 0711 95 %     Weight 01/14/24 0708 250 lb (113.4 kg)     Height 01/14/24 0708 5' 8 (1.727 m)     Head Circumference --      Peak Flow --      Pain Score 01/14/24 0708 8     Pain Loc --      Pain Education --      Exclude from Growth Chart --     Most recent vital signs: Vitals:   01/14/24 0711 01/14/24 0715  BP: (!) 157/99 (!) 157/99  Pulse: (!) 120 (!) 115  Resp: 20   Temp: 98.4 F (36.9 C)   SpO2: 95%     General: Awake, no distress.  CV:  Good peripheral perfusion.  Regular rate and rhythm  Resp:  Normal effort.  Equal breath sounds bilaterally.  Abd:  No distention.  Soft, mild diffuse tenderness but no focal tenderness identified.   No rebound or guarding.  ED Results / Procedures / Treatments   EKG  EKG viewed and interpreted by myself shows sinus tachycardia 116 bpm with a narrow QRS, normal axis, normal intervals, nonspecific ST changes.  MEDICATIONS ORDERED IN ED: Medications - No data to display   IMPRESSION / MDM / ASSESSMENT AND PLAN / ED COURSE  I reviewed the triage vital signs and the nursing notes.  Patient's presentation is most consistent with acute presentation with potential threat to life or bodily function.  Patient presents to the emergency department for abdominal pain and nausea.  Patient has a history of the same, was seen by myself less than 2 weeks ago for the same with a negative workup including a CT scan of the abdomen and pelvis.  We will check labs we will treat pain nausea and IV hydrate the patient.  Given his reassuring CT scan if his lab work is not significantly different I do not believe repeat imaging would necessarily be of benefit to the patient, we will  discuss after labs have resulted and see how the patient responds to medication.  Patient agreeable to plan of care.  Overall appears well, no distress and nontoxic in appearance.  Patient's workup is reassuring with a normal CBC with a normal white blood cell count.  Chemistry shows no significant finding besides signs of dehydration with anion gap of 19.  Patient's LFTs are mildly elevated but unchanged from historical values.  Patient has received IV fluids as well as pain and nausea medication.  Patient states he is feeling better  Patient's lab work is improved after fluids.  Discussed with the patient the importance of continuing to drink significant fluids at home.  Patient agreeable to plan of care.  Given the otherwise reassuring lab work we will discharge patient home with outpatient follow-up.  Patient agreeable to plan.  FINAL CLINICAL IMPRESSION(S) / ED DIAGNOSES   Abdominal pain Nausea Dehydration  Note:  This  document was prepared using Dragon voice recognition software and may include unintentional dictation errors.   Dorothyann Drivers, MD 01/14/24 1146

## 2024-01-14 NOTE — ED Triage Notes (Signed)
 coming from home. CO nausea and vomiting and general abd pain since yesterday. HX of 2 stents. Patietn recieved 4mg  of zofran  from EMS. pt dry heaving upon arrival.    CBG: 127  18LAC

## 2024-02-01 ENCOUNTER — Emergency Department
Admission: EM | Admit: 2024-02-01 | Discharge: 2024-02-01 | Discharge status: Against Medical Advice | Source: Home / Self Care

## 2024-02-01 ENCOUNTER — Other Ambulatory Visit: Payer: Self-pay

## 2024-02-01 ENCOUNTER — Emergency Department
Admission: EM | Admit: 2024-02-01 | Discharge: 2024-02-01 | Disposition: A | Discharge status: Home / Self Care | Attending: Emergency Medicine | Admitting: Emergency Medicine

## 2024-02-01 ENCOUNTER — Emergency Department

## 2024-02-01 DIAGNOSIS — E119 Type 2 diabetes mellitus without complications: Secondary | ICD-10-CM | POA: Diagnosis not present

## 2024-02-01 DIAGNOSIS — Z5321 Procedure and treatment not carried out due to patient leaving prior to being seen by health care provider: Secondary | ICD-10-CM | POA: Insufficient documentation

## 2024-02-01 DIAGNOSIS — I2510 Atherosclerotic heart disease of native coronary artery without angina pectoris: Secondary | ICD-10-CM | POA: Diagnosis not present

## 2024-02-01 DIAGNOSIS — R0789 Other chest pain: Secondary | ICD-10-CM | POA: Insufficient documentation

## 2024-02-01 DIAGNOSIS — R079 Chest pain, unspecified: Secondary | ICD-10-CM | POA: Insufficient documentation

## 2024-02-01 DIAGNOSIS — R112 Nausea with vomiting, unspecified: Secondary | ICD-10-CM | POA: Diagnosis not present

## 2024-02-01 DIAGNOSIS — I77810 Thoracic aortic ectasia: Secondary | ICD-10-CM | POA: Diagnosis not present

## 2024-02-01 LAB — CBC
HCT: 38.3 % — ABNORMAL LOW (ref 39.0–52.0)
Hemoglobin: 12.5 g/dL — ABNORMAL LOW (ref 13.0–17.0)
MCH: 29.3 pg (ref 26.0–34.0)
MCHC: 32.6 g/dL (ref 30.0–36.0)
MCV: 89.7 fL (ref 80.0–100.0)
Platelets: 226 K/uL (ref 150–400)
RBC: 4.27 MIL/uL (ref 4.22–5.81)
RDW: 17.5 % — ABNORMAL HIGH (ref 11.5–15.5)
WBC: 3.9 K/uL — ABNORMAL LOW (ref 4.0–10.5)
nRBC: 0 % (ref 0.0–0.2)

## 2024-02-01 LAB — BASIC METABOLIC PANEL WITH GFR
Anion gap: 21 — ABNORMAL HIGH (ref 5–15)
Anion gap: 20 — ABNORMAL HIGH (ref 5–15)
BUN: 7 mg/dL — ABNORMAL LOW (ref 8–23)
BUN: 7 mg/dL — ABNORMAL LOW (ref 8–23)
CO2: 20 mmol/L — ABNORMAL LOW (ref 22–32)
CO2: 22 mmol/L (ref 22–32)
Calcium: 8.8 mg/dL — ABNORMAL LOW (ref 8.9–10.3)
Calcium: 8.9 mg/dL (ref 8.9–10.3)
Chloride: 101 mmol/L (ref 98–111)
Chloride: 98 mmol/L (ref 98–111)
Creatinine, Ser: 0.79 mg/dL (ref 0.61–1.24)
Creatinine, Ser: 0.71 mg/dL (ref 0.61–1.24)
GFR, Estimated: >60 mL/min (ref >60)
GFR, Estimated: >60 mL/min (ref >60)
Glucose, Bld: 108 mg/dL — ABNORMAL HIGH (ref 70–99)
Glucose, Bld: 98 mg/dL (ref 70–99)
Potassium: 3.4 mmol/L — ABNORMAL LOW (ref 3.5–5.1)
Potassium: 3.9 mmol/L (ref 3.5–5.1)
Sodium: 142 mmol/L (ref 135–145)
Sodium: 140 mmol/L (ref 135–145)

## 2024-02-01 LAB — TROPONIN I (HIGH SENSITIVITY)
Troponin I (High Sensitivity): 8 ng/L (ref <18)
Troponin I (High Sensitivity): 9 ng/L (ref <18)

## 2024-02-01 NOTE — ED Triage Notes (Signed)
 Pt arrives via EMS from home for c/o CP; seen for same earlier today

## 2024-02-01 NOTE — ED Triage Notes (Signed)
 Pt to ED via EMS from home, pt was seen earlier today for chest pain and is back with complaints of the same, Pt reports pain never improved from when he was seen here earlier today. Hx MI

## 2024-02-01 NOTE — ED Triage Notes (Addendum)
 Pt to ED via [facility] from home. Pt reports left sided CP and N/V x2-3 days. Pt has home aide who checked on him today and he was clenching his chest and called EMS. Hx of MI  143/92 80 HR NSR  98.1 oral CBG 125 95% RA   324mg  ASA 20g RAC

## 2024-02-01 NOTE — ED Provider Notes (Signed)
 [facility] Provider Note    Event Date/Time   First MD Initiated Contact with Patient 02/01/24 [phone]     (approximate)   History   Chest Pain   HPI  Charles [name] is a 63 y.o. male with a history of coronary artery disease, diabetes, major depressive disorder, history of substance abuse who presents with complaints of chest pain.  He describes left-sided chest discomfort, nausea for the last 2 to 3 days.  No fevers chills, no shortness of breath.     Physical Exam   Triage Vital Signs: ED Triage Vitals  Encounter Vitals Group     BP 02/01/24 0912 123/86     Girls Systolic BP Percentile --      Girls Diastolic BP Percentile --      Boys Systolic BP Percentile --      Boys Diastolic BP Percentile --      Pulse Rate 02/01/24 0912 99     Resp 02/01/24 0912 17     Temp 02/01/24 0912 98.2 F (36.8 C)     Temp Source 02/01/24 0912 Oral     SpO2 02/01/24 0912 96 %     Weight --      Height --      Head Circumference --      Peak Flow --      Pain Score 02/01/24 0910 7     Pain Loc --      Pain Education --      Exclude from Growth Chart --     Most recent vital signs: Vitals:   02/01/24 0912  BP: 123/86  Pulse: 99  Resp: 17  Temp: 98.2 F (36.8 C)  SpO2: 96%     General: Awake, no distress.  CV:  Good peripheral perfusion.  Regular rate and rhythm Resp:  Normal effort.  Clear to auscultation bilaterally Abd:  No distention.  Other:  No calf pain or swelling   ED Results / Procedures / Treatments   Labs (all labs ordered are listed, but only abnormal results are displayed) Labs Reviewed  BASIC METABOLIC PANEL WITH GFR - Abnormal; Notable for the following components:      Result Value   Potassium 3.4 (*)    CO2 20 (*)    Glucose, Bld 108 (*)    BUN 7 (*)    Calcium  8.8 (*)    Anion gap 21 (*)    All other components within normal limits  CBC - Abnormal; Notable for the following components:   WBC 3.9 (*)    Hemoglobin  12.5 (*)    HCT 38.3 (*)    RDW 17.5 (*)    All other components within normal limits  TROPONIN I (HIGH SENSITIVITY)  TROPONIN I (HIGH SENSITIVITY)     EKG  ED ECG REPORT I, Lamar Price, the attending physician, personally viewed and interpreted this ECG.  Date: 02/01/2024  Rhythm: normal sinus rhythm QRS Axis: normal Intervals: normal ST/T Wave abnormalities: normal Narrative Interpretation: no evidence of acute ischemia    RADIOLOGY Chest x-ray viewed interpreted by me, no acute abnormality    PROCEDURES:  Critical Care performed:   Procedures   MEDICATIONS ORDERED IN ED: Medications - No data to display   IMPRESSION / MDM / ASSESSMENT AND PLAN / ED COURSE  I reviewed the triage vital signs and the nursing notes. Patient's presentation is most consistent with acute presentation with potential threat to life or bodily function.  Patient with a history of CAD presents with complaints of left-sided chest discomfort.  Differential includes ACS, angina, musculoskeletal pain, pneumonia  EKG is reassuring, pending high sensitive troponin, x-ray, labs  High sensitive troponin is normal, x-ray lab work otherwise reassuring, patient reports he is chest pain-free at this time, no indication for admission at this time, will refer to cardiology for outpatient follow-up, return precautions discussed, he agrees to this plan      FINAL CLINICAL IMPRESSION(S) / ED DIAGNOSES   Final diagnoses:  Atypical chest pain     Rx / DC Orders   ED Discharge Orders          Ordered    Ambulatory referral to Cardiology       Comments: If you have not heard from the Cardiology office within the next 72 hours please call [phone].   02/01/24 1036             Note:  This document was prepared using Dragon voice recognition software and may include unintentional dictation errors.   Arlander Charleston, MD 02/01/24 [phone]

## 2024-02-02 ENCOUNTER — Emergency Department
Admission: EM | Admit: 2024-02-02 | Discharge: 2024-02-02 | Discharge status: Against Medical Advice | Attending: Emergency Medicine | Admitting: Emergency Medicine

## 2024-02-02 ENCOUNTER — Emergency Department

## 2024-02-02 ENCOUNTER — Other Ambulatory Visit: Payer: Self-pay

## 2024-02-02 DIAGNOSIS — R079 Chest pain, unspecified: Secondary | ICD-10-CM | POA: Insufficient documentation

## 2024-02-02 DIAGNOSIS — M8448XA Pathological fracture, other site, initial encounter for fracture: Secondary | ICD-10-CM | POA: Diagnosis not present

## 2024-02-02 DIAGNOSIS — Z5321 Procedure and treatment not carried out due to patient leaving prior to being seen by health care provider: Secondary | ICD-10-CM | POA: Diagnosis not present

## 2024-02-02 LAB — TROPONIN I (HIGH SENSITIVITY): Troponin I (High Sensitivity): 9 ng/L (ref <18)

## 2024-02-02 LAB — CBC
HCT: 36.8 % — ABNORMAL LOW (ref 39.0–52.0)
Hemoglobin: 12.3 g/dL — ABNORMAL LOW (ref 13.0–17.0)
MCH: 29.9 pg (ref 26.0–34.0)
MCHC: 33.4 g/dL (ref 30.0–36.0)
MCV: 89.5 fL (ref 80.0–100.0)
Platelets: 216 K/uL (ref 150–400)
RBC: 4.11 MIL/uL — ABNORMAL LOW (ref 4.22–5.81)
RDW: 17.9 % — ABNORMAL HIGH (ref 11.5–15.5)
WBC: 5.2 K/uL (ref 4.0–10.5)
nRBC: 0 % (ref 0.0–0.2)

## 2024-02-02 LAB — BASIC METABOLIC PANEL WITH GFR
Anion gap: 19 — ABNORMAL HIGH (ref 5–15)
BUN: 6 mg/dL — ABNORMAL LOW (ref 8–23)
CO2: 21 mmol/L — ABNORMAL LOW (ref 22–32)
Calcium: 8.8 mg/dL — ABNORMAL LOW (ref 8.9–10.3)
Chloride: 99 mmol/L (ref 98–111)
Creatinine, Ser: 0.82 mg/dL (ref 0.61–1.24)
GFR, Estimated: >60 mL/min (ref >60)
Glucose, Bld: 125 mg/dL — ABNORMAL HIGH (ref 70–99)
Potassium: 3.7 mmol/L (ref 3.5–5.1)
Sodium: 139 mmol/L (ref 135–145)

## 2024-02-02 NOTE — ED Triage Notes (Signed)
 First Nurse Note: Patient to ED via [facility] from home for CP. Seen twice yesterday for same. 7/10 throbbing pain. Given 324 ASA by EMS. VS WNL

## 2024-02-02 NOTE — ED Notes (Addendum)
 Attempted to room patient. PT stating that he no longer wants to be seen and has called a cab to take him home. PT encouraged to stay but declined. NAD noted. Aox4.

## 2024-02-03 ENCOUNTER — Emergency Department

## 2024-02-03 ENCOUNTER — Other Ambulatory Visit: Payer: Self-pay

## 2024-02-03 ENCOUNTER — Encounter: Payer: Self-pay | Admitting: Emergency Medicine

## 2024-02-03 ENCOUNTER — Emergency Department
Admission: EM | Admit: 2024-02-03 | Discharge: 2024-02-03 | Discharge status: Against Medical Advice | Attending: Emergency Medicine | Admitting: Emergency Medicine

## 2024-02-03 DIAGNOSIS — Z5321 Procedure and treatment not carried out due to patient leaving prior to being seen by health care provider: Secondary | ICD-10-CM | POA: Insufficient documentation

## 2024-02-03 DIAGNOSIS — S0993XA Unspecified injury of face, initial encounter: Secondary | ICD-10-CM | POA: Diagnosis not present

## 2024-02-03 DIAGNOSIS — S0990XA Unspecified injury of head, initial encounter: Secondary | ICD-10-CM | POA: Diagnosis not present

## 2024-02-03 DIAGNOSIS — Y92002 Bathroom of unspecified non-institutional (private) residence single-family (private) house as the place of occurrence of the external cause: Secondary | ICD-10-CM | POA: Diagnosis not present

## 2024-02-03 DIAGNOSIS — Y908 Blood alcohol level of 240 mg/100 ml or more: Secondary | ICD-10-CM | POA: Diagnosis not present

## 2024-02-03 DIAGNOSIS — R079 Chest pain, unspecified: Secondary | ICD-10-CM | POA: Diagnosis not present

## 2024-02-03 DIAGNOSIS — W1812XA Fall from or off toilet with subsequent striking against object, initial encounter: Secondary | ICD-10-CM | POA: Insufficient documentation

## 2024-02-03 DIAGNOSIS — S2231XA Fracture of one rib, right side, initial encounter for closed fracture: Secondary | ICD-10-CM | POA: Diagnosis not present

## 2024-02-03 DIAGNOSIS — F1010 Alcohol abuse, uncomplicated: Secondary | ICD-10-CM | POA: Insufficient documentation

## 2024-02-03 DIAGNOSIS — R0789 Other chest pain: Secondary | ICD-10-CM | POA: Insufficient documentation

## 2024-02-03 DIAGNOSIS — S199XXA Unspecified injury of neck, initial encounter: Secondary | ICD-10-CM | POA: Diagnosis not present

## 2024-02-03 DIAGNOSIS — M50321 Other cervical disc degeneration at C4-C5 level: Secondary | ICD-10-CM | POA: Diagnosis not present

## 2024-02-03 DIAGNOSIS — W19XXXA Unspecified fall, initial encounter: Secondary | ICD-10-CM | POA: Diagnosis not present

## 2024-02-03 LAB — CBC
HCT: 37.6 % — ABNORMAL LOW (ref 39.0–52.0)
Hemoglobin: 12.3 g/dL — ABNORMAL LOW (ref 13.0–17.0)
MCH: 29.4 pg (ref 26.0–34.0)
MCHC: 32.7 g/dL (ref 30.0–36.0)
MCV: 90 fL (ref 80.0–100.0)
Platelets: 193 K/uL (ref 150–400)
RBC: 4.18 MIL/uL — ABNORMAL LOW (ref 4.22–5.81)
RDW: 17.7 % — ABNORMAL HIGH (ref 11.5–15.5)
WBC: 8.2 K/uL (ref 4.0–10.5)
nRBC: 0.2 % (ref 0.0–0.2)

## 2024-02-03 LAB — BASIC METABOLIC PANEL WITH GFR
Anion gap: 20 — ABNORMAL HIGH (ref 5–15)
BUN: 8 mg/dL (ref 8–23)
CO2: 18 mmol/L — ABNORMAL LOW (ref 22–32)
Calcium: 8.6 mg/dL — ABNORMAL LOW (ref 8.9–10.3)
Chloride: 95 mmol/L — ABNORMAL LOW (ref 98–111)
Creatinine, Ser: 0.81 mg/dL (ref 0.61–1.24)
GFR, Estimated: >60 mL/min (ref >60)
Glucose, Bld: 127 mg/dL — ABNORMAL HIGH (ref 70–99)
Potassium: 3.5 mmol/L (ref 3.5–5.1)
Sodium: 133 mmol/L — ABNORMAL LOW (ref 135–145)

## 2024-02-03 LAB — TROPONIN I (HIGH SENSITIVITY): Troponin I (High Sensitivity): 12 ng/L (ref <18)

## 2024-02-03 NOTE — ED Triage Notes (Signed)
 BIB [facility] from home. C/O Chest Pain.  Has been seen through ED for same over past 3 days.  VS wnl.. 1 NTG given, 324 ASA given.  18 g PIV RAC  ST 12 lead wnl.

## 2024-02-03 NOTE — ED Triage Notes (Signed)
 Pt via [facility] from home. Pt c/o L sided chest pain for the last 8 hours. Non-radiating. Denies any SOB. Denies taking any blood thinners. Pt was seen for same yesterday. Pt is A&OX4 and NAD

## 2024-02-03 NOTE — ED Triage Notes (Signed)
 Pt arrives ems from c/o cp and fall. Pt here earlier today for cp and left due to wait time. Pt continues to have cp. Pt reports falling out of WC in driveway on cement. Pt states he hit his head, denies LOC, denies blood thinners. Pt reports having 1/2 of a fifth of liqour. NADN.

## 2024-02-03 NOTE — ED Notes (Signed)
 No answer when called several times from lobby; called phone # listed in chart & pt st already left

## 2024-02-04 ENCOUNTER — Emergency Department
Admission: EM | Admit: 2024-02-04 | Discharge: 2024-02-04 | Discharge status: Against Medical Advice | Source: Home / Self Care

## 2024-02-04 LAB — ETHANOL: Alcohol, Ethyl (B): 255 mg/dL — ABNORMAL HIGH (ref <15)

## 2024-02-04 LAB — TROPONIN I (HIGH SENSITIVITY): Troponin I (High Sensitivity): 11 ng/L (ref <18)

## 2024-02-10 ENCOUNTER — Emergency Department

## 2024-02-10 ENCOUNTER — Other Ambulatory Visit: Payer: Self-pay

## 2024-02-10 ENCOUNTER — Emergency Department
Admission: EM | Admit: 2024-02-10 | Discharge: 2024-02-10 | Disposition: A | Discharge status: Home / Self Care | Attending: Emergency Medicine | Admitting: Emergency Medicine

## 2024-02-10 DIAGNOSIS — S0990XA Unspecified injury of head, initial encounter: Secondary | ICD-10-CM | POA: Insufficient documentation

## 2024-02-10 DIAGNOSIS — Y908 Blood alcohol level of 240 mg/100 ml or more: Secondary | ICD-10-CM | POA: Insufficient documentation

## 2024-02-10 DIAGNOSIS — F10920 Alcohol use, unspecified with intoxication, uncomplicated: Secondary | ICD-10-CM | POA: Insufficient documentation

## 2024-02-10 DIAGNOSIS — M4802 Spinal stenosis, cervical region: Secondary | ICD-10-CM | POA: Diagnosis not present

## 2024-02-10 DIAGNOSIS — I10 Essential (primary) hypertension: Secondary | ICD-10-CM | POA: Diagnosis not present

## 2024-02-10 DIAGNOSIS — W050XXA Fall from non-moving wheelchair, initial encounter: Secondary | ICD-10-CM | POA: Diagnosis not present

## 2024-02-10 DIAGNOSIS — Z79899 Other long term (current) drug therapy: Secondary | ICD-10-CM | POA: Insufficient documentation

## 2024-02-10 DIAGNOSIS — T50916A Underdosing of multiple unspecified drugs, medicaments and biological substances, initial encounter: Secondary | ICD-10-CM | POA: Diagnosis not present

## 2024-02-10 DIAGNOSIS — W19XXXA Unspecified fall, initial encounter: Secondary | ICD-10-CM

## 2024-02-10 DIAGNOSIS — E119 Type 2 diabetes mellitus without complications: Secondary | ICD-10-CM | POA: Insufficient documentation

## 2024-02-10 DIAGNOSIS — M47812 Spondylosis without myelopathy or radiculopathy, cervical region: Secondary | ICD-10-CM | POA: Diagnosis not present

## 2024-02-10 DIAGNOSIS — Z9689 Presence of other specified functional implants: Secondary | ICD-10-CM | POA: Diagnosis not present

## 2024-02-10 DIAGNOSIS — R1110 Vomiting, unspecified: Secondary | ICD-10-CM | POA: Diagnosis present

## 2024-02-10 DIAGNOSIS — F1090 Alcohol use, unspecified, uncomplicated: Secondary | ICD-10-CM | POA: Diagnosis present

## 2024-02-10 DIAGNOSIS — Y92008 Other place in unspecified non-institutional (private) residence as the place of occurrence of the external cause: Secondary | ICD-10-CM | POA: Diagnosis not present

## 2024-02-10 DIAGNOSIS — R220 Localized swelling, mass and lump, head: Secondary | ICD-10-CM | POA: Diagnosis not present

## 2024-02-10 DIAGNOSIS — Y92009 Unspecified place in unspecified non-institutional (private) residence as the place of occurrence of the external cause: Secondary | ICD-10-CM | POA: Insufficient documentation

## 2024-02-10 DIAGNOSIS — M4312 Spondylolisthesis, cervical region: Secondary | ICD-10-CM | POA: Diagnosis not present

## 2024-02-10 DIAGNOSIS — Z91128 Patient's intentional underdosing of medication regimen for other reason: Secondary | ICD-10-CM | POA: Diagnosis not present

## 2024-02-10 DIAGNOSIS — G8929 Other chronic pain: Secondary | ICD-10-CM | POA: Insufficient documentation

## 2024-02-10 DIAGNOSIS — S199XXA Unspecified injury of neck, initial encounter: Secondary | ICD-10-CM | POA: Diagnosis not present

## 2024-02-10 DIAGNOSIS — R109 Unspecified abdominal pain: Secondary | ICD-10-CM | POA: Insufficient documentation

## 2024-02-10 LAB — TROPONIN I (HIGH SENSITIVITY): Troponin I (High Sensitivity): 7 ng/L (ref <18)

## 2024-02-10 LAB — CBC
HCT: 35.8 % — ABNORMAL LOW (ref 39.0–52.0)
Hemoglobin: 11.8 g/dL — ABNORMAL LOW (ref 13.0–17.0)
MCH: 30.7 pg (ref 26.0–34.0)
MCHC: 33 g/dL (ref 30.0–36.0)
MCV: 93.2 fL (ref 80.0–100.0)
Platelets: 183 K/uL (ref 150–400)
RBC: 3.84 MIL/uL — ABNORMAL LOW (ref 4.22–5.81)
RDW: 21.6 % — ABNORMAL HIGH (ref 11.5–15.5)
WBC: 4.2 K/uL (ref 4.0–10.5)
nRBC: 0 % (ref 0.0–0.2)

## 2024-02-10 LAB — COMPREHENSIVE METABOLIC PANEL WITH GFR
ALT: 58 U/L — ABNORMAL HIGH (ref 0–44)
AST: 153 U/L — ABNORMAL HIGH (ref 15–41)
Albumin: 2.8 g/dL — ABNORMAL LOW (ref 3.5–5.0)
Alkaline Phosphatase: 98 U/L (ref 38–126)
Anion gap: 13 (ref 5–15)
BUN: 10 mg/dL (ref 8–23)
CO2: 21 mmol/L — ABNORMAL LOW (ref 22–32)
Calcium: 7.7 mg/dL — ABNORMAL LOW (ref 8.9–10.3)
Chloride: 107 mmol/L (ref 98–111)
Creatinine, Ser: 0.72 mg/dL (ref 0.61–1.24)
GFR, Estimated: >60 mL/min (ref >60)
Glucose, Bld: 104 mg/dL — ABNORMAL HIGH (ref 70–99)
Potassium: 2.9 mmol/L — ABNORMAL LOW (ref 3.5–5.1)
Sodium: 141 mmol/L (ref 135–145)
Total Bilirubin: 0.4 mg/dL (ref 0.0–1.2)
Total Protein: 6.6 g/dL (ref 6.5–8.1)

## 2024-02-10 LAB — LIPASE, BLOOD: Lipase: 30 U/L (ref 11–51)

## 2024-02-10 LAB — MAGNESIUM: Magnesium: 1.9 mg/dL (ref 1.7–2.4)

## 2024-02-10 LAB — CK: Total CK: 47 U/L — ABNORMAL LOW (ref 49–397)

## 2024-02-10 LAB — ETHANOL: Alcohol, Ethyl (B): 300 mg/dL — ABNORMAL HIGH (ref <15)

## 2024-02-10 MED ORDER — POTASSIUM CHLORIDE 20 MEQ PO PACK
40.0000 meq | PACK | Freq: Once | ORAL | Status: AC
  Administered 2024-02-10: 40 meq via ORAL
  Filled 2024-02-10: qty 2

## 2024-02-10 MED ORDER — ACETAMINOPHEN 500 MG PO TABS
1000.0000 mg | ORAL_TABLET | Freq: Once | ORAL | Status: AC
  Administered 2024-02-10: 1000 mg via ORAL
  Filled 2024-02-10: qty 2

## 2024-02-10 MED ORDER — ONDANSETRON 4 MG PO TBDP
4.0000 mg | ORAL_TABLET | Freq: Once | ORAL | Status: AC
  Administered 2024-02-10: 4 mg via ORAL
  Filled 2024-02-10: qty 1

## 2024-02-10 MED ORDER — FAMOTIDINE 20 MG PO TABS
20.0000 mg | ORAL_TABLET | Freq: Once | ORAL | Status: AC
  Administered 2024-02-10: 20 mg via ORAL
  Filled 2024-02-10: qty 1

## 2024-02-10 NOTE — ED Provider Notes (Signed)
 [facility] Provider Note    Event Date/Time   First MD Initiated Contact with Patient 02/10/24 1502     (approximate)   History   Fall   HPI  Charles [name] is a 63 y.o. male past medical history significant for anxiety, chronic pain with a fentanyl  pump, hypertension, diabetes, who presents to the emergency department following a fall.  Patient states that he started to not feel well yesterday.  Patient is very short with responses.  States he does not feel well and had some vomiting.  Some abdominal pain that he does not describe well but just points to his entire abdomen.  Denies any constipation or diarrhea.  Normal bowel movements.  Denies any dysuria, urinary urgency or frequency.  Denies any fever or chills.  Patient states that he just wants to go home.  According to EMS patient had come from home and had fallen out of his wheelchair onto his wheelchair ramp.  Neighbors had called EMS.  Patient states that he was found outside and does not recall the events of being found outside.  States that he just wants to go home.  States that he stopped taking any home medications and has not been taking anything for the past 6 months.  States that he has a fentanyl  pump which he follows at [facility] for.  Normal anticoagulation.  Denies any significant headache or change in vision.  Denies neck pain.  Ambulates with in a wheelchair at baseline.     Physical Exam   Triage Vital Signs: ED Triage Vitals  Encounter Vitals Group     BP 02/10/24 1455 101/65     Girls Systolic BP Percentile --      Girls Diastolic BP Percentile --      Boys Systolic BP Percentile --      Boys Diastolic BP Percentile --      Pulse Rate 02/10/24 1455 75     Resp 02/10/24 1455 18     Temp 02/10/24 1455 (!) 97.5 F (36.4 C)     Temp Source 02/10/24 1455 Oral     SpO2 02/10/24 1455 95 %     Weight --      Height --      Head Circumference --      Peak Flow --      Pain Score  02/10/24 1453 0     Pain Loc --      Pain Education --      Exclude from Growth Chart --     Most recent vital signs: Vitals:   02/10/24 1455 02/10/24 1931  BP: 101/65 110/74  Pulse: 75 87  Resp: 18 16  Temp: (!) 97.5 F (36.4 C) 97.7 F (36.5 C)  SpO2: 95% 98%    Physical Exam Constitutional:      Appearance: He is well-developed. He is obese.  HENT:     Head: Atraumatic.  Eyes:     Conjunctiva/sclera: Conjunctivae normal.  Cardiovascular:     Rate and Rhythm: Regular rhythm.  Pulmonary:     Effort: No respiratory distress.  Abdominal:     Tenderness: There is abdominal tenderness.     Comments: Abdominal scar.  No significant abdominal tenderness to palpation.  No rebound or guarding.  Palpable fentanyl  pump to his right side of his abdomen.  Musculoskeletal:     Cervical back: Normal range of motion.     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  Right  lower extremity with no edema.  No obvious wounds No midline thoracic or lumbar tenderness to palpation.  No tenderness to bilateral pelvis.  Skin:    General: Skin is warm.     Capillary Refill: Capillary refill takes less than 2 seconds.  Neurological:     Mental Status: He is alert. Mental status is at baseline.     Comments: Alert and oriented x 4.  Answering questions appropriately.  Does appear agitated.  Able to move bilateral upper extremities with good grip strength and equal strength.  Normal sensation to upper extremities.  Normal strength to lower extremity.     IMPRESSION / MDM / ASSESSMENT AND PLAN / ED COURSE  I reviewed the triage vital signs and the nursing notes.  Differential diagnosis including syncope, dysrhythmia, electrolyte abnormality, intracranial hemorrhage, concussion, cervical spine fracture, ACS, urinary tract infection, rhabdomyolysis  EKG  I, Clotilda Punter, the attending physician, personally viewed and interpreted this ECG.  EKG showed normal sinus rhythm.  Low voltage.  No  significant ST elevation or depression.  No findings of acute ischemia or dysrhythmia.  No significant change when compared to prior EKG  No tachycardic or bradycardic dysrhythmias while on cardiac telemetry.  RADIOLOGY CT scan of the head with no signs of intracranial hemorrhage.  Soft tissue swelling.  CT scan cervical spine -no acute findings related to cervical trauma  Chest x-ray -no signs of pneumonia  LABS (all labs ordered are listed, but only abnormal results are displayed) Labs interpreted as -    Labs Reviewed  CBC - Abnormal; Notable for the following components:      Result Value   RBC 3.84 (*)    Hemoglobin 11.8 (*)    HCT 35.8 (*)    RDW 21.6 (*)    All other components within normal limits  COMPREHENSIVE METABOLIC PANEL WITH GFR - Abnormal; Notable for the following components:   Potassium 2.9 (*)    CO2 21 (*)    Glucose, Bld 104 (*)    Calcium  7.7 (*)    Albumin  2.8 (*)    AST 153 (*)    ALT 58 (*)    All other components within normal limits  ETHANOL - Abnormal; Notable for the following components:   Alcohol , Ethyl (B) 300 (*)    All other components within normal limits  CK - Abnormal; Notable for the following components:   Total CK 47 (*)    All other components within normal limits  LIPASE, BLOOD  MAGNESIUM   TROPONIN I (HIGH SENSITIVITY)     MDM  On arrival afebrile, temperature 97.5, normotensive and 95% on room air.  Lab work with significantly elevated alcohol  level in the 300s.  No other findings consistent with trauma.  Potassium low at 2.9.  Magnesium  1.9.  No signs of rhabdomyolysis.  Clinical Course as of 02/11/24 0118  Thu Feb 10, 2024  8165 On reevaluation patient appears clinically sober.  Is now having a normal speech, interacting and moving all extremities equally.  No significant headache and no focal neurologic deficits.  Tolerated p.o. with potassium replacement.  No signs of rhabdomyolysis. [SM]    Clinical Course User  Index [SM] Punter Clotilda, MD   Patient had a friend come pick him up from the emergency department.  Patient has a nonfocal exam and states she is feeling much better.  Would want outpatient resources and was given information information for detox and rehab.  Given information to establish care with a  primary care provider and placed a referral.  Discussed return precautions for any ongoing or worsening symptoms.  PROCEDURES:  Critical Care performed: No  Procedures  Patient's presentation is most consistent with acute presentation with potential threat to life or bodily function.   MEDICATIONS ORDERED IN ED: Medications  ondansetron  (ZOFRAN -ODT) disintegrating tablet 4 mg (4 mg Oral Given 02/10/24 1542)  famotidine  (PEPCID ) tablet 20 mg (20 mg Oral Given 02/10/24 1542)  acetaminophen  (TYLENOL ) tablet 1,000 mg (1,000 mg Oral Given 02/10/24 1541)  potassium chloride  (KLOR-CON ) packet 40 mEq (40 mEq Oral Given 02/10/24 1818)    FINAL CLINICAL IMPRESSION(S) / ED DIAGNOSES   Final diagnoses:  Fall, initial encounter  Injury of head, initial encounter  Acute alcoholic intoxication without complication     Rx / DC Orders   ED Discharge Orders          Ordered    Ambulatory Referral to Primary Care (Establish Care)        02/10/24 1901             Note:  This document was prepared using Dragon voice recognition software and may include unintentional dictation errors.   Suzanne Kirsch, MD 02/11/24 [id number]

## 2024-02-10 NOTE — ED Notes (Signed)
 PT  VOL

## 2024-02-10 NOTE — ED Triage Notes (Signed)
 Pt via [facility] from home. Pt fell out of his wheelchair, onto the wheelchair ramp. + head injury. Denies any blood thinners. + ETOH per EMS. Neighbors called EMS on the pt. Pt is alert but not wanting to answering questions.

## 2024-02-10 NOTE — Discharge Instructions (Signed)
 You were seen in the emergency department following a fall.  Your alcohol  level was very elevated.  You had a CT scan of your head and neck that did not show any broken bones or internal bleeding.  Your heart enzyme and EKG did not show any signs of a heart attack.  Your potassium was mildly low when checked today.  Make sure to follow up with a primary doctor to follow up your labs.  Make sure to eat food high in potassium and magnesium  - examples - potatoes, spinach, bananas, beans, avocadoes, oranges, nuts.  You are given information to follow-up as an outpatient and given resources for rehab and to establish care with a primary care physician so you can restart your home medications.  Return to the emergency department to be of any worsening symptoms or any new symptoms.

## 2024-03-02 NOTE — Progress Notes (Deleted)
  Cardiology Office Note   Date:  03/02/2024  ID:  Charles [name], DOB [birth year], MRN [id number] PCP: Pcp, No  [facility] Providers Cardiologist:  None { Click to update primary MD,subspecialty MD or APP then REFRESH:1}    History of Present Illness Charles [name] is a 63 y.o. male PMH COPD, polysubstance abuse, HTN, HLD, DM 2, CAD with PCI to proximal LAD 06/2015 who presents for further evaluation management of chest discomfort.  Patient seen in the ED 02/01/2024 for chest pain.  He had 2 to 3 days of chest pain during evaluation.  His pain spontaneously resolved.  EtOH was 300, serial troponin negative, CMP noted for hypokalemia to 2.9, CBC at baseline.  Relevant CVD History -TTE 10/2020 normal biventricular function without significant valvular disease -TTE 02/2019 reportedly normal LV function with LVH (no full report available) - SPECT 11/2016 with mild apical defect and EF 30 to 45% - Cath 06/2016 with patent proximal LAD stent - TTE 06/2015 LVEF 50%, grade 1 diastolic dysfunction, no significant valvular disease - Cath 06/2015 90% proximal LAD stenosis treated with PCI   ROS: Pt denies any chest discomfort, jaw pain, arm pain, palpitations, syncope, presyncope, orthopnea, PND, or LE edema.  Studies Reviewed I have independently reviewed the patient's ECG, ***.  Physical Exam VS:  There were no vitals taken for this visit.       Wt Readings from Last 3 Encounters:  02/03/24 260 lb (117.9 kg)  02/01/24 251 lb 5.2 oz (114 kg)  01/14/24 250 lb (113.4 kg)    GEN: No acute distress. NECK: No JVD; No carotid bruits. CARDIAC: ***RRR, no murmurs, rubs, gallops. RESPIRATORY:  Clear to auscultation. EXTREMITIES:  Warm and well-perfused. No edema.  ASSESSMENT AND PLAN Chest discomfort CAD status post PCI proximal LAD 2017        {Are you ordering a CV Procedure (e.g. stress test, cath, DCCV, TEE, etc)?   Press F2        :[id number]}  Dispo: ***  Signed, Caron Poser, MD

## 2024-03-03 ENCOUNTER — Ambulatory Visit

## 2024-03-28 ENCOUNTER — Ambulatory Visit: Payer: Self-pay

## 2024-03-28 NOTE — Telephone Encounter (Signed)
 FYI Only or Action Required?: FYI only for provider: appointment scheduled on 07/14/24.  Patient was last seen in primary care on Not yet established.  Called Nurse Triage reporting Depression.  Symptoms began several years ago.  Interventions attempted: Other: Out of Medication for 8 months.  Symptoms are: stable.  Triage Disposition: Home Care  Patient/caregiver understands and will follow disposition?: Yes Reason for Disposition  [1] Depression AND [2] NO worsening or change  Answer Assessment - Initial Assessment Questions Junette with [facility] also on the line with patient. Patient has hx of depression, was taking Duloextine but has not taken in about 8 months due to not have a provider anymore. Patient wants to establish care at [facility]. Scheduled new patient appointment for 07/14/24, added to wait list. Patient given information to [facility]. Patient declined info to [facility] as its too far away.  1. CONCERN: What happened that made you call today?     Wife and dog passed away, leg amputated, in a wheelchair, lives alone  2. DEPRESSION SYMPTOM SCREENING: How are you feeling overall? (e.g., decreased energy, increased sleeping or difficulty sleeping, difficulty concentrating, feelings of sadness, guilt, hopelessness, or worthlessness)     Trouble sleeping, bored, lonely  3. RISK OF HARM - SUICIDAL IDEATION:  Do you ever have thoughts of hurting or killing yourself?  (e.g., yes, no, no but preoccupation with thoughts about death)     Denies  4. RISK OF HARM - HOMICIDAL IDEATION:  Do you ever have thoughts of hurting or killing someone else?  (e.g., yes, no, no but preoccupation with thoughts about death)     Denies  5. SUPPORT: Who is with you now? Who do you live with? Do you have family or friends who you can talk to?      No  6. THERAPIST: Do you have a counselor or therapist? If  Yes, ask: What is their name?     Denies  7. ALCOHOL  USE OR SUBSTANCE USE (DRUG USE): Do you drink alcohol  or use any illegal drugs?     Will drink a beer every once in a while, but not every day  8. OTHER: Do you have any other physical symptoms right now? (e.g., fever)       Denies  Protocols used: Depression-A-AH  Copied from CRM #[id number]. Topic: Clinical - Red Word Triage >> Mar 28, 2024 10:39 AM Joesph NOVAK wrote: Red Word that prompted transfer to Nurse Triage: Depression

## 2024-07-14 ENCOUNTER — Ambulatory Visit: Admitting: Nurse Practitioner
